# Patient Record
Sex: Male | Born: 1935 | Race: White | Hispanic: No | Marital: Married | Smoking: Former smoker
Health system: Southern US, Community
[De-identification: ages and names within clinical notes are randomized; demographics above are authoritative.]

## PROBLEM LIST (undated history)

## (undated) DIAGNOSIS — J449 Chronic obstructive pulmonary disease, unspecified: Secondary | ICD-10-CM

## (undated) DIAGNOSIS — I872 Venous insufficiency (chronic) (peripheral): Secondary | ICD-10-CM

## (undated) DIAGNOSIS — I5189 Other ill-defined heart diseases: Secondary | ICD-10-CM

## (undated) DIAGNOSIS — M1990 Unspecified osteoarthritis, unspecified site: Secondary | ICD-10-CM

## (undated) DIAGNOSIS — K635 Polyp of colon: Secondary | ICD-10-CM

## (undated) DIAGNOSIS — R7611 Nonspecific reaction to tuberculin skin test without active tuberculosis: Secondary | ICD-10-CM

## (undated) DIAGNOSIS — K922 Gastrointestinal hemorrhage, unspecified: Secondary | ICD-10-CM

## (undated) DIAGNOSIS — J209 Acute bronchitis, unspecified: Secondary | ICD-10-CM

## (undated) DIAGNOSIS — G4733 Obstructive sleep apnea (adult) (pediatric): Secondary | ICD-10-CM

## (undated) DIAGNOSIS — I4892 Unspecified atrial flutter: Secondary | ICD-10-CM

## (undated) DIAGNOSIS — K219 Gastro-esophageal reflux disease without esophagitis: Secondary | ICD-10-CM

## (undated) DIAGNOSIS — K2950 Unspecified chronic gastritis without bleeding: Secondary | ICD-10-CM

## (undated) DIAGNOSIS — D649 Anemia, unspecified: Secondary | ICD-10-CM

## (undated) DIAGNOSIS — K5730 Diverticulosis of large intestine without perforation or abscess without bleeding: Secondary | ICD-10-CM

## (undated) DIAGNOSIS — I10 Essential (primary) hypertension: Secondary | ICD-10-CM

## (undated) DIAGNOSIS — D126 Benign neoplasm of colon, unspecified: Secondary | ICD-10-CM

## (undated) DIAGNOSIS — R0602 Shortness of breath: Secondary | ICD-10-CM

## (undated) DIAGNOSIS — I4891 Unspecified atrial fibrillation: Secondary | ICD-10-CM

## (undated) DIAGNOSIS — E039 Hypothyroidism, unspecified: Secondary | ICD-10-CM

## (undated) DIAGNOSIS — I351 Nonrheumatic aortic (valve) insufficiency: Secondary | ICD-10-CM

## (undated) HISTORY — DX: Acute bronchitis, unspecified: J20.9

## (undated) HISTORY — PX: TOTAL KNEE ARTHROPLASTY: SHX125

## (undated) HISTORY — PX: NASAL SINUS SURGERY: SHX719

## (undated) HISTORY — DX: Unspecified chronic gastritis without bleeding: K29.50

## (undated) HISTORY — DX: Anemia, unspecified: D64.9

## (undated) HISTORY — DX: Nonrheumatic aortic (valve) insufficiency: I35.1

## (undated) HISTORY — DX: Unspecified osteoarthritis, unspecified site: M19.90

## (undated) HISTORY — DX: Obstructive sleep apnea (adult) (pediatric): G47.33

## (undated) HISTORY — DX: Benign neoplasm of colon, unspecified: D12.6

## (undated) HISTORY — DX: Polyp of colon: K63.5

## (undated) HISTORY — PX: COLONOSCOPY: SHX174

## (undated) HISTORY — DX: Nonspecific reaction to tuberculin skin test without active tuberculosis: R76.11

## (undated) HISTORY — DX: Gastrointestinal hemorrhage, unspecified: K92.2

## (undated) HISTORY — DX: Gastro-esophageal reflux disease without esophagitis: K21.9

## (undated) HISTORY — DX: Essential (primary) hypertension: I10

## (undated) HISTORY — DX: Venous insufficiency (chronic) (peripheral): I87.2

## (undated) HISTORY — DX: Hypothyroidism, unspecified: E03.9

## (undated) HISTORY — DX: Unspecified atrial fibrillation: I48.91

## (undated) HISTORY — PX: HERNIA REPAIR: SHX51

## (undated) HISTORY — DX: Other ill-defined heart diseases: I51.89

## (undated) HISTORY — DX: Diverticulosis of large intestine without perforation or abscess without bleeding: K57.30

## (undated) HISTORY — PX: SHOULDER ARTHROSCOPY: SHX128

## (undated) HISTORY — DX: Unspecified atrial flutter: I48.92

## (undated) HISTORY — PX: TONSILLECTOMY: SHX5217

## (undated) HISTORY — PX: POLYPECTOMY: SHX149

## (undated) HISTORY — PX: TOE SURGERY: SHX1073

---

## 1997-08-25 ENCOUNTER — Ambulatory Visit (HOSPITAL_COMMUNITY): Admission: RE | Admit: 1997-08-25 | Discharge: 1997-08-25 | Payer: Self-pay | Admitting: Internal Medicine

## 1998-09-18 ENCOUNTER — Encounter: Payer: Self-pay | Admitting: Orthopedic Surgery

## 1998-09-19 ENCOUNTER — Encounter: Payer: Self-pay | Admitting: Orthopedic Surgery

## 1998-09-20 ENCOUNTER — Encounter: Payer: Self-pay | Admitting: Orthopedic Surgery

## 1998-09-20 ENCOUNTER — Inpatient Hospital Stay (HOSPITAL_COMMUNITY): Admission: RE | Admit: 1998-09-20 | Discharge: 1998-09-25 | Payer: Self-pay | Admitting: Orthopedic Surgery

## 1998-10-29 ENCOUNTER — Encounter: Admission: RE | Admit: 1998-10-29 | Discharge: 1998-12-17 | Payer: Self-pay | Admitting: Orthopedic Surgery

## 1999-06-24 HISTORY — PX: JOINT REPLACEMENT: SHX530

## 1999-08-09 ENCOUNTER — Encounter: Payer: Self-pay | Admitting: Orthopedic Surgery

## 1999-08-09 ENCOUNTER — Inpatient Hospital Stay (HOSPITAL_COMMUNITY): Admission: RE | Admit: 1999-08-09 | Discharge: 1999-08-14 | Payer: Self-pay | Admitting: Orthopedic Surgery

## 1999-09-04 ENCOUNTER — Encounter: Admission: RE | Admit: 1999-09-04 | Discharge: 1999-10-24 | Payer: Self-pay | Admitting: Orthopedic Surgery

## 2000-01-02 ENCOUNTER — Ambulatory Visit (HOSPITAL_BASED_OUTPATIENT_CLINIC_OR_DEPARTMENT_OTHER): Admission: RE | Admit: 2000-01-02 | Discharge: 2000-01-02 | Payer: Self-pay | Admitting: Orthopedic Surgery

## 2000-01-23 ENCOUNTER — Encounter: Payer: Self-pay | Admitting: Gastroenterology

## 2000-01-24 ENCOUNTER — Encounter (INDEPENDENT_AMBULATORY_CARE_PROVIDER_SITE_OTHER): Payer: Self-pay | Admitting: Specialist

## 2000-01-24 ENCOUNTER — Other Ambulatory Visit: Admission: RE | Admit: 2000-01-24 | Discharge: 2000-01-24 | Payer: Self-pay | Admitting: Gastroenterology

## 2000-11-20 ENCOUNTER — Encounter: Payer: Self-pay | Admitting: Internal Medicine

## 2000-11-20 ENCOUNTER — Ambulatory Visit (HOSPITAL_COMMUNITY): Admission: RE | Admit: 2000-11-20 | Discharge: 2000-11-20 | Payer: Self-pay | Admitting: Internal Medicine

## 2001-06-23 DIAGNOSIS — K635 Polyp of colon: Secondary | ICD-10-CM

## 2001-06-23 HISTORY — DX: Polyp of colon: K63.5

## 2001-12-31 ENCOUNTER — Encounter: Payer: Self-pay | Admitting: Urology

## 2002-01-07 ENCOUNTER — Inpatient Hospital Stay (HOSPITAL_COMMUNITY): Admission: RE | Admit: 2002-01-07 | Discharge: 2002-01-09 | Payer: Self-pay | Admitting: Urology

## 2002-01-07 ENCOUNTER — Encounter (INDEPENDENT_AMBULATORY_CARE_PROVIDER_SITE_OTHER): Payer: Self-pay | Admitting: Specialist

## 2002-02-16 ENCOUNTER — Encounter: Payer: Self-pay | Admitting: Gastroenterology

## 2002-04-05 ENCOUNTER — Encounter: Payer: Self-pay | Admitting: Gastroenterology

## 2002-04-05 ENCOUNTER — Ambulatory Visit (HOSPITAL_COMMUNITY): Admission: RE | Admit: 2002-04-05 | Discharge: 2002-04-05 | Payer: Self-pay | Admitting: Gastroenterology

## 2002-12-09 ENCOUNTER — Encounter: Payer: Self-pay | Admitting: Internal Medicine

## 2002-12-09 ENCOUNTER — Ambulatory Visit (HOSPITAL_COMMUNITY): Admission: RE | Admit: 2002-12-09 | Discharge: 2002-12-09 | Payer: Self-pay | Admitting: Internal Medicine

## 2003-01-10 ENCOUNTER — Encounter (INDEPENDENT_AMBULATORY_CARE_PROVIDER_SITE_OTHER): Payer: Self-pay | Admitting: Specialist

## 2003-01-10 ENCOUNTER — Ambulatory Visit (HOSPITAL_COMMUNITY): Admission: RE | Admit: 2003-01-10 | Discharge: 2003-01-10 | Payer: Self-pay | Admitting: Surgery

## 2003-07-03 ENCOUNTER — Encounter: Admission: RE | Admit: 2003-07-03 | Discharge: 2003-07-03 | Payer: Self-pay | Admitting: Neurology

## 2003-08-04 ENCOUNTER — Ambulatory Visit (HOSPITAL_COMMUNITY): Admission: RE | Admit: 2003-08-04 | Discharge: 2003-08-04 | Payer: Self-pay | Admitting: Neurology

## 2003-09-11 ENCOUNTER — Ambulatory Visit (HOSPITAL_COMMUNITY): Admission: RE | Admit: 2003-09-11 | Discharge: 2003-09-11 | Payer: Self-pay | Admitting: Internal Medicine

## 2003-09-25 ENCOUNTER — Ambulatory Visit (HOSPITAL_COMMUNITY): Admission: RE | Admit: 2003-09-25 | Discharge: 2003-09-25 | Payer: Self-pay | Admitting: Internal Medicine

## 2004-01-11 ENCOUNTER — Ambulatory Visit (HOSPITAL_COMMUNITY): Admission: RE | Admit: 2004-01-11 | Discharge: 2004-01-11 | Payer: Self-pay | Admitting: Internal Medicine

## 2004-01-17 ENCOUNTER — Inpatient Hospital Stay (HOSPITAL_BASED_OUTPATIENT_CLINIC_OR_DEPARTMENT_OTHER): Admission: RE | Admit: 2004-01-17 | Discharge: 2004-01-17 | Payer: Self-pay | Admitting: Cardiology

## 2004-01-17 HISTORY — PX: CARDIAC CATHETERIZATION: SHX172

## 2004-12-11 ENCOUNTER — Ambulatory Visit (HOSPITAL_COMMUNITY): Admission: RE | Admit: 2004-12-11 | Discharge: 2004-12-11 | Payer: Self-pay | Admitting: Internal Medicine

## 2005-01-21 ENCOUNTER — Ambulatory Visit: Payer: Self-pay | Admitting: Gastroenterology

## 2005-02-25 ENCOUNTER — Ambulatory Visit: Payer: Self-pay | Admitting: Gastroenterology

## 2005-03-04 ENCOUNTER — Ambulatory Visit: Payer: Self-pay | Admitting: Gastroenterology

## 2005-03-04 ENCOUNTER — Encounter (INDEPENDENT_AMBULATORY_CARE_PROVIDER_SITE_OTHER): Payer: Self-pay | Admitting: Specialist

## 2005-03-19 ENCOUNTER — Ambulatory Visit: Payer: Self-pay | Admitting: Gastroenterology

## 2006-07-20 ENCOUNTER — Ambulatory Visit (HOSPITAL_COMMUNITY): Admission: RE | Admit: 2006-07-20 | Discharge: 2006-07-20 | Payer: Self-pay | Admitting: Internal Medicine

## 2006-09-28 ENCOUNTER — Ambulatory Visit (HOSPITAL_COMMUNITY): Admission: RE | Admit: 2006-09-28 | Discharge: 2006-09-28 | Payer: Self-pay | Admitting: Internal Medicine

## 2006-10-12 ENCOUNTER — Ambulatory Visit (HOSPITAL_COMMUNITY): Admission: RE | Admit: 2006-10-12 | Discharge: 2006-10-12 | Payer: Self-pay | Admitting: Internal Medicine

## 2006-11-09 ENCOUNTER — Encounter: Payer: Self-pay | Admitting: Emergency Medicine

## 2008-08-08 ENCOUNTER — Encounter: Admission: RE | Admit: 2008-08-08 | Discharge: 2008-08-08 | Payer: Self-pay | Admitting: Otolaryngology

## 2008-09-01 ENCOUNTER — Ambulatory Visit (HOSPITAL_COMMUNITY): Admission: RE | Admit: 2008-09-01 | Discharge: 2008-09-01 | Payer: Self-pay | Admitting: Otolaryngology

## 2008-09-01 ENCOUNTER — Encounter: Admission: RE | Admit: 2008-09-01 | Discharge: 2008-09-01 | Payer: Self-pay | Admitting: Otolaryngology

## 2008-09-08 DIAGNOSIS — K219 Gastro-esophageal reflux disease without esophagitis: Secondary | ICD-10-CM

## 2008-09-08 DIAGNOSIS — N138 Other obstructive and reflux uropathy: Secondary | ICD-10-CM | POA: Insufficient documentation

## 2008-09-08 DIAGNOSIS — I10 Essential (primary) hypertension: Secondary | ICD-10-CM

## 2008-09-08 DIAGNOSIS — N401 Enlarged prostate with lower urinary tract symptoms: Secondary | ICD-10-CM

## 2008-09-11 ENCOUNTER — Encounter: Payer: Self-pay | Admitting: Internal Medicine

## 2008-09-11 ENCOUNTER — Ambulatory Visit: Payer: Self-pay

## 2008-09-11 ENCOUNTER — Ambulatory Visit: Payer: Self-pay | Admitting: Internal Medicine

## 2008-09-11 DIAGNOSIS — I4891 Unspecified atrial fibrillation: Secondary | ICD-10-CM

## 2008-10-17 ENCOUNTER — Encounter (INDEPENDENT_AMBULATORY_CARE_PROVIDER_SITE_OTHER): Payer: Self-pay | Admitting: Otolaryngology

## 2008-10-17 ENCOUNTER — Ambulatory Visit (HOSPITAL_BASED_OUTPATIENT_CLINIC_OR_DEPARTMENT_OTHER): Admission: RE | Admit: 2008-10-17 | Discharge: 2008-10-18 | Payer: Self-pay | Admitting: Otolaryngology

## 2008-11-10 ENCOUNTER — Ambulatory Visit: Payer: Self-pay | Admitting: Internal Medicine

## 2009-01-12 ENCOUNTER — Ambulatory Visit (HOSPITAL_COMMUNITY): Admission: RE | Admit: 2009-01-12 | Discharge: 2009-01-12 | Payer: Self-pay | Admitting: Internal Medicine

## 2009-02-19 ENCOUNTER — Inpatient Hospital Stay (HOSPITAL_COMMUNITY): Admission: EM | Admit: 2009-02-19 | Discharge: 2009-02-21 | Payer: Self-pay | Admitting: Emergency Medicine

## 2009-02-19 ENCOUNTER — Ambulatory Visit: Payer: Self-pay | Admitting: Internal Medicine

## 2009-02-21 ENCOUNTER — Encounter: Payer: Self-pay | Admitting: Gastroenterology

## 2009-02-23 ENCOUNTER — Encounter: Payer: Self-pay | Admitting: Gastroenterology

## 2009-03-14 ENCOUNTER — Encounter (INDEPENDENT_AMBULATORY_CARE_PROVIDER_SITE_OTHER): Payer: Self-pay | Admitting: *Deleted

## 2009-03-15 ENCOUNTER — Ambulatory Visit: Payer: Self-pay | Admitting: Internal Medicine

## 2009-04-19 ENCOUNTER — Ambulatory Visit: Payer: Self-pay | Admitting: Internal Medicine

## 2009-07-17 ENCOUNTER — Telehealth: Payer: Self-pay | Admitting: Internal Medicine

## 2009-07-25 ENCOUNTER — Inpatient Hospital Stay (HOSPITAL_COMMUNITY): Admission: EM | Admit: 2009-07-25 | Discharge: 2009-07-27 | Payer: Self-pay | Admitting: Emergency Medicine

## 2009-07-25 ENCOUNTER — Ambulatory Visit: Payer: Self-pay | Admitting: Internal Medicine

## 2009-08-09 ENCOUNTER — Encounter: Payer: Self-pay | Admitting: Internal Medicine

## 2009-09-10 ENCOUNTER — Ambulatory Visit: Payer: Self-pay | Admitting: Gastroenterology

## 2009-09-10 DIAGNOSIS — Z860100 Personal history of colon polyps, unspecified: Secondary | ICD-10-CM | POA: Insufficient documentation

## 2009-09-10 DIAGNOSIS — K5731 Diverticulosis of large intestine without perforation or abscess with bleeding: Secondary | ICD-10-CM

## 2009-10-04 ENCOUNTER — Ambulatory Visit: Payer: Self-pay | Admitting: Emergency Medicine

## 2009-10-04 ENCOUNTER — Ambulatory Visit: Payer: Self-pay | Admitting: Internal Medicine

## 2009-10-04 DIAGNOSIS — I5032 Chronic diastolic (congestive) heart failure: Secondary | ICD-10-CM

## 2009-10-04 DIAGNOSIS — G4733 Obstructive sleep apnea (adult) (pediatric): Secondary | ICD-10-CM

## 2009-10-04 DIAGNOSIS — J449 Chronic obstructive pulmonary disease, unspecified: Secondary | ICD-10-CM

## 2009-10-08 ENCOUNTER — Encounter: Payer: Self-pay | Admitting: Emergency Medicine

## 2009-10-23 ENCOUNTER — Ambulatory Visit: Payer: Self-pay | Admitting: Cardiology

## 2009-10-23 ENCOUNTER — Encounter: Payer: Self-pay | Admitting: Internal Medicine

## 2009-10-23 ENCOUNTER — Ambulatory Visit: Payer: Self-pay

## 2009-10-23 ENCOUNTER — Ambulatory Visit (HOSPITAL_COMMUNITY): Admission: RE | Admit: 2009-10-23 | Discharge: 2009-10-23 | Payer: Self-pay | Admitting: Internal Medicine

## 2009-11-01 ENCOUNTER — Ambulatory Visit: Payer: Self-pay | Admitting: Emergency Medicine

## 2009-11-01 DIAGNOSIS — J449 Chronic obstructive pulmonary disease, unspecified: Secondary | ICD-10-CM

## 2009-11-12 ENCOUNTER — Telehealth: Payer: Self-pay | Admitting: Emergency Medicine

## 2009-11-29 ENCOUNTER — Ambulatory Visit: Payer: Self-pay | Admitting: Internal Medicine

## 2009-12-11 ENCOUNTER — Encounter: Payer: Self-pay | Admitting: Emergency Medicine

## 2009-12-14 ENCOUNTER — Ambulatory Visit: Payer: Self-pay | Admitting: Emergency Medicine

## 2009-12-17 ENCOUNTER — Encounter: Payer: Self-pay | Admitting: Emergency Medicine

## 2009-12-31 ENCOUNTER — Telehealth (INDEPENDENT_AMBULATORY_CARE_PROVIDER_SITE_OTHER): Payer: Self-pay | Admitting: *Deleted

## 2010-05-28 ENCOUNTER — Ambulatory Visit: Payer: Self-pay | Admitting: Emergency Medicine

## 2010-07-01 ENCOUNTER — Encounter: Payer: Self-pay | Admitting: Internal Medicine

## 2010-07-01 ENCOUNTER — Ambulatory Visit
Admission: RE | Admit: 2010-07-01 | Discharge: 2010-07-01 | Payer: Self-pay | Source: Home / Self Care | Attending: Internal Medicine | Admitting: Internal Medicine

## 2010-07-01 ENCOUNTER — Other Ambulatory Visit: Payer: Self-pay | Admitting: Internal Medicine

## 2010-07-01 LAB — CBC WITH DIFFERENTIAL/PLATELET
Basophils Absolute: 0.1 10*3/uL (ref 0.0–0.1)
Basophils Relative: 0.8 % (ref 0.0–3.0)
Eosinophils Absolute: 0.4 10*3/uL (ref 0.0–0.7)
Eosinophils Relative: 5.3 % — ABNORMAL HIGH (ref 0.0–5.0)
HCT: 42.6 % (ref 39.0–52.0)
Hemoglobin: 14.5 g/dL (ref 13.0–17.0)
Lymphocytes Relative: 19.8 % (ref 12.0–46.0)
Lymphs Abs: 1.5 10*3/uL (ref 0.7–4.0)
MCHC: 34 g/dL (ref 30.0–36.0)
MCV: 92 fl (ref 78.0–100.0)
Monocytes Absolute: 0.8 10*3/uL (ref 0.1–1.0)
Monocytes Relative: 10.1 % (ref 3.0–12.0)
Neutro Abs: 5 10*3/uL (ref 1.4–7.7)
Neutrophils Relative %: 64 % (ref 43.0–77.0)
Platelets: 211 10*3/uL (ref 150.0–400.0)
RBC: 4.63 Mil/uL (ref 4.22–5.81)
RDW: 12.9 % (ref 11.5–14.6)
WBC: 7.7 10*3/uL (ref 4.5–10.5)

## 2010-07-01 LAB — BASIC METABOLIC PANEL
BUN: 19 mg/dL (ref 6–23)
CO2: 28 mEq/L (ref 19–32)
Calcium: 9.3 mg/dL (ref 8.4–10.5)
Chloride: 101 mEq/L (ref 96–112)
Creatinine, Ser: 0.7 mg/dL (ref 0.4–1.5)
GFR: 110.55 mL/min (ref >60.00)
Glucose, Bld: 81 mg/dL (ref 70–99)
Potassium: 4.2 mEq/L (ref 3.5–5.1)
Sodium: 140 mEq/L (ref 135–145)

## 2010-07-13 ENCOUNTER — Encounter: Payer: Self-pay | Admitting: Internal Medicine

## 2010-07-13 ENCOUNTER — Encounter: Payer: Self-pay | Admitting: Neurology

## 2010-07-14 ENCOUNTER — Encounter: Payer: Self-pay | Admitting: Internal Medicine

## 2010-07-21 LAB — CONVERTED CEMR LAB
CO2: 29 meq/L (ref 19–32)
Glucose, Bld: 85 mg/dL (ref 70–99)
Potassium: 4.2 meq/L (ref 3.5–5.1)
Sodium: 138 meq/L (ref 135–145)

## 2010-07-23 NOTE — Miscellaneous (Signed)
Summary: Orders Update  Clinical Lists Changes  Medications: Changed medication from FLUTICASONE PROPIONATE 50 MCG/ACT SUSP (FLUTICASONE PROPIONATE) UAD to FLUTICASONE PROPIONATE 50 MCG/ACT SUSP (FLUTICASONE PROPIONATE) 2 sprays each nostril two times a day - Signed Added new medication of SYMBICORT 160-4.5 MCG/ACT AERO (BUDESONIDE-FORMOTEROL FUMARATE) Rx of FLUTICASONE PROPIONATE 50 MCG/ACT SUSP (FLUTICASONE PROPIONATE) 2 sprays each nostril two times a day;  #3 x 3;  Signed;  Entered by: Michel Bickers CMA;  Authorized by: Leslye Peer MD;  Method used: Printed then faxed to [facility] [place], , , [place]  , Ph: [phone], Fax: [phone] Rx of SYMBICORT 160-4.5 MCG/ACT AERO (BUDESONIDE-FORMOTEROL FUMARATE) 2 puffs two times a day;  #1 x 4;  Signed;  Entered by: Michel Bickers CMA;  Authorized by: Leslye Peer MD;  Method used: Printed then faxed to [facility] [place], , , [place]  , Ph: [phone], Fax: [phone]    Prescriptions: SYMBICORT 160-4.5 MCG/ACT AERO (BUDESONIDE-FORMOTEROL FUMARATE) 2 puffs two times a day  #1 x 4   Entered by:   Michel Bickers CMA   Authorized by:   Leslye Peer MD   Signed by:   Michel Bickers CMA on 12/17/2009   Method used:   Printed then faxed to ...       [facility] [place] (mail-order)             , [place]         Ph: [phone]       Fax: [phone]   RxID:   [id number] FLUTICASONE PROPIONATE 50 MCG/ACT SUSP (FLUTICASONE PROPIONATE) 2 sprays each nostril two times a day  #3 x 3   Entered by:   Michel Bickers CMA   Authorized by:   Leslye Peer MD   Signed by:   Michel Bickers CMA on 12/17/2009   Method used:   Printed then faxed to ...       [facility] [place] (mail-order)             , [place]         Ph: [phone]       Fax: [phone]   RxID:   [id number]

## 2010-07-23 NOTE — Procedures (Signed)
Summary: EGD   EGD  Procedure date:  02/16/2002  Findings:      Findings: Gastritis  Findings: Ulcer  [facility]   Patient Name: Richard Rosales, Richard Rosales. MRN:  Procedure Procedures: Panendoscopy (EGD) CPT: 43235.    with biopsy(s)/brushing(s). CPT: D1846139.  Personnel: Endoscopist: [name]. [name], MD.  Indications  Evaluation of: Anemia,  with low ferritin.  History  Pre-Exam Physical: Performed Feb 16, 2002  Entire physical exam was normal.  Exam Exam Info: Maximum depth of insertion Duodenum, intended Duodenum. Vocal cords visualized. Gastric retroflexion performed. ASA Classification: II. Tolerance: excellent.  Sedation Meds: Residual sedation present from prior procedure today. Robinul 0.2 given IV. Fentanyl given IV. Versed given IV. Cetacaine Spray 1 sprays given aerosolized.  Monitoring: BP and pulse monitoring done. Oximetry used. Supplemental O2 given at 2 Liters.  Findings ULCER: in Body Not bleeding, clear ulcer base. Biopsy/Ulcer taken. ICD9: Ulcer, Gastric, Acute without Hemorrhage: 531.30.  - MUCOSAL ABNORMALITY: Cardia to Antrum. Erythematous mucosa. Edema present. Biopsy/Mucosal Abn taken. ICD9: Gastritis, Acute: 535.00.  ULCER: in Antrum Not bleeding, clear ulcer base. Biopsy/Ulcer taken. ICD9: Ulcer, Gastric, Acute without Hemorrhage: 531.30. Comment: Very superficial ulcers each measuring 10mm with flat surrounding mucosa.   Assessment Abnormal examination, see findings above.  Diagnoses: 531.30: Ulcer, Gastric, Acute without Hemorrhage.  535.00: Gastritis, Acute.   Events  Unplanned Intervention: No unplanned interventions were required.  Unplanned Events: There were no complications. Plans Medication(s): Await pathology. Continue current medications.  Scheduling: Home stool hemocults, around Mar 16, 2002.  Office Visit, to [employer]. [name], MD, around Mar 16, 2002.    This report was created from the original  endoscopy report, which was reviewed and signed by the above listed endoscopist.

## 2010-07-23 NOTE — Assessment & Plan Note (Signed)
Summary: COPD, OSA   Visit Type:  Follow-up Copy to:  Dr. [name] Primary Provider/Referring Provider:  Marlowe Shores, MD  CC:  6 mo COPD / OSA follow-up...patient says his breathing is doing well and he is doing well on cpap.  History of Present Illness: 77 yo man, former smoker, also with hx of HTN, A Fib, chronic LE edema, GERD, diverticulosis, remote positive PPD. OSA on CPAP. Recent GIB in 2/10, Hgb subsequently stable.  Referred for new exertional SOB. Has been most noticable over the last 3 weeks. Was seen by Dr Oneta Rack, started on Advair about 3 weeks ago due to wheezing. He isn't sure that it has helped him in any way. Has had more clear PND, cough, throat irritation. Cough productive of clear phlegm. No hemoptysis. No CP. Wt stable over the last yr. Has not been using NSW's. Tried claritin before without results.   ROV 11/01/09 -- ROV for exertional dyspnea. auto-set CPAP shows he needs 12cmH2O. He is interested in changing his DME provider to [facility]. TTE with preserved LVEF, PASP 34, not able to comment on diastolic fxn. PFT today show. Tells me that his am cough, throat irritation is about the same as last time - started nasal steroid, NSWs.   ROV 12/14/09 -- returns for f/u obstructive lung dz, OSA, allergies. Feels better on the Symbicort started last time. Has been using fluticasone once daily and NSWs once daily with good results. Got a new CPAP mask and it is working very well. No new problems.   ROV 05/28/10 -- COPD, OSA, allergies. Here for f/u. Still with some cough and sneezing, espec around dog hair. Using fluticasone 1 spray each side once daily. On Symbicort for about 7 months, beginning to notice some jitters. He has had this sensation before with prednisone. He definitely notices that breathing is better on the meds. He has good CPAP compliance. Has am cough, difficulty clearing mucous in the am.  Current Medications (verified): 1)  Diltiazem Hcl Er Beads  240 Mg Xr24h-Cap (Diltiazem Hcl Er Beads) .... Two Times A Day 2)  Diovan 320 Mg Tabs (Valsartan) .... Take 1 Tablet By Mouth Once Daily 3)  Furosemide 40 Mg Tabs (Furosemide) .... Take One Tablet By Mouth Daily As Needed 4)  Levoxyl 100 Mcg Tabs (Levothyroxine Sodium) .Marland Kitchen.. 1 By Mouth Once Daily 5)  Minoxidil 10 Mg Tabs (Minoxidil) .... Take 1/2 Tablet Daily 6)  Omeprazole 20 Mg Tbec (Omeprazole) .Marland Kitchen.. 1 By Mouth Once Daily 7)  Provigil 200 Mg Tabs (Modafinil) .... 1/2 Take As Needed 8)  Vitamin B Complex-C   Caps (B Complex-C) .Marland Kitchen.. 1 By Mouth Once Daily 9)  Vitamin D 2000 Unit Tabs (Cholecalciferol) .Marland Kitchen.. 1 By Mouth Daily 10)  Fish Oil 1000 Mg Caps (Omega-3 Fatty Acids) .Marland Kitchen.. 1 By Mouth Two Times A Day 11)  Glucosamine Sulfate 1000 Mg Caps (Glucosamine Sulfate) .... 2 Capsules Daily 12)  Aspir-Low 81 Mg Tbec (Aspirin) .... Take 1 Tablet Three Times Weekly 13)  Guaifenesin 400 Mg Tabs (Guaifenesin) .Marland Kitchen.. 1 By Mouth Two Times A Day 14)  Docusate Sodium 100 Mg Tabs (Docusate Sodium) .... Once Daily 15)  Metamucil Smooth Texture 28 % Pack (Psyllium) .... Twice Daily 16)  Fluticasone Propionate 50 Mcg/act Susp (Fluticasone Propionate) .... 2 Sprays Each Nostril Once Daily 17)  Symbicort 160-4.5 Mcg/act Aero (Budesonide-Formoterol Fumarate) .... 2 Puffs Two Times A Day  Allergies (verified): 1)  ! Prednisone 2)  ! * Advair  Vital Signs:  Patient profile:   77 year old male Height:      70 inches (177.80 cm) Weight:      286.38 pounds (130.17 kg) BMI:     41.24 O2 Sat:      93 % on Room air Temp:     98.4 degrees F (36.89 degrees C) oral Pulse rate:   90 / minute BP sitting:   138 / 78  (right arm) Cuff size:   large  Vitals Entered By: Michel Bickers CMA (May 28, 2010 2:49 PM)  O2 Sat at Rest %:  93 O2 Flow:  Room air CC: 6 mo COPD / OSA follow-up...patient says his breathing is doing well and he is doing well on cpap Comments Medications reviewed with patient Michel Bickers [facility]   May 28, 2010 3:01 PM   Physical Exam  General:  normal appearance, healthy appearing, and obese.   Head:  normocephalic and atraumatic Eyes:  conjunctiva and sclera clear Nose:  no deformity, discharge, inflammation, or lesions Mouth:  no deformity or lesions Neck:  no masses, thyromegaly, or abnormal cervical nodes Lungs:  clear during normal breath, B wheeze on forced exp Heart:  regular, distant, no M Abdomen:  obese, NT, protuberant Msk:  no deformity or scoliosis noted with normal posture Extremities:  2+ pitting edema. Compression hose in place.  Neurologic:  awake, alert, walks with a cane. non-focal Skin:  intact without lesions or rashes Psych:  alert and cooperative; normal mood and affect; normal attention span and concentration   Impression & Recommendations:  Problem # 1:  COPD (ICD-496) Restart nasal saline washes once daily  Decrease Symbicort to 1 puff two times a day  Continue your fluticasone 1 spreay each side once daily  Call our office if your breathing worsens on the decreased Symbicort Follow up with Dr Delton Coombes in 1 year  Problem # 2:  SLEEP APNEA, OBSTRUCTIVE (ICD-327.23)  CPAP  Orders: Est. Patient Level IV (04540)  Problem # 3:  ALLERGIC RHINITIS (ICD-477.9)  Restart nasal saline washes once daily  Continue your fluticasone 1 spreay each side once daily   His updated medication list for this problem includes:    Fluticasone Propionate 50 Mcg/act Susp (Fluticasone propionate) .Marland Kitchen... 2 sprays each nostril once daily  Orders: Est. Patient Level IV (98119)  Medications Added to Medication List This Visit: 1)  Vitamin D 2000 Unit Tabs (Cholecalciferol) .Marland Kitchen.. 1 by mouth daily 2)  Fish Oil 1000 Mg Caps (Omega-3 fatty acids) .Marland Kitchen.. 1 by mouth two times a day 3)  Docusate Sodium 100 Mg Tabs (Docusate sodium) .... Once daily 4)  Fluticasone Propionate 50 Mcg/act Susp (Fluticasone propionate) .... 2 sprays each nostril once daily  Patient  Instructions: 1)  Restart nasal saline washes once daily  2)  Decrease Symbicort to 1 puff two times a day  3)  Continue your fluticasone 1 spreay each side once daily  4)  Continue your CPAP 5)  Call our office if your breathing worsens on the decreased Symbicort 6)  Follow up with Dr Delton Coombes in 1 year   Immunization History:  Influenza Immunization History:    Influenza:  historical (03/23/2010)  Pneumovax Immunization History:    Pneumovax:  historical (05/28/2010)

## 2010-07-23 NOTE — Letter (Signed)
Summary: [facility]  [facility]   Imported By: Lester Dailey 12/18/2009 10:40:59  _____________________________________________________________________  External Attachment:    Type:   Image     Comment:   External Document

## 2010-07-23 NOTE — Miscellaneous (Signed)
Summary: Orders Update pft charges  Clinical Lists Changes  Orders: Added new Service order of Carbon Monoxide diffusing w/capacity (94720) - Signed Added new Service order of Lung Volumes (94240) - Signed Added new Service order of Spirometry (Pre & Post) (94060) - Signed 

## 2010-07-23 NOTE — Progress Notes (Signed)
Summary: rx  Phone Note Call from Patient Call back at Home Phone [phone]   Caller: Patient Call For: [place] Reason for Call: Talk to Nurse Summary of Call: Symbicort - need to be written for a 90 day supply.  #6 w/ 3 refills.  Can you please call in to pharmacy?  Please let pt know when this is done. Does RB want pt to stay on this long? [facility] Initial call taken by: Eugene Gavia,  December 31, 2009 2:52 PM  Follow-up for Phone Call        Rx for symbicort was sent to [facility] (the one that was sent to [facility] outpt pharm was canceled).  Spoke with pt and made aware of this.  He wanted to make sure that RB wanted him to stay on this med and I advised, that yes, per last d/c instructions, he needs to stay on med.  Pt verbalized understanding. Follow-up by: Vernie Murders,  December 31, 2009 3:47 PM    Prescriptions: SYMBICORT 160-4.5 MCG/ACT AERO (BUDESONIDE-FORMOTEROL FUMARATE) 2 puffs two times a day  #3 x 3   Entered by:   Vernie Murders   Authorized by:   Leslye Peer MD   Signed by:   Vernie Murders on 12/31/2009   Method used:   Electronically to        [facility]* (retail)             ,          Ph: [phone]       Fax: [phone]   RxID:   [id number] SYMBICORT 160-4.5 MCG/ACT AERO (BUDESONIDE-FORMOTEROL FUMARATE) 2 puffs two times a day  #3 x 3   Entered by:   Vernie Murders   Authorized by:   Leslye Peer MD   Signed by:   Vernie Murders on 12/31/2009   Method used:   Electronically to        [facility]* (retail)       [address]       [address] Shipping/mailing       [place], [place]  [address]       Ph: [phone]       Fax: [phone]   RxID:   [id number]

## 2010-07-23 NOTE — Assessment & Plan Note (Signed)
Summary: PER CHECK OUT/SF   Visit Type:  Follow-up Referring Provider:  Dr. [name] Primary Provider:  Marlowe Shores, MD   History of Present Illness: The patient presents today for routine electrophysiology followup. His SOB has improved with bronchodilators.  He has stable BLE edema. The patient denies symptoms of palpitations, chest pain, dizziness, presyncope, syncope, or neurologic sequela. The patient is tolerating medications without difficulties and is otherwise without complaint today.   Current Medications (verified): 1)  Diltiazem Hcl Er Beads 240 Mg Xr24h-Cap (Diltiazem Hcl Er Beads) .... Two Times A Day 2)  Diovan 320 Mg Tabs (Valsartan) .... Take 1 Tablet By Mouth Once Daily 3)  Furosemide 40 Mg Tabs (Furosemide) .... Take One Tablet By Mouth Daily As Needed 4)  Levoxyl 100 Mcg Tabs (Levothyroxine Sodium) .Marland Kitchen.. 1 By Mouth Once Daily 5)  Minoxidil 10 Mg Tabs (Minoxidil) .... Take 1/2 Tablet Daily 6)  Omeprazole 20 Mg Tbec (Omeprazole) .Marland Kitchen.. 1 By Mouth Once Daily 7)  Provigil 200 Mg Tabs (Modafinil) .... 1/2 Take As Needed 8)  Vitamin B Complex-C   Caps (B Complex-C) .Marland Kitchen.. 1 By Mouth Once Daily 9)  Vitamin D3 .Marland Kitchen.. 1 By Mouth Two Times A Day 10)  Fish Oil   Oil (Fish Oil) .... Bid 11)  Glucosamine Sulfate 1000 Mg Caps (Glucosamine Sulfate) .... 2 Capsules Daily 12)  Aspir-Low 81 Mg Tbec (Aspirin) .... Take 1 Tablet Three Times Weekly 13)  Guaifenesin 200 Mg Tabs (Guaifenesin) .... Two Times A Day 14)  Ducosate Sod.1000 Md .... Once Daily 15)  Slow Fe 160 (50 Fe) Mg Cr-Tabs (Ferrous Sulfate Dried) .Marland Kitchen.. 1 By Mouth Two Times A Day 16)  Metamucil Smooth Texture 28 % Pack (Psyllium) .... Twice Daily 17)  Fluticasone Propionate 50 Mcg/act Susp (Fluticasone Propionate) .... Uad 18)  Symbicort 160-4.5 Mcg/act Aero (Budesonide-Formoterol Fumarate) .... 2 Puffs Two Times A Day  Allergies (verified): 1)  ! Prednisone 2)  ! * Advair  Past History:  Past Medical  History: Reviewed history from 10/04/2009 and no changes required. 1. hypertension with diastolic dysfxn 2. Persistent atrial fibrillation and typical atrial flutter 3. gastroesophageal reflux disease 4. heart catheterization January 17, 2004 revealed a preserved ejection fraction with no coronary artery disease 5. allergic rhinitis 6. hypothyroidism 7. degenerative joint disease 8. diverticulosis with a GI bleed 20 years ago and 2010 (not on ASA or coumadin at the time) 9. obstructive sleep apnea, compliant with CPAP 10. hyperplastic polyp 2003 11. mild chronic gastritis  2003 12. remote positive PPD  Past Surgical History: Reviewed history from 10/04/2009 and no changes required. Tonsillectomy Bilateral herniorrhaphy Total knee replacement on each side Sinus surgeries Hernia Surgery  Social History: Reviewed history from 10/04/2009 and no changes required. The patient lives in [place]. 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 He quit smoking in 1980. He drinks one glass of wine per day. He denies drug use.  Vital Signs:  Patient profile:   77 year old male Height:      70 inches Weight:      275 pounds BMI:     39.60 O2 Sat:      94 % Pulse rate:   72 / minute BP sitting:   110 / 72  (left arm)  Vitals Entered By: Laurance Flatten CMA (November 29, 2009 12:08 PM)  Physical Exam  General:  obese, NAD Head:  normocephalic and atraumatic Eyes:  PERRLA/EOM  intact; conjunctiva and lids normal. Mouth:  Teeth, gums and palate normal. Oral mucosa normal. Neck:  Neck supple, no JVD. No masses, thyromegaly or abnormal cervical nodes. Lungs:  Clear bilaterally to auscultation and percussion. Heart:  iRRR, no m/r/g Abdomen:  Bowel sounds positive; abdomen soft and non-tender without masses, organomegaly, or hernias noted. No hepatosplenomegaly. Msk:  walks with a cane Pulses:  pulses normal in all 4  extremities Extremities:  1+ BLE edema Neurologic:  Alert and oriented x 3.   Impression & Recommendations:  Problem # 1:  ATRIAL FIBRILLATION (ICD-427.31) The patient has both afib and atrial flutter.  He declines coumadin or pradaxa due to concerns for GI bleeding.  He understands that he has an elevated risk for stroke. He is well rate controlled no changes today  Problem # 2:  ACUTE DIASTOLIC HEART FAILURE (ICD-428.31) salt restriction will order support stockings  Problem # 3:  HYPERTENSION, UNSPECIFIED (ICD-401.9) stable no changes  Patient Instructions: 1)  Your physician wants you to follow-up in:  6 months with Dr Johney Frame. You will receive a reminder letter in the mail two months in advance. If you don't receive a letter, please call our office to schedule the follow-up appointment. 2)  Your physician recommends that you continue on your current medications as directed. Please refer to the Current Medication list given to you today.

## 2010-07-23 NOTE — Assessment & Plan Note (Signed)
Summary: 6 month rov/sl   Referring Provider:  Autumn Patty, MD Primary Provider:  Marlowe Shores, MD  CC:  6 month rov.  Pt has had SOB recently.  However he is seeing a pulmonologist today.  Marland Kitchen  History of Present Illness: The patient presents today for routine electrophysiology followup. He reports progressive SOB over the past few weeks.  He has stable BLE edema.  He has not noticed progressive orthopnea or PND.  He has a cough p/o clear sputum x 2 weeks, worse in the am.  The patient denies symptoms of palpitations, chest pain, dizziness, presyncope, syncope, or neurologic sequela. The patient is tolerating medications without difficulties and is otherwise without complaint today.    Current Medications (verified): 1)  Diltiazem Hcl Er Beads 240 Mg Xr24h-Cap (Diltiazem Hcl Er Beads) .... Two Times A Day 2)  Diovan 320 Mg Tabs (Valsartan) .... Take 1 Tablet By Mouth Once Daily 3)  Furosemide 40 Mg Tabs (Furosemide) .... Take One Tablet By Mouth Daily As Needed 4)  Levoxyl 100 Mcg Tabs (Levothyroxine Sodium) .Marland Kitchen.. 1 By Mouth Once Daily 5)  Minoxidil 10 Mg Tabs (Minoxidil) .... Take 1/2 Tablet Daily 6)  Omeprazole 20 Mg Tbec (Omeprazole) .Marland Kitchen.. 1 By Mouth Once Daily 7)  Provigil 200 Mg Tabs (Modafinil) .... 1/2 Take As Needed 8)  Vitamin B Complex-C   Caps (B Complex-C) .Marland Kitchen.. 1 By Mouth Once Daily 9)  Vitamin D3 .Marland Kitchen.. 1 By Mouth Two Times A Day 10)  Fish Oil   Oil (Fish Oil) .... Bid 11)  Glucosamine Sulfate 1000 Mg Caps (Glucosamine Sulfate) .... 2 Capsules Daily 12)  Aspir-Low 81 Mg Tbec (Aspirin) .... Take 1 Tablet Three Times Weekly 13)  Guaifenesin 200 Mg Tabs (Guaifenesin) .... Two Times A Day 14)  Ducosate Sod.1000 Md .... Once Daily  Allergies (verified): 1)  ! Prednisone 2)  ! * Advair  Past History:  Past Medical History: 1. hypertension 2. Persistent atrial fibrillation and typical atrial flutter 3. gastroesophageal reflux disease 4. heart catheterization January 17, 2004  revealed a preserved ejection fraction with no coronary artery disease 5. allergic rhinitis 6. hypothyroidism 7. degenerative joint disease 8. diverticulosis with a GI bleed 20 years ago and 2010 (not on ASA or coumadin at the time) 9. obstructive sleep apnea, compliant with CPAP hyperplastic polyp 2003 mild chronic gastritis  2003 diastolic dysfunction  Past Surgical History: Reviewed history from 09/10/2009 and no changes required. Tonsillectomy Bilateral herniorrhaphy Total knee replacement on each side Sinus surgery Hernia Surgery  Social History: Reviewed history from 09/11/2008 and no changes required. The patient lives in [place]. 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 he quit smoking in 1980. He drinks one glass of wine per day. He denies drug use.  Review of Systems       All systems are reviewed and negative except as listed in the HPI.   Vital Signs:  Patient profile:   77 year old male Height:      71 inches Weight:      274 pounds Pulse rate:   60 / minute Pulse rhythm:   regular BP sitting:   136 / 72  (left arm) Cuff size:   large  Vitals Entered By: Judithe Modest CMA (October 04, 2009 9:45 AM)  Physical Exam  General:  morbidly obese, in no acute distress. walks with a cane Head:  normocephalic and atraumatic Eyes:  PERRLA/EOM intact; conjunctiva  and lids normal. Mouth:  Teeth, gums and palate normal. Oral mucosa normal. Neck:  Neck supple, JVP 9cm No masses, thyromegaly or abnormal cervical nodes. Lungs:  coarse BS at the base, no significant rales Heart:  irregularly irregular rhythm, no murmurs rubs or gallops Abdomen:  Bowel sounds positive; abdomen soft and non-tender without masses, organomegaly, or hernias noted. No hepatosplenomegaly. Msk:  Back normal, normal gait. Muscle strength and tone normal. Pulses:  pulses normal in all 4 extremities Extremities:  No clubbing or  cyanosis. chronic venous insufficiency with 1+ lower extremity edema bilaterally Neurologic:  Alert and oriented x 3. Skin:  Intact without lesions or rashes. Psych:  Normal affect.   EKG  Procedure date:  10/04/2009  Findings:      aV rate 60,LAD  Impression & Recommendations:  Problem # 1:  ATRIAL FIBRILLATION (ICD-427.31) The patient has a h/o afib and atrial flutter.  Unfortunately, he has had difficulty with GI bleeding, limiting out ability to anticoagulate.  He presentsly will only take ASA 81mg  three times per week due to concern for bleeding.  Due to our inability to anticoagulate, he is not a candidate for rhythm control at this time. He is presently well rate controlled.  Problem # 2:  ACUTE DIASTOLIC HEART FAILURE (ICD-428.31) The patient has mild volume overload on exam, though his weight is not significantly increased.  I supsect that his SOB is primarily related to bronchitis though CHF may also be playing a role.  He plans to see Dr Delton Coombes later today and may require bronchodilators. I will increase lasix to 40mg  two times a day x 3 days, then return to 40mg  daily/ as needed.  Salt restriction is advised We will repeat an echo (last echo 1 year ago).  We will also check BMET and BNP today.  I have reviewed his prior echo today.  Problem # 3:  HYPERTENSION, UNSPECIFIED (ICD-401.9) stable no changes  His updated medication list for this problem includes:    Diltiazem Hcl Er Beads 240 Mg Xr24h-cap (Diltiazem hcl er beads) .Marland Kitchen..Marland Kitchen Two times a day    Diovan 320 Mg Tabs (Valsartan) .Marland Kitchen... Take 1 tablet by mouth once daily    Furosemide 40 Mg Tabs (Furosemide) .Marland Kitchen... Take one tablet by mouth daily as needed    Minoxidil 10 Mg Tabs (Minoxidil) .Marland Kitchen... Take 1/2 tablet daily    Aspir-low 81 Mg Tbec (Aspirin) .Marland Kitchen... Take 1 tablet three times weekly  Orders: Echocardiogram (Echo) TLB-BMP (Basic Metabolic Panel-BMET) (80048-METABOL) TLB-BNP (B-Natriuretic Peptide)  (83880-BNPR)  Other Orders: EKG w/ Interpretation (93000)  Patient Instructions: 1)  Your physician recommends that you schedule a follow-up appointment in: 6 weeks with Dr Johney Frame 2)  Your physician recommends that you return for lab work today 3)  Your physician has recommended you make the following change in your medication: increase Furosemide to twice daily for three days then go back to daily 4)  Watch Salt intake  5)  Your physician has requested that you have an echocardiogram.  Echocardiography is a painless test that uses sound waves to create images of your heart. It provides your doctor with information about the size and shape of your heart and how well your heart's chambers and valves are working.  This procedure takes approximately one hour. There are no restrictions for this procedure.

## 2010-07-23 NOTE — Progress Notes (Signed)
Summary: refill - diltiazem  Phone Note Refill Request Message from:  Patient on July 17, 2009 9:35 AM  Refills Requested: Medication #1:  DILTIAZEM HCL ER BEADS 240 MG XR24H-CAP two times a day   Supply Requested: 3 months   Notes: 180 tabs [facility] [phone]   Method Requested: Fax to Mail Away Pharmacy Initial call taken by: Migdalia Dk,  July 17, 2009 9:35 AM    Prescriptions: DILTIAZEM HCL ER BEADS 240 MG XR24H-CAP (DILTIAZEM HCL ER BEADS) two times a day  #180 x 3   Entered by:   Hardin Negus, RMA   Authorized by:   Hillis Range, MD   Signed by:   Hardin Negus, RMA on 07/17/2009   Method used:   Electronically to        [facility]* (mail-order)             ,          Ph: [phone]       Fax: [phone]   RxID:   [id number]

## 2010-07-23 NOTE — Assessment & Plan Note (Signed)
Summary: COPD, OSA, allergies   Visit Type:  Follow-up Copy to:  Dr. [name] Primary Provider/Referring Provider:  Marlowe Shores, MD  CC:  Dyspnea.  PFT's today.  SOB with exertion/walking.  Patient says his new CPAP mask leaks.Marland Kitchen  History of Present Illness: 77 yo man, former smoker, also with hx of HTN, A Fib, chronic LE edema, GERD, diverticulosis, remote positive PPD. OSA on CPAP. Recent GIB in 2/10, Hgb subsequently stable.  Referred for new exertional SOB. Has been most noticable over the last 3 weeks. Was seen by Dr Oneta Rack, started on Advair about 3 weeks ago due to wheezing. He isn't sure that it has helped him in any way. Has had more clear PND, cough, throat irritation. Cough productive of clear phlegm. No hemoptysis. No CP. Wt stable over the last yr. Has not been using NSW's. Tried claritin before without results.   ROV 11/01/09 -- ROV for exertional dyspnea. auto-set CPAP shows he needs 12cmH2O. He is interested in changing his DME provider to [facility]. TTE with preserved LVEF, PASP 34, not able to comment on diastolic fxn. PFT today show. Tells me that his am cough, throat irritation is about the same as last time - started nasal steroid, NSWs.   Current Medications (verified): 1)  Diltiazem Hcl Er Beads 240 Mg Xr24h-Cap (Diltiazem Hcl Er Beads) .... Two Times A Day 2)  Diovan 320 Mg Tabs (Valsartan) .... Take 1 Tablet By Mouth Once Daily 3)  Furosemide 40 Mg Tabs (Furosemide) .... Take One Tablet By Mouth Daily As Needed 4)  Levoxyl 100 Mcg Tabs (Levothyroxine Sodium) .Marland Kitchen.. 1 By Mouth Once Daily 5)  Minoxidil 10 Mg Tabs (Minoxidil) .... Take 1/2 Tablet Daily 6)  Omeprazole 20 Mg Tbec (Omeprazole) .Marland Kitchen.. 1 By Mouth Once Daily 7)  Provigil 200 Mg Tabs (Modafinil) .... 1/2 Take As Needed 8)  Vitamin B Complex-C   Caps (B Complex-C) .Marland Kitchen.. 1 By Mouth Once Daily 9)  Vitamin D3 .Marland Kitchen.. 1 By Mouth Two Times A Day 10)  Fish Oil   Oil (Fish Oil) .... Bid 11)  Glucosamine  Sulfate 1000 Mg Caps (Glucosamine Sulfate) .... 2 Capsules Daily 12)  Aspir-Low 81 Mg Tbec (Aspirin) .... Take 1 Tablet Three Times Weekly 13)  Guaifenesin 200 Mg Tabs (Guaifenesin) .... Two Times A Day 14)  Ducosate Sod.1000 Md .... Once Daily 15)  Slow Fe 160 (50 Fe) Mg Cr-Tabs (Ferrous Sulfate Dried) .Marland Kitchen.. 1 By Mouth Two Times A Day 16)  Metamucil Smooth Texture 28 % Pack (Psyllium) .... Twice Daily 17)  Nasacort Aq 55 Mcg/act Aers (Triamcinolone Acetonide(Nasal)) .... 2 Sprays Each Nostril Once Daily  Allergies (verified): 1)  ! Prednisone 2)  ! * Advair  Vital Signs:  Patient profile:   77 year old male Height:      70 inches (177.80 cm) Weight:      279 pounds (126.82 kg) BMI:     40.18 O2 Sat:      93 % on Room air Temp:     98.3 degrees F (36.83 degrees C) oral Pulse rate:   94 / minute BP sitting:   132 / 82  (right arm) Cuff size:   large  Vitals Entered By: Michel Bickers CMA (Nov 01, 2009 12:01 PM)  O2 Sat at Rest %:  93 O2 Flow:  Room air CC: Dyspnea.  PFT's today.  SOB with exertion/walking.  Patient says his new CPAP mask leaks.   Physical Exam  General:  normal appearance,  healthy appearing, and obese.   Head:  normocephalic and atraumatic Eyes:  conjunctiva and sclera clear Nose:  no deformity, discharge, inflammation, or lesions Mouth:  no deformity or lesions Neck:  no masses, thyromegaly, or abnormal cervical nodes Lungs:  focal low-pitched exp wheeze on the L, the R is clear Heart:  regular, distant, no M Abdomen:  obese, NT, protuberant Msk:  no deformity or scoliosis noted with normal posture Extremities:  2+ pitting edema. Compression hose in place.  Neurologic:  awake, alert, walks with a cane. non-focal Skin:  intact without lesions or rashes Psych:  alert and cooperative; normal mood and affect; normal attention span and concentration   Impression & Recommendations:  Problem # 1:  COPD (ICD-496) PFTs consistent with COPD + BD response. Advair  gave hime UA irritation. Discussed trial of Symbicort vs Spiriva. - trial Symbicort - rov 6 weeks to see if he benefits  Problem # 2:  ALLERGIC RHINITIS (ICD-477.9) - restart nasal steroid, change to fluticasone  Problem # 3:  SLEEP APNEA, OBSTRUCTIVE (ICD-327.23) - [facility] DME company [facility] at his request - order CPAP 12cmH2O  Medications Added to Medication List This Visit: 1)  Fluticasone Propionate 50 Mcg/act Susp (Fluticasone propionate) .... 2 sprays each side two times a day 2)  Symbicort 160-4.5 Mcg/act Aero (Budesonide-formoterol fumarate) .... 2 puffs two times a day  Other Orders: Est. Patient Level IV (51884) DME Referral (DME)  Patient Instructions: 1)  We will try Symbicort 160/4.47mcg, 2 puffs two times a day  2)  Continue your nasal saline washes 3)  Use fluticasone 2 sprays each nostril two times a day during allergy season.  4)  We will change your DME company to [facility]. Your CPAP will be set at 12 cmH2O 5)  Follow up with Dr Delton Coombes in 6 weeks.  Prescriptions: SYMBICORT 160-4.5 MCG/ACT AERO (BUDESONIDE-FORMOTEROL FUMARATE) 2 puffs two times a day  #1 x 4   Entered and Authorized by:   Leslye Peer MD   Signed by:   Leslye Peer MD on 11/01/2009   Method used:   Electronically to        [facility]* (retail)       [address]       [place], [place]  [id number]       Ph: [phone]       Fax: [phone]   RxID:   [id number] FLUTICASONE PROPIONATE 50 MCG/ACT SUSP (FLUTICASONE PROPIONATE) 2 sprays each side two times a day  #1 x 5   Entered and Authorized by:   Leslye Peer MD   Signed by:   Leslye Peer MD on 11/01/2009   Method used:   Electronically to        [facility]* (retail)       [address]       [place], [place]  [id number]       Ph: [phone]       Fax: [phone]   RxID:   [id number]

## 2010-07-23 NOTE — Assessment & Plan Note (Signed)
Summary: hosp f.u ...em   History of Present Illness Visit Type: Initial Visit Primary GI MD: Melvia Heaps MD [facility] Primary Provider: Marlowe Shores, MD Chief Complaint: Rectal bleed History of Present Illness:   [name] has returned following a recent hospitalization for acute diverticular bleed.  He had a diverticular bleed in September, 2010 and early [place]mb[place], 2011.  Prior to that he bled about 12 years ago.  Since discharge about a month ago he has had no further bleeding.  His main complaint is constipation.  He had been taking Metamucil but was placed on a low fiber diet at discharge.  On daily MiraLax he has had at least 2 episodes of bowel incontinence.   GI Review of Systems    Reports acid reflux.      Denies abdominal pain, belching, bloating, chest pain, dysphagia with liquids, dysphagia with solids, heartburn, loss of appetite, nausea, vomiting, vomiting blood, weight loss, and  weight gain.      Reports change in bowel habits, constipation, diverticulosis, and  rectal bleeding.     Denies anal fissure, black tarry stools, diarrhea, fecal incontinence, heme positive stool, hemorrhoids, irritable bowel syndrome, jaundice, light color stool, liver problems, and  rectal pain.    Current Medications (verified): 1)  Diltiazem Hcl Er Beads 240 Mg Xr24h-Cap (Diltiazem Hcl Er Beads) .... Two Times A Day 2)  Diovan 320 Mg Tabs (Valsartan) .... Take 1 Tablet By Mouth Once Daily 3)  Furosemide 40 Mg Tabs (Furosemide) .... Take One Tablet By Mouth Daily As Needed 4)  Levoxyl 100 Mcg Tabs (Levothyroxine Sodium) .Marland Kitchen.. 1 By Mouth Once Daily 5)  Minoxidil 10 Mg Tabs (Minoxidil) .... Take 1/2 Tablet Daily 6)  Omeprazole 20 Mg Tbec (Omeprazole) .Marland Kitchen.. 1 By Mouth Once Daily 7)  Provigil 200 Mg Tabs (Modafinil) .... 1/2 Take As Needed 8)  Vitamin B Complex-C   Caps (B Complex-C) .Marland Kitchen.. 1 By Mouth Once Daily 9)  Vitamin D3 .Marland Kitchen.. 1 By Mouth Two Times A Day 10)  Fish Oil   Oil (Fish Oil) ....  Bid 11)  Glucosamine Sulfate   Powd (Glucosamine Sulfate) .... One Tablet Per Day. 12)  Aspir-Low 81 Mg Tbec (Aspirin) .... Take 1 Tablet Three Times Weekly 13)  Guaifenesin 200 Mg Tabs (Guaifenesin) .... Two Times A Day 14)  Ducosate Sod.1000 Md .... Once Daily  Allergies (verified): No Known Drug Allergies  Past History:  Past Medical History: Reviewed history from 09/06/2009 and no changes required. 1. hypertension 2. Persistent atrial fibrillation and typical atrial flutter 3. gastroesophageal reflux disease 4. heart catheterization January 17, 2004 revealed a preserved ejection fraction with no coronary artery disease 5. allergic rhinitis 6. hypothyroidism 7. degenerative joint disease 8. diverticulosis with a GI bleed 20 years ago and 2010 (not on ASA or coumadin at the time) 9. obstructive sleep apnea, compliant with CPAP hyperplastic polyp 2003 mild chronic gastritis  2003  Past Surgical History: Tonsillectomy Bilateral herniorrhaphy Total knee replacement on each side Sinus surgery Hernia Surgery  Family History: Reviewed history from 09/08/2008 and no changes required. Positive for hypertension and diabetes  Social History: Reviewed history from 09/11/2008 and no changes required. The patient lives in [place]. 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 he quit smoking in 1980. He drinks one glass of wine per day. He denies drug use.  Review of Systems       The patient complains of allergy/sinus, arthritis/joint  pain, back pain, heart murmur, and heart rhythm changes.  The patient denies anemia, anxiety-new, blood in urine, breast changes/lumps, change in vision, confusion, cough, coughing up blood, depression-new, fainting, fatigue, fever, headaches-new, hearing problems, itching, menstrual pain, muscle pains/cramps, night sweats, nosebleeds, pregnancy symptoms, shortness of breath, skin rash,  sleeping problems, sore throat, swelling of feet/legs, swollen lymph glands, thirst - excessive , urination - excessive , urination changes/pain, urine leakage, vision changes, and voice change.    Vital Signs:  Patient profile:   77 year old male Height:      71 inches Weight:      275.13 pounds BMI:     38.51 Pulse rate:   64 / minute Pulse rhythm:   irregular BP sitting:   120 / 68  (left arm) Cuff size:   large  Vitals Entered By: June McMurray CMA Duncan Dull) (September 10, 2009 11:40 AM)   Impression & Recommendations:  Problem # 1:  DIVERTICULOSIS OF COLON WITH HEMORRHAGE (ICD-562.12) The patient has had 3 diverticular bleeds, 2 in the last 6 months.  At last colonoscopy in September, 2010 he had diffuse diverticulosis from the sigmoid colon to the descending colon.  Adenomatous colon polyps were removed as well.  Mentation is #1 expected management for recurrent bleeding.  If this continues to recur we would have to consider a subtotal colectomy  Problem # 2:  PERSONAL HISTORY OF COLONIC POLYPS (ICD-V12.72) Plan followup colonoscopy in 2015  Problem # 3:  CONSTIPATION (ICD-564.00) Plan to discontinue MiraLax and to restart Metamucil daily.  Patient Instructions: 1)  The medication list was reviewed and reconciled.  All changed / newly prescribed medications were explained.  A complete medication list was provided to the patient / caregiver. 2)  cc [name], [name]

## 2010-07-23 NOTE — Progress Notes (Signed)
Summary: nose sprays  Phone Note Call from Patient Call back at Home Phone [phone]   Caller: Patient Call For: Juliahna Wiswell Reason for Call: Refill Medication, Talk to Nurse Summary of Call: pt wants to know if he is suppose to take one over the other of Nasacort and Fluticasone.  If he is only suppose to use one, which one?  and he also needs a refill.  Both are still on his med list as active meds.  Also wants to know how long he is suppose to take these? [facility] pharmacy  Initial call taken by: Eugene Gavia,  Nov 12, 2009 3:15 PM  Follow-up for Phone Call        per last ov note RB wanted to change to fluticasone spray and pt is to use during allergy season. RX was sent to [facility] on 11/01/09, verified with pharmacy and they have received the rx. pt aware.Carron Curie CMA  Nov 12, 2009 3:26 PM

## 2010-07-23 NOTE — Procedures (Signed)
Summary: Colonoscopy/[facility]  [facility]/ [facility]   Imported By: Sherian Rein 09/11/2009 07:10:33  _____________________________________________________________________  External Attachment:    Type:   Image     Comment:   External Document

## 2010-07-23 NOTE — Assessment & Plan Note (Signed)
Summary: COPD, OSA, allergies   Visit Type:  Follow-up Copy to:  Dr. [name] Primary Provider/Referring Provider:  Marlowe Shores, MD  CC:  COPD. OSA. The patient says his breathing has improved on Symbicort. New CPAP mask works well.Marland Kitchen  History of Present Illness: 77 yo man, former smoker, also with hx of HTN, A Fib, chronic LE edema, GERD, diverticulosis, remote positive PPD. OSA on CPAP. Recent GIB in 2/10, Hgb subsequently stable.  Referred for new exertional SOB. Has been most noticable over the last 3 weeks. Was seen by Dr Oneta Rack, started on Advair about 3 weeks ago due to wheezing. He isn't sure that it has helped him in any way. Has had more clear PND, cough, throat irritation. Cough productive of clear phlegm. No hemoptysis. No CP. Wt stable over the last yr. Has not been using NSW's. Tried claritin before without results.   ROV 11/01/09 -- ROV for exertional dyspnea. auto-set CPAP shows he needs 12cmH2O. He is interested in changing his DME provider to [facility]. TTE with preserved LVEF, PASP 34, not able to comment on diastolic fxn. PFT today show. Tells me that his am cough, throat irritation is about the same as last time - started nasal steroid, NSWs.   ROV 12/14/09 -- returns for f/u obstructive lung dz, OSA, allergies. Feels better on the Symbicort started last time. Has been using fluticasone once daily and NSWs once daily with good results. Got a new CPAP mask and it is working very well. No new problems.   Preventive Screening-Counseling & Management  Alcohol-Tobacco     Smoking Status: quit     Smoke Cessation Stage: quit     Tobacco Counseling: to remain off tobacco products  Current Medications (verified): 1)  Diltiazem Hcl Er Beads 240 Mg Xr24h-Cap (Diltiazem Hcl Er Beads) .... Two Times A Day 2)  Diovan 320 Mg Tabs (Valsartan) .... Take 1 Tablet By Mouth Once Daily 3)  Furosemide 40 Mg Tabs (Furosemide) .... Take One Tablet By Mouth Daily As Needed 4)   Levoxyl 100 Mcg Tabs (Levothyroxine Sodium) .Marland Kitchen.. 1 By Mouth Once Daily 5)  Minoxidil 10 Mg Tabs (Minoxidil) .... Take 1/2 Tablet Daily 6)  Omeprazole 20 Mg Tbec (Omeprazole) .Marland Kitchen.. 1 By Mouth Once Daily 7)  Provigil 200 Mg Tabs (Modafinil) .... 1/2 Take As Needed 8)  Vitamin B Complex-C   Caps (B Complex-C) .Marland Kitchen.. 1 By Mouth Once Daily 9)  Vitamin D3 .Marland Kitchen.. 1 By Mouth Two Times A Day 10)  Fish Oil   Oil (Fish Oil) .... Bid 11)  Glucosamine Sulfate 1000 Mg Caps (Glucosamine Sulfate) .... 2 Capsules Daily 12)  Aspir-Low 81 Mg Tbec (Aspirin) .... Take 1 Tablet Three Times Weekly 13)  Guaifenesin 400 Mg Tabs (Guaifenesin) .Marland Kitchen.. 1 By Mouth Two Times A Day 14)  Ducosate Sod.1000 Md .... Once Daily 15)  Slow Fe 160 (50 Fe) Mg Cr-Tabs (Ferrous Sulfate Dried) .Marland Kitchen.. 1 By Mouth Two Times A Day 16)  Metamucil Smooth Texture 28 % Pack (Psyllium) .... Twice Daily 17)  Fluticasone Propionate 50 Mcg/act Susp (Fluticasone Propionate) .... Uad 18)  Symbicort 160-4.5 Mcg/act Aero (Budesonide-Formoterol Fumarate) .... 2 Puffs Two Times A Day  Allergies (verified): 1)  ! Prednisone 2)  ! * Advair  Vital Signs:  Patient profile:   77 year old male Height:      70 inches (177.80 cm) Weight:      284 pounds (129.09 kg) BMI:     40.90 O2 Sat:  95 % on Room air Temp:     98.4 degrees F (36.89 degrees C) oral Pulse rate:   62 / minute BP sitting:   124 / 78  (left arm) Cuff size:   large  Vitals Entered By: Michel Bickers CMA (December 14, 2009 1:36 PM)  O2 Sat at Rest %:  95 O2 Flow:  Room air CC: COPD. OSA. The patient says his breathing has improved on Symbicort. New CPAP mask works well. Comments Medications reviewed. Daytime phone verified. Michel Bickers CMA  December 14, 2009 1:37 PM   Physical Exam  General:  normal appearance, healthy appearing, and obese.   Head:  normocephalic and atraumatic Eyes:  conjunctiva and sclera clear Nose:  no deformity, discharge, inflammation, or lesions Mouth:  no  deformity or lesions Neck:  no masses, thyromegaly, or abnormal cervical nodes Lungs:  focal low-pitched exp wheeze on the L, the R is clear Heart:  regular, distant, no M Abdomen:  obese, NT, protuberant Msk:  no deformity or scoliosis noted with normal posture Extremities:  2+ pitting edema. Compression hose in place.  Neurologic:  awake, alert, walks with a cane. non-focal Skin:  intact without lesions or rashes Psych:  alert and cooperative; normal mood and affect; normal attention span and concentration   Impression & Recommendations:  Problem # 1:  COPD (ICD-496) Better on the symbicort 160, will send script to for mail order  Problem # 2:  ALLERGIC RHINITIS (ICD-477.9)  NSW + fluticasone His updated medication list for this problem includes:    Fluticasone Propionate 50 Mcg/act Susp (Fluticasone propionate) ..... Uad  Orders: Est. Patient Level IV (16109)  Problem # 3:  SLEEP APNEA, OBSTRUCTIVE (ICD-327.23)  CPAP 12 cmH2O  Orders: Est. Patient Level IV (60454)  Medications Added to Medication List This Visit: 1)  Guaifenesin 400 Mg Tabs (Guaifenesin) .Marland Kitchen.. 1 by mouth two times a day  Patient Instructions: 1)  Please continue your Symbicort two times a day 2)  Continue your nasal saline washes once daily and fluticasone once daily  3)  Continue your CPAP at bedtime  4)  Follow up with Dr Delton Coombes in 6 months or as needed

## 2010-07-23 NOTE — Assessment & Plan Note (Signed)
Summary: dyspnea, OSA, allergies   Visit Type:  Initial Consult Copy to:  Dr. [name] Primary Provider/Referring Provider:  Marlowe Shores, MD  CC:  Pulmonary consult. The patient c/o increased sob with exertion and cough  with mostly clear mucus.  .  History of Present Illness: Richard Rosales, former smoker, also with hx of HTN, A Fib, chronic LE edema, GERD, diverticulosis, remote positive PPD. OSA on CPAP. Recent GIB in 2/10, Hgb subsequently stable.  Referred for new exertional SOB. Has been most noticable over the last 3 weeks. Was seen by Dr Oneta Rack, started on Advair about 3 weeks ago due to wheezing. He isn't sure that it has helped him in any way. Has had more clear PND, cough, throat irritation. Cough productive of clear phlegm. No hemoptysis. No CP. Wt stable over the last yr. Has not been using NSW's. Tried claritin before without results.   Current Medications (verified): 1)  Diltiazem Hcl Er Beads 240 Mg Xr24h-Cap (Diltiazem Hcl Er Beads) .... Two Times A Day 2)  Diovan 320 Mg Tabs (Valsartan) .... Take 1 Tablet By Mouth Once Daily 3)  Furosemide 40 Mg Tabs (Furosemide) .... Take One Tablet By Mouth Daily As Needed 4)  Levoxyl 100 Mcg Tabs (Levothyroxine Sodium) .Marland Kitchen.. 1 By Mouth Once Daily 5)  Minoxidil 10 Mg Tabs (Minoxidil) .... Take 1/2 Tablet Daily 6)  Omeprazole 20 Mg Tbec (Omeprazole) .Marland Kitchen.. 1 By Mouth Once Daily 7)  Provigil 200 Mg Tabs (Modafinil) .... 1/2 Take As Needed 8)  Vitamin B Complex-C   Caps (B Complex-C) .Marland Kitchen.. 1 By Mouth Once Daily 9)  Vitamin D3 .Marland Kitchen.. 1 By Mouth Two Times A Day 10)  Fish Oil   Oil (Fish Oil) .... Bid 11)  Glucosamine Sulfate 1000 Mg Caps (Glucosamine Sulfate) .... 2 Capsules Daily 12)  Aspir-Low 81 Mg Tbec (Aspirin) .... Take 1 Tablet Three Times Weekly 13)  Guaifenesin 200 Mg Tabs (Guaifenesin) .... Two Times A Day 14)  Ducosate Sod.1000 Md .... Once Daily 15)  Slow Fe 160 (50 Fe) Mg Cr-Tabs (Ferrous Sulfate Dried) .Marland Kitchen.. 1 By Mouth Two Times  A Day 16)  Metamucil Smooth Texture 28 % Pack (Psyllium) .... Twice Daily  Allergies (verified): 1)  ! Prednisone 2)  ! * Advair  Past History:  Social History: Last updated: 10/04/2009 The patient lives in [place]. 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 He quit smoking in 1980. He drinks one glass of wine per day. He denies drug use.  Past Medical History: 1. hypertension with diastolic dysfxn 2. Persistent atrial fibrillation and typical atrial flutter 3. gastroesophageal reflux disease 4. heart catheterization January 17, 2004 revealed a preserved ejection fraction with no coronary artery disease 5. allergic rhinitis 6. hypothyroidism 7. degenerative joint disease 8. diverticulosis with a GI bleed 20 years ago and 2010 (not on ASA or coumadin at the time) 9. obstructive sleep apnea, compliant with CPAP 10. hyperplastic polyp 2003 11. mild chronic gastritis  2003 12. remote positive PPD  Past Surgical History: Tonsillectomy Bilateral herniorrhaphy Total knee replacement on each side Sinus surgeries Hernia Surgery  Family History: Positive for hypertension and diabetes Family History Lung Cancer---brother Family History Diabetes---father  Social History: The patient lives in [place]. 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 He quit smoking in 1980. He drinks one glass of wine per day. He denies  drug use.  Review of Systems       The patient complains of shortness of breath with activity, productive cough, nasal congestion/difficulty breathing through nose, hand/feet swelling, and joint stiffness or pain.  The patient denies shortness of breath at rest, non-productive cough, coughing up blood, chest pain, irregular heartbeats, acid heartburn, indigestion, loss of appetite, weight change, abdominal pain, difficulty swallowing, sore  throat, tooth/dental problems, headaches, sneezing, itching, ear ache, anxiety, depression, rash, change in color of mucus, and fever.    Vital Signs:  Patient profile:   Richard year old male Height:      70 inches (177.80 cm) Weight:      279 pounds (126.82 kg) BMI:     40.18 O2 Sat:      93 % on Room air Temp:     97.9 degrees F (36.61 degrees C) oral Pulse rate:   93 / minute BP sitting:   118 / 74  (left arm) Cuff size:   large  Vitals Entered By: Michel Bickers CMA (October 04, 2009 2:03 PM)  O2 Sat at Rest %:  93 O2 Flow:  Room air  Serial Vital Signs/Assessments:  Comments: 3:10 PM Ambulatory Pulse Oximetry  Resting; HR__93___    02 Sat_94____  Lap1 (185 feet)   HR__92___   02 Sat__93___ Lap2 (185 feet)   HR__91___   02 Sat__92___    Lap3 (185 feet)   HR__94___   02 Sat__93___  __x_Test Completed without Difficulty ___Test Stopped due to:  By: Michel Bickers CMA    Physical Exam  General:  normal appearance, healthy appearing, and obese.   Head:  normocephalic and atraumatic Eyes:  conjunctiva and sclera clear Nose:  no deformity, discharge, inflammation, or lesions Mouth:  no deformity or lesions Neck:  no masses, thyromegaly, or abnormal cervical nodes Lungs:  focal low-pitched exp wheeze on the L, the R is clear Heart:  regular, distant, no M Abdomen:  obese, NT, protuberant Msk:  no deformity or scoliosis noted with normal posture Extremities:  2+ pitting edema. Compression hose in place.  Neurologic:  awake, alert, walks with a cane. non-focal Skin:  intact without lesions or rashes Psych:  alert and cooperative; normal mood and affect; normal attention span and concentration   Impression & Recommendations:  Problem # 1:  DYSPNEA (ICD-786.05) With hx tobacco, OSA (? adequately treated). Symptoms may correspond in time with worsening of his allergies. Empiric Advair hasn't been helpful. he has a focal low-pitched wheze on the L, ? clear to me the cause but  this may be related to his symptoms. He does have a remote hx of positive PPD - walking oximetry today - CXR today - full PFTs - hold Advair  Problem # 2:  ALLERGIC RHINITIS (ICD-477.9) It is possible that worsening of his allergies is also related to his dyspnea. Will follow both for improvement on regimen below/.  - restart NSWs - trial of nasacort once daily   Problem # 3:  SLEEP APNEA, OBSTRUCTIVE (ICD-327.23) - autotitration CPAP x 2 weeks, adjust final pressure based on the results  Problem # 4:  ATRIAL FIBRILLATION (ICD-427.31)  His updated medication list for this problem includes:    Aspir-low 81 Mg Tbec (Aspirin) .Marland Kitchen... Take 1 tablet three times weekly  Problem # 5:  ACUTE DIASTOLIC HEART FAILURE (ICD-428.31)  His updated medication list for this problem includes:    Diovan 320 Mg Tabs (Valsartan) .Marland Kitchen... Take 1 tablet by mouth once daily  Furosemide 40 Mg Tabs (Furosemide) .Marland Kitchen... Take one tablet by mouth daily as needed    Aspir-low 81 Mg Tbec (Aspirin) .Marland Kitchen... Take 1 tablet three times weekly  Problem # 6:  HYPERTENSION, UNSPECIFIED (ICD-401.9)  His updated medication list for this problem includes:    Diltiazem Hcl Er Beads 240 Mg Xr24h-cap (Diltiazem hcl er beads) .Marland Kitchen..Marland Kitchen Two times a day    Diovan 320 Mg Tabs (Valsartan) .Marland Kitchen... Take 1 tablet by mouth once daily    Furosemide 40 Mg Tabs (Furosemide) .Marland Kitchen... Take one tablet by mouth daily as needed    Minoxidil 10 Mg Tabs (Minoxidil) .Marland Kitchen... Take 1/2 tablet daily  Problem # 7:  GERD (ICD-530.81)  His updated medication list for this problem includes:    Omeprazole 20 Mg Tbec (Omeprazole) .Marland Kitchen... 1 by mouth once daily  Medications Added to Medication List This Visit: 1)  Slow Fe 160 (50 Fe) Mg Cr-tabs (Ferrous sulfate dried) .Marland Kitchen.. 1 by mouth two times a day 2)  Metamucil Smooth Texture 28 % Pack (Psyllium) .... Twice daily 3)  Nasacort Aq 55 Mcg/act Aers (Triamcinolone acetonide(nasal)) .... 2 sprays each nostril once  daily  Other Orders: Consultation Level IV (99244) T-2 View CXR (71020TC) DME Referral (DME)  Patient Instructions: 1)  Walking oximetry today showed that your oxygen level does not drop when you exert yourself.  2)  We will perform full PFT's on th eday of your follow up visit 3)  Start using nasal saline washes once daily  4)  Start nasacort 2 sprays each nostril once daily  5)  CXR today.  6)  We will perform a CPAP titration for 2 weeks through your CPAP company.  7)  Follow up with Dr Delton Coombes in 1 month or as needed.  Prescriptions: NASACORT AQ 55 MCG/ACT AERS (TRIAMCINOLONE ACETONIDE(NASAL)) 2 sprays each nostril once daily  #1 x 5   Entered and Authorized by:   Leslye Peer MD   Signed by:   Leslye Peer MD on 10/04/2009   Method used:   Electronically to        [facility] (retail)       [address]       [place], [place]  [id number]       Ph: [phone]       Fax: [phone]   RxID:   [id number]    Immunization History:  Influenza Immunization History:    Influenza:  historical (03/23/2009)  Pneumovax Immunization History:    Pneumovax:  historical (05/23/1994)    Pneumovax:  historical (11/22/2001)

## 2010-07-23 NOTE — Procedures (Signed)
Summary: colonoscopy   Colonoscopy  Procedure date:  03/04/2005  Findings:      Results: Polyp.  Results: Hemorrhoids.     Results: Diverticulosis.       Location:  [facility].    Comments:      Repeat colonoscopy in 5 years.  Patient Name: Richard Rosales, Richard Rosales. MRN:  Procedure Procedures: Colonoscopy CPT: [phone].    with Hot Biopsy(s)CPT: [id number].  Personnel: Endoscopist: [name]. [name], MD.  Patient Consent: Procedure, Alternatives, Risks and Benefits discussed, consent obtained, from patient.  Indications  Evaluation of: Positive fecal occult blood test  History  Current Medications: Patient is not currently taking Coumadin.  Pre-Exam Physical: Performed Mar 04, 2005. Cardio-pulmonary exam, HEENT exam , Abdominal exam, Mental status exam WNL.  Exam Exam: Extent of exam reached: Cecum, extent intended: Cecum.  The cecum was identified by IC valve. Colon retroflexion performed. ASA Classification: II. Tolerance: good.  Monitoring: Pulse and BP monitoring, Oximetry used. Supplemental O2 given. at 2 Liters.  Colon Prep Used Miralax for colon prep. Prep results: fair, adequate exam.  Sedation Meds: Patient assessed and found to be appropriate for moderate (conscious) sedation. Sedation was managed by the Endoscopist. Fentanyl 75 mcg. given IV. Versed 10 mg. given IV.  Findings POLYP: Ascending Colon, Maximum size: 3 mm. Procedure:  hot biopsy, ICD9: Colon Polyps: 211.3.  NORMAL EXAM: Cecum.  - DIVERTICULOSIS: Ascending Colon to Sigmoid Colon. ICD9: Diverticulosis: 562.10. Comments: Diffuse diverticulosis.  HEMORRHOIDS: Internal. ICD9: Hemorrhoids, Internal: 455.0.   Assessment Abnormal examination, see findings above.  Diagnoses: 211.3: Colon Polyps.  562.10: Diverticulosis.  455.0: Hemorrhoids, Internal.   Events  Unplanned Interventions: No intervention was required.  Unplanned Events: There were no complications. Plans  Post Exam  Instructions: Post sedation instructions given.  Patient Education: Patient given standard instructions for: Polyps. Diverticulosis. Hemorrhoids.  Disposition: After procedure patient sent to recovery. After recovery patient sent home.  Scheduling/Referral: Home stool hemocults, around Mar 11, 2005.  Colonoscopy, to [name]. [name], MD, If adenoma, around Mar 04, 2010.    This report was created from the original endoscopy report, which was reviewed and signed by the above listed endoscopist.

## 2010-07-25 NOTE — Assessment & Plan Note (Signed)
Summary: per check out/sf   Visit Type:  Follow-up Referring Provider:  Dr. [name] Primary Provider:  Marlowe Shores, MD   History of Present Illness: The patient presents today for routine electrophysiology followup. His SOB has improved with bronchodilators.  He has stable BLE edema. The patient denies symptoms of palpitations, chest pain, dizziness, presyncope, syncope, or neurologic sequela. The patient is tolerating medications without difficulties and is otherwise without complaint today.   Current Medications (verified): 1)  Diltiazem Hcl Er Beads 240 Mg Xr24h-Cap (Diltiazem Hcl Er Beads) .... Two Times A Day 2)  Diovan 320 Mg Tabs (Valsartan) .... Take 1 Tablet By Mouth Once Daily 3)  Furosemide 40 Mg Tabs (Furosemide) .... Take One Tablet By Mouth Daily As Needed 4)  Levoxyl 100 Mcg Tabs (Levothyroxine Sodium) .Marland Kitchen.. 1 By Mouth Once Daily 5)  Minoxidil 10 Mg Tabs (Minoxidil) .... Take 1/2 Tablet Daily 6)  Omeprazole 20 Mg Tbec (Omeprazole) .Marland Kitchen.. 1 By Mouth Once Daily 7)  Provigil 200 Mg Tabs (Modafinil) .... 1/2 Take As Needed 8)  Vitamin B Complex-C   Caps (B Complex-C) .Marland Kitchen.. 1 By Mouth Once Daily 9)  Vitamin D 2000 Unit Tabs (Cholecalciferol) .Marland Kitchen.. 1 By Mouth Daily 10)  Fish Oil 1000 Mg Caps (Omega-3 Fatty Acids) .Marland Kitchen.. 1 By Mouth Two Times A Day 11)  Glucosamine Sulfate 1000 Mg Caps (Glucosamine Sulfate) .... 2 Capsules Daily 12)  Aspir-Low 81 Mg Tbec (Aspirin) .... Take 1 Tablet Three Times Weekly 13)  Guaifenesin 400 Mg Tabs (Guaifenesin) .Marland Kitchen.. 1 By Mouth Two Times A Day 14)  Docusate Sodium 100 Mg Tabs (Docusate Sodium) .... Once Daily 15)  Metamucil Smooth Texture 28 % Pack (Psyllium) .... Twice Daily 16)  Fluticasone Propionate 50 Mcg/act Susp (Fluticasone Propionate) .... 2 Sprays Each Nostril Once Daily 17)  Symbicort 160-4.5 Mcg/act Aero (Budesonide-Formoterol Fumarate) .... 2 Puffs Two Times A Day  Allergies: 1)  ! Prednisone 2)  ! * Advair  Past History:  Past  Medical History: Reviewed history from 10/04/2009 and no changes required. 1. hypertension with diastolic dysfxn 2. Persistent atrial fibrillation and typical atrial flutter 3. gastroesophageal reflux disease 4. heart catheterization January 17, 2004 revealed a preserved ejection fraction with no coronary artery disease 5. allergic rhinitis 6. hypothyroidism 7. degenerative joint disease 8. diverticulosis with a GI bleed 20 years ago and 2010 (not on ASA or coumadin at the time) 9. obstructive sleep apnea, compliant with CPAP 10. hyperplastic polyp 2003 11. mild chronic gastritis  2003 12. remote positive PPD  Past Surgical History: Reviewed history from 10/04/2009 and no changes required. Tonsillectomy Bilateral herniorrhaphy Total knee replacement on each side Sinus surgeries Hernia Surgery  Social History: Reviewed history from 10/04/2009 and no changes required. The patient lives in [place]. 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 He quit smoking in 1980. He drinks one glass of wine per day. He denies drug use.  Review of Systems       All systems are reviewed and negative except as listed in the HPI.   Vital Signs:  Patient profile:   77 year old male Height:      70 inches Weight:      284 pounds BMI:     40.90 Pulse rate:   82 / minute BP sitting:   140 / 80  (left arm)  Vitals Entered By: Laurance Flatten CMA (July 01, 2010 10:01 AM)  Physical Exam  General:  normal appearance, healthy appearing, and obese.   Head:  normocephalic and atraumatic Eyes:  conjunctiva and sclera clear Mouth:  no deformity or lesions Neck:  no masses, thyromegaly, or abnormal cervical nodes Lungs:  clear during normal breath, B wheeze on forced exp Heart:  regular, distant, no M Abdomen:  obese, NT, protuberant Msk:  no deformity or scoliosis noted with normal posture Extremities:  2+ pitting edema.     Neurologic:  awake, alert, walks with a cane. non-focal   EKG  Procedure date:  07/01/2010  Findings:      afib, V rate 80 bpm, IVCD  Impression & Recommendations:  Problem # 1:  ATRIAL FIBRILLATION (ICD-427.31) stable without symptoms, rate controlled he is not a candidate for coumadin due to GI bleeding he is reluctant to even take ASA  Problem # 2:  HYPERTENSION, UNSPECIFIED (ICD-401.9) weight loss and salt restriction advised check BMET today  Problem # 3:  ACUTE DIASTOLIC HEART FAILURE (ICD-428.31) stable check bmet salt restrict  Problem # 4:  DIVERTICULOSIS OF COLON WITH HEMORRHAGE (ICD-562.12) cbc today  Other Orders: TLB-BMP (Basic Metabolic Panel-BMET) (80048-METABOL) TLB-CBC Platelet - w/Differential (85025-CBCD)  Patient Instructions: 1)  Your physician wants you to follow-up in: 6 months with Dr Johney Frame  Bonita Quin will receive a reminder letter in the mail two months in advance. If you don't receive a letter, please call our office to schedule the follow-up appointment.

## 2010-09-11 LAB — CBC
HCT: 30.8 % — ABNORMAL LOW (ref 39.0–52.0)
HCT: 31.6 % — ABNORMAL LOW (ref 39.0–52.0)
HCT: 32.3 % — ABNORMAL LOW (ref 39.0–52.0)
HCT: 32.3 % — ABNORMAL LOW (ref 39.0–52.0)
HCT: 34.8 % — ABNORMAL LOW (ref 39.0–52.0)
HCT: 35.1 % — ABNORMAL LOW (ref 39.0–52.0)
Hemoglobin: 10.3 g/dL — ABNORMAL LOW (ref 13.0–17.0)
Hemoglobin: 10.3 g/dL — ABNORMAL LOW (ref 13.0–17.0)
Hemoglobin: 10.6 g/dL — ABNORMAL LOW (ref 13.0–17.0)
Hemoglobin: 10.8 g/dL — ABNORMAL LOW (ref 13.0–17.0)
Hemoglobin: 11.6 g/dL — ABNORMAL LOW (ref 13.0–17.0)
Hemoglobin: 11.8 g/dL — ABNORMAL LOW (ref 13.0–17.0)
MCHC: 33 g/dL (ref 30.0–36.0)
MCHC: 33.5 g/dL (ref 30.0–36.0)
MCHC: 33.6 g/dL (ref 30.0–36.0)
MCV: 85.7 fL (ref 78.0–100.0)
MCV: 86.4 fL (ref 78.0–100.0)
MCV: 86.5 fL (ref 78.0–100.0)
Platelets: 189 10*3/uL (ref 150–400)
Platelets: 194 10*3/uL (ref 150–400)
Platelets: 198 10*3/uL (ref 150–400)
RBC: 3.53 MIL/uL — ABNORMAL LOW (ref 4.22–5.81)
RBC: 3.57 MIL/uL — ABNORMAL LOW (ref 4.22–5.81)
RBC: 3.76 MIL/uL — ABNORMAL LOW (ref 4.22–5.81)
RBC: 4.02 MIL/uL — ABNORMAL LOW (ref 4.22–5.81)
RDW: 13.4 % (ref 11.5–15.5)
RDW: 14.2 % (ref 11.5–15.5)
RDW: 14.4 % (ref 11.5–15.5)
RDW: 14.4 % (ref 11.5–15.5)
RDW: 14.6 % (ref 11.5–15.5)
RDW: 14.9 % (ref 11.5–15.5)
WBC: 6.5 10*3/uL (ref 4.0–10.5)
WBC: 6.8 10*3/uL (ref 4.0–10.5)
WBC: 7.8 10*3/uL (ref 4.0–10.5)
WBC: 8.4 10*3/uL (ref 4.0–10.5)

## 2010-09-11 LAB — COMPREHENSIVE METABOLIC PANEL
ALT: 14 U/L (ref 0–53)
BUN: 15 mg/dL (ref 6–23)
CO2: 27 mEq/L (ref 19–32)
Calcium: 8.3 mg/dL — ABNORMAL LOW (ref 8.4–10.5)
GFR calc non Af Amer: >60 mL/min (ref >60)
Glucose, Bld: 93 mg/dL (ref 70–99)
Sodium: 138 mEq/L (ref 135–145)
Total Protein: 5.7 g/dL — ABNORMAL LOW (ref 6.0–8.3)

## 2010-09-11 LAB — BASIC METABOLIC PANEL
BUN: 6 mg/dL (ref 6–23)
CO2: 26 mEq/L (ref 19–32)
Calcium: 8.4 mg/dL (ref 8.4–10.5)
GFR calc non Af Amer: >60 mL/min (ref >60)
Glucose, Bld: 89 mg/dL (ref 70–99)
Glucose, Bld: 94 mg/dL (ref 70–99)
Potassium: 4 mEq/L (ref 3.5–5.1)
Sodium: 134 mEq/L — ABNORMAL LOW (ref 135–145)

## 2010-09-11 LAB — PROTIME-INR: INR: 1.2 (ref 0.00–1.49)

## 2010-09-11 LAB — APTT: aPTT: 28 seconds (ref 24–37)

## 2010-09-27 LAB — CBC
HCT: 37.2 % — ABNORMAL LOW (ref 39.0–52.0)
Hemoglobin: 12.6 g/dL — ABNORMAL LOW (ref 13.0–17.0)
MCHC: 33.8 g/dL (ref 30.0–36.0)
MCV: 90.5 fL (ref 78.0–100.0)
RBC: 4.11 MIL/uL — ABNORMAL LOW (ref 4.22–5.81)

## 2010-09-28 LAB — BASIC METABOLIC PANEL
CO2: 26 mEq/L (ref 19–32)
Calcium: 8.9 mg/dL (ref 8.4–10.5)
Glucose, Bld: 109 mg/dL — ABNORMAL HIGH (ref 70–99)
Sodium: 138 mEq/L (ref 135–145)

## 2010-09-28 LAB — CBC
HCT: 36.2 % — ABNORMAL LOW (ref 39.0–52.0)
Hemoglobin: 12.3 g/dL — ABNORMAL LOW (ref 13.0–17.0)
Hemoglobin: 13.2 g/dL (ref 13.0–17.0)
MCHC: 33.6 g/dL (ref 30.0–36.0)
RBC: 3.99 MIL/uL — ABNORMAL LOW (ref 4.22–5.81)
RDW: 13 % (ref 11.5–15.5)
WBC: 8 10*3/uL (ref 4.0–10.5)

## 2010-09-28 LAB — DIFFERENTIAL
Basophils Absolute: 0 10*3/uL (ref 0.0–0.1)
Basophils Relative: 1 % (ref 0–1)
Eosinophils Relative: 5 % (ref 0–5)
Monocytes Absolute: 0.8 10*3/uL (ref 0.1–1.0)
Neutro Abs: 5.4 10*3/uL (ref 1.7–7.7)

## 2010-09-28 LAB — PREPARE RBC (CROSSMATCH)

## 2010-09-28 LAB — COMPREHENSIVE METABOLIC PANEL
ALT: 19 U/L (ref 0–53)
Alkaline Phosphatase: 57 U/L (ref 39–117)
BUN: 11 mg/dL (ref 6–23)
Chloride: 104 mEq/L (ref 96–112)
Glucose, Bld: 90 mg/dL (ref 70–99)
Potassium: 3.7 mEq/L (ref 3.5–5.1)
Total Bilirubin: 0.5 mg/dL (ref 0.3–1.2)

## 2010-09-28 LAB — TSH: TSH: 2.564 u[IU]/mL (ref 0.350–4.500)

## 2010-09-28 LAB — TYPE AND SCREEN

## 2010-09-28 LAB — APTT: aPTT: 25 seconds (ref 24–37)

## 2010-10-02 LAB — BASIC METABOLIC PANEL
BUN: 16 mg/dL (ref 6–23)
CO2: 28 mEq/L (ref 19–32)
Calcium: 8.9 mg/dL (ref 8.4–10.5)
Creatinine, Ser: 0.81 mg/dL (ref 0.4–1.5)
GFR calc Af Amer: >60 mL/min (ref >60)

## 2010-10-02 LAB — POCT HEMOGLOBIN-HEMACUE: Hemoglobin: 14.7 g/dL (ref 13.0–17.0)

## 2010-10-03 ENCOUNTER — Inpatient Hospital Stay (HOSPITAL_COMMUNITY)
Admission: EM | Admit: 2010-10-03 | Discharge: 2010-10-05 | DRG: 378 | Disposition: A | Discharge status: Home / Self Care | Payer: Medicare Other | Attending: Internal Medicine | Admitting: Internal Medicine

## 2010-10-03 ENCOUNTER — Telehealth: Payer: Self-pay | Admitting: Internal Medicine

## 2010-10-03 DIAGNOSIS — J45909 Unspecified asthma, uncomplicated: Secondary | ICD-10-CM | POA: Diagnosis present

## 2010-10-03 DIAGNOSIS — I4891 Unspecified atrial fibrillation: Secondary | ICD-10-CM | POA: Diagnosis present

## 2010-10-03 DIAGNOSIS — E039 Hypothyroidism, unspecified: Secondary | ICD-10-CM | POA: Diagnosis present

## 2010-10-03 DIAGNOSIS — I10 Essential (primary) hypertension: Secondary | ICD-10-CM | POA: Diagnosis present

## 2010-10-03 DIAGNOSIS — K5731 Diverticulosis of large intestine without perforation or abscess with bleeding: Principal | ICD-10-CM | POA: Diagnosis present

## 2010-10-03 DIAGNOSIS — D62 Acute posthemorrhagic anemia: Secondary | ICD-10-CM | POA: Diagnosis present

## 2010-10-03 DIAGNOSIS — G4733 Obstructive sleep apnea (adult) (pediatric): Secondary | ICD-10-CM | POA: Diagnosis present

## 2010-10-03 LAB — DIFFERENTIAL
Basophils Absolute: 0.1 K/uL (ref 0.0–0.1)
Basophils Relative: 1 % (ref 0–1)
Eosinophils Absolute: 0.6 K/uL (ref 0.0–0.7)
Eosinophils Relative: 6 % — ABNORMAL HIGH (ref 0–5)
Lymphocytes Relative: 24 % (ref 12–46)
Lymphs Abs: 2.5 10*3/uL (ref 0.7–4.0)
Monocytes Absolute: 1.1 K/uL — ABNORMAL HIGH (ref 0.1–1.0)
Monocytes Relative: 10 % (ref 3–12)
Neutro Abs: 6.2 K/uL (ref 1.7–7.7)
Neutrophils Relative %: 59 % (ref 43–77)

## 2010-10-03 LAB — URINALYSIS, ROUTINE W REFLEX MICROSCOPIC
Bilirubin Urine: NEGATIVE
Glucose, UA: NEGATIVE mg/dL
Hgb urine dipstick: NEGATIVE
Ketones, ur: NEGATIVE mg/dL
Nitrite: NEGATIVE
Protein, ur: NEGATIVE mg/dL
Specific Gravity, Urine: 1.017 (ref 1.005–1.030)
Urobilinogen, UA: 0.2 mg/dL (ref 0.0–1.0)
pH: 5.5 (ref 5.0–8.0)

## 2010-10-03 LAB — CBC
HCT: 38.6 % — ABNORMAL LOW (ref 39.0–52.0)
Hemoglobin: 12.9 g/dL — ABNORMAL LOW (ref 13.0–17.0)
MCH: 30.2 pg (ref 26.0–34.0)
MCHC: 33.4 g/dL (ref 30.0–36.0)
MCV: 90.4 fL (ref 78.0–100.0)
Platelets: 209 K/uL (ref 150–400)
RBC: 4.27 MIL/uL (ref 4.22–5.81)
RDW: 13 % (ref 11.5–15.5)
WBC: 10.4 10*3/uL (ref 4.0–10.5)

## 2010-10-03 LAB — COMPREHENSIVE METABOLIC PANEL WITH GFR
ALT: 19 U/L (ref 0–53)
Alkaline Phosphatase: 72 U/L (ref 39–117)
Chloride: 103 meq/L (ref 96–112)
Glucose, Bld: 96 mg/dL (ref 70–99)
Potassium: 4.3 meq/L (ref 3.5–5.1)
Sodium: 138 meq/L (ref 135–145)
Total Bilirubin: 0.4 mg/dL (ref 0.3–1.2)
Total Protein: 6.2 g/dL (ref 6.0–8.3)

## 2010-10-03 LAB — APTT: aPTT: 27 seconds (ref 24–37)

## 2010-10-03 LAB — COMPREHENSIVE METABOLIC PANEL
AST: 21 U/L (ref 0–37)
Albumin: 3.5 g/dL (ref 3.5–5.2)
BUN: 18 mg/dL (ref 6–23)
CO2: 27 mEq/L (ref 19–32)
Calcium: 8.5 mg/dL (ref 8.4–10.5)
Creatinine, Ser: 0.88 mg/dL (ref 0.4–1.5)
GFR calc Af Amer: >60 mL/min (ref >60)
GFR calc non Af Amer: >60 mL/min (ref >60)

## 2010-10-03 LAB — SAMPLE TO BLOOD BANK

## 2010-10-03 LAB — BASIC METABOLIC PANEL
BUN: 21 mg/dL (ref 6–23)
CO2: 26 mEq/L (ref 19–32)
Calcium: 8.8 mg/dL (ref 8.4–10.5)
Creatinine, Ser: 0.69 mg/dL (ref 0.4–1.5)
GFR calc Af Amer: >60 mL/min (ref >60)

## 2010-10-03 LAB — PROTIME-INR
INR: 1.11 (ref 0.00–1.49)
Prothrombin Time: 14.5 seconds (ref 11.6–15.2)

## 2010-10-03 LAB — OCCULT BLOOD, POC DEVICE: Fecal Occult Bld: POSITIVE

## 2010-10-03 NOTE — Telephone Encounter (Signed)
Old record reviewed. Hx recurrent diverticular bleeding. Last colon 02-2009. Dr. Oneta Rack PCP. Last Admit by hospitalists.Told to go to hospital. Wife will take him to [facility]. Anticipate admission by hospitalists. GI available prn.

## 2010-10-04 DIAGNOSIS — K5730 Diverticulosis of large intestine without perforation or abscess without bleeding: Secondary | ICD-10-CM

## 2010-10-04 DIAGNOSIS — K922 Gastrointestinal hemorrhage, unspecified: Secondary | ICD-10-CM

## 2010-10-04 DIAGNOSIS — D62 Acute posthemorrhagic anemia: Secondary | ICD-10-CM

## 2010-10-04 LAB — CBC
HCT: 32.1 % — ABNORMAL LOW (ref 39.0–52.0)
Hemoglobin: 10.8 g/dL — ABNORMAL LOW (ref 13.0–17.0)
Hemoglobin: 9.8 g/dL — ABNORMAL LOW (ref 13.0–17.0)
MCHC: 33.4 g/dL (ref 30.0–36.0)
MCV: 90.4 fL (ref 78.0–100.0)
MCV: 90.4 fL (ref 78.0–100.0)
Platelets: 187 10*3/uL (ref 150–400)
RBC: 3.55 MIL/uL — ABNORMAL LOW (ref 4.22–5.81)
RBC: 3.35 MIL/uL — ABNORMAL LOW (ref 4.22–5.81)
RDW: 13.1 % (ref 11.5–15.5)
WBC: 8.9 10*3/uL (ref 4.0–10.5)
WBC: 8.3 10*3/uL (ref 4.0–10.5)
WBC: 9.8 10*3/uL (ref 4.0–10.5)

## 2010-10-04 LAB — CARDIAC PANEL(CRET KIN+CKTOT+MB+TROPI)
CK, MB: 2.2 ng/mL (ref 0.3–4.0)
Relative Index: 1.9 (ref 0.0–2.5)
Relative Index: INVALID (ref 0.0–2.5)
Relative Index: INVALID (ref 0.0–2.5)
Total CK: 114 U/L (ref 7–232)
Total CK: 85 U/L (ref 7–232)
Troponin I: 0.03 ng/mL (ref 0.00–0.06)
Troponin I: 0.03 ng/mL (ref 0.00–0.06)

## 2010-10-04 LAB — COMPREHENSIVE METABOLIC PANEL
ALT: 14 U/L (ref 0–53)
AST: 24 U/L (ref 0–37)
Albumin: 3.1 g/dL — ABNORMAL LOW (ref 3.5–5.2)
Alkaline Phosphatase: 47 U/L (ref 39–117)
Calcium: 7.9 mg/dL — ABNORMAL LOW (ref 8.4–10.5)
GFR calc Af Amer: >60 mL/min (ref >60)
Potassium: 4.3 mEq/L (ref 3.5–5.1)
Sodium: 139 mEq/L (ref 135–145)
Total Protein: 5 g/dL — ABNORMAL LOW (ref 6.0–8.3)

## 2010-10-04 LAB — DIFFERENTIAL
Eosinophils Relative: 5 % (ref 0–5)
Lymphocytes Relative: 24 % (ref 12–46)
Lymphs Abs: 2.2 10*3/uL (ref 0.7–4.0)
Neutro Abs: 5.2 10*3/uL (ref 1.7–7.7)

## 2010-10-04 NOTE — H&P (Signed)
NAME:  Richard Rosales, Richard Rosales NO.:  [id number]  MEDICAL RECORD NO.:  [id number]           PATIENT TYPE:  E  LOCATION:  WLED                         FACILITY:  [facility]  PHYSICIAN:  Talmage Nap, MD  DATE OF BIRTH:  1930/12/15  DATE OF ADMISSION:  10/03/2010 DATE OF DISCHARGE:                             HISTORY & PHYSICAL   PRIMARY CARE PHYSICIAN:  [name], [name]  PRIMARY GASTROENTEROLOGY:  [name]. [name], MD, [facility]  History obtainable from the patient and the patient's spouse.  CHIEF COMPLAINT:  Painless rectal bleed, restarted in the evening of October 03, 2010.  HISTORY:  The patient is a 77 year old Caucasian male with history of atrial fibrillation, not anticoagulated; hypertension; and chronic edema of the lower extremities, questionable lymphedema; presenting to the emergency room with sudden onset of painless rectal bleed, which started at home.  The patient claimed that he had about 2 to 3 episodes of the painless rectal bleed at home.  He denied any history of abdominal pain. He denied any history of tenesmus.  He denied any chest pain or shortness of breath.  He also denied any systemic symptoms.  No fever, no chills, no rigor.  He subsequently presented to the emergency room. In the ED, the patient had 5 more episodes of painless rectal bleed and this time around the patient claimed he was lightheaded.  After evaluating the patient, he was advised to be admitted for further evaluation.  PAST MEDICAL HISTORY: 1. Positive for atrial fibrillation. 2. GERD. 3. Hypertension. 4. Hypothyroidism. 5. Chronic edema of lower extremities, questionable lymphedema.  PAST SURGICAL HISTORY: 1. Bilateral knee replacement. 2. Cardiac catheterization with normal coronaries. 3. Colonoscopy status post polypectomy about a year ago.  PREADMISSION MEDICATIONS:  Preadmission meds without dosages include: 1. Cardizem. 2. Diovan. 3. Fluticasone furoate. 4.  Furosemide. 5. Amoxil. 6. Minoxidil. 7. Omeprazole. 8. Provigil. 9. Symbicort.  ALLERGIES:  No known allergies.  SOCIAL HISTORY:  Negative for tobacco use.  Occasionally takes alcohol. The patient is retired.  FAMILY HISTORY:  Negative for any cardiac disease or GI malignancy.  REVIEW OF SYSTEMS:  Initially the patient complained about lightheadedness, but this has resolved following IV fluid.  Denies any chest pain or shortness of breath.  No nausea or vomiting.  No fever, no chills, no rigor, no cough, no abdominal discomfort.  Has had 5 episodes of hematochezia in the emergency room.  Has swelling of the lower extremities.  No intolerance to heat or cold.  No neuropsychiatric disorder.  PHYSICAL EXAMINATION:  GENERAL:  On examination, elderly man, not in any obvious respiratory distress with suboptimal hydration. VITAL SIGNS:  Blood pressure 145/83, pulse is 95, respiratory rate 20, temperature is 98.2. HEENT:  Mild pallor, but pupils are reactive to light and extraocular muscles are intact. NECK:  No jugular venous distention.  No carotid bruit.  No lymphadenopathy. CHEST:  Clear to auscultation. HEART:  Heart sounds are S1 and S2. ABDOMEN:  Soft, nontender.  Liver, spleen, and kidney not palpable. Bowel sounds are positive. EXTREMITIES:  Show pedal edema. NEUROLOGIC EXAM:  Nonfocal. MUSCULOSKELETAL SYSTEM:  Showed arthritic changes  in the knees and in the feet. NEUROPSYCHIATRIC:  Evaluation is unremarkable.  LABORATORY DATA:  Initial hematological indices showed WBC of 10.4, hemoglobin of 12.9, hematocrit of 39.6, MCV of 90.4 with a platelet count of 209, normal differential.  Urinalysis unremarkable. Coagulation profile showed PT 14.5, INR 1.11, and APTT of 27.  Chemistry shows sodium of 138, potassium of 4.3, chloride of 102 with a bicarb of 27, glucose is 96, BUN is 18, creatinine 0.88.  LFTs normal.  IMPRESSION: 1. Painless lower gastrointestinal  bleed,?Diverticular bleed. 2. Dehydration. 3. Atrial fibrillation. 4. Hypertension. 5. Gastroesophageal reflux disease. 6. Hypothyroidism. 7. Chronic edema of lower extremities, questionable lymphedema.  PLAN:  Admit the patient to general medical floor.  The patient will be slowly hydrated with half normal saline IV to go at rate of 60 cc an hour.  He will be on Protonix 40 mg IV q.12, diltiazem 240 mg p.o. daily, Diovan 80 mg p.o. daily.  Same dose of synthroid will be restarted as soon as medication reconciliation is done by the pharmacy tech. He will also be on SCD boots for DVT prophylaxis.  Further labs to be ordered on this patient will include cardiac enzymes q.6 x3, H and H q.6 hourly.  CBC, CMP and magnesium will be repeated in a.m.  GI physician, Dr. [name], will be consulted in a.m. for further evaluation of this patient.  The patient will be followed and evaluated on day-to-day basis.     Talmage Nap, MD     CN/MEDQ  D:  10/04/2010  T:  10/04/2010  Job:  [id number]  Electronically Signed by Talmage Nap  on 10/04/2010 04:20:14 AM

## 2010-10-05 LAB — BASIC METABOLIC PANEL
CO2: 26 mEq/L (ref 19–32)
Calcium: 8.5 mg/dL (ref 8.4–10.5)
Chloride: 106 mEq/L (ref 96–112)
Glucose, Bld: 92 mg/dL (ref 70–99)
Potassium: 4.2 mEq/L (ref 3.5–5.1)
Sodium: 138 mEq/L (ref 135–145)

## 2010-10-05 LAB — CBC
HCT: 30.1 % — ABNORMAL LOW (ref 39.0–52.0)
HCT: 29.8 % — ABNORMAL LOW (ref 39.0–52.0)
Hemoglobin: 10.1 g/dL — ABNORMAL LOW (ref 13.0–17.0)
Hemoglobin: 10.2 g/dL — ABNORMAL LOW (ref 13.0–17.0)
MCH: 31 pg (ref 26.0–34.0)
MCHC: 33.6 g/dL (ref 30.0–36.0)
MCHC: 34.2 g/dL (ref 30.0–36.0)
MCV: 90.6 fL (ref 78.0–100.0)

## 2010-10-07 NOTE — Discharge Summary (Signed)
NAME:  Richard Rosales, Richard Rosales NO.:  [id number]  MEDICAL RECORD NO.:  [id number]           PATIENT TYPE:  I  LOCATION:  1537                         FACILITY:  [facility]  PHYSICIAN:  [name], [name]   DATE OF BIRTH:  1931-03-28  DATE OF ADMISSION:  10/03/2010 DATE OF DISCHARGE:                              DISCHARGE SUMMARY   PRIMARY CARE PHYSICIAN:  Richard Rosales, [name]  PRIMARY GASTROENTEROLOGIST:  [name]. Richard Dice, MD, [facility]  DISCHARGE DIAGNOSES: 1. Recurrent diverticular bleed. 2. Acute blood loss anemia secondary to recurrent diverticular bleed. 3. Chronic atrial fibrillation. 4. Gastroesophageal reflux disease. 5. Hypertension. 6. Hypothyroidism. 7. Obstructive sleep apnea. 8. Morbid obesity. 9. Reactive airway disease.  DISCHARGE MEDICATIONS: 1. Symbicort 160/4.5 two puffs b.i.d. 2. Metamucil 1 tablespoon b.i.d. 3. Vitamin D3 1000 units p.o. b.i.d. 4. Omeprazole 20 mg p.o. daily. 5. Multivitamin 1 tablet p.o. daily 6. Levoxyl 100 mcg p.o. daily. 7. Lasix 40 mg p.o. b.i.d. p.r.n. lower extremity swelling. 8. Guaifenesin 400 mg p.o. b.i.d. 9. Glucosamine 2000 mg p.o. daily. 10.Fish oil when 1000 mg p.o. b.i.d. 11.Docusate 100 mg p.o. b.i.d. 12.Fluticasone nasal spray 1 spray intranasally at bedtime. 13.Minoxidil 5 mg p.o. daily. 14.Diovan 320 mg p.o. q.h.s. 15.Diltiazem CD 240 mg p.o. b.i.d.  Note, the patient was also previously taking aspirin and has been instructed to hold this for 2 weeks.  CONSULTATIONS:  Dr. Claudette Rosales of gastroenterology.  BRIEF ADMISSION HPI:  The patient is a 77 year old male with a past medical history of recurrent diverticular bleeding who presented to the hospital with painless rectal bleeding with bright red blood per rectum and some associated presyncopal type symptoms.  Upon initial evaluation in the emergency department, the patient was noted to have mild anemia and Hemoccult positive stool and subsequently  was referred to the hospitalist service for further inpatient evaluation and treatment.  For the full details, please see the dictated report done by Dr. Talmage Nap.  PROCEDURES AND DIAGNOSTIC STUDIES:  Chest x-ray October 05, 2010, showed increased linear opacity at the right lung base representing subsegmental atelectasis.  Mild cardiomegaly.  Chronic bronchitic changes.  DISCHARGE LABORATORY VALUES:  Sodium was 138, potassium 4.2, chloride 106, bicarb 26, BUN 11, creatinine 0.72, glucose 92.  White blood cell count was 8.4, hemoglobin 10.1, hematocrit 30.1, platelets 172.  HOSPITAL COURSE BY PROBLEM: 1. Recurrent diverticular bleeding/acute blood loss anemia:  The     patient was admitted and serial hemoglobin/hematocrit levels were     checked.  He was given IV fluids and monitored closely.  He had an     approximate 4 g drop in his hemoglobin over the course of his     hospital stay and at this time, the patient's hemoglobin and     hematocrit checks have been stable for the past 24 hours.  He has     not had any further complaints of rectal bleeding.  Because of his     known history of recurrent problems with this, GI consultation was     requested and kindly provided by Dr. Claudette Rosales who felt that he  could safely follow up with Dr. Arlyce Rosales as an outpatient.     Recommendations were that the patient avoid aspirin and     nonsteroidal anti-inflammatory medicines for at least 2 weeks which     he has been appraised of.  At this point, we will recheck his     hemoglobin and hematocrit prior to discharge and if it remains     stable, he will be discharged to home. 2. History of atrial fibrillation:  The patient is not a Coumadin     candidate given his recurrent GI bleeds.  He is currently rate     controlled and has been advised to hold his aspirin for 2 weeks. 3. Gastroesophageal reflux disease:  The patient will continue on     proton pump inhibitor therapy. 4.  Hypertension:  The patient has good blood pressure control on the     regimen as outlined above which we will continue. 5. Hypothyroidism:  The patient has been continued on his usual dose     of Synthroid. 6. Obstructive sleep apnea:  The patient brought in his own CPAP     machine which she used at bedtime. 7. Reactive airway disease:  The patient denies that he has any known     history of frank asthma or COPD.  Nevertheless, he is on Symbicort     which was continued during his hospital stay.  DISPOSITION:  The patient is medically stable and will be discharged home later today after his final hemoglobin and hematocrit check is stable.  CONDITION ON DISCHARGE:  Improved.  Time spent coordinating care for discharge and discharge instructions including face-to-face time equals approximately 35 minutes.     [name], [name]     CR/MEDQ  D:  10/05/2010  T:  10/05/2010  Job:  [id number]  cc:   Richard Rosales, [name] Fax: [phone]  [name]. Richard Dice, MD,FACG [address] [place] [address]  Electronically Signed by [name] [name] on 10/07/2010 07:04:25 AM

## 2010-10-25 ENCOUNTER — Ambulatory Visit (INDEPENDENT_AMBULATORY_CARE_PROVIDER_SITE_OTHER): Payer: Medicare Other | Admitting: Adult Health

## 2010-10-25 ENCOUNTER — Encounter: Payer: Self-pay | Admitting: Adult Health

## 2010-10-25 ENCOUNTER — Telehealth: Payer: Self-pay | Admitting: Gastroenterology

## 2010-10-25 VITALS — BP 116/84 | HR 93 | Temp 97.3°F | Ht 70.0 in | Wt 283.8 lb

## 2010-10-25 DIAGNOSIS — J449 Chronic obstructive pulmonary disease, unspecified: Secondary | ICD-10-CM

## 2010-10-25 DIAGNOSIS — K922 Gastrointestinal hemorrhage, unspecified: Secondary | ICD-10-CM

## 2010-10-25 MED ORDER — AMOXICILLIN-POT CLAVULANATE 875-125 MG PO TABS: 1.0000 | ORAL_TABLET | Freq: Two times a day (BID) | ORAL | Status: AC

## 2010-10-25 MED ORDER — LEVALBUTEROL HCL 0.63 MG/3ML IN NEBU
0.6300 mg | INHALATION_SOLUTION | Freq: Once | RESPIRATORY_TRACT | Status: AC
  Administered 2010-10-25: 0.63 mg via RESPIRATORY_TRACT

## 2010-10-25 NOTE — Progress Notes (Signed)
Subjective:    Patient ID: Richard Rosales, male    DOB: 1930/10/11, 77 y.o.   MRN: [id number]  HPI 77 yo man, former smoker, also with hx of HTN, A Fib, chronic LE edema, GERD, diverticulosis, remote positive PPD. OSA on CPAP. Recent GIB in 2/10, Hgb subsequently stable.  Referred for new exertional SOB. Has been most noticable over the last 3 weeks. Was seen by Dr Oneta Rack, started on Advair about 3 weeks ago due to wheezing. He isn't sure that it has helped him in any way. Has had more clear PND, cough, throat irritation. Cough productive of clear phlegm. No hemoptysis. No CP. Wt stable over the last yr. Has not been using NSW's. Tried claritin before without results.   ROV 11/01/09 -- ROV for exertional dyspnea. auto-set CPAP shows he needs 12cmH2O. He is interested in changing his DME provider to [facility]. TTE with preserved LVEF, PASP 34, not able to comment on diastolic fxn. PFT today show. Tells me that his am cough, throat irritation is about the same as last time - started nasal steroid, NSWs.   ROV 12/14/09 -- returns for f/u obstructive lung dz, OSA, allergies. Feels better on the Symbicort started last time. Has been using fluticasone once daily and NSWs once daily with good results. Got a new CPAP mask and it is working very well. No new problems.   ROV 05/28/10 -- COPD, OSA, allergies. Here for f/u. Still with some cough and sneezing, espec around dog hair. Using fluticasone 1 spray each side once daily. On Symbicort for about 7 months, beginning to notice some jitters. He has had this sensation before with prednisone. He definitely notices that breathing is better on the meds. He has good CPAP compliance. Has am cough, difficulty clearing mucous in the am.  10/25/10 Acute OV Pt presents for work in visit. Complains of DOE, wheezing, cough occasionally producing clear mucus x2weeks .  Cough is getting worse esp. At night. Mucus has some yellow mixed with clear. OTC not helping.  Is  taking his symbicort regularly. No fever or hemoptysis . NO increased leg swellling or chest pain. No recent  abx or travel.   Recently discharged from hospital for GIB (10/05/10). Was found to have Acute blood loss anemia secondary to recurrent diverticular bleed.  Has not followed up for his GI appointment.   Review of Systems Constitutional:   No  weight loss, night sweats,  Fevers, chills, + fatigue, or  lassitude.  HEENT:   No headaches,  Difficulty swallowing,  Tooth/dental problems, or  Sore throat,                No sneezing, itching, ear ache, nasal congestion, post nasal drip,   CV:  No chest pain,  Orthopnea, PND, swelling in lower extremities, anasarca, dizziness, palpitations, syncope.   GI  No heartburn, indigestion, abdominal pain, nausea, vomiting, diarrhea, change in bowel habits, loss of appetite, bloody stools.   Resp:   No coughing up of blood.     No wheezing.  No chest wall deformity  Skin: no rash or lesions.  GU: no dysuria, change in color of urine, no urgency or frequency.  No flank pain, no hematuria   MS:  No joint pain or swelling.  No decreased range of motion.  No back pain.  Psych:  No change in mood or affect. No depression or anxiety.  No memory loss.         Objective:   Physical  Exam GEN: A/Ox3; pleasant , NAD, obese   HEENT:  [place]/AT,  EACs-clear, TMs-wnl, NOSE-clear, THROAT-clear, no lesions, no postnasal drip or exudate noted.   NECK:  Supple w/ fair ROM; no JVD; normal carotid impulses w/o bruits; no thyromegaly or nodules palpated; no lymphadenopathy.  RESP  Coarse BS w/ w/o, wheezes/ rales/ or rhonchi.no accessory muscle use, no dullness to percussion  CARD:  RRR, no m/r/g  , no peripheral edema, pulses intact, no cyanosis or clubbing.  GI:   Soft & nt; nml bowel sounds; no organomegaly or masses detected.  Musco: Warm bil, no deformities or joint swelling noted.   Neuro: alert, no focal deficits noted.    Skin: Warm, no lesions  or rashes         Assessment & Plan:

## 2010-10-25 NOTE — Patient Instructions (Addendum)
Augmentin 875mg  Twice daily  For 7 days w/ food Eat yogurt Twice daily while on antibiotics.  Mucinex DM Twice daily  As needed  Cough/congestion  Fluids and rest  follow up Dr Delton Coombes in 4 weeks.  follow up GI Dr. [name] for post hospital .

## 2010-10-25 NOTE — Telephone Encounter (Signed)
Pt scheduled to see Dr. [name] for f/u hospital visit per pulmonary. Pt scheduled for 11/12/10@11 :15am. Rhonda to notify pt of appt date and time.

## 2010-10-25 NOTE — Assessment & Plan Note (Signed)
Refer to gi for post hospital follow up

## 2010-10-25 NOTE — Telephone Encounter (Signed)
LMTCB

## 2010-10-29 ENCOUNTER — Encounter: Payer: Self-pay | Admitting: Adult Health

## 2010-10-29 NOTE — Assessment & Plan Note (Signed)
Exacerbation with bronchitis Plan Augmentin 875mg  Twice daily  For 7 days w/ food Eat yogurt Twice daily while on antibiotics.  Mucinex DM Twice daily  As needed  Cough/congestion  Fluids and rest  follow up Dr Delton Coombes in 4 weeks.

## 2010-11-05 NOTE — H&P (Signed)
NAME:  Richard Rosales, Richard Rosales NO.:  [id number]   MEDICAL RECORD NO.:  [id number]          PATIENT TYPE:  EMS   LOCATION:  ED                           FACILITY:  [facility]   PHYSICIAN:  Peggye Pitt, [name] DATE OF BIRTH:  05/16/1931   DATE OF ADMISSION:  02/19/2009  DATE OF DISCHARGE:                              HISTORY & PHYSICAL   PRIMARY CARE PHYSICIAN:  [name], MD   GASTROENTEROLOGIST:  [name]. [name], MD   CHIEF COMPLAINT:  Melena.   HISTORY OF PRESENT ILLNESS:  Richard Rosales is a very pleasant 77 year old  obese Caucasian gentleman who has a history of a GI bleed 12 years ago  secondary to a diverticular bleed.  He also has a history of atrial  fibrillation not on aspirin or Coumadin because of his GI bleed as well  as hypertension, GERD and BPH.  He went to see his primary care  physician, Richard Rosales, today secondary to 4 episodes of melena starting  last night at around 8:00 p.m.  Initially, his stool was formed with  streaks of blood, and it has progressed to the point where in the ED, it  was witnessed that he had a bowel movement that was pure black tarry  blood clots.  He denies any lightheadedness, dizziness, chest pain,  shortness of breath or any other symptoms.  For this reason, we are  called to the emergency room to admit him for further evaluation and  management.   ALLERGIES:  He has no known drug allergies.   PAST MEDICAL HISTORY:  1. Significant for paroxysmal atrial fibrillation not maintained on      anticoagulation.  2. Hypertension.  3. GERD.  4. BPH.   HOME MEDICATIONS:  Include Synthroid 100 mcg daily, Diovan, minoxidil,  diltiazem, omeprazole, Lasix, all of these at unknown doses.  I have  asked the patient's wife to bring his medication list when she returns  this evening.   SOCIAL HISTORY:  Richard Rosales is married, has two children.  Denies any  alcohol, tobacco or illicit drug use.   FAMILY HISTORY:   Noncontributory.   REVIEW OF SYSTEMS:  Negative except as already mentioned in HPI.   PHYSICAL EXAMINATION:  VITAL SIGNS:  Upon admission, blood pressure  155/98, heart rate 100, respirations 20, O2 saturations 95% on room air  with a temperature of 98.2.  GENERAL:  He is alert, awake, oriented x3, does not appear to be in any  distress.  HEENT:  Normocephalic, atraumatic.  His pupils are equally reactive to  light and accommodation with intact extraocular movements.  NECK:  Supple.  No JVD, no lymphadenopathy, no bruits, no goiter.  HEART:  Regular rate and rhythm with no murmurs, rubs or gallops.  LUNGS:  Clear to auscultation bilaterally.  ABDOMEN:  Soft, nontender, nondistended with positive bowel sounds.  Obese.  EXTREMITIES:  He has about 2+ to 3+ pitting edema bilaterally up to his  knees.  NEUROLOGIC EXAM:  Grossly intact and nonfocal.   LABORATORIES UPON ADMISSION:  Sodium 138, potassium 4.2, chloride 105,  bicarb 26, BUN 17,  creatinine 0.79 with a glucose of 109.  WBCs 8.6,  hemoglobin 13.2, platelet count of 196, an INR of 1.1, and a PTT of 25.   ASSESSMENT AND PLAN:  1. Gastrointestinal bleed.  Given melena, this is likely an upper      source.  He does have a history of a diverticular bleed.  At this      point, we will keep him n.p.o.  We will place 2 large IVs.  Start      him on normal saline replacement.  Will keep n.p.o. in case of      possible  esophagogastroduodenoscopy or colonoscopy.  We will start      twice daily proton pump inhibitor.  I have already contacted Dr.      Lina Sar with [facility] who will see the patient      later today.  2. For his hypertension, we will proceed with holding all of his      antihypertensive medications at this point.  3. For his hypothyroidism, we will check a TSH, and we will place him      on half of his normal Synthroid dose via IV route.  4. For prophylaxis while in the hospital, he will be on Protonix  and      on sequential compression devices for deep venous thrombosis      prophylaxis given his acute gastrointestinal bleed.      Peggye Pitt, [name]  Electronically Signed     EH/MEDQ  D:  02/19/2009  T:  02/19/2009  Job:  [id number]   cc:   [name], [name]  Fax: [phone]   [name]. [name], MD,FACG  [address]  [place]  [place] [address]

## 2010-11-05 NOTE — Op Note (Signed)
NAME:  LADALE, SHERBURN               ACCOUNT NO.:  [id number]   MEDICAL RECORD NO.:  [id number]          PATIENT TYPE:  AMB   LOCATION:  DSC                          FACILITY:  [facility]   PHYSICIAN:  Christopher E. Ezzard Standing, M.D.DATE OF BIRTH:  October 04, 1930   DATE OF PROCEDURE:  10/17/2008  DATE OF DISCHARGE:                               OPERATIVE REPORT   PREOPERATIVE DIAGNOSIS:  Sinonasal polyps with nasal obstruction, the  right side worse than left side.   POSTOPERATIVE DIAGNOSIS:  Sinonasal polyps with nasal obstruction, the  right side worse than left side.   OPERATION PERFORMED:  1. Functional endoscopic sinus surgery with right ethmoidectomy with      removal of sinonasal polyps.  2. Right maxillary ostia enlargement with removal of polyps,  3. Left ethmoidectomy with removal of polyps.  4. Simple bilateral inferior turbinate reductions.   SURGEON:  Kristine Garbe. Ezzard Standing, MD.   ANESTHESIA:  General endotracheal.   COMPLICATIONS:  None.   BRIEF CLINICAL NOTE:  Richard Rosales is a 77 year old gentleman who has  history of obstructive sleep apnea and uses nasal CPAP.  He has had  increasing nasal obstruction.  He has had previous sinus surgery with  removal of polyps approximately 12-13 years ago.  He had recurrence of  his polyps, the right side worse than left side with obstruction of the  posterior nasal cavity on the right side.  He is taken to operating room  at this time for repeat endoscopic surgery with removal of sinonasal  polyps.   DESCRIPTION OF PROCEDURE:  After adequate endotracheal anesthesia, the  patient received 8 mg of Decadron and a gram of Ancef IV preoperatively.  Nose was prepped with cotton pledget soaked in Afrin and middle meatus  area was injected with Xylocaine with epinephrine for hemostasis.  First, the right side was approached.  There was a few small adhesions  between the middle turbinate in the lateral nasal wall, which were  released.  There  was few polyps around the maxillary ostia, anteriorly  superiorly and the anterior superior ethmoid area, which were removed  with a microdebrider and there were larger polyps in the posterior nasal  cavity around the sphenoid sinus opening that protruded within the  ethmoid area lateral to the middle turbinate as well as other polyps  extending medial to the middle turbinate posteriorly.  These polyps were  fairly large and extended down to the nasopharynx.  Ample of these  polyps were grasped with a grasper, removed and sent to pathology.  Then  using a microdebrider, remaining of polypoid tissue was removed back  toward the sphenoid ostia, which was patent.  This completed the surgery  on the right side.  Cotton pledgets soaked in Afrin were placed for  hemostasis.  Next, the left side was approached.  On the left side, the  middle turbinate was mostly missing from previous surgery.  There was a  large polyp within the ethmoid area that was removed with a  microdebrider and a few small polyps in the ethmoid area, which were  debrided with a microdebrider.  The maxillary ostia was widely patent  and no really polypoid disease around this area.  The posterior nasal  cavity on the left side was actually fairly patent.  This completed the  left ethmoidectomy and removal of polyps.  At completion of the case,  submucosal cauterization of the inferior turbinates were performed  bilaterally and the remaining turbinate tissue was outfractured.  After  obtaining adequate hemostasis on the right side, the right middle meatus  as well as left middle meatus was filled with hyaluronic acid gel  packing.  This completed procedure.  No other pack was placed in the  nose.  The patient was awoke from anesthesia and transferred to the  recovery room, postop doing well.   DISPOSITION:  Because of Willi's history of obstructive sleep apnea, he  will be observed this afternoon in recovery care center and  possibly  admitted overnight for observation depending on how he is doing this  evening on postop check around 5:00.  We will plan on discharging him  home on Keflex 500 mg b.i.d. for 1 week, Tylenol and Vicodin p.r.n.  pain, and follow up in my office in 1 week for recheck and cleaning the  nose.           ______________________________  Kristine Garbe. Ezzard Standing, [name]     CEN/MEDQ  D:  10/17/2008  T:  10/17/2008  Job:  [id number]

## 2010-11-06 ENCOUNTER — Ambulatory Visit: Payer: Medicare Other | Admitting: Gastroenterology

## 2010-11-08 NOTE — Op Note (Signed)
   NAME:  Richard Rosales, Richard Rosales                         ACCOUNT NO.:  [id number]   MEDICAL RECORD NO.:  [id number]                   PATIENT TYPE:  OIB   LOCATION:  2874                                 FACILITY:  [facility]   PHYSICIAN:  Thornton Park. Daphine Deutscher, [name]             DATE OF BIRTH:  [birth year]   DATE OF PROCEDURE:  01/10/2003  DATE OF DISCHARGE:                                 OPERATIVE REPORT   PREOPERATIVE DIAGNOSIS:  Right breast mass.   POSTOPERATIVE DIAGNOSIS:  Right breast mass.   OPERATION PERFORMED:  Right breast biopsy.   SURGEON:  Thornton Park. Daphine Deutscher, [name]   ANESTHESIA:  Local MAC.   DESCRIPTION OF PROCEDURE:  Mr. Delaughter and I identified the area in his right  breast and I marked it preoperatively.  He went back to room 10 and was  given sedation.  The breast was prepped with Betadine and draped sterilely.  I made a linear incision transversely over the mass and actually went beyond  the mass on either side and then created flaps superiorly and inferiorly  with the Bovie and then went deep down to the chest wall and took out about  a 2.5 cm mass of fat.  It popped out like an encapsulated lipoma.  I did not  cut down into the mass itself, and sent it off for permanent sections.  I  then palpated carefully the entire chest wall and the fat and breast from  within and did not feel any other masses.  Hemostasis was achieved with a  Bovie.  It was then closed with 4-0 Vicryl subcutaneously and with benzoin  and Steri-Strips.  The patient seemed to tolerate the procedure well and was  taken to the recovery room in satisfactory condition.                                                Thornton Park Daphine Deutscher, [name]    MBM/MEDQ  D:  01/10/2003  T:  01/10/2003  Job:  [id number]   cc:   [name], [name]  [address], [address]  [place], [place] [address]  Fax: [phone]

## 2010-11-08 NOTE — H&P (Signed)
[facility]  Patient:    Richard Rosales, Richard Rosales Visit Number: [id number] MRN: [id number]          Service Type: SUR Location: 3W 0365 01 Attending Physician:  Londell Moh Dictated by:   Jamison Neighbor, [name] Admit Date:  01/07/2002 Discharge Date: 01/09/2002   CC:         Marinus Maw, [name]   History and Physical  ADMISSION DIAGNOSIS:  Benign prostatic hypertrophy with bladder outlet obstruction.  SECONDARY DIAGNOSIS:  Hypertension.  HISTORY OF PRESENT ILLNESS:  This is a 77 year old male who presented to the office for evaluation of bladder outlet symptoms.  He had an elevated symptom score of 28 and was found to have a post residual of 250 cc.  The patient had only a 2+ prostate that was benign in its feel.  The patient was felt to most likely have median lobe obstruction.  After consideration of all the various options, he agreed to undergo a TURP and is being admitted following that procedure.  PAST MEDICAL HISTORY:  Hypertension.  MEDICATIONS: 1. Diovan 325 mg daily. 2. Cardura 8 mg daily. 3. Ditropan 5 mg b.i.d. to try to relieve bladder outlet symptoms. 4. Lanoxin 10 mg q.h.s. 5. Labetalol 100 mg 1-1/2 tablets b.i.d. 6. Prilosec 20 mg daily. 7. Allegra 1 daily. 8. Multivitamins.  PAST SURGICAL HISTORY: 1. Tonsillectomy. 2. Bilateral herniorrhaphy. 3. Total knee replacement on each side. 4. Sinus surgery.  SOCIAL HISTORY:  The patient has not smoked since 1980.  Drinks very modest amounts of alcohol.  FAMILY HISTORY:  Positive for hypertension and diabetes.  REVIEW OF SYSTEMS:  Pertinent for frequency, slow stream, urgency, coughing, swelling, and intentional weight loss on a self-imposed diet.  PHYSICAL EXAMINATION:  GENERAL:  Well-developed, well-nourished male.  VITAL SIGNS:  Blood pressure still remains elevated at 150/98, pulse 60, respirations 16.  HEENT:  Normocephalic, atraumatic.  Cranial nerves II-XII  appear grossly intact.  NECK:  Supple.  With no adenopathy or thyromegaly.  LUNGS:  Clear.  HEART:  Regular rate and rhythm.  No murmurs, thrills, gallops, rubs, or heaves.  ABDOMEN:  Soft and nontender.  With no palpable mass.  No rebound or guarding.  GENITOURINARY:  Testicles are normal in size, shape, and consistency.  With no hydrocele, spermatocele, varicocele, hernia, or adenopathy.  The prostate is 2+ in size, smooth, ______.  EXTREMITIES:  No cyanosis, clubbing, or edema.  IMPRESSION:  Benign prostatic hypertrophy with bladder outlet obstruction.  PLAN:  Admit following TURP. Dictated by:   Jamison Neighbor, [name] Attending Physician:  Londell Moh DD:  01/07/02 TD:  01/11/02 Job: [id number]

## 2010-11-08 NOTE — Op Note (Signed)
[place]. [facility]  Patient:    Richard Rosales, Richard Rosales                      MRN: [id number] Proc. Date: 08/09/99 Adm. Date:  [id number] Attending:  Colbert Ewing                           Operative Report  PREOPERATIVE DIAGNOSIS:  End-stage degenerative arthritis, left knee.  POSTOPERATIVE DIAGNOSIS:  End-stage degenerative arthritis, left knee, with lateral patellar tracking and tethering.  PROCEDURE:  Left total knee replacement with [employer] prosthesis, Press-Fit #9  cruciate-retaining femoral component.  Cemented #11 tibial component, with 10.0 mm polyethylene insert.  Cemented recessed non-metal back 28.0 mm patella component. A lateral retinacular release.  SURGEON:  Loreta Ave, [name]  ASSISTANT:  Arlys John D. Petrarca, P.A.-C.  ANESTHESIA:  General.  ESTIMATED BLOOD LOSS:  Minimal.  TOURNIQUET TIME:  One hour.  SPECIMENS:  Bone and soft tissue.  CULTURES:  None.  COMPLICATIONS:  None.  DRESSING:  Soft compressive.  DRAINS:  Hemovac x 2.  DESCRIPTION OF PROCEDURE:  The patient was brought to the operating room and after adequate anesthesia had been obtained, the left knee was examined.  Very mild flexion contracture.  Alignment in valgus with more erosive changes laterally.  Lateral tracking and tethering of the femoropatellar joint.  Further flexion a little bit better than 100 degrees.  The ligaments were grossly stable.  The tourniquet was applied and he was prepped and draped in the usual sterile fashion. Exsanguinated with elevation of the Esmarch.  The tourniquet was inflated to 350 mmHg.  A straight incision above the patella down to the tibial tubercle.  A medial parapatellar arthrotomy.  Hemostasis obtained with the electrocautery.  The knee was exposed.  Grade 4 changes throughout, most marked laterally.  Spurs, synovitis, remnants of menisci, an anterior cruciate ligament all removed.  The distal femur was  exposed.  The intramedullaris guide placed.  Distal cut, removing 10.0 mm set at 5 degrees of valgus.  Sized for a #9 component.  Jigs put in place. Definitive cuts made.  The trial was put in place and found to fit well.  The trial removed. Appropriate retractors for the tibia placed.  The tibial spines were removed with the saw.  Intermedullary guide placed after being sized for a #11 component. The proximal cut, removing 4.0 mm off the deficient-most portion of the tibia. Once complete, all trials were put back in place with a #11 component on the tibia, #9 on the femur, and a 10.0 mm insert.  With this I had full motion, excellent stability.  The tibia large for placement of the component there.  Hand reaming of the tibia for the cruciate fit of the component.  All trials were removed.  The  patella was sized, reamed, and drilled for a 28.0 mm patella, with the spurs around the margin removed.  The patellofemoral tracking assessed with trials, and there was marked lateral tracking and tethering, necessitating a lateral release performed from inside out with electrocautery.  All trials were removed. Copious irrigation with the pulse irrigating device.  Cement prepared and placed on the  tibial and patellar components.  Tibial component was hammered into place. Excess cement was removed.  The polyethylene attached.  The femoral component was placed. The posterior cruciate ligament and popliteal tendon were retained and protected throughout.  The knee was reduced.  The patella component was put in place and excessive cement removed.  Once all cement had hardened, the knee was once again examined.  There was 5 degrees of valgus alignment and good stability, excellent motion without component lift-off.  Good patellofemoral tracking after lateral release.  The wound was irrigated.  The Hemovacs were placed and brought out through separate stab wounds.  The arthrotomy was closed  with a #1 Vicryl.  The  skin and subcutaneous tissues were closed with Vicryl and staples.  The margins of the wound were injected with Marcaine, as was the knee.  The Hemovac was clamped. A sterile compressive dressing was applied.  The tourniquet was deflated and removed.  The knee immobilizer applied.  Anesthesia was then to proceed with the placement of an epidural catheter for postoperative analgesia.  Once complete, he anesthesia was reversed and the patient was brought to the recovery room.  He tolerated the surgery well with no complications. DD:  08/09/99 TD:  08/10/99 Job: [id number]

## 2010-11-08 NOTE — Discharge Summary (Signed)
   NAME:  Richard Rosales, Richard Rosales                         ACCOUNT NO.:  [id number]   MEDICAL RECORD NO.:  [id number]                   PATIENT TYPE:  INP   LOCATION:  0365                                 FACILITY:  [facility]   PHYSICIAN:  Jamison Neighbor, [name]               DATE OF BIRTH:  1930/12/14   DATE OF ADMISSION:  01/07/2002  DATE OF DISCHARGE:  01/09/2002                                 DISCHARGE SUMMARY   ADMISSION DIAGNOSES:  1. Benign prostatic hypertrophy with bladder outlet obstruction.  2. Hypertension.  3. Esophageal reflux.   DISCHARGE DIAGNOSES:  1. Benign prostatic hypertrophy with bladder outlet obstruction.  2. Hypertension.  3. Esophageal reflux.   HISTORY OF PRESENT ILLNESS:  A 77 year old male, has evidence of bladder  outlet obstruction with postvoid residual of 250 cc.  The patient did not  have any improvement on alpha blockade, and for that reason we are admitting  him for TURP.   MEDICATIONS:  The patient's medications at the time of admission were  Diovan, Cardura, Ditropan, Lanoxin, labetalol, Prilosec, Allegra, and  multivitamins.   His past medical history and past surgical history as well as initial  history and physical are well-defined on the initial dictated history and  physical.   HOSPITAL COURSE:  The patient was taken to the operating room, where he  underwent a successful TURP.  His postoperative course was unremarkable.  The patient was continued on continuous bladder irrigation for the first 24  hours.  By the second postoperative day he was ready for discharge.  He  voided without difficulty.  The patient had no difficulty emptying his  bladder, and his urine was clear.  He was sent home with antibiotic therapy  as well as pain medication.  He will return to the office for follow-up in  two weeks' time.                                                Jamison Neighbor, [name]    RJE/MEDQ  D:  01/21/2002  T:  01/27/2002  Job:  [id number]   cc:    Marinus Maw, [name]

## 2010-11-08 NOTE — Discharge Summary (Signed)
[place]. [facility]  Patient:    Richard Rosales, Richard Rosales                      MRN: [id number] Adm. Date:  [id number] Disc. Date: [id number] Attending:  Colbert Ewing Dictator:   Oris Drone. Petrarca, P.A.-C.                           Discharge Summary  ADMISSION DIAGNOSIS:  Advanced degenerative joint disease of the left knee.  DISCHARGE DIAGNOSES: 1. Advanced degenerative joint disease of the left knee. 2. History of hypertension. 3. Exogenous obesity. 4. Diaphragmatic hernia.  PROCEDURES:  Left total knee replacement.  HISTORY OF PRESENT ILLNESS:  [age]-year-old white male, status post right total knee replacement.  He has had progressive worsening of pain in the left knee with radiographic end-stage DJD.  He has failed with conservative treatment at this time.  Now indicated for left total knee replacement.  HOSPITAL COURSE:  [age]-year-old male admitted August 09, 1999, after appropriate laboratory studies were obtained, as well as 1 g of Ancef IV on call to the operating room.  He was taken to the operating room where he underwent a left total knee replacement.  He was continued postoperatively on Ancef prophylaxis.  Heparin and Coumadin prophylaxis for antithrombotic protection were ordered. PT, OT, rehabilitation, and social service consults were ordered.  Total knee protocol.  He was allowed to weightbear as tolerated on the left with crutches or walker.  Placed with an epidural for postoperative pain management.  A Foley was placed intraoperatively.  He was also supplemented with 1-2 mg of IV morphine q.1h. for severe pain with his epidural.  He then had the epidural increased but, unfortunately, became incoherent, and his epidural was discontinued.  On August 11, 1999, the epidural was removed.  IV was changed to a Hep-Lock.  He did have  some elevation in temperature, requiring the use of Tylenol.  He was allowed  to  shower on August 13, 1999, and he was discharged on August 14, 1999, to return back to the office in follow-up.  LABORATORY DATA:  Radiographic studies of the left knee of August 09, 1999, revealed left knee prosthesis in appropriate position.  No evidence of fracture or dislocation.  Chest x-ray of ______ , shows the subtle density along the peripheral aspect of the left mid lung may simply represent confluence of shadows; however, would recommend comparison with prior exam.  Tortuous aorta is noted.  Hemoglobin of August 07, 1999, was 15.0, hematocrit 42.2%, white count 8300, platelets 226,000.  August 14, 1999, shows a hemoglobin 10.8, hematocrit 32.0%, white count 8700, platelets 241,000.  Chemistries:  Preoperative sodium 137, potassium 4.1, chloride 102, CO2 29, glucose 88, BUN 12, creatinine 0.7, calcium 8.3, total protein 6.9, albumin 4.1, AST was 28, ALT 22, ALP 73, total bilirubin 0.7.  Discharge sodium 137, potassium 3.7, chloride 100, CO2 30, glucose 99, BUN 6, creatinine 0.5, calcium 8.5.  Urinalysis was benign for a voided urine.  Blood ype was O positive, antibody screen negative.  DISCHARGE MEDICATIONS: 1. Given a prescription for Coumadin 6 mg 1 daily as directed by [facility].  2. Percocet 1-2 q.4h. p.r.n. pain.  DISCHARGE INSTRUCTIONS:  Weightbear as tolerated on the left.  Change dressing daily, keeping dressing clean and dry.  He may shower.  FOLLOW-UP:  He is to call the office for an appointment in 10 days.  CONDITION ON DISCHARGE:  Improved. DD:  09/05/99 TD:  09/05/99 Job: [id number]

## 2010-11-08 NOTE — Op Note (Signed)
[place]. [facility]  Patient:    Richard Rosales, Richard Rosales                       MRN: [id number] Proc. Date: 01/02/00 Attending:  Nicki Reaper, [name] CC:         Nicki Reaper, [name] x 2                           Operative Report  PREOPERATIVE DIAGNOSIS:  Fracture of proximal phalanx, left middle finger.  POSTOPERATIVE DIAGNOSIS:  Fracture of proximal phalanx, left middle finger.  OPERATION:  Reduction and percutaneous pinning, proximal phalanx fracture, left little finger.  SURGEON:  Nicki Reaper, [name]  ASSISTANT:  R.N.  ANESTHESIA:  Forearm based IV regional.  ANESTHESIOLOGIST:  Cliffton Asters. Ivin Booty, [name]  INDICATIONS:  The patient is a 77 year old male who caught his right little finger on a bannister, suffering a displaced fracture of his left little finger proximal phalanx.  DESCRIPTION OF PROCEDURE:  The patient was brought to the operating room where a forearm based IV regional anesthetic was carried out without difficulty.  He was prepped and draped using Betadine scrubbing solution with the left arm free.  The fracture was manipulated and reduced.  This was examined under image intensification and found to lie in good position.  With full flexion, there was no angulation of rotation.  A 3-5 K-wire was then passed through the ulnar aspect of the proximal fracture fragment to the ulnar side of the metacarpal head.  This was driven distally into the distal portion, stabilizing it.  A second 3-5 K-wire was then passed from the radial fracture fragment into the distal fragment.  This was done to the radial side of the metacarpal head.  X-rays taken in the AP and lateral direction revealed good reduction and adequate placement of both pins.  A sterile compressive dressing and splint was applied.  The patient tolerated the procedure well and was taken to the recovery room for observation in satisfactory condition.  He is discharged home to return to the [facility] in one week on Vicodin and Keflex. DD:  01/02/00 TD:  01/02/00 Job: [id number]

## 2010-11-08 NOTE — Procedures (Signed)
[place]. [facility]  Patient:    RONNY, KORFF                      MRN: [id number] Proc. Date: 08/09/99 Adm. Date:  [id number] Attending:  Colbert Ewing CC:         Loreta Ave, [name]                           Procedure Report  EPIDURAL CATHETER PLACEMENT FOR POSTOPERATIVE PAIN RELIEF  INDICATIONS: We were consulted by Dr. Mckinley Jewel to place an epidural catheter for Mr. Ebbie Ridge.  Dr. Bedelia Person discussed the procedure with the patient preoperatively and he agreed to catheter placement.  DESCRIPTION OF PROCEDURE: At the end of the procedure, prior to emerging from anesthesia I placed the epidural catheter.  The patient was turned into the right lateral decubitus position.  His back was prepped with Betadine.  A #17 Tuohy needle was introduced into the epidural space at the L2-L3 interspace without problem using loss of resistance technique in the midline  The catheter was inserted 5 cm into the epidural space and the needle removed.  The catheter was  firmly fixed to the patients back.  After negative aspiration 10 cc of 1/2% Xylocaine was injected up through catheter.  The patient was then extubated and  taken to the recovery room in stable condition.  A Fentanyl/Marcaine infusion will be begun in the PACU and he will be followed on the floor by the anesthesiology  service. DD:  08/09/99 TD:  08/10/99 Job: [id number]

## 2010-11-08 NOTE — Op Note (Signed)
[facility]  Patient:    Richard Rosales, Richard Rosales Visit Number: [id number] MRN: [id number]          Service Type: SUR Location: 3W 0365 01 Attending Physician:  Londell Moh Dictated by:   Jamison Neighbor, [name] Proc. Date: 01/07/02 Admit Date:  01/07/2002 Discharge Date: 01/09/2002   CC:         Marinus Maw, [name]   Operative Report  PREOPERATIVE DIAGNOSIS:  Benign prostatic hypertrophy with bladder outlet obstruction.  POSTOPERATIVE DIAGNOSIS:  Benign prostatic hypertrophy with bladder outlet obstruction.  PROCEDURE:  Transurethral resection of prostate.  ANESTHESIA:  Spinal.  COMPLICATIONS:  None.  DRAINS:  A 24 French three-way Foley catheter.  BRIEF HISTORY:  This 77 year old male presented to the office with marked bladder outlet symptoms. He was found to have a postvoid residual of 250 cc. He had an elevated ______ score of 28. The patient has used alpha blockade with a dose as high as 8 mg of Cardura. He has also had Ditropan to try and control his bladder outlet symptoms. Despite this, the patient has not had any improvement and has requested alternative options. He was not felt to be a candidate for 5 alpha reductase inhibitor therapy because his prostate was not particularly enlarged and he did not wish to consider local therapy such as TUNA or Targis. It was felt that the most likely etiology of the patients obstruction would be median lobe obstruction which is best treated with TURP. The patient understands the risks and benefits of the procedure and gave full and informed consent. Specifically, he knows about the risk for hematuria, urgency and/or stress incontinence and understands that it may be some time until his symptoms have completely resolved and gave full and informed consent.  DESCRIPTION OF PROCEDURE:  After successful induction of spinal anesthesia, the patient was placed in the dorsal lithotomy position and  prepped with Betadine, draped in the usual sterile fashion. The urethra was calibrated to 30 [place] with [employer] sounds. The Olympus continuous flow resectoscope sheath was then inserted using a Timberlake obturator. The Stern-McCarthy resectoscope was inserted with a 12 degree lens in place. The bladder was carefully inspected. It was free of any tumor or stones. Both ureteral orifices were normal in configuration and location. They were somewhat close to the intended line of resection because there was a median lobe that was flopping into the bladder. The patient also had some lateral lobe hypertrophy and there was definitely trabeculation in the bladder. The median lobe was carefully resected beginning at the bladder neck and extending all the way back to the verumontanum. The right lateral lobe was dissected from the 11 oclock position down to the floor of the prostate. The left lateral lobe was resected in identical fashion. Hemostasis was obtained. Additional tissue was resected from the apex until that had been nicely cleaned. The resection was now taken beyond the verumontanum in order to avoid injury to the sphincter. Rectal examination showed that most if not all the tissue had been resected. Visual inspection showed there was no residual obstruction. All chips were irrigated from the bladder, the bladder neck was not undermined. the ureteral orifices were intact. The trigone had not been injured and adequate hemostasis was obtained. The resectoscope was removed. A 24 French Foley catheter was inserted using a stylet guide and placed to straight drainage. This irrigated freely. Continuous bladder irrigation was initiated with normal saline. The patient tolerated the procedure well and was  taken to the recovery room in good condition. Dictated by:   Jamison Neighbor, [name] Attending Physician:  Londell Moh DD:  01/07/02 TD:  01/12/02 Job: [id number]

## 2010-11-08 NOTE — Cardiovascular Report (Signed)
NAME:  Richard Rosales, LOUVIER NO.:  [id number]   MEDICAL RECORD NO.:  [id number]                   PATIENT TYPE:  OIB   LOCATION:  6501                                 FACILITY:  [facility]   PHYSICIAN:  Jonelle Sidle, [name] [facility]        DATE OF BIRTH:  [birth year]   DATE OF PROCEDURE:  01/17/2004  DATE OF DISCHARGE:                              CARDIAC CATHETERIZATION   CARDIOLOGIST:  Dr. Sherryl Manges.   PRIMARY CARE PHYSICIAN:  Dr. [name].   INDICATIONS:  [name] is a 77 year old male with a history of  hypertension who had an abnormal Cardiolite, suggesting potential scar  indicative of anterolateral infarction with residual ischemia.  There have  also been discrepant results in quantifying left ventricular function.  The  patient has undergone a recent evaluation for sinus node dysfunction and  fatigue and this study is requested to clearly outline the coronary anatomy  and assess for any potential revascularization strategies beyond risk factor  modification.   PROCEDURES PERFORMED:  1. Left heart catheterization.  2. Selective coronary angiography.  3. Left ventriculography.   ACCESS AND EQUIPMENT:  The area about the right femoral artery was  anesthetized with 1% lidocaine and a 5-French sheath was placed in the right  femoral artery via the modified Seldinger technique.  Standard pre-formed 5-  [place] JL4, JL5, JL6, multipurpose and AL2 catheters were used for selective  angiography of the left main and left coronary system with varying success.  A standard 6-French JR4 catheter was used for selective coronary angiography  of the right coronary and an angled pigtail catheter was used for left heart  catheterization and left ventriculography.  All exchanges were made over  wire and the patient tolerated the procedure well without immediate  complications.   HEMODYNAMICS:  __________  ventricle:  201/15 mmHg.  __________  ventricle:   199/93 mmHg.   ANGIOGRAPHIC FINDINGS:  1. The left main coronary artery shows no significant flow limiting coronary     atherosclerosis.  2. The left anterior descending was somewhat difficult to visualize,     although adequately seen with a combination of nonselective Cosprin     injections and ultimately, use of a JL5 catheter.  There is a large     trifurcating, proximal diagonal branch noted with no major distal     branches.  No significant flow limiting coronary atherosclerosis is noted     within this system.  3. The circumflex coronary artery is a large vessel with three obtuse     marginal branches.  There is some minor plaquing in the small third     obtuse marginal branch, but no other major obstructive flow limiting     stenoses.  This vessel is dominant.  4. The right coronary artery is small and non-dominant, without any major     flow limiting coronary atherosclerosis.   Left ventriculography was performed in the RAO projection,  revealing an  ejection fraction of 55 to 60% with no obvious wall motion abnormalities.  No significant mitral regurgitation is noted.   DIAGNOSES:  1. No major obstructive coronary atherosclerosis noted in the major     epicardial vessels, as described.  2. Left ventricular ejection fraction in the range of 55 to 60% without     mitral regurgitation.  3. Left ventricular end-diastolic pressure of 15 mmHg post angiography.   RECOMMENDATIONS AT THIS POINT:  Wound continue a strategy of risk factor  modification.  I suspect that the Cardiolite was false-positive or  indicative of artifact.                                               Jonelle Sidle, [name] [facility]    SGM/MEDQ  D:  01/17/2004  T:  01/17/2004  Job:  [id number]

## 2010-11-12 ENCOUNTER — Ambulatory Visit (INDEPENDENT_AMBULATORY_CARE_PROVIDER_SITE_OTHER): Payer: Medicare Other | Admitting: Gastroenterology

## 2010-11-12 ENCOUNTER — Encounter: Payer: Self-pay | Admitting: Gastroenterology

## 2010-11-12 ENCOUNTER — Other Ambulatory Visit (INDEPENDENT_AMBULATORY_CARE_PROVIDER_SITE_OTHER): Payer: Medicare Other

## 2010-11-12 VITALS — BP 132/82 | HR 88 | Ht 70.0 in | Wt 283.0 lb

## 2010-11-12 DIAGNOSIS — K922 Gastrointestinal hemorrhage, unspecified: Secondary | ICD-10-CM

## 2010-11-12 DIAGNOSIS — K5731 Diverticulosis of large intestine without perforation or abscess with bleeding: Secondary | ICD-10-CM

## 2010-11-12 LAB — CBC WITH DIFFERENTIAL/PLATELET
Basophils Absolute: 0.1 10*3/uL (ref 0.0–0.1)
Eosinophils Relative: 1.1 % (ref 0.0–5.0)
HCT: 30.6 % — ABNORMAL LOW (ref 39.0–52.0)
Hemoglobin: 9.9 g/dL — ABNORMAL LOW (ref 13.0–17.0)
Lymphocytes Relative: 13.3 % (ref 12.0–46.0)
Lymphs Abs: 1.5 10*3/uL (ref 0.7–4.0)
Monocytes Relative: 8.2 % (ref 3.0–12.0)
Platelets: 307 10*3/uL (ref 150.0–400.0)
RDW: 20.1 % — ABNORMAL HIGH (ref 11.5–14.6)
WBC: 11.6 10*3/uL — ABNORMAL HIGH (ref 4.5–10.5)

## 2010-11-12 NOTE — Assessment & Plan Note (Addendum)
He has recurrent diverticula bleeding, specifically 3 in the last year and a half and  one13 years ago. I again discussed  the concern for recurrent diverticula bleeding and the possible need for a subtotal colectomy. He is agreed to consider surgery if he has yet another bleed.

## 2010-11-12 NOTE — Patient Instructions (Signed)
You will go to the basement for labs today  

## 2010-11-12 NOTE — Progress Notes (Signed)
History of Present Illness:  Richard Rosales has returned for GI followup. In April, 2012 he was admitted with a recurrent diverticular bleed. This was his third bleed in the last year and a half and fourth bleed altogether. He has diverticulosis extending from the sigmoid colon to the ascending colon.  Since discharge he has had no further bleeding.    Review of Systems: Pertinent positive and negative review of systems were noted in the above HPI section. All other review of systems were otherwise negative.    Current Medications, Allergies, Past Medical History, Past Surgical History, Family History and Social History were reviewed in [facility] electronic medical record  Vital signs were reviewed in today's medical record. Physical Exam: General: Well developed , well nourished, no acute distress

## 2010-11-26 ENCOUNTER — Other Ambulatory Visit: Payer: Self-pay | Admitting: Internal Medicine

## 2010-11-26 ENCOUNTER — Encounter: Payer: Self-pay | Admitting: Emergency Medicine

## 2010-11-26 ENCOUNTER — Ambulatory Visit (INDEPENDENT_AMBULATORY_CARE_PROVIDER_SITE_OTHER): Payer: Medicare Other | Admitting: Emergency Medicine

## 2010-11-26 ENCOUNTER — Encounter (HOSPITAL_BASED_OUTPATIENT_CLINIC_OR_DEPARTMENT_OTHER): Payer: Medicare Other | Admitting: Internal Medicine

## 2010-11-26 DIAGNOSIS — D539 Nutritional anemia, unspecified: Secondary | ICD-10-CM

## 2010-11-26 DIAGNOSIS — G4733 Obstructive sleep apnea (adult) (pediatric): Secondary | ICD-10-CM

## 2010-11-26 DIAGNOSIS — J309 Allergic rhinitis, unspecified: Secondary | ICD-10-CM

## 2010-11-26 DIAGNOSIS — J449 Chronic obstructive pulmonary disease, unspecified: Secondary | ICD-10-CM

## 2010-11-26 DIAGNOSIS — D649 Anemia, unspecified: Secondary | ICD-10-CM

## 2010-11-26 DIAGNOSIS — J4489 Other specified chronic obstructive pulmonary disease: Secondary | ICD-10-CM

## 2010-11-26 LAB — CBC & DIFF AND RETIC
Basophils Absolute: 0.1 10*3/uL (ref 0.0–0.1)
Eosinophils Absolute: 0.3 10*3/uL (ref 0.0–0.5)
HCT: 34.5 % — ABNORMAL LOW (ref 38.4–49.9)
HGB: 10.8 g/dL — ABNORMAL LOW (ref 13.0–17.1)
Immature Retic Fract: 6.2 % (ref 0.00–13.40)
LYMPH%: 18.3 % (ref 14.0–49.0)
MONO#: 0.7 10*3/uL (ref 0.1–0.9)
NEUT%: 65.6 % (ref 39.0–75.0)
Platelets: 241 10*3/uL (ref 140–400)
Retic Ct Abs: 45.73 10*3/uL (ref 24.10–77.50)
WBC: 6.5 10*3/uL (ref 4.0–10.3)
lymph#: 1.2 10*3/uL (ref 0.9–3.3)

## 2010-11-26 NOTE — Assessment & Plan Note (Signed)
CPAP.  

## 2010-11-26 NOTE — Progress Notes (Signed)
Subjective:    Patient ID: Richard Rosales, male    DOB: 1931/06/01, 77 y.o.   MRN: [id number]  HPI 77 yo man, former smoker, also with hx of HTN, A Fib, chronic LE edema, GERD, diverticulosis, remote positive PPD. OSA on CPAP. Recent GIB in 2/10, Hgb subsequently stable.  Referred for new exertional SOB. Has been most noticable over the last 3 weeks. Was seen by Dr Oneta Rack, started on Advair about 3 weeks ago due to wheezing. He isn't sure that it has helped him in any way. Has had more clear PND, cough, throat irritation. Cough productive of clear phlegm. No hemoptysis. No CP. Wt stable over the last yr. Has not been using NSW's. Tried claritin before without results.   ROV 11/01/09 -- ROV for exertional dyspnea. auto-set CPAP shows he needs 12cmH2O. He is interested in changing his DME provider to [facility]. TTE with preserved LVEF, PASP 34, not able to comment on diastolic fxn. PFT today show. Tells me that his am cough, throat irritation is about the same as last time - started nasal steroid, NSWs.   ROV 12/14/09 -- returns for f/u obstructive lung dz, OSA, allergies. Feels better on the Symbicort started last time. Has been using fluticasone once daily and NSWs once daily with good results. Got a new CPAP mask and it is working very well. No new problems.   ROV 05/28/10 -- COPD, OSA, allergies. Here for f/u. Still with some cough and sneezing, espec around dog hair. Using fluticasone 1 spray each side once daily. On Symbicort for about 7 months, beginning to notice some jitters. He has had this sensation before with prednisone. He definitely notices that breathing is better on the meds. He has good CPAP compliance. Has am cough, difficulty clearing mucous in the am.  10/25/10 Acute OV Pt presents for work in visit. Complains of DOE, wheezing, cough occasionally producing clear mucus x2weeks .  Cough is getting worse esp. At night. Mucus has some yellow mixed with clear. OTC not helping.  Is  taking his symbicort regularly. No fever or hemoptysis . NO increased leg swellling or chest pain. No recent  abx or travel.   ROV 11/26/10 -- hx COPD and OSA. Treated for acute bronchitis last time. Treated by TP and then Dr Oneta Rack with dexamethasone, abx. Still having exertional SOB, last Hgb was 8.0 (from 10.0). Taking Symbicort bid and allergy regimen. He uses CPAP reliably.    Review of Systems Constitutional:   No  weight loss, night sweats,  Fevers, chills, + fatigue, or  lassitude.  HEENT:   No headaches,  Difficulty swallowing,  Tooth/dental problems, or  Sore throat,                No sneezing, itching, ear ache, nasal congestion, post nasal drip,   CV:  No chest pain,  Orthopnea, PND, swelling in lower extremities, anasarca, dizziness, palpitations, syncope.   GI  No heartburn, indigestion, abdominal pain, nausea, vomiting, diarrhea, change in bowel habits, loss of appetite, bloody stools.   Resp:   No coughing up of blood.     No wheezing.  No chest wall deformity  Skin: no rash or lesions.  GU: no dysuria, change in color of urine, no urgency or frequency.  No flank pain, no hematuria   MS:  No joint pain or swelling.  No decreased range of motion.  No back pain.  Psych:  No change in mood or affect. No depression or anxiety.  No memory loss.         Objective:   Physical Exam GEN: A/Ox3; pleasant , NAD, obese   HEENT:  [place]/AT,  EACs-clear, TMs-wnl, NOSE-clear, THROAT-clear, no lesions, no postnasal drip or exudate noted.   NECK:  Supple w/ fair ROM; no JVD; normal carotid impulses w/o bruits; no thyromegaly or nodules palpated; no lymphadenopathy.  RESP  Coarse BS w/ w/o, wheezes/ rales/ or rhonchi.no accessory muscle use, no dullness to percussion  CARD:  RRR, no m/r/g  , no peripheral edema, pulses intact, no cyanosis or clubbing.  GI:   Soft & nt; nml bowel sounds; no organomegaly or masses detected.  Musco: Warm bil, no deformities or joint swelling noted.    Neuro: alert, no focal deficits noted.    Skin: Warm, no lesions or rashes         Assessment & Plan:  COPD Try adding loratadine 10mg  daily during the allergy season Continue your fluticasone nasal spray twice a day Continue your Symbicort twice a day Continue your CPAP every night Follow up with Dr Delton Coombes in 6 months or sooner if needed.  ALLERGIC RHINITIS Try adding loratadine 10mg  daily during the allergy season Continue your fluticasone nasal spray twice a day  SLEEP APNEA, OBSTRUCTIVE CPAP

## 2010-11-26 NOTE — Patient Instructions (Signed)
Try adding loratadine 10mg  daily during the allergy season Continue your fluticasone nasal spray twice a day Continue your Symbicort twice a day Continue your CPAP every night Follow up with Dr Delton Coombes in 6 months or sooner if needed.

## 2010-11-26 NOTE — Assessment & Plan Note (Signed)
Try adding loratadine 10mg  daily during the allergy season Continue your fluticasone nasal spray twice a day

## 2010-11-26 NOTE — Assessment & Plan Note (Signed)
Try adding loratadine 10mg daily during the allergy season Continue your fluticasone nasal spray twice a day Continue your Symbicort twice a day Continue your CPAP every night Follow up with Dr Elis Sauber in 6 months or sooner if needed.  

## 2010-11-27 ENCOUNTER — Encounter: Payer: Medicare Other | Admitting: Internal Medicine

## 2010-11-28 LAB — COMPREHENSIVE METABOLIC PANEL
ALT: 18 U/L (ref 0–53)
AST: 26 U/L (ref 0–37)
Albumin: 4.3 g/dL (ref 3.5–5.2)
Alkaline Phosphatase: 66 U/L (ref 39–117)
Glucose, Bld: 103 mg/dL — ABNORMAL HIGH (ref 70–99)
Potassium: 4.1 mEq/L (ref 3.5–5.3)
Sodium: 134 mEq/L — ABNORMAL LOW (ref 135–145)
Total Bilirubin: 0.5 mg/dL (ref 0.3–1.2)
Total Protein: 6.4 g/dL (ref 6.0–8.3)

## 2010-11-28 LAB — IRON AND TIBC
%SAT: 7 % — ABNORMAL LOW (ref 20–55)
Iron: 27 ug/dL — ABNORMAL LOW (ref 42–165)

## 2010-11-28 LAB — FERRITIN: Ferritin: 18 ng/mL — ABNORMAL LOW (ref 22–322)

## 2010-11-28 LAB — PROTEIN ELECTROPHORESIS, SERUM
Gamma Globulin: 11.6 % (ref 11.1–18.8)
Total Protein, Serum Electrophoresis: 6.4 g/dL (ref 6.0–8.3)

## 2010-11-28 LAB — FOLATE: Folate: >20 ng/mL

## 2010-11-28 LAB — VITAMIN B12: Vitamin B-12: 338 pg/mL (ref 211–911)

## 2010-11-28 LAB — ERYTHROPOIETIN: Erythropoietin: 42.2 m[IU]/mL — ABNORMAL HIGH (ref 2.6–34.0)

## 2010-12-26 ENCOUNTER — Ambulatory Visit: Payer: Medicare Other | Admitting: Internal Medicine

## 2011-01-15 ENCOUNTER — Ambulatory Visit (INDEPENDENT_AMBULATORY_CARE_PROVIDER_SITE_OTHER): Payer: Medicare Other | Admitting: Internal Medicine

## 2011-01-15 ENCOUNTER — Ambulatory Visit: Payer: Medicare Other | Admitting: Internal Medicine

## 2011-01-15 ENCOUNTER — Encounter: Payer: Self-pay | Admitting: Internal Medicine

## 2011-01-15 DIAGNOSIS — K922 Gastrointestinal hemorrhage, unspecified: Secondary | ICD-10-CM

## 2011-01-15 DIAGNOSIS — I5033 Acute on chronic diastolic (congestive) heart failure: Secondary | ICD-10-CM

## 2011-01-15 DIAGNOSIS — I10 Essential (primary) hypertension: Secondary | ICD-10-CM

## 2011-01-15 DIAGNOSIS — I4891 Unspecified atrial fibrillation: Secondary | ICD-10-CM

## 2011-01-15 DIAGNOSIS — IMO0002 Reserved for concepts with insufficient information to code with codable children: Secondary | ICD-10-CM

## 2011-01-15 LAB — CBC WITH DIFFERENTIAL/PLATELET
Basophils Absolute: 0.1 10*3/uL (ref 0.0–0.1)
Eosinophils Relative: 3.8 % (ref 0.0–5.0)
HCT: 37.5 % — ABNORMAL LOW (ref 39.0–52.0)
Hemoglobin: 12.2 g/dL — ABNORMAL LOW (ref 13.0–17.0)
Lymphocytes Relative: 22.2 % (ref 12.0–46.0)
Lymphs Abs: 2 10*3/uL (ref 0.7–4.0)
Monocytes Relative: 9.5 % (ref 3.0–12.0)
Neutro Abs: 5.7 10*3/uL (ref 1.4–7.7)
Platelets: 300 10*3/uL (ref 150.0–400.0)
RDW: 18.7 % — ABNORMAL HIGH (ref 11.5–14.6)
WBC: 9 10*3/uL (ref 4.5–10.5)

## 2011-01-15 LAB — BASIC METABOLIC PANEL
Calcium: 8.6 mg/dL (ref 8.4–10.5)
GFR: 97.92 mL/min (ref >60.00)
Potassium: 3.6 mEq/L (ref 3.5–5.1)
Sodium: 139 mEq/L (ref 135–145)

## 2011-01-15 NOTE — Assessment & Plan Note (Signed)
Stop minoxidil today Follow BP Sodium restriction bmet today

## 2011-01-15 NOTE — Assessment & Plan Note (Signed)
Stable Check CBC today

## 2011-01-15 NOTE — Progress Notes (Signed)
The patient presents today for routine electrophysiology followup.  Since last being seen in our clinic, the patient reports doing reasonably well.  He was hospitalized in April with recurrent lower GI bleeding.  Today, he denies symptoms of palpitations, chest pain, shortness of breath, orthopnea, PND,  dizziness, presyncope, syncope, or neurologic sequela.  He has stable edema.  The patient feels that he is tolerating medications without difficulties and is otherwise without complaint today.   Past Medical History  Diagnosis Date  . HTN (hypertension)   . Diastolic dysfunction   . Atrial fibrillation   . GERD (gastroesophageal reflux disease)   . Allergic rhinitis   . Hypothyroidism   . DJD (degenerative joint disease)   . Diverticulosis of colon     recurrent GI bleeding  . OSA (obstructive sleep apnea)     compliants w/ CPAP  . Hyperplastic colon polyp 2003  . Chronic gastritis   . Positive PPD     remote  . Atrial flutter   . Acute bronchitis   . Anemia     iron deficient   Past Surgical History  Procedure Date  . Cardiac catheterization 7.27.05    revealed a preserved EF with no CAD  . Tonsillectomy   . Hernia repair     bilateral  . Total knee arthroplasty     bilateral  . Nasal sinus surgery   . Hernia repair     Current Outpatient Prescriptions  Medication Sig Dispense Refill  . b complex vitamins tablet Take 1 tablet by mouth every morning.        . Cholecalciferol (VITAMIN D) 2000 UNITS CAPS Take 1 capsule by mouth daily.        Marland Kitchen DILT-XR 240 MG 24 hr capsule Take 240 mg by mouth Twice daily.      Marland Kitchen DIOVAN 320 MG tablet Take 320 mg by mouth At bedtime.      . docusate sodium (COLACE) 100 MG capsule Take 100 mg by mouth 2 (two) times daily.        . fish oil-omega-3 fatty acids 1000 MG capsule Take 1 g by mouth 2 (two) times daily.        . fluticasone (FLONASE) 50 MCG/ACT nasal spray 2 sprays Twice daily.      . furosemide (LASIX) 40 MG tablet Take 40 mg by  mouth daily as needed.        . Glucosamine HCl 1000 MG TABS Take 1 tablet by mouth daily.        Marland Kitchen guaifenesin (HUMIBID E) 400 MG TABS Take 400 mg by mouth 2 (two) times daily.        Marland Kitchen LEVOXYL 100 MCG tablet Take 100 mcg by mouth Every morning.      . minoxidil (LONITEN) 10 MG tablet 1/2 tab by mouth at bedtime      . omeprazole (PRILOSEC) 20 MG capsule Take 20 mg by mouth Twice daily before meals.      Marland Kitchen PROVIGIL 200 MG tablet 1/2 tab by mouth on Wed and Sun when pt drives      . Psyllium (METAMUCIL) 28.3 % POWD 1 scoop twice daily       . SYMBICORT 160-4.5 MCG/ACT inhaler Take 2 puffs by mouth Twice daily.        Allergies  Allergen Reactions  . Advair Hfa     Unable to remember per pt  . Prednisone     Shakes / tremors    History  Social History  . Marital Status: Married    Spouse Name: N/A    Number of Children: 4  . Years of Education: N/A   Occupational History  . part-time as a Education officer, environmental at [employer]    Social History Main Topics  . Smoking status: Former Smoker -- 1.0 packs/day for 20 years    Types: Cigarettes    Quit date: 08/01/1978  . Smokeless tobacco: Never Used  . Alcohol Use: Yes     a glass of wine a day  . Drug Use: No  . Sexually Active: Not on file   Other Topics Concern  . 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    Family History  Problem Relation Age of Onset  . Hypertension Mother   . Diabetes Father   . Lung cancer Brother     chemical    ROS-  All systems are reviewed and are negative except as outlined in the HPI above    Physical Exam: Filed Vitals:   01/15/11 1340  BP: 124/74  Pulse: 91  Resp: 14  Height: 5\' 10"  (1.778 m)  Weight: 285 lb (129.275 kg)    GEN- The patient is overweight appearing, alert and oriented x 3 today.   Head- normocephalic, atraumatic Eyes-  Sclera  clear, conjunctiva pink Ears- hearing intact Oropharynx- clear Neck- supple, no JVP Lymph- no cervical lymphadenopathy Lungs- Clear to ausculation bilaterally, normal work of breathing Heart- irregular rate and rhythm, no murmurs, rubs or gallops, PMI not laterally displaced GI- soft, NT, ND, + BS Extremities- no clubbing, cyanosis, 2+ edema MS- no significant deformity or atrophy Skin- no rash or lesion Psych- euthymic mood, full affect Neuro- strength and sensation are intact  ekg today reveals atrial flutter, V rate 92, IVCD, anterior and inferior infarction pattern  Assessment and Plan:

## 2011-01-15 NOTE — Assessment & Plan Note (Signed)
Worsened by venous insufficiency, minoxidil and cardizem  2 gram sodium diet Support stockings He will follow BP off of minoxidil

## 2011-01-15 NOTE — Patient Instructions (Signed)
Your physician wants you to follow-up in: 6 months with Dr Jacquiline Doe will receive a reminder letter in the mail two months in advance. If you don't receive a letter, please call our office to schedule the follow-up appointment.  Your physician recommends that you return for lab work today BMP/CBC  Watch Salt in your diet ---2gram Sodium diet and start wearing compression stockings

## 2011-01-15 NOTE — Assessment & Plan Note (Signed)
He has permanent atrial fibrillation and atrial flutter Due to recurrent GI bleeding, he is not a coumadin candidate He is reasonably rate controlled.

## 2011-02-13 ENCOUNTER — Other Ambulatory Visit: Payer: Self-pay | Admitting: Emergency Medicine

## 2011-03-18 ENCOUNTER — Telehealth: Payer: Self-pay | Admitting: Emergency Medicine

## 2011-03-18 MED ORDER — BUDESONIDE-FORMOTEROL FUMARATE 160-4.5 MCG/ACT IN AERO: 2.0000 | INHALATION_SPRAY | Freq: Two times a day (BID) | RESPIRATORY_TRACT | Status: DC

## 2011-03-18 NOTE — Telephone Encounter (Signed)
Spoke with pt. He states needs new rx for symbicort sent to [facility]- last one was sent # 1 inhaler with 3 refills. I apologized for this inconvenience and offered to give him some samples. He states this is not needed and was okay with just resending rx.

## 2011-03-30 ENCOUNTER — Other Ambulatory Visit: Payer: Self-pay | Admitting: Internal Medicine

## 2011-04-22 ENCOUNTER — Other Ambulatory Visit: Payer: Self-pay | Admitting: Orthopedic Surgery

## 2011-04-22 DIAGNOSIS — M25511 Pain in right shoulder: Secondary | ICD-10-CM

## 2011-04-29 ENCOUNTER — Ambulatory Visit
Admission: RE | Admit: 2011-04-29 | Discharge: 2011-04-29 | Disposition: A | Discharge status: Home / Self Care | Payer: Medicare Other | Source: Ambulatory Visit | Attending: Orthopedic Surgery | Admitting: Orthopedic Surgery

## 2011-04-29 DIAGNOSIS — M25511 Pain in right shoulder: Secondary | ICD-10-CM

## 2011-05-02 ENCOUNTER — Telehealth: Payer: Self-pay | Admitting: Emergency Medicine

## 2011-05-02 NOTE — Telephone Encounter (Signed)
I spoke with pt and he stated he needed surgical clearance ASAP for tear in R rotator cuff. He states Dr. Orie Fisherman will not schedule it until he gets pulmonary clearance. Since RB is booked up, pt is coming in to see RA 11/13 at 2:45.

## 2011-05-06 ENCOUNTER — Encounter: Payer: Self-pay | Admitting: Pulmonary Disease

## 2011-05-06 ENCOUNTER — Ambulatory Visit (INDEPENDENT_AMBULATORY_CARE_PROVIDER_SITE_OTHER)
Admission: RE | Admit: 2011-05-06 | Discharge: 2011-05-06 | Disposition: A | Discharge status: Home / Self Care | Payer: Medicare Other | Source: Ambulatory Visit | Attending: Pulmonary Disease | Admitting: Pulmonary Disease

## 2011-05-06 ENCOUNTER — Ambulatory Visit (INDEPENDENT_AMBULATORY_CARE_PROVIDER_SITE_OTHER): Payer: Medicare Other | Admitting: Pulmonary Disease

## 2011-05-06 VITALS — BP 138/78 | HR 86 | Temp 98.2°F | Ht 70.0 in | Wt 295.4 lb

## 2011-05-06 DIAGNOSIS — G4733 Obstructive sleep apnea (adult) (pediatric): Secondary | ICD-10-CM

## 2011-05-06 DIAGNOSIS — J449 Chronic obstructive pulmonary disease, unspecified: Secondary | ICD-10-CM

## 2011-05-06 NOTE — Assessment & Plan Note (Signed)
Would resume CPAP 12 cm in the immediate post operative recovery & use it during hospital stay Would anticipate difficult airway .

## 2011-05-06 NOTE — Assessment & Plan Note (Signed)
PFTs 5/11  Showed moderate airway obstruction with FEv1 55% & good BD respnse - improved to 67% Stay on symbicort including day of surgery - can convert to albuterol nebs q 6h prn post op & resume symbicort on d#2 Given recent use of steroids, would give stress dose hydrocort 100 mg iV immediately prior to surgery - history confirms that this is not an allergy.

## 2011-05-06 NOTE — Patient Instructions (Addendum)
Chest xray today You are cleared for surgery from a lung standpoint You have COPD - lung function is at 67% Take symbicort daily including on morning of surgery Steroid shot on day of surgery. Let your anesthesiologist know about obstructive sleep apnea - your CPAP is set at 12 cm

## 2011-05-06 NOTE — Progress Notes (Signed)
Subjective:    Patient ID: Richard Rosales, male    DOB: 02-13-1931, 77 y.o.   MRN: [id number]  HPI PCP- [facility]  77 yo man, former smoker(quit'80)  For FU of COPD &  OSA on CPAP 12 cm. He has  hx of HTN, A Fib, chronic LE edema, GERD, diverticulosis, remote positive PPD.   TTE with preserved LVEF, PASP 34, not able to comment on diastolic fxn. PFTs 5/11  Showed moderate airway obstruction with FEv1 55% & good BD respnse - improved to 67%   05/06/2011 Needs clearance for rotator cuff surgery - Dr Eulah Pont He can walk 100 yards - limited by osteoarthritis in feet. Also has balance issues . Occasional morning wheeze. Rides exercise bike x daily at own pace. Last bronchitis was in 6/12 , required steroids- dexa, steroids make him shake  Not a true allergy (cannot use prednisone)   Past Medical History  Diagnosis Date  . HTN (hypertension)   . Diastolic dysfunction   . Atrial fibrillation   . GERD (gastroesophageal reflux disease)   . Allergic rhinitis   . Hypothyroidism   . DJD (degenerative joint disease)   . Diverticulosis of colon     recurrent GI bleeding  . OSA (obstructive sleep apnea)     compliants w/ CPAP  . Hyperplastic colon polyp 2003  . Chronic gastritis   . Positive PPD     remote  . Atrial flutter   . Acute bronchitis   . Anemia     iron deficient   Past Surgical History  Procedure Date  . Cardiac catheterization 7.27.05    revealed a preserved EF with no CAD  . Tonsillectomy   . Hernia repair     bilateral  . Total knee arthroplasty     bilateral  . Nasal sinus surgery   . Hernia repair     Review of Systems Patient denies significant dyspnea,cough, hemoptysis,  chest pain, palpitations, pedal edema, orthopnea, paroxysmal nocturnal dyspnea, lightheadedness, nausea, vomiting, abdominal or  leg pains      Objective:   Physical Exam Gen. Pleasant, well-nourished, in no distress, normal affect ENT - no lesions, no post nasal  drip, class 3 airway Neck: No JVD, no thyromegaly, no carotid bruits Lungs: no use of accessory muscles, no dullness to percussion, clear without rales or rhonchi  Cardiovascular: Rhythm regular, heart sounds  normal, no murmurs or gallops, no peripheral edema Abdomen: soft and non-tender, no hepatosplenomegaly, BS normal. Musculoskeletal: No deformities, no cyanosis or clubbing Neuro:  alert, non focal  CBC    Component Value Date/Time   WBC 9.0 01/15/2011 1450   RBC 4.62 01/15/2011 1450   HGB 12.2* 01/15/2011 1450   HGB 10.8* 11/26/2010 0942   HCT 37.5* 01/15/2011 1450   HCT 34.5* 11/26/2010 0942   PLT 300.0 01/15/2011 1450   PLT 241 11/26/2010 0942   MCV 81.1 01/15/2011 1450   MCV 83.7 11/26/2010 0942   MCH 31.0 10/05/2010 1440   MCHC 32.5 01/15/2011 1450   RDW 18.7* 01/15/2011 1450   LYMPHSABS 2.0 01/15/2011 1450   MONOABS 0.9 01/15/2011 1450   EOSABS 0.3 01/15/2011 1450   EOSABS 0.3 11/26/2010 0942   BASOSABS 0.1 01/15/2011 1450   BASOSABS 0.1 11/26/2010 0942    BMET    Component Value Date/Time   NA 139 01/15/2011 1450   K 3.6 01/15/2011 1450   CL 102 01/15/2011 1450   CO2 31 01/15/2011 1450   GLUCOSE 96  01/15/2011 1450   BUN 17 01/15/2011 1450   CREATININE 0.8 01/15/2011 1450   CALCIUM 8.6 01/15/2011 1450   GFRNONAA >60 10/05/2010 0630   GFRAA  Value: >60        The eGFR has been calculated using the MDRD equation. This calculation has not been validated in all clinical situations. eGFR's persistently <60 mL/min signify possible Chronic Kidney Disease. 10/05/2010 0630          Assessment & Plan:

## 2011-05-08 ENCOUNTER — Ambulatory Visit (INDEPENDENT_AMBULATORY_CARE_PROVIDER_SITE_OTHER): Payer: Medicare Other | Admitting: Physician Assistant

## 2011-05-08 ENCOUNTER — Encounter: Payer: Self-pay | Admitting: Physician Assistant

## 2011-05-08 VITALS — BP 122/72 | HR 85 | Resp 18 | Ht 69.0 in | Wt 274.8 lb

## 2011-05-08 DIAGNOSIS — I509 Heart failure, unspecified: Secondary | ICD-10-CM

## 2011-05-08 DIAGNOSIS — I10 Essential (primary) hypertension: Secondary | ICD-10-CM

## 2011-05-08 DIAGNOSIS — Z01810 Encounter for preprocedural cardiovascular examination: Secondary | ICD-10-CM

## 2011-05-08 DIAGNOSIS — I5032 Chronic diastolic (congestive) heart failure: Secondary | ICD-10-CM

## 2011-05-08 DIAGNOSIS — M75101 Unspecified rotator cuff tear or rupture of right shoulder, not specified as traumatic: Secondary | ICD-10-CM

## 2011-05-08 DIAGNOSIS — I4891 Unspecified atrial fibrillation: Secondary | ICD-10-CM

## 2011-05-08 DIAGNOSIS — J4489 Other specified chronic obstructive pulmonary disease: Secondary | ICD-10-CM

## 2011-05-08 DIAGNOSIS — J449 Chronic obstructive pulmonary disease, unspecified: Secondary | ICD-10-CM

## 2011-05-08 NOTE — Progress Notes (Signed)
History of Present Illness: PCP:  Dr. Oneta Rack Primary Cardiologist:  Dr. Hillis Range   Richard Rosales is a 77 y.o. male who presents for surgical clearance.  He has a h/o HTN, diastolic CHF, AFib, Aflutter, GERD, hypothyroidism, and recurrent diverticular bleeding.  He is not on coumadin.  LHC 7/05: no CAD, EF 55-60%.  Echo 5/11: EF 55%, Ao sclerosis without AS, Trivial MR, PASP 34, mild LAE, mod RAE.  He needs right rotator cuff surgery with Dr. Mckinley Jewel.  Date is TBD.  He denies chest pain or dyspnea.  He cannot walk or go up hills due to severe DJD in his feet.  He does however ride a stationary bike 6 days a week for 30 mins (5.5 miles) without chest pain or dyspnea.  He can vacuum some and denies chest pain or dyspnea with this.  He denies orthopnea, PND, syncope, near syncope or palpitations.  He has chronic edema that is somewhat better.  Weight is down 9 lbs since his last visit.    Past Medical History  Diagnosis Date  . HTN (hypertension)   . Diastolic dysfunction   . Atrial fibrillation   . GERD (gastroesophageal reflux disease)   . Allergic rhinitis   . Hypothyroidism   . DJD (degenerative joint disease)   . Diverticulosis of colon     recurrent GI bleeding  . OSA (obstructive sleep apnea)     compliants w/ CPAP  . Hyperplastic colon polyp 2003  . Chronic gastritis   . Positive PPD     remote  . Atrial flutter   . Acute bronchitis   . Anemia     iron deficient    Current Outpatient Prescriptions  Medication Sig Dispense Refill  . aspirin 81 MG tablet Take 81 mg by mouth daily. Take on mon,wed & Friday.       Marland Kitchen b complex vitamins tablet Take 1 tablet by mouth every morning.        . budesonide-formoterol (SYMBICORT) 160-4.5 MCG/ACT inhaler Inhale 2 puffs into the lungs 2 (two) times daily.  3 Inhaler  3  . Cholecalciferol (VITAMIN D) 2000 UNITS CAPS Take 1 capsule by mouth daily.        Marland Kitchen DILT-XR 240 MG 24 hr capsule TAKE 1 CAPSULE TWICE A DAY  180 capsule  1  .  DIOVAN 320 MG tablet Take 320 mg by mouth At bedtime.      . docusate sodium (COLACE) 100 MG capsule Take 100 mg by mouth 2 (two) times daily.        . fish oil-omega-3 fatty acids 1000 MG capsule Take 1 g by mouth 2 (two) times daily.        . fluticasone (FLONASE) 50 MCG/ACT nasal spray 2 sprays Twice daily.      . furosemide (LASIX) 40 MG tablet Take 40 mg by mouth daily as needed.        . Glucosamine HCl 1000 MG TABS Take 1 tablet by mouth daily.        Marland Kitchen guaifenesin (HUMIBID E) 400 MG TABS Take 400 mg by mouth 2 (two) times daily.        . Iron 66 MG TABS Take 1 tablet by mouth as directed. Take on Mon, Wed, & Friday.       Marland Kitchen LEVOXYL 100 MCG tablet Take 100 mcg by mouth Every morning.      . minoxidil (LONITEN) 10 MG tablet 1/2 tab by mouth at bedtime      .  omeprazole (PRILOSEC) 20 MG capsule Take 20 mg by mouth Twice daily before meals.      . Psyllium (METAMUCIL) 28.3 % POWD 1 scoop twice daily       . PROVIGIL 200 MG tablet 1/2 tab by mouth on Wed and Sun when pt drives        Allergies: Allergies  Allergen Reactions  . Advair Hfa     Unable to remember per pt  . Prednisone     Shakes / tremors    History  Substance Use Topics  . Smoking status: Former Smoker -- 1.0 packs/day for 20 years    Types: Cigarettes    Quit date: 08/01/1978  . Smokeless tobacco: Never Used  . Alcohol Use: Yes     a glass of wine a day     ROS:  Please see the history of present illness.   All other systems reviewed and negative.   Vital Signs: BP 122/72  Pulse 85  Resp 18  Ht 5\' 9"  (1.753 m)  Wt 274 lb 12.8 oz (124.648 kg)  BMI 40.58 kg/m2  PHYSICAL EXAM: Well nourished, well developed, in no acute distress HEENT: normal Neck: no JVD at 90 degrees Cardiac:  normal S1, S2; RRR; no murmur Lungs:  Decreased breath sounds bilaterally, no wheezing, rhonchi or rales Abd: soft, non-tender  Ext: 1+ bilateral edema Skin: warm and dry Neuro:  CNs 2-12 intact, no focal abnormalities  noted Psych: normal affect  EKG:   NSR, HR 89, LAD, PRWP, NSSTTW changes, no change from prior tracing  ASSESSMENT AND PLAN:

## 2011-05-08 NOTE — Assessment & Plan Note (Signed)
In NSR.  Not on coumadin due to LGI bleeding in the past.

## 2011-05-08 NOTE — Assessment & Plan Note (Signed)
The patient does not have any unstable cardiac conditions.  He had a normal heart cath in 2005 at the age of 39.  He can achieve 4 METs with his bike riding without anginal symptoms.  His volume status is stable and he remains in NSR.  According to [facility] and AHA guidelines, the patient requires no further cardiac workup prior to their noncardiac surgery.  The patient should be at acceptable cardiovascular risk.  Our service is available as necessary in the perioperative period.

## 2011-05-08 NOTE — Assessment & Plan Note (Signed)
?   If he should see Dr. Vassie Loll before having surgery to assess pulmonary status.  I will leave this up to his surgeon and anesthesiologist.

## 2011-05-08 NOTE — Assessment & Plan Note (Signed)
Volume stable.  He will need close attention to volume status in peri-op period.

## 2011-05-08 NOTE — Patient Instructions (Signed)
Your physician recommends that you schedule a follow-up appointment in: AS SCHEDULED  NO CHANGES TODAY

## 2011-05-08 NOTE — Assessment & Plan Note (Signed)
Controlled.  Continue current therapy.  

## 2011-05-12 ENCOUNTER — Other Ambulatory Visit: Payer: Self-pay | Admitting: Emergency Medicine

## 2011-05-12 NOTE — Progress Notes (Signed)
Quick Note:  I informed pt of RA's findings and recommendations. Pt verbalized understanding  ______ 

## 2011-06-03 ENCOUNTER — Encounter (HOSPITAL_BASED_OUTPATIENT_CLINIC_OR_DEPARTMENT_OTHER): Payer: Self-pay | Admitting: *Deleted

## 2011-06-03 NOTE — Progress Notes (Signed)
To bring all meds,overnight bag,cpap, Pt has medical,cardiac, pulmonary clearances for surg Cxr, ekg done 11/12

## 2011-06-04 NOTE — Progress Notes (Signed)
Nursing Note:   Chart reviewed by DR. Frederick while doing pre op planning.  Ok to proceed with surgery as planned.

## 2011-06-05 ENCOUNTER — Encounter (HOSPITAL_BASED_OUTPATIENT_CLINIC_OR_DEPARTMENT_OTHER): Payer: Self-pay | Admitting: Anesthesiology

## 2011-06-05 ENCOUNTER — Ambulatory Visit (HOSPITAL_BASED_OUTPATIENT_CLINIC_OR_DEPARTMENT_OTHER): Payer: Medicare Other | Admitting: Anesthesiology

## 2011-06-05 ENCOUNTER — Ambulatory Visit (HOSPITAL_BASED_OUTPATIENT_CLINIC_OR_DEPARTMENT_OTHER)
Admission: RE | Admit: 2011-06-05 | Discharge: 2011-06-06 | Disposition: A | Discharge status: Home / Self Care | Payer: Medicare Other | Source: Ambulatory Visit | Attending: Orthopedic Surgery | Admitting: Orthopedic Surgery

## 2011-06-05 ENCOUNTER — Encounter (HOSPITAL_BASED_OUTPATIENT_CLINIC_OR_DEPARTMENT_OTHER): Payer: Self-pay | Admitting: *Deleted

## 2011-06-05 ENCOUNTER — Encounter (HOSPITAL_BASED_OUTPATIENT_CLINIC_OR_DEPARTMENT_OTHER)
Admission: RE | Disposition: A | Discharge status: Home / Self Care | Payer: Self-pay | Source: Ambulatory Visit | Attending: Orthopedic Surgery

## 2011-06-05 DIAGNOSIS — Z4789 Encounter for other orthopedic aftercare: Secondary | ICD-10-CM

## 2011-06-05 DIAGNOSIS — G4730 Sleep apnea, unspecified: Secondary | ICD-10-CM | POA: Insufficient documentation

## 2011-06-05 DIAGNOSIS — I10 Essential (primary) hypertension: Secondary | ICD-10-CM | POA: Insufficient documentation

## 2011-06-05 DIAGNOSIS — Z87891 Personal history of nicotine dependence: Secondary | ICD-10-CM | POA: Insufficient documentation

## 2011-06-05 DIAGNOSIS — J4489 Other specified chronic obstructive pulmonary disease: Secondary | ICD-10-CM | POA: Insufficient documentation

## 2011-06-05 DIAGNOSIS — J449 Chronic obstructive pulmonary disease, unspecified: Secondary | ICD-10-CM | POA: Insufficient documentation

## 2011-06-05 DIAGNOSIS — M66329 Spontaneous rupture of flexor tendons, unspecified upper arm: Secondary | ICD-10-CM | POA: Insufficient documentation

## 2011-06-05 DIAGNOSIS — M75120 Complete rotator cuff tear or rupture of unspecified shoulder, not specified as traumatic: Secondary | ICD-10-CM | POA: Insufficient documentation

## 2011-06-05 DIAGNOSIS — E039 Hypothyroidism, unspecified: Secondary | ICD-10-CM | POA: Insufficient documentation

## 2011-06-05 DIAGNOSIS — M25819 Other specified joint disorders, unspecified shoulder: Secondary | ICD-10-CM | POA: Insufficient documentation

## 2011-06-05 DIAGNOSIS — K219 Gastro-esophageal reflux disease without esophagitis: Secondary | ICD-10-CM | POA: Insufficient documentation

## 2011-06-05 DIAGNOSIS — I4891 Unspecified atrial fibrillation: Secondary | ICD-10-CM | POA: Insufficient documentation

## 2011-06-05 DIAGNOSIS — M19019 Primary osteoarthritis, unspecified shoulder: Secondary | ICD-10-CM | POA: Insufficient documentation

## 2011-06-05 HISTORY — DX: Shortness of breath: R06.02

## 2011-06-05 HISTORY — DX: Chronic obstructive pulmonary disease, unspecified: J44.9

## 2011-06-05 LAB — POCT I-STAT, CHEM 8
BUN: 13 mg/dL (ref 6–23)
Calcium, Ion: 1.1 mmol/L — ABNORMAL LOW (ref 1.12–1.32)
Chloride: 102 mEq/L (ref 96–112)
HCT: 44 % (ref 39.0–52.0)
Sodium: 139 mEq/L (ref 135–145)

## 2011-06-05 SURGERY — SHOULDER ARTHROSCOPY WITH ROTATOR CUFF REPAIR AND SUBACROMIAL DECOMPRESSION
Anesthesia: General | Site: Shoulder | Laterality: Right | Wound class: Clean

## 2011-06-05 MED ORDER — KCL IN DEXTROSE-NACL 20-5-0.45 MEQ/L-%-% IV SOLN
INTRAVENOUS | Status: DC
  Administered 2011-06-05: 16:00:00 via INTRAVENOUS

## 2011-06-05 MED ORDER — CHLORHEXIDINE GLUCONATE 4 % EX LIQD: 60.0000 mL | Freq: Once | CUTANEOUS | Status: DC

## 2011-06-05 MED ORDER — PHENOL 1.4 % MT LIQD: 1.0000 | OROMUCOSAL | Status: DC | PRN

## 2011-06-05 MED ORDER — CEFAZOLIN SODIUM-DEXTROSE 2-3 GM-% IV SOLR
2.0000 g | INTRAVENOUS | Status: AC
  Administered 2011-06-05: 2 g via INTRAVENOUS

## 2011-06-05 MED ORDER — ONDANSETRON HCL 4 MG/2ML IJ SOLN: 4.0000 mg | Freq: Four times a day (QID) | INTRAMUSCULAR | Status: DC | PRN

## 2011-06-05 MED ORDER — POTASSIUM CHLORIDE IN NACL 20-0.45 MEQ/L-% IV SOLN: INTRAVENOUS | Status: DC

## 2011-06-05 MED ORDER — SUCCINYLCHOLINE CHLORIDE 20 MG/ML IJ SOLN
INTRAMUSCULAR | Status: DC | PRN
  Administered 2011-06-05: 100 mg via INTRAVENOUS

## 2011-06-05 MED ORDER — LACTATED RINGERS IV SOLN
INTRAVENOUS | Status: DC
  Administered 2011-06-05: 11:00:00 via INTRAVENOUS

## 2011-06-05 MED ORDER — CEFAZOLIN SODIUM 1-5 GM-% IV SOLN
1.0000 g | Freq: Two times a day (BID) | INTRAVENOUS | Status: DC
  Administered 2011-06-05: 1 g via INTRAVENOUS

## 2011-06-05 MED ORDER — MENTHOL 3 MG MT LOZG: 1.0000 | LOZENGE | OROMUCOSAL | Status: DC | PRN

## 2011-06-05 MED ORDER — HYDROCODONE-ACETAMINOPHEN 7.5-325 MG PO TABS
1.0000 | ORAL_TABLET | ORAL | Status: DC | PRN
  Administered 2011-06-06: 2 via ORAL

## 2011-06-05 MED ORDER — MIDAZOLAM HCL 2 MG/2ML IJ SOLN
1.0000 mg | INTRAMUSCULAR | Status: DC | PRN
  Administered 2011-06-05 (×2): 1 mg via INTRAVENOUS

## 2011-06-05 MED ORDER — ACETAMINOPHEN 650 MG RE SUPP: 650.0000 mg | Freq: Four times a day (QID) | RECTAL | Status: DC | PRN

## 2011-06-05 MED ORDER — BUPIVACAINE-EPINEPHRINE PF 0.5-1:200000 % IJ SOLN
INTRAMUSCULAR | Status: DC | PRN
  Administered 2011-06-05: 30 mL

## 2011-06-05 MED ORDER — ONDANSETRON HCL 4 MG/2ML IJ SOLN
INTRAMUSCULAR | Status: DC | PRN
  Administered 2011-06-05: 4 mg via INTRAVENOUS

## 2011-06-05 MED ORDER — SODIUM CHLORIDE 0.9 % IR SOLN
Status: DC | PRN
  Administered 2011-06-05: 3000 mL

## 2011-06-05 MED ORDER — PROPOFOL 10 MG/ML IV EMUL
INTRAVENOUS | Status: DC | PRN
  Administered 2011-06-05: 150 mg via INTRAVENOUS
  Administered 2011-06-05: 50 mg via INTRAVENOUS

## 2011-06-05 MED ORDER — ONDANSETRON HCL 4 MG PO TABS: 4.0000 mg | ORAL_TABLET | Freq: Four times a day (QID) | ORAL | Status: DC | PRN

## 2011-06-05 MED ORDER — HYDROMORPHONE HCL PF 1 MG/ML IJ SOLN
0.2500 mg | INTRAMUSCULAR | Status: DC | PRN
Start: 2011-06-05 — End: 2011-06-06

## 2011-06-05 MED ORDER — FENTANYL CITRATE 0.05 MG/ML IJ SOLN
50.0000 ug | INTRAMUSCULAR | Status: DC | PRN
  Administered 2011-06-05 (×2): 50 ug via INTRAVENOUS

## 2011-06-05 MED ORDER — ACETAMINOPHEN 325 MG PO TABS: 650.0000 mg | ORAL_TABLET | Freq: Four times a day (QID) | ORAL | Status: DC | PRN

## 2011-06-05 MED ORDER — PROMETHAZINE HCL 25 MG/ML IJ SOLN: 6.2500 mg | INTRAMUSCULAR | Status: DC | PRN

## 2011-06-05 MED ORDER — CEFAZOLIN SODIUM 1-5 GM-% IV SOLN: 1.0000 g | INTRAVENOUS | Status: DC

## 2011-06-05 SURGICAL SUPPLY — 73 items
APL SKNCLS STERI-STRIP NONHPOA (GAUZE/BANDAGES/DRESSINGS)
BENZOIN TINCTURE PRP APPL 2/3 (GAUZE/BANDAGES/DRESSINGS) IMPLANT
BLADE CUTTER GATOR 3.5 (BLADE) ×2 IMPLANT
BLADE CUTTER MENIS 5.5 (BLADE) IMPLANT
BLADE GREAT WHITE 4.2 (BLADE) ×2 IMPLANT
BLADE SURG 15 STRL LF DISP TIS (BLADE) IMPLANT
BLADE SURG 15 STRL SS (BLADE) ×2
BUR OVAL 6.0 (BURR) ×2 IMPLANT
CANISTER OMNI JUG 16 LITER (MISCELLANEOUS) ×2 IMPLANT
CANISTER SUCTION 2500CC (MISCELLANEOUS) IMPLANT
CANNULA TWIST IN 8.25X7CM (CANNULA) IMPLANT
CLOTH BEACON ORANGE TIMEOUT ST (SAFETY) ×2 IMPLANT
DECANTER SPIKE VIAL GLASS SM (MISCELLANEOUS) IMPLANT
DRAPE OEC MINIVIEW 54X84 (DRAPES) IMPLANT
DRAPE STERI 35X30 U-POUCH (DRAPES) ×2 IMPLANT
DRAPE U-SHAPE 47X51 STRL (DRAPES) ×2 IMPLANT
DRAPE U-SHAPE 76X120 STRL (DRAPES) ×4 IMPLANT
DRSG PAD ABDOMINAL 8X10 ST (GAUZE/BANDAGES/DRESSINGS) ×2 IMPLANT
DURAPREP 26ML APPLICATOR (WOUND CARE) ×2 IMPLANT
ELECT MENISCUS 165MM 90D (ELECTRODE) ×2 IMPLANT
ELECT NDL TIP 2.8 STRL (NEEDLE) IMPLANT
ELECT NEEDLE TIP 2.8 STRL (NEEDLE) IMPLANT
ELECT REM PT RETURN 9FT ADLT (ELECTROSURGICAL) ×2
ELECTRODE REM PT RTRN 9FT ADLT (ELECTROSURGICAL) ×1 IMPLANT
GAUZE XEROFORM 1X8 LF (GAUZE/BANDAGES/DRESSINGS) ×2 IMPLANT
GLOVE BIO SURGEON STRL SZ 6.5 (GLOVE) ×1 IMPLANT
GLOVE BIOGEL PI IND STRL 7.0 (GLOVE) IMPLANT
GLOVE BIOGEL PI IND STRL 8 (GLOVE) ×1 IMPLANT
GLOVE BIOGEL PI INDICATOR 7.0 (GLOVE) ×1
GLOVE BIOGEL PI INDICATOR 8 (GLOVE) ×1
GLOVE ORTHO TXT STRL SZ7.5 (GLOVE) ×4 IMPLANT
GOWN BRE IMP PREV XXLGXLNG (GOWN DISPOSABLE) ×2 IMPLANT
GOWN PREVENTION PLUS XLARGE (GOWN DISPOSABLE) ×2 IMPLANT
NDL SCORPION MULTI FIRE (NEEDLE) IMPLANT
NDL SUT 6 .5 CRC .975X.05 MAYO (NEEDLE) IMPLANT
NEEDLE MAYO TAPER (NEEDLE)
NEEDLE SCORPION MULTI FIRE (NEEDLE) IMPLANT
NS IRRIG 1000ML POUR BTL (IV SOLUTION) IMPLANT
PACK ARTHROSCOPY DSU (CUSTOM PROCEDURE TRAY) ×2 IMPLANT
PACK BASIN DAY SURGERY FS (CUSTOM PROCEDURE TRAY) ×2 IMPLANT
PASSER SUT SWANSON 36MM LOOP (INSTRUMENTS) IMPLANT
PENCIL BUTTON HOLSTER BLD 10FT (ELECTRODE) ×2 IMPLANT
SET ARTHROSCOPY TUBING (MISCELLANEOUS) ×2
SET ARTHROSCOPY TUBING LN (MISCELLANEOUS) ×1 IMPLANT
SLEEVE SCD COMPRESS KNEE MED (MISCELLANEOUS) ×1 IMPLANT
SLING ARM FOAM STRAP LRG (SOFTGOODS) IMPLANT
SLING ARM FOAM STRAP MED (SOFTGOODS) IMPLANT
SLING ARM FOAM STRAP XLG (SOFTGOODS) IMPLANT
SLING ARM IMMOBILIZER LRG (SOFTGOODS) IMPLANT
SLING ARM IMMOBILIZER MED (SOFTGOODS) IMPLANT
SPONGE GAUZE 4X4 12PLY (GAUZE/BANDAGES/DRESSINGS) ×3 IMPLANT
SPONGE LAP 4X18 X RAY DECT (DISPOSABLE) ×1 IMPLANT
STRIP CLOSURE SKIN 1/2X4 (GAUZE/BANDAGES/DRESSINGS) IMPLANT
SUCTION FRAZIER TIP 10 FR DISP (SUCTIONS) IMPLANT
SUT ETHIBOND 2 OS 4 DA (SUTURE) IMPLANT
SUT ETHILON 2 0 FS 18 (SUTURE) IMPLANT
SUT ETHILON 3 0 PS 1 (SUTURE) ×1 IMPLANT
SUT FIBERWIRE #2 38 T-5 BLUE (SUTURE)
SUT RETRIEVER MED (INSTRUMENTS) IMPLANT
SUT STEEL 4 (SUTURE) IMPLANT
SUT STEEL 5 (SUTURE) IMPLANT
SUT TIGER TAPE 7 IN WHITE (SUTURE) IMPLANT
SUT VIC AB 0 CT1 27 (SUTURE)
SUT VIC AB 0 CT1 27XBRD ANBCTR (SUTURE) IMPLANT
SUT VIC AB 2-0 SH 27 (SUTURE)
SUT VIC AB 2-0 SH 27XBRD (SUTURE) IMPLANT
SUT VIC AB 3-0 FS2 27 (SUTURE) IMPLANT
SUTURE FIBERWR #2 38 T-5 BLUE (SUTURE) IMPLANT
TAPE CLOTH SURG 6X10 WHT LF (GAUZE/BANDAGES/DRESSINGS) ×1 IMPLANT
TAPE FIBER 2MM 7IN #2 BLUE (SUTURE) IMPLANT
TOWEL OR 17X24 6PK STRL BLUE (TOWEL DISPOSABLE) ×2 IMPLANT
WATER STERILE IRR 1000ML POUR (IV SOLUTION) ×2 IMPLANT
YANKAUER SUCT BULB TIP NO VENT (SUCTIONS) IMPLANT

## 2011-06-05 NOTE — Anesthesia Preprocedure Evaluation (Signed)
Anesthesia Evaluation  Patient identified by MRN, date of birth, ID band Patient awake    Reviewed: Allergy & Precautions, H&P , NPO status , Patient's Chart, lab work & pertinent test results  Airway Mallampati: II TM Distance: >3 FB Neck ROM: Limited    Dental No notable dental hx. (+) Teeth Intact and Dental Advisory Given   Pulmonary sleep apnea and Continuous Positive Airway Pressure Ventilation , COPD COPD inhaler, former smoker (quit 1980's) clear to auscultation  Pulmonary exam normal       Cardiovascular hypertension, Pt. on medications + dysrhythmias (s/p ablation) Atrial Fibrillation Irregular Normal Cath 2005- preserved LVF, no coronary disease   Neuro/Psych Negative Neurological ROS     GI/Hepatic Neg liver ROS, GERD-  Medicated and Controlled,  Endo/Other  Hypothyroidism (on Synthroid) Morbid obesity  Renal/GU negative Renal ROS     Musculoskeletal   Abdominal (+) obese,   Peds  Hematology   Anesthesia Other Findings   Reproductive/Obstetrics                           Anesthesia Physical Anesthesia Plan  ASA: III  Anesthesia Plan: General   Post-op Pain Management:    Induction: Intravenous  Airway Management Planned: Oral ETT  Additional Equipment:   Intra-op Plan:   Post-operative Plan: Extubation in OR  Informed Consent: I have reviewed the patients History and Physical, chart, labs and discussed the procedure including the risks, benefits and alternatives for the proposed anesthesia with the patient or authorized representative who has indicated his/her understanding and acceptance.   Dental advisory given  Plan Discussed with: CRNA and Surgeon  Anesthesia Plan Comments: (Plan routine monitors, GETA with interscalene block for postop analgesia.  )        Anesthesia Quick Evaluation

## 2011-06-05 NOTE — Anesthesia Procedure Notes (Addendum)
Anesthesia Regional Block:  Interscalene brachial plexus block  Pre-Anesthetic Checklist: ,, timeout performed, Correct Patient, Correct Site, Correct Laterality, Correct Procedure, Correct Position, site marked, Risks and benefits discussed,  Surgical consent,  Pre-op evaluation,  At surgeon's request and post-op pain management  Laterality: Right  Prep: chloraprep       Needles:  Injection technique: Single-shot  Needle Type: Stimulator Needle - 40      Needle Gauge: 22 and 22 G    Additional Needles:  Procedures: nerve stimulator Interscalene brachial plexus block  Nerve Stimulator or Paresthesia:  Response: forearm twitch, 0.45 mA, 1 ms,   Additional Responses:   Narrative:  Start time: 06/05/2011 11:33 AM End time: 06/05/2011 11:39 AM Injection made incrementally with aspirations every 5 mL.  Performed by: Personally  Anesthesiologist: Sandford Craze, MD  Additional Notes: Pt identified in Holding room.  Monitors applied. Working IV access confirmed. Sterile prep.  #22ga PNS to forearm twitch at 0.67mA threshold.  30cc 0.5% Bupivacaine with 1:200k epi injected incrementally after negative test dose.  Patient asymptomatic, VSS, no heme aspirated, tolerated well.   Sandford Craze, MD  Interscalene brachial plexus block Procedure Name: Intubation Performed by: Sharyne Richters Pre-anesthesia Checklist: Patient identified, Timeout performed, Emergency Drugs available, Suction available and Patient being monitored Patient Re-evaluated:Patient Re-evaluated prior to inductionOxygen Delivery Method: Circle System Utilized Preoxygenation: Pre-oxygenation with 100% oxygen Intubation Type: IV induction Ventilation: Mask ventilation without difficulty Grade View: Grade I Tube type: Oral Tube size: 8.0 mm Number of attempts: 1 Placement Confirmation: ETT inserted through vocal cords under direct vision,  breath sounds checked- equal and bilateral and positive ETCO2 Secured at: 22  cm Tube secured with: Tape Dental Injury: Teeth and Oropharynx as per pre-operative assessment        Narrative:

## 2011-06-05 NOTE — Transfer of Care (Signed)
Immediate Anesthesia Transfer of Care Note  Patient: Richard Rosales  Procedure(s) Performed:  SHOULDER ARTHROSCOPY WITH ROTATOR CUFF REPAIR AND SUBACROMIAL DECOMPRESSION - arthroscopy right shoulder, decompression subacromial partial acromioplasty with coracoacromial release, distal claviculectomy rotator cuff repair  Patient Location: PACU  Anesthesia Type: General  Level of Consciousness: awake  Airway & Oxygen Therapy: Patient Spontanous Breathing and Patient connected to face mask oxygen  Post-op Assessment: Report given to PACU RN and Post -op Vital signs reviewed and stable  Post vital signs: Reviewed and stable  Complications: No apparent anesthesia complications

## 2011-06-05 NOTE — Interval H&P Note (Signed)
History and Physical Interval Note:  06/05/2011 7:02 AM  Richard Rosales  has presented today for surgery, with the diagnosis of right shoulder impingement, degenerative arthritis, a/c joint, complete rotator cuff rupture  The various methods of treatment have been discussed with the patient and family. After consideration of risks, benefits and other options for treatment, the patient has consented to  Procedure(s): SHOULDER ARTHROSCOPY WITH ROTATOR CUFF REPAIR AND SUBACROMIAL DECOMPRESSION as a surgical intervention .  The patients' history has been reviewed, patient examined, no change in status, stable for surgery.  I have reviewed the patients' chart and labs.  Questions were answered to the patient's satisfaction.     [name] F

## 2011-06-05 NOTE — Anesthesia Postprocedure Evaluation (Signed)
  Anesthesia Post-op Note  Patient: XIOMAR CROMPTON  Procedure(s) Performed:  SHOULDER ARTHROSCOPY WITH ROTATOR CUFF REPAIR AND SUBACROMIAL DECOMPRESSION - arthroscopy right shoulder, decompression subacromial partial acromioplasty with coracoacromial release, distal claviculectomy rotator cuff repair  Patient Location: PACU  Anesthesia Type: GA combined with regional for post-op pain  Level of Consciousness: awake, alert  and oriented  Airway and Oxygen Therapy: Patient Spontanous Breathing and Patient connected to nasal cannula oxygen  Post-op Pain: none  Post-op Assessment: Post-op Vital signs reviewed, Patient's Cardiovascular Status Stable, Respiratory Function Stable, Patent Airway, No signs of Nausea or vomiting and Pain level controlled  Post-op Vital Signs: Reviewed and stable  Complications: No apparent anesthesia complications

## 2011-06-05 NOTE — H&P (Signed)
[facility] [address]   [address] [place], [place] [address] [phone] A Division of [facility]  Loreta Ave, [name]     Robert A. Thurston Hole, [name]     Lunette Stands, [name] Eulas Post, [name]    Buford Dresser, [name] Estell Harpin, [name] Ralene Cork, D.O.          Genene Churn. Barry Dienes, PA-C            Kirstin A. Shepperson, PA-C Janace Litten, OPA-C   RE: [place], [place]                                [phone]      DOB: 25-Apr-1931 PROGRESS NOTE: 04-22-11 77 eight year-old white male who comes into the office today with complaints of right shoulder pain.  States that pain started about three weeks ago.  No problems before onset.  Pain aggravated with overhead activity and reaching behind his back.  Feels like discomfort is progressively worsening with decreased active range of motion.  No cervical spine or radicular component.  He has not had any surgical procedures on his shoulder.  No specific injury that he can recall, but states that around the time of onset he was reaching for something in his car and the shoulder began to bother him sometime after that.    EXAMINATION: Very pleasant, elderly white male, alert and oriented x 3 and in no acute distress.  Cervical spine unremarkable.  Right shoulder active flexion/abduction to about 80 degrees.  Passively I can get him to 160-170 degrees, but with marked discomfort.  Positive impingement test.  Decreased internal rotation behind his back.  Pain and weakness with supraspinatus resistance.  Neurovascularly intact.  Skin warm and dry.  No increase in respiratory effort.   X-RAYS: Right shoulder, AP, outlet and axillary views, show moderate to marked AC degenerative changes.  Type II acromion.  No acute changes.  He has some degenerative changes around the greater tuberosity.    IMPRESSION: Acute progressively worsening right shoulder pain.  Must rule out rotator  cuff tear.    PLAN:  With his worsening symptoms we will schedule an MRI scan to rule out rotator cuff tear.  He will follow up in the office after completion to discuss results.  We did offer conservative management with subacromial Depo-Medrol/Marcaine injection, but he states that about three months ago he was given bilateral deltoid Cortisone injections for bronchitis and he had some sort of systemic reaction, which makes him a little hesitant about having injection today.  We have performed bilateral knee Cortisone injections in the past without any issues.    Loreta Ave, [name]   Electronically verified by Loreta Ave, [name] DFM(JMO):jjh D 04-23-11 T 04-23-11 [facility] [address]   [address] [place], [place] [address] [phone] A Division of [facility]  Loreta Ave, [name]     Robert A. Thurston Hole, [name]     Lunette Stands, [name] Eulas Post, [name]    Buford Dresser, [name] Estell Harpin, [name] Ralene Cork, D.O.          Genene Churn. Barry Dienes, PA-C            Kirstin A. Shepperson, PA-C [place], OPA-C   RE: [place]et[place], [place]   [phone]      DOB: 1931/01/08 PROGRESS  NOTE: 05-02-11 77 year old white male returns to review MRI performed 04/29/11. This showed large full thickness supraspinatus tear 3.5 cm. retracted longhead biceps tendon. Generated and torn glenoid labrum. Advanced AC joint arthritis. Shoulder pain is unchanged. He has pain and weakness going overhead. He has multiple medical issues and has primary care physician cardiologist and pulmonologist.  DISPOSITION: He's advised with symptoms and MRI findings only viable option would be right shoulder arthroscopy debridement subacromial decompression distal clavicle excision and possible rotator cuff repair. With the retraction he has this may be irreparable. He'll need pre-op medical cardiac and pulmonology clearance. All questions  answered.  Loreta Ave, [name]  Electronically verified by Loreta Ave, [name] DFM(JMO):kh cc:  [name], MD fax [phone] cc:  Hillis Range, MD fax [phone] cc:  Levy Pupa, MD fax [phone] D 05-05-11 T 05-05-11

## 2011-06-05 NOTE — Brief Op Note (Signed)
06/05/2011  1:03 PM  PATIENT:  Richard Rosales  77 y.o. male  PRE-OPERATIVE DIAGNOSIS:  right shoulder impingement, degenerative arthritis acrmio-clavicualr joint, complete rotator cuff rupture  POST-OPERATIVE DIAGNOSIS:  right shoulder impingement, degenerative arthritis acrmio-clavicualr joint, complete rotator cuff rupture  PROCEDURE:  Procedure(s): Right SHOULDER ARTHROSCOPY WITH debridement irreparable  ROTATOR CUFF tear  AND SUBACROMIAL DECOMPRESSION, dce  SURGEON:  Surgeon(s): Loreta Ave, MD  PHYSICIAN ASSISTANT: Zonia Kief M   ANESTHESIA:   general   SPECIMEN:  No Specimen  DISPOSITION OF SPECIMEN:  N/A  COUNTS:  YES   PATIENT DISPOSITION:  PACU - hemodynamically stable.

## 2011-06-05 NOTE — Progress Notes (Signed)
Assisted Dr. Jackson with right, interscalene  block. Side rails up, monitors on throughout procedure. See vital signs in flow sheet. Tolerated Procedure well. 

## 2011-06-06 NOTE — Op Note (Signed)
NAME:  Richard Rosales, COY NO.:  MEDICAL RECORD NO.:  [id number]  LOCATION:                                 FACILITY:  PHYSICIAN:  Loreta Ave, [name]      DATE OF BIRTH:  DATE OF PROCEDURE: DATE OF DISCHARGE:                              OPERATIVE REPORT   PREOPERATIVE DIAGNOSES:  Right shoulder rotator cuff tear.  Biceps tendon tear.  Impingement.  Degenerative joint disease of acromioclavicular joint.  POSTOPERATIVE DIAGNOSES:  Right shoulder rotator cuff tear.  Biceps tendon tear.  Impingement.  Degenerative joint disease of acromioclavicular joint with markedly retracted complete tear, supraspinatus top half of infraspinatus and subscap tendons.  Completely irreparable.  Complete rupture long head biceps tendon, retracted on the shoulder.  PROCEDURE:  Right shoulder exam under anesthesia, arthroscopy. Debridement of rotator cuff, labrum, and biceps tendon stump. Acromioplasty, partially CA ligament.  Excision of distal clavicle.  SURGEON:  Loreta Ave, MD  ASSISTANT:  Genene Churn. Barry Dienes, [place], present throughout the entire case, necessary for timely completion of procedure.  ANESTHESIA:  General.  BLOOD LOSS:  Minimal.  SPECIMENS:  None.  CULTURES:  None.  COMPLICATIONS:  None.  DRESSING:  Soft compressive with sling.  PROCEDURE:  The patient was brought to the operating room and placed on the operating table in supine position.  After adequate anesthesia had been obtained, arm was examined.  Full motion, stable shoulder.  Placed in a beach-chair position on the shoulder positioner and prepped and draped in usual sterile fashion.  Three portals; anterior, posterior, lateral.  Arthroscope introduced, shoulder distended and inspected. Complete rupture entire supraspinatus top half of the infraspinatus and subscap.  Retracted back to the level of glenoid.  What was left was markedly degenerative, not able to be mobilized, and not able to  be repaired at all.  I could not advance the front or back up over the top at all.  Some complex tearing labrum debrided.  Most of the glenohumeral joint had grade 2, not extensive changes.  After thoroughly debriding rotator cuff, biceps, and labrum, subacromial space was visualized.  All of this in space given the size of the tear.  Acromioplasty from type 3 to a type 1 acromion releasing enough CA ligament to facilitate this, but keeping some intact to prevent anterior escape.  Grade 4 changes with spurring AC joint.  Periarticular spurs and lateral centimeter of clavicle resected.  Adequacy of decompression confirmed viewing from all portals.  Cuff was thoroughly assessed and again repaired, not an option at all.  Instruments were removed.  Portals of shoulder bursa injected with Marcaine.  Portals closed with 4-0 nylon.  Sterile compressive dressing applied.  Sling applied.  Anesthesia reversed.  Brought to the recovery room.  Tolerated the surgery well.  No complications.     Loreta Ave, [name]     DFM/MEDQ  D:  06/05/2011  T:  06/05/2011  Job:  [id number]

## 2011-06-13 ENCOUNTER — Emergency Department (INDEPENDENT_AMBULATORY_CARE_PROVIDER_SITE_OTHER)
Admission: EM | Admit: 2011-06-13 | Discharge: 2011-06-13 | Disposition: A | Discharge status: Home / Self Care | Payer: Medicare Other | Source: Home / Self Care | Attending: Family Medicine | Admitting: Family Medicine

## 2011-06-13 ENCOUNTER — Other Ambulatory Visit: Payer: Self-pay

## 2011-06-13 ENCOUNTER — Encounter (HOSPITAL_COMMUNITY): Payer: Self-pay | Admitting: *Deleted

## 2011-06-13 DIAGNOSIS — J069 Acute upper respiratory infection, unspecified: Secondary | ICD-10-CM

## 2011-06-13 NOTE — ED Provider Notes (Signed)
History     CSN: [id number]  Arrival date & time 06/13/11  1220   First MD Initiated Contact with Patient 06/13/11 1235      Chief Complaint  Patient presents with  . Abdominal Pain    (Consider location/radiation/quality/duration/timing/severity/associated sxs/prior treatment) HPI Comments: The patient reports he was at his orthopedists office today for a follow up visit and when asked if he had had any discomfort told his orho that he had had some soreness in his sides and groin that resolved. With his cardiac and pulmonary history he was sent here for eval . The patient reports he feels great. No shortness of breath, no chest discomfort other that form recent right shoulder surgery.  The history is provided by the patient.    Past Medical History  Diagnosis Date  . HTN (hypertension)   . Diastolic dysfunction   . Atrial fibrillation   . GERD (gastroesophageal reflux disease)   . Allergic rhinitis   . Hypothyroidism   . DJD (degenerative joint disease)   . Diverticulosis of colon     recurrent GI bleeding  . OSA (obstructive sleep apnea)     compliants w/ CPAP  . Hyperplastic colon polyp 2003  . Chronic gastritis   . Positive PPD     remote  . Atrial flutter   . Acute bronchitis   . Anemia     iron deficient  . Shortness of breath   . COPD (chronic obstructive pulmonary disease)     Past Surgical History  Procedure Date  . Cardiac catheterization 7.27.05    revealed a preserved EF with no CAD  . Tonsillectomy   . Total knee arthroplasty     bilateral  . Nasal sinus surgery   . Colonoscopy   . Joint replacement 2001    lt total knee-rt one done 00  . Hernia repair     bilateral  . Hernia repair   . Transurethral resection of prostate     Family History  Problem Relation Age of Onset  . Hypertension Mother   . Diabetes Father   . Lung cancer Brother     chemical    History  Substance Use Topics  . Smoking status: Former Smoker -- 1.0 packs/day  for 20 years    Types: Cigarettes    Quit date: 08/01/1978  . Smokeless tobacco: Never Used  . Alcohol Use: Yes     a glass of wine a day      Review of Systems  Constitutional: Negative.   HENT: Positive for rhinorrhea.   Eyes: Negative.   Respiratory: Negative.   Cardiovascular: Negative.   Gastrointestinal: Negative.   Genitourinary: Negative.     Allergies  Advair hfa and Prednisone  Home Medications   Current Outpatient Rx  Name Route Sig Dispense Refill  . ASPIRIN 81 MG PO TABS Oral Take 81 mg by mouth daily. Take on mon,wed & Friday.     . B COMPLEX PO TABS Oral Take 1 tablet by mouth every morning.      . BUDESONIDE-FORMOTEROL FUMARATE 160-4.5 MCG/ACT IN AERO Inhalation Inhale 2 puffs into the lungs 2 (two) times daily. 3 Inhaler 3  . VITAMIN D 2000 UNITS PO CAPS Oral Take 1 capsule by mouth daily.      Marland Kitchen DILT-XR 240 MG PO CP24  TAKE 1 CAPSULE TWICE A DAY 180 capsule 1  . DIOVAN 320 MG PO TABS Oral Take 320 mg by mouth At bedtime.    Marland Kitchen  DOCUSATE SODIUM 100 MG PO CAPS Oral Take 100 mg by mouth 2 (two) times daily.      . OMEGA-3 FATTY ACIDS 1000 MG PO CAPS Oral Take 1 g by mouth 2 (two) times daily.      Marland Kitchen FLUTICASONE PROPIONATE 50 MCG/ACT NA SUSP  USE 2 SPRAYS IN EACH NOSTRIL TWICE A DAY 3 g 0  . FUROSEMIDE 40 MG PO TABS Oral Take 40 mg by mouth daily as needed.      Marland Kitchen GLUCOSAMINE HCL 1000 MG PO TABS Oral Take 1 tablet by mouth daily.      . GUAIFENESIN 400 MG PO TABS Oral Take 400 mg by mouth 2 (two) times daily.      . IRON 66 MG PO TABS Oral Take 1 tablet by mouth as directed. Take on Mon, Wed, & Friday.     Marland Kitchen LEVOXYL 100 MCG PO TABS Oral Take 100 mcg by mouth Every morning.    Marland Kitchen MINOXIDIL 10 MG PO TABS  1/2 tab by mouth at bedtime    . OMEPRAZOLE 20 MG PO CPDR Oral Take 20 mg by mouth Twice daily before meals.    Marland Kitchen PROVIGIL 200 MG PO TABS  1/2 tab by mouth on Wed and Sun when pt drives    . PSYLLIUM 28.3 % PO POWD  1 scoop twice daily       BP 147/90  Pulse  87  Temp(Src) 99 F (37.2 C) (Oral)  Resp 20  SpO2 97%  Physical Exam  Constitutional: He appears well-developed and well-nourished.  HENT:  Head: Normocephalic and atraumatic.  Neck: Normal range of motion. Neck supple.  Cardiovascular: Normal rate and regular rhythm.   Pulmonary/Chest: Effort normal and breath sounds normal.  Abdominal:       Obese . Non tender.   Musculoskeletal: He exhibits edema.       2 plus in lower extrem    ED Course  Procedures (including critical care time)  Labs Reviewed - No data to display No results found.   1. URI (upper respiratory infection)       MDM          Randa Spike, MD 06/13/11 1304

## 2011-06-13 NOTE — ED Notes (Signed)
Pt  Has    Numerous  Medical  Problems    He  Had  r  Rotator  Cuff  Surgery  8  Days  Ago    He  Was  In orthopedists  Office  Today  And  Told  Him  He  Had  Pain  Both  Sides    And    abd  Area  -  He  denys  Any  Chest pain   The  Dr  Wanted  Him to  Have  An  ekg  So  Now  He  Is  Here      He  Is  Awake  Alert  orinted  Old  Bruising  To  r  Shoulder     Area       Edema of  extrimitys    Skin is  Warm  dry

## 2011-06-25 DIAGNOSIS — M19019 Primary osteoarthritis, unspecified shoulder: Secondary | ICD-10-CM | POA: Diagnosis not present

## 2011-06-25 DIAGNOSIS — M75120 Complete rotator cuff tear or rupture of unspecified shoulder, not specified as traumatic: Secondary | ICD-10-CM | POA: Diagnosis not present

## 2011-06-25 DIAGNOSIS — M719 Bursopathy, unspecified: Secondary | ICD-10-CM | POA: Diagnosis not present

## 2011-06-27 DIAGNOSIS — M719 Bursopathy, unspecified: Secondary | ICD-10-CM | POA: Diagnosis not present

## 2011-06-27 DIAGNOSIS — M75120 Complete rotator cuff tear or rupture of unspecified shoulder, not specified as traumatic: Secondary | ICD-10-CM | POA: Diagnosis not present

## 2011-06-27 DIAGNOSIS — M19019 Primary osteoarthritis, unspecified shoulder: Secondary | ICD-10-CM | POA: Diagnosis not present

## 2011-06-30 DIAGNOSIS — M19019 Primary osteoarthritis, unspecified shoulder: Secondary | ICD-10-CM | POA: Diagnosis not present

## 2011-06-30 DIAGNOSIS — M67919 Unspecified disorder of synovium and tendon, unspecified shoulder: Secondary | ICD-10-CM | POA: Diagnosis not present

## 2011-06-30 DIAGNOSIS — M75120 Complete rotator cuff tear or rupture of unspecified shoulder, not specified as traumatic: Secondary | ICD-10-CM | POA: Diagnosis not present

## 2011-06-30 DIAGNOSIS — M719 Bursopathy, unspecified: Secondary | ICD-10-CM | POA: Diagnosis not present

## 2011-07-02 DIAGNOSIS — M67919 Unspecified disorder of synovium and tendon, unspecified shoulder: Secondary | ICD-10-CM | POA: Diagnosis not present

## 2011-07-02 DIAGNOSIS — M19019 Primary osteoarthritis, unspecified shoulder: Secondary | ICD-10-CM | POA: Diagnosis not present

## 2011-07-02 DIAGNOSIS — M75120 Complete rotator cuff tear or rupture of unspecified shoulder, not specified as traumatic: Secondary | ICD-10-CM | POA: Diagnosis not present

## 2011-07-14 ENCOUNTER — Telehealth: Payer: Self-pay | Admitting: Emergency Medicine

## 2011-07-14 DIAGNOSIS — M75120 Complete rotator cuff tear or rupture of unspecified shoulder, not specified as traumatic: Secondary | ICD-10-CM | POA: Diagnosis not present

## 2011-07-14 DIAGNOSIS — M719 Bursopathy, unspecified: Secondary | ICD-10-CM | POA: Diagnosis not present

## 2011-07-14 DIAGNOSIS — M19019 Primary osteoarthritis, unspecified shoulder: Secondary | ICD-10-CM | POA: Diagnosis not present

## 2011-07-14 NOTE — Telephone Encounter (Signed)
Spoke with pt. He states has been having increased SOB the past couple wks. Has been getting out of breath walking to his mailbox and has to stop and rest. He states that this is new for him, and feels that his breathing is progressively getting worse.  Ov with TP tomorrow at 3:30 pm and advised to seek emergency care sooner if needed.

## 2011-07-15 ENCOUNTER — Encounter: Payer: Self-pay | Admitting: Adult Health

## 2011-07-15 ENCOUNTER — Other Ambulatory Visit (INDEPENDENT_AMBULATORY_CARE_PROVIDER_SITE_OTHER): Payer: Medicare Other

## 2011-07-15 ENCOUNTER — Ambulatory Visit (INDEPENDENT_AMBULATORY_CARE_PROVIDER_SITE_OTHER): Payer: Medicare Other | Admitting: Adult Health

## 2011-07-15 VITALS — BP 112/72 | HR 53 | Temp 97.0°F | Ht 69.0 in | Wt 291.2 lb

## 2011-07-15 DIAGNOSIS — R0602 Shortness of breath: Secondary | ICD-10-CM

## 2011-07-15 DIAGNOSIS — I5032 Chronic diastolic (congestive) heart failure: Secondary | ICD-10-CM | POA: Diagnosis not present

## 2011-07-15 DIAGNOSIS — I509 Heart failure, unspecified: Secondary | ICD-10-CM | POA: Diagnosis not present

## 2011-07-15 NOTE — Progress Notes (Signed)
Subjective:    Patient ID: Richard Rosales, male    DOB: 06-Aug-1930, 78 y.o.   MRN: [id number]  HPI  PCP- [facility] - [facility]  78 yo man, former smoker(quit'80)  For FU of COPD &  OSA on CPAP 12 cm. He has  hx of HTN, A Fib, chronic LE edema, GERD, diverticulosis, remote positive PPD.   TTE with preserved LVEF, PASP 34, not able to comment on diastolic fxn. PFTs 5/11  Showed moderate airway obstruction with FEv1 55% & good BD respnse - improved to 67%  05/06/11  Needs clearance for rotator cuff surgery - Dr Eulah Pont He can walk 100 yards - limited by osteoarthritis in feet. Also has balance issues . Occasional morning wheeze. Rides exercise bike x daily at own pace. Last bronchitis was in 6/12 , required steroids- dexa, steroids make him shake  Not a true allergy (cannot use prednisone)  >>cleared for surgery    07/15/2011 Acute OV  Complains of increased SOB,   fatigue/lethargy x1.5weeks . Some dry cough. No discolored mucus no fever. Increased leg swelling esp in evenings. Does not take lasix on consistent days.  No orthopnea. No wheezing. No chest pain. Weight trending up. Wears CPAP each night.  Previous echo showed preserved LV fxn, mod RA dilatation. Unable to comment on diastolic dysfxn.   Maintains on Symbiocrt  Twice daily   Underwent rotator cuff surgery 05/2011- recovering well with PT weekly.   Past Medical History  Diagnosis Date  . HTN (hypertension)   . Diastolic dysfunction   . Atrial fibrillation   . GERD (gastroesophageal reflux disease)   . Allergic rhinitis   . Hypothyroidism   . DJD (degenerative joint disease)   . Diverticulosis of colon     recurrent GI bleeding  . OSA (obstructive sleep apnea)     compliants w/ CPAP  . Hyperplastic colon polyp 2003  . Chronic gastritis   . Positive PPD     remote  . Atrial flutter   . Acute bronchitis   . Anemia     iron deficient  . Shortness of breath   . COPD (chronic obstructive pulmonary disease)      Past Surgical History  Procedure Date  . Cardiac catheterization 7.27.05    revealed a preserved EF with no CAD  . Tonsillectomy   . Total knee arthroplasty     bilateral  . Nasal sinus surgery   . Colonoscopy   . Joint replacement 2001    lt total knee-rt one done 00  . Hernia repair     bilateral  . Hernia repair   . Transurethral resection of prostate     Review of Systems  Constitutional:   No  weight loss, night sweats,  Fevers, chills,  +fatigue, or  lassitude.  HEENT:   No headaches,  Difficulty swallowing,  Tooth/dental problems, or  Sore throat,                No sneezing, itching, ear ache, nasal congestion, post nasal drip,   CV:  No chest pain,  Orthopnea, PND  anasarca, dizziness, palpitations, syncope.   GI  No heartburn, indigestion, abdominal pain, nausea, vomiting, diarrhea, change in bowel habits, loss of appetite, bloody stools.   Resp:    No excess mucus, no productive cough,  No non-productive cough,  No coughing up of blood.  No change in color of mucus.  No wheezing.  No chest wall deformity  Skin: no rash or  lesions.  GU: no dysuria, change in color of urine, no urgency or frequency.  No flank pain, no hematuria   MS:  No joint pain or swelling.  No decreased range of motion.  No back pain.  Psych:  No change in mood or affect. No depression or anxiety.  No memory loss.          Objective:   Physical Exam  Gen. Pleasant, morbidly obese in no distress, normal affect ENT - no lesions, no post nasal drip, class 3 airway Neck: No JVD, no thyromegaly, no carotid bruits Lungs: no use of accessory muscles, no dullness to percussion, clear without rales or rhonchi  Cardiovascular: Rhythm regular, heart sounds  normal, no murmurs or gallops, 2+  peripheral edema Abdomen: soft and non-tender, no hepatosplenomegaly, BS normal. Musculoskeletal: No deformities, no cyanosis or clubbing Neuro:  alert, non focal  CBC    Component Value Date/Time    WBC 9.0 01/15/2011 1450   WBC 6.5 11/26/2010 0942   RBC 4.62 01/15/2011 1450   RBC 4.12* 11/26/2010 0942   HGB 15.0 06/05/2011 1059   HGB 10.8* 11/26/2010 0942   HCT 44.0 06/05/2011 1059   HCT 34.5* 11/26/2010 0942   PLT 300.0 01/15/2011 1450   PLT 241 11/26/2010 0942   MCV 81.1 01/15/2011 1450   MCV 83.7 11/26/2010 0942   MCH 26.2* 11/26/2010 0942   MCH 31.0 10/05/2010 1440   MCHC 32.5 01/15/2011 1450   MCHC 31.3* 11/26/2010 0942   RDW 18.7* 01/15/2011 1450   RDW 19.8* 11/26/2010 0942   LYMPHSABS 2.0 01/15/2011 1450   LYMPHSABS 1.2 11/26/2010 0942   MONOABS 0.9 01/15/2011 1450   MONOABS 0.7 11/26/2010 0942   EOSABS 0.3 01/15/2011 1450   EOSABS 0.3 11/26/2010 0942   BASOSABS 0.1 01/15/2011 1450   BASOSABS 0.1 11/26/2010 0942    BMET    Component Value Date/Time   NA 139 06/05/2011 1059   K 3.8 06/05/2011 1059   CL 102 06/05/2011 1059   CO2 31 01/15/2011 1450   GLUCOSE 102* 06/05/2011 1059   BUN 13 06/05/2011 1059   CREATININE 0.80 06/05/2011 1059   CALCIUM 8.6 01/15/2011 1450   GFRNONAA >60 10/05/2010 0630   GFRAA  Value: >60        The eGFR has been calculated using the MDRD equation. This calculation has not been validated in all clinical situations. eGFR's persistently <60 mL/min signify possible Chronic Kidney Disease. 10/05/2010 0630          Assessment & Plan:

## 2011-07-15 NOTE — Assessment & Plan Note (Addendum)
Suspect decompensated diastolic overload /CHF  Will increase diuresis briefly.  Labs pending.   Plan;  Take Lasix 40mg  daily  2 tabs daily for 3 days then back to once daily (dont miss any doses)  Low salt diet  I will call with labs.  Please contact office for sooner follow up if symptoms do not improve or worsen or seek emergency care   follow up DR. Byrum in 3 weeks as planned and As needed   Low salt diet  Keep legs elevated.

## 2011-07-15 NOTE — Patient Instructions (Addendum)
Take Lasix 40mg  daily  2 tabs daily for 3 days then back to once daily (dont miss any doses)  Low salt diet  I will call with labs.  Please contact office for sooner follow up if symptoms do not improve or worsen or seek emergency care   follow up DR. Byrum in 3 weeks as planned and As needed   Low salt diet  Keep legs elevated.

## 2011-07-16 LAB — BASIC METABOLIC PANEL
Chloride: 102 mEq/L (ref 96–112)
Potassium: 4.4 mEq/L (ref 3.5–5.1)

## 2011-07-17 LAB — BRAIN NATRIURETIC PEPTIDE: Pro B Natriuretic peptide (BNP): 418 pg/mL — ABNORMAL HIGH (ref 0.0–100.0)

## 2011-07-22 DIAGNOSIS — M19019 Primary osteoarthritis, unspecified shoulder: Secondary | ICD-10-CM | POA: Diagnosis not present

## 2011-07-22 DIAGNOSIS — Z4789 Encounter for other orthopedic aftercare: Secondary | ICD-10-CM | POA: Diagnosis not present

## 2011-07-25 DIAGNOSIS — M719 Bursopathy, unspecified: Secondary | ICD-10-CM | POA: Diagnosis not present

## 2011-07-25 DIAGNOSIS — M67919 Unspecified disorder of synovium and tendon, unspecified shoulder: Secondary | ICD-10-CM | POA: Diagnosis not present

## 2011-07-25 DIAGNOSIS — M75120 Complete rotator cuff tear or rupture of unspecified shoulder, not specified as traumatic: Secondary | ICD-10-CM | POA: Diagnosis not present

## 2011-07-25 DIAGNOSIS — M19019 Primary osteoarthritis, unspecified shoulder: Secondary | ICD-10-CM | POA: Diagnosis not present

## 2011-07-28 ENCOUNTER — Ambulatory Visit (INDEPENDENT_AMBULATORY_CARE_PROVIDER_SITE_OTHER): Payer: Medicare Other | Admitting: Internal Medicine

## 2011-07-28 ENCOUNTER — Encounter: Payer: Self-pay | Admitting: Internal Medicine

## 2011-07-28 DIAGNOSIS — I5032 Chronic diastolic (congestive) heart failure: Secondary | ICD-10-CM | POA: Diagnosis not present

## 2011-07-28 DIAGNOSIS — I509 Heart failure, unspecified: Secondary | ICD-10-CM

## 2011-07-28 DIAGNOSIS — I4892 Unspecified atrial flutter: Secondary | ICD-10-CM

## 2011-07-28 NOTE — Progress Notes (Signed)
The patient presents today for routine cardiology followup.  Since last being seen in our clinic, the patient reports doing reasonably well.  He recently presented to the pulmonary clinic with significant volume overload.  His lasix was increased to 40mg  daily and he diuresed 12 lbs back to near his baseline weight.  He feels that his SOB has significantly improved with this. Today, he denies symptoms of palpitations, chest pain, shortness of breath (above baseline),  dizziness, presyncope, syncope, or neurologic sequela.  He has stable edema.  The patient feels that he is tolerating medications without difficulties and is otherwise without complaint today.   Past Medical History  Diagnosis Date  . HTN (hypertension)   . Diastolic dysfunction   . Atrial fibrillation   . GERD (gastroesophageal reflux disease)   . Allergic rhinitis   . Hypothyroidism   . DJD (degenerative joint disease)   . Diverticulosis of colon     recurrent GI bleeding  . OSA (obstructive sleep apnea)     compliants w/ CPAP  . Hyperplastic colon polyp 2003  . Chronic gastritis   . Positive PPD     remote  . Atrial flutter   . Acute bronchitis   . Anemia     iron deficient  . Shortness of breath   . COPD (chronic obstructive pulmonary disease)    Past Surgical History  Procedure Date  . Cardiac catheterization 7.27.05    revealed a preserved EF with no CAD  . Tonsillectomy   . Total knee arthroplasty     bilateral  . Nasal sinus surgery   . Colonoscopy   . Joint replacement 2001    lt total knee-rt one done 00  . Hernia repair     bilateral  . Hernia repair   . Transurethral resection of prostate     Current Outpatient Prescriptions  Medication Sig Dispense Refill  . aspirin 81 MG tablet Take by mouth daily.       Marland Kitchen b complex vitamins tablet Take 1 tablet by mouth every morning.        . budesonide-formoterol (SYMBICORT) 160-4.5 MCG/ACT inhaler Inhale 2 puffs into the lungs 2 (two) times daily.  3  Inhaler  3  . Cholecalciferol (VITAMIN D) 2000 UNITS CAPS Take 1 capsule by mouth daily.        Marland Kitchen DILT-XR 240 MG 24 hr capsule TAKE 1 CAPSULE TWICE A DAY  180 capsule  1  . DIOVAN 320 MG tablet Take 320 mg by mouth At bedtime.      . docusate sodium (COLACE) 100 MG capsule Take 100 mg by mouth 2 (two) times daily.        . fish oil-omega-3 fatty acids 1000 MG capsule Take 1 g by mouth 2 (two) times daily.        . fluticasone (FLONASE) 50 MCG/ACT nasal spray USE 2 SPRAYS IN EACH NOSTRIL TWICE A DAY  3 g  0  . furosemide (LASIX) 40 MG tablet Take by mouth daily.       . Glucosamine HCl 1000 MG TABS Take 1 tablet by mouth daily.        Marland Kitchen guaifenesin (HUMIBID E) 400 MG TABS Take 400 mg by mouth 2 (two) times daily.        . Iron 66 MG TABS Take 1 tablet by mouth daily.       Marland Kitchen LEVOXYL 100 MCG tablet Take 100 mcg by mouth Every morning.      . minoxidil (  LONITEN) 10 MG tablet 1/2 tab by mouth at bedtime      . omeprazole (PRILOSEC) 20 MG capsule Take 20 mg by mouth Twice daily before meals.      Marland Kitchen PROVIGIL 200 MG tablet 1/2 tab by mouth on Wed and Sun when pt drives      . Psyllium (METAMUCIL) 28.3 % POWD 1 scoop twice daily         Allergies  Allergen Reactions  . Advair Hfa     Unable to remember per pt  . Prednisone     Shakes / tremors    History   Social History  . Marital Status: Married    Spouse Name: N/A    Number of Children: 4  . Years of Education: N/A   Occupational History  . part-time as a Education officer, environmental at [employer]    Social History Main Topics  . Smoking status: Former Smoker -- 1.0 packs/day for 20 years    Types: Cigarettes    Quit date: 08/01/1978  . Smokeless tobacco: Never Used  . Alcohol Use: Yes     a glass of wine a day  . Drug Use: No  . Sexually Active: Not on file   Other Topics Concern  . 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    Family History  Problem Relation Age of Onset  . Hypertension Mother   . Diabetes Father   . Lung cancer Brother     chemical     Physical Exam: Filed Vitals:   07/28/11 1233  BP: 133/80  Pulse: 88  Resp: 20  Height: 5\' 9"  (1.753 m)  Weight: 289 lb (131.09 kg)    GEN- The patient is overweight appearing, alert and oriented x 3 today.   Head- normocephalic, atraumatic Eyes-  Sclera clear, conjunctiva pink Ears- hearing intact Oropharynx- clear Neck- supple, JVP 9cm Lymph- no cervical lymphadenopathy Lungs- Clear to ausculation bilaterally, normal work of breathing, few scattered wheezes Heart- irregular rate and rhythm, no murmurs, rubs or gallops, PMI not laterally displaced GI- soft, NT, ND, + BS Extremities- no clubbing, cyanosis, 1+ edema MS- walks slowly with a cane Skin- no rash or lesion Psych- euthymic mood, full affect Neuro- strength and sensation are intact  Assessment and Plan:

## 2011-07-28 NOTE — Patient Instructions (Signed)
Your physician has recommended you make the following change in your medication:  1) Increase lasix (furosemide) 40 mg to one tablet twice daily on Tuesday & Wednesday this week, then resume your normal dosing.  Your physician wants you to follow-up in: 6 months with Dr. Johney Frame. You will receive a reminder letter in the mail two months in advance. If you don't receive a letter, please call our office to schedule the follow-up appointment.

## 2011-07-28 NOTE — Assessment & Plan Note (Signed)
He has persistent atrial fibrillation and atrial flutter Due to recurrent GI bleeding, he is not a coumadin candidate He is reasonably rate controlled.

## 2011-07-28 NOTE — Assessment & Plan Note (Signed)
Recently with volume overload, but now improved Still slightly above dry weight.  I have increased lasix to 40mg  BID x 2 days, then resume 40mg  daily Daily weights and salt restriction are advised

## 2011-07-29 DIAGNOSIS — M19019 Primary osteoarthritis, unspecified shoulder: Secondary | ICD-10-CM | POA: Diagnosis not present

## 2011-07-29 DIAGNOSIS — M75120 Complete rotator cuff tear or rupture of unspecified shoulder, not specified as traumatic: Secondary | ICD-10-CM | POA: Diagnosis not present

## 2011-07-29 DIAGNOSIS — M67919 Unspecified disorder of synovium and tendon, unspecified shoulder: Secondary | ICD-10-CM | POA: Diagnosis not present

## 2011-08-01 DIAGNOSIS — M719 Bursopathy, unspecified: Secondary | ICD-10-CM | POA: Diagnosis not present

## 2011-08-01 DIAGNOSIS — M19019 Primary osteoarthritis, unspecified shoulder: Secondary | ICD-10-CM | POA: Diagnosis not present

## 2011-08-01 DIAGNOSIS — M75120 Complete rotator cuff tear or rupture of unspecified shoulder, not specified as traumatic: Secondary | ICD-10-CM | POA: Diagnosis not present

## 2011-08-04 ENCOUNTER — Ambulatory Visit (INDEPENDENT_AMBULATORY_CARE_PROVIDER_SITE_OTHER): Payer: Medicare Other | Admitting: Emergency Medicine

## 2011-08-04 ENCOUNTER — Encounter: Payer: Self-pay | Admitting: Emergency Medicine

## 2011-08-04 DIAGNOSIS — I5032 Chronic diastolic (congestive) heart failure: Secondary | ICD-10-CM

## 2011-08-04 DIAGNOSIS — J449 Chronic obstructive pulmonary disease, unspecified: Secondary | ICD-10-CM | POA: Diagnosis not present

## 2011-08-04 DIAGNOSIS — I509 Heart failure, unspecified: Secondary | ICD-10-CM

## 2011-08-04 MED ORDER — PREDNISONE 10 MG PO TABS: ORAL_TABLET | ORAL | Status: DC

## 2011-08-04 NOTE — Progress Notes (Signed)
  Subjective:    Patient ID: Richard Rosales, male    DOB: [birth year], 78 y.o.   MRN: [id number]  HPI PCP- [facility]  78 yo man, former smoker(quit'80)  For FU of COPD &  OSA on CPAP 12 cm. He has  hx of HTN, A Fib, chronic LE edema, GERD, diverticulosis, remote positive PPD.   TTE with preserved LVEF, PASP 34, not able to comment on diastolic fxn. PFTs 5/11  Showed moderate airway obstruction with FEv1 55% & good BD respnse - improved to 67%  05/06/11  Needs clearance for rotator cuff surgery - Dr Eulah Pont He can walk 100 yards - limited by osteoarthritis in feet. Also has balance issues . Occasional morning wheeze. Rides exercise bike x daily at own pace. Last bronchitis was in 6/12 , required steroids- dexa, steroids make him shake  Not a true allergy (cannot use prednisone)  >>cleared for surgery   07/15/2011 Acute OV  Complains of increased SOB,   fatigue/lethargy x1.5weeks . Some dry cough. No discolored mucus no fever. Increased leg swelling esp in evenings. Does not take lasix on consistent days.  No orthopnea. No wheezing. No chest pain. Weight trending up. Wears CPAP each night.  Previous echo showed preserved LV fxn, mod RA dilatation. Unable to comment on diastolic dysfxn.   Maintains on Symbiocrt  Twice daily   Underwent rotator cuff surgery 05/2011- recovering well with PT weekly.   ROV 08/04/11 -- Hx OSA, COPD on Symbicort. Also A flutter, chronic diastolic CHF.  Saw Dr Johney Frame 2/4 and had good rate control. Was experiecing some "jitteriness" yesterday, no syncope or pre-syncope. He has been taking Symbicort long term, hard to blame it on that. The jitters may have related to exertion. He has about 3-4 days of sore throat and clear mucous.    Objective:   Physical Exam  Gen. Pleasant, morbidly obese in no distress, normal affect ENT - no lesions, no post nasal drip, class 3 airway Neck: No JVD, no thyromegaly, throat wheeze on expiration Lungs: no use of  accessory muscles, B expiratory wheezes Cardiovascular: Rhythm regular, heart sounds  normal, no murmurs or gallops, 1-2+  peripheral edema Musculoskeletal: No deformities, no cyanosis or clubbing Neuro:  alert, non focal      Assessment & Plan:  Chronic diastolic heart failure Followed by Dr Johney Frame, has needed increased lasix a couple times in last 2 months, still with LE edema. Wonder if he is going to have to have his regimen adjusted by Dr A. Iwill refer him for this if we aren't improving after addressing his pulm issues  COPD Wheeze today in setting probable URI.  - short taper prednisone - continue chronic inhaled regimen - rov 1 month

## 2011-08-04 NOTE — Assessment & Plan Note (Signed)
Followed by Dr Johney Frame, has needed increased lasix a couple times in last 2 months, still with LE edema. Wonder if he is going to have to have his regimen adjusted by Dr A. Iwill refer him for this if we aren't improving after addressing his pulm issues

## 2011-08-04 NOTE — Assessment & Plan Note (Signed)
Wheeze today in setting probable URI.  - short taper prednisone - continue chronic inhaled regimen - rov 1 month

## 2011-08-04 NOTE — Patient Instructions (Addendum)
We will prescribe a short course of prednisone.  Walking oximetry today Continue your inhaled medications.  Follow with Dr Delton Coombes in 1 month

## 2011-08-08 DIAGNOSIS — M67919 Unspecified disorder of synovium and tendon, unspecified shoulder: Secondary | ICD-10-CM | POA: Diagnosis not present

## 2011-08-08 DIAGNOSIS — M19019 Primary osteoarthritis, unspecified shoulder: Secondary | ICD-10-CM | POA: Diagnosis not present

## 2011-08-08 DIAGNOSIS — M75120 Complete rotator cuff tear or rupture of unspecified shoulder, not specified as traumatic: Secondary | ICD-10-CM | POA: Diagnosis not present

## 2011-08-08 DIAGNOSIS — M719 Bursopathy, unspecified: Secondary | ICD-10-CM | POA: Diagnosis not present

## 2011-08-12 DIAGNOSIS — M75120 Complete rotator cuff tear or rupture of unspecified shoulder, not specified as traumatic: Secondary | ICD-10-CM | POA: Diagnosis not present

## 2011-08-12 DIAGNOSIS — M19019 Primary osteoarthritis, unspecified shoulder: Secondary | ICD-10-CM | POA: Diagnosis not present

## 2011-08-12 DIAGNOSIS — M719 Bursopathy, unspecified: Secondary | ICD-10-CM | POA: Diagnosis not present

## 2011-08-12 DIAGNOSIS — M67919 Unspecified disorder of synovium and tendon, unspecified shoulder: Secondary | ICD-10-CM | POA: Diagnosis not present

## 2011-08-19 DIAGNOSIS — M67919 Unspecified disorder of synovium and tendon, unspecified shoulder: Secondary | ICD-10-CM | POA: Diagnosis not present

## 2011-08-19 DIAGNOSIS — M19019 Primary osteoarthritis, unspecified shoulder: Secondary | ICD-10-CM | POA: Diagnosis not present

## 2011-08-19 DIAGNOSIS — M75120 Complete rotator cuff tear or rupture of unspecified shoulder, not specified as traumatic: Secondary | ICD-10-CM | POA: Diagnosis not present

## 2011-08-19 DIAGNOSIS — M719 Bursopathy, unspecified: Secondary | ICD-10-CM | POA: Diagnosis not present

## 2011-08-25 DIAGNOSIS — M719 Bursopathy, unspecified: Secondary | ICD-10-CM | POA: Diagnosis not present

## 2011-08-25 DIAGNOSIS — M67919 Unspecified disorder of synovium and tendon, unspecified shoulder: Secondary | ICD-10-CM | POA: Diagnosis not present

## 2011-08-25 DIAGNOSIS — M19019 Primary osteoarthritis, unspecified shoulder: Secondary | ICD-10-CM | POA: Diagnosis not present

## 2011-08-25 DIAGNOSIS — M75120 Complete rotator cuff tear or rupture of unspecified shoulder, not specified as traumatic: Secondary | ICD-10-CM | POA: Diagnosis not present

## 2011-09-01 ENCOUNTER — Ambulatory Visit (INDEPENDENT_AMBULATORY_CARE_PROVIDER_SITE_OTHER): Payer: Medicare Other | Admitting: Emergency Medicine

## 2011-09-01 ENCOUNTER — Encounter: Payer: Self-pay | Admitting: Emergency Medicine

## 2011-09-01 DIAGNOSIS — J449 Chronic obstructive pulmonary disease, unspecified: Secondary | ICD-10-CM | POA: Diagnosis not present

## 2011-09-01 DIAGNOSIS — M719 Bursopathy, unspecified: Secondary | ICD-10-CM | POA: Diagnosis not present

## 2011-09-01 DIAGNOSIS — G4733 Obstructive sleep apnea (adult) (pediatric): Secondary | ICD-10-CM

## 2011-09-01 DIAGNOSIS — M19019 Primary osteoarthritis, unspecified shoulder: Secondary | ICD-10-CM | POA: Diagnosis not present

## 2011-09-01 DIAGNOSIS — M75120 Complete rotator cuff tear or rupture of unspecified shoulder, not specified as traumatic: Secondary | ICD-10-CM | POA: Diagnosis not present

## 2011-09-01 MED ORDER — DOXYCYCLINE HYCLATE 50 MG PO CAPS: 100.0000 mg | ORAL_CAPSULE | Freq: Two times a day (BID) | ORAL | Status: AC

## 2011-09-01 MED ORDER — PREDNISONE 10 MG PO TABS: ORAL_TABLET | ORAL | Status: DC

## 2011-09-01 NOTE — Assessment & Plan Note (Signed)
He was better, now with another aparent AE from ? URI. Will treat him acutely again, but if these sx persist then we will need to investigate further< ? Whether this is a progressive process - pred taper and doxy - rov 2 -3 weeks with me or TP

## 2011-09-01 NOTE — Assessment & Plan Note (Signed)
Continue CPAP.  

## 2011-09-01 NOTE — Progress Notes (Signed)
  Subjective:    Patient ID: Richard Rosales, male    DOB: 1930/11/13, 78 y.o.   MRN: [id number]  HPI PCP- [facility]  78 yo man, former smoker(quit'80)  For FU of COPD &  OSA on CPAP 12 cm. He has  hx of HTN, A Fib, chronic LE edema, GERD, diverticulosis, remote positive PPD.   TTE with preserved LVEF, PASP 34, not able to comment on diastolic fxn. PFTs 5/11  Showed moderate airway obstruction with FEv1 55% & good BD respnse - improved to 67%  05/06/11  Needs clearance for rotator cuff surgery - Dr Eulah Pont He can walk 100 yards - limited by osteoarthritis in feet. Also has balance issues . Occasional morning wheeze. Rides exercise bike x daily at own pace. Last bronchitis was in 6/12 , required steroids- dexa, steroids make him shake  Not a true allergy (cannot use prednisone)  >>cleared for surgery   07/15/2011 Acute OV  Complains of increased SOB,   fatigue/lethargy x1.5weeks . Some dry cough. No discolored mucus no fever. Increased leg swelling esp in evenings. Does not take lasix on consistent days.  No orthopnea. No wheezing. No chest pain. Weight trending up. Wears CPAP each night.  Previous echo showed preserved LV fxn, mod RA dilatation. Unable to comment on diastolic dysfxn.   Maintains on Symbiocrt  Twice daily   Underwent rotator cuff surgery 05/2011- recovering well with PT weekly.   ROV 08/04/11 -- Hx OSA, COPD on Symbicort. Also A flutter, chronic diastolic CHF.  Saw Dr Johney Frame 2/4 and had good rate control. Was experiecing some "jitteriness" yesterday, no syncope or pre-syncope. He has been taking Symbicort long term, hard to blame it on that. The jitters may have related to exertion. He has about 3-4 days of sore throat and clear mucous.   ROV 09/01/11 -- Hx OSA, COPD on Symbicort. Also A flutter, chronic diastolic CHF. Last time treated for possible AE with pred taper. He had a bit of a break from sx but then developed URI sx again. The prednisone helped  although it made him jittery. He is a bit more SOB over the last several weeks. Hasn't needed SABA. Wears CPAP. Taking prilosec bid.    Objective:   Physical Exam Filed Vitals:   09/01/11 1500  BP: 120/56  Pulse: 58  Temp: 98.2 F (36.8 C)   Gen. Pleasant, morbidly obese in no distress, normal affect ENT - no lesions, no post nasal drip, class 3 airway Neck: No JVD, no thyromegaly, throat wheeze on expiration Lungs: no use of accessory muscles, B expiratory wheezes Cardiovascular: Rhythm regular, heart sounds  normal, no murmurs or gallops, 1-2+  peripheral edema Musculoskeletal: No deformities, no cyanosis or clubbing Neuro:  alert, non focal      Assessment & Plan:  SLEEP APNEA, OBSTRUCTIVE Continue CPAP  COPD He was better, now with another aparent AE from ? URI. Will treat him acutely again, but if these sx persist then we will need to investigate further< ? Whether this is a progressive process - pred taper and doxy - rov 2 -3 weeks with me or TP

## 2011-09-01 NOTE — Patient Instructions (Signed)
Please continue your inhaled medications.  Take prednisone and doxycycline as directed.  Follow with Dr Delton Coombes or Tammy Parrett in 2 -3 weeks to insure that you have improved.

## 2011-09-02 DIAGNOSIS — I10 Essential (primary) hypertension: Secondary | ICD-10-CM | POA: Diagnosis not present

## 2011-09-02 DIAGNOSIS — D649 Anemia, unspecified: Secondary | ICD-10-CM | POA: Diagnosis not present

## 2011-09-02 DIAGNOSIS — E559 Vitamin D deficiency, unspecified: Secondary | ICD-10-CM | POA: Diagnosis not present

## 2011-09-02 DIAGNOSIS — J209 Acute bronchitis, unspecified: Secondary | ICD-10-CM | POA: Diagnosis not present

## 2011-09-02 DIAGNOSIS — R7309 Other abnormal glucose: Secondary | ICD-10-CM | POA: Diagnosis not present

## 2011-09-02 DIAGNOSIS — Z79899 Other long term (current) drug therapy: Secondary | ICD-10-CM | POA: Diagnosis not present

## 2011-09-02 DIAGNOSIS — E669 Obesity, unspecified: Secondary | ICD-10-CM | POA: Diagnosis not present

## 2011-09-02 DIAGNOSIS — E782 Mixed hyperlipidemia: Secondary | ICD-10-CM | POA: Diagnosis not present

## 2011-09-02 DIAGNOSIS — J0410 Acute tracheitis without obstruction: Secondary | ICD-10-CM | POA: Diagnosis not present

## 2011-09-02 DIAGNOSIS — J45909 Unspecified asthma, uncomplicated: Secondary | ICD-10-CM | POA: Diagnosis not present

## 2011-09-08 DIAGNOSIS — M75120 Complete rotator cuff tear or rupture of unspecified shoulder, not specified as traumatic: Secondary | ICD-10-CM | POA: Diagnosis not present

## 2011-09-08 DIAGNOSIS — M19019 Primary osteoarthritis, unspecified shoulder: Secondary | ICD-10-CM | POA: Diagnosis not present

## 2011-09-08 DIAGNOSIS — M719 Bursopathy, unspecified: Secondary | ICD-10-CM | POA: Diagnosis not present

## 2011-09-15 ENCOUNTER — Encounter: Payer: Self-pay | Admitting: Adult Health

## 2011-09-15 ENCOUNTER — Ambulatory Visit (INDEPENDENT_AMBULATORY_CARE_PROVIDER_SITE_OTHER): Payer: Medicare Other | Admitting: Adult Health

## 2011-09-15 VITALS — BP 114/66 | HR 51 | Temp 97.4°F | Ht 69.0 in | Wt 284.4 lb

## 2011-09-15 DIAGNOSIS — J449 Chronic obstructive pulmonary disease, unspecified: Secondary | ICD-10-CM | POA: Diagnosis not present

## 2011-09-15 MED ORDER — TIOTROPIUM BROMIDE MONOHYDRATE 18 MCG IN CAPS: 18.0000 ug | ORAL_CAPSULE | Freq: Every day | RESPIRATORY_TRACT | Status: DC

## 2011-09-15 NOTE — Assessment & Plan Note (Signed)
Recent flare -now resolved . Will add Spiriva to his maintence regimen in hopes to help decrease exacerbation and improved act tolerance.

## 2011-09-15 NOTE — Progress Notes (Signed)
Subjective:    Patient ID: Richard Rosales, male    DOB: 08/09/1930, 78 y.o.   MRN: [id number]  HPI PCP- [facility] - Allred  78 yo man, former smoker(quit'80)  For FU of COPD &  OSA on CPAP 12 cm. He has  hx of HTN, A Fib, chronic LE edema, GERD, diverticulosis, remote positive PPD.   TTE with preserved LVEF, PASP 34, not able to comment on diastolic fxn. PFTs 5/11  Showed moderate airway obstruction with FEv1 55% & good BD respnse - improved to 67%  05/06/11  Needs clearance for rotator cuff surgery - Dr Eulah Pont He can walk 100 yards - limited by osteoarthritis in feet. Also has balance issues . Occasional morning wheeze. Rides exercise bike x daily at own pace. Last bronchitis was in 6/12 , required steroids- dexa, steroids make him shake  Not a true allergy (cannot use prednisone)  >>cleared for surgery   07/15/2011 Acute OV  Complains of increased SOB,   fatigue/lethargy x1.5weeks . Some dry cough. No discolored mucus no fever. Increased leg swelling esp in evenings. Does not take lasix on consistent days.  No orthopnea. No wheezing. No chest pain. Weight trending up. Wears CPAP each night.  Previous echo showed preserved LV fxn, mod RA dilatation. Unable to comment on diastolic dysfxn.   Maintains on Symbiocrt  Twice daily   Underwent rotator cuff surgery 05/2011- recovering well with PT weekly.   ROV 08/04/11 -- Hx OSA, COPD on Symbicort. Also A flutter, chronic diastolic CHF.  Saw Dr Johney Frame 2/4 and had good rate control. Was experiecing some "jitteriness" yesterday, no syncope or pre-syncope. He has been taking Symbicort long term, hard to blame it on that. The jitters may have related to exertion. He has about 3-4 days of sore throat and clear mucous.   ROV 09/01/11 -- Hx OSA, COPD on Symbicort. Also A flutter, chronic diastolic CHF. Last time treated for possible AE with pred taper. He had a bit of a break from sx but then developed URI sx again. The prednisone helped  although it made him jittery. He is a bit more SOB over the last several weeks. Hasn't needed SABA. Wears CPAP. Taking prilosec bid.   09/15/2011 Follow up  Patient returns for a two-week followup visit. Last visit had a COPD exacerbation treated with antibiotics and steroids. Patient has had 2 COPD flares over last couple months. He feels that he is back to his baseline. He denies any chest pain, orthopnea, PND, increased leg swelling, hemoptysis, or fever. Currently taking Symbicort 2 puffs twice daily.   ROS:  Constitutional:   No  weight loss, night sweats,  Fevers, chills, + fatigue, or  lassitude.  HEENT:   No headaches,  Difficulty swallowing,  Tooth/dental problems, or  Sore throat,                No sneezing, itching, ear ache, nasal congestion, post nasal drip,   CV:  No chest pain,  Orthopnea, PND, swelling in lower extremities, anasarca, dizziness, palpitations, syncope.   GI  No heartburn, indigestion, abdominal pain, nausea, vomiting, diarrhea, change in bowel habits, loss of appetite, bloody stools.   Resp:  No coughing up of blood.     No chest wall deformity  Skin: no rash or lesions.  GU: no dysuria, change in color of urine, no urgency or frequency.  No flank pain, no hematuria   MS:  No joint pain or swelling.  No decreased range  of motion.  No back pain.  Psych:  No change in mood or affect. No depression or anxiety.  No memory loss.       Exam:  GEN: A/Ox3; pleasant , NAD,  obese   HEENT:  [place]/AT,  EACs-clear, TMs-wnl, NOSE-clear, THROAT-clear, no lesions, no postnasal drip or exudate noted.   NECK:  Supple w/ fair ROM; no JVD; normal carotid impulses w/o bruits; no thyromegaly or nodules palpated; no lymphadenopathy.  RESP  Coarse BS  w/o, wheezes/ rales/ or rhonchi.no accessory muscle use, no dullness to percussion  CARD:  RRR, no m/r/g  , no peripheral edema, pulses intact, no cyanosis or clubbing.  GI:   Soft & nt; nml bowel sounds; no organomegaly  or masses detected.  Musco: Warm bil, no deformities or joint swelling noted.   Neuro: alert, no focal deficits noted.    Skin: Warm, no lesions or rashes     Assessment & Plan:

## 2011-09-15 NOTE — Patient Instructions (Signed)
Begin Spiriva 1 puff daily  Continue on Symbicort 2 puffs Twice daily   follow up Dr. Delton Coombes  In 2 months and As needed

## 2011-09-26 ENCOUNTER — Telehealth: Payer: Self-pay | Admitting: Emergency Medicine

## 2011-09-26 MED ORDER — TIOTROPIUM BROMIDE MONOHYDRATE 18 MCG IN CAPS: 18.0000 ug | ORAL_CAPSULE | Freq: Every day | RESPIRATORY_TRACT | Status: DC

## 2011-09-26 NOTE — Telephone Encounter (Signed)
I spoke with pt and he stated he needed rx [facility]. I advised i will send rx and nothing further was needed

## 2011-10-14 DIAGNOSIS — M1710 Unilateral primary osteoarthritis, unspecified knee: Secondary | ICD-10-CM | POA: Diagnosis not present

## 2011-11-10 ENCOUNTER — Other Ambulatory Visit: Payer: Self-pay | Admitting: Internal Medicine

## 2011-11-10 MED ORDER — DILTIAZEM HCL ER 240 MG PO CP24: 240.0000 mg | ORAL_CAPSULE | Freq: Two times a day (BID) | ORAL | Status: DC

## 2011-11-13 ENCOUNTER — Encounter: Payer: Self-pay | Admitting: Emergency Medicine

## 2011-11-13 ENCOUNTER — Ambulatory Visit (INDEPENDENT_AMBULATORY_CARE_PROVIDER_SITE_OTHER): Payer: Medicare Other | Admitting: Emergency Medicine

## 2011-11-13 VITALS — BP 128/70 | HR 84 | Temp 97.8°F | Ht 70.0 in | Wt 295.6 lb

## 2011-11-13 DIAGNOSIS — J309 Allergic rhinitis, unspecified: Secondary | ICD-10-CM

## 2011-11-13 DIAGNOSIS — J449 Chronic obstructive pulmonary disease, unspecified: Secondary | ICD-10-CM

## 2011-11-13 NOTE — Patient Instructions (Signed)
Please continue both Spiriva and Symbicort as you are taking them Continue your loratadine, fluticasone nasal spray Start doing nasal saline washes every day again Try using decongestants that contain either chlorpheniramine or brompheniramine.  Follow with Dr Delton Coombes in 3 months

## 2011-11-13 NOTE — Progress Notes (Signed)
Subjective:    Patient ID: Richard Rosales, male    DOB: 03-28-1931, 78 y.o.   MRN: [id number]  HPI PCP- [facility] - Allred  78 yo man, former smoker(quit'80)  For FU of COPD &  OSA on CPAP 12 cm. He has  hx of HTN, A Fib, chronic LE edema, GERD, diverticulosis, remote positive PPD.   TTE with preserved LVEF, PASP 34, not able to comment on diastolic fxn. PFTs 5/11  Showed moderate airway obstruction with FEv1 55% & good BD respnse - improved to 67%  05/06/11  Needs clearance for rotator cuff surgery - Dr Eulah Pont He can walk 100 yards - limited by osteoarthritis in feet. Also has balance issues . Occasional morning wheeze. Rides exercise bike x daily at own pace. Last bronchitis was in 6/12 , required steroids- dexa, steroids make him shake  Not a true allergy (cannot use prednisone)  >>cleared for surgery   07/15/2011 Acute OV  Complains of increased SOB,   fatigue/lethargy x1.5weeks . Some dry cough. No discolored mucus no fever. Increased leg swelling esp in evenings. Does not take lasix on consistent days.  No orthopnea. No wheezing. No chest pain. Weight trending up. Wears CPAP each night.  Previous echo showed preserved LV fxn, mod RA dilatation. Unable to comment on diastolic dysfxn.   Maintains on Symbiocrt  Twice daily   Underwent rotator cuff surgery 05/2011- recovering well with PT weekly.   ROV 08/04/11 -- Hx OSA, COPD on Symbicort. Also A flutter, chronic diastolic CHF.  Saw Dr Johney Frame 2/4 and had good rate control. Was experiecing some "jitteriness" yesterday, no syncope or pre-syncope. He has been taking Symbicort long term, hard to blame it on that. The jitters may have related to exertion. He has about 3-4 days of sore throat and clear mucous.   ROV 09/01/11 -- Hx OSA, COPD on Symbicort. Also A flutter, chronic diastolic CHF. Last time treated for possible AE with pred taper. He had a bit of a break from sx but then developed URI sx again. The prednisone helped  although it made him jittery. He is a bit more SOB over the last several weeks. Hasn't needed SABA. Wears CPAP. Taking prilosec bid.   09/15/2011 Follow up  Patient returns for a two-week followup visit. Last visit had a COPD exacerbation treated with antibiotics and steroids. Patient has had 2 COPD flares over last couple months. He feels that he is back to his baseline. He denies any chest pain, orthopnea, PND, increased leg swelling, hemoptysis, or fever. Currently taking Symbicort 2 puffs twice daily.  ROV 11/13/11 -- Hx OSA, COPD on Symbicort. Also A flutter, chronic diastolic CHF. He saw TP in March and Spiriva was added to Symbicort, he believes it has helped. He has had coughing for the last 3 weeks, one day had nausea. He has had a lot of mucous and drainage. Still using fluticasone nasal spray, loratadine. Not using NSW right now.   ROS:  Constitutional:   No  weight loss, night sweats,  Fevers, chills, + fatigue, or  lassitude.  HEENT:   No headaches,  Difficulty swallowing,  Tooth/dental problems, or  Sore throat,                No sneezing, itching, ear ache, nasal congestion, post nasal drip,   CV:  No chest pain,  Orthopnea, PND, swelling in lower extremities, anasarca, dizziness, palpitations, syncope.   GI  No heartburn, indigestion, abdominal pain, nausea, vomiting, diarrhea,  change in bowel habits, loss of appetite, bloody stools.   Resp:  No coughing up of blood.     No chest wall deformity  Skin: no rash or lesions.  GU: no dysuria, change in color of urine, no urgency or frequency.  No flank pain, no hematuria   MS:  No joint pain or swelling.  No decreased range of motion.  No back pain.  Psych:  No change in mood or affect. No depression or anxiety.  No memory loss.    Filed Vitals:   11/13/11 1409  BP: 128/70  Pulse: 84  Temp: 97.8 F (36.6 C)   Exam:  GEN: A/Ox3; pleasant , NAD,  obese   HEENT:  [place]/AT,  EACs-clear, TMs-wnl, NOSE-clear, THROAT-clear, no  lesions, no postnasal drip or exudate noted.   NECK:  Supple w/ fair ROM; no JVD; normal carotid impulses w/o bruits; no thyromegaly or nodules palpated; no lymphadenopathy.  RESP  Coarse BS  w/o, wheezes/ rales/ or rhonchi.no accessory muscle use, no dullness to percussion  CARD:  RRR, no m/r/g  , no peripheral edema, pulses intact, no cyanosis or clubbing.  GI:   Soft & nt; nml bowel sounds; no organomegaly or masses detected.  Musco: Warm bil, no deformities or joint swelling noted.   Neuro: alert, no focal deficits noted.    Skin: Warm, no lesions or rashes     Assessment & Plan:   COPD - spiriva + symbicort  ALLERGIC RHINITIS Loratadine + fluticasone Add nsw and chlorpheniramine or brompheniramine

## 2011-11-13 NOTE — Assessment & Plan Note (Signed)
Loratadine + fluticasone Add nsw and chlorpheniramine or brompheniramine

## 2011-11-13 NOTE — Assessment & Plan Note (Signed)
-   spiriva + symbicort 

## 2011-11-18 DIAGNOSIS — M79609 Pain in unspecified limb: Secondary | ICD-10-CM | POA: Diagnosis not present

## 2011-11-18 DIAGNOSIS — B351 Tinea unguium: Secondary | ICD-10-CM | POA: Diagnosis not present

## 2011-12-04 ENCOUNTER — Encounter: Payer: Self-pay | Admitting: Internal Medicine

## 2011-12-04 DIAGNOSIS — E559 Vitamin D deficiency, unspecified: Secondary | ICD-10-CM | POA: Diagnosis not present

## 2011-12-04 DIAGNOSIS — E782 Mixed hyperlipidemia: Secondary | ICD-10-CM | POA: Diagnosis not present

## 2011-12-04 DIAGNOSIS — I10 Essential (primary) hypertension: Secondary | ICD-10-CM | POA: Diagnosis not present

## 2011-12-04 DIAGNOSIS — R7309 Other abnormal glucose: Secondary | ICD-10-CM | POA: Diagnosis not present

## 2011-12-04 DIAGNOSIS — Z79899 Other long term (current) drug therapy: Secondary | ICD-10-CM | POA: Diagnosis not present

## 2011-12-09 ENCOUNTER — Telehealth: Payer: Self-pay | Admitting: Internal Medicine

## 2011-12-09 NOTE — Telephone Encounter (Signed)
Forward to Dr. Hillis Range for review 12-09-11 ym

## 2012-02-12 ENCOUNTER — Ambulatory Visit (INDEPENDENT_AMBULATORY_CARE_PROVIDER_SITE_OTHER): Payer: Medicare Other | Admitting: Internal Medicine

## 2012-02-12 ENCOUNTER — Encounter: Payer: Self-pay | Admitting: Internal Medicine

## 2012-02-12 VITALS — BP 140/62 | HR 82 | Resp 18 | Ht 70.0 in | Wt 286.1 lb

## 2012-02-12 DIAGNOSIS — I5032 Chronic diastolic (congestive) heart failure: Secondary | ICD-10-CM | POA: Diagnosis not present

## 2012-02-12 DIAGNOSIS — I4891 Unspecified atrial fibrillation: Secondary | ICD-10-CM

## 2012-02-12 DIAGNOSIS — I4892 Unspecified atrial flutter: Secondary | ICD-10-CM | POA: Diagnosis not present

## 2012-02-12 NOTE — Assessment & Plan Note (Signed)
Stable weight His edema is worsened by venous insufficiency I have instructed him to wear support hose  2 gram sodium restriction We will obtain an echo upon return to see Norma Fredrickson in 6 months

## 2012-02-12 NOTE — Patient Instructions (Addendum)
Your physician wants you to follow-up in: 6 months in the device clinic and 12 months with Dr Allred You will receive a reminder letter in the mail two months in advance. If you don't receive a letter, please call our office to schedule the follow-up appointment.  

## 2012-02-12 NOTE — Assessment & Plan Note (Signed)
Stable He continues to decline anticoagulation

## 2012-02-12 NOTE — Progress Notes (Signed)
PCP: Nadean Corwin, MD  The patient presents today for routine cardiology followup.  Since last being seen in our clinic, the patient reports doing reasonably well. His SOB is improved.  His primary concern is with chronic leg swelling and feet pain.   Today, he denies symptoms of palpitations, chest pain, shortness of breath (above baseline),  dizziness, presyncope, syncope, or neurologic sequela.  He has stable edema.  The patient feels that he is tolerating medications without difficulties and is otherwise without complaint today.   Past Medical History  Diagnosis Date  . HTN (hypertension)   . Diastolic dysfunction   . Atrial fibrillation   . GERD (gastroesophageal reflux disease)   . Allergic rhinitis   . Hypothyroidism   . DJD (degenerative joint disease)   . Diverticulosis of colon     recurrent GI bleeding  . OSA (obstructive sleep apnea)     compliants w/ CPAP  . Hyperplastic colon polyp 2003  . Chronic gastritis   . Positive PPD     remote  . Atrial flutter   . Acute bronchitis   . Anemia     iron deficient  . Shortness of breath   . COPD (chronic obstructive pulmonary disease)   . Venous insufficiency    Past Surgical History  Procedure Date  . Cardiac catheterization 7.27.05    revealed a preserved EF with no CAD  . Tonsillectomy   . Total knee arthroplasty     bilateral  . Nasal sinus surgery   . Colonoscopy   . Joint replacement 2001    lt total knee-rt one done 00  . Hernia repair     bilateral  . Hernia repair   . Transurethral resection of prostate     Current Outpatient Prescriptions  Medication Sig Dispense Refill  . acetaminophen-codeine (TYLENOL #3) 300-30 MG per tablet Take 1 tablet by mouth every 4 (four) hours as needed.      Marland Kitchen aspirin 81 MG tablet Take by mouth daily.       Marland Kitchen b complex vitamins tablet Take 2 tablets by mouth every morning.       . budesonide-formoterol (SYMBICORT) 160-4.5 MCG/ACT inhaler Inhale 2 puffs into the lungs  2 (two) times daily.  3 Inhaler  3  . Cholecalciferol (VITAMIN D) 2000 UNITS CAPS Take 2 capsules by mouth daily.       Marland Kitchen diltiazem (DILT-XR) 240 MG 24 hr capsule Take 1 capsule (240 mg total) by mouth 2 (two) times daily.  180 capsule  2  . DIOVAN 320 MG tablet Take 320 mg by mouth At bedtime.      . docusate sodium (COLACE) 100 MG capsule Take 100 mg by mouth 2 (two) times daily.        . fish oil-omega-3 fatty acids 1000 MG capsule Take 1 g by mouth 2 (two) times daily.        . furosemide (LASIX) 40 MG tablet Take 40 mg by mouth daily.       . Glucosamine HCl 1000 MG TABS Take 1 tablet by mouth daily.        Marland Kitchen guaifenesin (HUMIBID E) 400 MG TABS Take 400 mg by mouth 2 (two) times daily.        Marland Kitchen LEVOXYL 100 MCG tablet Take 100 mcg by mouth Every morning.      . minoxidil (LONITEN) 10 MG tablet Take 2.5 mg by mouth daily. 1/2 tab by mouth at bedtime      .  omeprazole (PRILOSEC) 20 MG capsule Take 20 mg by mouth Twice daily before meals.      . Psyllium (METAMUCIL) 28.3 % POWD 1 scoop twice daily       . tiotropium (SPIRIVA HANDIHALER) 18 MCG inhalation capsule Place 1 capsule (18 mcg total) into inhaler and inhale daily.  90 capsule  1  . fluticasone (FLONASE) 50 MCG/ACT nasal spray USE 2 SPRAYS IN EACH NOSTRIL TWICE A DAY  3 g  0  . PROVIGIL 200 MG tablet Take 100 mg by mouth daily as needed. 1/2 tab by mouth on Wed and Sun when pt drives        Allergies  Allergen Reactions  . Fluticasone-Salmeterol     Unable to remember per pt  . Prednisone     Shakes / tremors    History   Social History  . Marital Status: Married    Spouse Name: N/A    Number of Children: 4  . Years of Education: N/A   Occupational History  . part-time as a Education officer, environmental at [employer]    Social History Main Topics  . Smoking status: Former Smoker -- 1.0 packs/day for 20 years    Types: Cigarettes    Quit date: 08/01/1978  . Smokeless tobacco: Never Used  . Alcohol Use: Yes     a glass of wine  a day  . Drug Use: No  . Sexually Active: Not on file   Other Topics Concern  . 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    Family History  Problem Relation Age of Onset  . Hypertension Mother   . Diabetes Father   . Lung cancer Brother     chemical     Physical Exam: Filed Vitals:   02/12/12 1222  BP: 140/62  Pulse: 82  Resp: 18  Height: 5\' 10"  (1.778 m)  Weight: 286 lb 1.9 oz (129.783 kg)  SpO2: 95%    GEN- The patient is overweight appearing, alert and oriented x 3 today.   Head- normocephalic, atraumatic Eyes-  Sclera clear, conjunctiva pink Ears- hearing intact Oropharynx- clear Neck- supple, JVP 9cm Lymph- no cervical lymphadenopathy Lungs- Clear to ausculation bilaterally, normal work of breathing, few scattered wheezes Heart- irregular rate and rhythm, no murmurs, rubs or gallops, PMI not laterally displaced GI- soft, NT, ND, + BS Extremities- no clubbing, cyanosis, 1+ edema MS- walks slowly with a cane Skin- no rash or lesion Psych- euthymic mood, full affect Neuro- strength and sensation are intact  ekg today reveals atrial flutter, IVCD, LAD  Assessment and Plan:

## 2012-02-16 ENCOUNTER — Encounter: Payer: Self-pay | Admitting: Emergency Medicine

## 2012-02-16 ENCOUNTER — Ambulatory Visit (INDEPENDENT_AMBULATORY_CARE_PROVIDER_SITE_OTHER): Payer: Medicare Other | Admitting: Emergency Medicine

## 2012-02-16 VITALS — BP 152/72 | HR 86 | Temp 98.2°F | Ht 67.0 in | Wt 289.0 lb

## 2012-02-16 DIAGNOSIS — G4733 Obstructive sleep apnea (adult) (pediatric): Secondary | ICD-10-CM

## 2012-02-16 DIAGNOSIS — J309 Allergic rhinitis, unspecified: Secondary | ICD-10-CM

## 2012-02-16 DIAGNOSIS — J449 Chronic obstructive pulmonary disease, unspecified: Secondary | ICD-10-CM

## 2012-02-16 DIAGNOSIS — J4489 Other specified chronic obstructive pulmonary disease: Secondary | ICD-10-CM

## 2012-02-16 NOTE — Patient Instructions (Addendum)
Continue your Spiriva and Symbicort as you are taking them  Continue your CPAP every night Call our office if your breathing worsens in any way We will refer you to Dr Barnetta Chapel with [facility] Follow with Dr Delton Coombes in 1 month

## 2012-02-16 NOTE — Progress Notes (Signed)
Subjective:    Patient ID: Richard Rosales, male    DOB: 07-12-1930, 78 y.o.   MRN: [id number]  HPI PCP- [facility] - Allred  78 yo man, former smoker(quit'80)  For FU of COPD &  OSA on CPAP 12 cm. He has  hx of HTN, A Fib, chronic LE edema, GERD, diverticulosis, remote positive PPD.   TTE with preserved LVEF, PASP 34, not able to comment on diastolic fxn. PFTs 5/11  Showed moderate airway obstruction with FEv1 55% & good BD respnse - improved to 67%  05/06/11  Needs clearance for rotator cuff surgery - Dr Eulah Pont He can walk 100 yards - limited by osteoarthritis in feet. Also has balance issues . Occasional morning wheeze. Rides exercise bike x daily at own pace. Last bronchitis was in 6/12 , required steroids- dexa, steroids make him shake  Not a true allergy (cannot use prednisone)  >>cleared for surgery   07/15/2011 Acute OV  Complains of increased SOB,   fatigue/lethargy x1.5weeks . Some dry cough. No discolored mucus no fever. Increased leg swelling esp in evenings. Does not take lasix on consistent days.  No orthopnea. No wheezing. No chest pain. Weight trending up. Wears CPAP each night.  Previous echo showed preserved LV fxn, mod RA dilatation. Unable to comment on diastolic dysfxn.   Maintains on Symbiocrt  Twice daily   Underwent rotator cuff surgery 05/2011- recovering well with PT weekly.   ROV 08/04/11 -- Hx OSA, COPD on Symbicort. Also A flutter, chronic diastolic CHF.  Saw Dr Johney Frame 2/4 and had good rate control. Was experiecing some "jitteriness" yesterday, no syncope or pre-syncope. He has been taking Symbicort long term, hard to blame it on that. The jitters may have related to exertion. He has about 3-4 days of sore throat and clear mucous.   ROV 09/01/11 -- Hx OSA, COPD on Symbicort. Also A flutter, chronic diastolic CHF. Last time treated for possible AE with pred taper. He had a bit of a break from sx but then developed URI sx again. The prednisone helped  although it made him jittery. He is a bit more SOB over the last several weeks. Hasn't needed SABA. Wears CPAP. Taking prilosec bid.   09/15/2011 Follow up  Patient returns for a two-week followup visit. Last visit had a COPD exacerbation treated with antibiotics and steroids. Patient has had 2 COPD flares over last couple months. He feels that he is back to his baseline. He denies any chest pain, orthopnea, PND, increased leg swelling, hemoptysis, or fever. Currently taking Symbicort 2 puffs twice daily.  ROV 11/13/11 -- Hx OSA, COPD on Symbicort. Also A flutter, chronic diastolic CHF. He saw TP in March and Spiriva was added to Symbicort, he believes it has helped. He has had coughing for the last 3 weeks, one day had nausea. He has had a lot of mucous and drainage. Still using fluticasone nasal spray, loratadine. Not using [place] right now.   ROV 02/16/12 -- Hx OSA, COPD on Symbicort. Also A flutter, chronic diastolic CHF. He is off the loratadine and fluticasone, he is using chlorpheniramine q6h. He continues to have paroxysms. He has been on allergy shots in the past, not recently. He is interested in going to Dr Barnetta Chapel. He tells me that breathing is overall the same. He is still limited in his exertion. Still on Symbicort and spiriva, no pred or abx since last time.  Reliable with CPAP.   Filed Vitals:   02/16/12 1346  BP: 152/72  Pulse: 86  Temp: 98.2 F (36.8 C)   Exam:  GEN: A/Ox3; pleasant , NAD,  obese   HEENT:  [place]/AT,  EACs-clear, TMs-wnl, NOSE-clear, THROAT-clear, no lesions, no postnasal drip or exudate noted.   NECK:  Supple w/ fair ROM; no JVD; normal carotid impulses w/o bruits; no thyromegaly or nodules palpated; no lymphadenopathy.  RESP  Coarse BS with B exp wheezes, louder than prior exams.   CARD:  RRR, no m/r/g  , no peripheral edema, pulses intact, no cyanosis or clubbing.  GI:   Soft & nt; nml bowel sounds; no organomegaly or masses detected.  Musco: Warm bil, no  deformities or joint swelling noted.   Neuro: alert, no focal deficits noted.    Skin: Warm, no lesions or rashes     Assessment & Plan:   ALLERGIC RHINITIS Currently off loratadine and nasal steroid Wants to go see Dr Barnetta Chapel  COPD More wheeze today than his baseline, although he feels clinically stable.  - defer steroids for now, but low threshold to start if he changes.  - f/u 1 month to assess for interval improvement. If the same then will give pred taper.    SLEEP APNEA, OBSTRUCTIVE Continue same CPAP

## 2012-02-16 NOTE — Assessment & Plan Note (Signed)
Currently off loratadine and nasal steroid Wants to go see Dr Barnetta Chapel

## 2012-02-16 NOTE — Assessment & Plan Note (Signed)
More wheeze today than his baseline, although he feels clinically stable.  - defer steroids for now, but low threshold to start if he changes.  - f/u 1 month to assess for interval improvement. If the same then will give pred taper.

## 2012-02-16 NOTE — Assessment & Plan Note (Signed)
Continue same CPAP  

## 2012-02-19 DIAGNOSIS — J449 Chronic obstructive pulmonary disease, unspecified: Secondary | ICD-10-CM | POA: Diagnosis not present

## 2012-02-19 DIAGNOSIS — J3089 Other allergic rhinitis: Secondary | ICD-10-CM | POA: Diagnosis not present

## 2012-02-20 DIAGNOSIS — M79609 Pain in unspecified limb: Secondary | ICD-10-CM | POA: Diagnosis not present

## 2012-02-20 DIAGNOSIS — B351 Tinea unguium: Secondary | ICD-10-CM | POA: Diagnosis not present

## 2012-03-04 ENCOUNTER — Encounter: Payer: Self-pay | Admitting: Internal Medicine

## 2012-03-04 DIAGNOSIS — E559 Vitamin D deficiency, unspecified: Secondary | ICD-10-CM | POA: Diagnosis not present

## 2012-03-04 DIAGNOSIS — I10 Essential (primary) hypertension: Secondary | ICD-10-CM | POA: Diagnosis not present

## 2012-03-04 DIAGNOSIS — R0602 Shortness of breath: Secondary | ICD-10-CM | POA: Diagnosis not present

## 2012-03-04 DIAGNOSIS — R7309 Other abnormal glucose: Secondary | ICD-10-CM | POA: Diagnosis not present

## 2012-03-04 DIAGNOSIS — E782 Mixed hyperlipidemia: Secondary | ICD-10-CM | POA: Diagnosis not present

## 2012-03-04 DIAGNOSIS — M129 Arthropathy, unspecified: Secondary | ICD-10-CM | POA: Diagnosis not present

## 2012-03-22 ENCOUNTER — Ambulatory Visit (INDEPENDENT_AMBULATORY_CARE_PROVIDER_SITE_OTHER): Payer: Medicare Other | Admitting: Emergency Medicine

## 2012-03-22 ENCOUNTER — Encounter: Payer: Self-pay | Admitting: Emergency Medicine

## 2012-03-22 VITALS — BP 140/80 | HR 56 | Ht 69.0 in | Wt 288.2 lb

## 2012-03-22 DIAGNOSIS — J449 Chronic obstructive pulmonary disease, unspecified: Secondary | ICD-10-CM | POA: Diagnosis not present

## 2012-03-22 NOTE — Progress Notes (Signed)
Subjective:    Patient ID: Richard Rosales, male    DOB: 04-29-1931, 78 y.o.   MRN: [id number]  HPI PCP- [facility]  78 yo man, former smoker(quit'80)  For FU of COPD &  OSA on CPAP 12 cm. He has  hx of HTN, A Fib, chronic LE edema, GERD, diverticulosis, remote positive PPD.   TTE with preserved LVEF, PASP 34, not able to comment on diastolic fxn. PFTs 5/11  Showed moderate airway obstruction with FEv1 55% & good BD respnse - improved to 67%  05/06/11  Needs clearance for rotator cuff surgery - Dr Eulah Pont He can walk 100 yards - limited by osteoarthritis in feet. Also has balance issues . Occasional morning wheeze. Rides exercise bike x daily at own pace. Last bronchitis was in 6/12 , required steroids- dexa, steroids make him shake  Not a true allergy (cannot use prednisone)  >>cleared for surgery   07/15/2011 Acute OV  Complains of increased SOB,   fatigue/lethargy x1.5weeks . Some dry cough. No discolored mucus no fever. Increased leg swelling esp in evenings. Does not take lasix on consistent days.  No orthopnea. No wheezing. No chest pain. Weight trending up. Wears CPAP each night.  Previous echo showed preserved LV fxn, mod RA dilatation. Unable to comment on diastolic dysfxn.   Maintains on Symbiocrt  Twice daily   Underwent rotator cuff surgery 05/2011- recovering well with PT weekly.   ROV 08/04/11 -- Hx OSA, COPD on Symbicort. Also A flutter, chronic diastolic CHF.  Saw Dr Johney Frame 2/4 and had good rate control. Was experiecing some "jitteriness" yesterday, no syncope or pre-syncope. He has been taking Symbicort long term, hard to blame it on that. The jitters may have related to exertion. He has about 3-4 days of sore throat and clear mucous.   ROV 09/01/11 -- Hx OSA, COPD on Symbicort. Also A flutter, chronic diastolic CHF. Last time treated for possible AE with pred taper. He had a bit of a break from sx but then developed URI sx again. The prednisone helped  although it made him jittery. He is a bit more SOB over the last several weeks. Hasn't needed SABA. Wears CPAP. Taking prilosec bid.   09/15/2011 Follow up  Patient returns for a two-week followup visit. Last visit had a COPD exacerbation treated with antibiotics and steroids. Patient has had 2 COPD flares over last couple months. He feels that he is back to his baseline. He denies any chest pain, orthopnea, PND, increased leg swelling, hemoptysis, or fever. Currently taking Symbicort 2 puffs twice daily.  ROV 11/13/11 -- Hx OSA, COPD on Symbicort. Also A flutter, chronic diastolic CHF. He saw TP in March and Spiriva was added to Symbicort, he believes it has helped. He has had coughing for the last 3 weeks, one day had nausea. He has had a lot of mucous and drainage. Still using fluticasone nasal spray, loratadine. Not using NSW right now.   ROV 02/16/12 -- Hx OSA, COPD on Symbicort. Also A flutter, chronic diastolic CHF.  He is off the loratadine and fluticasone, he is using chlorpheniramine q6h. He continues to have paroxysms. He has been on allergy shots in the past, not recently. He is interested in going to Dr Barnetta Chapel. He tells me that breathing is overall the same. He is still limited in his exertion. Still on Symbicort and spiriva, no pred or abx since last time.  Reliable with CPAP.   ROV 03/22/12 -- Hx OSA,  COPD on Symbicort. Also A flutter, chronic diastolic CHF.  He is having more mucous and cough. Reassuring eval by Dr Barnetta Chapel, better drainage with astelin/fluticasone. He is wondering if Symbicort is bothering his cough. On Spiriva.   Filed Vitals:   03/22/12 1047  BP: 140/80  Pulse: 56   Exam:  GEN: A/Ox3; pleasant , NAD,  obese   HEENT:  [place]/AT,  EACs-clear, TMs-wnl, NOSE-clear, THROAT-clear, no lesions, no postnasal drip or exudate noted.   NECK:  Supple w/ fair ROM; no JVD; normal carotid impulses w/o bruits; no thyromegaly or nodules palpated; no lymphadenopathy.  RESP  Coarse BS  without wheeze (improved)  CARD:  RRR, no m/r/g  , no peripheral edema, pulses intact, no cyanosis or clubbing.  GI:   Soft & nt; nml bowel sounds; no organomegaly or masses detected.  Musco: Warm bil, no deformities or joint swelling noted.   Neuro: alert, no focal deficits noted.    Skin: Warm, no lesions or rashes     Assessment & Plan:   COPD Wheezing is better today.  - temporarily stop symbicort to see if cough and throat better.  - continue spiriva - rov 4 months.

## 2012-03-22 NOTE — Patient Instructions (Addendum)
Try stopping Symbicort for 10 days to see if your cough and drainage improve Continue your Spiriva Continue your allergy medications as directed by Dr Barnetta Chapel Follow with Dr Delton Coombes in 4 months or sooner if you have any problems.

## 2012-03-22 NOTE — Assessment & Plan Note (Signed)
Wheezing is better today.  - temporarily stop symbicort to see if cough and throat better.  - continue spiriva - rov 4 months.

## 2012-03-27 ENCOUNTER — Other Ambulatory Visit: Payer: Self-pay | Admitting: Emergency Medicine

## 2012-04-05 DIAGNOSIS — Z23 Encounter for immunization: Secondary | ICD-10-CM | POA: Diagnosis not present

## 2012-04-05 DIAGNOSIS — I4891 Unspecified atrial fibrillation: Secondary | ICD-10-CM | POA: Diagnosis not present

## 2012-04-05 DIAGNOSIS — R7309 Other abnormal glucose: Secondary | ICD-10-CM | POA: Diagnosis not present

## 2012-04-05 DIAGNOSIS — Z1212 Encounter for screening for malignant neoplasm of rectum: Secondary | ICD-10-CM | POA: Diagnosis not present

## 2012-04-05 DIAGNOSIS — E782 Mixed hyperlipidemia: Secondary | ICD-10-CM | POA: Diagnosis not present

## 2012-04-05 DIAGNOSIS — I10 Essential (primary) hypertension: Secondary | ICD-10-CM | POA: Diagnosis not present

## 2012-04-05 DIAGNOSIS — E559 Vitamin D deficiency, unspecified: Secondary | ICD-10-CM | POA: Diagnosis not present

## 2012-06-03 DIAGNOSIS — D649 Anemia, unspecified: Secondary | ICD-10-CM | POA: Diagnosis not present

## 2012-06-03 DIAGNOSIS — E559 Vitamin D deficiency, unspecified: Secondary | ICD-10-CM | POA: Diagnosis not present

## 2012-06-03 DIAGNOSIS — R7309 Other abnormal glucose: Secondary | ICD-10-CM | POA: Diagnosis not present

## 2012-06-03 DIAGNOSIS — E782 Mixed hyperlipidemia: Secondary | ICD-10-CM | POA: Diagnosis not present

## 2012-06-03 DIAGNOSIS — J209 Acute bronchitis, unspecified: Secondary | ICD-10-CM | POA: Diagnosis not present

## 2012-06-03 DIAGNOSIS — E669 Obesity, unspecified: Secondary | ICD-10-CM | POA: Diagnosis not present

## 2012-06-03 DIAGNOSIS — Z79899 Other long term (current) drug therapy: Secondary | ICD-10-CM | POA: Diagnosis not present

## 2012-06-03 DIAGNOSIS — J0410 Acute tracheitis without obstruction: Secondary | ICD-10-CM | POA: Diagnosis not present

## 2012-06-03 DIAGNOSIS — I10 Essential (primary) hypertension: Secondary | ICD-10-CM | POA: Diagnosis not present

## 2012-06-03 DIAGNOSIS — Z125 Encounter for screening for malignant neoplasm of prostate: Secondary | ICD-10-CM | POA: Diagnosis not present

## 2012-06-03 DIAGNOSIS — Z1212 Encounter for screening for malignant neoplasm of rectum: Secondary | ICD-10-CM | POA: Diagnosis not present

## 2012-06-03 DIAGNOSIS — J45909 Unspecified asthma, uncomplicated: Secondary | ICD-10-CM | POA: Diagnosis not present

## 2012-06-03 DIAGNOSIS — I4891 Unspecified atrial fibrillation: Secondary | ICD-10-CM | POA: Diagnosis not present

## 2012-06-03 DIAGNOSIS — Z23 Encounter for immunization: Secondary | ICD-10-CM | POA: Diagnosis not present

## 2012-06-17 ENCOUNTER — Other Ambulatory Visit: Payer: Self-pay | Admitting: Emergency Medicine

## 2012-06-28 ENCOUNTER — Telehealth: Payer: Self-pay | Admitting: Emergency Medicine

## 2012-06-28 MED ORDER — BUDESONIDE-FORMOTEROL FUMARATE 160-4.5 MCG/ACT IN AERO: 2.0000 | INHALATION_SPRAY | Freq: Two times a day (BID) | RESPIRATORY_TRACT | Status: DC

## 2012-06-28 NOTE — Telephone Encounter (Signed)
Pt is aware that an additional 60 day supply has been sent in for him.

## 2012-07-06 DIAGNOSIS — B351 Tinea unguium: Secondary | ICD-10-CM | POA: Diagnosis not present

## 2012-07-06 DIAGNOSIS — M79609 Pain in unspecified limb: Secondary | ICD-10-CM | POA: Diagnosis not present

## 2012-07-16 ENCOUNTER — Telehealth: Payer: Self-pay | Admitting: Emergency Medicine

## 2012-07-16 NOTE — Telephone Encounter (Signed)
I spoke with pt and he is requesting sample upfront for pick up. I advised pt will leave sample upfront. Nothing further was needed

## 2012-08-04 ENCOUNTER — Ambulatory Visit: Payer: Medicare Other | Admitting: Emergency Medicine

## 2012-08-09 DIAGNOSIS — J3089 Other allergic rhinitis: Secondary | ICD-10-CM | POA: Diagnosis not present

## 2012-08-09 DIAGNOSIS — J449 Chronic obstructive pulmonary disease, unspecified: Secondary | ICD-10-CM | POA: Diagnosis not present

## 2012-08-10 ENCOUNTER — Other Ambulatory Visit: Payer: Self-pay | Admitting: Emergency Medicine

## 2012-08-10 MED ORDER — DILTIAZEM HCL ER 240 MG PO CP24: 240.0000 mg | ORAL_CAPSULE | Freq: Two times a day (BID) | ORAL | Status: DC

## 2012-08-18 ENCOUNTER — Encounter: Payer: Self-pay | Admitting: Nurse Practitioner

## 2012-08-18 ENCOUNTER — Ambulatory Visit (INDEPENDENT_AMBULATORY_CARE_PROVIDER_SITE_OTHER): Payer: Medicare Other | Admitting: Nurse Practitioner

## 2012-08-18 VITALS — BP 135/72 | HR 55 | Ht 71.0 in | Wt 282.0 lb

## 2012-08-18 DIAGNOSIS — I4892 Unspecified atrial flutter: Secondary | ICD-10-CM

## 2012-08-18 NOTE — Progress Notes (Signed)
Richard Rosales Date of Birth: [birth year] Medical Record #[id number]  History of Present Illness: Richard Rosales is seen back today for a 6 month check. He is seen for Dr. Johney Frame. He is 81. He has chronic atrial flutter. Has declined anticoagulation. Other issues include HTN, diastolic dysfunction, GERD, hypothyroidism, DJD, OSA, iron deficiency anemia, COPD and venous insufficiency. He has chronic swelling of his legs. Last echo was in 2011. He was last seen here back in August. Repeat echo suggested for this visit.   He comes in today. He is here alone. Using a cane. Seems frail. Says he is doing ok. Tries to remain active. Still doing some ministry work. Still able to ride his bicycle a couple of times a week. Very limited by his arthritis. Has had past history of GI bleed. Does not want to be on anticoagulation. No palpitations. No chest pain. Breathing is at his baseline. Tends to wheeze. Seems to be tolerating his medicines. Swelling has improved some. Wearing diabetic socks now. Tries to take his Lasix as prescribed but admits it is hard when he is trying to remain social and has outside obligations.   Current Outpatient Prescriptions on File Prior to Visit  Medication Sig Dispense Refill  . acetaminophen-codeine (TYLENOL #3) 300-30 MG per tablet Take 1 tablet by mouth every 4 (four) hours as needed.      Marland Kitchen aspirin 81 MG tablet Take by mouth daily.       Marland Kitchen b complex vitamins tablet Take 1 tablet by mouth every morning.       . budesonide-formoterol (SYMBICORT) 160-4.5 MCG/ACT inhaler Inhale 2 puffs into the lungs 2 (two) times daily.  2 Inhaler  1  . Chlorpheniramine Maleate (CHLOR-TRIMETON PO) Take 1 tablet by mouth 2 (two) times daily.      . Cholecalciferol (VITAMIN D) 2000 UNITS CAPS Take 2 capsules by mouth daily.       Marland Kitchen diltiazem (DILT-XR) 240 MG 24 hr capsule Take 1 capsule (240 mg total) by mouth 2 (two) times daily.  180 capsule  2  . DIOVAN 320 MG tablet Take 320 mg by mouth At bedtime.       . docusate sodium (COLACE) 100 MG capsule Take 100 mg by mouth 2 (two) times daily.        . fish oil-omega-3 fatty acids 1000 MG capsule Take 1 g by mouth 2 (two) times daily.        . furosemide (LASIX) 40 MG tablet Take 40 mg by mouth daily.       . Glucosamine HCl 1000 MG TABS Take 1 tablet by mouth daily.        Marland Kitchen guaifenesin (HUMIBID E) 400 MG TABS Take 400 mg by mouth 2 (two) times daily.        Marland Kitchen LEVOXYL 100 MCG tablet Take 100 mcg by mouth Every morning.      . minoxidil (LONITEN) 10 MG tablet Take 2.5 mg by mouth daily. 1/2 tab by mouth at bedtime      . omeprazole (PRILOSEC) 20 MG capsule Take 20 mg by mouth Twice daily before meals.      Marland Kitchen PROVIGIL 200 MG tablet Take 100 mg by mouth daily as needed. 1/2 tab by mouth on Wed and Sun when pt drives      . Psyllium (METAMUCIL) 28.3 % POWD 1 scoop twice daily       . SPIRIVA HANDIHALER 18 MCG inhalation capsule INHALE THE CONTENTS OF 1 CAPSULE DAILY AS  DIRECTED  90 capsule  1   No current facility-administered medications on file prior to visit.    Allergies  Allergen Reactions  . Fluticasone-Salmeterol     Unable to remember per pt  . Prednisone     Shakes / tremors    Past Medical History  Diagnosis Date  . HTN (hypertension)   . Diastolic dysfunction   . Atrial fibrillation   . GERD (gastroesophageal reflux disease)   . Allergic rhinitis   . Hypothyroidism   . DJD (degenerative joint disease)   . Diverticulosis of colon     recurrent GI bleeding  . OSA (obstructive sleep apnea)     compliants w/ CPAP  . Hyperplastic colon polyp 2003  . Chronic gastritis   . Positive PPD     remote  . Atrial flutter   . Acute bronchitis   . Anemia     iron deficient  . Shortness of breath   . COPD (chronic obstructive pulmonary disease)   . Venous insufficiency     Past Surgical History  Procedure Laterality Date  . Cardiac catheterization  7.27.05    revealed a preserved EF with no CAD  . Tonsillectomy    . Total  knee arthroplasty      bilateral  . Nasal sinus surgery    . Colonoscopy    . Joint replacement  2001    lt total knee-rt one done 00  . Hernia repair      bilateral  . Hernia repair    . Transurethral resection of prostate      History  Smoking status  . Former Smoker -- 1.00 packs/day for 20 years  . Types: Cigarettes  . Quit date: 08/01/1978  Smokeless tobacco  . Never Used    History  Alcohol Use  . Yes    Comment: a glass of wine a day    Family History  Problem Relation Age of Onset  . Hypertension Mother   . Diabetes Father   . Lung cancer Brother     chemical    Review of Systems: The review of systems is per the HPI.  All other systems were reviewed and are negative.  Physical Exam: BP 135/72  Pulse 55  Ht 5\' 11"  (1.803 m)  Wt 282 lb (127.914 kg)  BMI 39.35 kg/m2 His weight is down 4 pounds since last visit.  Patient is an elderly male who is very pleasant and in no acute distress. Skin is warm and dry. Color is normal.  HEENT is unremarkable. Normocephalic/atraumatic. PERRL. Sclera are nonicteric. Neck is supple. No masses. No JVD. Lungs are clear. Cardiac exam shows a regular rate and rhythm. Abdomen is soft. Extremities are without edema. Gait and ROM are intact. No gross neurologic deficits noted.  LABORATORY DATA: EKG today shows atrial fib today with a rate of 57.     Chemistry      Component Value Date/Time   NA 137 07/15/2011 1634   K 4.4 07/15/2011 1634   CL 102 07/15/2011 1634   CO2 29 07/15/2011 1634   BUN 18 07/15/2011 1634   CREATININE 0.9 07/15/2011 1634      Component Value Date/Time   CALCIUM 8.9 07/15/2011 1634   ALKPHOS 66 11/26/2010 0942   AST 26 11/26/2010 0942   ALT 18 11/26/2010 0942   BILITOT 0.5 11/26/2010 0942     Lab Results  Component Value Date   WBC 9.0 01/15/2011   HGB 15.0 06/05/2011  HCT 44.0 06/05/2011   MCV 81.1 01/15/2011   PLT 300.0 01/15/2011   No results found for this basename: CHOL,  HDL,  LDLCALC,  LDLDIRECT,   TRIG,  CHOLHDL    Assessment / Plan: 1. Atrial fib/flutter - chronic - has declined anticoagulation - risk of stroke is increased due to increased CHADs. He has had past GI bleeding. He remains on aspirin therapy. Rate is a little slower today but he is totally asymptomatic. I have left him on his current regimen.   2. COPD - followed by pulmonary.   3. Diastolic heart failure - needs echo updated per Dr. Jenel Lucks last recommendation.   Patient is agreeable to this plan and will call if any problems develop in the interim.

## 2012-08-18 NOTE — Addendum Note (Signed)
Addended by: Demetrios Loll on: 08/18/2012 03:29 PM   Modules accepted: Orders

## 2012-08-18 NOTE — Patient Instructions (Addendum)
Stay on your current medicines  We need to update your ultrasound of your heart  See Dr. Johney Frame in August  Stay as active as possible.   Call the [facility] office at [phone] if you have any questions, problems or concerns.

## 2012-08-18 NOTE — Addendum Note (Signed)
**Note De-Identified Richard Rosales Obfuscation** Addended by: Demetrios Loll on: 08/18/2012 03:24 PM   Modules accepted: Orders

## 2012-08-23 ENCOUNTER — Telehealth: Payer: Self-pay

## 2012-08-23 NOTE — Telephone Encounter (Signed)
[facility] pt request for ditiazem 240 mg (ER) 1 tab twice a day # 180 with 3 refills DAJ

## 2012-08-25 ENCOUNTER — Ambulatory Visit: Payer: Medicare Other | Admitting: Emergency Medicine

## 2012-08-26 ENCOUNTER — Ambulatory Visit (INDEPENDENT_AMBULATORY_CARE_PROVIDER_SITE_OTHER): Payer: Medicare Other | Admitting: Emergency Medicine

## 2012-08-26 ENCOUNTER — Encounter: Payer: Self-pay | Admitting: Emergency Medicine

## 2012-08-26 VITALS — BP 140/84 | HR 82 | Temp 97.2°F | Ht 68.0 in | Wt 284.0 lb

## 2012-08-26 DIAGNOSIS — G4733 Obstructive sleep apnea (adult) (pediatric): Secondary | ICD-10-CM | POA: Diagnosis not present

## 2012-08-26 DIAGNOSIS — J449 Chronic obstructive pulmonary disease, unspecified: Secondary | ICD-10-CM

## 2012-08-26 DIAGNOSIS — J309 Allergic rhinitis, unspecified: Secondary | ICD-10-CM

## 2012-08-26 NOTE — Progress Notes (Signed)
Subjective:  Patient ID: Richard Rosales, male    DOB: [birth year], 80 y.o.   MRN: [id number] HPI PCP- [facility]  79 yo man, former smoker(quit'80)  For FU of COPD &  OSA on CPAP 12 cm. He has  hx of HTN, A Fib, chronic diastolic dysfxn and LE edema, GERD, diverticulosis, remote positive PPD.   TTE with preserved LVEF, PASP 34, not able to comment on diastolic fxn. PFTs 5/11  Showed moderate airway obstruction with FEv1 55% & good BD respnse - improved to 67%  09/15/2011 Follow up  Patient returns for a two-week followup visit. Last visit had a COPD exacerbation treated with antibiotics and steroids. Patient has had 2 COPD flares over last couple months. He feels that he is back to his baseline. He denies any chest pain, orthopnea, PND, increased leg swelling, hemoptysis, or fever. Currently taking Symbicort 2 puffs twice daily.  ROV 11/13/11 -- Hx OSA, COPD on Symbicort. Also A flutter, chronic diastolic CHF. He saw TP in March and Spiriva was added to Symbicort, he believes it has helped. He has had coughing for the last 3 weeks, one day had nausea. He has had a lot of mucous and drainage. Still using fluticasone nasal spray, loratadine. Not using NSW right now.   ROV 02/16/12 -- Hx OSA, COPD on Symbicort. Also A flutter, chronic diastolic CHF.  He is off the loratadine and fluticasone, he is using chlorpheniramine q6h. He continues to have paroxysms. He has been on allergy shots in the past, not recently. He is interested in going to Dr Barnetta Chapel. He tells me that breathing is overall the same. He is still limited in his exertion. Still on Symbicort and spiriva, no pred or abx since last time.  Reliable with CPAP.   ROV 03/22/12 -- Hx OSA, COPD on Symbicort and Spiriva. Also A flutter, chronic diastolic CHF.  He is having more mucous and cough. Reassuring eval by Dr Barnetta Chapel, better drainage with astelin/fluticasone. He is wondering if Symbicort is bothering his cough.   ROV 08/26/12 -- Hx  OSA, COPD on Symbicort and Spiriva. Also A flutter, chronic diastolic CHF. Chronic rhinitis, has been seen by Dr Barnetta Chapel. Having cough, nasal congestion and drainage. He is doing NSW bid, no longer on nasal steroid or astelin. He uses chlortrimeton, not currently on loratadine or any anti-histamine. Functional capacity limited by his arthritis. Never uses SABA.    Filed Vitals:   08/26/12 1604  BP: 140/84  Pulse: 82  Temp: 97.2 F (36.2 C)   Exam:  GEN: A/Ox3; pleasant , NAD,  obese   HEENT:  /AT,  EACs-clear, TMs-wnl, NOSE-clear, THROAT-clear, no lesions, no postnasal drip or exudate noted.   NECK:  Supple w/ fair ROM; no JVD; normal carotid impulses w/o bruits; no thyromegaly or nodules palpated; no lymphadenopathy.  RESP  Coarse BS with exp wheezing, much of which is UA noise and mucous.   CARD:  RRR, no m/r/g  , no peripheral edema, pulses intact, no cyanosis or clubbing.  Musco: Warm bil, no deformities or joint swelling noted.   Neuro: alert, no focal deficits noted.    Skin: Warm, no lesions or rashes     Assessment & Plan:   SLEEP APNEA, OBSTRUCTIVE Continue CPAP as ordered  COPD - Spiriva + Symbicort, SABA prn   ALLERGIC RHINITIS Will continue the NSW's, consider adding back astelin or atrovent nasal spray in the future. He will call me if he wants to do this.

## 2012-08-26 NOTE — Patient Instructions (Addendum)
Please continue your Spiriva and Symbicort Use you CPAP every night Continue your nasal washes. If you would like to add back your nasal sprays at any point in the future please call our office and we will order Follow with Dr Delton Coombes in 6 months or sooner if you have any problems

## 2012-08-26 NOTE — Assessment & Plan Note (Signed)
-   Spiriva + Symbicort, SABA prn

## 2012-08-26 NOTE — Assessment & Plan Note (Signed)
Continue CPAP as ordered 

## 2012-08-26 NOTE — Assessment & Plan Note (Signed)
Will continue the NSW's, consider adding back astelin or atrovent nasal spray in the future. He will call me if he wants to do this.

## 2012-09-01 ENCOUNTER — Ambulatory Visit (HOSPITAL_COMMUNITY): Payer: Medicare Other | Attending: Nurse Practitioner | Admitting: Radiology

## 2012-09-01 DIAGNOSIS — R0609 Other forms of dyspnea: Secondary | ICD-10-CM | POA: Insufficient documentation

## 2012-09-01 DIAGNOSIS — R0989 Other specified symptoms and signs involving the circulatory and respiratory systems: Secondary | ICD-10-CM | POA: Diagnosis not present

## 2012-09-01 DIAGNOSIS — I4892 Unspecified atrial flutter: Secondary | ICD-10-CM | POA: Diagnosis not present

## 2012-09-01 NOTE — Progress Notes (Signed)
Echocardiogram performed.  

## 2012-09-03 DIAGNOSIS — E782 Mixed hyperlipidemia: Secondary | ICD-10-CM | POA: Diagnosis not present

## 2012-09-03 DIAGNOSIS — E559 Vitamin D deficiency, unspecified: Secondary | ICD-10-CM | POA: Diagnosis not present

## 2012-09-03 DIAGNOSIS — Z79899 Other long term (current) drug therapy: Secondary | ICD-10-CM | POA: Diagnosis not present

## 2012-09-03 DIAGNOSIS — I10 Essential (primary) hypertension: Secondary | ICD-10-CM | POA: Diagnosis not present

## 2012-09-03 DIAGNOSIS — E119 Type 2 diabetes mellitus without complications: Secondary | ICD-10-CM | POA: Diagnosis not present

## 2012-09-27 ENCOUNTER — Telehealth: Payer: Self-pay | Admitting: Emergency Medicine

## 2012-09-27 MED ORDER — TIOTROPIUM BROMIDE MONOHYDRATE 18 MCG IN CAPS: 18.0000 ug | ORAL_CAPSULE | Freq: Every day | RESPIRATORY_TRACT | Status: DC

## 2012-09-27 NOTE — Telephone Encounter (Signed)
Spoke with patient, patient requesting a 90 day supply Spiriva sent to [facility]. Rx sent and nothing further needed at this time.

## 2012-10-22 DIAGNOSIS — B351 Tinea unguium: Secondary | ICD-10-CM | POA: Diagnosis not present

## 2012-10-22 DIAGNOSIS — M79609 Pain in unspecified limb: Secondary | ICD-10-CM | POA: Diagnosis not present

## 2012-11-30 ENCOUNTER — Telehealth: Payer: Self-pay | Admitting: Emergency Medicine

## 2012-11-30 MED ORDER — TIOTROPIUM BROMIDE MONOHYDRATE 18 MCG IN CAPS: 18.0000 ug | ORAL_CAPSULE | Freq: Every day | RESPIRATORY_TRACT | Status: DC

## 2012-11-30 MED ORDER — BUDESONIDE-FORMOTEROL FUMARATE 160-4.5 MCG/ACT IN AERO: 2.0000 | INHALATION_SPRAY | Freq: Two times a day (BID) | RESPIRATORY_TRACT | Status: DC

## 2012-11-30 NOTE — Telephone Encounter (Signed)
Rx's have been sent in. Tried calling pt. The line rang then I got a busy signal. Will try again later.

## 2012-12-01 NOTE — Telephone Encounter (Signed)
Called, spoke with pt's wife (dpr on file).  Informed spiriva and symbicort rxs sent to [facility] yesterday.  She will inform pt, voiced no further questions or concerns at this time, and will call back if anything further is needed.    ** Pt has an OV with RB on tomorrow.  Wife aware of this appt.

## 2012-12-02 ENCOUNTER — Encounter: Payer: Self-pay | Admitting: Emergency Medicine

## 2012-12-02 ENCOUNTER — Ambulatory Visit (INDEPENDENT_AMBULATORY_CARE_PROVIDER_SITE_OTHER): Payer: Medicare Other | Admitting: Emergency Medicine

## 2012-12-02 VITALS — BP 142/80 | HR 80 | Temp 98.2°F | Ht 68.5 in | Wt 280.4 lb

## 2012-12-02 DIAGNOSIS — J309 Allergic rhinitis, unspecified: Secondary | ICD-10-CM

## 2012-12-02 DIAGNOSIS — J449 Chronic obstructive pulmonary disease, unspecified: Secondary | ICD-10-CM | POA: Diagnosis not present

## 2012-12-02 DIAGNOSIS — G4733 Obstructive sleep apnea (adult) (pediatric): Secondary | ICD-10-CM | POA: Diagnosis not present

## 2012-12-02 MED ORDER — PREDNISONE 10 MG PO TABS: ORAL_TABLET | ORAL | Status: DC

## 2012-12-02 NOTE — Assessment & Plan Note (Signed)
-   continue NSW, fluticasone, allegra - add back chlortrimeton prn

## 2012-12-02 NOTE — Patient Instructions (Addendum)
Please continue your Spiriva and Symbicort  Take prednisone as directed to completion Continue your nasal washes, fluticasone and allegra You can restart chlortrimeton as needed to se if it helps with your congestion Continue your CPAP every night  Follow with Dr Delton Coombes in 4 months or sooner if you have any problems.

## 2012-12-02 NOTE — Assessment & Plan Note (Signed)
Wheezing today, sounds like a mild AE likely due to allergies - pred taper - continue BD's - rov 4

## 2012-12-02 NOTE — Assessment & Plan Note (Signed)
Compliant w CPAP

## 2012-12-02 NOTE — Progress Notes (Signed)
Subjective:  Patient ID: Richard Rosales, male    DOB: [birth year], 79 y.o.   MRN: [id number] HPI PCP- [facility]  79 yo man, former smoker(quit'80)  For FU of COPD &  OSA on CPAP 12 cm. He has  hx of HTN, A Fib, chronic diastolic dysfxn and LE edema, GERD, diverticulosis, remote positive PPD.   TTE with preserved LVEF, PASP 34, not able to comment on diastolic fxn. PFTs 5/11  Showed moderate airway obstruction with FEv1 55% & good BD respnse - improved to 67%  09/15/2011 Follow up  Patient returns for a two-week followup visit. Last visit had a COPD exacerbation treated with antibiotics and steroids. Patient has had 2 COPD flares over last couple months. He feels that he is back to his baseline. He denies any chest pain, orthopnea, PND, increased leg swelling, hemoptysis, or fever. Currently taking Symbicort 2 puffs twice daily.  ROV 11/13/11 -- Hx OSA, COPD on Symbicort. Also A flutter, chronic diastolic CHF. He saw TP in March and Spiriva was added to Symbicort, he believes it has helped. He has had coughing for the last 3 weeks, one day had nausea. He has had a lot of mucous and drainage. Still using fluticasone nasal spray, loratadine. Not using NSW right now.   ROV 02/16/12 -- Hx OSA, COPD on Symbicort. Also A flutter, chronic diastolic CHF.  He is off the loratadine and fluticasone, he is using chlorpheniramine q6h. He continues to have paroxysms. He has been on allergy shots in the past, not recently. He is interested in going to Dr Barnetta Chapel. He tells me that breathing is overall the same. He is still limited in his exertion. Still on Symbicort and spiriva, no pred or abx since last time.  Reliable with CPAP.   ROV 03/22/12 -- Hx OSA, COPD on Symbicort and Spiriva. Also A flutter, chronic diastolic CHF.  He is having more mucous and cough. Reassuring eval by Dr Barnetta Chapel, better drainage with astelin/fluticasone. He is wondering if Symbicort is bothering his cough.   ROV 08/26/12 -- Hx  OSA, COPD on Symbicort and Spiriva. Also A flutter, chronic diastolic CHF. Chronic rhinitis, has been seen by Dr Barnetta Chapel. Having cough, nasal congestion and drainage. He is doing NSW bid, no longer on nasal steroid or astelin. He uses chlortrimeton, not currently on loratadine or any anti-histamine. Functional capacity limited by his arthritis. Never uses SABA.   ROV 12/02/12 -- OSA, COPD on Symbicort and Spiriva. Also A flutter, chronic diastolic CHF. Chronic rhinitis, doing NSW's. He restarted fluticasone and allegra. Not currently doing chlortrimeton. No AE's since last time. He has a SABA, has never needed to use. Good compliance w CPAP. No fevers.    Filed Vitals:   12/02/12 1611  BP: 142/80  Pulse: 80  Temp: 98.2 F (36.8 C)   Exam:  GEN: A/Ox3; pleasant , NAD,  obese   HEENT:  [place]/AT,  EACs-clear, TMs-wnl, NOSE-clear, THROAT-clear, no lesions, no postnasal drip or exudate noted.   NECK:  Supple w/ fair ROM; no JVD; normal carotid impulses w/o bruits; no thyromegaly or nodules palpated; no lymphadenopathy.  RESP  Some R > L exp wheezing, not so much UA noise this visit  CARD:  RRR, no m/r/g  , no peripheral edema, pulses intact, no cyanosis or clubbing.  Musco: Warm bil, no deformities or joint swelling noted.   Neuro: alert, no focal deficits noted.    Skin: Warm, no lesions or rashes  Assessment & Plan:   SLEEP APNEA, OBSTRUCTIVE Compliant w CPAP  COPD Wheezing today, sounds like a mild AE likely due to allergies - pred taper - continue BD's - rov 4  ALLERGIC RHINITIS - continue NSW, fluticasone, allegra - add back chlortrimeton prn

## 2012-12-10 DIAGNOSIS — Z79899 Other long term (current) drug therapy: Secondary | ICD-10-CM | POA: Diagnosis not present

## 2012-12-10 DIAGNOSIS — R7309 Other abnormal glucose: Secondary | ICD-10-CM | POA: Diagnosis not present

## 2012-12-10 DIAGNOSIS — E782 Mixed hyperlipidemia: Secondary | ICD-10-CM | POA: Diagnosis not present

## 2012-12-10 DIAGNOSIS — I10 Essential (primary) hypertension: Secondary | ICD-10-CM | POA: Diagnosis not present

## 2012-12-10 DIAGNOSIS — E559 Vitamin D deficiency, unspecified: Secondary | ICD-10-CM | POA: Diagnosis not present

## 2013-01-11 DIAGNOSIS — M79609 Pain in unspecified limb: Secondary | ICD-10-CM | POA: Diagnosis not present

## 2013-01-11 DIAGNOSIS — B351 Tinea unguium: Secondary | ICD-10-CM | POA: Diagnosis not present

## 2013-02-07 ENCOUNTER — Ambulatory Visit (INDEPENDENT_AMBULATORY_CARE_PROVIDER_SITE_OTHER): Payer: Medicare Other | Admitting: Adult Health

## 2013-02-07 ENCOUNTER — Ambulatory Visit (INDEPENDENT_AMBULATORY_CARE_PROVIDER_SITE_OTHER)
Admission: RE | Admit: 2013-02-07 | Discharge: 2013-02-07 | Disposition: A | Discharge status: Home / Self Care | Payer: Medicare Other | Source: Ambulatory Visit | Attending: Adult Health | Admitting: Adult Health

## 2013-02-07 ENCOUNTER — Encounter: Payer: Self-pay | Admitting: Adult Health

## 2013-02-07 VITALS — BP 114/70 | HR 83 | Temp 97.6°F | Ht 65.0 in | Wt 281.8 lb

## 2013-02-07 DIAGNOSIS — R05 Cough: Secondary | ICD-10-CM | POA: Diagnosis not present

## 2013-02-07 DIAGNOSIS — J449 Chronic obstructive pulmonary disease, unspecified: Secondary | ICD-10-CM

## 2013-02-07 DIAGNOSIS — R062 Wheezing: Secondary | ICD-10-CM | POA: Diagnosis not present

## 2013-02-07 DIAGNOSIS — J4489 Other specified chronic obstructive pulmonary disease: Secondary | ICD-10-CM

## 2013-02-07 MED ORDER — AZITHROMYCIN 250 MG PO TABS: ORAL_TABLET | ORAL | Status: AC

## 2013-02-07 MED ORDER — METHYLPREDNISOLONE 4 MG PO KIT: PACK | ORAL | Status: DC

## 2013-02-07 MED ORDER — LEVALBUTEROL HCL 0.63 MG/3ML IN NEBU
0.6300 mg | INHALATION_SOLUTION | Freq: Once | RESPIRATORY_TRACT | Status: AC
  Administered 2013-02-07: 0.63 mg via RESPIRATORY_TRACT

## 2013-02-07 NOTE — Assessment & Plan Note (Addendum)
Exacerbation  xopenex neb x 1 in office   Plan  Zpack take as directed  Mucinex DM Twice daily  As needed  Cough/congestion  Medrol dose pack  Chest xray today Follow up Dr. Delton Coombes  2 months and As needed   Please contact office for sooner follow up if symptoms do not improve or worsen or seek emergency care

## 2013-02-07 NOTE — Addendum Note (Signed)
Addended by: Boone Master E on: 02/07/2013 03:01 PM   Modules accepted: Orders

## 2013-02-07 NOTE — Progress Notes (Signed)
Subjective:  Patient ID: Richard Rosales, male    DOB: Nov 14, 1930, 79 y.o.   MRN: [id number] HPI PCP- [facility]  79 yo man, former smoker(quit'80)  For FU of COPD &  OSA on CPAP 12 cm. He has  hx of HTN, A Fib, chronic diastolic dysfxn and LE edema, GERD, diverticulosis, remote positive PPD.   TTE with preserved LVEF, PASP 34, not able to comment on diastolic fxn. PFTs 5/11  Showed moderate airway obstruction with FEv1 55% & good BD respnse - improved to 67%  09/15/2011 Follow up  Patient returns for a two-week followup visit. Last visit had a COPD exacerbation treated with antibiotics and steroids. Patient has had 2 COPD flares over last couple months. He feels that he is back to his baseline. He denies any chest pain, orthopnea, PND, increased leg swelling, hemoptysis, or fever. Currently taking Symbicort 2 puffs twice daily.  ROV 11/13/11 -- Hx OSA, COPD on Symbicort. Also A flutter, chronic diastolic CHF. He saw TP in March and Spiriva was added to Symbicort, he believes it has helped. He has had coughing for the last 3 weeks, one day had nausea. He has had a lot of mucous and drainage. Still using fluticasone nasal spray, loratadine. Not using NSW right now.   ROV 02/16/12 -- Hx OSA, COPD on Symbicort. Also A flutter, chronic diastolic CHF.  He is off the loratadine and fluticasone, he is using chlorpheniramine q6h. He continues to have paroxysms. He has been on allergy shots in the past, not recently. He is interested in going to Dr Barnetta Chapel. He tells me that breathing is overall the same. He is still limited in his exertion. Still on Symbicort and spiriva, no pred or abx since last time.  Reliable with CPAP.   ROV 03/22/12 -- Hx OSA, COPD on Symbicort and Spiriva. Also A flutter, chronic diastolic CHF.  He is having more mucous and cough. Reassuring eval by Dr Barnetta Chapel, better drainage with astelin/fluticasone. He is wondering if Symbicort is bothering his cough.   ROV 08/26/12 -- Hx  OSA, COPD on Symbicort and Spiriva. Also A flutter, chronic diastolic CHF. Chronic rhinitis, has been seen by Dr Barnetta Chapel. Having cough, nasal congestion and drainage. He is doing NSW bid, no longer on nasal steroid or astelin. He uses chlortrimeton, not currently on loratadine or any anti-histamine. Functional capacity limited by his arthritis. Never uses SABA.   ROV 12/02/12 -- OSA, COPD on Symbicort and Spiriva. Also A flutter, chronic diastolic CHF. Chronic rhinitis, doing NSW's. He restarted fluticasone and allegra. Not currently doing chlortrimeton. No AE's since last time. He has a SABA, has never needed to use. Good compliance w CPAP. No fevers.    02/07/2013 Acute OV  Complains of increased SOB, prod cough with clear mucus, some wheezing, throat congestion, head congestion w/ PND  - worse x3 weeks.  denies f/c/s, increased edema. No otc used.  No fever, orthopnea, chest pain, n/v.  Wears CPAP each night.     ROS:  Constitutional:   No  weight loss, night sweats,  Fevers, chills, fatigue, or  lassitude.  HEENT:   No headaches,  Difficulty swallowing,  Tooth/dental problems, or  Sore throat,                No sneezing, itching, ear ache, +nasal congestion, post nasal drip,   CV:  No chest pain,  Orthopnea, PND, + swelling in lower extremities,  Neg anasarca, dizziness, palpitations, syncope.   GI  No heartburn, indigestion, abdominal pain, nausea, vomiting, diarrhea, change in bowel habits, loss of appetite, bloody stools.   Resp:  .  No chest wall deformity  Skin: no rash or lesions.  GU: no dysuria, change in color of urine, no urgency or frequency.  No flank pain, no hematuria   MS:     No back pain.  Psych:  No change in mood or affect. No depression or anxiety.  No memory loss.       Filed Vitals:   02/07/13 1421  BP: 114/70  Pulse: 83  Temp: 97.6 F (36.4 C)   Exam:  GEN: A/Ox3; pleasant , NAD,  obese   HEENT:  [place]/AT,  EACs-clear, TMs-wnl, NOSE-clear  drainage , THROAT-clear, no lesions, no postnasal drip or exudate noted.   NECK:  Supple w/ fair ROM; no JVD; normal carotid impulses w/o bruits; no thyromegaly or nodules palpated; no lymphadenopathy.  RESP Few exp wheezes   CARD:  RRR, no m/r/g  , 1+ peripheral edema, pulses intact, no cyanosis or clubbing.  Musco: Warm bil, no deformities or joint swelling noted.   Neuro: alert, no focal deficits noted.    Skin: Warm, no lesions or rashes     Assessment & Plan:   No problem-specific assessment & plan notes found for this encounter.

## 2013-02-07 NOTE — Patient Instructions (Addendum)
Zpack take as directed  Mucinex DM Twice daily  As needed  Cough/congestion  Medrol dose pack  Chest xray today Follow up Dr. Delton Coombes  2 months and As needed   Please contact office for sooner follow up if symptoms do not improve or worsen or seek emergency care

## 2013-02-07 NOTE — Addendum Note (Signed)
Addended by: Boone Master E on: 02/07/2013 03:06 PM   Modules accepted: Orders

## 2013-02-22 ENCOUNTER — Other Ambulatory Visit: Payer: Self-pay | Admitting: *Deleted

## 2013-02-23 ENCOUNTER — Encounter: Payer: Self-pay | Admitting: Internal Medicine

## 2013-02-23 ENCOUNTER — Ambulatory Visit (INDEPENDENT_AMBULATORY_CARE_PROVIDER_SITE_OTHER): Payer: Medicare Other | Admitting: Internal Medicine

## 2013-02-23 VITALS — BP 128/69 | HR 60 | Ht 69.5 in | Wt 182.4 lb

## 2013-02-23 DIAGNOSIS — I4892 Unspecified atrial flutter: Secondary | ICD-10-CM | POA: Diagnosis not present

## 2013-02-23 DIAGNOSIS — I10 Essential (primary) hypertension: Secondary | ICD-10-CM | POA: Diagnosis not present

## 2013-02-23 DIAGNOSIS — I4891 Unspecified atrial fibrillation: Secondary | ICD-10-CM

## 2013-02-23 DIAGNOSIS — R0602 Shortness of breath: Secondary | ICD-10-CM

## 2013-02-23 DIAGNOSIS — I5032 Chronic diastolic (congestive) heart failure: Secondary | ICD-10-CM | POA: Diagnosis not present

## 2013-02-23 LAB — BASIC METABOLIC PANEL
Chloride: 101 mEq/L (ref 96–112)
Potassium: 3.6 mEq/L (ref 3.5–5.1)

## 2013-02-23 MED ORDER — MINOXIDIL 2.5 MG PO TABS: ORAL_TABLET | ORAL | Status: DC

## 2013-02-23 NOTE — Patient Instructions (Signed)
Your physician wants you to follow-up in: 6 months with Sunday Spillers and 12 months Dr Johney Frame Bonita Quin will receive a reminder letter in the mail two months in advance. If you don't receive a letter, please call our office to schedule the follow-up appointment.  Your physician recommends that you return for lab work today: [facility]  Your physician has recommended you make the following change in your medication:  1) Decrease Minoxidil to 2.5mg  daily 2) Increase Losartan ---call me with dose you are taking [phone] Tresa Endo)

## 2013-02-23 NOTE — Progress Notes (Signed)
PCP: Nadean Corwin, MD  The patient presents today for routine cardiology followup.  Since last being seen in our clinic, the patient reports doing reasonably well. His SOB is stable.  His primary concern is with feet pain.   Today, he denies symptoms of palpitations, chest pain,  dizziness, presyncope, syncope, or neurologic sequela.  He has stable edema with support hose.  He reports drowsiness at times with his medicine.  The patient feels that he is tolerating medications without difficulties and is otherwise without complaint today.    Past Medical History  Diagnosis Date  . HTN (hypertension)   . Diastolic dysfunction   . Atrial fibrillation   . GERD (gastroesophageal reflux disease)   . Allergic rhinitis   . Hypothyroidism   . DJD (degenerative joint disease)   . Diverticulosis of colon     recurrent GI bleeding  . OSA (obstructive sleep apnea)     compliants w/ CPAP  . Hyperplastic colon polyp 2003  . Chronic gastritis   . Positive PPD     remote  . Atrial flutter   . Acute bronchitis   . Anemia     iron deficient  . Shortness of breath   . COPD (chronic obstructive pulmonary disease)   . Venous insufficiency   . Moderate aortic insufficiency    Past Surgical History  Procedure Laterality Date  . Cardiac catheterization  7.27.05    revealed a preserved EF with no CAD  . Tonsillectomy    . Total knee arthroplasty      bilateral  . Nasal sinus surgery    . Colonoscopy    . Joint replacement  2001    lt total knee-rt one done 00  . Hernia repair      bilateral  . Hernia repair    . Transurethral resection of prostate      Current Outpatient Prescriptions  Medication Sig Dispense Refill  . acetaminophen-codeine (TYLENOL #3) 300-30 MG per tablet Take 1 tablet by mouth every 4 (four) hours as needed.      Marland Kitchen aspirin 81 MG tablet Take 81 mg by mouth. 3 days per week      . azelastine (ASTELIN) 137 MCG/SPRAY nasal spray Place 1 spray into the nose at bedtime.  Use in each nostril as directed      . b complex vitamins tablet Take 1 tablet by mouth every morning.       . budesonide-formoterol (SYMBICORT) 160-4.5 MCG/ACT inhaler Inhale 2 puffs into the lungs 2 (two) times daily.  2 Inhaler  1  . Cholecalciferol (VITAMIN D) 2000 UNITS CAPS Take 2 capsules by mouth daily.       Marland Kitchen DILTIAZEM HCL ER PO Take by mouth. 240mg  tab take 1 tab twice a day      . docusate sodium (COLACE) 100 MG capsule Take 100 mg by mouth 2 (two) times daily.        . fexofenadine (ALLEGRA) 180 MG tablet Take 180 mg by mouth daily.      . fish oil-omega-3 fatty acids 1000 MG capsule Take 1 g by mouth 2 (two) times daily.        . fluticasone (FLONASE) 50 MCG/ACT nasal spray USE 2 SPRAYS IN EACH NOSTRIL TWICE A DAY  3 g  0  . fluticasone (FLONASE) 50 MCG/ACT nasal spray Place 2 sprays into the nose daily.      . furosemide (LASIX) 40 MG tablet Take 40 mg by mouth daily.       Marland Kitchen  Glucosamine HCl 1000 MG TABS Take 1 tablet by mouth daily.        Marland Kitchen guaifenesin (HUMIBID E) 400 MG TABS Take 400 mg by mouth 2 (two) times daily.        Marland Kitchen LEVOXYL 100 MCG tablet Take 100 mcg by mouth Every morning.      Marland Kitchen LOSARTAN POTASSIUM PO Take 1 tablet by mouth daily.      . minoxidil (LONITEN) 10 MG tablet Take 5 mg by mouth daily. 1/2 tab by mouth at bedtime      . omeprazole (PRILOSEC) 20 MG capsule Take 20 mg by mouth Twice daily before meals.      Marland Kitchen PROVIGIL 200 MG tablet Take 100 mg by mouth daily as needed. 1/2 tab by mouth on Wed and Sun when pt drives      . Psyllium (METAMUCIL) 28.3 % POWD 1 scoop twice daily       . tiotropium (SPIRIVA HANDIHALER) 18 MCG inhalation capsule Place 1 capsule (18 mcg total) into inhaler and inhale daily.  90 capsule  1   No current facility-administered medications for this visit.    Allergies  Allergen Reactions  . Fluticasone-Salmeterol     Unable to remember per pt  . Prednisone     Shakes / tremors    History   Social History  . Marital Status:  Married    Spouse Name: N/A    Number of Children: 4  . Years of Education: N/A   Occupational History  . part-time as a Education officer, environmental at [employer]    Social History Main Topics  . Smoking status: Former Smoker -- 1.00 packs/day for 20 years    Types: Cigarettes    Quit date: 08/01/1978  . Smokeless tobacco: Never Used  . Alcohol Use: Yes     Comment: a glass of wine a day  . Drug Use: No  . Sexual Activity: Not on file   Other Topics Concern  . 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    Family History  Problem Relation Age of Onset  . Hypertension Mother   . Diabetes Father   . Lung cancer Brother     chemical     Physical Exam: Filed Vitals:   02/23/13 1431  BP: 128/69  Pulse: 60  Height: 5' 9.5" (1.765 m)  Weight: 182 lb 6.4 oz (82.736 kg)    GEN- The patient is overweight appearing, alert and oriented x 3 today.   Head- normocephalic, atraumatic Eyes-  Sclera clear, conjunctiva pink Ears- hearing intact Oropharynx- clear Neck- supple, JVP 9cm Lymph- no cervical lymphadenopathy Lungs- coarse breath sounds with a prolonged expiratory phase Heart- irregular rate and rhythm, no murmurs, rubs or gallops, PMI not laterally displaced GI- soft, NT, ND, + BS Extremities- no clubbing, cyanosis, 1+ edema, support hose are in place MS- walks slowly with a cane Skin- no rash or lesion Psych- euthymic mood, full affect Neuro- strength and sensation are intact  ekg today reveals afib , V rate 63 bpm, IVCD Echo 3/14 reviewed with the  Patient today  Assessment and Plan:  1. SOB- respiratory in etiology,  Wheezing quite a bit today He will followup with Dr Delton Coombes  2. Hypertension Reports drowsiness, likely due to minoxidil Will decrease minoxidil to 2.5mg  daily.  Hopefully we can eventually wean this  to off Check bmet  today Increase losartan if able (he will call us with his home dosing)  3. Permanent afib Declines anticoagulation again today (states "I dont even want to talk about that"  4. Moderate AI Repeat echo upon return in 6 months   Follow-up with Lawson Fiscal in 6 months I will see in a year

## 2013-02-24 ENCOUNTER — Telehealth: Payer: Self-pay | Admitting: *Deleted

## 2013-02-24 DIAGNOSIS — I10 Essential (primary) hypertension: Secondary | ICD-10-CM

## 2013-02-24 MED ORDER — SPIRONOLACTONE 25 MG PO TABS: 25.0000 mg | ORAL_TABLET | Freq: Every day | ORAL | Status: DC

## 2013-02-24 NOTE — Telephone Encounter (Signed)
called patient with lab results and new rx sent in for Spironolactone 25mg  daily

## 2013-02-25 ENCOUNTER — Ambulatory Visit (INDEPENDENT_AMBULATORY_CARE_PROVIDER_SITE_OTHER): Payer: Medicare Other | Admitting: Pulmonary Disease

## 2013-02-25 ENCOUNTER — Encounter: Payer: Self-pay | Admitting: Pulmonary Disease

## 2013-02-25 VITALS — BP 122/74 | HR 57 | Temp 98.7°F | Ht 69.0 in | Wt 281.4 lb

## 2013-02-25 DIAGNOSIS — J441 Chronic obstructive pulmonary disease with (acute) exacerbation: Secondary | ICD-10-CM

## 2013-02-25 MED ORDER — METHYLPREDNISOLONE 16 MG PO TABS: ORAL_TABLET | ORAL | Status: DC

## 2013-02-25 NOTE — Progress Notes (Signed)
  Subjective:    Patient ID: Richard Rosales, male    DOB: [birth year], 79 y.o.   MRN: [id number]  HPI The patient comes in today for an acute sick visit.  He has been COPD, was seen by our nurse practitioner a few weeks ago with what sounds like asthmatic bronchitis.  He was treated with a Z-Pak, as well as a Medrol taper.  He did improve, but one week after finishing his steroid taper he began to have increasing "congestion", as well as cough and increased shortness of breath.  His mucus is clear, and he thinks a lot of it may be coming from postnasal drip.  He does have a lot of upper airway noise.   Review of Systems  Constitutional: Negative for fever and unexpected weight change.  HENT: Negative for ear pain, nosebleeds, congestion, sore throat, rhinorrhea, sneezing, trouble swallowing, dental problem, postnasal drip and sinus pressure.   Eyes: Negative for redness and itching.  Respiratory: Positive for cough, chest tightness and shortness of breath. Negative for wheezing.   Cardiovascular: Negative for palpitations and leg swelling.  Gastrointestinal: Negative for nausea and vomiting.  Genitourinary: Negative for dysuria.  Musculoskeletal: Negative for joint swelling.  Skin: Negative for rash.  Neurological: Negative for headaches.  Hematological: Does not bruise/bleed easily.  Psychiatric/Behavioral: Negative for dysphoric mood. The patient is not nervous/anxious.        Objective:   Physical Exam Obese male in no acute distress Nose without purulence or discharge noted Neck without lymphadenopathy or thyromegaly Chest with mildly decreased breath sounds, primarily in the bases, no true wheezing, prominent upper airway pseudo-wheezing Cardiac exam regular rate and rhythm Lower extremities with 3+ edema, no cyanosis Alert and oriented, moves all 4 extremities.       Assessment & Plan:

## 2013-02-25 NOTE — Assessment & Plan Note (Signed)
From the patient's history, I suspect he has residual airway inflammation after his last flareup a few weeks ago.  I do not think he has a pulmonary infection at this time.  We'll treat with another course of Medrol to see if this gets him back to baseline.  He is also having postnasal drip, and I suspect this is responsible for a lot of his "congestion".  I have asked him to change his Allegra to chlorpheniramine to see if that helps

## 2013-02-25 NOTE — Patient Instructions (Addendum)
Will treat with another medrol taper over 8 days. Try chlorpheniramine 4mg  2 at bedtime and one at lunch for a week or so to see if helps postnasal drip (stop allergra) Use nasal saline rinses. Keep followup with Dr. Delton Coombes, but call if you are not improving.

## 2013-03-10 ENCOUNTER — Telehealth: Payer: Self-pay | Admitting: Emergency Medicine

## 2013-03-10 DIAGNOSIS — J449 Chronic obstructive pulmonary disease, unspecified: Secondary | ICD-10-CM | POA: Diagnosis not present

## 2013-03-10 DIAGNOSIS — I10 Essential (primary) hypertension: Secondary | ICD-10-CM | POA: Diagnosis not present

## 2013-03-10 MED ORDER — BUDESONIDE-FORMOTEROL FUMARATE 160-4.5 MCG/ACT IN AERO: 2.0000 | INHALATION_SPRAY | Freq: Two times a day (BID) | RESPIRATORY_TRACT | Status: DC

## 2013-03-10 NOTE — Telephone Encounter (Signed)
Last ov 9.5.14 with KC as acute visit  Pt returned call.  Spoke with patient who verified that he is needing a refill on his symbicort to [facility].  Rx sent.  Nothing further needed at this time; will sign off.

## 2013-03-10 NOTE — Telephone Encounter (Signed)
ATC patient no answer Phone rang several times with option to LM  [facility]

## 2013-03-28 DIAGNOSIS — J3089 Other allergic rhinitis: Secondary | ICD-10-CM | POA: Diagnosis not present

## 2013-03-28 DIAGNOSIS — J449 Chronic obstructive pulmonary disease, unspecified: Secondary | ICD-10-CM | POA: Diagnosis not present

## 2013-03-30 ENCOUNTER — Encounter (INDEPENDENT_AMBULATORY_CARE_PROVIDER_SITE_OTHER): Payer: Self-pay

## 2013-03-30 ENCOUNTER — Ambulatory Visit (INDEPENDENT_AMBULATORY_CARE_PROVIDER_SITE_OTHER): Payer: Medicare Other | Admitting: Emergency Medicine

## 2013-03-30 ENCOUNTER — Encounter: Payer: Self-pay | Admitting: Emergency Medicine

## 2013-03-30 VITALS — BP 120/72 | HR 63 | Ht 69.0 in | Wt 271.0 lb

## 2013-03-30 DIAGNOSIS — G4733 Obstructive sleep apnea (adult) (pediatric): Secondary | ICD-10-CM

## 2013-03-30 DIAGNOSIS — J449 Chronic obstructive pulmonary disease, unspecified: Secondary | ICD-10-CM | POA: Diagnosis not present

## 2013-03-30 NOTE — Progress Notes (Signed)
Subjective:  Patient ID: Richard Rosales, male    DOB: 03-29-1931, 79 y.o.   MRN: [id number] HPI PCP- [facility]  79 yo man, former smoker(quit'80)  For FU of COPD &  OSA on CPAP 12 cm. He has  hx of HTN, A Fib, chronic diastolic dysfxn and LE edema, GERD, diverticulosis, remote positive PPD.   TTE with preserved LVEF, PASP 34, not able to comment on diastolic fxn. PFTs 5/11  Showed moderate airway obstruction with FEv1 55% & good BD respnse - improved to 67%  09/15/2011 Follow up  Patient returns for a two-week followup visit. Last visit had a COPD exacerbation treated with antibiotics and steroids. Patient has had 2 COPD flares over last couple months. He feels that he is back to his baseline. He denies any chest pain, orthopnea, PND, increased leg swelling, hemoptysis, or fever. Currently taking Symbicort 2 puffs twice daily.  ROV 11/13/11 -- Hx OSA, COPD on Symbicort. Also A flutter, chronic diastolic CHF. He saw TP in March and Spiriva was added to Symbicort, he believes it has helped. He has had coughing for the last 3 weeks, one day had nausea. He has had a lot of mucous and drainage. Still using fluticasone nasal spray, loratadine. Not using NSW right now.   ROV 02/16/12 -- Hx OSA, COPD on Symbicort. Also A flutter, chronic diastolic CHF.  He is off the loratadine and fluticasone, he is using chlorpheniramine q6h. He continues to have paroxysms. He has been on allergy shots in the past, not recently. He is interested in going to Dr Barnetta Chapel. He tells me that breathing is overall the same. He is still limited in his exertion. Still on Symbicort and spiriva, no pred or abx since last time.  Reliable with CPAP.   ROV 03/22/12 -- Hx OSA, COPD on Symbicort and Spiriva. Also A flutter, chronic diastolic CHF.  He is having more mucous and cough. Reassuring eval by Dr Barnetta Chapel, better drainage with astelin/fluticasone. He is wondering if Symbicort is bothering his cough.   ROV 08/26/12 -- Hx  OSA, COPD on Symbicort and Spiriva. Also A flutter, chronic diastolic CHF. Chronic rhinitis, has been seen by Dr Barnetta Chapel. Having cough, nasal congestion and drainage. He is doing NSW bid, no longer on nasal steroid or astelin. He uses chlortrimeton, not currently on loratadine or any anti-histamine. Functional capacity limited by his arthritis. Never uses SABA.   ROV 12/02/12 -- OSA, COPD on Symbicort and Spiriva. Also A flutter, chronic diastolic CHF. Chronic rhinitis, doing NSW's. He restarted fluticasone and allegra. Not currently doing chlortrimeton. No AE's since last time. He has a SABA, has never needed to use. Good compliance w CPAP. No fevers.   Acute OV  Complains of increased SOB, prod cough with clear mucus, some wheezing, throat congestion, head congestion w/ PND  - worse x3 weeks.  denies f/c/s, increased edema. No otc used.  No fever, orthopnea, chest pain, n/v.  Wears CPAP each night.   ROV 03/30/13 -- OSA, COPD on Symbicort and Spiriva. Also A flutter, chronic diastolic CHF. He saw Dr Shelle Iron 02/25/13 for persistent sx after a COPD flare.  It is very difficult for him to take corticosteroids, gets side effects of tremor, aches, mood swings, etc. He changed allegra to chlorpheniramine, seems to be working better. He is sleeping better, has some wheeze that seems to be UA. His GERD is treated.     Filed Vitals:   03/30/13 1507  BP: 120/72  Pulse:  63  Height: 5\' 9"  (1.753 m)  Weight: 271 lb (122.925 kg)  SpO2: 95%    Exam:  GEN: A/Ox3; pleasant , NAD,  obese   HEENT:  [place]/AT,  EACs-clear, TMs-wnl, NOSE-clear drainage , THROAT-clear, no lesions, no postnasal drip or exudate noted.   NECK:  Supple w/ fair ROM; no JVD; normal carotid impulses w/o bruits; no thyromegaly or nodules palpated; no lymphadenopathy.  RESP Few exp wheezes   CARD:  RRR, no m/r/g  , 1+ peripheral edema, pulses intact, no cyanosis or clubbing.  Musco: Warm bil, no deformities or joint swelling noted.    Neuro: alert, no focal deficits noted.    Skin: Warm, no lesions or rashes     Assessment & Plan:   COPD Please continue your current medications including chlorpheniramine, mucinex, nasal saline, astelin nasal spray, spiriva, symbicort.  Follow with Dr Delton Coombes in 3 months or sooner if you have any problems.  SLEEP APNEA, OBSTRUCTIVE Continue CPAP

## 2013-03-30 NOTE — Patient Instructions (Signed)
Please continue your current medications including chlorpheniramine, mucinex, nasal saline, astelin nasal spray, spiriva, symbicort.  Follow with Dr Delton Coombes in 3 months or sooner if you have any problems.

## 2013-03-30 NOTE — Assessment & Plan Note (Signed)
Continue CPAP.  

## 2013-03-30 NOTE — Assessment & Plan Note (Signed)
Please continue your current medications including chlorpheniramine, mucinex, nasal saline, astelin nasal spray, spiriva, symbicort.  Follow with Dr Alvena Kiernan in 3 months or sooner if you have any problems.  

## 2013-04-07 ENCOUNTER — Other Ambulatory Visit (INDEPENDENT_AMBULATORY_CARE_PROVIDER_SITE_OTHER): Payer: Medicare Other

## 2013-04-07 DIAGNOSIS — I10 Essential (primary) hypertension: Secondary | ICD-10-CM

## 2013-04-07 LAB — BASIC METABOLIC PANEL
BUN: 12 mg/dL (ref 6–23)
Creatinine, Ser: 1 mg/dL (ref 0.4–1.5)
GFR: 78.14 mL/min (ref >60.00)
Glucose, Bld: 100 mg/dL — ABNORMAL HIGH (ref 70–99)
Potassium: 5.1 mEq/L (ref 3.5–5.1)

## 2013-04-19 ENCOUNTER — Ambulatory Visit (INDEPENDENT_AMBULATORY_CARE_PROVIDER_SITE_OTHER): Payer: Medicare Other

## 2013-04-19 VITALS — BP 167/82 | HR 67 | Resp 20 | Ht 69.0 in | Wt 269.0 lb

## 2013-04-19 DIAGNOSIS — B351 Tinea unguium: Secondary | ICD-10-CM

## 2013-04-19 DIAGNOSIS — M79609 Pain in unspecified limb: Secondary | ICD-10-CM

## 2013-04-19 NOTE — Patient Instructions (Signed)

## 2013-04-19 NOTE — Progress Notes (Signed)
  Subjective:    Patient ID: Richard Rosales, male    DOB: 1930-09-29, 79 y.o.   MRN: [id number] "I'm going to get the nails clipped."  Nails thick brittle crumbly and discolored 2 through 5 bilateral. Both hallux nails previously avulsed also shows kyphosis of the proximal nail    HPI no changes medication her health  Review of Systems deferred at this     Objective:   Physical Exam Neurovascular status is intact with pedal pulses palpable DP +2/4 PT + over 4 bilateral. +2 edema bilateral no varicosities noted. Neurologically epicritic and proprioceptive sensations are intact and symmetric bilateral there is normal plantar response and DTRs. Orthopedic biomechanical exam reveals rectus foot type no osseous abnormalities mild flexible digital contractures noted. Dermatologically skin color pigment normal hair growth absent. Nails thick friable discolored 2 through 5 bilateral painful and tender both on palpation and attempted debridement and with ambulation with enclosed shoe.     Assessment & Plan:  Assessment this time is onychomycosis the nails with dystrophy pain tenderness discomfort 2 through 5 bilateral. Painful mycotic nails debrided x8 plan at this time continue followup in reappointed 3 months for continued palliative care. Maintain accommodative shoes as recommended. Also advised to maintain compression stockings as cortisone is a regular basis as instructed.  Alvan Dame DPM

## 2013-05-09 ENCOUNTER — Other Ambulatory Visit: Payer: Self-pay | Admitting: *Deleted

## 2013-05-09 MED ORDER — SPIRONOLACTONE 25 MG PO TABS: 25.0000 mg | ORAL_TABLET | Freq: Every day | ORAL | Status: DC

## 2013-05-25 ENCOUNTER — Encounter: Payer: Self-pay | Admitting: Pulmonary Disease

## 2013-05-25 ENCOUNTER — Ambulatory Visit (INDEPENDENT_AMBULATORY_CARE_PROVIDER_SITE_OTHER): Payer: Medicare Other | Admitting: Pulmonary Disease

## 2013-05-25 VITALS — BP 124/84 | HR 66 | Temp 98.1°F | Ht 68.0 in | Wt 265.0 lb

## 2013-05-25 DIAGNOSIS — J441 Chronic obstructive pulmonary disease with (acute) exacerbation: Secondary | ICD-10-CM | POA: Diagnosis not present

## 2013-05-25 MED ORDER — AZITHROMYCIN 250 MG PO TABS: ORAL_TABLET | ORAL | Status: DC

## 2013-05-25 NOTE — Patient Instructions (Signed)
Zithromax 250 mg pill >> 2 pills on day 1, then 1 pill daily for 4 days Call if not feeling better Follow up with Dr. Delton Coombes in 1 month

## 2013-05-25 NOTE — Progress Notes (Signed)
Chief Complaint  Patient presents with  . Acute Visit    Reports SOB and coughing x2 weeks. Cough is productive of clear mucus with very little yellow.    History of Present Illness: Richard Rosales is a 79 y.o. male with COPD.  He is followed by Dr. Delton Coombes.  He is here for an acute visit.  He has noticed more trouble with cough and chest congestion for the past several days.  He has been feeling more short of breath.  He is getting some wheezing also.  He has clear to yellow sputum.  He denies hemoptysis, or fever.  He has been feeling more tired.    He has chronic sinus congestion, and this is worse than usual.  He is using nasal irrigation and astelin daily.  He has been using his albuterol more over the past several days >> this helps some.  He is not able to tolerate systemic corticosteroids >> reports he feels very anxious regardless of dose.   Richard Rosales  has a past medical history of HTN (hypertension); Diastolic dysfunction; Atrial fibrillation; GERD (gastroesophageal reflux disease); Allergic rhinitis; Hypothyroidism; DJD (degenerative joint disease); Diverticulosis of colon; OSA (obstructive sleep apnea); Hyperplastic colon polyp (2003); Chronic gastritis; Positive PPD; Atrial flutter; Acute bronchitis; Anemia; Shortness of breath; COPD (chronic obstructive pulmonary disease); Venous insufficiency; and Moderate aortic insufficiency.  Richard Rosales  has past surgical history that includes Cardiac catheterization (7.27.05); Tonsillectomy; Total knee arthroplasty; Nasal sinus surgery; Colonoscopy; Joint replacement (2001); Hernia repair; Hernia repair; and Transurethral resection of prostate.  Prior to Admission medications   Medication Sig Start Date End Date Taking? Authorizing Provider  acetaminophen-codeine (TYLENOL #3) 300-30 MG per tablet Take 1 tablet by mouth every 4 (four) hours as needed.    Historical Provider, MD  aspirin 81 MG tablet Take 81 mg by mouth. 3 days  per week    Historical Provider, MD  azelastine (ASTELIN) 137 MCG/SPRAY nasal spray Place 1 spray into the nose at bedtime. Use in each nostril as directed    Historical Provider, MD  b complex vitamins tablet Take 1 tablet by mouth every morning.     Historical Provider, MD  budesonide-formoterol (SYMBICORT) 160-4.5 MCG/ACT inhaler Inhale 2 puffs into the lungs 2 (two) times daily. 03/10/13   Leslye Peer, MD  Cholecalciferol (VITAMIN D) 2000 UNITS CAPS Take 2 capsules by mouth daily.     Historical Provider, MD  DILTIAZEM HCL ER PO Take by mouth. 240mg  tab take 1 tab twice a day    Historical Provider, MD  docusate sodium (COLACE) 100 MG capsule Take 100 mg by mouth 2 (two) times daily.      Historical Provider, MD  fish oil-omega-3 fatty acids 1000 MG capsule Take 1 g by mouth 2 (two) times daily.      Historical Provider, MD  furosemide (LASIX) 40 MG tablet Take 40 mg by mouth daily.     Historical Provider, MD  Glucosamine HCl 1000 MG TABS Take 1 tablet by mouth daily.      Historical Provider, MD  guaifenesin (HUMIBID E) 400 MG TABS Take 400 mg by mouth 2 (two) times daily.      Historical Provider, MD  LEVOXYL 100 MCG tablet Take 100 mcg by mouth Every morning. 09/07/10   Historical Provider, MD  LOSARTAN POTASSIUM PO Take 1 tablet by mouth daily.    Historical Provider, MD  minoxidil (LONITEN) 2.5 MG tablet Take daily at bedtime 02/23/13  Hillis Range, MD  omeprazole (PRILOSEC) 20 MG capsule Take 20 mg by mouth Twice daily before meals. 07/27/10   Historical Provider, MD  PROVIGIL 200 MG tablet Take 100 mg by mouth daily as needed. 1/2 tab by mouth on Wed and Sun when pt drives 08/29/10   Historical Provider, MD  Psyllium (METAMUCIL) 28.3 % POWD 1 scoop twice daily     Historical Provider, MD  spironolactone (ALDACTONE) 25 MG tablet Take 1 tablet (25 mg total) by mouth daily. 05/09/13   Hillis Range, MD  tiotropium (SPIRIVA HANDIHALER) 18 MCG inhalation capsule Place 1 capsule (18 mcg total)  into inhaler and inhale daily. 11/30/12   Leslye Peer, MD    Allergies  Allergen Reactions  . Fluticasone-Salmeterol     Unable to remember per pt  . Prednisone     Shakes / tremors     Physical Exam:  General - No distress ENT - No sinus tenderness, no oral exudate, no LAN Cardiac - s1s2 regular, no murmur Chest - b/l rhonchi, scattered wheeze Back - No focal tenderness Abd - Soft, non-tender Ext - No edema Neuro - Normal strength Skin - No rashes Psych - normal mood, and behavior   Assessment/Plan:  Coralyn Helling, MD [facility] Pager:  [phone]

## 2013-05-25 NOTE — Assessment & Plan Note (Signed)
Will give him course of zithromax.  He would likely also benefit from course of prednisone, but he is reluctant to use this due to concern for side effects and intolerance.  He is to continue symbicort, spiriva, and prn albuterol.  Advised him to call if his symptoms do not improve.

## 2013-05-31 ENCOUNTER — Telehealth: Payer: Self-pay | Admitting: Emergency Medicine

## 2013-05-31 MED ORDER — HYDROCODONE-HOMATROPINE 5-1.5 MG/5ML PO SYRP: 5.0000 mL | ORAL_SOLUTION | Freq: Four times a day (QID) | ORAL | Status: DC | PRN

## 2013-05-31 NOTE — Telephone Encounter (Signed)
Please call hycodan with standard instructions. He should get an OV with me or TP if he continues to have problems this week.

## 2013-05-31 NOTE — Telephone Encounter (Signed)
I called and spoke with pt. He saw VS 05/25/13 for acute visit and was giving ZPAK. He c/o productive cough w/ yellow phlem. He finished ZPAK Sunday. He is requesting to have something called in for his cough. Please advise RB thanks  Allergies  Allergen Reactions  . Fluticasone-Salmeterol     Unable to remember per pt  . Prednisone     Shakes / tremors

## 2013-05-31 NOTE — Telephone Encounter (Signed)
i called and spoke with pt. Aware of recs. rx printed and pt will p/u tomorrow. Nothing further needed

## 2013-06-08 DIAGNOSIS — H2510 Age-related nuclear cataract, unspecified eye: Secondary | ICD-10-CM | POA: Diagnosis not present

## 2013-06-08 DIAGNOSIS — H43819 Vitreous degeneration, unspecified eye: Secondary | ICD-10-CM | POA: Diagnosis not present

## 2013-06-08 DIAGNOSIS — H04129 Dry eye syndrome of unspecified lacrimal gland: Secondary | ICD-10-CM | POA: Diagnosis not present

## 2013-06-08 DIAGNOSIS — H0230 Blepharochalasis unspecified eye, unspecified eyelid: Secondary | ICD-10-CM | POA: Diagnosis not present

## 2013-06-12 ENCOUNTER — Encounter: Payer: Self-pay | Admitting: Internal Medicine

## 2013-06-13 ENCOUNTER — Encounter: Payer: Self-pay | Admitting: Internal Medicine

## 2013-06-13 ENCOUNTER — Ambulatory Visit (INDEPENDENT_AMBULATORY_CARE_PROVIDER_SITE_OTHER): Payer: Medicare Other | Admitting: Internal Medicine

## 2013-06-13 VITALS — BP 120/66 | HR 72 | Temp 98.8°F | Resp 18 | Ht 68.5 in | Wt 267.0 lb

## 2013-06-13 DIAGNOSIS — Z1212 Encounter for screening for malignant neoplasm of rectum: Secondary | ICD-10-CM

## 2013-06-13 DIAGNOSIS — R7309 Other abnormal glucose: Secondary | ICD-10-CM | POA: Diagnosis not present

## 2013-06-13 DIAGNOSIS — Z79899 Other long term (current) drug therapy: Secondary | ICD-10-CM

## 2013-06-13 DIAGNOSIS — I10 Essential (primary) hypertension: Secondary | ICD-10-CM

## 2013-06-13 DIAGNOSIS — E782 Mixed hyperlipidemia: Secondary | ICD-10-CM | POA: Diagnosis not present

## 2013-06-13 DIAGNOSIS — E559 Vitamin D deficiency, unspecified: Secondary | ICD-10-CM | POA: Diagnosis not present

## 2013-06-13 DIAGNOSIS — Z125 Encounter for screening for malignant neoplasm of prostate: Secondary | ICD-10-CM

## 2013-06-13 DIAGNOSIS — Z0000 Encounter for general adult medical examination without abnormal findings: Secondary | ICD-10-CM

## 2013-06-13 LAB — CBC WITH DIFFERENTIAL/PLATELET
Basophils Relative: 1 % (ref 0–1)
Eosinophils Absolute: 0.6 10*3/uL (ref 0.0–0.7)
HCT: 44.6 % (ref 39.0–52.0)
Hemoglobin: 15 g/dL (ref 13.0–17.0)
Lymphocytes Relative: 20 % (ref 12–46)
Lymphs Abs: 1.6 10*3/uL (ref 0.7–4.0)
MCH: 31 pg (ref 26.0–34.0)
MCHC: 33.6 g/dL (ref 30.0–36.0)
MCV: 92.1 fL (ref 78.0–100.0)
Monocytes Absolute: 0.8 10*3/uL (ref 0.1–1.0)
Monocytes Relative: 10 % (ref 3–12)
Neutro Abs: 4.9 10*3/uL (ref 1.7–7.7)
Neutrophils Relative %: 62 % (ref 43–77)
RBC: 4.84 MIL/uL (ref 4.22–5.81)

## 2013-06-13 LAB — HEPATIC FUNCTION PANEL
ALT: 19 U/L (ref 0–53)
AST: 22 U/L (ref 0–37)
Alkaline Phosphatase: 68 U/L (ref 39–117)
Bilirubin, Direct: 0.2 mg/dL (ref 0.0–0.3)
Indirect Bilirubin: 0.5 mg/dL (ref 0.0–0.9)
Total Bilirubin: 0.7 mg/dL (ref 0.3–1.2)
Total Protein: 6.5 g/dL (ref 6.0–8.3)

## 2013-06-13 LAB — BASIC METABOLIC PANEL WITH GFR
BUN: 12 mg/dL (ref 6–23)
CO2: 29 mEq/L (ref 19–32)
Chloride: 97 mEq/L (ref 96–112)
Glucose, Bld: 83 mg/dL (ref 70–99)
Potassium: 4.4 mEq/L (ref 3.5–5.3)
Sodium: 138 mEq/L (ref 135–145)

## 2013-06-13 LAB — TSH: TSH: 2.218 u[IU]/mL (ref 0.350–4.500)

## 2013-06-13 LAB — HEMOGLOBIN A1C
Hgb A1c MFr Bld: 5.7 % — ABNORMAL HIGH (ref <5.7)
Mean Plasma Glucose: 117 mg/dL — ABNORMAL HIGH (ref <117)

## 2013-06-13 LAB — MAGNESIUM: Magnesium: 2 mg/dL (ref 1.5–2.5)

## 2013-06-13 LAB — LIPID PANEL
LDL Cholesterol: 92 mg/dL (ref 0–99)
Total CHOL/HDL Ratio: 3.5 Ratio
VLDL: 21 mg/dL (ref 0–40)

## 2013-06-13 NOTE — Patient Instructions (Addendum)
Continue diet & medications same as discussed.   Further disposition pending lab results.     Diverticulosis Diverticulosis is a common condition that develops when small pouches (diverticula) form in the wall of the colon. The risk of diverticulosis increases with age. It happens more often in people who eat a low-fiber diet. Most individuals with diverticulosis have no symptoms. Those individuals with symptoms usually experience abdominal pain, constipation, or loose stools (diarrhea). HOME CARE INSTRUCTIONS   Increase the amount of fiber in your diet as directed by your caregiver or dietician. This may reduce symptoms of diverticulosis.  Your caregiver may recommend taking a dietary fiber supplement.  Drink at least 6 to 8 glasses of water each day to prevent constipation.  Try not to strain when you have a bowel movement.  Your caregiver may recommend avoiding nuts and seeds to prevent complications, although this is still an uncertain benefit.  Only take over-the-counter or prescription medicines for pain, discomfort, or fever as directed by your caregiver. FOODS WITH HIGH FIBER CONTENT INCLUDE:  Fruits. Apple, peach, pear, tangerine, raisins, prunes.  Vegetables. Brussels sprouts, asparagus, broccoli, cabbage, carrot, cauliflower, romaine lettuce, spinach, summer squash, tomato, winter squash, zucchini.  Starchy Vegetables. Baked beans, kidney beans, lima beans, split peas, lentils, potatoes (with skin).  Grains. Whole wheat bread, brown rice, bran flake cereal, plain oatmeal, white rice, shredded wheat, bran muffins. SEEK IMMEDIATE MEDICAL CARE IF:   You develop increasing pain or severe bloating.  You have an oral temperature above 102 F (38.9 C), not controlled by medicine.  You develop vomiting or bowel movements that are bloody or black. Document Released: 03/06/2004 Document Revised: 09/01/2011 Document Reviewed: 11/07/2009 [facility] Patient Information 2014  [place], [place].  Hypothyroidism The thyroid is a large gland located in the lower front of your neck. The thyroid gland helps control metabolism. Metabolism is how your body handles food. It controls metabolism with the hormone thyroxine. When this gland is underactive (hypothyroid), it produces too little hormone.  CAUSES These include:   Absence or destruction of thyroid tissue.  Goiter due to iodine deficiency.  Goiter due to medications.  Congenital defects (since birth).  Problems with the pituitary. This causes a lack of TSH (thyroid stimulating hormone). This hormone tells the thyroid to turn out more hormone. SYMPTOMS  Lethargy (feeling as though you have no energy)  Cold intolerance  Weight gain (in spite of normal food intake)  Dry skin  Coarse hair  Menstrual irregularity (if severe, may lead to infertility)  Slowing of thought processes Cardiac problems are also caused by insufficient amounts of thyroid hormone. Hypothyroidism in the newborn is cretinism, and is an extreme form. It is important that this form be treated adequately and immediately or it will lead rapidly to retarded physical and mental development. DIAGNOSIS  To prove hypothyroidism, your caregiver may do blood tests and ultrasound tests. Sometimes the signs are hidden. It may be necessary for your caregiver to watch this illness with blood tests either before or after diagnosis and treatment. TREATMENT  Low levels of thyroid hormone are increased by using synthetic thyroid hormone. This is a safe, effective treatment. It usually takes about four weeks to gain the full effects of the medication. After you have the full effect of the medication, it will generally take another four weeks for problems to leave. Your caregiver may start you on low doses. If you have had heart problems the dose may be gradually increased. It is generally not an emergency  to get rapidly to normal. HOME CARE INSTRUCTIONS    Take your medications as your caregiver suggests. Let your caregiver know of any medications you are taking or start taking. Your caregiver will help you with dosage schedules.  As your condition improves, your dosage needs may increase. It will be necessary to have continuing blood tests as suggested by your caregiver.  Report all suspected medication side effects to your caregiver. SEEK MEDICAL CARE IF: Seek medical care if you develop:  Sweating.  Tremulousness (tremors).  Anxiety.  Rapid weight loss.  Heat intolerance.  Emotional swings.  Diarrhea.  Weakness. SEEK IMMEDIATE MEDICAL CARE IF:  You develop chest pain, an irregular heart beat (palpitations), or a rapid heart beat. MAKE SURE YOU:   Understand these instructions.  Will watch your condition.  Will get help right away if you are not doing well or get worse. Document Released: 06/09/2005 Document Revised: 09/01/2011 Document Reviewed: 01/28/2008 [facility] Patient Information 2014 [place], [place].   Chronic Obstructive Pulmonary Disease Chronic obstructive pulmonary disease (COPD) is a condition in which airflow from the lungs is restricted. The lungs can never return to normal, but there are measures you can take which will improve them and make you feel better. CAUSES   Smoking.  Exposure to secondhand smoke.  Breathing in irritants such as air pollution, dust, cigarette smoke, strong odors, aerosol sprays, or paint fumes.  History of lung infections. SYMPTOMS   Deep, persistent (chronic) cough with a large amount of thick mucus.  Wheezing.  Shortness of breath, especially with physical activity.  Feeling like you cannot get enough air.  Difficulty breathing.  Rapid breaths (tachypnea).  Gray or bluish discoloration (cyanosis) of the skin, especially in fingers, toes, or lips.  Fatigue.  Weight loss.  Swelling in legs, ankles, or feet.  Fast heartbeat (tachycardia).  Frequent lung  infections.   Chest tightness. DIAGNOSIS  Initial diagnosis may be based on your history, symptoms, and physical examination. Additional tests for COPD may include:  Chest X-ray.  Computed tomography (CT) scan.  Lung (pulmonary) function tests.  Blood tests. TREATMENT  Treatment focuses on making you comfortable (supportive care). Your caregiver may prescribe medicines (inhaled or pills) to help improve your breathing. Additional treatment options may include oxygen therapy and pulmonary rehabilitation. Treatment should also include reducing your exposure to known irritants and following a plan to stop smoking. HOME CARE INSTRUCTIONS   Take all medicines, including antibiotic medicines, as directed by your caregiver.  Use inhaled medicines as directed by your caregiver.  Avoid medicines or cough syrups that dry up your airway (antihistamines) and slow down the elimination of secretions. This decreases respiratory capacity and may lead to infections.  If you smoke, stop smoking.  Avoid exposure to smoke, chemicals, and fumes that aggravate your breathing.  Avoid contact with individuals that have a contagious illness.  Avoid extreme temperature and humidity changes.  Use humidifiers at home and at your bedside if they do not make breathing difficult.  Drink enough water and fluids to keep your urine clear or pale yellow. This loosens secretions.  Eat healthy foods. Eating smaller, more frequent meals and resting before meals may help you maintain your strength.  Ask your caregiver about the use of vitamins and mineral supplements.  Stay active. Exercise and physical activity will help maintain your ability to do things you want to do.  Balance activity with periods of rest.  Assume a position of comfort if you become short of breath.  Learn and use relaxation techniques.  Learn and use controlled breathing techniques as directed by your caregiver. Controlled breathing  techniques include:  Pursed lip breathing. This breathing technique starts with breathing in (inhaling) through your nose for 1 second. Next, purse your lips as if you were going to whistle. Then breathe out (exhale) through the pursed lips for 2 seconds.  Diaphragmatic breathing. Start by putting one hand on your abdomen just above your waist. Inhale slowly through your nose. The hand on your abdomen should move out. Then exhale slowly through pursed lips. You should be able to feel the hand on your abdomen moving in as you exhale.  Learn and use controlled coughing to clear mucus from your lungs. Controlled coughing is a series of short, progressive coughs. The steps of controlled coughing are: 1. Lean your head slightly forward. 2. Breathe in deeply using diaphragmatic breathing. 3. Try to hold your breath for 3 seconds. 4. Keep your mouth slightly open while coughing twice. 5. Spit any mucus out into a tissue. 6. Rest and repeat the steps once or twice as needed.  Receive all protective vaccines your caregiver suggests, especially pneumococcal and influenza vaccines.  Learn to manage stress.  Schedule and attend all follow-up appointments as directed by your caregiver. It is important to keep all your appointments.  Participate in pulmonary rehabilitation as directed by your caregiver.  Use home oxygen as suggested. SEEK MEDICAL CARE IF:   You are coughing up more mucus than usual.  There is a change in the color or thickness of the mucus.  Breathing is more labored than usual.  Your breathing is faster than usual.  Your skin color is more cyanotic than usual.  You are running out of the medicine you take for your breathing.  You are anxious, apprehensive, or restless.  You have a fever. SEEK IMMEDIATE MEDICAL CARE IF:   You have a rapid heart rate.  You have shortness of breath while you are resting.  You have shortness of breath that prevents you from being able to  talk.  You have shortness of breath that prevents you from performing your usual physical activities.  You have chest pain lasting longer than 5 minutes.  You have a seizure.  Your family or friends notice that you are agitated or confused. MAKE SURE YOU:   Understand these instructions.  Will watch your condition.  Will get help right away if you are not doing well or get worse. Document Released: 03/19/2005 Document Revised: 03/03/2012 Document Reviewed: 02/03/2013 [facility] Patient Information 2014 [place], [place].  Obesity Obesity is having too much body fat and a body mass index (BMI) of 30 or more. BMI is a number based on your height and weight. The number is an estimate of how much body fat you have. Obesity can happen if you eat more calories than you can burn by exercising or other activity. It can cause major health problems or emergencies.  HOME CARE  Exercise and be active as told by your doctor. Try:  Using stairs when you can.  Parking farther away from store doors.  Gardening, biking, or walking.  Eat healthy foods and drinks that are low in calories. Eat more fruits and vegetables.  Limit fast food, sweets, and snack foods that are made with ingredients that are not natural (processed food).  Eat smaller amounts of food.  Keep a journal and write down what you eat every day. Websites can help with this.  Avoid drinking  alcohol. Drink more water and drinks without calories.   Take vitamins and dietary pills (supplements) only as told by your doctor.  Try going to weight-loss support groups or classes to help lessen stress. Dieticians and counselors may also help. GET HELP RIGHT AWAY IF:  You have chest pain or tightness.  You have trouble breathing or feel short of breath.  You feel weak or have loss of feeling (numbness) in your legs.  You feel confused or have trouble talking.  You have sudden changes in your vision. MAKE SURE YOU:  Understand  these instructions.  Will watch your condition.  Will get help right away if you are not doing well or get worse. Document Released: 09/01/2011 Document Reviewed: 09/01/2011 [facility] Patient Information 2014 [place], [place].  Cholesterol Cholesterol is a white, waxy, fat-like protein needed by your body in small amounts. The liver makes all the cholesterol you need. It is carried from the liver by the blood through the blood vessels. Deposits (plaque) may build up on blood vessel walls. This makes the arteries narrower and stiffer. Plaque increases the risk for heart attack and stroke. You cannot feel your cholesterol level even if it is very high. The only way to know is by a blood test to check your lipid (fats) levels. Once you know your cholesterol levels, you should keep a record of the test results. Work with your caregiver to to keep your levels in the desired range. WHAT THE RESULTS MEAN:  Total cholesterol is a rough measure of all the cholesterol in your blood.  LDL is the so-called bad cholesterol. This is the type that deposits cholesterol in the walls of the arteries. You want this level to be low.  HDL is the good cholesterol because it cleans the arteries and carries the LDL away. You want this level to be high.  Triglycerides are fat that the body can either burn for energy or store. High levels are closely linked to heart disease. DESIRED LEVELS: 7. Total cholesterol below 200. 8. LDL below 100 for people at risk, below 70 for very high risk. 9. HDL above 50 is good, above 60 is best. 10. Triglycerides below 150. HOW TO LOWER YOUR CHOLESTEROL:  Diet.  Choose fish or white meat chicken and [place], roasted or baked. Limit fatty cuts of red meat, fried foods, and processed meats, such as sausage and lunch meat.  Eat lots of fresh fruits and vegetables. Choose whole grains, beans, pasta, potatoes and cereals.  Use only small amounts of olive, corn or canola oils. Avoid  butter, mayonnaise, shortening or palm kernel oils. Avoid foods with trans-fats.  Use skim/nonfat milk and low-fat/nonfat yogurt and cheeses. Avoid whole milk, cream, ice cream, egg yolks and cheeses. Healthy desserts include angel food cake, ginger snaps, animal crackers, hard candy, popsicles, and low-fat/nonfat frozen yogurt. Avoid pastries, cakes, pies and cookies.  Exercise.  A regular program helps decrease LDL and raises HDL.  Helps with weight control.  Do things that increase your activity level like gardening, walking, or taking the stairs.  Medication.  May be prescribed by your caregiver to help lowering cholesterol and the risk for heart disease.  You may need medicine even if your levels are normal if you have several risk factors. HOME CARE INSTRUCTIONS   Follow your diet and exercise programs as suggested by your caregiver.  Take medications as directed.  Have blood work done when your caregiver feels it is necessary. MAKE SURE YOU:   Understand  these instructions.  Will watch your condition.  Will get help right away if you are not doing well or get worse. Document Released: 03/04/2001 Document Revised: 09/01/2011 Document Reviewed: 08/25/2007 [facility] Patient Information 2014 [place], [place].  Diabetes and Exercise Exercising regularly is important. It is not just about losing weight. It has many health benefits, such as:  Improving your overall fitness, flexibility, and endurance.  Increasing your bone density.  Helping with weight control.  Decreasing your body fat.  Increasing your muscle strength.  Reducing stress and tension.  Improving your overall health. People with diabetes who exercise gain additional benefits because exercise:  Reduces appetite.  Improves the body's use of blood sugar (glucose).  Helps lower or control blood glucose.  Decreases blood pressure.  Helps control blood lipids (such as cholesterol and  triglycerides).  Improves the body's use of the hormone insulin by:  Increasing the body's insulin sensitivity.  Reducing the body's insulin needs.  Decreases the risk for heart disease because exercising:  Lowers cholesterol and triglycerides levels.  Increases the levels of good cholesterol (such as high-density lipoproteins [HDL]) in the body.  Lowers blood glucose levels. YOUR ACTIVITY PLAN  Choose an activity that you enjoy and set realistic goals. Your health care provider or diabetes educator can help you make an activity plan that works for you. You can break activities into 2 or 3 sessions throughout the day. Doing so is as good as one long session. Exercise ideas include:  Taking the dog for a walk.  Taking the stairs instead of the elevator.  Dancing to your favorite song.  Doing your favorite exercise with a friend. RECOMMENDATIONS FOR EXERCISING WITH TYPE 1 OR TYPE 2 DIABETES   Check your blood glucose before exercising. If blood glucose levels are greater than 240 mg/dL, check for urine ketones. Do not exercise if ketones are present.  Avoid injecting insulin into areas of the body that are going to be exercised. For example, avoid injecting insulin into:  The arms when playing tennis.  The legs when jogging.  Keep a record of:  Food intake before and after you exercise.  Expected peak times of insulin action.  Blood glucose levels before and after you exercise.  The type and amount of exercise you have done.  Review your records with your health care provider. Your health care provider will help you to develop guidelines for adjusting food intake and insulin amounts before and after exercising.  If you take insulin or oral hypoglycemic agents, watch for signs and symptoms of hypoglycemia. They include:  Dizziness.  Shaking.  Sweating.  Chills.  Confusion.  Drink plenty of water while you exercise to prevent dehydration or heat stroke. Body  water is lost during exercise and must be replaced.  Talk to your health care provider before starting an exercise program to make sure it is safe for you. Remember, almost any type of activity is better than none. Document Released: 08/30/2003 Document Revised: 02/09/2013 Document Reviewed: 11/16/2012 [facility] Patient Information 2014 [place], [place].  Vitamin D Deficiency Vitamin D is an important vitamin that your body needs. Having too little of it in your body is called a deficiency. A very bad deficiency can make your bones soft and can cause a condition called rickets.  Vitamin D is important to your body for different reasons, such as:   It helps your body absorb 2 minerals called calcium and phosphorus.  It helps make your bones healthy.  It may prevent some  diseases, such as diabetes and multiple sclerosis.  It helps your muscles and heart. You can get vitamin D in several ways. It is a natural part of some foods. The vitamin is also added to some dairy products and cereals. Some people take vitamin D supplements. Also, your body makes vitamin D when you are in the sun. It changes the sun's rays into a form of the vitamin that your body can use. CAUSES   Not eating enough foods that contain vitamin D.  Not getting enough sunlight.  Having certain digestive system diseases that make it hard to absorb vitamin D. These diseases include Crohn's disease, chronic pancreatitis, and cystic fibrosis.  Having a surgery in which part of the stomach or small intestine is removed.  Being obese. Fat cells pull vitamin D out of your blood. That means that obese people may not have enough vitamin D left in their blood and in other body tissues.  Having chronic kidney or liver disease. RISK FACTORS Risk factors are things that make you more likely to develop a vitamin D deficiency. They include:  Being older.  Not being able to get outside very much.  Living in a nursing  home.  Having had broken bones.  Having weak or thin bones (osteoporosis).  Having a disease or condition that changes how your body absorbs vitamin D.  Having dark skin.  Some medicines such as seizure medicines or steroids.  Being overweight or obese. SYMPTOMS Mild cases of vitamin D deficiency may not have any symptoms. If you have a very bad case, symptoms may include:  Bone pain.  Muscle pain.  Falling often.  Broken bones caused by a minor injury, due to osteoporosis. DIAGNOSIS A blood test is the best way to tell if you have a vitamin D deficiency. TREATMENT Vitamin D deficiency can be treated in different ways. Treatment for vitamin D deficiency depends on what is causing it. Options include:  Taking vitamin D supplements.  Taking a calcium supplement. Your caregiver will suggest what dose is best for you. HOME CARE INSTRUCTIONS  Take any supplements that your caregiver prescribes. Follow the directions carefully. Take only the suggested amount.  Have your blood tested 2 months after you start taking supplements.  Eat foods that contain vitamin D. Healthy choices include:  Fortified dairy products, cereals, or juices. Fortified means vitamin D has been added to the food. Check the label on the package to be sure.  Fatty fish like salmon or trout.  Eggs.  Oysters.  Do not use a tanning bed.  Keep your weight at a healthy level. Lose weight if you need to.  Keep all follow-up appointments. Your caregiver will need to perform blood tests to make sure your vitamin D deficiency is going away. SEEK MEDICAL CARE IF:  You have any questions about your treatment.  You continue to have symptoms of vitamin D deficiency.  You have nausea or vomiting.  You are constipated.  You feel confused.  You have severe abdominal or back pain. MAKE SURE YOU:  Understand these instructions.  Will watch your condition.  Will get help right away if you are not  doing well or get worse. Document Released: 09/01/2011 Document Revised: 10/04/2012 Document Reviewed: 09/01/2011 [facility] Patient Information 2014 [place], [place].

## 2013-06-13 NOTE — Progress Notes (Signed)
Patient ID: Richard Rosales, male   DOB: 31-Dec-1930, 79 y.o.   MRN: [id number]  Annual Screening Comprehensive Examination  This very nice 79 y.o.  MWmale presents for complete physical.  Patient has been followed for HTN, Prediabetes, COPD, Obesity, Recurrent Diverticular Bleeding, Hypothyroisism, Hyperlipidemia, and Vitamin D Deficiency.   HTN predates since 1998. Patient's BP has been controlled at home. Today's BP is blood pressure 120/66. Patient has hx/o chronic AFib since 2008 and has refused anticoagulant therapy with warfarin due to recurrent diverticular bleeding while on therapy. He did have cardiac cath in 2008 negative for significant CAD.   Patient denies any cardiac symptoms as chest pain, palpitations, shortness of breath, dizziness , but does have chronic ankle swelling.   Patient's hyperlipidemia is controlled with diet and medications. Patient denies myalgias or other medication SE's. Last cholesterol last visit was 153, triglycerides 121, HDL57 and LDL 72 Iin June.     Patient has prediabetes ( since Nov 2011 with A1c 6.1%) with last A1c 5.6% in June. Patient has lost 19# in the last year and 27# in the last 2 years. Patient denies reactive hypoglycemic symptoms, visual blurring, diabetic polys, or paresthesias.    Patient has COPD and is followed by Dr Solon Augusta at [facility]. He does report DOE related to age, weight and deconditioning.    Finally, patient has history of Vitamin D Deficiency with last vitamin D 49 in June (was 18 in 2008)     Current Outpatient Prescriptions on File Prior to Visit  Medication Sig Dispense Refill  . acetaminophen-codeine (TYLENOL #3) 300-30 MG per tablet Take 1 tablet by mouth every 4 (four) hours as needed.      Marland Kitchen aspirin 81 MG tablet Take 81 mg by mouth. 3 days per week      . azelastine (ASTELIN) 137 MCG/SPRAY nasal spray Place 1 spray into the nose at bedtime. Use in each nostril as directed      . azithromycin (ZITHROMAX) 250 MG  tablet 2 pills on day 1, then 1 pill daily for 4 days  6 tablet  0  . b complex vitamins tablet Take 1 tablet by mouth every morning.       . budesonide-formoterol (SYMBICORT) 160-4.5 MCG/ACT inhaler Inhale 2 puffs into the lungs 2 (two) times daily.  3 Inhaler  2  . Cholecalciferol (VITAMIN D) 2000 UNITS CAPS Take 2 capsules by mouth daily.       Marland Kitchen DILTIAZEM HCL ER PO Take by mouth. 240mg  tab take 1 tab twice a day      . docusate sodium (COLACE) 100 MG capsule Take 100 mg by mouth 2 (two) times daily.        . fish oil-omega-3 fatty acids 1000 MG capsule Take 1 g by mouth 2 (two) times daily.        . furosemide (LASIX) 40 MG tablet Take 40 mg by mouth daily.       . Glucosamine HCl 1000 MG TABS Take 1 tablet by mouth daily.        Marland Kitchen guaifenesin (HUMIBID E) 400 MG TABS Take 400 mg by mouth 2 (two) times daily.        Marland Kitchen HYDROcodone-homatropine (HYCODAN) 5-1.5 MG/5ML syrup Take 5 mLs by mouth every 6 (six) hours as needed for cough.  120 mL  0  . LEVOXYL 100 MCG tablet Take 100 mcg by mouth Every morning.      Marland Kitchen LOSARTAN POTASSIUM PO Take 1 tablet by mouth  daily.      . minoxidil (LONITEN) 2.5 MG tablet Take daily at bedtime  90 tablet  3  . omeprazole (PRILOSEC) 20 MG capsule Take 20 mg by mouth Twice daily before meals.      Marland Kitchen PROVIGIL 200 MG tablet Take 100 mg by mouth daily as needed. 1/2 tab by mouth on Wed and Sun when pt drives      . Psyllium (METAMUCIL) 28.3 % POWD 1 scoop twice daily       . spironolactone (ALDACTONE) 25 MG tablet Take 1 tablet (25 mg total) by mouth daily.  90 tablet  1  . tiotropium (SPIRIVA HANDIHALER) 18 MCG inhalation capsule Place 1 capsule (18 mcg total) into inhaler and inhale daily.  90 capsule  1   No current facility-administered medications on file prior to visit.    Allergies  Allergen Reactions  . Ace Inhibitors Cough  . Beta Adrenergic Blockers Other (See Comments)    bradycardia  . Fluticasone-Salmeterol     Unable to remember per pt  . Hytrin  [Terazosin]     Nasal congestion   . Prednisone     Shakes / tremors  . Vioxx [Rofecoxib] Other (See Comments)    dyspepsia    Past Medical History  Diagnosis Date  . HTN (hypertension)   . Diastolic dysfunction   . Atrial fibrillation   . GERD (gastroesophageal reflux disease)   . Allergic rhinitis   . Hypothyroidism   . DJD (degenerative joint disease)   . Diverticulosis of colon     recurrent GI bleeding  . OSA (obstructive sleep apnea)     compliants w/ CPAP  . Hyperplastic colon polyp 2003  . Chronic gastritis   . Positive PPD     remote  . Atrial flutter   . Acute bronchitis   . Anemia     iron deficient  . Shortness of breath   . COPD (chronic obstructive pulmonary disease)   . Venous insufficiency   . Moderate aortic insufficiency     Past Surgical History  Procedure Laterality Date  . Cardiac catheterization  7.27.05    revealed a preserved EF with no CAD  . Tonsillectomy    . Total knee arthroplasty      bilateral  . Nasal sinus surgery    . Colonoscopy    . Joint replacement  2001    lt total knee-rt one done 00  . Hernia repair      bilateral  . Hernia repair    . Transurethral resection of prostate      Family History  Problem Relation Age of Onset  . Hypertension Mother   . Diabetes Father   . Lung cancer Brother     chemical    History   Social History  . Marital Status: Married    Spouse Name: N/A    Number of Children: 4  . Years of Education: N/A   Occupational History  . part-time as a Education officer, environmental at [employer]    Social History Main Topics  . Smoking status: Former Smoker -- 1.00 packs/day for 20 years    Types: Cigarettes    Quit date: 08/01/1978  . Smokeless tobacco: Never Used  . Alcohol Use: Yes     Comment: about 3 to 4 times a week  . Drug Use: No  . Sexual Activity: Not on file   Other Topics Concern  . Not on file   Social History Narrative  Patient lives in 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    ROS Constitutional: Denies fever, chills, weight loss/gain, headaches, insomnia, fatigue, night sweats, and change in appetite. Eyes: Denies redness, blurred vision, diplopia, discharge, itchy, watery eyes.  ENT: Denies discharge, congestion, post nasal drip, epistaxis, sore throat, earache, hearing loss, dental pain, Tinnitus, Vertigo, Sinus pain, snoring.  Cardio: Denies chest pain, palpitations, irregular heartbeat, syncope, dyspnea, diaphoresis, orthopnea, PND, claudication, edema Respiratory: denies cough, dyspnea, DOE, pleurisy, hoarseness, laryngitis, wheezing.  Gastrointestinal: Denies dysphagia, heartburn, reflux, water brash, pain, cramps, nausea, vomiting, bloating, diarrhea, constipation, hematemesis, melena, hematochezia, jaundice, hemorrhoids Genitourinary: Denies dysuria, frequency, urgency, nocturia, hesitancy, discharge, hematuria, flank pain Musculoskeletal: Denies arthralgia, myalgia, stiffness, Jt. Swelling, pain, limp, and strain/sprain. Skin: Denies puritis, rash, hives, warts, acne, eczema, changing in skin lesion Neuro: No weakness, tremor, incoordination, spasms, paresthesia, pain Psychiatric: Denies confusion, memory loss, sensory loss Endocrine: Denies change in weight, skin, hair change, nocturia, and paresthesia, diabetic polys, visual blurring, hyper / hypo glycemic episodes.  Heme/Lymph: No excessive bleeding, bruising, or elarged lymph nodes.  There were no vitals filed for this visit.  Estimated body mass index is 40.30 kg/(m^2) as calculated from the following:   Height as of 05/25/13: 5\' 8"  (1.727 m).   Weight as of 05/25/13: 265 lb (120.203 kg).  Physical Exam General Appearance: Well nourished, in no apparent distress. Eyes: PERRLA, EOMs, conjunctiva no swelling or erythema, normal fundi and vessels. Sinuses: No frontal/maxillary tenderness ENT/Mouth: EACs patent / TMs   nl. Nares clear without erythema, swelling, mucoid exudates. Oral hygiene is good. No erythema, swelling, or exudate. Tongue normal, non-obstructing. Tonsils not swollen or erythematous. Hearing normal.  Neck: Supple, thyroid normal. No bruits, nodes or JVD. Respiratory: Respiratory effort normal.  BS equal and clear bilateral without rales, rhonci, wheezing or stridor. Cardio: Heart sounds are normal with regular rate and rhythm and no murmurs, rubs or gallops. Peripheral pulses are normal and equal bilaterally without edema. No aortic or femoral bruits. Chest: symmetric with normal excursions and percussion.  Abdomen: Flat, soft, with bowl sounds. Nontender, no guarding, rebound, hernias, masses, or organomegaly.  Lymphatics: Non tender without lymphadenopathy.  Genitourinary: No hernias.Testes nl. DRE - prostate nl for age - smooth & firm w/o nodules. Musculoskeletal: Full ROM all peripheral extremities, joint stability, 5/5 strength, and normal gait. Skin: Warm and dry without rashes, lesions, cyanosis, clubbing or  ecchymosis.  Neuro: Cranial nerves intact, reflexes equal bilaterally. Normal muscle tone, no cerebellar symptoms. Sensation intact.  Pysch: Awake and oriented X 3, normal affect, insight and judgment appropriate.   Assessment and Plan  1. Annual Screening Examination 2. Hypertension  3. Hyperlipidemia 4. COPD 5. Hypothyroidism 6. Morbid Obesity 7. Pre Diabetes 8. Vitamin D Deficiency  Continue prudent diet as discussed, weight control, BP monitoring, regular exercise, and medications as discussed.  Discussed med effects and SE's. Routine screening labs and tests as requested with regular follow-up as recommended.

## 2013-06-14 LAB — MICROALBUMIN / CREATININE URINE RATIO
Creatinine, Urine: 159.1 mg/dL
Microalb Creat Ratio: 7.7 mg/g (ref 0.0–30.0)
Microalb, Ur: 1.22 mg/dL (ref 0.00–1.89)

## 2013-06-14 LAB — INSULIN, FASTING: Insulin fasting, serum: 19 u[IU]/mL (ref 3–28)

## 2013-06-14 LAB — URINALYSIS, MICROSCOPIC ONLY: Squamous Epithelial / LPF: NONE SEEN

## 2013-06-28 ENCOUNTER — Other Ambulatory Visit (INDEPENDENT_AMBULATORY_CARE_PROVIDER_SITE_OTHER): Payer: Medicare Other | Admitting: Internal Medicine

## 2013-06-28 DIAGNOSIS — Z1212 Encounter for screening for malignant neoplasm of rectum: Secondary | ICD-10-CM

## 2013-06-28 LAB — POC HEMOCCULT BLD/STL (HOME/3-CARD/SCREEN)
Card #2 Fecal Occult Blod, POC: NEGATIVE
FECAL OCCULT BLD: NEGATIVE
Fecal Occult Blood, POC: NEGATIVE

## 2013-06-29 ENCOUNTER — Ambulatory Visit: Payer: Medicare Other | Admitting: Emergency Medicine

## 2013-07-03 ENCOUNTER — Other Ambulatory Visit: Payer: Self-pay | Admitting: Emergency Medicine

## 2013-07-08 ENCOUNTER — Ambulatory Visit (INDEPENDENT_AMBULATORY_CARE_PROVIDER_SITE_OTHER): Payer: Medicare Other | Admitting: Emergency Medicine

## 2013-07-08 ENCOUNTER — Encounter: Payer: Self-pay | Admitting: Emergency Medicine

## 2013-07-08 VITALS — BP 118/62 | HR 54 | Ht 68.5 in | Wt 270.0 lb

## 2013-07-08 DIAGNOSIS — J449 Chronic obstructive pulmonary disease, unspecified: Secondary | ICD-10-CM | POA: Diagnosis not present

## 2013-07-08 DIAGNOSIS — K219 Gastro-esophageal reflux disease without esophagitis: Secondary | ICD-10-CM | POA: Diagnosis not present

## 2013-07-08 DIAGNOSIS — G4733 Obstructive sleep apnea (adult) (pediatric): Secondary | ICD-10-CM

## 2013-07-08 DIAGNOSIS — J4489 Other specified chronic obstructive pulmonary disease: Secondary | ICD-10-CM

## 2013-07-08 NOTE — Assessment & Plan Note (Signed)
-   continue current BD regimen

## 2013-07-08 NOTE — Assessment & Plan Note (Signed)
Tolerates CPAP - consider a repeat titration study if fatigue continues

## 2013-07-08 NOTE — Patient Instructions (Signed)
Please increase your omeprazole back to twice a day Continue your other medications as you have been taking them  Follow with Dr Lamonte Sakai in 2 months or sooner if you have any problems.

## 2013-07-08 NOTE — Assessment & Plan Note (Signed)
-   agree with increasing PPI back to BID. He seems to be coughing and wheezing more since he went to qd

## 2013-07-08 NOTE — Progress Notes (Signed)
Subjective:  Patient ID: Richard Rosales, male    DOB: [birth year], 80 y.o.   MRN: [id number] HPI PCP- [facility]  80 yo man, former smoker(quit'80)  For FU of COPD &  OSA on CPAP 12 cm. He has  hx of HTN, A Fib, chronic diastolic dysfxn and LE edema, GERD, diverticulosis, remote positive PPD.   TTE with preserved LVEF, PASP 34, not able to comment on diastolic fxn. PFTs 5/11  Showed moderate airway obstruction with FEv1 55% & good BD respnse - improved to 67%  09/15/2011 Follow up  Patient returns for a two-week followup visit. Last visit had a COPD exacerbation treated with antibiotics and steroids. Patient has had 2 COPD flares over last couple months. He feels that he is back to his baseline. He denies any chest pain, orthopnea, PND, increased leg swelling, hemoptysis, or fever. Currently taking Symbicort 2 puffs twice daily.  ROV 11/13/11 -- Hx OSA, COPD on Symbicort. Also A flutter, chronic diastolic CHF. He saw TP in March and Spiriva was added to Symbicort, he believes it has helped. He has had coughing for the last 3 weeks, one day had nausea. He has had a lot of mucous and drainage. Still using fluticasone nasal spray, loratadine. Not using [place] right now.   ROV 02/16/12 -- Hx OSA, COPD on Symbicort. Also A flutter, chronic diastolic CHF.  He is off the loratadine and fluticasone, he is using chlorpheniramine q6h. He continues to have paroxysms. He has been on allergy shots in the past, not recently. He is interested in going to Dr Orvil Feil. He tells me that breathing is overall the same. He is still limited in his exertion. Still on Symbicort and spiriva, no pred or abx since last time.  Reliable with CPAP.   ROV 03/22/12 -- Hx OSA, COPD on Symbicort and Spiriva. Also A flutter, chronic diastolic CHF.  He is having more mucous and cough. Reassuring eval by Dr Orvil Feil, better drainage with astelin/fluticasone. He is wondering if Symbicort is bothering his cough.   ROV 08/26/12 -- Hx  OSA, COPD on Symbicort and Spiriva. Also A flutter, chronic diastolic CHF. Chronic rhinitis, has been seen by Dr Orvil Feil. Having cough, nasal congestion and drainage. He is doing [place] bid, no longer on nasal steroid or astelin. He uses chlortrimeton, not currently on loratadine or any anti-histamine. Functional capacity limited by his arthritis. Never uses SABA.   ROV 12/02/12 -- OSA, COPD on Symbicort and Spiriva. Also A flutter, chronic diastolic CHF. Chronic rhinitis, doing NSW's. He restarted fluticasone and allegra. Not currently doing chlortrimeton. No AE's since last time. He has a SABA, has never needed to use. Good compliance w CPAP. No fevers.   Acute OV  Complains of increased SOB, prod cough with clear mucus, some wheezing, throat congestion, head congestion w/ PND  - worse x3 weeks.  denies f/c/s, increased edema. No otc used.  No fever, orthopnea, chest pain, n/v.  Wears CPAP each night.   ROV 03/30/13 -- OSA, COPD on Symbicort and Spiriva. Also A flutter, chronic diastolic CHF. He saw Dr Gwenette Greet 02/25/13 for persistent sx after a COPD flare.  It is very difficult for him to take corticosteroids, gets side effects of tremor, aches, mood swings, etc. He changed allegra to chlorpheniramine, seems to be working better. He is sleeping better, has some wheeze that seems to be UA. His GERD is treated.   ROV 07/08/13 -- OSA, COPD on Symbicort and Spiriva. Also A flutter, chronic  diastolic CHF.  Was seen by VS in 12/14 for an AE-COPD. He is having fatigue, tiredness with heavier exertion. He is still having some cough, nasal drainage. He cut his omeprazole to once a day about 1.5 months ago. Just increased back to bid today.     Filed Vitals:   07/08/13 1156  BP: 118/62  Pulse: 54  Height: 5' 8.5" (1.74 m)  Weight: 270 lb (122.471 kg)  SpO2: 96%    Exam:  GEN: A/Ox3; pleasant , NAD,  obese   HEENT:  [place]/AT,  EACs-clear, TMs-wnl, NOSE-clear drainage , THROAT-clear, no lesions, no postnasal  drip or exudate noted.   NECK:  Supple w/ fair ROM; no JVD; normal carotid impulses w/o bruits; no thyromegaly or nodules palpated; no lymphadenopathy.  RESP Few exp wheezes   CARD:  RRR, no m/r/g  , 1+ peripheral edema, pulses intact, no cyanosis or clubbing.  Musco: Warm bil, no deformities or joint swelling noted.   Neuro: alert, no focal deficits noted.    Skin: Warm, no lesions or rashes     Assessment & Plan:   SLEEP APNEA, OBSTRUCTIVE Tolerates CPAP - consider a repeat titration study if fatigue continues  COPD - continue current BD regimen  GERD - agree with increasing PPI back to BID. He seems to be coughing and wheezing more since he went to qd

## 2013-07-19 ENCOUNTER — Ambulatory Visit (INDEPENDENT_AMBULATORY_CARE_PROVIDER_SITE_OTHER): Payer: Medicare Other

## 2013-07-19 VITALS — BP 143/85 | HR 66 | Resp 16

## 2013-07-19 DIAGNOSIS — M79609 Pain in unspecified limb: Secondary | ICD-10-CM | POA: Diagnosis not present

## 2013-07-19 DIAGNOSIS — B351 Tinea unguium: Secondary | ICD-10-CM | POA: Diagnosis not present

## 2013-07-19 NOTE — Progress Notes (Signed)
   Subjective:    Patient ID: Richard Rosales, male    DOB: 12-07-1930, 80 y.o.   MRN: [id number]  HPI patient presents for foot and nail care    Review of Systems no new changes or findings     Objective:   Physical Exam Masker status is intact with pedal pulses palpable DP postal for PT plus one over 4 bilateral there is +1 edema noted mild varicosities noted neurologically epicritic and proprioceptive sensations diminished on Semmes Weinstein testing normal plantar response DTRs not listed neurologically skin color pigment normal hair growth absent nails thick friable discolored tender for tender and ingrowing second through fifth bilateral both on palpation and debridement and with enclosed shoe wear. Orthopedic biomechanical exam rectus foot type mild flexible contractures noted no new changes no open wounds or ulcerations noted       Assessment & Plan:  Assessment this time is onychomycosis painful mycotic nails dystrophic friable brittle debrided 1 through 5 bilateral she 2 through 5 bilateral painful mycotic nails debrided return in 3 months for continued palliative nail care and as-needed basis in the future.  Harriet Masson DPM

## 2013-07-19 NOTE — Patient Instructions (Signed)

## 2013-08-27 ENCOUNTER — Other Ambulatory Visit: Payer: Self-pay | Admitting: Internal Medicine

## 2013-09-06 ENCOUNTER — Ambulatory Visit (INDEPENDENT_AMBULATORY_CARE_PROVIDER_SITE_OTHER): Payer: Medicare Other | Admitting: Emergency Medicine

## 2013-09-06 ENCOUNTER — Encounter: Payer: Self-pay | Admitting: Emergency Medicine

## 2013-09-06 VITALS — BP 130/84 | HR 96 | Ht 68.5 in | Wt 272.0 lb

## 2013-09-06 DIAGNOSIS — G4733 Obstructive sleep apnea (adult) (pediatric): Secondary | ICD-10-CM | POA: Diagnosis not present

## 2013-09-06 DIAGNOSIS — J449 Chronic obstructive pulmonary disease, unspecified: Secondary | ICD-10-CM | POA: Diagnosis not present

## 2013-09-06 MED ORDER — ALBUTEROL SULFATE HFA 108 (90 BASE) MCG/ACT IN AERS: 1.0000 | INHALATION_SPRAY | Freq: Four times a day (QID) | RESPIRATORY_TRACT | Status: DC | PRN

## 2013-09-06 NOTE — Assessment & Plan Note (Signed)
Good compliance w CPAP 

## 2013-09-06 NOTE — Assessment & Plan Note (Signed)
-   continue same BD's - encouraged him to try SABA prn

## 2013-09-06 NOTE — Patient Instructions (Signed)
Please continue your inhaled medications as you have been taking them Continue your your CPAP every night Follow with Dr Lamonte Sakai in 4 months or sooner if you have any problems.

## 2013-09-06 NOTE — Progress Notes (Signed)
Subjective:  Patient ID: Richard Rosales, male    DOB: 06-19-1931, 80 y.o.   MRN: [id number]  81 yo man, former smoker(quit'80)  For FU of COPD &  OSA on CPAP 12 cm. He has  hx of HTN, A Fib, chronic diastolic dysfxn and LE edema, GERD, diverticulosis, remote positive PPD.   TTE with preserved LVEF, PASP 34, not able to comment on diastolic fxn. PFTs 5/11  Showed moderate airway obstruction with FEv1 55% & good BD respnse - improved to 67%  09/15/2011 Follow up  Patient returns for a two-week followup visit. Last visit had a COPD exacerbation treated with antibiotics and steroids. Patient has had 2 COPD flares over last couple months. He feels that he is back to his baseline. He denies any chest pain, orthopnea, PND, increased leg swelling, hemoptysis, or fever. Currently taking Symbicort 2 puffs twice daily.  ROV 11/13/11 -- Hx OSA, COPD on Symbicort. Also A flutter, chronic diastolic CHF. He saw TP in March and Spiriva was added to Symbicort, he believes it has helped. He has had coughing for the last 3 weeks, one day had nausea. He has had a lot of mucous and drainage. Still using fluticasone nasal spray, loratadine. Not using [place] right now.   ROV 02/16/12 -- Hx OSA, COPD on Symbicort. Also A flutter, chronic diastolic CHF.  He is off the loratadine and fluticasone, he is using chlorpheniramine q6h. He continues to have paroxysms. He has been on allergy shots in the past, not recently. He is interested in going to Dr Orvil Feil. He tells me that breathing is overall the same. He is still limited in his exertion. Still on Symbicort and spiriva, no pred or abx since last time.  Reliable with CPAP.   ROV 03/22/12 -- Hx OSA, COPD on Symbicort and Spiriva. Also A flutter, chronic diastolic CHF.  He is having more mucous and cough. Reassuring eval by Dr Orvil Feil, better drainage with astelin/fluticasone. He is wondering if Symbicort is bothering his cough.   ROV 08/26/12 -- Hx OSA, COPD on Symbicort and Spiriva.  Also A flutter, chronic diastolic CHF. Chronic rhinitis, has been seen by Dr Orvil Feil. Having cough, nasal congestion and drainage. He is doing [place] bid, no longer on nasal steroid or astelin. He uses chlortrimeton, not currently on loratadine or any anti-histamine. Functional capacity limited by his arthritis. Never uses SABA.   ROV 12/02/12 -- OSA, COPD on Symbicort and Spiriva. Also A flutter, chronic diastolic CHF. Chronic rhinitis, doing [place]'s. He restarted fluticasone and allegra. Not currently doing chlortrimeton. No AE's since last time. He has a SABA, has never needed to use. Good compliance w CPAP. No fevers.   Acute OV  Complains of increased SOB, prod cough with clear mucus, some wheezing, throat congestion, head congestion w/ PND  - worse x3 weeks.  denies f/c/s, increased edema. No otc used.  No fever, orthopnea, chest pain, n/v.  Wears CPAP each night.   ROV 03/30/13 -- OSA, COPD on Symbicort and Spiriva. Also A flutter, chronic diastolic CHF. He saw Dr Gwenette Greet 02/25/13 for persistent sx after a COPD flare.  It is very difficult for him to take corticosteroids, gets side effects of tremor, aches, mood swings, etc. He changed allegra to chlorpheniramine, seems to be working better. He is sleeping better, has some wheeze that seems to be UA. His GERD is treated.   ROV 07/08/13 -- OSA, COPD on Symbicort and Spiriva. Also A flutter, chronic diastolic CHF.  Was seen by  VS in 12/14 for an AE-COPD. He is having fatigue, tiredness with heavier exertion. He is still having some cough, nasal drainage. He cut his omeprazole to once a day about 1.5 months ago. Just increased back to bid today.   ROV 09/06/13 -- Hx of OSA, COPD, A flutter, chronic diastolic CHF. He is on CPAP, uses Spiriva and Symbicort. He is SOB with exertion. He has not needed his SABA.  No flares. Able to wear CPAP every night. Has an am cough, usually clear.     Filed Vitals:   09/06/13 1435  BP: 130/84  Pulse: 96  Height: 5'  8.5" (1.74 m)  Weight: 272 lb (123.378 kg)  SpO2: 93%    Exam:  GEN: A/Ox3; pleasant , NAD,  obese kyphotic.   HEENT:  [place]/AT,  EACs-clear, TMs-wnl, NOSE-clear drainage , THROAT-clear, no lesions, no postnasal drip or exudate noted.   NECK:  Supple w/ fair ROM; no JVD; normal carotid impulses w/o bruits; no thyromegaly or nodules palpated; no lymphadenopathy.  RESP Few exp wheezes B  CARD:  RRR, no m/r/g  , 1+ peripheral edema, pulses intact, no cyanosis or clubbing.  Musco: Warm bil, no deformities or joint swelling noted.   Neuro: alert, no focal deficits noted.    Skin: Warm, no lesions or rashes     Assessment & Plan:   SLEEP APNEA, OBSTRUCTIVE Good compliance w CPAP.   COPD - continue same BD's - encouraged him to try SABA prn

## 2013-09-06 NOTE — Addendum Note (Signed)
Addended by: Carlos American A on: 09/06/2013 03:13 PM   Modules accepted: Orders

## 2013-09-10 DIAGNOSIS — M1990 Unspecified osteoarthritis, unspecified site: Secondary | ICD-10-CM | POA: Insufficient documentation

## 2013-09-10 DIAGNOSIS — E039 Hypothyroidism, unspecified: Secondary | ICD-10-CM | POA: Insufficient documentation

## 2013-09-10 DIAGNOSIS — J309 Allergic rhinitis, unspecified: Secondary | ICD-10-CM | POA: Insufficient documentation

## 2013-09-14 ENCOUNTER — Ambulatory Visit (INDEPENDENT_AMBULATORY_CARE_PROVIDER_SITE_OTHER): Payer: Medicare Other | Admitting: Physician Assistant

## 2013-09-14 ENCOUNTER — Encounter: Payer: Self-pay | Admitting: Physician Assistant

## 2013-09-14 VITALS — BP 138/68 | HR 72 | Temp 98.1°F | Resp 16 | Wt 271.0 lb

## 2013-09-14 DIAGNOSIS — E039 Hypothyroidism, unspecified: Secondary | ICD-10-CM

## 2013-09-14 DIAGNOSIS — R7303 Prediabetes: Secondary | ICD-10-CM

## 2013-09-14 DIAGNOSIS — Z1331 Encounter for screening for depression: Secondary | ICD-10-CM

## 2013-09-14 DIAGNOSIS — F3289 Other specified depressive episodes: Secondary | ICD-10-CM | POA: Diagnosis not present

## 2013-09-14 DIAGNOSIS — I4891 Unspecified atrial fibrillation: Secondary | ICD-10-CM

## 2013-09-14 DIAGNOSIS — Z0000 Encounter for general adult medical examination without abnormal findings: Secondary | ICD-10-CM | POA: Diagnosis not present

## 2013-09-14 DIAGNOSIS — R7309 Other abnormal glucose: Secondary | ICD-10-CM | POA: Diagnosis not present

## 2013-09-14 DIAGNOSIS — Z23 Encounter for immunization: Secondary | ICD-10-CM

## 2013-09-14 DIAGNOSIS — J449 Chronic obstructive pulmonary disease, unspecified: Secondary | ICD-10-CM

## 2013-09-14 DIAGNOSIS — I10 Essential (primary) hypertension: Secondary | ICD-10-CM | POA: Diagnosis not present

## 2013-09-14 DIAGNOSIS — F329 Major depressive disorder, single episode, unspecified: Secondary | ICD-10-CM | POA: Diagnosis not present

## 2013-09-14 DIAGNOSIS — Z79899 Other long term (current) drug therapy: Secondary | ICD-10-CM | POA: Diagnosis not present

## 2013-09-14 LAB — CBC WITH DIFFERENTIAL/PLATELET
Basophils Absolute: 0.1 10*3/uL (ref 0.0–0.1)
Basophils Relative: 1 % (ref 0–1)
Eosinophils Absolute: 0.5 10*3/uL (ref 0.0–0.7)
Eosinophils Relative: 7 % — ABNORMAL HIGH (ref 0–5)
HEMATOCRIT: 41.6 % (ref 39.0–52.0)
HEMOGLOBIN: 13.9 g/dL (ref 13.0–17.0)
Lymphocytes Relative: 20 % (ref 12–46)
Lymphs Abs: 1.6 10*3/uL (ref 0.7–4.0)
MCH: 30.3 pg (ref 26.0–34.0)
MCHC: 33.4 g/dL (ref 30.0–36.0)
MCV: 90.6 fL (ref 78.0–100.0)
MONO ABS: 0.8 10*3/uL (ref 0.1–1.0)
MONOS PCT: 10 % (ref 3–12)
NEUTROS ABS: 4.8 10*3/uL (ref 1.7–7.7)
Neutrophils Relative %: 62 % (ref 43–77)
Platelets: 191 10*3/uL (ref 150–400)
RBC: 4.59 MIL/uL (ref 4.22–5.81)
RDW: 13.2 % (ref 11.5–15.5)
WBC: 7.8 10*3/uL (ref 4.0–10.5)

## 2013-09-14 LAB — HEMOGLOBIN A1C
HEMOGLOBIN A1C: 5.9 % — AB (ref <5.7)
MEAN PLASMA GLUCOSE: 123 mg/dL — AB (ref <117)

## 2013-09-14 NOTE — Patient Instructions (Signed)

## 2013-09-14 NOTE — Progress Notes (Signed)
Subjective:  Richard Rosales is a 80 y.o. male who presents for Medicare Annual Wellness Visit and 3 month follow up for HTN, hyperlipidemia, prediabetes, and vitamin D Def.  Date of last medicare wellness visit was is unknown.  His blood pressure has been controlled at home, today their BP is BP: 138/68 mmHg He does workout, he states with walking 10 yards he has SOB but he can ride his stationary bike 20 mins daily. He denies chest pain, shortness of breath, dizziness.  He is not on cholesterol medication and denies myalgias. His cholesterol is at goal. The cholesterol last visit was:   Lab Results  Component Value Date   CHOL 158 06/13/2013   HDL 45 06/13/2013   LDLCALC 92 06/13/2013   TRIG 105 06/13/2013   CHOLHDL 3.5 06/13/2013   He has been working on diet and exercise for prediabetes, and denies hypoglycemia , paresthesia of the feet, polydipsia, polyuria and visual disturbances. Last A1C in the office was:  Lab Results  Component Value Date   HGBA1C 5.7* 06/13/2013   Patient is on Vitamin D supplement.    Names of Other Physician/Practitioners you currently use: 1. [facility] here for primary care 2. Dr. Katy Fitch, eye doctor, last visit yearly, uncertain of time 3. Dr. Felipa Eth, dentist, last visit yearly Patient Care Team: Unk Pinto, MD as PCP - General (Internal Medicine) Collene Gobble, MD as Consulting Physician (Pulmonary Disease) Inda Castle, MD as Consulting Physician (Gastroenterology) Thompson Grayer, MD as Consulting Physician (Cardiology)  Medication Review: Current Outpatient Prescriptions on File Prior to Visit  Medication Sig Dispense Refill  . acetaminophen-codeine (TYLENOL #3) 300-30 MG per tablet Take 1 tablet by mouth every 4 (four) hours as needed.      Marland Kitchen albuterol (PROAIR HFA) 108 (90 BASE) MCG/ACT inhaler Inhale 1 puff into the lungs every 6 (six) hours as needed for wheezing or shortness of breath.  1 Inhaler   3  . aspirin 81 MG tablet Take 81 mg by mouth. 3 days per week      . azelastine (ASTELIN) 137 MCG/SPRAY nasal spray Place 1 spray into the nose at bedtime. Use in each nostril as directed      . b complex vitamins tablet Take 1 tablet by mouth every morning.       . budesonide-formoterol (SYMBICORT) 160-4.5 MCG/ACT inhaler Inhale 2 puffs into the lungs 2 (two) times daily.  3 Inhaler  2  . Cholecalciferol (VITAMIN D) 2000 UNITS CAPS Take 2 capsules by mouth daily.       Marland Kitchen diltiazem (DILACOR XR) 240 MG 24 hr capsule TAKE 1 CAPSULE TWO TIMES A DAY  180 capsule  0  . docusate sodium (COLACE) 100 MG capsule Take 100 mg by mouth 2 (two) times daily.        . fish oil-omega-3 fatty acids 1000 MG capsule Take 1 g by mouth 2 (two) times daily.        . furosemide (LASIX) 40 MG tablet Take 40 mg by mouth daily.       . Glucosamine HCl 1000 MG TABS Take 1 tablet by mouth daily.        Marland Kitchen guaifenesin (HUMIBID E) 400 MG TABS Take 400 mg by mouth 2 (two) times daily.        Marland Kitchen LEVOXYL 100 MCG tablet Take 100 mcg by mouth Every morning.      Marland Kitchen LOSARTAN POTASSIUM PO Take 1 tablet by mouth daily.      Marland Kitchen  minoxidil (LONITEN) 2.5 MG tablet Take daily at bedtime  90 tablet  3  . omeprazole (PRILOSEC) 20 MG capsule Take 40 mg by mouth Twice daily before meals.       Marland Kitchen PROVIGIL 200 MG tablet Take 100 mg by mouth daily as needed. 1/2 tab by mouth on Wed and Sun when pt drives      . Psyllium (METAMUCIL) 28.3 % POWD 1 scoop twice daily       . SPIRIVA HANDIHALER 18 MCG inhalation capsule INHALE THE CONTENTS OF 1 CAPSULE ONCE DAILY AS  DIRECTED IN THE PACKAGE  1 capsule  0   No current facility-administered medications on file prior to visit.    Current Problems (verified) Patient Active Problem List   Diagnosis Date Noted  . DJD (degenerative joint disease)   . Allergic rhinitis   . Hypothyroidism   . Obesity, morbid 06/13/2013  . Rotator cuff tear, right 05/08/2011  . GI bleed 10/25/2010  . COPD 11/01/2009   . SLEEP APNEA, OBSTRUCTIVE 10/04/2009  . Chronic diastolic heart failure 0000000  . ALLERGIC RHINITIS 10/04/2009  . DIVERTICULOSIS OF COLON WITH HEMORRHAGE 09/10/2009  . PERSONAL HISTORY OF COLONIC POLYPS 09/10/2009  . Atrial flutter 04/19/2009  . Atrial fibrillation 09/11/2008  . HYPERTENSION, UNSPECIFIED 09/08/2008  . GERD 09/08/2008  . BENIGN PROSTATIC HYPERTROPHY, HX OF 09/08/2008    Screening Tests Health Maintenance  Topic Date Due  . Colonoscopy  11/19/1982  . Zostavax  11/18/1992  . Tetanus/tdap  06/23/2013  . Influenza Vaccine  01/21/2014  . Pneumococcal Polysaccharide Vaccine Age 52 And Over  Completed    Immunization History  Administered Date(s) Administered  . DTaP 06/24/2003  . Influenza Split 04/24/2011, 03/23/2012, 03/10/2013  . Influenza Whole 03/23/2009, 03/23/2010  . Pneumococcal Polysaccharide-23 05/23/1994, 11/22/2001, 05/28/2010  . Tdap 06/24/2003    Preventative care: Last colonoscopy: 2010 Dr. Deatra Ina Prior vaccinations: TD or Tdap: 2005  Influenza: 02/2013 Pneumococcal: 2011 Shingles/Zostavax: suggest  History reviewed: allergies, current medications, past family history, past medical history, past social history, past surgical history and problem list   Risk Factors: Tobacco History  Substance Use Topics  . Smoking status: Former Smoker -- 1.00 packs/day for 20 years    Types: Cigarettes    Quit date: 08/01/1978  . Smokeless tobacco: Never Used  . Alcohol Use: Yes     Comment: about 3 to 4 times a week   He does not smoke.  Patient is a former smoker. Are there smokers in your home (other than you)?  No  Alcohol Current alcohol use: social drinker  Caffeine Current caffeine use: coffee 1 /day  Exercise Current exercise habits: Exercise is limited by respiratory condition(s): COPD.  Current exercise: bicycling  Nutrition/Diet Current diet: in general, a "healthy" diet    Cardiac risk factors: advanced age (older than 77  for men, 64 for women), dyslipidemia, family history of premature cardiovascular disease, hypertension, male gender, obesity (BMI >= 30 kg/m2) and sedentary lifestyle.  Depression Screen Nurse depression screen reviewed.  (Note: if answer to either of the following is "Yes", a more complete depression screening is indicated)   Q1: Over the past two weeks, have you felt down, depressed or hopeless? No  Q2: Over the past two weeks, have you felt little interest or pleasure in doing things? No  Have you lost interest or pleasure in daily life? No  Do you often feel hopeless? No  Do you cry easily over simple problems? No  Activities of Daily  Living Nurse ADLs screen reviewed.  In your present state of health, do you have any difficulty performing the following activities?:  Driving? No Managing money?  No Feeding yourself? No Getting from bed to chair? No Climbing a flight of stairs? Yes Preparing food and eating?: No Bathing or showering? No Getting dressed: No Getting to the toilet? No Using the toilet:No Moving around from place to place: Yes, moving in and out of the car and walks slowly In the past year have you fallen or had a near fall?:No   Are you sexually active?  No  Do you have more than one partner?  No  Vision Difficulties: No  Hearing Difficulties: Yes Do you often ask people to speak up or repeat themselves? Yes Do you experience ringing or noises in your ears? No Do you have difficulty understanding soft or whispered voices? Yes  Cognition  Do you feel that you have a problem with memory?No  Do you often misplace items? No  Do you feel safe at home?  Yes  Advanced directives Does patient have a [place]? Yes Does patient have a Living Will? Yes   Objective:     Vision and hearing screens reviewed.   Blood pressure 138/68, pulse 72, temperature 98.1 F (36.7 C), resp. rate 16, weight 271 lb (122.925 kg). Body mass index is 40.6  kg/(m^2).  General appearance: alert, no distress, WD/WN, male Cognitive Testing  Alert? Yes  Normal Appearance?Yes  Oriented to person? Yes  Place? Yes   Time? Yes  Recall of three objects?  Yes  Can perform simple calculations? Yes  Displays appropriate judgment?Yes  Can read the correct time from a watch face?Yes  HEENT: normocephalic, sclerae anicteric, TMs pearly, nares patent, no discharge or erythema, pharynx normal Oral cavity: MMM, no lesions Neck: supple, no lymphadenopathy, no thyromegaly, no masses Heart: RRR, normal S1, S2, no murmurs Lungs: CTA bilaterally, no wheezes, rhonchi, or rales Abdomen: +bs, soft, obese non tender, non distended, no masses, no hepatomegaly, no splenomegaly Musculoskeletal: nontender, no swelling, no obvious deformity Extremities: 2-3 + edema with compression stockings,, no cyanosis, no clubbing Pulses: 2+ symmetric, upper and lower extremities, normal cap refill Neurological: alert, oriented x 3, CN2-12 intact, strength normal upper extremities and lower extremities, sensation normal throughout, DTRs 2+ throughout, no cerebellar signs, gait with cane.  Psychiatric: normal affect, behavior normal, pleasant   Assessment:   1. HYPERTENSION, UNSPECIFIED - CBC with Differential - BASIC METABOLIC PANEL WITH GFR - Hepatic function panel  2. Atrial fibrillation Continue to follow cardio  3. COPD Continue to follow respiratory Continue CPAP and can use SABA more, compliant with therapy  4. Hypothyroidism Continue medication - TSH  5. Obesity, morbid - long discussion about weight loss, diet, and exercise - Lipid panel  6. Encounter for long-term (current) use of other medications - Magnesium  7. Prediabetes - Hemoglobin A1c   Plan:   During the course of the visit the patient was educated and counseled about appropriate screening and preventive services including:    Influenza vaccine  Screening electrocardiogram  Diabetes  screening  Glaucoma screening  Nutrition counseling   Screening recommendations, referrals: Vaccinations: Tdap vaccine no  Influenza vaccine yes Pneumococcal vaccine no Shingles vaccine yes Hep B vaccine no  Nutrition assessed and recommended  Colonoscopy no Recommended yearly ophthalmology/optometry visit for glaucoma screening and checkup Recommended yearly dental visit for hygiene and checkup Advanced directives - yes  Conditions/risks identified: BMI: Discussed weight loss,  diet, and increase physical activity.  Increase physical activity: AHA recommends 150 minutes of physical activity a week.  Medications reviewed Diabetes is at goal, ACE/ARB therapy: Yes. Urinary Incontinence is an issue: discussed non pharmacology and pharmacology options.  Fall risk: high- discussed PT, home fall assessment, medications.  COPD- follows closely with Dr.Byrum, PFTs with him  Medicare Attestation I have personally reviewed: The patient's medical and social history Their use of alcohol, tobacco or illicit drugs Their current medications and supplements The patient's functional ability including ADLs,fall risks, home safety risks, cognitive, and hearing and visual impairment Diet and physical activities Evidence for depression or mood disorders  The patient's weight, height, BMI, and visual acuity have been recorded in the chart.  I have made referrals, counseling, and provided education to the patient based on review of the above and I have provided the patient with a written personalized care plan for preventive services.     Vicie Mutters, PA-C   09/14/2013

## 2013-09-15 LAB — HEPATIC FUNCTION PANEL
ALK PHOS: 81 U/L (ref 39–117)
ALT: 20 U/L (ref 0–53)
AST: 25 U/L (ref 0–37)
Albumin: 4.1 g/dL (ref 3.5–5.2)
BILIRUBIN DIRECT: 0.1 mg/dL (ref 0.0–0.3)
BILIRUBIN INDIRECT: 0.4 mg/dL (ref 0.2–1.2)
TOTAL PROTEIN: 6.3 g/dL (ref 6.0–8.3)
Total Bilirubin: 0.5 mg/dL (ref 0.2–1.2)

## 2013-09-15 LAB — BASIC METABOLIC PANEL WITH GFR
BUN: 13 mg/dL (ref 6–23)
CHLORIDE: 101 meq/L (ref 96–112)
CO2: 30 mEq/L (ref 19–32)
Calcium: 9.2 mg/dL (ref 8.4–10.5)
Creat: 0.75 mg/dL (ref 0.50–1.35)
GFR, EST NON AFRICAN AMERICAN: 87 mL/min
GFR, Est African American: >89 mL/min
Glucose, Bld: 103 mg/dL — ABNORMAL HIGH (ref 70–99)
POTASSIUM: 4.4 meq/L (ref 3.5–5.3)
Sodium: 141 mEq/L (ref 135–145)

## 2013-09-15 LAB — LIPID PANEL
CHOL/HDL RATIO: 2.7 ratio
Cholesterol: 125 mg/dL (ref 0–200)
HDL: 46 mg/dL (ref >39)
LDL CALC: 64 mg/dL (ref 0–99)
Triglycerides: 76 mg/dL (ref <150)
VLDL: 15 mg/dL (ref 0–40)

## 2013-09-15 LAB — MAGNESIUM: Magnesium: 1.9 mg/dL (ref 1.5–2.5)

## 2013-09-15 LAB — TSH: TSH: 3.445 u[IU]/mL (ref 0.350–4.500)

## 2013-10-06 ENCOUNTER — Other Ambulatory Visit: Payer: Self-pay | Admitting: Emergency Medicine

## 2013-10-14 ENCOUNTER — Telehealth: Payer: Self-pay | Admitting: Emergency Medicine

## 2013-10-14 MED ORDER — TIOTROPIUM BROMIDE MONOHYDRATE 18 MCG IN CAPS: ORAL_CAPSULE | RESPIRATORY_TRACT | Status: DC

## 2013-10-14 NOTE — Telephone Encounter (Signed)
Called spoke w/ pt. He needed a [facility]. I have done so. Nothing further needed

## 2013-10-18 ENCOUNTER — Ambulatory Visit: Payer: Medicare Other

## 2013-10-18 ENCOUNTER — Telehealth: Payer: Self-pay | Admitting: Emergency Medicine

## 2013-10-18 DIAGNOSIS — G4733 Obstructive sleep apnea (adult) (pediatric): Secondary | ICD-10-CM

## 2013-10-18 NOTE — Telephone Encounter (Signed)
Lmtcb.

## 2013-10-20 ENCOUNTER — Telehealth: Payer: Self-pay | Admitting: Emergency Medicine

## 2013-10-20 NOTE — Telephone Encounter (Signed)
Spoke with the pt  He is requesting to switch from [facility] to [facility] for CPAP supplies  He already has a CPAP machine  I have sent order to [facility] for this   Pt states that his spiriva was mailed to him from [facility] for only 30 caps I show in the computer that we sent 90 caps  I advised the pt will call [facility] to see what happened Called and spoke with the pharmacist at [facility]  Was advised that they only send out 90 days supply b/c they are already pre-packaged  Pt will need to call if there is an issue per pharmacist  Pt aware of this  I offered to give him samples to help and he states that this is not needed

## 2013-10-20 NOTE — Telephone Encounter (Signed)
Spoke with the pt regarding his Spiriva Rx- For further details please see PN dated 10/18/13  Pt states that he called [facility] and was advised that we sent 90 day supply spiriva on 10/14/13, but we also sent 30 days supply on 10/06/13  After reviewing refill encounter from 10/06/13 I do see that we sent only # 30  I apologized for this and advised that there is not much we can do in this situation since [facility] will not give him him money back  He is upset b/c he is retired and on a fixed income I offered samples so that when he runs out he will not have to order again, but he does not want this  I will talk with TD about this  Per TD offer samples is the only thing that we can offer  I spoke with the pt and notified him of this and he verbalized understanding He agreed to take some samples and so I left 8 boxes up front for pick up

## 2013-10-25 ENCOUNTER — Ambulatory Visit (INDEPENDENT_AMBULATORY_CARE_PROVIDER_SITE_OTHER): Payer: Medicare Other

## 2013-10-25 VITALS — BP 140/90 | HR 78 | Resp 16 | Ht 68.0 in | Wt 255.0 lb

## 2013-10-25 DIAGNOSIS — B351 Tinea unguium: Secondary | ICD-10-CM | POA: Diagnosis not present

## 2013-10-25 DIAGNOSIS — M79609 Pain in unspecified limb: Secondary | ICD-10-CM

## 2013-10-25 NOTE — Progress Notes (Signed)
   Subjective:    Patient ID: Richard Rosales, male    DOB: 11/29/1930, 80 y.o.   MRN: [id number]  HPI Comments: Pt presents for debridement of 1 - 10 toenails.     Review of Systems No new systemic changes or findings are noted    Objective:   Physical Exam Neurovascular status appears to be intact pedal pulses palpable DP +2/4 PT one over 4 bilateral capillary refill time 3 seconds all digits epicritic and proprioceptive sensations intact and symmetric and normal plantar response and DTRs noted. Nails thick brittle crumbly friable discolored tender on palpation with enclosed shoe wear. Orthopedic biomechanical exam rectus foot type mild flexible digital contractures noted no open wounds no secondary infections      Assessment & Plan:  Assessment this time is onychomycosis painful mycotic nails debrided 1 through 5 bilateral return for future palliative nail care is needed both hallux nails have some partial nail excision in the past have some arthrosis dystrophy discoloration nails and nail spicule growth which is debrided at this time. Recheck in 3 months for palliative care  Harriet Masson DPM

## 2013-10-25 NOTE — Patient Instructions (Signed)

## 2013-11-08 ENCOUNTER — Other Ambulatory Visit: Payer: Self-pay | Admitting: *Deleted

## 2013-11-08 MED ORDER — LOSARTAN POTASSIUM 100 MG PO TABS: 100.0000 mg | ORAL_TABLET | Freq: Every day | ORAL | Status: DC

## 2013-11-08 MED ORDER — LEVOTHYROXINE SODIUM 100 MCG PO TABS: 100.0000 ug | ORAL_TABLET | Freq: Every day | ORAL | Status: DC

## 2013-11-10 ENCOUNTER — Observation Stay (HOSPITAL_COMMUNITY)
Admission: EM | Admit: 2013-11-10 | Discharge: 2013-11-11 | Disposition: A | Discharge status: Home Health | Payer: Medicare Other | Attending: Internal Medicine | Admitting: Internal Medicine

## 2013-11-10 DIAGNOSIS — J309 Allergic rhinitis, unspecified: Secondary | ICD-10-CM

## 2013-11-10 DIAGNOSIS — K219 Gastro-esophageal reflux disease without esophagitis: Secondary | ICD-10-CM

## 2013-11-10 DIAGNOSIS — K922 Gastrointestinal hemorrhage, unspecified: Secondary | ICD-10-CM

## 2013-11-10 DIAGNOSIS — I10 Essential (primary) hypertension: Secondary | ICD-10-CM

## 2013-11-10 DIAGNOSIS — E039 Hypothyroidism, unspecified: Secondary | ICD-10-CM | POA: Diagnosis not present

## 2013-11-10 DIAGNOSIS — R5383 Other fatigue: Principal | ICD-10-CM

## 2013-11-10 DIAGNOSIS — G4733 Obstructive sleep apnea (adult) (pediatric): Secondary | ICD-10-CM | POA: Diagnosis not present

## 2013-11-10 DIAGNOSIS — R5381 Other malaise: Principal | ICD-10-CM | POA: Insufficient documentation

## 2013-11-10 DIAGNOSIS — Z96659 Presence of unspecified artificial knee joint: Secondary | ICD-10-CM | POA: Insufficient documentation

## 2013-11-10 DIAGNOSIS — Z86010 Personal history of colonic polyps: Secondary | ICD-10-CM

## 2013-11-10 DIAGNOSIS — Z87898 Personal history of other specified conditions: Secondary | ICD-10-CM

## 2013-11-10 DIAGNOSIS — E872 Acidosis, unspecified: Secondary | ICD-10-CM | POA: Diagnosis not present

## 2013-11-10 DIAGNOSIS — I5032 Chronic diastolic (congestive) heart failure: Secondary | ICD-10-CM | POA: Insufficient documentation

## 2013-11-10 DIAGNOSIS — M1990 Unspecified osteoarthritis, unspecified site: Secondary | ICD-10-CM | POA: Insufficient documentation

## 2013-11-10 DIAGNOSIS — J449 Chronic obstructive pulmonary disease, unspecified: Secondary | ICD-10-CM | POA: Diagnosis not present

## 2013-11-10 DIAGNOSIS — S0990XA Unspecified injury of head, initial encounter: Secondary | ICD-10-CM | POA: Diagnosis not present

## 2013-11-10 DIAGNOSIS — M75101 Unspecified rotator cuff tear or rupture of right shoulder, not specified as traumatic: Secondary | ICD-10-CM

## 2013-11-10 DIAGNOSIS — I509 Heart failure, unspecified: Secondary | ICD-10-CM | POA: Diagnosis not present

## 2013-11-10 DIAGNOSIS — K5730 Diverticulosis of large intestine without perforation or abscess without bleeding: Secondary | ICD-10-CM | POA: Insufficient documentation

## 2013-11-10 DIAGNOSIS — Z7982 Long term (current) use of aspirin: Secondary | ICD-10-CM | POA: Diagnosis not present

## 2013-11-10 DIAGNOSIS — W19XXXA Unspecified fall, initial encounter: Secondary | ICD-10-CM

## 2013-11-10 DIAGNOSIS — Z9181 History of falling: Secondary | ICD-10-CM | POA: Diagnosis not present

## 2013-11-10 DIAGNOSIS — S298XXA Other specified injuries of thorax, initial encounter: Secondary | ICD-10-CM | POA: Diagnosis not present

## 2013-11-10 DIAGNOSIS — I4891 Unspecified atrial fibrillation: Secondary | ICD-10-CM | POA: Insufficient documentation

## 2013-11-10 DIAGNOSIS — K5731 Diverticulosis of large intestine without perforation or abscess with bleeding: Secondary | ICD-10-CM

## 2013-11-10 DIAGNOSIS — R262 Difficulty in walking, not elsewhere classified: Secondary | ICD-10-CM | POA: Insufficient documentation

## 2013-11-10 DIAGNOSIS — R0602 Shortness of breath: Secondary | ICD-10-CM | POA: Diagnosis not present

## 2013-11-10 DIAGNOSIS — R531 Weakness: Secondary | ICD-10-CM

## 2013-11-10 DIAGNOSIS — I4892 Unspecified atrial flutter: Secondary | ICD-10-CM

## 2013-11-10 DIAGNOSIS — Z87891 Personal history of nicotine dependence: Secondary | ICD-10-CM | POA: Diagnosis not present

## 2013-11-10 DIAGNOSIS — J4489 Other specified chronic obstructive pulmonary disease: Secondary | ICD-10-CM | POA: Insufficient documentation

## 2013-11-10 NOTE — ED Notes (Signed)
Per EMS, the patient had fallen out of chair and was unable to get up.  He was on the floor for approximately 2 or 3 hours until wife returned to home.  On EMS arrival, the patient was diaphoretic, face and fingers were cyanotic, O2 sats were 92% after 1 min of non-rebreather.  He reported when he fell, he couldn't get himself up.  Hx of a-fib, alzheizmers, takes cardizem and lasix.  Rales in lungs initially, but once repositioned, lung sounds cleared.  No meds en route, 20g in L forearm on arrival.  VS: 160/97, P100-140's in a -fib.

## 2013-11-10 NOTE — ED Notes (Signed)
Josh, PA-C, at the bedside to see the patient.

## 2013-11-10 NOTE — ED Notes (Signed)
Patient has generalized swelling noted in face and bilateral lower extremities.

## 2013-11-11 ENCOUNTER — Ambulatory Visit: Payer: Medicare Other | Admitting: Emergency Medicine

## 2013-11-11 ENCOUNTER — Emergency Department (HOSPITAL_COMMUNITY): Payer: Medicare Other

## 2013-11-11 ENCOUNTER — Encounter (HOSPITAL_COMMUNITY): Payer: Self-pay | Admitting: Emergency Medicine

## 2013-11-11 DIAGNOSIS — S0990XA Unspecified injury of head, initial encounter: Secondary | ICD-10-CM | POA: Diagnosis not present

## 2013-11-11 DIAGNOSIS — I10 Essential (primary) hypertension: Secondary | ICD-10-CM | POA: Diagnosis not present

## 2013-11-11 DIAGNOSIS — J449 Chronic obstructive pulmonary disease, unspecified: Secondary | ICD-10-CM | POA: Diagnosis not present

## 2013-11-11 DIAGNOSIS — R5381 Other malaise: Secondary | ICD-10-CM | POA: Diagnosis not present

## 2013-11-11 DIAGNOSIS — R5383 Other fatigue: Principal | ICD-10-CM

## 2013-11-11 DIAGNOSIS — S298XXA Other specified injuries of thorax, initial encounter: Secondary | ICD-10-CM | POA: Diagnosis not present

## 2013-11-11 DIAGNOSIS — E039 Hypothyroidism, unspecified: Secondary | ICD-10-CM

## 2013-11-11 DIAGNOSIS — I5032 Chronic diastolic (congestive) heart failure: Secondary | ICD-10-CM

## 2013-11-11 DIAGNOSIS — I4891 Unspecified atrial fibrillation: Secondary | ICD-10-CM

## 2013-11-11 LAB — BASIC METABOLIC PANEL
BUN: 13 mg/dL (ref 6–23)
BUN: 14 mg/dL (ref 6–23)
CALCIUM: 9.2 mg/dL (ref 8.4–10.5)
CHLORIDE: 97 meq/L (ref 96–112)
CO2: 24 meq/L (ref 19–32)
CO2: 28 meq/L (ref 19–32)
Calcium: 8.9 mg/dL (ref 8.4–10.5)
Chloride: 95 mEq/L — ABNORMAL LOW (ref 96–112)
Creatinine, Ser: 0.63 mg/dL (ref 0.50–1.35)
Creatinine, Ser: 0.7 mg/dL (ref 0.50–1.35)
GFR calc Af Amer: >90 mL/min (ref >90)
GFR calc non Af Amer: 87 mL/min — ABNORMAL LOW (ref >90)
GFR calc non Af Amer: >90 mL/min (ref >90)
GLUCOSE: 109 mg/dL — AB (ref 70–99)
Glucose, Bld: 107 mg/dL — ABNORMAL HIGH (ref 70–99)
POTASSIUM: 3.9 meq/L (ref 3.7–5.3)
Potassium: 4 mEq/L (ref 3.7–5.3)
SODIUM: 139 meq/L (ref 137–147)
Sodium: 136 mEq/L — ABNORMAL LOW (ref 137–147)

## 2013-11-11 LAB — URINALYSIS, ROUTINE W REFLEX MICROSCOPIC
BILIRUBIN URINE: NEGATIVE
Glucose, UA: NEGATIVE mg/dL
KETONES UR: 15 mg/dL — AB
Leukocytes, UA: NEGATIVE
Nitrite: NEGATIVE
PROTEIN: 100 mg/dL — AB
Specific Gravity, Urine: 1.021 (ref 1.005–1.030)
Urobilinogen, UA: 1 mg/dL (ref 0.0–1.0)
pH: 6.5 (ref 5.0–8.0)

## 2013-11-11 LAB — I-STAT TROPONIN, ED: TROPONIN I, POC: 0.05 ng/mL (ref 0.00–0.08)

## 2013-11-11 LAB — URINE MICROSCOPIC-ADD ON

## 2013-11-11 LAB — I-STAT CG4 LACTIC ACID, ED
Lactic Acid, Venous: 1.41 mmol/L (ref 0.5–2.2)
Lactic Acid, Venous: 4.69 mmol/L — ABNORMAL HIGH (ref 0.5–2.2)

## 2013-11-11 LAB — CBC
HCT: 44.1 % (ref 39.0–52.0)
HEMOGLOBIN: 14.5 g/dL (ref 13.0–17.0)
MCH: 30.5 pg (ref 26.0–34.0)
MCHC: 32.9 g/dL (ref 30.0–36.0)
MCV: 92.8 fL (ref 78.0–100.0)
Platelets: 175 10*3/uL (ref 150–400)
RBC: 4.75 MIL/uL (ref 4.22–5.81)
RDW: 13.3 % (ref 11.5–15.5)
WBC: 10 10*3/uL (ref 4.0–10.5)

## 2013-11-11 LAB — CBC WITH DIFFERENTIAL/PLATELET
BASOS ABS: 0 10*3/uL (ref 0.0–0.1)
Basophils Relative: 0 % (ref 0–1)
EOS PCT: 0 % (ref 0–5)
Eosinophils Absolute: 0 10*3/uL (ref 0.0–0.7)
HCT: 46.2 % (ref 39.0–52.0)
Hemoglobin: 15.6 g/dL (ref 13.0–17.0)
LYMPHS PCT: 6 % — AB (ref 12–46)
Lymphs Abs: 0.8 10*3/uL (ref 0.7–4.0)
MCH: 30.8 pg (ref 26.0–34.0)
MCHC: 33.8 g/dL (ref 30.0–36.0)
MCV: 91.3 fL (ref 78.0–100.0)
Monocytes Absolute: 1.1 10*3/uL — ABNORMAL HIGH (ref 0.1–1.0)
Monocytes Relative: 8 % (ref 3–12)
Neutro Abs: 11.7 10*3/uL — ABNORMAL HIGH (ref 1.7–7.7)
Neutrophils Relative %: 86 % — ABNORMAL HIGH (ref 43–77)
PLATELETS: 185 10*3/uL (ref 150–400)
RBC: 5.06 MIL/uL (ref 4.22–5.81)
RDW: 13.4 % (ref 11.5–15.5)
WBC: 13.6 10*3/uL — AB (ref 4.0–10.5)

## 2013-11-11 LAB — CK
CK TOTAL: 3234 U/L — AB (ref 7–232)
Total CK: 494 U/L — ABNORMAL HIGH (ref 7–232)

## 2013-11-11 LAB — LACTIC ACID, PLASMA: Lactic Acid, Venous: 1.3 mmol/L (ref 0.5–2.2)

## 2013-11-11 MED ORDER — ASPIRIN EC 81 MG PO TBEC
81.0000 mg | DELAYED_RELEASE_TABLET | Freq: Every day | ORAL | Status: DC
  Administered 2013-11-11: 81 mg via ORAL
  Filled 2013-11-11: qty 1

## 2013-11-11 MED ORDER — DOCUSATE SODIUM 100 MG PO CAPS
100.0000 mg | ORAL_CAPSULE | Freq: Two times a day (BID) | ORAL | Status: DC
  Administered 2013-11-11: 100 mg via ORAL
  Filled 2013-11-11 (×2): qty 1

## 2013-11-11 MED ORDER — ENOXAPARIN SODIUM 40 MG/0.4ML ~~LOC~~ SOLN
40.0000 mg | SUBCUTANEOUS | Status: DC
  Filled 2013-11-11 (×2): qty 0.4

## 2013-11-11 MED ORDER — GUAIFENESIN 200 MG PO TABS
400.0000 mg | ORAL_TABLET | Freq: Two times a day (BID) | ORAL | Status: DC
  Administered 2013-11-11: 400 mg via ORAL
  Filled 2013-11-11 (×2): qty 2

## 2013-11-11 MED ORDER — ONDANSETRON HCL 4 MG/2ML IJ SOLN: 4.0000 mg | Freq: Four times a day (QID) | INTRAMUSCULAR | Status: DC | PRN

## 2013-11-11 MED ORDER — ACETAMINOPHEN-CODEINE #3 300-30 MG PO TABS: 1.0000 | ORAL_TABLET | ORAL | Status: DC | PRN

## 2013-11-11 MED ORDER — DILTIAZEM HCL ER 60 MG PO CP12
240.0000 mg | ORAL_CAPSULE | Freq: Two times a day (BID) | ORAL | Status: DC
  Administered 2013-11-11: 240 mg via ORAL
  Filled 2013-11-11 (×2): qty 4

## 2013-11-11 MED ORDER — SODIUM CHLORIDE 0.9 % IV SOLN
INTRAVENOUS | Status: DC
  Administered 2013-11-11: 01:00:00 via INTRAVENOUS

## 2013-11-11 MED ORDER — AZELASTINE HCL 0.1 % NA SOLN
1.0000 | Freq: Every day | NASAL | Status: DC
  Filled 2013-11-11: qty 30

## 2013-11-11 MED ORDER — PSYLLIUM 95 % PO PACK
1.0000 | PACK | Freq: Two times a day (BID) | ORAL | Status: DC
  Administered 2013-11-11: 1 via ORAL
  Filled 2013-11-11 (×2): qty 1

## 2013-11-11 MED ORDER — ACETAMINOPHEN 650 MG RE SUPP: 650.0000 mg | Freq: Four times a day (QID) | RECTAL | Status: DC | PRN

## 2013-11-11 MED ORDER — SODIUM CHLORIDE 0.9 % IJ SOLN
3.0000 mL | Freq: Two times a day (BID) | INTRAMUSCULAR | Status: DC
  Administered 2013-11-11: 3 mL via INTRAVENOUS

## 2013-11-11 MED ORDER — PANTOPRAZOLE SODIUM 40 MG PO TBEC
40.0000 mg | DELAYED_RELEASE_TABLET | Freq: Every day | ORAL | Status: DC
  Administered 2013-11-11: 40 mg via ORAL
  Filled 2013-11-11: qty 1

## 2013-11-11 MED ORDER — DOXYCYCLINE HYCLATE 100 MG PO TABS
100.0000 mg | ORAL_TABLET | Freq: Two times a day (BID) | ORAL | Status: DC
  Administered 2013-11-11: 100 mg via ORAL
  Filled 2013-11-11 (×3): qty 1

## 2013-11-11 MED ORDER — VITAMIN D 1000 UNITS PO TABS
2000.0000 [IU] | ORAL_TABLET | Freq: Every day | ORAL | Status: DC
  Administered 2013-11-11: 2000 [IU] via ORAL
  Filled 2013-11-11: qty 2

## 2013-11-11 MED ORDER — LEVOTHYROXINE SODIUM 100 MCG PO TABS
100.0000 ug | ORAL_TABLET | Freq: Every day | ORAL | Status: DC
  Administered 2013-11-11: 100 ug via ORAL
  Filled 2013-11-11 (×2): qty 1

## 2013-11-11 MED ORDER — MODAFINIL 200 MG PO TABS
100.0000 mg | ORAL_TABLET | Freq: Every day | ORAL | Status: DC
  Administered 2013-11-11: 100 mg via ORAL

## 2013-11-11 MED ORDER — LOSARTAN POTASSIUM 50 MG PO TABS
100.0000 mg | ORAL_TABLET | Freq: Every day | ORAL | Status: DC
  Administered 2013-11-11: 100 mg via ORAL
  Filled 2013-11-11: qty 2

## 2013-11-11 MED ORDER — HYDROCODONE-ACETAMINOPHEN 5-325 MG PO TABS
1.0000 | ORAL_TABLET | Freq: Once | ORAL | Status: AC
  Administered 2013-11-11: 1 via ORAL
  Filled 2013-11-11: qty 1

## 2013-11-11 MED ORDER — MINOXIDIL 2.5 MG PO TABS
5.0000 mg | ORAL_TABLET | Freq: Every evening | ORAL | Status: DC
  Filled 2013-11-11: qty 2

## 2013-11-11 MED ORDER — TIOTROPIUM BROMIDE MONOHYDRATE 18 MCG IN CAPS
18.0000 ug | ORAL_CAPSULE | Freq: Every day | RESPIRATORY_TRACT | Status: DC
  Administered 2013-11-11: 18 ug via RESPIRATORY_TRACT
  Filled 2013-11-11: qty 5

## 2013-11-11 MED ORDER — ONDANSETRON HCL 4 MG PO TABS: 4.0000 mg | ORAL_TABLET | Freq: Four times a day (QID) | ORAL | Status: DC | PRN

## 2013-11-11 MED ORDER — FLUORESCEIN SODIUM 1 MG OP STRP
1.0000 | ORAL_STRIP | Freq: Once | OPHTHALMIC | Status: DC
  Filled 2013-11-11: qty 1

## 2013-11-11 MED ORDER — BUDESONIDE-FORMOTEROL FUMARATE 160-4.5 MCG/ACT IN AERO
2.0000 | INHALATION_SPRAY | Freq: Two times a day (BID) | RESPIRATORY_TRACT | Status: DC
  Filled 2013-11-11 (×2): qty 6

## 2013-11-11 MED ORDER — OMEGA-3 FATTY ACIDS 1000 MG PO CAPS
1.0000 g | ORAL_CAPSULE | Freq: Two times a day (BID) | ORAL | Status: DC
  Administered 2013-11-11: 1 g via ORAL
  Filled 2013-11-11 (×2): qty 1

## 2013-11-11 MED ORDER — TETRACAINE HCL 0.5 % OP SOLN
1.0000 [drp] | Freq: Once | OPHTHALMIC | Status: AC
  Administered 2013-11-11: 1 [drp] via OPHTHALMIC
  Filled 2013-11-11: qty 2

## 2013-11-11 MED ORDER — ACETAMINOPHEN 325 MG PO TABS: 650.0000 mg | ORAL_TABLET | Freq: Four times a day (QID) | ORAL | Status: DC | PRN

## 2013-11-11 MED ORDER — ALBUTEROL SULFATE (2.5 MG/3ML) 0.083% IN NEBU: 3.0000 mL | INHALATION_SOLUTION | Freq: Four times a day (QID) | RESPIRATORY_TRACT | Status: DC | PRN

## 2013-11-11 NOTE — Progress Notes (Signed)
  Pt admitted to the unit. Pt is stable, alert and oriented per baseline. Oriented to room, staff, and call bell. Educated to call for any assistance. Bed in lowest position, call bell within reach- will continue to monitor. 

## 2013-11-11 NOTE — ED Notes (Signed)
Dr. Opitz at the bedside.  

## 2013-11-11 NOTE — H&P (Signed)
[facility] History and Physical  Richard Rosales VWU:[id number] DOB: February 25, 1931 DOA: 11/10/2013  Referring physician: ER physician. PCP: Alesia Richards, MD   Chief Complaint: Weakness and fall.  HPI: Richard Rosales is a 80 y.o. male with history of chronic diastolic heart failure, OSA, atrial fibrillation patient does not want to be on Coumadin, COPD was brought to the ER after patient had a fall and was unable to get up. Patient around 7 PM last evening while trying to walk when his leg gave away. He fell but did not hit his head or lose consciousness and patient was unable to get up by himself. His wife was out and when she returned back found him on the floor and called EMS. Patient was initially anxious. Once patient reached ER his labs revealed elevated lactic acid levels and CT head was done which was negative. On exam patient was nonfocal. Patient was given fluids and lactic acid repeat was normal. Patient denies any chest pain or shortness of breath but the last few days has been having productive cough. In the ER patient was made to ambulate and patient was very unsteady and patient found it difficult to ambulate. Patient has been admitted for observation and get physical therapy consult.   Review of Systems: As presented in the history of presenting illness, rest negative.  Past Medical History  Diagnosis Date  . HTN (hypertension)   . Diastolic dysfunction   . Atrial fibrillation   . GERD (gastroesophageal reflux disease)   . Allergic rhinitis   . Diverticulosis of colon     recurrent GI bleeding  . OSA (obstructive sleep apnea)     compliants w/ CPAP  . Hyperplastic colon polyp 2003  . Chronic gastritis   . Positive PPD     remote  . Atrial flutter   . Acute bronchitis   . Anemia     iron deficient  . Shortness of breath   . Venous insufficiency   . Moderate aortic insufficiency   . DJD (degenerative joint disease)   . COPD (chronic obstructive  pulmonary disease)   . Hypothyroidism    Past Surgical History  Procedure Laterality Date  . Cardiac catheterization  7.27.05    revealed a preserved EF with no CAD  . Tonsillectomy    . Total knee arthroplasty      bilateral  . Nasal sinus surgery    . Colonoscopy    . Joint replacement  2001    lt total knee-rt one done 00  . Hernia repair      bilateral  . Hernia repair    . Transurethral resection of prostate     Social History:  reports that he quit smoking about 35 years ago. His smoking use included Cigarettes. He has a 20 pack-year smoking history. He has never used smokeless tobacco. He reports that he drinks alcohol. He reports that he does not use illicit drugs. Where does patient live home. Can patient participate in ADLs? Yes.  Allergies  Allergen Reactions  . Ace Inhibitors Cough  . Beta Adrenergic Blockers Other (See Comments)    bradycardia  . Fluticasone-Salmeterol     Unable to remember per pt  . Hytrin [Terazosin]     Nasal congestion   . Prednisone     Shakes / tremors  . Vioxx [Rofecoxib] Other (See Comments)    dyspepsia    Family History:  Family History  Problem Relation Age of Onset  . Hypertension Mother   .  Diabetes Father   . Lung cancer Brother     chemical      Prior to Admission medications   Medication Sig Start Date End Date Taking? Authorizing Provider  acetaminophen-codeine (TYLENOL #3) 300-30 MG per tablet Take 1 tablet by mouth every 4 (four) hours as needed for moderate pain.    Yes Historical Provider, MD  albuterol (PROAIR HFA) 108 (90 BASE) MCG/ACT inhaler Inhale 1 puff into the lungs every 6 (six) hours as needed for wheezing or shortness of breath. 09/06/13  Yes Collene Gobble, MD  aspirin 81 MG tablet Take 81 mg by mouth. 3 days per week   Yes Historical Provider, MD  azelastine (ASTELIN) 137 MCG/SPRAY nasal spray Place 1 spray into the nose at bedtime. Use in each nostril as directed   Yes Historical Provider, MD  b  complex vitamins tablet Take 1 tablet by mouth every morning.    Yes Historical Provider, MD  budesonide-formoterol (SYMBICORT) 160-4.5 MCG/ACT inhaler Inhale 2 puffs into the lungs 2 (two) times daily. 03/10/13  Yes Collene Gobble, MD  chlorpheniramine (CHLOR-TRIMETON) 4 MG tablet Take 4 mg by mouth every evening.   Yes Historical Provider, MD  Cholecalciferol (VITAMIN D) 2000 UNITS CAPS Take 1 capsule by mouth daily.    Yes Historical Provider, MD  diltiazem (DILACOR XR) 240 MG 24 hr capsule Take 240 mg by mouth 2 (two) times daily.   Yes Historical Provider, MD  docusate sodium (COLACE) 100 MG capsule Take 100 mg by mouth 2 (two) times daily.     Yes Historical Provider, MD  fish oil-omega-3 fatty acids 1000 MG capsule Take 1 g by mouth 2 (two) times daily.     Yes Historical Provider, MD  furosemide (LASIX) 40 MG tablet Take 40 mg by mouth 2 (two) times daily.    Yes Historical Provider, MD  Glucosamine HCl 1000 MG TABS Take 1 tablet by mouth daily.     Yes Historical Provider, MD  guaifenesin (HUMIBID E) 400 MG TABS Take 400 mg by mouth 2 (two) times daily.     Yes Historical Provider, MD  levothyroxine (SYNTHROID, LEVOTHROID) 100 MCG tablet Take 100 mcg by mouth daily before breakfast.   Yes Historical Provider, MD  losartan (COZAAR) 100 MG tablet Take 1 tablet (100 mg total) by mouth daily. 11/08/13  Yes Unk Pinto, MD  minoxidil (LONITEN) 2.5 MG tablet Take 5 mg by mouth every evening.   Yes Historical Provider, MD  omeprazole (PRILOSEC) 20 MG capsule Take 40 mg by mouth Twice daily before meals.  07/27/10  Yes Historical Provider, MD  PROVIGIL 200 MG tablet Take 100 mg by mouth daily as needed. 1/2 tab by mouth on Wed and Sun when pt drives 01/25/45  Yes Historical Provider, MD  Psyllium (METAMUCIL) 28.3 % POWD Take 1 scoop by mouth 2 (two) times daily. 1 scoop twice daily   Yes Historical Provider, MD  tiotropium (SPIRIVA) 18 MCG inhalation capsule Place 18 mcg into inhaler and inhale daily.    Yes Historical Provider, MD    Physical Exam: Filed Vitals:   11/11/13 0300 11/11/13 0315 11/11/13 0330 11/11/13 0345  BP: 163/82 145/96 161/82 163/89  Pulse: 98 89 61 97  Temp:      TempSrc:      Resp: 15 19 20 21   Height:      Weight:      SpO2: 97% 97% 96% 97%     General:  Well-developed and nourished.  Eyes:  Anicteric no pallor.  ENT: No discharge from the ears eyes nose mouth.  Neck: No mass felt.  Cardiovascular: S1-S2 heard.  Respiratory: No rhonchi or crepitations.  Abdomen: Soft nontender bowel sounds present.  Skin: No rash.  Musculoskeletal: No edema.  Psychiatric: Appears normal.  Neurologic: Alert oriented to time place and person. Moves all extremities.  Labs on Admission:  Basic Metabolic Panel:  Recent Labs Lab 11/10/13 2355  NA 139  K 4.0  CL 95*  CO2 24  GLUCOSE 107*  BUN 14  CREATININE 0.70  CALCIUM 9.2   Liver Function Tests: No results found for this basename: AST, ALT, ALKPHOS, BILITOT, PROT, ALBUMIN,  in the last 168 hours No results found for this basename: LIPASE, AMYLASE,  in the last 168 hours No results found for this basename: AMMONIA,  in the last 168 hours CBC:  Recent Labs Lab 11/10/13 2355  WBC 13.6*  NEUTROABS 11.7*  HGB 15.6  HCT 46.2  MCV 91.3  PLT 185   Cardiac Enzymes:  Recent Labs Lab 11/10/13 2355  CKTOTAL 494*    BNP (last 3 results) No results found for this basename: PROBNP,  in the last 8760 hours CBG: No results found for this basename: GLUCAP,  in the last 168 hours  Radiological Exams on Admission: Dg Chest 2 View  11/11/2013   CLINICAL DATA:  Fall, shortness of breath  EXAM: CHEST  2 VIEW  COMPARISON:  Prior radiograph from 02/07/2013  FINDINGS: Examination is somewhat limited due to lordotic technique. Cardiomegaly is stable as compared to prior study. Tortuosity of the intrathoracic aorta with atherosclerotic calcifications within the aortic arch again noted, unchanged. Prominent  right paratracheal density is unchanged dating back to multiple prior studies from 2011, likely representing ectatic arch vessel anatomy.  Lungs are mildly hypoinflated. No focal airspace consolidation identified. No pulmonary edema or pleural effusion. No pneumothorax.  No acute osseous abnormality. Multilevel degenerative changes within the visualized spine are not significantly changed.  IMPRESSION: 1. Stable appearance of the chest with no acute cardiopulmonary abnormality identified. 2. Stable cardiomegaly.   Electronically Signed   By: Jeannine Boga [name]   On: 11/11/2013 01:13   Ct Head Wo Contrast  11/11/2013   CLINICAL DATA:  History of trauma from a fall.  EXAM: CT HEAD WITHOUT CONTRAST  TECHNIQUE: Contiguous axial images were obtained from the base of the skull through the vertex without intravenous contrast.  COMPARISON:  No priors.  FINDINGS: Well-defined focus of low attenuation in the anterior limb of the left internal capsule, compatible with an old lacunar infarct. Patchy and confluent areas of decreased attenuation are noted throughout the deep and periventricular white matter of the cerebral hemispheres bilaterally, compatible with chronic microvascular ischemic disease. No acute displaced skull fractures are identified. No acute intracranial abnormality. Specifically, no evidence of acute post-traumatic intracranial hemorrhage, no definite regions of acute/subacute cerebral ischemia, no focal mass, mass effect, hydrocephalus or abnormal intra or extra-axial fluid collections. The visualized paranasal sinuses and mastoids are well pneumatized, with exception of some multifocal mucosal thickening in the maxillary, sphenoid and ethmoid sinuses bilaterally. Status post bilateral maxillary antrectomy.  IMPRESSION: 1. No evidence of significant acute traumatic injury to the skull or the brain. 2. Chronic microvascular ischemic changes and old left lacunar infarct, as above. 3. Status post  bilateral maxillary antrectomy with mild multifocal mucosal thickening throughout the paranasal sinuses, as above.   Electronically Signed   By: Vinnie Langton [name]   On: 11/11/2013  01:17    EKG: Independently reviewed - atrial fibrillation rate around 110 beats per minute with RBBB.  Assessment/Plan Principal Problem:   Weakness Active Problems:   SLEEP APNEA, OBSTRUCTIVE   HYPERTENSION, UNSPECIFIED   Atrial fibrillation   Chronic diastolic heart failure   COPD   1. Weakness - probably from deconditioning. At this time patient is receiving IV fluids and we will hold fluids after receiving 1 L. Hold Lasix for now. Check orthostatics in a.m. Get physical therapy consult. 2. Atrial fibrillation patient does not want to be on Coumadin - continue rate limiting medications. 3. COPD - not wheezing but has productive cough and has leukocytosis and patient has been placed on doxycycline. 4. Chronic diastolic heart failure - see #1. 5. OSA - on CPAP. 6. Hypothyroidism - continue Synthroid.  Patient's CK level was mildly elevated. Recheck in a.m.    Code Status: Full code.  Family Communication: Patient's wife.  Disposition Plan: Admit for observation.    [facility] Pager [phone].  If 7PM-7AM, please contact night-coverage [email or web address] Password [facility] 11/11/2013, 4:33 AM

## 2013-11-11 NOTE — ED Notes (Signed)
Patient reports he cannot tolerate CT testing without pain medication.  Reported to [place], PA-C.  Also, reported that family says the patient does not have history of alzheimers.

## 2013-11-11 NOTE — Progress Notes (Signed)
RN states there are 3 traumas- will call back for report.

## 2013-11-11 NOTE — ED Notes (Signed)
Will tell CT to delay for 15 min.

## 2013-11-11 NOTE — ED Notes (Signed)
Dr. Jabier Mutton (hospitalist) is at the bedside.

## 2013-11-11 NOTE — Discharge Summary (Signed)
Physician Discharge Summary  Richard Rosales EVO:[id number] DOB: 11/14/1930 DOA: 11/10/2013  PCP: Alesia Richards, MD  Admit date: 11/10/2013 Discharge date: 11/11/2013  Time spent: 45 minutes  Recommendations for Outpatient Follow-up:  -Will be discharged home today. -[place] services will be arranged prior to DC. -Advised to follow up with his PCP in 2 weeks.   Discharge Diagnoses:  Principal Problem:   Weakness Active Problems:   SLEEP APNEA, OBSTRUCTIVE   HYPERTENSION, UNSPECIFIED   Atrial fibrillation   Chronic diastolic heart failure   COPD   Discharge Condition: Stable and improved  Filed Weights   11/10/13 2332 11/11/13 0540  Weight: 115.667 kg (255 lb) 115.6 kg (254 lb 13.6 oz)    History of present illness:  Richard Rosales is a 80 y.o. male with history of chronic diastolic heart failure, OSA, atrial fibrillation patient does not want to be on Coumadin, COPD was brought to the ER after patient had a fall and was unable to get up. Patient around 7 PM last evening while trying to walk when his leg gave away. He fell but did not hit his head or lose consciousness and patient was unable to get up by himself. His wife was out and when she returned back found him on the floor and called EMS. Patient was initially anxious. Once patient reached ER his labs revealed elevated lactic acid levels and CT head was done which was negative. On exam patient was nonfocal. Patient was given fluids and lactic acid repeat was normal. Patient denies any chest pain or shortness of breath but the last few days has been having productive cough. In the ER patient was made to ambulate and patient was very unsteady and patient found it difficult to ambulate. Patient has been admitted for observation and to get a physical therapy consult.     Hospital Course:   Generalized Weakness/Falls -Likely has adult FTT. -No concrete lab/xray abnormalities. -Has been evaluated by PT and they are  recommending [facility] services which will be arranged prior to DC.  A Fib -Rate controlled. -Not on anticoagulation.  Chronic Diastolic CHF -Well compensated.  OSA -qHs CPAP.   Procedures:  None   Consultations:  None  Discharge Instructions  Discharge Instructions   Diet - low sodium heart healthy    Complete by:  As directed      Discontinue IV    Complete by:  As directed      Increase activity slowly    Complete by:  As directed             Medication List         acetaminophen-codeine 300-30 MG per tablet  Commonly known as:  TYLENOL #3  Take 1 tablet by mouth every 4 (four) hours as needed for moderate pain.     albuterol 108 (90 BASE) MCG/ACT inhaler  Commonly known as:  PROAIR HFA  Inhale 1 puff into the lungs every 6 (six) hours as needed for wheezing or shortness of breath.     aspirin 81 MG tablet  Take 81 mg by mouth. 3 days per week     azelastine 0.1 % nasal spray  Commonly known as:  ASTELIN  Place 1 spray into the nose at bedtime. Use in each nostril as directed     b complex vitamins tablet  Take 1 tablet by mouth every morning.     budesonide-formoterol 160-4.5 MCG/ACT inhaler  Commonly known as:  SYMBICORT  Inhale 2 puffs  into the lungs 2 (two) times daily.     chlorpheniramine 4 MG tablet  Commonly known as:  CHLOR-TRIMETON  Take 4 mg by mouth every evening.     diltiazem 240 MG 24 hr capsule  Commonly known as:  DILACOR XR  Take 240 mg by mouth 2 (two) times daily.     docusate sodium 100 MG capsule  Commonly known as:  COLACE  Take 100 mg by mouth 2 (two) times daily.     fish oil-omega-3 fatty acids 1000 MG capsule  Take 1 g by mouth 2 (two) times daily.     furosemide 40 MG tablet  Commonly known as:  LASIX  Take 40 mg by mouth 2 (two) times daily.     Glucosamine HCl 1000 MG Tabs  Take 1 tablet by mouth daily.     guaifenesin 400 MG Tabs tablet  Commonly known as:  HUMIBID E  Take 400 mg by mouth 2 (two) times  daily.     levothyroxine 100 MCG tablet  Commonly known as:  SYNTHROID, LEVOTHROID  Take 100 mcg by mouth daily before breakfast.     losartan 100 MG tablet  Commonly known as:  COZAAR  Take 1 tablet (100 mg total) by mouth daily.     METAMUCIL 28.3 % Powd  Generic drug:  Psyllium  Take 1 scoop by mouth 2 (two) times daily. 1 scoop twice daily     minoxidil 2.5 MG tablet  Commonly known as:  LONITEN  Take 5 mg by mouth every evening.     omeprazole 20 MG capsule  Commonly known as:  PRILOSEC  Take 40 mg by mouth Twice daily before meals.     PROVIGIL 200 MG tablet  Generic drug:  modafinil  Take 100 mg by mouth daily as needed. 1/2 tab by mouth on Wed and Sun when pt drives     tiotropium 18 MCG inhalation capsule  Commonly known as:  SPIRIVA  Place 18 mcg into inhaler and inhale daily.     Vitamin D 2000 UNITS Caps  Take 1 capsule by mouth daily.       Allergies  Allergen Reactions  . Ace Inhibitors Cough  . Beta Adrenergic Blockers Other (See Comments)    bradycardia  . Fluticasone-Salmeterol     Unable to remember per pt  . Hytrin [Terazosin]     Nasal congestion   . Prednisone     Shakes / tremors  . Vioxx [Rofecoxib] Other (See Comments)    dyspepsia       Follow-up Information   Follow up with MCKEOWN,WILLIAM DAVID, MD. Schedule an appointment as soon as possible for a visit in 2 weeks.   Specialty:  Internal Medicine   Contact information:   [address] [place] [address] [phone]       Follow up with [facility])    Contact information:   [address] [place]  [address] [phone]        The results of significant diagnostics from this hospitalization (including imaging, microbiology, ancillary and laboratory) are listed below for reference.    Significant Diagnostic Studies: Dg Chest 2 View  11/11/2013   CLINICAL DATA:   Fall, shortness of breath  EXAM: CHEST  2 VIEW  COMPARISON:  Prior radiograph from 02/07/2013  FINDINGS: Examination is somewhat limited due to lordotic technique. Cardiomegaly is stable as compared to prior study. Tortuosity of  the intrathoracic aorta with atherosclerotic calcifications within the aortic arch again noted, unchanged. Prominent right paratracheal density is unchanged dating back to multiple prior studies from 2011, likely representing ectatic arch vessel anatomy.  Lungs are mildly hypoinflated. No focal airspace consolidation identified. No pulmonary edema or pleural effusion. No pneumothorax.  No acute osseous abnormality. Multilevel degenerative changes within the visualized spine are not significantly changed.  IMPRESSION: 1. Stable appearance of the chest with no acute cardiopulmonary abnormality identified. 2. Stable cardiomegaly.   Electronically Signed   By: Jeannine Boga [name]   On: 11/11/2013 01:13   Ct Head Wo Contrast  11/11/2013   CLINICAL DATA:  History of trauma from a fall.  EXAM: CT HEAD WITHOUT CONTRAST  TECHNIQUE: Contiguous axial images were obtained from the base of the skull through the vertex without intravenous contrast.  COMPARISON:  No priors.  FINDINGS: Well-defined focus of low attenuation in the anterior limb of the left internal capsule, compatible with an old lacunar infarct. Patchy and confluent areas of decreased attenuation are noted throughout the deep and periventricular white matter of the cerebral hemispheres bilaterally, compatible with chronic microvascular ischemic disease. No acute displaced skull fractures are identified. No acute intracranial abnormality. Specifically, no evidence of acute post-traumatic intracranial hemorrhage, no definite regions of acute/subacute cerebral ischemia, no focal mass, mass effect, hydrocephalus or abnormal intra or extra-axial fluid collections. The visualized paranasal sinuses and mastoids are well pneumatized, with  exception of some multifocal mucosal thickening in the maxillary, sphenoid and ethmoid sinuses bilaterally. Status post bilateral maxillary antrectomy.  IMPRESSION: 1. No evidence of significant acute traumatic injury to the skull or the brain. 2. Chronic microvascular ischemic changes and old left lacunar infarct, as above. 3. Status post bilateral maxillary antrectomy with mild multifocal mucosal thickening throughout the paranasal sinuses, as above.   Electronically Signed   By: Vinnie Langton [name]   On: 11/11/2013 01:17    Microbiology: No results found for this or any previous visit (from the past 240 hour(s)).   Labs: Basic Metabolic Panel:  Recent Labs Lab 11/10/13 2355 11/11/13 0957  NA 139 136*  K 4.0 3.9  CL 95* 97  CO2 24 28  GLUCOSE 107* 109*  BUN 14 13  CREATININE 0.70 0.63  CALCIUM 9.2 8.9   Liver Function Tests: No results found for this basename: AST, ALT, ALKPHOS, BILITOT, PROT, ALBUMIN,  in the last 168 hours No results found for this basename: LIPASE, AMYLASE,  in the last 168 hours No results found for this basename: AMMONIA,  in the last 168 hours CBC:  Recent Labs Lab 11/10/13 2355 11/11/13 0957  WBC 13.6* 10.0  NEUTROABS 11.7*  --   HGB 15.6 14.5  HCT 46.2 44.1  MCV 91.3 92.8  PLT 185 175   Cardiac Enzymes:  Recent Labs Lab 11/10/13 2355 11/11/13 0957  CKTOTAL 494* 3234*   BNP: BNP (last 3 results) No results found for this basename: PROBNP,  in the last 8760 hours CBG: No results found for this basename: GLUCAP,  in the last 168 hours     Signed:  Erline Hau  [facility] Pager: [phone] 11/11/2013, 12:10 PM

## 2013-11-11 NOTE — ED Notes (Signed)
Josh, PA-C, at the bedside.  Discussed Lactic acid of 4.69.

## 2013-11-11 NOTE — ED Provider Notes (Signed)
CSN: [id number]     Arrival date & time 11/10/13  2326 History   First MD Initiated Contact with Patient 11/10/13 2331     Chief Complaint  Patient presents with  . Fall     (Consider location/radiation/quality/duration/timing/severity/associated sxs/prior Treatment) HPI Comments: Patient with history of HTN, A Fib, chronic diastolic HF on lasix and LE edema -- presents after a fall. Patient was at home alone. Patient states that he went to get up from a chair and his legs were weak and gave out. He was able to slide down to the floor using a chair. Patient states that he attempted to crawl but could not. He laid there for several hours on his stomach. Wife returned and called EMS. Upon EMS arrival patient was diaphoretic and had cyanosis. Oxygen was applied and patient improved. Patient was tachycardic from 100-140 with irregular rhythm. The patient also has right eye pain. She is unclear regarding history but thinks he may have poked his eye when he fell or was trying to crawl. Denies CP, SOB, palpitations. No HA, neck pain. He has chronic lower back pain that is at baseline. The onset of this condition was acute. The course is constant. Aggravating factors: none. Alleviating factors: none.    Patient is a 80 y.o. male presenting with fall. The history is provided by the patient and medical records.  Fall Associated symptoms include weakness. Pertinent negatives include no chest pain, fatigue, headaches, nausea, neck pain, numbness or vomiting.    Past Medical History  Diagnosis Date  . HTN (hypertension)   . Diastolic dysfunction   . Atrial fibrillation   . GERD (gastroesophageal reflux disease)   . Allergic rhinitis   . Diverticulosis of colon     recurrent GI bleeding  . OSA (obstructive sleep apnea)     compliants w/ CPAP  . Hyperplastic colon polyp 2003  . Chronic gastritis   . Positive PPD     remote  . Atrial flutter   . Acute bronchitis   . Anemia     iron deficient  .  Shortness of breath   . Venous insufficiency   . Moderate aortic insufficiency   . DJD (degenerative joint disease)   . COPD (chronic obstructive pulmonary disease)   . Hypothyroidism    Past Surgical History  Procedure Laterality Date  . Cardiac catheterization  7.27.05    revealed a preserved EF with no CAD  . Tonsillectomy    . Total knee arthroplasty      bilateral  . Nasal sinus surgery    . Colonoscopy    . Joint replacement  2001    lt total knee-rt one done 00  . Hernia repair      bilateral  . Hernia repair    . Transurethral resection of prostate     Family History  Problem Relation Age of Onset  . Hypertension Mother   . Diabetes Father   . Lung cancer Brother     chemical   History  Substance Use Topics  . Smoking status: Former Smoker -- 1.00 packs/day for 20 years    Types: Cigarettes    Quit date: 08/01/1978  . Smokeless tobacco: Never Used  . Alcohol Use: Yes     Comment: about 3 to 4 times a week    Review of Systems  Constitutional: Negative for fatigue.  HENT: Negative for tinnitus.   Eyes: Positive for pain and redness. Negative for photophobia and visual disturbance.  Respiratory: Negative  for shortness of breath.   Cardiovascular: Negative for chest pain.  Gastrointestinal: Negative for nausea and vomiting.  Musculoskeletal: Positive for gait problem. Negative for back pain and neck pain.  Skin: Negative for wound.  Neurological: Positive for weakness. Negative for dizziness, light-headedness, numbness and headaches.  Psychiatric/Behavioral: Negative for confusion and decreased concentration.    Allergies  Ace inhibitors; Beta adrenergic blockers; Fluticasone-salmeterol; Hytrin; Prednisone; and Vioxx  Home Medications   Prior to Admission medications   Medication Sig Start Date End Date Taking? Authorizing Provider  acetaminophen-codeine (TYLENOL #3) 300-30 MG per tablet Take 1 tablet by mouth every 4 (four) hours as needed.     Historical Provider, MD  albuterol (PROAIR HFA) 108 (90 BASE) MCG/ACT inhaler Inhale 1 puff into the lungs every 6 (six) hours as needed for wheezing or shortness of breath. 09/06/13   Collene Gobble, MD  aspirin 81 MG tablet Take 81 mg by mouth. 3 days per week    Historical Provider, MD  azelastine (ASTELIN) 137 MCG/SPRAY nasal spray Place 1 spray into the nose at bedtime. Use in each nostril as directed    Historical Provider, MD  b complex vitamins tablet Take 1 tablet by mouth every morning.     Historical Provider, MD  budesonide-formoterol (SYMBICORT) 160-4.5 MCG/ACT inhaler Inhale 2 puffs into the lungs 2 (two) times daily. 03/10/13   Collene Gobble, MD  Cholecalciferol (VITAMIN D) 2000 UNITS CAPS Take 2 capsules by mouth daily.     Historical Provider, MD  diltiazem (DILACOR XR) 240 MG 24 hr capsule TAKE 1 CAPSULE TWO TIMES A DAY    Thompson Grayer, MD  docusate sodium (COLACE) 100 MG capsule Take 100 mg by mouth 2 (two) times daily.      Historical Provider, MD  fish oil-omega-3 fatty acids 1000 MG capsule Take 1 g by mouth 2 (two) times daily.      Historical Provider, MD  furosemide (LASIX) 40 MG tablet Take 40 mg by mouth daily.     Historical Provider, MD  Glucosamine HCl 1000 MG TABS Take 1 tablet by mouth daily.      Historical Provider, MD  guaifenesin (HUMIBID E) 400 MG TABS Take 400 mg by mouth 2 (two) times daily.      Historical Provider, MD  losartan (COZAAR) 100 MG tablet Take 1 tablet (100 mg total) by mouth daily. 11/08/13   Unk Pinto, MD  minoxidil (LONITEN) 2.5 MG tablet Take daily at bedtime 02/23/13   Thompson Grayer, MD  omeprazole (PRILOSEC) 20 MG capsule Take 40 mg by mouth Twice daily before meals.  07/27/10   Historical Provider, MD  PROVIGIL 200 MG tablet Take 100 mg by mouth daily as needed. 1/2 tab by mouth on Wed and Sun when pt drives 01/22/69   Historical Provider, MD  Psyllium (METAMUCIL) 28.3 % POWD 1 scoop twice daily     Historical Provider, MD  tiotropium  (SPIRIVA HANDIHALER) 18 MCG inhalation capsule INHALE THE CONTENTS OF 1 CAPSULE DAILY AS DIRECTED IN THE PACKAGE 10/14/13   Collene Gobble, MD   BP 146/91  Pulse 92  Temp(Src) 98.3 F (36.8 C) (Oral)  Resp 27  Ht 5\' 9"  (1.753 m)  Wt 255 lb (115.667 kg)  BMI 37.64 kg/m2  SpO2 95% Physical Exam  Nursing note and vitals reviewed. Constitutional: He is oriented to person, place, and time. He appears well-developed and well-nourished.  HENT:  Head: Normocephalic and atraumatic. Head is without raccoon's eyes and without  Battle's sign.  Right Ear: Tympanic membrane, external ear and ear canal normal. No hemotympanum.  Left Ear: Tympanic membrane, external ear and ear canal normal. No hemotympanum.  Nose: Nose normal. No nasal septal hematoma.  Mouth/Throat: Oropharynx is clear and moist.  Eyes: EOM and lids are normal. Pupils are equal, round, and reactive to light. Right eye exhibits chemosis. Right eye exhibits no discharge. No foreign body present in the right eye. Left eye exhibits no chemosis and no discharge. No foreign body present in the left eye. Right eye exhibits normal extraocular motion and no nystagmus. Left eye exhibits normal extraocular motion and no nystagmus.  No visible hyphema  Neck: Normal range of motion. Neck supple.  Cardiovascular: An irregularly irregular rhythm present. Tachycardia present.   No murmur heard. Pulmonary/Chest: Effort normal and breath sounds normal. No respiratory distress. He has no wheezes. He has no rales.  Abdominal: Soft. There is no tenderness. There is no rebound and no guarding.  Musculoskeletal: He exhibits edema.       Right shoulder: Normal.       Left shoulder: Normal.       Right elbow: Normal.      Left elbow: Normal.       Right wrist: Normal.       Left wrist: Normal.       Right hip: Normal.       Left hip: Normal.       Right knee: Normal.       Left knee: Normal.       Right ankle: Normal.       Left ankle: Normal.        Cervical back: He exhibits normal range of motion, no tenderness and no bony tenderness.       Thoracic back: He exhibits no tenderness and no bony tenderness.       Lumbar back: He exhibits tenderness. He exhibits no bony tenderness.  Full ROM all joints. No skin signs of trauma. Bilateral symmetric LE edema at baseline per patient.   Neurological: He is alert and oriented to person, place, and time. He has normal strength and normal reflexes. No cranial nerve deficit or sensory deficit. Coordination normal. GCS eye subscore is 4. GCS verbal subscore is 5. GCS motor subscore is 6.  Skin: Skin is warm and dry.  Psychiatric: He has a normal mood and affect.    ED Course  Procedures (including critical care time) Labs Review Labs Reviewed  CBC WITH DIFFERENTIAL - Abnormal; Notable for the following:    WBC 13.6 (*)    Neutrophils Relative % 86 (*)    Neutro Abs 11.7 (*)    Lymphocytes Relative 6 (*)    Monocytes Absolute 1.1 (*)    All other components within normal limits  BASIC METABOLIC PANEL - Abnormal; Notable for the following:    Chloride 95 (*)    Glucose, Bld 107 (*)    GFR calc non Af Amer 87 (*)    All other components within normal limits  CK - Abnormal; Notable for the following:    Total CK 494 (*)    All other components within normal limits  I-STAT CG4 LACTIC ACID, ED - Abnormal; Notable for the following:    Lactic Acid, Venous 4.69 (*)    All other components within normal limits  URINALYSIS, ROUTINE W REFLEX MICROSCOPIC  I-STAT TROPOININ, ED    Imaging Review Dg Chest 2 View  11/11/2013   CLINICAL DATA:  Fall, shortness of breath  EXAM: CHEST  2 VIEW  COMPARISON:  Prior radiograph from 02/07/2013  FINDINGS: Examination is somewhat limited due to lordotic technique. Cardiomegaly is stable as compared to prior study. Tortuosity of the intrathoracic aorta with atherosclerotic calcifications within the aortic arch again noted, unchanged. Prominent right paratracheal  density is unchanged dating back to multiple prior studies from 2011, likely representing ectatic arch vessel anatomy.  Lungs are mildly hypoinflated. No focal airspace consolidation identified. No pulmonary edema or pleural effusion. No pneumothorax.  No acute osseous abnormality. Multilevel degenerative changes within the visualized spine are not significantly changed.  IMPRESSION: 1. Stable appearance of the chest with no acute cardiopulmonary abnormality identified. 2. Stable cardiomegaly.   Electronically Signed   By: Jeannine Boga [name]   On: 11/11/2013 01:13   Ct Head Wo Contrast  11/11/2013   CLINICAL DATA:  History of trauma from a fall.  EXAM: CT HEAD WITHOUT CONTRAST  TECHNIQUE: Contiguous axial images were obtained from the base of the skull through the vertex without intravenous contrast.  COMPARISON:  No priors.  FINDINGS: Well-defined focus of low attenuation in the anterior limb of the left internal capsule, compatible with an old lacunar infarct. Patchy and confluent areas of decreased attenuation are noted throughout the deep and periventricular white matter of the cerebral hemispheres bilaterally, compatible with chronic microvascular ischemic disease. No acute displaced skull fractures are identified. No acute intracranial abnormality. Specifically, no evidence of acute post-traumatic intracranial hemorrhage, no definite regions of acute/subacute cerebral ischemia, no focal mass, mass effect, hydrocephalus or abnormal intra or extra-axial fluid collections. The visualized paranasal sinuses and mastoids are well pneumatized, with exception of some multifocal mucosal thickening in the maxillary, sphenoid and ethmoid sinuses bilaterally. Status post bilateral maxillary antrectomy.  IMPRESSION: 1. No evidence of significant acute traumatic injury to the skull or the brain. 2. Chronic microvascular ischemic changes and old left lacunar infarct, as above. 3. Status post bilateral maxillary  antrectomy with mild multifocal mucosal thickening throughout the paranasal sinuses, as above.   Electronically Signed   By: Vinnie Langton [name]   On: 11/11/2013 01:17     EKG Interpretation   Date/Time:  Thursday Nov 10 2013 23:36:49 EDT Ventricular Rate:  110 PR Interval:  172 QRS Duration: 188 QT Interval:  431 QTC Calculation: 583 R Axis:   -96 Text Interpretation:  Atrial fibrillation Right bundle branch block  Nonspecific ST and T wave abnormality Abnormal ECG Confirmed by OPITZ  MD,  BRIAN ([id number]) on 11/10/2013 11:42:05 PM      Patient seen and examined. Work-up initiated. Medications ordered. D/w Dr. Marnette Burgess. EKG reviewed.   Vital signs reviewed and are as follows: Filed Vitals:   11/10/13 2345  BP: 146/91  Pulse: 92  Temp:   Resp: 27   1:23 AM Dr. Marnette Burgess has seen patient. Patient and family updated, they agree with admit in light of elevated lactate. Fluids going at 17mL/hr since arrival  CT and CXR reviewed.   Spoke with Dr. Hal Hope who requests recheck lactate. Ordered.   D/w Dr. Marnette Burgess who will follow-up on lab and dispo as appropriate.   MDM   Final diagnoses:  Fall  Lactic acidosis   Pending recheck on lactic acid.     Carlisle Cater, PA-C 11/11/13 0130

## 2013-11-11 NOTE — Progress Notes (Addendum)
CARE MANAGEMENT NOTE 11/11/2013  Patient:  Richard Rosales, Richard Rosales   Account Number:  [id number]  Date Initiated:  11/11/2013  Documentation initiated by:  [facility]  Subjective/Objective Assessment:   fall     Action/Plan:   [facility]   Anticipated DC Date:  11/11/2013   Anticipated DC Plan:  [place]  CM consult      [facility] Choice  HOME HEALTH   Choice offered to / List presented to:  C-1 Patient        [place] arranged  [place].   Status of service:  Completed, signed off Medicare Important Message given?  NA - LOS <3 / Initial given by admissions (If response is "NO", the following Medicare IM given date fields will be blank) Date Medicare IM given:   Date Additional Medicare IM given:    Discharge Disposition:  [place]  Per UR Regulation:    If discussed at Long Length of Stay Meetings, dates discussed:    Comments:  11/11/2013 1130 NCM spoke to pt and offered choice for [facility]. Pt states he has DME with [facility]. He has CPAP, RW and cane at home. Pt requested [facility], per rep no availability for [facility] at this time. Pt agreeable to [facility] for HH.  Notified [facility] of new referral.  Jonnie Finner RN CCM Case Mgmt phone [phone]

## 2013-11-11 NOTE — Progress Notes (Signed)
Patient was discharged home with home health services by MD order; discharged instructions  review and give to patient with care notes ; IV DIC; skin intact; patient will be escorted to the car by nurse tech via wheelchair.

## 2013-11-11 NOTE — Evaluation (Addendum)
Physical Therapy Evaluation Patient Details Name: Richard Rosales MRN: [id number] DOB: [birth year] Today's Date: 11/11/2013   History of Present Illness  Pt is an 80 year old male admitted 5/21 for weakness after sustaining fall at home with PMHx of HTN, afib, OSA, COPD, DJD, anemia.   Clinical Impression  Pt currently with functional limitations due to the deficits listed below (see PT Problem List).  Pt will benefit from skilled PT to increase their independence and safety with mobility to allow discharge to the venue listed below.  Pt able to tolerate short distance ambulation with RW for steadying.  Pt reports distance limited due to bil ankle/feet arthritis pain.  Pt very agreeable to HHPT as he wishes to learn how to safely get up from floor in case of fall, also recommended for home safety evaluation.  No unsteadiness observed today and pt reports he will use RW upon return home.     Follow Up Recommendations Home health PT;Supervision for mobility/OOB    Equipment Recommendations  None recommended by PT    Recommendations for Other Services       Precautions / Restrictions Precautions Precautions: Fall Restrictions Weight Bearing Restrictions: No      Mobility  Bed Mobility Overal bed mobility: Needs Assistance Bed Mobility: Supine to Sit     Supine to sit: Supervision;HOB elevated     General bed mobility comments: increased time, used rail  Transfers Overall transfer level: Needs assistance Equipment used: Rolling walker (2 wheeled) Transfers: Sit to/from Stand Sit to Stand: Min guard         General transfer comment: verbal cues for safe technique  Ambulation/Gait Ambulation/Gait assistance: Min guard Ambulation Distance (Feet): 80 Feet Assistive device: Rolling walker (2 wheeled) Gait Pattern/deviations: Step-through pattern;Trunk flexed;Decreased stride length     General Gait Details: steady with RW, no buckling of LEs observed (pt reports this was  cause of his fall prior to admission), pt agreeable to use RW upon d/c for safety  Stairs            Wheelchair Mobility    Modified Rankin (Stroke Patients Only)       Balance                                             Pertinent Vitals/Pain bil feet/ankle pain from arthritis, activity to tolerance    Home Living Family/patient expects to be discharged to:: Private residence Living Arrangements: Spouse/significant other   Type of Home: House       Home Layout: Two level Home Equipment: Environmental consultant - 2 wheels;Cane - single point      Prior Function Level of Independence: Independent with assistive device(s)         Comments: typically uses SPC, reports short distance ambulation due to bil ankle/feet arthritis pain     Hand Dominance        Extremity/Trunk Assessment               Lower Extremity Assessment: Generalized weakness      Cervical / Trunk Assessment: Kyphotic  Communication   Communication: No difficulties  Cognition Arousal/Alertness: Awake/alert Behavior During Therapy: WFL for tasks assessed/performed Overall Cognitive Status: Within Functional Limits for tasks assessed                      General Comments  Exercises        Assessment/Plan    PT Assessment Patient needs continued PT services  PT Diagnosis Generalized weakness   PT Problem List Decreased strength;Decreased mobility;Decreased knowledge of use of DME;Decreased safety awareness  PT Treatment Interventions DME instruction;Gait training;Therapeutic activities;Functional mobility training;Therapeutic exercise;Patient/family education   PT Goals (Current goals can be found in the Care Plan section) Acute Rehab PT Goals Patient Stated Goal: wants to learn how to get up from floor at home in case of fall PT Goal Formulation: With patient Time For Goal Achievement: 11/18/13 Potential to Achieve Goals: Good    Frequency Min  3X/week   Barriers to discharge        Co-evaluation               End of Session Equipment Utilized During Treatment: Gait belt;Oxygen Activity Tolerance: Patient tolerated treatment well Patient left: in chair;with call bell/phone within reach Nurse Communication:  (RN medicated pt while PT in room)    Functional Assessment Tool Used: clinical judgement Functional Limitation: Mobility: Walking and moving around Mobility: Walking and Moving Around Current Status (B0175): At least 1 percent but less than 20 percent impaired, limited or restricted Mobility: Walking and Moving Around Goal Status [phone]): 0 percent impaired, limited or restricted    Time: 0919-0940 PT Time Calculation (min): 21 min   Charges:   PT Evaluation $Initial PT Evaluation Tier I: 1 Procedure PT Treatments $Gait Training: 8-22 mins   PT G Codes:   Functional Assessment Tool Used: clinical judgement Functional Limitation: Mobility: Walking and moving around    [employer] 11/11/2013, 10:41 AM Carmelia Bake, PT, DPT 11/11/2013 Pager: [phone]

## 2013-11-11 NOTE — ED Provider Notes (Signed)
Medical screening examination/treatment/procedure(s) were conducted as a shared visit with non-physician practitioner(s) and myself.  I personally evaluated the patient during the encounter.   EKG Interpretation   Date/Time:  Thursday Nov 10 2013 23:36:49 EDT Ventricular Rate:  110 PR Interval:  172 QRS Duration: 188 QT Interval:  431 QTC Calculation: 583 R Axis:   -96 Text Interpretation:  Atrial fibrillation Right bundle branch block  Nonspecific ST and T wave abnormality Abnormal ECG Confirmed by Navraj Dreibelbis  MD,  Antanette Richwine ([id number]) on 11/10/2013 11:42:05 PM     Fall unable to get up, down for about 3 hours, reported AMS per EMS. Per PT - is feeling better but has generalized weakness, esp in his legs. No CP, SOB, ABD pain, HA or unilateral weakness/ numbness. Labs, ECG, imaging reviewed. MED admit  Teressa Lower, MD 11/11/13 [phone]

## 2013-11-11 NOTE — ED Notes (Signed)
Discussed plan of care with Dr. Jabier Mutton. Patient only received low dose saline at 129mL/hr since admission.  Hospitalist allows patient to take home medications, and patient is to finish this liter of normal saline at 130mL/hr, then discontinue fluids once done. Will inform receiving nurse.

## 2013-11-13 DIAGNOSIS — I5032 Chronic diastolic (congestive) heart failure: Secondary | ICD-10-CM | POA: Diagnosis not present

## 2013-11-13 DIAGNOSIS — G4730 Sleep apnea, unspecified: Secondary | ICD-10-CM | POA: Diagnosis not present

## 2013-11-13 DIAGNOSIS — J449 Chronic obstructive pulmonary disease, unspecified: Secondary | ICD-10-CM | POA: Diagnosis not present

## 2013-11-13 DIAGNOSIS — W19XXXA Unspecified fall, initial encounter: Secondary | ICD-10-CM | POA: Diagnosis not present

## 2013-11-13 DIAGNOSIS — IMO0001 Reserved for inherently not codable concepts without codable children: Secondary | ICD-10-CM | POA: Diagnosis not present

## 2013-11-13 DIAGNOSIS — I4891 Unspecified atrial fibrillation: Secondary | ICD-10-CM | POA: Diagnosis not present

## 2013-11-13 DIAGNOSIS — I10 Essential (primary) hypertension: Secondary | ICD-10-CM | POA: Diagnosis not present

## 2013-11-13 DIAGNOSIS — R5381 Other malaise: Secondary | ICD-10-CM | POA: Diagnosis not present

## 2013-11-13 DIAGNOSIS — R5383 Other fatigue: Secondary | ICD-10-CM

## 2013-11-13 DIAGNOSIS — R627 Adult failure to thrive: Secondary | ICD-10-CM | POA: Diagnosis not present

## 2013-11-13 DIAGNOSIS — I509 Heart failure, unspecified: Secondary | ICD-10-CM | POA: Diagnosis not present

## 2013-11-16 DIAGNOSIS — I5032 Chronic diastolic (congestive) heart failure: Secondary | ICD-10-CM | POA: Diagnosis not present

## 2013-11-16 DIAGNOSIS — I4891 Unspecified atrial fibrillation: Secondary | ICD-10-CM | POA: Diagnosis not present

## 2013-11-16 DIAGNOSIS — R627 Adult failure to thrive: Secondary | ICD-10-CM | POA: Diagnosis not present

## 2013-11-16 DIAGNOSIS — R5383 Other fatigue: Secondary | ICD-10-CM | POA: Diagnosis not present

## 2013-11-16 DIAGNOSIS — G4730 Sleep apnea, unspecified: Secondary | ICD-10-CM | POA: Diagnosis not present

## 2013-11-16 DIAGNOSIS — IMO0001 Reserved for inherently not codable concepts without codable children: Secondary | ICD-10-CM | POA: Diagnosis not present

## 2013-11-16 DIAGNOSIS — R5381 Other malaise: Secondary | ICD-10-CM | POA: Diagnosis not present

## 2013-11-17 ENCOUNTER — Telehealth: Payer: Self-pay

## 2013-11-17 ENCOUNTER — Ambulatory Visit (HOSPITAL_COMMUNITY)
Admission: RE | Admit: 2013-11-17 | Discharge: 2013-11-17 | Disposition: A | Discharge status: Home / Self Care | Payer: Medicare Other | Source: Ambulatory Visit | Attending: Internal Medicine | Admitting: Internal Medicine

## 2013-11-17 ENCOUNTER — Telehealth: Payer: Self-pay | Admitting: *Deleted

## 2013-11-17 ENCOUNTER — Other Ambulatory Visit: Payer: Self-pay

## 2013-11-17 ENCOUNTER — Ambulatory Visit (INDEPENDENT_AMBULATORY_CARE_PROVIDER_SITE_OTHER): Payer: Medicare Other | Admitting: Emergency Medicine

## 2013-11-17 ENCOUNTER — Encounter: Payer: Self-pay | Admitting: Emergency Medicine

## 2013-11-17 VITALS — BP 108/62 | HR 66 | Temp 97.8°F | Resp 18 | Ht 69.0 in | Wt 258.0 lb

## 2013-11-17 DIAGNOSIS — I4891 Unspecified atrial fibrillation: Secondary | ICD-10-CM | POA: Diagnosis not present

## 2013-11-17 DIAGNOSIS — Y92009 Unspecified place in unspecified non-institutional (private) residence as the place of occurrence of the external cause: Secondary | ICD-10-CM

## 2013-11-17 DIAGNOSIS — W19XXXA Unspecified fall, initial encounter: Secondary | ICD-10-CM

## 2013-11-17 DIAGNOSIS — G4730 Sleep apnea, unspecified: Secondary | ICD-10-CM | POA: Diagnosis not present

## 2013-11-17 DIAGNOSIS — R0781 Pleurodynia: Secondary | ICD-10-CM

## 2013-11-17 DIAGNOSIS — I517 Cardiomegaly: Secondary | ICD-10-CM | POA: Diagnosis not present

## 2013-11-17 DIAGNOSIS — R5383 Other fatigue: Secondary | ICD-10-CM | POA: Diagnosis not present

## 2013-11-17 DIAGNOSIS — S298XXA Other specified injuries of thorax, initial encounter: Secondary | ICD-10-CM | POA: Diagnosis not present

## 2013-11-17 DIAGNOSIS — R0602 Shortness of breath: Secondary | ICD-10-CM | POA: Diagnosis not present

## 2013-11-17 DIAGNOSIS — R079 Chest pain, unspecified: Secondary | ICD-10-CM | POA: Diagnosis not present

## 2013-11-17 DIAGNOSIS — R6889 Other general symptoms and signs: Secondary | ICD-10-CM

## 2013-11-17 DIAGNOSIS — I5032 Chronic diastolic (congestive) heart failure: Secondary | ICD-10-CM | POA: Diagnosis not present

## 2013-11-17 DIAGNOSIS — IMO0001 Reserved for inherently not codable concepts without codable children: Secondary | ICD-10-CM | POA: Diagnosis not present

## 2013-11-17 DIAGNOSIS — R5381 Other malaise: Secondary | ICD-10-CM | POA: Diagnosis not present

## 2013-11-17 DIAGNOSIS — R627 Adult failure to thrive: Secondary | ICD-10-CM | POA: Diagnosis not present

## 2013-11-17 LAB — CBC WITH DIFFERENTIAL/PLATELET
BASOS ABS: 0.1 10*3/uL (ref 0.0–0.1)
BASOS PCT: 1 % (ref 0–1)
EOS PCT: 5 % (ref 0–5)
Eosinophils Absolute: 0.4 10*3/uL (ref 0.0–0.7)
HCT: 42.2 % (ref 39.0–52.0)
Hemoglobin: 14.3 g/dL (ref 13.0–17.0)
Lymphocytes Relative: 18 % (ref 12–46)
Lymphs Abs: 1.5 10*3/uL (ref 0.7–4.0)
MCH: 29.9 pg (ref 26.0–34.0)
MCHC: 33.9 g/dL (ref 30.0–36.0)
MCV: 88.3 fL (ref 78.0–100.0)
MONO ABS: 0.9 10*3/uL (ref 0.1–1.0)
Monocytes Relative: 11 % (ref 3–12)
Neutro Abs: 5.5 10*3/uL (ref 1.7–7.7)
Neutrophils Relative %: 65 % (ref 43–77)
PLATELETS: 236 10*3/uL (ref 150–400)
RBC: 4.78 MIL/uL (ref 4.22–5.81)
RDW: 13.5 % (ref 11.5–15.5)
WBC: 8.4 10*3/uL (ref 4.0–10.5)

## 2013-11-17 LAB — BASIC METABOLIC PANEL WITH GFR
BUN: 11 mg/dL (ref 6–23)
CHLORIDE: 100 meq/L (ref 96–112)
CO2: 27 meq/L (ref 19–32)
CREATININE: 0.75 mg/dL (ref 0.50–1.35)
Calcium: 9.1 mg/dL (ref 8.4–10.5)
GFR, EST NON AFRICAN AMERICAN: 87 mL/min
GFR, Est African American: >89 mL/min
Glucose, Bld: 92 mg/dL (ref 70–99)
Potassium: 4.1 mEq/L (ref 3.5–5.3)
Sodium: 138 mEq/L (ref 135–145)

## 2013-11-17 LAB — CK: Total CK: 229 U/L (ref 7–232)

## 2013-11-17 NOTE — Progress Notes (Signed)
Subjective:    Patient ID: Richard Rosales, male    DOB: [birth year], 80 y.o.   MRN: [id number]  HPI Comments: 80 yo male fell and was lying on stomach x 3 hours and then was put in hospital x 1 day. He had elevated CPK and was released. He notes initially pain was 9/10 but now 2/10. He notes he is still fatigued but overall feeling better. He was advised to stop furosemide at hospital. He notes SOB is only with exertion and pain. He notes increased edema in LE x 2 days and took one lasix this a.m. He denies CP.  Fall     Medication List       This list is accurate as of: 11/17/13 12:04 PM.  Always use your most recent med list.               acetaminophen-codeine 300-30 MG per tablet  Commonly known as:  TYLENOL #3  Take 1 tablet by mouth every 4 (four) hours as needed for moderate pain.     albuterol 108 (90 BASE) MCG/ACT inhaler  Commonly known as:  PROAIR HFA  Inhale 1 puff into the lungs every 6 (six) hours as needed for wheezing or shortness of breath.     aspirin 81 MG tablet  Take 81 mg by mouth. 3 days per week     azelastine 0.1 % nasal spray  Commonly known as:  ASTELIN  Place 1 spray into the nose at bedtime. Use in each nostril as directed     b complex vitamins tablet  Take 1 tablet by mouth every morning.     budesonide-formoterol 160-4.5 MCG/ACT inhaler  Commonly known as:  SYMBICORT  Inhale 2 puffs into the lungs 2 (two) times daily.     chlorpheniramine 4 MG tablet  Commonly known as:  CHLOR-TRIMETON  Take 4 mg by mouth every evening.     diltiazem 240 MG 24 hr capsule  Commonly known as:  DILACOR XR  Take 240 mg by mouth 2 (two) times daily.     docusate sodium 100 MG capsule  Commonly known as:  COLACE  Take 100 mg by mouth 2 (two) times daily.     fish oil-omega-3 fatty acids 1000 MG capsule  Take 1 g by mouth 2 (two) times daily.     furosemide 40 MG tablet  Commonly known as:  LASIX  Take 40 mg by mouth 2 (two) times daily as needed.     Glucosamine HCl 1000 MG Tabs  Take 1 tablet by mouth daily.     guaifenesin 400 MG Tabs tablet  Commonly known as:  HUMIBID E  Take 400 mg by mouth 2 (two) times daily.     levothyroxine 100 MCG tablet  Commonly known as:  SYNTHROID, LEVOTHROID  Take 100 mcg by mouth daily before breakfast.     losartan 100 MG tablet  Commonly known as:  COZAAR  Take 1 tablet (100 mg total) by mouth daily.     METAMUCIL 28.3 % Powd  Generic drug:  Psyllium  Take 1 scoop by mouth 2 (two) times daily. 1 scoop twice daily     minoxidil 2.5 MG tablet  Commonly known as:  LONITEN  Take 5 mg by mouth every evening.     omeprazole 40 MG capsule  Commonly known as:  PRILOSEC  Take 40 mg by mouth 2 (two) times daily.     PROVIGIL 200 MG tablet  Generic drug:  modafinil  Take 100 mg by mouth daily as needed. 1/2 tab by mouth on Wed and Sun when pt drives     tiotropium 18 MCG inhalation capsule  Commonly known as:  SPIRIVA  Place 18 mcg into inhaler and inhale daily.     Vitamin D 2000 UNITS Caps  Take 1 capsule by mouth daily.       Allergies  Allergen Reactions  . Ace Inhibitors Cough  . Beta Adrenergic Blockers Other (See Comments)    bradycardia  . Fluticasone-Salmeterol     Unable to remember per pt  . Hytrin [Terazosin]     Nasal congestion   . Prednisone     Shakes / tremors  . Vioxx [Rofecoxib] Other (See Comments)    dyspepsia   Past Medical History  Diagnosis Date  . HTN (hypertension)   . Diastolic dysfunction   . Atrial fibrillation   . GERD (gastroesophageal reflux disease)   . Allergic rhinitis   . Diverticulosis of colon     recurrent GI bleeding  . OSA (obstructive sleep apnea)     compliants w/ CPAP  . Hyperplastic colon polyp 2003  . Chronic gastritis   . Positive PPD     remote  . Atrial flutter   . Acute bronchitis   . Anemia     iron deficient  . Shortness of breath   . Venous insufficiency   . Moderate aortic insufficiency   . DJD  (degenerative joint disease)   . COPD (chronic obstructive pulmonary disease)   . Hypothyroidism       Review of Systems  Constitutional: Positive for fatigue.  Respiratory: Positive for shortness of breath.   Cardiovascular: Positive for leg swelling.  All other systems reviewed and are negative.  BP 108/62  Pulse 66  Temp(Src) 97.8 F (36.6 C) (Temporal)  Resp 18  Ht 5\' 9"  (1.753 m)  Wt 258 lb (117.028 kg)  BMI 38.08 kg/m2  SpO2 95%     Objective:   Physical Exam  Nursing note and vitals reviewed. Constitutional: He is oriented to person, place, and time. He appears well-developed and well-nourished.  HENT:  Head: Normocephalic and atraumatic.  Right Ear: External ear normal.  Left Ear: External ear normal.  Nose: Nose normal.  Mouth/Throat: Oropharynx is clear and moist. No oropharyngeal exudate.  Eyes: Conjunctivae are normal.  Neck: Normal range of motion.  Cardiovascular: Normal rate, regular rhythm, normal heart sounds and intact distal pulses.   2 + Bil LE edema  Pulmonary/Chest: Effort normal and breath sounds normal.  Abdominal: Soft.  Musculoskeletal: Normal range of motion.  Lymphadenopathy:    He has no cervical adenopathy.  Neurological: He is alert and oriented to person, place, and time.  Skin: Skin is warm and dry.  Psychiatric: He has a normal mood and affect. Judgment normal.          Assessment & Plan:  1. Abnormal CPK labs s/p fall- recheck  2. ? Fluid build up with d/c lasix per hospital- check labs, restart lasix BID prn and compression stockings with leg elevation. w/c if SX increase or ER.   3. Chronic SOB with COPD/ Exertion-check labs, f/u pulmonologist, w/c if SX increase or ER.

## 2013-11-17 NOTE — Patient Instructions (Signed)
Heart Failure Restart Furosemide and compression socks Heart failure means your heart has trouble pumping blood. This makes it hard for your body to work well. Heart failure is usually a long-term (chronic) condition. You must take good care of yourself and follow your doctor's treatment plan. HOME CARE  Take your heart medicine as told by your doctor.  Do not stop taking medicine unless your doctor tells you to.  Do not skip any dose of medicine.  Refill your medicines before they run out.  Take other medicines only as told by your doctor or pharmacist.  Stay active if told by your doctor. The elderly and people with severe heart failure should talk with a doctor about physical activity.  Eat heart healthy foods. Choose foods that are without trans fat and are low in saturated fat, cholesterol, and salt (sodium). This includes fresh or frozen fruits and vegetables, fish, lean meats, fat-free or low-fat dairy foods, whole grains, and high-fiber foods. Lentils and dried peas and beans (legumes) are also good choices.  Limit salt if told by your doctor.  Cook in a healthy way. Roast, grill, broil, bake, poach, steam, or stir-fry foods.  Limit fluids as told by your doctor.  Weigh yourself every morning. Do this after you pee (urinate) and before you eat breakfast. Write down your weight to give to your doctor.  Take your blood pressure and write it down if your doctor tell you to.  Ask your doctor how to check your pulse. Check your pulse as told.  Lose weight if told by your doctor.  Stop smoking or chewing tobacco. Do not use gum or patches that help you quit without your doctor's approval.  Schedule and go to doctor visits as told.  Nonpregnant women should have no more than 1 drink a day. Men should have no more than 2 drinks a day. Talk to your doctor about drinking alcohol.  Stop illegal drug use.  Stay current with shots (immunizations).  Manage your health conditions  as told by your doctor.  Learn to manage your stress.  Rest when you are tired.  If it is really hot outside:  Avoid intense activities.  Use air conditioning or fans, or get in a cooler place.  Avoid caffeine and alcohol.  Wear loose-fitting, lightweight, and light-colored clothing.  If it is really cold outside:  Avoid intense activities.  Layer your clothing.  Wear mittens or gloves, a hat, and a scarf when going outside.  Avoid alcohol.  Learn about heart failure and get support as needed.  Get help to maintain or improve your quality of life and your ability to care for yourself as needed. GET HELP IF:   You gain 03 lb/1.4 kg or more in 1 day or 05 lb/2.3 kg in a week.  You are more short of breath than usual.  You cannot do your normal activities.  You tire easily.  You cough more than normal, especially with activity.  You have any or more puffiness (swelling) in areas such as your hands, feet, ankles, or belly (abdomen).  You cannot sleep because it is hard to breathe.  You feel like your heart is beating fast (palpitations).  You get dizzy or lightheaded when you stand up. GET HELP RIGHT AWAY IF:   You have trouble breathing.  There is a change in mental status, such as becoming less alert or not being able to focus.  You have chest pain or discomfort.  You faint. MAKE SURE  YOU:   Understand these instructions.  Will watch your condition.  Will get help right away if you are not doing well or get worse. Document Released: 03/18/2008 Document Revised: 10/04/2012 Document Reviewed: 01/08/2012 [facility] Patient Information 2014 [place], [place].

## 2013-11-17 NOTE — Telephone Encounter (Signed)
[place] called. States patient fell and having physical therapy at home.  Patient states he is having right side pain when he takes a deep breath.  Order for chest x-ray with right rib detail sent to [facility] with patient to have office visit here afterward.

## 2013-11-17 NOTE — Telephone Encounter (Signed)
Pt was just in for OV and is scheduled for f/u OV 11-21-13. Does pt need to keep this appt or can he cancel? Please advise.

## 2013-11-18 ENCOUNTER — Other Ambulatory Visit: Payer: Self-pay | Admitting: Emergency Medicine

## 2013-11-18 ENCOUNTER — Other Ambulatory Visit: Payer: Self-pay | Admitting: *Deleted

## 2013-11-18 ENCOUNTER — Ambulatory Visit
Admission: RE | Admit: 2013-11-18 | Discharge: 2013-11-18 | Disposition: A | Discharge status: Home / Self Care | Payer: Medicare Other | Source: Ambulatory Visit | Attending: Emergency Medicine | Admitting: Emergency Medicine

## 2013-11-18 DIAGNOSIS — R0602 Shortness of breath: Secondary | ICD-10-CM

## 2013-11-18 DIAGNOSIS — R7989 Other specified abnormal findings of blood chemistry: Secondary | ICD-10-CM

## 2013-11-18 DIAGNOSIS — R609 Edema, unspecified: Secondary | ICD-10-CM

## 2013-11-18 DIAGNOSIS — E042 Nontoxic multinodular goiter: Secondary | ICD-10-CM

## 2013-11-18 DIAGNOSIS — J9819 Other pulmonary collapse: Secondary | ICD-10-CM | POA: Diagnosis not present

## 2013-11-18 LAB — D-DIMER, QUANTITATIVE: D-Dimer, Quant: 1.23 ug/mL-FEU — ABNORMAL HIGH (ref 0.00–0.48)

## 2013-11-18 LAB — BRAIN NATRIURETIC PEPTIDE: Brain Natriuretic Peptide: 106.8 pg/mL — ABNORMAL HIGH (ref 0.0–100.0)

## 2013-11-18 MED ORDER — HYDROCODONE-ACETAMINOPHEN 5-325 MG PO TABS: 1.0000 | ORAL_TABLET | Freq: Four times a day (QID) | ORAL | Status: DC | PRN

## 2013-11-18 MED ORDER — BENZONATATE 100 MG PO CAPS: 100.0000 mg | ORAL_CAPSULE | Freq: Three times a day (TID) | ORAL | Status: DC | PRN

## 2013-11-18 MED ORDER — IOHEXOL 350 MG/ML SOLN
100.0000 mL | Freq: Once | INTRAVENOUS | Status: AC | PRN
  Administered 2013-11-18: 100 mL via INTRAVENOUS

## 2013-11-21 ENCOUNTER — Ambulatory Visit: Payer: Self-pay | Admitting: Internal Medicine

## 2013-11-21 ENCOUNTER — Other Ambulatory Visit: Payer: Self-pay | Admitting: Emergency Medicine

## 2013-11-22 DIAGNOSIS — I5032 Chronic diastolic (congestive) heart failure: Secondary | ICD-10-CM | POA: Diagnosis not present

## 2013-11-22 DIAGNOSIS — R5383 Other fatigue: Secondary | ICD-10-CM | POA: Diagnosis not present

## 2013-11-22 DIAGNOSIS — G4730 Sleep apnea, unspecified: Secondary | ICD-10-CM | POA: Diagnosis not present

## 2013-11-22 DIAGNOSIS — I4891 Unspecified atrial fibrillation: Secondary | ICD-10-CM | POA: Diagnosis not present

## 2013-11-22 DIAGNOSIS — R627 Adult failure to thrive: Secondary | ICD-10-CM | POA: Diagnosis not present

## 2013-11-22 DIAGNOSIS — R5381 Other malaise: Secondary | ICD-10-CM | POA: Diagnosis not present

## 2013-11-22 DIAGNOSIS — IMO0001 Reserved for inherently not codable concepts without codable children: Secondary | ICD-10-CM | POA: Diagnosis not present

## 2013-11-24 ENCOUNTER — Encounter: Payer: Self-pay | Admitting: Emergency Medicine

## 2013-11-24 ENCOUNTER — Ambulatory Visit (INDEPENDENT_AMBULATORY_CARE_PROVIDER_SITE_OTHER): Payer: Medicare Other | Admitting: Emergency Medicine

## 2013-11-24 VITALS — BP 140/80 | HR 100 | Ht 68.0 in | Wt 258.0 lb

## 2013-11-24 DIAGNOSIS — R5383 Other fatigue: Secondary | ICD-10-CM | POA: Diagnosis not present

## 2013-11-24 DIAGNOSIS — G4733 Obstructive sleep apnea (adult) (pediatric): Secondary | ICD-10-CM

## 2013-11-24 DIAGNOSIS — G4730 Sleep apnea, unspecified: Secondary | ICD-10-CM | POA: Diagnosis not present

## 2013-11-24 DIAGNOSIS — IMO0001 Reserved for inherently not codable concepts without codable children: Secondary | ICD-10-CM | POA: Diagnosis not present

## 2013-11-24 DIAGNOSIS — I4891 Unspecified atrial fibrillation: Secondary | ICD-10-CM | POA: Diagnosis not present

## 2013-11-24 DIAGNOSIS — J309 Allergic rhinitis, unspecified: Secondary | ICD-10-CM | POA: Diagnosis not present

## 2013-11-24 DIAGNOSIS — I5032 Chronic diastolic (congestive) heart failure: Secondary | ICD-10-CM | POA: Diagnosis not present

## 2013-11-24 DIAGNOSIS — J449 Chronic obstructive pulmonary disease, unspecified: Secondary | ICD-10-CM

## 2013-11-24 DIAGNOSIS — R627 Adult failure to thrive: Secondary | ICD-10-CM | POA: Diagnosis not present

## 2013-11-24 DIAGNOSIS — R5381 Other malaise: Secondary | ICD-10-CM | POA: Diagnosis not present

## 2013-11-24 NOTE — Assessment & Plan Note (Signed)
-   continue same inhaled mediations.  - he is wheezing, but has difficulty tolerating pred. He would like to defer pred for now. Will call me if he has trouble - rov 2

## 2013-11-24 NOTE — Assessment & Plan Note (Signed)
Continue same regimen 

## 2013-11-24 NOTE — Patient Instructions (Signed)
Please continue your inhaled medications as you are taking them Wear your CPAP every night Follow with Dr Lamonte Sakai in 2 months or sooner if you have any problems.

## 2013-11-24 NOTE — Progress Notes (Signed)
Subjective:  Patient ID: Richard Rosales, male    DOB: [birth year], 80 y.o.   MRN: [id number]  80 yo man, former smoker(quit'80)  For FU of COPD &  OSA on CPAP 12 cm. He has  hx of HTN, A Fib, chronic diastolic dysfxn and LE edema, GERD, diverticulosis, remote positive PPD.   TTE with preserved LVEF, PASP 34, not able to comment on diastolic fxn. PFTs 5/11  Showed moderate airway obstruction with FEv1 55% & good BD respnse - improved to 67%  09/15/2011 Follow up  Patient returns for a two-week followup visit. Last visit had a COPD exacerbation treated with antibiotics and steroids. Patient has had 2 COPD flares over last couple months. He feels that he is back to his baseline. He denies any chest pain, orthopnea, PND, increased leg swelling, hemoptysis, or fever. Currently taking Symbicort 2 puffs twice daily.  ROV 11/13/11 -- Hx OSA, COPD on Symbicort. Also A flutter, chronic diastolic CHF. He saw TP in March and Spiriva was added to Symbicort, he believes it has helped. He has had coughing for the last 3 weeks, one day had nausea. He has had a lot of mucous and drainage. Still using fluticasone nasal spray, loratadine. Not using [place] right now.   ROV 02/16/12 -- Hx OSA, COPD on Symbicort. Also A flutter, chronic diastolic CHF.  He is off the loratadine and fluticasone, he is using chlorpheniramine q6h. He continues to have paroxysms. He has been on allergy shots in the past, not recently. He is interested in going to Dr Orvil Feil. He tells me that breathing is overall the same. He is still limited in his exertion. Still on Symbicort and spiriva, no pred or abx since last time.  Reliable with CPAP.   ROV 03/22/12 -- Hx OSA, COPD on Symbicort and Spiriva. Also A flutter, chronic diastolic CHF.  He is having more mucous and cough. Reassuring eval by Dr Orvil Feil, better drainage with astelin/fluticasone. He is wondering if Symbicort is bothering his cough.   ROV 08/26/12 -- Hx OSA, COPD on Symbicort and Spiriva.  Also A flutter, chronic diastolic CHF. Chronic rhinitis, has been seen by Dr Orvil Feil. Having cough, nasal congestion and drainage. He is doing [place] bid, no longer on nasal steroid or astelin. He uses chlortrimeton, not currently on loratadine or any anti-histamine. Functional capacity limited by his arthritis. Never uses SABA.   ROV 12/02/12 -- OSA, COPD on Symbicort and Spiriva. Also A flutter, chronic diastolic CHF. Chronic rhinitis, doing NSW's. He restarted fluticasone and allegra. Not currently doing chlortrimeton. No AE's since last time. He has a SABA, has never needed to use. Good compliance w CPAP. No fevers.   Acute OV  Complains of increased SOB, prod cough with clear mucus, some wheezing, throat congestion, head congestion w/ PND  - worse x3 weeks.  denies f/c/s, increased edema. No otc used.  No fever, orthopnea, chest pain, n/v.  Wears CPAP each night.   ROV 03/30/13 -- OSA, COPD on Symbicort and Spiriva. Also A flutter, chronic diastolic CHF. He saw Dr Gwenette Greet 02/25/13 for persistent sx after a COPD flare.  It is very difficult for him to take corticosteroids, gets side effects of tremor, aches, mood swings, etc. He changed allegra to chlorpheniramine, seems to be working better. He is sleeping better, has some wheeze that seems to be UA. His GERD is treated.   ROV 07/08/13 -- OSA, COPD on Symbicort and Spiriva. Also A flutter, chronic diastolic CHF.  Was seen by  [facility] in 12/14 for an AE-COPD. He is having fatigue, tiredness with heavier exertion. He is still having some cough, nasal drainage. He cut his omeprazole to once a day about 1.5 months ago. Just increased back to bid today.   ROV 09/06/13 -- Hx of OSA, COPD, A flutter, chronic diastolic CHF. He is on CPAP, uses Spiriva and Symbicort. He is SOB with exertion. He has not needed his SABA.  No flares. Able to wear CPAP every night. Has an am cough, usually clear.   ROV 11/24/13 -- Hx of OSA, COPD, A flutter, chronic diastolic CHF. He  unfortunately had a fall recently, had to be admitted. A Ct chest was done and found several other findings that need to be evaluated - cardiomegaly, ? Hepatic enlargement / cirrhosis, thyroid nodules. He is having a hacking cough, wakes him up at night, can also happen during the day. He is doing well with his CPAP. He is on spiriva and symbicort. Takes omeprazole bid.    Filed Vitals:   11/24/13 1601  BP: 140/80  Pulse: 100  Height: 5\' 8"  (1.727 m)  Weight: 258 lb (117.028 kg)  SpO2: 94%    Exam:  GEN: A/Ox3; pleasant , NAD,  obese kyphotic.   HEENT:  [place]/AT,  EACs-clear, TMs-wnl, NOSE-clear drainage , THROAT-clear, no lesions, no postnasal drip or exudate noted.   NECK:  Supple w/ fair ROM; no JVD; normal carotid impulses w/o bruits; no thyromegaly or nodules palpated; no lymphadenopathy.  RESP B expiratory wheezes.   CARD:  RRR, no m/r/g  , 1+ peripheral edema, pulses intact, no cyanosis or clubbing.  Musco: Warm bil, no deformities or joint swelling noted.   Neuro: alert, no focal deficits noted.    Skin: Warm, no lesions or rashes    11/18/13 --  COMPARISON: No prior CT chest. Multiple prior chest x-rays, most  recently 11/17/2013.  FINDINGS:  Contrast opacification of the pulmonary arteries is excellent. No  filling defects within either main pulmonary artery or their  branches in either lung to suggest pulmonary embolism. Heart  markedly enlarged with left ventricular predominance and evidence of  left ventricular hypertrophy. Small pericardial effusion. Moderate 3  vessel coronary atherosclerosis. Severe atherosclerosis involving  the thoracic and upper abdominal aorta and their visualized branches  without evidence of aneurysm. Tortuous thoracic and upper abdominal  aorta.  Emphysematous changes throughout both lungs. Subsegmental airspace  consolidation with air bronchograms in the deep posterior right  lower lobe. Lungs otherwise clear. No pulmonary parenchymal  nodules  or masses. No pleural effusions.  Thyroid gland enlargement with multiple nodules, the largest nodules  adjacent to one another in the right lobe measuring approximately  2.5 x 2.2 x 3.1 cm in the mid pole and 3.0 x 3.0 x 2.1 cm in the  lower pole. Scattered normal size lymph nodes in the mediastinum,  hila, and axilla without significant lymphadenopathy. Mild bilateral  gynecomastia.  Irregularity involving the contour of the visualized liver with  relative enlargement of the left lobe. Cysts involving the mid  portions of the visualized kidneys. Moderate pancreatic atrophy.  Hiatal hernia containing predominately intra-abdominal fat.  Diverticulosis involving the visualized distal transverse and  proximal descending colon without evidence of acute diverticulitis.  Bone window images demonstrated diffuse thoracic spondylosis and  exaggeration of the usual thoracic kyphosis.  Review of the MIP images confirms the above findings.   IMPRESSION:  1. No evidence of pulmonary embolism.  2. COPD/emphysema. Atelectasis involving the right  lower lobe. No  acute cardiopulmonary disease otherwise.  3. Multinodular goiter, with adjacent 3 cm nodules in the right  lobe.  4. Hepatic cirrhosis.  5. Moderate pancreatic atrophy.  6. Diverticulosis involving the visualized distal transverse and  proximal descending colon.  7. Cardiomegaly with 3 vessel coronary artery disease. Left  ventricular hypertrophy. Small pericardial effusion.  8. Mild bilateral gynecomastia   Assessment & Plan:   ALLERGIC RHINITIS Continue same regimen  COPD - continue same inhaled mediations.  - he is wheezing, but has difficulty tolerating pred. He would like to defer pred for now. Will call me if he has trouble - rov 2  SLEEP APNEA, OBSTRUCTIVE - continue CPAP

## 2013-11-24 NOTE — Assessment & Plan Note (Signed)
--  continue CPAP

## 2013-11-27 DIAGNOSIS — I5032 Chronic diastolic (congestive) heart failure: Secondary | ICD-10-CM | POA: Diagnosis not present

## 2013-11-27 DIAGNOSIS — R627 Adult failure to thrive: Secondary | ICD-10-CM | POA: Diagnosis not present

## 2013-11-27 DIAGNOSIS — R5383 Other fatigue: Secondary | ICD-10-CM | POA: Diagnosis not present

## 2013-11-27 DIAGNOSIS — G4730 Sleep apnea, unspecified: Secondary | ICD-10-CM | POA: Diagnosis not present

## 2013-11-27 DIAGNOSIS — R5381 Other malaise: Secondary | ICD-10-CM | POA: Diagnosis not present

## 2013-11-27 DIAGNOSIS — IMO0001 Reserved for inherently not codable concepts without codable children: Secondary | ICD-10-CM | POA: Diagnosis not present

## 2013-11-27 DIAGNOSIS — I4891 Unspecified atrial fibrillation: Secondary | ICD-10-CM | POA: Diagnosis not present

## 2013-11-28 ENCOUNTER — Encounter: Payer: Self-pay | Admitting: Nurse Practitioner

## 2013-11-28 ENCOUNTER — Ambulatory Visit (INDEPENDENT_AMBULATORY_CARE_PROVIDER_SITE_OTHER): Payer: Medicare Other | Admitting: Nurse Practitioner

## 2013-11-28 VITALS — BP 130/80 | HR 86 | Ht 68.5 in | Wt 253.8 lb

## 2013-11-28 DIAGNOSIS — I5032 Chronic diastolic (congestive) heart failure: Secondary | ICD-10-CM | POA: Diagnosis not present

## 2013-11-28 DIAGNOSIS — I4891 Unspecified atrial fibrillation: Secondary | ICD-10-CM

## 2013-11-28 DIAGNOSIS — W19XXXA Unspecified fall, initial encounter: Secondary | ICD-10-CM

## 2013-11-28 DIAGNOSIS — I10 Essential (primary) hypertension: Secondary | ICD-10-CM | POA: Diagnosis not present

## 2013-11-28 NOTE — Patient Instructions (Signed)
We need to get an echocardiogram in September  See Dr. Rayann Heman in September  Stay on your current medicines  Call the [place] office at [phone] if you have any questions, problems or concerns.

## 2013-11-28 NOTE — Progress Notes (Signed)
Richard Rosales Date of Birth: [birth year] Medical Record #[id number]  History of Present Illness: Richard Rosales is seen back today for a follow up/post hospital visit. Seen for Dr. Rayann Heman.   He has multiple medical problems. These include HTN, chronic diastolic heart failure, OSA, atrial fibrillation, COPD, and moderate aortic insufficiency. He was last seen in our office last September. He seemed to be stable from a cardiac standpoint at that time. He has declined anti-coagulation repeatedly.  Most recently he was admitted after a fall and was unable to get up. He apparently was trying to walk and his leg gave away. He did not have syncope. His wife found him on the floor and called 911. In the emergency room, he had a negative CT scan and a nonfocal physical exam. An elevated lactic acid level was treated with IV fluids. He was recommended to have home health services at discharge. He remained in atrial fibrillation.  He comes in today. He is here alone. Now getting PT which has helped him make progress. Still unsteady on his feet. Did not pass out. No chest pain. Breathing is stable for him - saw pulmonary recently. Only on aspirin 3 times a week.  Current Outpatient Prescriptions  Medication Sig Dispense Refill  . acetaminophen-codeine (TYLENOL #3) 300-30 MG per tablet Take 1 tablet by mouth every 4 (four) hours as needed for moderate pain.       Marland Kitchen albuterol (PROAIR HFA) 108 (90 BASE) MCG/ACT inhaler Inhale 1 puff into the lungs every 6 (six) hours as needed for wheezing or shortness of breath.  1 Inhaler  3  . aspirin 81 MG tablet Take 81 mg by mouth. 3 days per week      . azelastine (ASTELIN) 137 MCG/SPRAY nasal spray Place 1 spray into the nose at bedtime. Use in each nostril as directed      . b complex vitamins tablet Take 1 tablet by mouth every morning.       . budesonide-formoterol (SYMBICORT) 160-4.5 MCG/ACT inhaler Inhale 2 puffs into the lungs 2 (two) times daily.  3 Inhaler  2  .  chlorpheniramine (CHLOR-TRIMETON) 4 MG tablet Take 4 mg by mouth every evening.      . Cholecalciferol (VITAMIN D) 2000 UNITS CAPS Take 1 capsule by mouth daily.       Marland Kitchen diltiazem (DILACOR XR) 240 MG 24 hr capsule Take 240 mg by mouth 2 (two) times daily.      Marland Kitchen docusate sodium (COLACE) 100 MG capsule Take 100 mg by mouth 2 (two) times daily.        . fish oil-omega-3 fatty acids 1000 MG capsule Take 1 g by mouth 2 (two) times daily.        . furosemide (LASIX) 40 MG tablet Take 40 mg by mouth daily.       . Glucosamine HCl 1000 MG TABS Take 1 tablet by mouth daily.        Marland Kitchen guaifenesin (HUMIBID E) 400 MG TABS Take 400 mg by mouth 2 (two) times daily.        Marland Kitchen HYDROcodone-acetaminophen (NORCO/VICODIN) 5-325 MG per tablet Take 1 tablet by mouth every 6 (six) hours as needed for moderate pain (only takes for testing for relaxation).      Marland Kitchen levothyroxine (SYNTHROID, LEVOTHROID) 100 MCG tablet Take 100 mcg by mouth daily before breakfast.      . losartan (COZAAR) 100 MG tablet Take 1 tablet (100 mg total) by mouth daily.  [place]  tablet  4  . minoxidil (LONITEN) 2.5 MG tablet Take 5 mg by mouth every evening.      Marland Kitchen omeprazole (PRILOSEC) 40 MG capsule Take 40 mg by mouth 2 (two) times daily.      Marland Kitchen PROVIGIL 200 MG tablet Take 100 mg by mouth daily as needed. 1/2 tab by mouth on Wed and Sun when pt drives      . Psyllium (METAMUCIL) 28.3 % POWD Take 1 scoop by mouth 2 (two) times daily. 1 scoop twice daily      . tiotropium (SPIRIVA) 18 MCG inhalation capsule Place 18 mcg into inhaler and inhale daily.       No current facility-administered medications for this visit.    Allergies  Allergen Reactions  . Ace Inhibitors Cough  . Beta Adrenergic Blockers Other (See Comments)    bradycardia  . Fluticasone-Salmeterol     Unable to remember per pt  . Hytrin [Terazosin]     Nasal congestion   . Prednisone     Shakes / tremors  . Vioxx [Rofecoxib] Other (See Comments)    dyspepsia    Past Medical  History  Diagnosis Date  . HTN (hypertension)   . Diastolic dysfunction   . Atrial fibrillation   . GERD (gastroesophageal reflux disease)   . Allergic rhinitis   . Diverticulosis of colon     recurrent GI bleeding  . OSA (obstructive sleep apnea)     compliants w/ CPAP  . Hyperplastic colon polyp 2003  . Chronic gastritis   . Positive PPD     remote  . Atrial flutter   . Acute bronchitis   . Anemia     iron deficient  . Shortness of breath   . Venous insufficiency   . Moderate aortic insufficiency   . DJD (degenerative joint disease)   . COPD (chronic obstructive pulmonary disease)   . Hypothyroidism     Past Surgical History  Procedure Laterality Date  . Cardiac catheterization  7.27.05    revealed a preserved EF with no CAD  . Tonsillectomy    . Total knee arthroplasty      bilateral  . Nasal sinus surgery    . Colonoscopy    . Joint replacement  2001    lt total knee-rt one done 00  . Hernia repair      bilateral  . Hernia repair    . Transurethral resection of prostate      History  Smoking status  . Former Smoker -- 1.00 packs/day for 20 years  . Types: Cigarettes  . Quit date: 08/01/1978  Smokeless tobacco  . Never Used    History  Alcohol Use  . Yes    Comment: about 3 to 4 times a week    Family History  Problem Relation Age of Onset  . Hypertension Mother   . Diabetes Father   . Lung cancer Brother     chemical    Review of Systems: The review of systems is per the HPI.  All other systems were reviewed and are negative.  Physical Exam: BP 130/80  Pulse 86  Ht 5' 8.5" (1.74 m)  Wt 253 lb 12.8 oz (115.123 kg)  BMI 38.02 kg/m2  SpO2 95% Patient is very pleasant and in no acute distress. Skin is warm and dry. Color is normal.  HEENT is unremarkable. Normocephalic/atraumatic. PERRL. Sclera are nonicteric. Neck is supple. No masses. No JVD. Lungs are coarse with scattered wheezes. Cardiac exam shows  an irregular rhythm. Rate is ok. Soft  murmur.  Abdomen is soft. Extremities are without edema. Gait and ROM are intact but he is using a walker. No gross neurologic deficits noted.  LABORATORY DATA: Lab Results  Component Value Date   WBC 8.4 11/17/2013   HGB 14.3 11/17/2013   HCT 42.2 11/17/2013   PLT 236 11/17/2013   GLUCOSE 92 11/17/2013   CHOL 125 09/14/2013   TRIG 76 09/14/2013   HDL 46 09/14/2013   LDLCALC 64 09/14/2013   ALT 20 09/14/2013   AST 25 09/14/2013   NA 138 11/17/2013   K 4.1 11/17/2013   CL 100 11/17/2013   CREATININE 0.75 11/17/2013   BUN 11 11/17/2013   CO2 27 11/17/2013   TSH 3.445 09/14/2013   INR 1.11 10/03/2010   HGBA1C 5.9* 09/14/2013   MICROALBUR 1.22 06/13/2013     Assessment / Plan: #1. Fall - now getting [place] [place] PT - sees his PCP later this month  #2. Chronic atrial fibrillation - not a candidate for coumadin now with recent fall and unsteady gait - rate is ok - he is asymptomatic.  #3. Hypertension - Bp ok today  #4. Chronic diastolic HF  5. Aortic valve disease - needs his echo updated.  Patient is agreeable to this plan and will call if any problems develop in the interim.   Burtis Junes, RN, [facility] [address] [place], [place]  [phone]

## 2013-11-29 DIAGNOSIS — R5383 Other fatigue: Secondary | ICD-10-CM | POA: Diagnosis not present

## 2013-11-29 DIAGNOSIS — I5032 Chronic diastolic (congestive) heart failure: Secondary | ICD-10-CM | POA: Diagnosis not present

## 2013-11-29 DIAGNOSIS — I4891 Unspecified atrial fibrillation: Secondary | ICD-10-CM | POA: Diagnosis not present

## 2013-11-29 DIAGNOSIS — G4730 Sleep apnea, unspecified: Secondary | ICD-10-CM | POA: Diagnosis not present

## 2013-11-29 DIAGNOSIS — R627 Adult failure to thrive: Secondary | ICD-10-CM | POA: Diagnosis not present

## 2013-11-29 DIAGNOSIS — R5381 Other malaise: Secondary | ICD-10-CM | POA: Diagnosis not present

## 2013-11-29 DIAGNOSIS — IMO0001 Reserved for inherently not codable concepts without codable children: Secondary | ICD-10-CM | POA: Diagnosis not present

## 2013-12-07 DIAGNOSIS — I5032 Chronic diastolic (congestive) heart failure: Secondary | ICD-10-CM | POA: Diagnosis not present

## 2013-12-07 DIAGNOSIS — R627 Adult failure to thrive: Secondary | ICD-10-CM | POA: Diagnosis not present

## 2013-12-07 DIAGNOSIS — G4730 Sleep apnea, unspecified: Secondary | ICD-10-CM | POA: Diagnosis not present

## 2013-12-07 DIAGNOSIS — IMO0001 Reserved for inherently not codable concepts without codable children: Secondary | ICD-10-CM | POA: Diagnosis not present

## 2013-12-07 DIAGNOSIS — R5381 Other malaise: Secondary | ICD-10-CM | POA: Diagnosis not present

## 2013-12-07 DIAGNOSIS — I4891 Unspecified atrial fibrillation: Secondary | ICD-10-CM | POA: Diagnosis not present

## 2013-12-09 DIAGNOSIS — I4891 Unspecified atrial fibrillation: Secondary | ICD-10-CM | POA: Diagnosis not present

## 2013-12-09 DIAGNOSIS — IMO0001 Reserved for inherently not codable concepts without codable children: Secondary | ICD-10-CM | POA: Diagnosis not present

## 2013-12-09 DIAGNOSIS — I5032 Chronic diastolic (congestive) heart failure: Secondary | ICD-10-CM | POA: Diagnosis not present

## 2013-12-09 DIAGNOSIS — G4730 Sleep apnea, unspecified: Secondary | ICD-10-CM | POA: Diagnosis not present

## 2013-12-09 DIAGNOSIS — R5381 Other malaise: Secondary | ICD-10-CM | POA: Diagnosis not present

## 2013-12-09 DIAGNOSIS — R627 Adult failure to thrive: Secondary | ICD-10-CM | POA: Diagnosis not present

## 2013-12-13 DIAGNOSIS — G4730 Sleep apnea, unspecified: Secondary | ICD-10-CM | POA: Diagnosis not present

## 2013-12-13 DIAGNOSIS — I5032 Chronic diastolic (congestive) heart failure: Secondary | ICD-10-CM | POA: Diagnosis not present

## 2013-12-13 DIAGNOSIS — I4891 Unspecified atrial fibrillation: Secondary | ICD-10-CM | POA: Diagnosis not present

## 2013-12-13 DIAGNOSIS — IMO0001 Reserved for inherently not codable concepts without codable children: Secondary | ICD-10-CM | POA: Diagnosis not present

## 2013-12-13 DIAGNOSIS — R5381 Other malaise: Secondary | ICD-10-CM | POA: Diagnosis not present

## 2013-12-13 DIAGNOSIS — R627 Adult failure to thrive: Secondary | ICD-10-CM | POA: Diagnosis not present

## 2013-12-13 DIAGNOSIS — R5383 Other fatigue: Secondary | ICD-10-CM | POA: Diagnosis not present

## 2013-12-14 ENCOUNTER — Ambulatory Visit: Payer: Self-pay | Admitting: Internal Medicine

## 2013-12-15 ENCOUNTER — Ambulatory Visit (INDEPENDENT_AMBULATORY_CARE_PROVIDER_SITE_OTHER): Payer: Medicare Other | Admitting: Internal Medicine

## 2013-12-15 ENCOUNTER — Encounter: Payer: Self-pay | Admitting: Internal Medicine

## 2013-12-15 VITALS — BP 144/76 | HR 68 | Temp 99.3°F | Resp 18 | Ht 69.0 in | Wt 255.8 lb

## 2013-12-15 DIAGNOSIS — R5381 Other malaise: Secondary | ICD-10-CM | POA: Diagnosis not present

## 2013-12-15 DIAGNOSIS — E559 Vitamin D deficiency, unspecified: Secondary | ICD-10-CM | POA: Insufficient documentation

## 2013-12-15 DIAGNOSIS — Z79899 Other long term (current) drug therapy: Secondary | ICD-10-CM | POA: Diagnosis not present

## 2013-12-15 DIAGNOSIS — I4891 Unspecified atrial fibrillation: Secondary | ICD-10-CM | POA: Diagnosis not present

## 2013-12-15 DIAGNOSIS — I10 Essential (primary) hypertension: Secondary | ICD-10-CM

## 2013-12-15 DIAGNOSIS — I5032 Chronic diastolic (congestive) heart failure: Secondary | ICD-10-CM | POA: Diagnosis not present

## 2013-12-15 DIAGNOSIS — IMO0001 Reserved for inherently not codable concepts without codable children: Secondary | ICD-10-CM | POA: Diagnosis not present

## 2013-12-15 DIAGNOSIS — R627 Adult failure to thrive: Secondary | ICD-10-CM | POA: Diagnosis not present

## 2013-12-15 DIAGNOSIS — R7303 Prediabetes: Secondary | ICD-10-CM | POA: Insufficient documentation

## 2013-12-15 DIAGNOSIS — E782 Mixed hyperlipidemia: Secondary | ICD-10-CM | POA: Diagnosis not present

## 2013-12-15 DIAGNOSIS — R5383 Other fatigue: Secondary | ICD-10-CM | POA: Diagnosis not present

## 2013-12-15 DIAGNOSIS — R7309 Other abnormal glucose: Secondary | ICD-10-CM

## 2013-12-15 DIAGNOSIS — G4730 Sleep apnea, unspecified: Secondary | ICD-10-CM | POA: Diagnosis not present

## 2013-12-15 NOTE — Patient Instructions (Signed)

## 2013-12-15 NOTE — Progress Notes (Signed)
Patient ID: Richard Rosales, male   DOB: [birth year], 80 y.o.   MRN: [id number]   This very nice 80 y.o.male presents for 3 month follow up with Hypertension, Hyperlipidemia, Pre-Diabetes and Vitamin D Deficiency.    HTN predates since 1998. BP has been controlled at home. Today's BP: 144/76 mmHg. Patient does have Hx/o a negative Heart Cath in 2008. He also has ch Afib and refuses anticoagulation with coumadin or NOAC's because of Hx/o severe diverticular bleeding on coumadin in the past.  Patient denies any cardiac type chest pain, palpitations, orthopnea/PND, dizziness, claudication, or dependent edema. He does have DOE due to his severe Obesity and deconditioning. Of worthy note he relate an approx 50# weight loss over the last 6-9 months.   In May he had a negative CXR and Head CTscan after a fall and has been receiving home PT due to the difficulty of travel outside the home because of balance issues and risk of falls. He doe s relate he feels that the PT has significantly helped improve his balance and gait. More recently he had a chest CT angiogram to r/o PE and it showed Hepatic cirrhosis, A pericardial effusion, and thyroid nodules. Cardiology consult was requested re: the Pericardial effusion and cardiomegaly and Gi Consult was requested re: his apparent cirrhosis. Also Thyroid scan is ordered for further Thyroid evaluation.   Hyperlipidemia is controlled with diet & meds. Patient denies myalgias or other med SE's. Last Lipids in Mar 2015 were at goal.  Lab Results  Component Value Date   CHOL 125 09/14/2013   HDL 46 09/14/2013   LDLCALC 64 09/14/2013   TRIG 76 09/14/2013   CHOLHDL 2.7 09/14/2013    Also, the patient has history of Morbid Obesity (BMI 37.8) an consequent T2_NIDDM  since Dec 2012 with A1c 6.4%    and last A1c was 5.9% in Mar 2015. Patient denies any symptoms of reactive hypoglycemia, diabetic polys, paresthesias or visual blurring.   Further, Patient has history of Vitamin D  Deficiency   and last vitamin D was   . Patient supplements vitamin D without any suspected side-effects.    Medication List   acetaminophen-codeine 300-30 MG per tablet  Commonly known as:  TYLENOL #3  Take 1 tablet by mouth every 4 (four) hours as needed for moderate pain.     albuterol 108 (90 BASE) MCG/ACT inhaler  Commonly known as:  PROAIR HFA  Inhale 1 puff into the lungs every 6 (six) hours as needed for wheezing or shortness of breath.     aspirin 81 MG tablet  Take 81 mg by mouth. 3 days per week     azelastine 0.1 % nasal spray  Commonly known as:  ASTELIN  Place 1 spray into the nose at bedtime. Use in each nostril as directed     b complex vitamins tablet  Take 1 tablet by mouth every morning.     budesonide-formoterol 160-4.5 MCG/ACT inhaler  Commonly known as:  SYMBICORT  Inhale 2 puffs into the lungs 2 (two) times daily.     chlorpheniramine 4 MG tablet  Commonly known as:  CHLOR-TRIMETON  Take 4 mg by mouth every evening.     diltiazem 240 MG 24 hr capsule  Commonly known as:  DILACOR XR  Take 240 mg by mouth 2 (two) times daily.     docusate sodium 100 MG capsule  Commonly known as:  COLACE  Take 100 mg by mouth 2 (two) times daily.  fish oil-omega-3 fatty acids 1000 MG capsule  Take 1 g by mouth 2 (two) times daily.     furosemide 40 MG tablet  Commonly known as:  LASIX  Take 40 mg by mouth daily.     Glucosamine HCl 1000 MG Tabs  Take 1 tablet by mouth daily.     guaifenesin 400 MG Tabs tablet  Commonly known as:  HUMIBID E  Take 400 mg by mouth 2 (two) times daily.     HYDROcodone-acetaminophen 5-325 MG per tablet  Commonly known as:  NORCO/VICODIN  Take 1 tablet by mouth every 6 (six) hours as needed for moderate pain (only takes for testing for relaxation).     levothyroxine 100 MCG tablet  Commonly known as:  SYNTHROID, LEVOTHROID  Take 100 mcg by mouth daily before breakfast.     losartan 100 MG tablet  Commonly known as:   COZAAR  Take 1 tablet (100 mg total) by mouth daily.     METAMUCIL 28.3 % Powd  Generic drug:  Psyllium  Take 1 scoop by mouth 2 (two) times daily. 1 scoop twice daily     minoxidil 2.5 MG tablet  Commonly known as:  LONITEN  Take 2.5 mg by mouth every evening.     omeprazole 40 MG capsule  Commonly known as:  PRILOSEC  Take 40 mg by mouth 2 (two) times daily.     PROVIGIL 200 MG tablet  Generic drug:  modafinil  Take 100 mg by mouth daily as needed. 1/2 tab by mouth on Wed and Sun when pt drives     tiotropium 18 MCG inhalation capsule  Commonly known as:  SPIRIVA  Place 18 mcg into inhaler and inhale daily.     Vitamin D 2000 UNITS Caps  Take 1 capsule by mouth daily.       Allergies  Allergen Reactions  . Ace Inhibitors Cough  . Beta Adrenergic Blockers Other (See Comments)    bradycardia  . Fluticasone-Salmeterol     Unable to remember per pt  . Hytrin [Terazosin]     Nasal congestion   . Prednisone     Shakes / tremors  . Vioxx [Rofecoxib] Other (See Comments)    dyspepsia   PMHx:   Past Medical History  Diagnosis Date  . HTN (hypertension)   . Diastolic dysfunction   . Atrial fibrillation   . GERD (gastroesophageal reflux disease)   . Allergic rhinitis   . Diverticulosis of colon     recurrent GI bleeding  . OSA (obstructive sleep apnea)     compliants w/ CPAP  . Hyperplastic colon polyp 2003  . Chronic gastritis   . Positive PPD     remote  . Atrial flutter   . Acute bronchitis   . Anemia     iron deficient  . Shortness of breath   . Venous insufficiency   . Moderate aortic insufficiency   . DJD (degenerative joint disease)   . COPD (chronic obstructive pulmonary disease)   . Hypothyroidism    FHx:    Reviewed / unchanged  SHx:    Reviewed / unchanged  Systems Review:  Constitutional: Denies fever, chills, wt changes, headaches, insomnia, fatigue, night sweats, change in appetite. Eyes: Denies redness, blurred vision, diplopia,  discharge, itchy, watery eyes.  ENT: Denies discharge, congestion, post nasal drip, epistaxis, sore throat, earache, hearing loss, dental pain, tinnitus, vertigo, sinus pain, snoring.  CV: Denies chest pain, palpitations, irregular heartbeat, syncope, dyspnea, diaphoresis, orthopnea, PND,  claudication or edema. Respiratory: denies cough, dyspnea, DOE, pleurisy, hoarseness, laryngitis, wheezing.  Gastrointestinal: Denies dysphagia, odynophagia, heartburn, reflux, water brash, abdominal pain or cramps, nausea, vomiting, bloating, diarrhea, constipation, hematemesis, melena, hematochezia  or hemorrhoids. Genitourinary: Denies dysuria, frequency, urgency, nocturia, hesitancy, discharge, hematuria or flank pain. Musculoskeletal: Denies arthralgias, myalgias, stiffness, jt. swelling, pain, limping or strain/sprain.  Skin: Denies pruritus, rash, hives, warts, acne, eczema or change in skin lesion(s). Neuro: No weakness, tremor, incoordination, spasms, paresthesia or pain. Psychiatric: Denies confusion, memory loss or sensory loss. Endo: Denies change in weight, skin or hair change.  Heme/Lymph: No excessive bleeding, bruising or enlarged lymph nodes.  Exam:  BP 144/76  Pulse 68  Temp 99.3 F   Resp 18  Ht 5\' 9"    Wt 255 lb 12.8 oz   BMI 37.76 kg/m2  Appears well nourished and in no distress. Eyes: PERRLA, EOMs, conjunctiva no swelling or erythema. Sinuses: No frontal/maxillary tenderness ENT/Mouth: EAC's clear, TM's nl w/o erythema, bulging. Nares clear w/o erythema, swelling, exudates. Oropharynx clear without erythema or exudates. Oral hygiene is good. Tongue normal, non obstructing. Hearing intact.  Neck: Supple. Thyroid nl. Car 2+/2+ without bruits, nodes or JVD. Chest: Respirations nl with BS clear & equal w/o rales, rhonchi, wheezing or stridor.  Cor: Heart sounds normal w/ regular rate and rhythm without sig. murmurs, gallops, clicks, or rubs. Peripheral pulses normal and equal  without  edema.  Abdomen: Soft & bowel sounds normal. Non-tender w/o guarding, rebound, hernias, masses, or organomegaly.  Lymphatics: Unremarkable.  Musculoskeletal: Full ROM all peripheral extremities, joint stability, 5/5 strength, and normal gait.  Skin: Warm, dry without exposed rashes, lesions or ecchymosis apparent.  Neuro: Cranial nerves intact, reflexes equal bilaterally. Sensory-motor testing grossly intact. Tendon reflexes grossly intact.  Pysch: Alert & oriented x 3. Insight and judgement nl & appropriate. No ideations.  Assessment and Plan:  1. Hypertension - Continue monitor blood pressure at home. Continue diet/meds same.  2. Hyperlipidemia - Continue diet/meds, exercise,& lifestyle modifications. Continue monitor periodic cholesterol/liver & renal functions   3. T2_NIDDM, Diet controlled  - continue recommend prudent low glycemic diet, weight loss, regular exercise, diabetic monitoring and periodic eye exams.  4. Vitamin D Deficiency - Continue supplementation.  5. Morbid Obesity  6. Cirrhosis due to  NASH Recommended regular exercise, BP monitoring, continued weight loss, and discussed med and SE's. Recommended labs to assess and monitor clinical status. Further disposition pending results of labs.

## 2013-12-16 LAB — HEPATIC FUNCTION PANEL
ALT: 20 U/L (ref 0–53)
AST: 24 U/L (ref 0–37)
Albumin: 4 g/dL (ref 3.5–5.2)
Alkaline Phosphatase: 79 U/L (ref 39–117)
BILIRUBIN INDIRECT: 0.3 mg/dL (ref 0.2–1.2)
Bilirubin, Direct: 0.1 mg/dL (ref 0.0–0.3)
TOTAL PROTEIN: 6.2 g/dL (ref 6.0–8.3)
Total Bilirubin: 0.4 mg/dL (ref 0.2–1.2)

## 2013-12-16 LAB — LIPID PANEL
CHOLESTEROL: 135 mg/dL (ref 0–200)
HDL: 41 mg/dL (ref >39)
LDL Cholesterol: 74 mg/dL (ref 0–99)
Total CHOL/HDL Ratio: 3.3 Ratio
Triglycerides: 100 mg/dL (ref <150)
VLDL: 20 mg/dL (ref 0–40)

## 2013-12-16 LAB — HEMOGLOBIN A1C
HEMOGLOBIN A1C: 5.7 % — AB (ref <5.7)
Mean Plasma Glucose: 117 mg/dL — ABNORMAL HIGH (ref <117)

## 2013-12-16 LAB — CBC WITH DIFFERENTIAL/PLATELET
BASOS PCT: 1 % (ref 0–1)
Basophils Absolute: 0.1 10*3/uL (ref 0.0–0.1)
EOS ABS: 0.5 10*3/uL (ref 0.0–0.7)
Eosinophils Relative: 7 % — ABNORMAL HIGH (ref 0–5)
HEMATOCRIT: 42.7 % (ref 39.0–52.0)
Hemoglobin: 14.2 g/dL (ref 13.0–17.0)
Lymphocytes Relative: 26 % (ref 12–46)
Lymphs Abs: 2 10*3/uL (ref 0.7–4.0)
MCH: 29.9 pg (ref 26.0–34.0)
MCHC: 33.3 g/dL (ref 30.0–36.0)
MCV: 89.9 fL (ref 78.0–100.0)
MONO ABS: 0.8 10*3/uL (ref 0.1–1.0)
Monocytes Relative: 11 % (ref 3–12)
Neutro Abs: 4.1 10*3/uL (ref 1.7–7.7)
Neutrophils Relative %: 55 % (ref 43–77)
Platelets: 197 10*3/uL (ref 150–400)
RBC: 4.75 MIL/uL (ref 4.22–5.81)
RDW: 12.9 % (ref 11.5–15.5)
WBC: 7.5 10*3/uL (ref 4.0–10.5)

## 2013-12-16 LAB — BASIC METABOLIC PANEL WITH GFR
BUN: 15 mg/dL (ref 6–23)
CALCIUM: 9 mg/dL (ref 8.4–10.5)
CO2: 28 mEq/L (ref 19–32)
Chloride: 101 mEq/L (ref 96–112)
Creat: 0.83 mg/dL (ref 0.50–1.35)
GFR, Est Non African American: 83 mL/min
GLUCOSE: 102 mg/dL — AB (ref 70–99)
POTASSIUM: 4.4 meq/L (ref 3.5–5.3)
Sodium: 137 mEq/L (ref 135–145)

## 2013-12-16 LAB — VITAMIN D 25 HYDROXY (VIT D DEFICIENCY, FRACTURES): Vit D, 25-Hydroxy: 57 ng/mL (ref 30–89)

## 2013-12-16 LAB — MAGNESIUM: Magnesium: 1.9 mg/dL (ref 1.5–2.5)

## 2013-12-16 LAB — TSH: TSH: 3.48 u[IU]/mL (ref 0.350–4.500)

## 2013-12-16 LAB — INSULIN, FASTING: INSULIN FASTING, SERUM: 21 u[IU]/mL (ref 3–28)

## 2013-12-19 DIAGNOSIS — G4730 Sleep apnea, unspecified: Secondary | ICD-10-CM | POA: Diagnosis not present

## 2013-12-19 DIAGNOSIS — R5383 Other fatigue: Secondary | ICD-10-CM | POA: Diagnosis not present

## 2013-12-19 DIAGNOSIS — I5032 Chronic diastolic (congestive) heart failure: Secondary | ICD-10-CM | POA: Diagnosis not present

## 2013-12-19 DIAGNOSIS — R627 Adult failure to thrive: Secondary | ICD-10-CM | POA: Diagnosis not present

## 2013-12-19 DIAGNOSIS — I4891 Unspecified atrial fibrillation: Secondary | ICD-10-CM | POA: Diagnosis not present

## 2013-12-19 DIAGNOSIS — IMO0001 Reserved for inherently not codable concepts without codable children: Secondary | ICD-10-CM | POA: Diagnosis not present

## 2013-12-19 DIAGNOSIS — R5381 Other malaise: Secondary | ICD-10-CM | POA: Diagnosis not present

## 2013-12-21 DIAGNOSIS — I5032 Chronic diastolic (congestive) heart failure: Secondary | ICD-10-CM | POA: Diagnosis not present

## 2013-12-21 DIAGNOSIS — R627 Adult failure to thrive: Secondary | ICD-10-CM | POA: Diagnosis not present

## 2013-12-21 DIAGNOSIS — G4730 Sleep apnea, unspecified: Secondary | ICD-10-CM | POA: Diagnosis not present

## 2013-12-21 DIAGNOSIS — R5381 Other malaise: Secondary | ICD-10-CM | POA: Diagnosis not present

## 2013-12-21 DIAGNOSIS — I4891 Unspecified atrial fibrillation: Secondary | ICD-10-CM | POA: Diagnosis not present

## 2013-12-21 DIAGNOSIS — IMO0001 Reserved for inherently not codable concepts without codable children: Secondary | ICD-10-CM | POA: Diagnosis not present

## 2013-12-23 ENCOUNTER — Other Ambulatory Visit: Payer: Self-pay | Admitting: Internal Medicine

## 2013-12-27 ENCOUNTER — Telehealth: Payer: Self-pay | Admitting: *Deleted

## 2013-12-27 ENCOUNTER — Telehealth: Payer: Self-pay | Admitting: Emergency Medicine

## 2013-12-27 MED ORDER — BUDESONIDE-FORMOTEROL FUMARATE 160-4.5 MCG/ACT IN AERO: 2.0000 | INHALATION_SPRAY | Freq: Two times a day (BID) | RESPIRATORY_TRACT | Status: DC

## 2013-12-27 NOTE — Telephone Encounter (Signed)
Called and spoke to pt. Pt stated he needed Symbicort 90 supply sent in to [facility]. Rx sent. Pt verbalized understanding and denied any further questions or concerns at this time.

## 2013-12-27 NOTE — Telephone Encounter (Signed)
Patient was told not to drive at  discharge from hospital on 11/10/2013.  Patient has not received any paperwork from the state of [place] regarding  not driving, so Dr Melford Aase advised OK for patient to resume driving.

## 2013-12-30 ENCOUNTER — Ambulatory Visit (INDEPENDENT_AMBULATORY_CARE_PROVIDER_SITE_OTHER): Payer: Medicare Other | Admitting: Gastroenterology

## 2013-12-30 ENCOUNTER — Ambulatory Visit: Payer: Medicare Other | Admitting: Gastroenterology

## 2013-12-30 ENCOUNTER — Encounter: Payer: Self-pay | Admitting: Gastroenterology

## 2013-12-30 VITALS — BP 120/78 | HR 88 | Ht 67.0 in | Wt 250.1 lb

## 2013-12-30 DIAGNOSIS — K5731 Diverticulosis of large intestine without perforation or abscess with bleeding: Secondary | ICD-10-CM | POA: Diagnosis not present

## 2013-12-30 DIAGNOSIS — Z86010 Personal history of colonic polyps: Secondary | ICD-10-CM | POA: Diagnosis not present

## 2013-12-30 NOTE — Progress Notes (Signed)
_                                                                                                                History of Present Illness: Mr. Boruff has returned for followup of colon polyps.  Several adenomatous polyps were removed in 2010.  He's had no GI complaints since his last visit in 2012.  Specifically, he's had no evidence for recurrent overt GI bleeding.  He's had multiple diverticular bleeds in the past.  He is without abdominal pain or change in bowel habits.    Past Medical History  Diagnosis Date  . HTN (hypertension)   . Diastolic dysfunction   . Atrial fibrillation   . GERD (gastroesophageal reflux disease)   . Allergic rhinitis   . Diverticulosis of colon     recurrent GI bleeding  . OSA (obstructive sleep apnea)     compliants w/ CPAP  . Hyperplastic colon polyp 2003  . Chronic gastritis   . Positive PPD     remote  . Atrial flutter   . Acute bronchitis   . Anemia     iron deficient  . Shortness of breath   . Venous insufficiency   . Moderate aortic insufficiency   . DJD (degenerative joint disease)   . COPD (chronic obstructive pulmonary disease)   . Hypothyroidism   . Adenomatous colon polyp    Past Surgical History  Procedure Laterality Date  . Cardiac catheterization  7.27.05    revealed a preserved EF with no CAD  . Tonsillectomy    . Total knee arthroplasty      bilateral  . Nasal sinus surgery    . Colonoscopy    . Joint replacement  2001    lt total knee-rt one done 00  . Hernia repair      bilateral  . Hernia repair    . Transurethral resection of prostate    . Polypectomy      adenomatous polyps 2010   family history includes Diabetes in his father; Hypertension in his mother; Lung cancer in his brother. Current Outpatient Prescriptions  Medication Sig Dispense Refill  . acetaminophen-codeine (TYLENOL #3) 300-30 MG per tablet Take 1 tablet by mouth every 4 (four) hours as needed for moderate pain.       Marland Kitchen  albuterol (PROAIR HFA) 108 (90 BASE) MCG/ACT inhaler Inhale 1 puff into the lungs every 6 (six) hours as needed for wheezing or shortness of breath.  1 Inhaler  3  . aspirin 81 MG tablet Take 81 mg by mouth. 3 days per week      . azelastine (ASTELIN) 137 MCG/SPRAY nasal spray Place 1 spray into the nose at bedtime. Use in each nostril as directed      . b complex vitamins tablet Take 1 tablet by mouth every morning.       . budesonide-formoterol (SYMBICORT) 160-4.5 MCG/ACT inhaler Inhale 2 puffs into the lungs 2 (two) times daily.  3 Inhaler  2  . chlorpheniramine (CHLOR-TRIMETON)  4 MG tablet Take 4 mg by mouth every evening.      . Cholecalciferol (VITAMIN D) 2000 UNITS CAPS Take 1 capsule by mouth daily.       Marland Kitchen diltiazem (DILACOR XR) 240 MG 24 hr capsule TAKE 1 CAPSULE TWICE A DAY (PLEASE CONTACT OFFICE TO SCHEDULE APPOINTMENT FOR FUTURE REFILLS PHONE NUMBER [phone])  180 capsule  0  . docusate sodium (COLACE) 100 MG capsule Take 100 mg by mouth 2 (two) times daily.        . fish oil-omega-3 fatty acids 1000 MG capsule Take 1 g by mouth 2 (two) times daily.        . furosemide (LASIX) 40 MG tablet Take 40 mg by mouth daily.       . Glucosamine HCl 1000 MG TABS Take 1 tablet by mouth daily.        Marland Kitchen guaifenesin (HUMIBID E) 400 MG TABS Take 400 mg by mouth 2 (two) times daily.        Marland Kitchen HYDROcodone-acetaminophen (NORCO/VICODIN) 5-325 MG per tablet Take 1 tablet by mouth every 6 (six) hours as needed for moderate pain (only takes for testing for relaxation).      Marland Kitchen levothyroxine (SYNTHROID, LEVOTHROID) 100 MCG tablet Take 100 mcg by mouth daily before breakfast.      . losartan (COZAAR) 100 MG tablet Take 1 tablet (100 mg total) by mouth daily.  90 tablet  4  . minoxidil (LONITEN) 2.5 MG tablet Take 2.5 mg by mouth every evening.       Marland Kitchen omeprazole (PRILOSEC) 40 MG capsule Take 40 mg by mouth 2 (two) times daily.      Marland Kitchen PROVIGIL 200 MG tablet Take 100 mg by mouth daily as needed. 1/2 tab by  mouth on Wed and Sun when pt drives      . Psyllium (METAMUCIL) 28.3 % POWD Take 1 scoop by mouth 2 (two) times daily. 1 scoop twice daily      . tiotropium (SPIRIVA) 18 MCG inhalation capsule Place 18 mcg into inhaler and inhale daily.       No current facility-administered medications for this visit.   Allergies as of 12/30/2013 - Review Complete 12/30/2013  Allergen Reaction Noted  . Ace inhibitors Cough 06/12/2013  . Beta adrenergic blockers Other (See Comments) 06/12/2013  . Fluticasone-salmeterol    . Hytrin [terazosin]  06/12/2013  . Prednisone    . Vioxx [rofecoxib] Other (See Comments) 06/12/2013    reports that he quit smoking about 35 years ago. His smoking use included Cigarettes. He has a 20 pack-year smoking history. He has never used smokeless tobacco. He reports that he drinks alcohol. He reports that he does not use illicit drugs.     Review of Systems: He has some unsteadiness of gait.  Pertinent positive and negative review of systems were noted in the above HPI section. All other review of systems were otherwise negative.  Vital signs were reviewed in today's medical record Physical Exam: General: Well developed , well nourished, no acute distress Skin: anicteric Head: Normocephalic and atraumatic Eyes:  sclerae anicteric, EOMI Ears: Normal auditory acuity Mouth: No deformity or lesions Neck: Supple, no masses or thyromegaly Lungs: Clear throughout to auscultation Heart: Regular rate and rhythm; no murmurs, rubs or bruits Abdomen: Soft, non tender and non distended. No masses, hepatosplenomegaly or hernias noted. Normal Bowel sounds Rectal:deferred Musculoskeletal: Symmetrical with no gross deformities  Skin: No lesions on visible extremities Pulses:  Normal pulses noted Extremities: No  clubbing, cyanosis, or deformities noted.  He has 4+ ankle edema Neurological: Alert oriented x 4, grossly nonfocal Cervical Nodes:  No significant cervical  adenopathy Inguinal Nodes: No significant inguinal adenopathy Psychological:  Alert and cooperative. Normal mood and affect  See Assessment and Plan under Problem List

## 2013-12-30 NOTE — Patient Instructions (Signed)
We will fax for your Cologuard test today

## 2013-12-30 NOTE — Assessment & Plan Note (Signed)
Adenomatous polyps in 2010.  Patient is asymptomatic.  Given his age and comorbidities I elected not to perform routine colorectal cancer screening by colonoscopy.  Plan to check the stool with Cologuard

## 2013-12-30 NOTE — Assessment & Plan Note (Signed)
History of recurrent diverticular bleeding but none in the last 3 years.  Plan to treat expectantly.

## 2014-01-24 ENCOUNTER — Ambulatory Visit (INDEPENDENT_AMBULATORY_CARE_PROVIDER_SITE_OTHER): Payer: Medicare Other

## 2014-01-24 DIAGNOSIS — M79609 Pain in unspecified limb: Secondary | ICD-10-CM | POA: Diagnosis not present

## 2014-01-24 DIAGNOSIS — B351 Tinea unguium: Secondary | ICD-10-CM | POA: Diagnosis not present

## 2014-01-24 DIAGNOSIS — M79606 Pain in leg, unspecified: Secondary | ICD-10-CM

## 2014-01-24 NOTE — Progress Notes (Signed)
   Subjective:    Patient ID: Richard Rosales, male    DOB: 02/06/1931, 80 y.o.   MRN: [id number]  HPI patient presents at this time for followup mycotic nail care debridement.   Review of Systems no new findings or systemic changes noted at this time continues to have venous insufficiency and peripheral edema wears compression stockings as instructed     Objective:   Physical Exam Lower extremity objective findings as follows pedal pulses are palpable DP +2/4 bilateral PT one over 4 bilateral capillary refill time 3 seconds all digits epicritic and proprioceptive sensations intact bilateral there is normal plantar response DTRs not elicited dermatologically skin color pigment normal hair growth absent nails thick brittle crumbly friable discolored with her toes incurvation particular hallux nails bilateral most severely deformed. Orthopedic biomechanical exam unremarkable mild digital contractures are noted orthopedic biomechanical exam otherwise unremarkable no open wounds ulcerations no secondary infection       Assessment & Plan:  Assessment this time is onychomycosis painful mycotic nails debrided 1 through 5 bilateral recommended topical Fungi-Nail to be applied to the affected nails as instructed for 12 months duration reappointed for palliative care in 3 months or as needed  Harriet Masson DPM

## 2014-01-24 NOTE — Patient Instructions (Signed)
Onychomycosis/Fungal Toenails  WHAT IS IT? An infection that lies within the keratin of your nail plate that is caused by a fungus.  WHY ME? Fungal infections affect all ages, sexes, races, and creeds.  There may be many factors that predispose you to a fungal infection such as age, coexisting medical conditions such as diabetes, or an autoimmune disease; stress, medications, fatigue, genetics, etc.  Bottom line: fungus thrives in a warm, moist environment and your shoes offer such a location.  IS IT CONTAGIOUS? Theoretically, yes.  You do not want to share shoes, nail clippers or files with someone who has fungal toenails.  Walking around barefoot in the same room or sleeping in the same bed is unlikely to transfer the organism.  It is important to realize, however, that fungus can spread easily from one nail to the next on the same foot.  HOW DO WE TREAT THIS?  There are several ways to treat this condition.  Treatment may depend on many factors such as age, medications, pregnancy, liver and kidney conditions, etc.  It is best to ask your doctor which options are available to you.  1. No treatment.   Unlike many other medical concerns, you can live with this condition.  However for many people this can be a painful condition and may lead to ingrown toenails or a bacterial infection.  It is recommended that you keep the nails cut short to help reduce the amount of fungal nail. 2. Topical treatment.  These range from herbal remedies to prescription strength nail lacquers.  About 40-50% effective, topicals require twice daily application for approximately 9 to 12 months or until an entirely new nail has grown out.  The most effective topicals are medical grade medications available through physicians offices. 3. Oral antifungal medications.  With an 80-90% cure rate, the most common oral medication requires 3 to 4 months of therapy and stays in your system for a year as the new nail grows out.  Oral  antifungal medications do require blood work to make sure it is a safe drug for you.  A liver function panel will be performed prior to starting the medication and after the first month of treatment.  It is important to have the blood work performed to avoid any harmful side effects.  In general, this medication safe but blood work is required. 4. Laser Therapy.  This treatment is performed by applying a specialized laser to the affected nail plate.  This therapy is noninvasive, fast, and non-painful.  It is not covered by insurance and is therefore, out of pocket.  The results have been very good with a 80-95% cure rate.  [facility] is the only practice in the area to offer this therapy. 5. Permanent Nail Avulsion.  Removing the entire nail so that a new nail will not grow back.   Obtain Fungi-Nail at any pharmacy without prescription. Apply the medication teach affected toenail once or twice daily as instructed for minimal 12 month

## 2014-01-26 ENCOUNTER — Ambulatory Visit (INDEPENDENT_AMBULATORY_CARE_PROVIDER_SITE_OTHER): Payer: Medicare Other | Admitting: Emergency Medicine

## 2014-01-26 ENCOUNTER — Encounter: Payer: Self-pay | Admitting: Emergency Medicine

## 2014-01-26 VITALS — BP 142/80 | HR 61 | Temp 97.4°F | Ht 69.0 in | Wt 259.4 lb

## 2014-01-26 DIAGNOSIS — J449 Chronic obstructive pulmonary disease, unspecified: Secondary | ICD-10-CM | POA: Diagnosis not present

## 2014-01-26 DIAGNOSIS — G4733 Obstructive sleep apnea (adult) (pediatric): Secondary | ICD-10-CM

## 2014-01-26 MED ORDER — DOXYCYCLINE HYCLATE 100 MG PO TABS: 100.0000 mg | ORAL_TABLET | Freq: Two times a day (BID) | ORAL | Status: DC

## 2014-01-26 MED ORDER — METHYLPREDNISOLONE 4 MG PO KIT: PACK | ORAL | Status: DC

## 2014-01-26 MED ORDER — METHYLPREDNISOLONE ACETATE 80 MG/ML IJ SUSP
80.0000 mg | Freq: Once | INTRAMUSCULAR | Status: AC
  Administered 2014-01-26: 80 mg via INTRAMUSCULAR

## 2014-01-26 MED ORDER — METHYLPREDNISOLONE ACETATE 80 MG/ML IJ SUSP: 120.0000 mg | Freq: Once | INTRAMUSCULAR | Status: DC

## 2014-01-26 NOTE — Assessment & Plan Note (Signed)
Continue CPAP.  

## 2014-01-26 NOTE — Assessment & Plan Note (Signed)
He has significant wheeze even at baseline, but I believe he is exacerbated. Will give medrol, doxy. Continue BD regimen

## 2014-01-26 NOTE — Patient Instructions (Signed)
Please take doxycycline for 10 days as directed Take Medrol pack to completion  Continue your inhaled medications as you have been taking them  Follow with Dr Lamonte Sakai in 6 weeks or sooner if you have any problems Continue your CPAP

## 2014-01-26 NOTE — Progress Notes (Signed)
Subjective:  Patient ID: Richard Rosales, male    DOB: [birth year], 80 y.o.   MRN: [id number]  80 yo man, former smoker(quit'80)  For FU of COPD &  OSA on CPAP 12 cm. He has  hx of HTN, A Fib, chronic diastolic dysfxn and LE edema, GERD, diverticulosis, remote positive PPD.   TTE with preserved LVEF, PASP 34, not able to comment on diastolic fxn. PFTs 5/11  Showed moderate airway obstruction with FEv1 55% & good BD respnse - improved to 67%  09/15/2011 Follow up  Patient returns for a two-week followup visit. Last visit had a COPD exacerbation treated with antibiotics and steroids. Patient has had 2 COPD flares over last couple months. He feels that he is back to his baseline. He denies any chest pain, orthopnea, PND, increased leg swelling, hemoptysis, or fever. Currently taking Symbicort 2 puffs twice daily.  ROV 11/13/11 -- Hx OSA, COPD on Symbicort. Also A flutter, chronic diastolic CHF. He saw TP in March and Spiriva was added to Symbicort, he believes it has helped. He has had coughing for the last 3 weeks, one day had nausea. He has had a lot of mucous and drainage. Still using fluticasone nasal spray, loratadine. Not using [place] right now.   ROV 02/16/12 -- Hx OSA, COPD on Symbicort. Also A flutter, chronic diastolic CHF.  He is off the loratadine and fluticasone, he is using chlorpheniramine q6h. He continues to have paroxysms. He has been on allergy shots in the past, not recently. He is interested in going to Dr Orvil Feil. He tells me that breathing is overall the same. He is still limited in his exertion. Still on Symbicort and spiriva, no pred or abx since last time.  Reliable with CPAP.   ROV 03/22/12 -- Hx OSA, COPD on Symbicort and Spiriva. Also A flutter, chronic diastolic CHF.  He is having more mucous and cough. Reassuring eval by Dr Orvil Feil, better drainage with astelin/fluticasone. He is wondering if Symbicort is bothering his cough.   ROV 08/26/12 -- Hx OSA, COPD on Symbicort and Spiriva.  Also A flutter, chronic diastolic CHF. Chronic rhinitis, has been seen by Dr Orvil Feil. Having cough, nasal congestion and drainage. He is doing [place] bid, no longer on nasal steroid or astelin. He uses chlortrimeton, not currently on loratadine or any anti-histamine. Functional capacity limited by his arthritis. Never uses SABA.   ROV 12/02/12 -- OSA, COPD on Symbicort and Spiriva. Also A flutter, chronic diastolic CHF. Chronic rhinitis, doing NSW's. He restarted fluticasone and allegra. Not currently doing chlortrimeton. No AE's since last time. He has a SABA, has never needed to use. Good compliance w CPAP. No fevers.   Acute OV  Complains of increased SOB, prod cough with clear mucus, some wheezing, throat congestion, head congestion w/ PND  - worse x3 weeks.  denies f/c/s, increased edema. No otc used.  No fever, orthopnea, chest pain, n/v.  Wears CPAP each night.   ROV 03/30/13 -- OSA, COPD on Symbicort and Spiriva. Also A flutter, chronic diastolic CHF. He saw Dr Gwenette Greet 02/25/13 for persistent sx after a COPD flare.  It is very difficult for him to take corticosteroids, gets side effects of tremor, aches, mood swings, etc. He changed allegra to chlorpheniramine, seems to be working better. He is sleeping better, has some wheeze that seems to be UA. His GERD is treated.   ROV 07/08/13 -- OSA, COPD on Symbicort and Spiriva. Also A flutter, chronic diastolic CHF.  Was seen by  [facility] in 12/14 for an AE-COPD. He is having fatigue, tiredness with heavier exertion. He is still having some cough, nasal drainage. He cut his omeprazole to once a day about 1.5 months ago. Just increased back to bid today.   ROV 09/06/13 -- Hx of OSA, COPD, A flutter, chronic diastolic CHF. He is on CPAP, uses Spiriva and Symbicort. He is SOB with exertion. He has not needed his SABA.  No flares. Able to wear CPAP every night. Has an am cough, usually clear.   ROV 11/24/13 -- Hx of OSA, COPD, A flutter, chronic diastolic CHF. He  unfortunately had a fall recently, had to be admitted. A Ct chest was done and found several other findings that need to be evaluated - cardiomegaly, ? Hepatic enlargement / cirrhosis, thyroid nodules. He is having a hacking cough, wakes him up at night, can also happen during the day. He is doing well with his CPAP. He is on spiriva and symbicort. Takes omeprazole bid.   ROV 01/26/14 -- OSA, COPD, A flutter, chronic diastolic CHF. Has a chronic cough and rhinitis.  He returns feeling badly, lots of rhinitis and congestion. He remains on Spiriva and Symbicort. He is coughing up yellow mucous, bad taste.    Filed Vitals:   01/26/14 1527  BP: 142/80  Pulse: 61  Temp: 97.4 F (36.3 C)  TempSrc: Oral  Height: 5\' 9"  (1.753 m)  Weight: 259 lb 6.4 oz (117.663 kg)  SpO2: 91%    Exam:  GEN: A/Ox3; pleasant , NAD,  obese kyphotic.   HEENT:  [place]/AT,  EACs-clear, TMs-wnl, NOSE-clear drainage , THROAT-clear, no lesions, no postnasal drip or exudate noted.   NECK:  Supple w/ fair ROM; no JVD; normal carotid impulses w/o bruits; no thyromegaly or nodules palpated; no lymphadenopathy.  RESP B loud expiratory wheezes. Coarse sounds with cough  CARD:  RRR, no m/r/g  , 1+ peripheral edema, pulses intact, no cyanosis or clubbing.  Musco: Warm bil, no deformities or joint swelling noted.   Neuro: alert, no focal deficits noted.    Skin: Warm, no lesions or rashes    11/18/13 --  COMPARISON: No prior CT chest. Multiple prior chest x-rays, most  recently 11/17/2013.  FINDINGS:  Contrast opacification of the pulmonary arteries is excellent. No  filling defects within either main pulmonary artery or their  branches in either lung to suggest pulmonary embolism. Heart  markedly enlarged with left ventricular predominance and evidence of  left ventricular hypertrophy. Small pericardial effusion. Moderate 3  vessel coronary atherosclerosis. Severe atherosclerosis involving  the thoracic and upper  abdominal aorta and their visualized branches  without evidence of aneurysm. Tortuous thoracic and upper abdominal  aorta.  Emphysematous changes throughout both lungs. Subsegmental airspace  consolidation with air bronchograms in the deep posterior right  lower lobe. Lungs otherwise clear. No pulmonary parenchymal nodules  or masses. No pleural effusions.  Thyroid gland enlargement with multiple nodules, the largest nodules  adjacent to one another in the right lobe measuring approximately  2.5 x 2.2 x 3.1 cm in the mid pole and 3.0 x 3.0 x 2.1 cm in the  lower pole. Scattered normal size lymph nodes in the mediastinum,  hila, and axilla without significant lymphadenopathy. Mild bilateral  gynecomastia.  Irregularity involving the contour of the visualized liver with  relative enlargement of the left lobe. Cysts involving the mid  portions of the visualized kidneys. Moderate pancreatic atrophy.  Hiatal hernia containing predominately intra-abdominal fat.  Diverticulosis involving  the visualized distal transverse and  proximal descending colon without evidence of acute diverticulitis.  Bone window images demonstrated diffuse thoracic spondylosis and  exaggeration of the usual thoracic kyphosis.  Review of the MIP images confirms the above findings.   IMPRESSION:  1. No evidence of pulmonary embolism.  2. COPD/emphysema. Atelectasis involving the right lower lobe. No  acute cardiopulmonary disease otherwise.  3. Multinodular goiter, with adjacent 3 cm nodules in the right  lobe.  4. Hepatic cirrhosis.  5. Moderate pancreatic atrophy.  6. Diverticulosis involving the visualized distal transverse and  proximal descending colon.  7. Cardiomegaly with 3 vessel coronary artery disease. Left  ventricular hypertrophy. Small pericardial effusion.  8. Mild bilateral gynecomastia   Assessment & Plan:   COPD He has significant wheeze even at baseline, but I believe he is exacerbated.  Will give medrol, doxy. Continue BD regimen  SLEEP APNEA, OBSTRUCTIVE Continue CPAP

## 2014-01-26 NOTE — Addendum Note (Signed)
Addended by: Elie Confer on: 01/26/2014 04:09 PM   Modules accepted: Orders

## 2014-02-07 ENCOUNTER — Telehealth: Payer: Self-pay | Admitting: Gastroenterology

## 2014-02-07 NOTE — Telephone Encounter (Signed)
Spoke with the patient. He did try to collect specimen for the test, but the container/set up was like "sitting on a stove pipe" and was to uncomfortable to relax. He tried "several different things" like fashioning a cushion, but was unable to make it any better for himself. He asks if there are any other options for him.

## 2014-02-07 NOTE — Telephone Encounter (Signed)
I would call the company (cologuard) and see if they have any suggestions

## 2014-02-08 ENCOUNTER — Telehealth: Payer: Self-pay | Admitting: Gastroenterology

## 2014-02-08 NOTE — Telephone Encounter (Signed)
Patient agrees to call the company. He will let us know what they say and if it works.

## 2014-02-08 NOTE — Telephone Encounter (Signed)
Patient says the company has been told by other patients that this is an issue. He was also told the Cologuard is being redesigned to remedy this and should be on the market after the first of 2016. He is satisfies with waiting if that is acceptable to you.

## 2014-02-10 NOTE — Telephone Encounter (Signed)
Ok

## 2014-03-06 ENCOUNTER — Other Ambulatory Visit (HOSPITAL_COMMUNITY): Payer: Self-pay | Admitting: Internal Medicine

## 2014-03-06 ENCOUNTER — Encounter: Payer: Self-pay | Admitting: Internal Medicine

## 2014-03-06 ENCOUNTER — Ambulatory Visit (HOSPITAL_COMMUNITY): Payer: Medicare Other | Attending: Internal Medicine | Admitting: Radiology

## 2014-03-06 ENCOUNTER — Ambulatory Visit (INDEPENDENT_AMBULATORY_CARE_PROVIDER_SITE_OTHER): Payer: Medicare Other | Admitting: Internal Medicine

## 2014-03-06 VITALS — BP 118/72 | HR 74 | Ht 70.0 in | Wt 251.0 lb

## 2014-03-06 DIAGNOSIS — E669 Obesity, unspecified: Secondary | ICD-10-CM | POA: Insufficient documentation

## 2014-03-06 DIAGNOSIS — G4733 Obstructive sleep apnea (adult) (pediatric): Secondary | ICD-10-CM

## 2014-03-06 DIAGNOSIS — I059 Rheumatic mitral valve disease, unspecified: Secondary | ICD-10-CM | POA: Diagnosis not present

## 2014-03-06 DIAGNOSIS — I4891 Unspecified atrial fibrillation: Secondary | ICD-10-CM | POA: Insufficient documentation

## 2014-03-06 DIAGNOSIS — I482 Chronic atrial fibrillation, unspecified: Secondary | ICD-10-CM

## 2014-03-06 DIAGNOSIS — J4489 Other specified chronic obstructive pulmonary disease: Secondary | ICD-10-CM | POA: Diagnosis not present

## 2014-03-06 DIAGNOSIS — I079 Rheumatic tricuspid valve disease, unspecified: Secondary | ICD-10-CM | POA: Insufficient documentation

## 2014-03-06 DIAGNOSIS — I10 Essential (primary) hypertension: Secondary | ICD-10-CM | POA: Insufficient documentation

## 2014-03-06 DIAGNOSIS — I119 Hypertensive heart disease without heart failure: Secondary | ICD-10-CM | POA: Insufficient documentation

## 2014-03-06 DIAGNOSIS — I517 Cardiomegaly: Secondary | ICD-10-CM | POA: Insufficient documentation

## 2014-03-06 DIAGNOSIS — Z87891 Personal history of nicotine dependence: Secondary | ICD-10-CM | POA: Diagnosis not present

## 2014-03-06 DIAGNOSIS — I359 Nonrheumatic aortic valve disorder, unspecified: Secondary | ICD-10-CM | POA: Insufficient documentation

## 2014-03-06 DIAGNOSIS — E785 Hyperlipidemia, unspecified: Secondary | ICD-10-CM | POA: Insufficient documentation

## 2014-03-06 DIAGNOSIS — J449 Chronic obstructive pulmonary disease, unspecified: Secondary | ICD-10-CM | POA: Diagnosis not present

## 2014-03-06 DIAGNOSIS — M1990 Unspecified osteoarthritis, unspecified site: Secondary | ICD-10-CM | POA: Diagnosis not present

## 2014-03-06 NOTE — Progress Notes (Signed)
PCP: Alesia Richards, MD  The patient presents today for routine cardiology followup.  Since last being seen in our clinic, the patient reports doing reasonably well. His SOB is stable.  He has lost a substantial amount of weight.  Today he says this is intentional.  Today, he denies symptoms of palpitations, chest pain,  dizziness, presyncope, syncope, or neurologic sequela.  He has stable edema with support hose.  The patient feels that he is tolerating medications without difficulties and is otherwise without complaint today.    Past Medical History  Diagnosis Date  . HTN (hypertension)   . Diastolic dysfunction   . Atrial fibrillation   . GERD (gastroesophageal reflux disease)   . Allergic rhinitis   . Diverticulosis of colon     recurrent GI bleeding  . OSA (obstructive sleep apnea)     compliants w/ CPAP  . Hyperplastic colon polyp 2003  . Chronic gastritis   . Positive PPD     remote  . Atrial flutter   . Acute bronchitis   . Anemia     iron deficient  . Shortness of breath   . Venous insufficiency   . Moderate aortic insufficiency   . DJD (degenerative joint disease)   . COPD (chronic obstructive pulmonary disease)   . Hypothyroidism   . Adenomatous colon polyp    Past Surgical History  Procedure Laterality Date  . Cardiac catheterization  7.27.05    revealed a preserved EF with no CAD  . Tonsillectomy    . Total knee arthroplasty      bilateral  . Nasal sinus surgery    . Colonoscopy    . Joint replacement  2001    lt total knee-rt one done 00  . Hernia repair      bilateral  . Hernia repair    . Transurethral resection of prostate    . Polypectomy      adenomatous polyps 2010    Current Outpatient Prescriptions  Medication Sig Dispense Refill  . acetaminophen-codeine (TYLENOL #3) 300-30 MG per tablet Take 1 tablet by mouth every 4 (four) hours as needed for moderate pain.       Marland Kitchen albuterol (PROAIR HFA) 108 (90 BASE) MCG/ACT inhaler Inhale 1 puff  into the lungs every 6 (six) hours as needed for wheezing or shortness of breath.  1 Inhaler  3  . aspirin 81 MG tablet Take 1 tablet 3 days per week      . azelastine (ASTELIN) 137 MCG/SPRAY nasal spray Place 1 spray into the nose at bedtime.       Marland Kitchen b complex vitamins tablet Take 1 tablet by mouth every morning.       . budesonide-formoterol (SYMBICORT) 160-4.5 MCG/ACT inhaler Inhale 2 puffs into the lungs 2 (two) times daily.  3 Inhaler  2  . chlorpheniramine (CHLOR-TRIMETON) 4 MG tablet Take 1 tablet in the morning and 2 tablets at bedtime      . Cholecalciferol (VITAMIN D) 2000 UNITS CAPS Take 1 capsule by mouth daily.       Marland Kitchen diltiazem (DILACOR XR) 240 MG 24 hr capsule TAKE 1 CAPSULE TWICE A DAY      . docusate sodium (COLACE) 100 MG capsule Take 100 mg by mouth 2 (two) times daily.        Marland Kitchen doxycycline (VIBRA-TABS) 100 MG tablet Take 1 tablet (100 mg total) by mouth 2 (two) times daily.  20 tablet  0  . fish oil-omega-3 fatty acids 1000 MG  capsule Take 1 g by mouth 2 (two) times daily.        . furosemide (LASIX) 40 MG tablet Take 40 mg by mouth daily.       . Glucosamine HCl 1000 MG TABS Take 1 tablet by mouth daily.        Marland Kitchen guaifenesin (HUMIBID E) 400 MG TABS Take 400 mg by mouth 2 (two) times daily.        Marland Kitchen HYDROcodone-acetaminophen (NORCO/VICODIN) 5-325 MG per tablet Take 1 tablet by mouth every 6 (six) hours as needed for moderate pain (only takes for testing for relaxation).      Marland Kitchen levothyroxine (SYNTHROID, LEVOTHROID) 100 MCG tablet Take 100 mcg by mouth daily before breakfast.      . losartan (COZAAR) 100 MG tablet Take 1 tablet (100 mg total) by mouth daily.  90 tablet  4  . Minoxidil (LONITEN PO) Take 2.5 mg by mouth at bedtime.      Marland Kitchen omeprazole (PRILOSEC) 40 MG capsule Take 40 mg by mouth 2 (two) times daily.      Marland Kitchen PROVIGIL 200 MG tablet Take 1/2 tablet by mouth on Wed and Sun when needed (when pt drives)      . tiotropium (SPIRIVA) 18 MCG inhalation capsule Place 18 mcg  into inhaler and inhale daily.      . methylPREDNISolone (MEDROL DOSEPAK) 4 MG tablet ONLY TAKES WHEN HE HAS A FLARE-UP. Please follow package directions       No current facility-administered medications for this visit.    Allergies  Allergen Reactions  . Ace Inhibitors Cough  . Beta Adrenergic Blockers Other (See Comments)    bradycardia  . Fluticasone-Salmeterol     Unable to remember per pt  . Hytrin [Terazosin]     Nasal congestion   . Prednisone     Shakes / tremors  . Vioxx [Rofecoxib] Other (See Comments)    dyspepsia    History   Social History  . Marital Status: Married    Spouse Name: N/A    Number of Children: 4  . Years of Education: N/A   Occupational History  . part-time as a Theme park manager at [employer]  . Smoking status: Former Smoker -- 1.00 packs/day for 20 years    Types: Cigarettes    Quit date: 08/01/1978  . Smokeless tobacco: Never Used  . Alcohol Use: Yes     Comment: about 3 to 4 times a week  . Drug Use: No  . Sexual Activity: Not on file   Other Topics Concern  . Not on file   Social History Narrative   Patient lives in [place]   Retired [employer] but works part-time as a Theme park manager at [employer] in [place].    Family History  Problem Relation Age of Onset  . Hypertension Mother   . Diabetes Father   . Lung cancer Brother     chemical     Physical Exam: Filed Vitals:   03/06/14 1038  BP: 118/72  Pulse: 74  Height: 5\' 10"  (1.778 m)  Weight: 251 lb (113.853 kg)    GEN- The patient is overweight appearing, alert and oriented x 3 today.   Head- normocephalic, atraumatic Eyes-  Sclera clear, conjunctiva pink Ears- hearing intact Oropharynx- clear Neck- supple, JVP 9cm Lymph- no cervical lymphadenopathy Lungs- coarse breath sounds with a prolonged expiratory phase Heart- irregular rate and rhythm, no murmurs, rubs or gallops, PMI  not laterally displaced GI-  soft, NT, ND, + BS Extremities- no clubbing, cyanosis, 1+ edema, support hose are in place MS- walks slowly with a cane Neuro- strength and sensation are intact  ekg today reveals afib , V rate 74 bpm, IVCD todays echo is pending  Assessment and Plan:  1. SOB- respiratory in etiology,  Wheezing stable today He will followup with Dr Lamonte Sakai  2. Hypertensive cardiovascular disease Improved No changes  3. Permanent afib Declines anticoagulation again today.  Given recent fall, may be a poor candidate for anticoagulation anyway  4. Moderate AI Repeat echo today is pending  5. OSA Reports compliance with CPAP   Follow-up with Cecille Rubin in 6 months I will see in a year

## 2014-03-06 NOTE — Progress Notes (Signed)
Echocardiogram performed.  

## 2014-03-06 NOTE — Patient Instructions (Signed)
Your physician wants you to follow-up in [place], [place]. You will receive a reminder letter in the mail two months in advance. If you don't receive a letter, please call our office to schedule the follow-up appointment.  Your physician wants you to follow-up in [place] DR. ALLRED. You will receive a reminder letter in the mail two months in advance. If you don't receive a letter, please call our office to schedule the follow-up appointment.

## 2014-03-14 ENCOUNTER — Encounter: Payer: Self-pay | Admitting: Emergency Medicine

## 2014-03-14 ENCOUNTER — Ambulatory Visit (INDEPENDENT_AMBULATORY_CARE_PROVIDER_SITE_OTHER): Payer: Medicare Other | Admitting: Emergency Medicine

## 2014-03-14 ENCOUNTER — Telehealth: Payer: Self-pay | Admitting: Emergency Medicine

## 2014-03-14 VITALS — BP 130/60 | HR 66 | Temp 97.1°F | Ht 69.0 in | Wt 253.5 lb

## 2014-03-14 DIAGNOSIS — J309 Allergic rhinitis, unspecified: Secondary | ICD-10-CM

## 2014-03-14 DIAGNOSIS — J449 Chronic obstructive pulmonary disease, unspecified: Secondary | ICD-10-CM | POA: Diagnosis not present

## 2014-03-14 NOTE — Assessment & Plan Note (Signed)
Please continue your Spiriva and Symbicort  Keep your albuterol available to use as needed Get your Flu Shot with Dr Melford Aase as planned.  Follow with Dr Lamonte Sakai in 3 months or sooner if you have any problems

## 2014-03-14 NOTE — Assessment & Plan Note (Signed)
Restart your chlorpheniramine, one pill daily and 2 pills at night.

## 2014-03-14 NOTE — Telephone Encounter (Signed)
Not needed

## 2014-03-14 NOTE — Patient Instructions (Signed)
Please continue your Spiriva and Symbicort  Keep your albuterol available to use as needed Get your Flu Shot with Dr Melford Aase as planned.  Restart your chlorpheniramine, one pill daily and 2 pills at night.  Follow with Dr Lamonte Sakai in 3 months or sooner if you have any problems.

## 2014-03-14 NOTE — Progress Notes (Signed)
Subjective:  Patient ID: Richard Rosales, male    DOB: 1930/09/12, 80 y.o.   MRN: [id number]  80 yo man, former smoker(quit'80)  For FU of COPD &  OSA on CPAP 12 cm. He has  hx of HTN, A Fib, chronic diastolic dysfxn and LE edema, GERD, diverticulosis, remote positive PPD.   TTE with preserved LVEF, PASP 34, not able to comment on diastolic fxn. PFTs 5/11  Showed moderate airway obstruction with FEv1 55% & good BD respnse - improved to 67%  09/15/2011 Follow up  Patient returns for a two-week followup visit. Last visit had a COPD exacerbation treated with antibiotics and steroids. Patient has had 2 COPD flares over last couple months. He feels that he is back to his baseline. He denies any chest pain, orthopnea, PND, increased leg swelling, hemoptysis, or fever. Currently taking Symbicort 2 puffs twice daily.  ROV 11/13/11 -- Hx OSA, COPD on Symbicort. Also A flutter, chronic diastolic CHF. He saw TP in March and Spiriva was added to Symbicort, he believes it has helped. He has had coughing for the last 3 weeks, one day had nausea. He has had a lot of mucous and drainage. Still using fluticasone nasal spray, loratadine. Not using [place] right now.   ROV 02/16/12 -- Hx OSA, COPD on Symbicort. Also A flutter, chronic diastolic CHF.  He is off the loratadine and fluticasone, he is using chlorpheniramine q6h. He continues to have paroxysms. He has been on allergy shots in the past, not recently. He is interested in going to Dr Orvil Feil. He tells me that breathing is overall the same. He is still limited in his exertion. Still on Symbicort and spiriva, no pred or abx since last time.  Reliable with CPAP.   ROV 03/22/12 -- Hx OSA, COPD on Symbicort and Spiriva. Also A flutter, chronic diastolic CHF.  He is having more mucous and cough. Reassuring eval by Dr Orvil Feil, better drainage with astelin/fluticasone. He is wondering if Symbicort is bothering his cough.   ROV 08/26/12 -- Hx OSA, COPD on Symbicort and Spiriva.  Also A flutter, chronic diastolic CHF. Chronic rhinitis, has been seen by Dr Orvil Feil. Having cough, nasal congestion and drainage. He is doing [place] bid, no longer on nasal steroid or astelin. He uses chlortrimeton, not currently on loratadine or any anti-histamine. Functional capacity limited by his arthritis. Never uses SABA.   ROV 12/02/12 -- OSA, COPD on Symbicort and Spiriva. Also A flutter, chronic diastolic CHF. Chronic rhinitis, doing [place]'s. He restarted fluticasone and allegra. Not currently doing chlortrimeton. No AE's since last time. He has a SABA, has never needed to use. Good compliance w CPAP. No fevers.   Acute OV  Complains of increased SOB, prod cough with clear mucus, some wheezing, throat congestion, head congestion w/ PND  - worse x3 weeks.  denies f/c/s, increased edema. No otc used.  No fever, orthopnea, chest pain, n/v.  Wears CPAP each night.   ROV 03/30/13 -- OSA, COPD on Symbicort and Spiriva. Also A flutter, chronic diastolic CHF. He saw Dr Gwenette Greet 02/25/13 for persistent sx after a COPD flare.  It is very difficult for him to take corticosteroids, gets side effects of tremor, aches, mood swings, etc. He changed allegra to chlorpheniramine, seems to be working better. He is sleeping better, has some wheeze that seems to be UA. His GERD is treated.   ROV 07/08/13 -- OSA, COPD on Symbicort and Spiriva. Also A flutter, chronic diastolic CHF.  Was seen by  [facility] in 12/14 for an AE-COPD. He is having fatigue, tiredness with heavier exertion. He is still having some cough, nasal drainage. He cut his omeprazole to once a day about 1.5 months ago. Just increased back to bid today.   ROV 09/06/13 -- Hx of OSA, COPD, A flutter, chronic diastolic CHF. He is on CPAP, uses Spiriva and Symbicort. He is SOB with exertion. He has not needed his SABA.  No flares. Able to wear CPAP every night. Has an am cough, usually clear.   ROV 11/24/13 -- Hx of OSA, COPD, A flutter, chronic diastolic CHF. He  unfortunately had a fall recently, had to be admitted. A Ct chest was done and found several other findings that need to be evaluated - cardiomegaly, ? Hepatic enlargement / cirrhosis, thyroid nodules. He is having a hacking cough, wakes him up at night, can also happen during the day. He is doing well with his CPAP. He is on spiriva and symbicort. Takes omeprazole bid.   ROV 01/26/14 -- OSA, COPD, A flutter, chronic diastolic CHF. Has a chronic cough and rhinitis.  He returns feeling badly, lots of rhinitis and congestion. He remains on Spiriva and Symbicort. He is coughing up yellow mucous, bad taste.   ROV 03/14/14 -- hx OSA compliant with CPAP, COPD, A flutter, chronic diastolic CHF. Has a chronic cough and rhinitis.  He was treated beginning of August for an AE. He has been having more trouble with congestion over the last 2 weeks since he ran out of chlortrimeton. He is planning to get more.    Filed Vitals:   03/14/14 1046  BP: 130/60  Pulse: 66  Temp: 97.1 F (36.2 C)  TempSrc: Oral  Height: 5\' 9"  (1.753 m)  Weight: 253 lb 8 oz (114.987 kg)  SpO2: 93%    Exam:  GEN: A/Ox3; pleasant , NAD,  obese kyphotic.   HEENT:  [place]/AT,  EACs-clear, TMs-wnl, NOSE-clear drainage , THROAT-clear, no lesions, no postnasal drip or exudate noted.   NECK:  Supple w/ fair ROM; no JVD; normal carotid impulses w/o bruits; no thyromegaly or nodules palpated; no lymphadenopathy.  RESP B loud expiratory wheezes. Coarse sounds with cough  CARD:  RRR, no m/r/g  , 1+ peripheral edema, pulses intact, no cyanosis or clubbing.  Musco: Warm bil, no deformities or joint swelling noted.   Neuro: alert, no focal deficits noted.    Skin: Warm, no lesions or rashes    11/18/13 --  COMPARISON: No prior CT chest. Multiple prior chest x-rays, most  recently 11/17/2013.  FINDINGS:  Contrast opacification of the pulmonary arteries is excellent. No  filling defects within either main pulmonary artery or their   branches in either lung to suggest pulmonary embolism. Heart  markedly enlarged with left ventricular predominance and evidence of  left ventricular hypertrophy. Small pericardial effusion. Moderate 3  vessel coronary atherosclerosis. Severe atherosclerosis involving  the thoracic and upper abdominal aorta and their visualized branches  without evidence of aneurysm. Tortuous thoracic and upper abdominal  aorta.  Emphysematous changes throughout both lungs. Subsegmental airspace  consolidation with air bronchograms in the deep posterior right  lower lobe. Lungs otherwise clear. No pulmonary parenchymal nodules  or masses. No pleural effusions.  Thyroid gland enlargement with multiple nodules, the largest nodules  adjacent to one another in the right lobe measuring approximately  2.5 x 2.2 x 3.1 cm in the mid pole and 3.0 x 3.0 x 2.1 cm in the  lower pole. Scattered normal  size lymph nodes in the mediastinum,  hila, and axilla without significant lymphadenopathy. Mild bilateral  gynecomastia.  Irregularity involving the contour of the visualized liver with  relative enlargement of the left lobe. Cysts involving the mid  portions of the visualized kidneys. Moderate pancreatic atrophy.  Hiatal hernia containing predominately intra-abdominal fat.  Diverticulosis involving the visualized distal transverse and  proximal descending colon without evidence of acute diverticulitis.  Bone window images demonstrated diffuse thoracic spondylosis and  exaggeration of the usual thoracic kyphosis.  Review of the MIP images confirms the above findings.   IMPRESSION:  1. No evidence of pulmonary embolism.  2. COPD/emphysema. Atelectasis involving the right lower lobe. No  acute cardiopulmonary disease otherwise.  3. Multinodular goiter, with adjacent 3 cm nodules in the right  lobe.  4. Hepatic cirrhosis.  5. Moderate pancreatic atrophy.  6. Diverticulosis involving the visualized distal transverse  and  proximal descending colon.  7. Cardiomegaly with 3 vessel coronary artery disease. Left  ventricular hypertrophy. Small pericardial effusion.  8. Mild bilateral gynecomastia   Assessment & Plan:   COPD Please continue your Spiriva and Symbicort  Keep your albuterol available to use as needed Get your Flu Shot with Dr Melford Aase as planned.  Follow with Dr Lamonte Sakai in 3 months or sooner if you have any problems  Allergic rhinitis Restart your chlorpheniramine, one pill daily and 2 pills at night.

## 2014-03-24 ENCOUNTER — Other Ambulatory Visit: Payer: Self-pay | Admitting: Internal Medicine

## 2014-03-28 ENCOUNTER — Ambulatory Visit: Payer: Self-pay | Admitting: Physician Assistant

## 2014-03-29 DIAGNOSIS — J449 Chronic obstructive pulmonary disease, unspecified: Secondary | ICD-10-CM | POA: Diagnosis not present

## 2014-03-29 DIAGNOSIS — J3089 Other allergic rhinitis: Secondary | ICD-10-CM | POA: Diagnosis not present

## 2014-04-13 ENCOUNTER — Ambulatory Visit (INDEPENDENT_AMBULATORY_CARE_PROVIDER_SITE_OTHER): Payer: Medicare Other | Admitting: Physician Assistant

## 2014-04-13 ENCOUNTER — Encounter: Payer: Self-pay | Admitting: Physician Assistant

## 2014-04-13 VITALS — BP 148/80 | HR 76 | Temp 98.0°F | Resp 16 | Ht 69.0 in | Wt 249.0 lb

## 2014-04-13 DIAGNOSIS — I10 Essential (primary) hypertension: Secondary | ICD-10-CM | POA: Diagnosis not present

## 2014-04-13 DIAGNOSIS — E782 Mixed hyperlipidemia: Secondary | ICD-10-CM | POA: Diagnosis not present

## 2014-04-13 DIAGNOSIS — Z23 Encounter for immunization: Secondary | ICD-10-CM

## 2014-04-13 DIAGNOSIS — R7303 Prediabetes: Secondary | ICD-10-CM

## 2014-04-13 DIAGNOSIS — Z79899 Other long term (current) drug therapy: Secondary | ICD-10-CM

## 2014-04-13 DIAGNOSIS — I119 Hypertensive heart disease without heart failure: Secondary | ICD-10-CM

## 2014-04-13 DIAGNOSIS — I5032 Chronic diastolic (congestive) heart failure: Secondary | ICD-10-CM | POA: Diagnosis not present

## 2014-04-13 DIAGNOSIS — I482 Chronic atrial fibrillation, unspecified: Secondary | ICD-10-CM

## 2014-04-13 DIAGNOSIS — E039 Hypothyroidism, unspecified: Secondary | ICD-10-CM

## 2014-04-13 DIAGNOSIS — G4733 Obstructive sleep apnea (adult) (pediatric): Secondary | ICD-10-CM

## 2014-04-13 DIAGNOSIS — R7309 Other abnormal glucose: Secondary | ICD-10-CM | POA: Diagnosis not present

## 2014-04-13 DIAGNOSIS — E559 Vitamin D deficiency, unspecified: Secondary | ICD-10-CM

## 2014-04-13 LAB — HEPATIC FUNCTION PANEL
ALT: 17 U/L (ref 0–53)
AST: 22 U/L (ref 0–37)
Albumin: 3.8 g/dL (ref 3.5–5.2)
Alkaline Phosphatase: 92 U/L (ref 39–117)
Bilirubin, Direct: 0.2 mg/dL (ref 0.0–0.3)
Indirect Bilirubin: 0.4 mg/dL (ref 0.2–1.2)
TOTAL PROTEIN: 6 g/dL (ref 6.0–8.3)
Total Bilirubin: 0.6 mg/dL (ref 0.2–1.2)

## 2014-04-13 LAB — LIPID PANEL
CHOLESTEROL: 143 mg/dL (ref 0–200)
HDL: 55 mg/dL (ref >39)
LDL Cholesterol: 72 mg/dL (ref 0–99)
TRIGLYCERIDES: 82 mg/dL (ref <150)
Total CHOL/HDL Ratio: 2.6 Ratio
VLDL: 16 mg/dL (ref 0–40)

## 2014-04-13 LAB — BASIC METABOLIC PANEL WITH GFR
BUN: 12 mg/dL (ref 6–23)
CO2: 28 mEq/L (ref 19–32)
Calcium: 9.1 mg/dL (ref 8.4–10.5)
Chloride: 102 mEq/L (ref 96–112)
Creat: 0.7 mg/dL (ref 0.50–1.35)
GFR, EST NON AFRICAN AMERICAN: 89 mL/min
GLUCOSE: 88 mg/dL (ref 70–99)
Potassium: 4.4 mEq/L (ref 3.5–5.3)
Sodium: 137 mEq/L (ref 135–145)

## 2014-04-13 LAB — CBC WITH DIFFERENTIAL/PLATELET
BASOS ABS: 0.1 10*3/uL (ref 0.0–0.1)
Basophils Relative: 1 % (ref 0–1)
EOS ABS: 0.4 10*3/uL (ref 0.0–0.7)
Eosinophils Relative: 5 % (ref 0–5)
HCT: 42.5 % (ref 39.0–52.0)
HEMOGLOBIN: 14.2 g/dL (ref 13.0–17.0)
Lymphocytes Relative: 19 % (ref 12–46)
Lymphs Abs: 1.5 10*3/uL (ref 0.7–4.0)
MCH: 30.7 pg (ref 26.0–34.0)
MCHC: 33.4 g/dL (ref 30.0–36.0)
MCV: 91.8 fL (ref 78.0–100.0)
MONO ABS: 0.6 10*3/uL (ref 0.1–1.0)
MONOS PCT: 8 % (ref 3–12)
Neutro Abs: 5.4 10*3/uL (ref 1.7–7.7)
Neutrophils Relative %: 67 % (ref 43–77)
Platelets: 226 10*3/uL (ref 150–400)
RBC: 4.63 MIL/uL (ref 4.22–5.81)
RDW: 13.5 % (ref 11.5–15.5)
WBC: 8.1 10*3/uL (ref 4.0–10.5)

## 2014-04-13 LAB — HEMOGLOBIN A1C
Hgb A1c MFr Bld: 5.9 % — ABNORMAL HIGH (ref <5.7)
MEAN PLASMA GLUCOSE: 123 mg/dL — AB (ref <117)

## 2014-04-13 LAB — MAGNESIUM: MAGNESIUM: 1.9 mg/dL (ref 1.5–2.5)

## 2014-04-13 LAB — TSH: TSH: 2.411 u[IU]/mL (ref 0.350–4.500)

## 2014-04-13 NOTE — Progress Notes (Signed)
Assessment and Plan:  Hypertension: Continue medication, monitor blood pressure at home. Continue DASH diet.  Reminder to go to the ER if any CP, SOB, nausea, dizziness, severe HA, changes vision/speech, left arm numbness and tingling, and jaw pain. Cholesterol: Continue diet and exercise. Check cholesterol.  Pre-diabetes-Continue diet and exercise. Check A1C Hypothyroidism-check TSH level, continue medications the same.  Vitamin D Def- check level and continue medications.  ? Co2 narcosis- get CPAP titrated, if CBC elevated get on ABX, follow up [place], check TSH, get back on fluid pills.   Continue diet and meds as discussed. Further disposition pending results of labs. OVER 40 minutes of exam, counseling, chart review, referral performed  HPI 80 y.o. male  presents for 3 month follow up with hypertension, hyperlipidemia, prediabetes and vitamin D.  His blood pressure has not been controlled at home, he has not been taking his fluid pill, today their BP is BP: 148/80 mmHg He does not workout. He denies chest pain, shortness of breath, dizziness.  He is on cholesterol medication and denies myalgias. His cholesterol is at goal. The cholesterol last visit was:   Lab Results  Component Value Date   CHOL 135 12/15/2013   HDL 41 12/15/2013   LDLCALC 74 12/15/2013   TRIG 100 12/15/2013   CHOLHDL 3.3 12/15/2013   He has been working on diet and exercise for prediabetes, and denies paresthesia of the feet, polydipsia and polyuria. Last A1C in the office was:  Lab Results  Component Value Date   HGBA1C 5.7* 12/15/2013   Patient is on Vitamin D supplement.   Lab Results  Component Value Date   VD25OH 18 12/15/2013     He is on thyroid medication. His medication was not changed last visit. Patient denies nervousness, palpitations and weight changes.  Lab Results  Component Value Date   TSH 3.480 12/15/2013  .  Patient has chronic Afib, controlled with diltiazem, not on anticoagulation due to  fall risk.  For the past month he has been having increasing fatigue. He states that if he is left alone he will fall asleep, if he is talking with someone or interactive he will stay awake. He is wearing his CPAP, sleep 6-8 hours a night, he states his last titration was 10 years ago. He is on tylenol #3, he does not take this often but he has been taking 3 a week due to pain in his left shoulder. He is also on chlorpheniramine, 1 in the AM and 2 at night. He gets winded with walking 50 feet but states this is from the pain in his feet, he can get on a bike and bike for 20-40mins. Weight is down, denies PND, orthopnea.   Current Medications:  Current Outpatient Prescriptions on File Prior to Visit  Medication Sig Dispense Refill  . acetaminophen-codeine (TYLENOL #3) 300-30 MG per tablet Take 1 tablet by mouth every 4 (four) hours as needed for moderate pain.       Marland Kitchen aspirin 81 MG tablet Take 1 tablet 3 days per week      . azelastine (ASTELIN) 137 MCG/SPRAY nasal spray Place 1 spray into the nose at bedtime.       Marland Kitchen b complex vitamins tablet Take 1 tablet by mouth every morning.       . budesonide-formoterol (SYMBICORT) 160-4.5 MCG/ACT inhaler Inhale 2 puffs into the lungs 2 (two) times daily.  3 Inhaler  2  . chlorpheniramine (CHLOR-TRIMETON) 4 MG tablet Take 1 tablet in the  morning and 2 tablets at bedtime      . Cholecalciferol (VITAMIN D) 2000 UNITS CAPS Take 1 capsule by mouth daily.       Marland Kitchen diltiazem (DILACOR XR) 240 MG 24 hr capsule Take 1 capsule (240 mg total) by mouth daily.  180 capsule  1  . docusate sodium (COLACE) 100 MG capsule Take 100 mg by mouth 2 (two) times daily.        . fish oil-omega-3 fatty acids 1000 MG capsule Take 1 g by mouth 2 (two) times daily.        . furosemide (LASIX) 40 MG tablet Take 40 mg by mouth daily.       . Glucosamine HCl 1000 MG TABS Take 1 tablet by mouth daily.        Marland Kitchen guaifenesin (HUMIBID E) 400 MG TABS Take 400 mg by mouth 2 (two) times daily.         Marland Kitchen HYDROcodone-acetaminophen (NORCO/VICODIN) 5-325 MG per tablet Take 1 tablet by mouth every 6 (six) hours as needed for moderate pain (Only takes during CT/MRI testing).       Marland Kitchen levothyroxine (SYNTHROID, LEVOTHROID) 100 MCG tablet Take 100 mcg by mouth daily before breakfast.      . losartan (COZAAR) 100 MG tablet Take 1 tablet (100 mg total) by mouth daily.  90 tablet  4  . Minoxidil (LONITEN PO) Take 2.5 mg by mouth at bedtime.      Marland Kitchen omeprazole (PRILOSEC) 40 MG capsule Take 40 mg by mouth 2 (two) times daily.      Marland Kitchen PROVIGIL 200 MG tablet Take 1/2 tablet by mouth on Wed and Sun when needed (when pt drives)      . tiotropium (SPIRIVA) 18 MCG inhalation capsule Place 18 mcg into inhaler and inhale daily.       No current facility-administered medications on file prior to visit.   Medical History:  Past Medical History  Diagnosis Date  . HTN (hypertension)   . Diastolic dysfunction   . Atrial fibrillation   . GERD (gastroesophageal reflux disease)   . Allergic rhinitis   . Diverticulosis of colon     recurrent GI bleeding  . OSA (obstructive sleep apnea)     compliants w/ CPAP  . Hyperplastic colon polyp 2003  . Chronic gastritis   . Positive PPD     remote  . Atrial flutter   . Acute bronchitis   . Anemia     iron deficient  . Shortness of breath   . Venous insufficiency   . Moderate aortic insufficiency   . DJD (degenerative joint disease)   . COPD (chronic obstructive pulmonary disease)   . Hypothyroidism   . Adenomatous colon polyp    Allergies:  Allergies  Allergen Reactions  . Ace Inhibitors Cough  . Beta Adrenergic Blockers Other (See Comments)    bradycardia  . Fluticasone-Salmeterol     Unable to remember per pt  . Hytrin [Terazosin]     Nasal congestion   . Prednisone     Shakes / tremors  . Vioxx [Rofecoxib] Other (See Comments)    dyspepsia     Review of Systems:see HPI  Family history- Review and unchanged Social history- Review and  unchanged Physical Exam: BP 148/80  Pulse 76  Temp(Src) 98 F (36.7 C)  Resp 16  Ht 5\' 9"  (1.753 m)  Wt 249 lb (112.946 kg)  BMI 36.75 kg/m2 Wt Readings from Last 3 Encounters:  04/13/14 249 lb (  112.946 kg)  03/14/14 253 lb 8 oz (114.987 kg)  03/06/14 251 lb (113.853 kg)   General Appearance: Well nourished, in no apparent distress. Eyes: PERRLA, EOMs, conjunctiva no swelling or erythema Sinuses: No Frontal/maxillary tenderness ENT/Mouth: Ext aud canals clear, TMs without erythema, bulging. No erythema, swelling, or exudate on post pharynx.  Tonsils not swollen or erythematous. Hearing decrease Neck: Supple, thyroid normal.  Respiratory: Respiratory effort normal, right rhochi, worse than left rhonci without rales,  wheezing or stridor.02 96% RA Cardio: Irreg Irreg with holosystolic murmur. Brisk peripheral pulses with 1-2+ edema.  Abdomen: Soft, + BS, obese Non tender, no guarding, rebound, hernias, masses. Lymphatics: Non tender without lymphadenopathy.  Musculoskeletal: Full ROM, 5/5 strength, normal unsteady/antalgic  Skin: Warm, dry without rashes, lesions, ecchymosis.  Neuro: Cranial nerves intact. Normal muscle tone, no cerebellar symptoms.  Psych: Awake and oriented X 3, normal affect, Insight and Judgment appropriate.    Vicie Mutters, PA-C 2:22 PM [facility]

## 2014-04-25 ENCOUNTER — Ambulatory Visit: Payer: Medicare Other

## 2014-04-25 ENCOUNTER — Ambulatory Visit (INDEPENDENT_AMBULATORY_CARE_PROVIDER_SITE_OTHER): Payer: Medicare Other

## 2014-04-25 DIAGNOSIS — B351 Tinea unguium: Secondary | ICD-10-CM

## 2014-04-25 DIAGNOSIS — M79673 Pain in unspecified foot: Secondary | ICD-10-CM

## 2014-04-25 NOTE — Progress Notes (Signed)
   Subjective:    Patient ID: Richard Rosales, male    DOB: [birth year], 80 y.o.   MRN: [id number]  HPI  Patient presents for nail debridement  Review of Systemsno new findings or systemic changes noted     Objective:   Physical Exam Neurovascular status unchanged pedal pulses DP +2 PT plus one over 4 Refill time 3 seconds there is decreased epicritic sensation and Semmes-Weinstein to the forefoot and digits. I will discolored brittle crumbly nails 1 through 5 bilateral. Biomechanical exam otherwise unremarkable no open wounds no ulcers no secondary infections       Assessment & Plan:  Assessment this time is onychomycosis painful mycotic nails thick brittle crumbly friable 1 through 5 bilateral debrided at this time recommended continue with a topical Fungi-Nail as instructed check again in 3 months for follow-up and continued palliative nail care in the future. Contact us any changes or exacerbations occur next  Harriet Masson DPM

## 2014-05-12 ENCOUNTER — Telehealth: Payer: Self-pay | Admitting: Emergency Medicine

## 2014-05-12 MED ORDER — BUDESONIDE-FORMOTEROL FUMARATE 160-4.5 MCG/ACT IN AERO: 2.0000 | INHALATION_SPRAY | Freq: Two times a day (BID) | RESPIRATORY_TRACT | Status: DC

## 2014-05-12 NOTE — Telephone Encounter (Signed)
Called pt. Line rang numerous times and NA, no VM [facility] RX sent in

## 2014-05-15 NOTE — Telephone Encounter (Signed)
Patient notified that Rx was sent in. Nothing further needed.

## 2014-05-15 NOTE — Telephone Encounter (Signed)
ATCx 2 - First attempt line was busy and second attempt no answer.  [facility]

## 2014-05-23 ENCOUNTER — Other Ambulatory Visit: Payer: Self-pay | Admitting: Internal Medicine

## 2014-05-23 MED ORDER — MINOXIDIL 2.5 MG PO TABS
2.5000 mg | ORAL_TABLET | Freq: Every day | ORAL | Status: DC
Start: 2014-05-23 — End: 2015-03-01

## 2014-05-25 ENCOUNTER — Ambulatory Visit (INDEPENDENT_AMBULATORY_CARE_PROVIDER_SITE_OTHER): Payer: Medicare Other | Admitting: Emergency Medicine

## 2014-05-25 ENCOUNTER — Encounter: Payer: Self-pay | Admitting: Emergency Medicine

## 2014-05-25 DIAGNOSIS — J449 Chronic obstructive pulmonary disease, unspecified: Secondary | ICD-10-CM

## 2014-05-25 DIAGNOSIS — R062 Wheezing: Secondary | ICD-10-CM

## 2014-05-25 MED ORDER — PREDNISONE 10 MG PO TABS: ORAL_TABLET | ORAL | Status: DC

## 2014-05-25 MED ORDER — CLARITHROMYCIN 500 MG PO TABS: 500.0000 mg | ORAL_TABLET | Freq: Two times a day (BID) | ORAL | Status: DC

## 2014-05-25 NOTE — Patient Instructions (Signed)
Please take prednisone and clarithromycin as directed Continue her Spiriva and Symbicort as you have been taking them Wearing your CPAP every night Follow with Dr Lamonte Sakai in 2 months or sooner if you have any problems.

## 2014-05-25 NOTE — Progress Notes (Signed)
Subjective:  Patient ID: Richard Rosales, male    DOB: [birth year], 80 y.o.   MRN: [id number]  80 yo man, former smoker(quit'80)  For FU of COPD &  OSA on CPAP 12 cm. He has  hx of HTN, A Fib, chronic diastolic dysfxn and LE edema, GERD, diverticulosis, remote positive PPD.   TTE with preserved LVEF, PASP 34, not able to comment on diastolic fxn. PFTs 5/11  Showed moderate airway obstruction with FEv1 55% & good BD respnse - improved to 67%  09/15/2011 Follow up  Patient returns for a two-week followup visit. Last visit had a COPD exacerbation treated with antibiotics and steroids. Patient has had 2 COPD flares over last couple months. He feels that he is back to his baseline. He denies any chest pain, orthopnea, PND, increased leg swelling, hemoptysis, or fever. Currently taking Symbicort 2 puffs twice daily.  ROV 11/13/11 -- Hx OSA, COPD on Symbicort. Also A flutter, chronic diastolic CHF. He saw TP in March and Spiriva was added to Symbicort, he believes it has helped. He has had coughing for the last 3 weeks, one day had nausea. He has had a lot of mucous and drainage. Still using fluticasone nasal spray, loratadine. Not using Otis right now.   ROV 02/16/12 -- Hx OSA, COPD on Symbicort. Also A flutter, chronic diastolic CHF.  He is off the loratadine and fluticasone, he is using chlorpheniramine q6h. He continues to have paroxysms. He has been on allergy shots in the past, not recently. He is interested in going to Dr Orvil Feil. He tells me that breathing is overall the same. He is still limited in his exertion. Still on Symbicort and spiriva, no pred or abx since last time.  Reliable with CPAP.   ROV 03/22/12 -- Hx OSA, COPD on Symbicort and Spiriva. Also A flutter, chronic diastolic CHF.  He is having more mucous and cough. Reassuring eval by Dr Orvil Feil, better drainage with astelin/fluticasone. He is wondering if Symbicort is bothering his cough.   ROV 08/26/12 -- Hx OSA, COPD on Symbicort and Spiriva.  Also A flutter, chronic diastolic CHF. Chronic rhinitis, has been seen by Dr Orvil Feil. Having cough, nasal congestion and drainage. He is doing NSW bid, no longer on nasal steroid or astelin. He uses chlortrimeton, not currently on loratadine or any anti-histamine. Functional capacity limited by his arthritis. Never uses SABA.   ROV 12/02/12 -- OSA, COPD on Symbicort and Spiriva. Also A flutter, chronic diastolic CHF. Chronic rhinitis, doing NSW's. He restarted fluticasone and allegra. Not currently doing chlortrimeton. No AE's since last time. He has a SABA, has never needed to use. Good compliance w CPAP. No fevers.   Acute OV  Complains of increased SOB, prod cough with clear mucus, some wheezing, throat congestion, head congestion w/ PND  - worse x3 weeks.  denies f/c/s, increased edema. No otc used.  No fever, orthopnea, chest pain, n/v.  Wears CPAP each night.   ROV 03/30/13 -- OSA, COPD on Symbicort and Spiriva. Also A flutter, chronic diastolic CHF. He saw Dr Gwenette Greet 02/25/13 for persistent sx after a COPD flare.  It is very difficult for him to take corticosteroids, gets side effects of tremor, aches, mood swings, etc. He changed allegra to chlorpheniramine, seems to be working better. He is sleeping better, has some wheeze that seems to be UA. His GERD is treated.   ROV 07/08/13 -- OSA, COPD on Symbicort and Spiriva. Also A flutter, chronic diastolic CHF.  Was seen by  VS in 12/14 for an AE-COPD. He is having fatigue, tiredness with heavier exertion. He is still having some cough, nasal drainage. He cut his omeprazole to once a day about 1.5 months ago. Just increased back to bid today.   ROV 09/06/13 -- Hx of OSA, COPD, A flutter, chronic diastolic CHF. He is on CPAP, uses Spiriva and Symbicort. He is SOB with exertion. He has not needed his SABA.  No flares. Able to wear CPAP every night. Has an am cough, usually clear.   ROV 11/24/13 -- Hx of OSA, COPD, A flutter, chronic diastolic CHF. He  unfortunately had a fall recently, had to be admitted. A Ct chest was done and found several other findings that need to be evaluated - cardiomegaly, ? Hepatic enlargement / cirrhosis, thyroid nodules. He is having a hacking cough, wakes him up at night, can also happen during the day. He is doing well with his CPAP. He is on spiriva and symbicort. Takes omeprazole bid.   ROV 01/26/14 -- OSA, COPD, A flutter, chronic diastolic CHF. Has a chronic cough and rhinitis.  He returns feeling badly, lots of rhinitis and congestion. He remains on Spiriva and Symbicort. He is coughing up yellow mucous, bad taste.   ROV 03/14/14 -- hx OSA compliant with CPAP, COPD, A flutter, chronic diastolic CHF. Has a chronic cough and rhinitis.  He was treated beginning of August for an AE. He has been having more trouble with congestion over the last 2 weeks since he ran out of chlortrimeton. He is planning to get more.   ROV 05/25/14 -- follow up visit for COPD with chronic bronchitis, obstructive sleep apnea, atrial flutter and diastolic CHF.  Over the last 2-3 weeks he has been very tired, sleeping in on occasion. Having exertional SOB. Having some congestion, a lot of cough and chest mucous. Wears his CPAP reliably.    Filed Vitals:   05/25/14 1646  BP: 140/88  Pulse: 83  Height: 5\' 9"  (1.753 m)  Weight: 259 lb (117.482 kg)  SpO2: 96%    Exam:  GEN: A/Ox3; pleasant , NAD,  obese kyphotic.   HEENT:  [place]/AT,  EACs-clear, TMs-wnl, NOSE-clear drainage , THROAT-clear, no lesions, no postnasal drip or exudate noted.   NECK:  Supple w/ fair ROM; no JVD; normal carotid impulses w/o bruits; no thyromegaly or nodules palpated; no lymphadenopathy.  RESP B loud expiratory wheezes. Coarse sounds with cough  CARD:  RRR, no m/r/g  , 1+ peripheral edema, pulses intact, no cyanosis or clubbing.  Musco: Warm bil, no deformities or joint swelling noted.   Neuro: alert, no focal deficits noted.    Skin: Warm, no lesions or  rashes    11/18/13 --  COMPARISON: No prior CT chest. Multiple prior chest x-rays, most  recently 11/17/2013.  FINDINGS:  Contrast opacification of the pulmonary arteries is excellent. No  filling defects within either main pulmonary artery or their  branches in either lung to suggest pulmonary embolism. Heart  markedly enlarged with left ventricular predominance and evidence of  left ventricular hypertrophy. Small pericardial effusion. Moderate 3  vessel coronary atherosclerosis. Severe atherosclerosis involving  the thoracic and upper abdominal aorta and their visualized branches  without evidence of aneurysm. Tortuous thoracic and upper abdominal  aorta.  Emphysematous changes throughout both lungs. Subsegmental airspace  consolidation with air bronchograms in the deep posterior right  lower lobe. Lungs otherwise clear. No pulmonary parenchymal nodules  or masses. No pleural effusions.  Thyroid gland enlargement  with multiple nodules, the largest nodules  adjacent to one another in the right lobe measuring approximately  2.5 x 2.2 x 3.1 cm in the mid pole and 3.0 x 3.0 x 2.1 cm in the  lower pole. Scattered normal size lymph nodes in the mediastinum,  hila, and axilla without significant lymphadenopathy. Mild bilateral  gynecomastia.  Irregularity involving the contour of the visualized liver with  relative enlargement of the left lobe. Cysts involving the mid  portions of the visualized kidneys. Moderate pancreatic atrophy.  Hiatal hernia containing predominately intra-abdominal fat.  Diverticulosis involving the visualized distal transverse and  proximal descending colon without evidence of acute diverticulitis.  Bone window images demonstrated diffuse thoracic spondylosis and  exaggeration of the usual thoracic kyphosis.  Review of the MIP images confirms the above findings.   IMPRESSION:  1. No evidence of pulmonary embolism.  2. COPD/emphysema. Atelectasis involving the  right lower lobe. No  acute cardiopulmonary disease otherwise.  3. Multinodular goiter, with adjacent 3 cm nodules in the right  lobe.  4. Hepatic cirrhosis.  5. Moderate pancreatic atrophy.  6. Diverticulosis involving the visualized distal transverse and  proximal descending colon.  7. Cardiomegaly with 3 vessel coronary artery disease. Left  ventricular hypertrophy. Small pericardial effusion.  8. Mild bilateral gynecomastia   Assessment & Plan:   COPD (chronic obstructive pulmonary disease) Acute on chronic bronchitis. He flares frequently, has purulent mucus, wheezing.  - Continue current BD - pred taper - start low since he has side effects  - clarithro x 7 days

## 2014-05-25 NOTE — Assessment & Plan Note (Signed)
Acute on chronic bronchitis. He flares frequently, has purulent mucus, wheezing.  - Continue current BD - pred taper - start low since he has side effects  - clarithro x 7 days

## 2014-05-26 MED ORDER — LEVALBUTEROL HCL 0.63 MG/3ML IN NEBU
0.6300 mg | INHALATION_SOLUTION | Freq: Once | RESPIRATORY_TRACT | Status: AC
  Administered 2014-05-25: 0.63 mg via RESPIRATORY_TRACT

## 2014-06-13 ENCOUNTER — Telehealth: Payer: Self-pay | Admitting: Internal Medicine

## 2014-06-13 NOTE — Telephone Encounter (Addendum)
Patient has been on twice daily for years  [facility] sent in #180 but on RX only said take daily  I have spoken with the patient and he says has been on 2 daily for years.  I have spent over an hour on the phone with [facility] to see if they can refund him the money for the extra pills he will need.  His co-pay for 90 or 180 is $12.00.  A supervisor sent in a request to have this charge refunded but we will not know the outcome for 30 days. Spoke with his granddaughter and let her know all the above.  I have asked that he call me back with any other questions

## 2014-06-13 NOTE — Telephone Encounter (Signed)
New Msg    Pt calling, states he normally takes medication diltiazem twice daily with a 90 day supply, but has new prescription that is 90 supply once daily.   Has his dosage changed? Please call

## 2014-06-14 ENCOUNTER — Encounter: Payer: Self-pay | Admitting: Internal Medicine

## 2014-06-14 MED ORDER — DILTIAZEM HCL ER 240 MG PO CP24: 240.0000 mg | ORAL_CAPSULE | Freq: Two times a day (BID) | ORAL | Status: DC

## 2014-06-19 ENCOUNTER — Encounter: Payer: Self-pay | Admitting: Internal Medicine

## 2014-06-20 ENCOUNTER — Encounter: Payer: Self-pay | Admitting: Internal Medicine

## 2014-06-22 ENCOUNTER — Telehealth: Payer: Self-pay

## 2014-06-22 NOTE — Telephone Encounter (Signed)
Spoke with this patient about a recent refill error on our part. We did not refill him for enough medication and [facility] was charging him additional fees to complete. I apologized and verified his address to send him a gift card for his trouble. He was appreciative of the call and our concern. GL 3 $5 gift cards sent to the patient via [employer].

## 2014-06-27 ENCOUNTER — Encounter: Payer: Self-pay | Admitting: Emergency Medicine

## 2014-06-27 ENCOUNTER — Ambulatory Visit (INDEPENDENT_AMBULATORY_CARE_PROVIDER_SITE_OTHER): Payer: Medicare Other | Admitting: Emergency Medicine

## 2014-06-27 VITALS — BP 158/90 | HR 69 | Temp 97.8°F | Ht 70.0 in | Wt 259.4 lb

## 2014-06-27 DIAGNOSIS — J449 Chronic obstructive pulmonary disease, unspecified: Secondary | ICD-10-CM

## 2014-06-27 MED ORDER — ALBUTEROL SULFATE (2.5 MG/3ML) 0.083% IN NEBU
2.5000 mg | INHALATION_SOLUTION | RESPIRATORY_TRACT | Status: DC | PRN
Start: 2014-06-27 — End: 2014-06-27

## 2014-06-27 MED ORDER — ALBUTEROL SULFATE (2.5 MG/3ML) 0.083% IN NEBU: 2.5000 mg | INHALATION_SOLUTION | RESPIRATORY_TRACT | Status: DC | PRN

## 2014-06-27 NOTE — Patient Instructions (Signed)
Please continue your Spiriva and her Symbicort as you have been taking them We will order albuterol that can be given through a nebulizer. You may use this up to every 4 hours if needed for shortness of breath or mucous clearance Your walking oximetry test today shows that you qualify for oxygen. We will start oxygen at 2L/min for you to use with exertion.  Follow with Dr Lamonte Sakai in 2 months or sooner if you have any problems.

## 2014-06-27 NOTE — Assessment & Plan Note (Signed)
He has improved since his acute exacerbation last time but he does have persistent loud wheezing and frequent symptoms consistent with chronic bronchitis. He is a patient that may benefit from chronic low-dose prednisone but he notes that he has terrible side effects from this including mood swings. At this time we will assess him for ambulatory O2, obtain a nebulizer for albuterol. Continue same long-term BD's  Please continue your Spiriva and her Symbicort as you have been taking them We will order albuterol that can be given through a nebulizer. You may use this up to every 4 hours if needed for shortness of breath or mucous clearance Your walking oximetry test today shows that you qualify for oxygen. We will start oxygen at 2L/min for you to use with exertion.  Follow with Dr Lamonte Sakai in 2 months or sooner if you have any problems.

## 2014-06-27 NOTE — Progress Notes (Signed)
Subjective:  Patient ID: Richard Rosales, male    DOB: 11-Mar-1931, 81 y.o.   MRN: [id number]  81 yo man, former smoker (quit'80)  For FU of COPD &  OSA on CPAP 12 cm. He has  hx of HTN, A Fib, chronic diastolic dysfxn and LE edema, GERD, diverticulosis, remote positive PPD.   ROV 07/08/13 -- OSA, COPD on Symbicort and Spiriva. Also A flutter, chronic diastolic CHF.  Was seen by VS in 12/14 for an AE-COPD. He is having fatigue, tiredness with heavier exertion. He is still having some cough, nasal drainage. He cut his omeprazole to once a day about 1.5 months ago. Just increased back to bid today.   ROV 09/06/13 -- Hx of OSA, COPD, A flutter, chronic diastolic CHF. He is on CPAP, uses Spiriva and Symbicort. He is SOB with exertion. He has not needed his SABA.  No flares. Able to wear CPAP every night. Has an am cough, usually clear.   ROV 11/24/13 -- Hx of OSA, COPD, A flutter, chronic diastolic CHF. He unfortunately had a fall recently, had to be admitted. A Ct chest was done and found several other findings that need to be evaluated - cardiomegaly, ? Hepatic enlargement / cirrhosis, thyroid nodules. He is having a hacking cough, wakes him up at night, can also happen during the day. He is doing well with his CPAP. He is on spiriva and symbicort. Takes omeprazole bid.   ROV 01/26/14 -- OSA, COPD, A flutter, chronic diastolic CHF. Has a chronic cough and rhinitis.  He returns feeling badly, lots of rhinitis and congestion. He remains on Spiriva and Symbicort. He is coughing up yellow mucous, bad taste.   ROV 03/14/14 -- hx OSA compliant with CPAP, COPD, A flutter, chronic diastolic CHF. Has a chronic cough and rhinitis.  He was treated beginning of August for an AE. He has been having more trouble with congestion over the last 2 weeks since he ran out of chlortrimeton. He is planning to get more.   ROV 05/25/14 -- follow up visit for COPD with chronic bronchitis, obstructive sleep apnea, atrial flutter and  diastolic CHF.  Over the last 2-3 weeks he has been very tired, sleeping in on occasion. Having exertional SOB. Having some congestion, a lot of cough and chest mucous. Wears his CPAP reliably.   ROV 06/27/14 -- hx of Severe COPD with associated chronic bronchitis that frequently exacerbates. He has OSA and is compliant with CPAP. Last time he had sx consistent with acute bronchitis, was treated with pred + clarithro. He has dyspnea with activity, chores that requires him to rest. He is still coughing, gets a hoarse voice, has to clear secretions every am. He had severe mood swings with prednisone.    Filed Vitals:   06/27/14 1500  BP: 158/90  Pulse: 69  Temp: 97.8 F (36.6 C)  TempSrc: Oral  Height: 5\' 10"  (1.778 m)  Weight: 259 lb 6.4 oz (117.663 kg)  SpO2: 90%   Exam:  GEN: A/Ox3; pleasant , NAD,  obese kyphotic.   HEENT:  [place]/AT,  EACs-clear, TMs-wnl, NOSE-clear drainage , THROAT-clear, no lesions, no postnasal drip or exudate noted.   NECK:  Supple w/ fair ROM; no JVD; normal carotid impulses w/o bruits; no thyromegaly or nodules palpated; no lymphadenopathy.  RESP B loud expiratory wheezes. Coarse sounds with cough  CARD:  RRR, no m/r/g  , 1+ peripheral edema, pulses intact, no cyanosis or clubbing.  Musco: Warm bil, no deformities or  joint swelling noted.   Neuro: alert, no focal deficits noted.    Skin: Warm, no lesions or rashes    11/18/13 --  COMPARISON: No prior CT chest. Multiple prior chest x-rays, most  recently 11/17/2013.  FINDINGS:  Contrast opacification of the pulmonary arteries is excellent. No  filling defects within either main pulmonary artery or their  branches in either lung to suggest pulmonary embolism. Heart  markedly enlarged with left ventricular predominance and evidence of  left ventricular hypertrophy. Small pericardial effusion. Moderate 3  vessel coronary atherosclerosis. Severe atherosclerosis involving  the thoracic and upper abdominal  aorta and their visualized branches  without evidence of aneurysm. Tortuous thoracic and upper abdominal  aorta.  Emphysematous changes throughout both lungs. Subsegmental airspace  consolidation with air bronchograms in the deep posterior right  lower lobe. Lungs otherwise clear. No pulmonary parenchymal nodules  or masses. No pleural effusions.  Thyroid gland enlargement with multiple nodules, the largest nodules  adjacent to one another in the right lobe measuring approximately  2.5 x 2.2 x 3.1 cm in the mid pole and 3.0 x 3.0 x 2.1 cm in the  lower pole. Scattered normal size lymph nodes in the mediastinum,  hila, and axilla without significant lymphadenopathy. Mild bilateral  gynecomastia.  Irregularity involving the contour of the visualized liver with  relative enlargement of the left lobe. Cysts involving the mid  portions of the visualized kidneys. Moderate pancreatic atrophy.  Hiatal hernia containing predominately intra-abdominal fat.  Diverticulosis involving the visualized distal transverse and  proximal descending colon without evidence of acute diverticulitis.  Bone window images demonstrated diffuse thoracic spondylosis and  exaggeration of the usual thoracic kyphosis.  Review of the MIP images confirms the above findings.   IMPRESSION:  1. No evidence of pulmonary embolism.  2. COPD/emphysema. Atelectasis involving the right lower lobe. No  acute cardiopulmonary disease otherwise.  3. Multinodular goiter, with adjacent 3 cm nodules in the right  lobe.  4. Hepatic cirrhosis.  5. Moderate pancreatic atrophy.  6. Diverticulosis involving the visualized distal transverse and  proximal descending colon.  7. Cardiomegaly with 3 vessel coronary artery disease. Left  ventricular hypertrophy. Small pericardial effusion.  8. Mild bilateral gynecomastia   Assessment & Plan:   COPD (chronic obstructive pulmonary disease) He has improved since his acute exacerbation last  time but he does have persistent loud wheezing and frequent symptoms consistent with chronic bronchitis. He is a patient that may benefit from chronic low-dose prednisone but he notes that he has terrible side effects from this including mood swings. At this time we will assess him for ambulatory O2, obtain a nebulizer for albuterol. Continue same long-term BD's  Please continue your Spiriva and her Symbicort as you have been taking them We will order albuterol that can be given through a nebulizer. You may use this up to every 4 hours if needed for shortness of breath or mucous clearance Your walking oximetry test today shows that you qualify for oxygen. We will start oxygen at 2L/min for you to use with exertion.  Follow with Dr Lamonte Sakai in 2 months or sooner if you have any problems.

## 2014-06-28 ENCOUNTER — Telehealth: Payer: Self-pay | Admitting: Emergency Medicine

## 2014-06-28 MED ORDER — ALBUTEROL SULFATE (2.5 MG/3ML) 0.083% IN NEBU: 2.5000 mg | INHALATION_SOLUTION | RESPIRATORY_TRACT | Status: DC

## 2014-06-28 NOTE — Telephone Encounter (Signed)
Spoke with pt - verified neb machine was received yesterday.  Explained below to pt.  Pt states he doesn't really want to have to go to [facility] to pick up medication.  This is very inconvenient for him.  He would like to get medication and this taken care of ASAP.  States he has an albuterol hfa to use if needed but would really feel more comfortable to have the neb and would like this ASAP/today if possible.  He is aware we will let him know of RB's response and would handle this in a timely manner.  RB, pls advise on below.  Thank you.

## 2014-06-28 NOTE — Telephone Encounter (Signed)
Fixed rx has been faxed to [place]. Pt is aware of this.

## 2014-06-28 NOTE — Telephone Encounter (Signed)
Pt returning call, states wife took call  and he is unsure what this call is about please call patient [phone] to inform.

## 2014-06-28 NOTE — Telephone Encounter (Signed)
Spoke with Leafy Ro with [facility].  Albuterol neb rx was printed yesterday for q4h prn for wheezing or SOB.  Per Leafy Ro, Medicare will not cover unless rx reads Q4H AND PRN.  Advised I could not make this change without RB's approval.  Mandy aware.  Per Leafy Ro, pt already has neb machine.    Per Leafy Ro, if albuterol med is ordered through them, pt will not pay anything out of pocket.  Medication cannot be sent to pt without this approval.  lmtcb with pt's wife to verify pt has a neb machine.  Dr. Lamonte Sakai, pls advise if you are ok with changing directions to q4h AND prn per Medicare Guidelines.  If directions are unable to be changed, pt can CASH PAY for this at [facility] for $4.  Please advise.  Thank you.  RB paged given pt does not currently have medication.

## 2014-06-28 NOTE — Telephone Encounter (Signed)
Yes it is ok to change the way the prescription reads.

## 2014-07-11 ENCOUNTER — Telehealth: Payer: Self-pay | Admitting: Emergency Medicine

## 2014-07-11 ENCOUNTER — Encounter: Payer: Self-pay | Admitting: Internal Medicine

## 2014-07-11 DIAGNOSIS — J449 Chronic obstructive pulmonary disease, unspecified: Secondary | ICD-10-CM

## 2014-07-11 NOTE — Telephone Encounter (Signed)
Called and spoke to [place], [place]. Leafy Ro is requesting POC and oxygen conserving device titration on behalf of the pt.   RB please advise if ok to place order.

## 2014-07-12 NOTE — Telephone Encounter (Signed)
Yes OK

## 2014-07-12 NOTE — Telephone Encounter (Signed)
Called and spoke to [place]. Informed Mandy of the rx. Order placed.    lmtcb for pt.

## 2014-07-13 NOTE — Telephone Encounter (Signed)
LMTC x `1 for pt 

## 2014-07-17 NOTE — Telephone Encounter (Signed)
lmtcb x2 

## 2014-07-19 NOTE — Telephone Encounter (Signed)
Attempted to call pt. No answer, no option to leave a message. Will try back later.

## 2014-07-19 NOTE — Telephone Encounter (Signed)
LMTCB x 3 on [facility] Doctor cell#  Called pt on home #, spoke with pt and made aware that orders have been placed and [facility] is aware. Pt is requesting st Dr Agustina Caroli nurse call him back, states that he has a question for his nurse, would not discuss with me. Will send to [place] to call pt back .

## 2014-07-20 NOTE — Telephone Encounter (Signed)
Pt is aware that order has been sent in. Nothing further was needed.

## 2014-07-26 ENCOUNTER — Encounter: Payer: Self-pay | Admitting: Internal Medicine

## 2014-07-26 ENCOUNTER — Ambulatory Visit (INDEPENDENT_AMBULATORY_CARE_PROVIDER_SITE_OTHER): Payer: Medicare Other | Admitting: Internal Medicine

## 2014-07-26 VITALS — BP 144/58 | HR 64 | Temp 99.1°F | Resp 20 | Ht 68.25 in | Wt 261.2 lb

## 2014-07-26 DIAGNOSIS — Z23 Encounter for immunization: Secondary | ICD-10-CM | POA: Diagnosis not present

## 2014-07-26 DIAGNOSIS — R7303 Prediabetes: Secondary | ICD-10-CM

## 2014-07-26 DIAGNOSIS — E782 Mixed hyperlipidemia: Secondary | ICD-10-CM

## 2014-07-26 DIAGNOSIS — E559 Vitamin D deficiency, unspecified: Secondary | ICD-10-CM | POA: Diagnosis not present

## 2014-07-26 DIAGNOSIS — I10 Essential (primary) hypertension: Secondary | ICD-10-CM | POA: Diagnosis not present

## 2014-07-26 DIAGNOSIS — I482 Chronic atrial fibrillation, unspecified: Secondary | ICD-10-CM

## 2014-07-26 DIAGNOSIS — G4733 Obstructive sleep apnea (adult) (pediatric): Secondary | ICD-10-CM

## 2014-07-26 DIAGNOSIS — Z125 Encounter for screening for malignant neoplasm of prostate: Secondary | ICD-10-CM | POA: Diagnosis not present

## 2014-07-26 DIAGNOSIS — Z79899 Other long term (current) drug therapy: Secondary | ICD-10-CM

## 2014-07-26 DIAGNOSIS — Z1212 Encounter for screening for malignant neoplasm of rectum: Secondary | ICD-10-CM

## 2014-07-26 DIAGNOSIS — Z1331 Encounter for screening for depression: Secondary | ICD-10-CM

## 2014-07-26 DIAGNOSIS — Z9181 History of falling: Secondary | ICD-10-CM

## 2014-07-26 DIAGNOSIS — R7309 Other abnormal glucose: Secondary | ICD-10-CM | POA: Diagnosis not present

## 2014-07-26 DIAGNOSIS — E039 Hypothyroidism, unspecified: Secondary | ICD-10-CM

## 2014-07-26 NOTE — Progress Notes (Signed)
Patient ID: Richard Rosales, male   DOB: Nov 17, 1930, 81 y.o.   MRN: [id number]  MEDICARE ANNUAL WELLNESS VISIT AND CPE  Assessment:   1. Essential hypertension  - Microalbumin / creatinine urine ratio - EKG 12-Lead - [place], RETROPERITNL ABD,  LTD - TSH  2. Chronic atrial fibrillation   3. Obstructive sleep apnea   4. Mixed hyperlipidemia  - Lipid panel  5. Obesity, morbid   6. Prediabetes  - Hemoglobin A1c - Insulin, fasting  7. Hypothyroidism, unspecified hypothyroidism type   8. Vitamin D deficiency  - Vit D  25 hydroxy (rtn osteoporosis monitoring)  9. Screening for rectal cancer  - POC Hemoccult Bld/Stl (3-Cd Home Screen); Future  10. Prostate cancer screening  - PSA  11. Depression screen   12. At low risk for fall   13. Encounter for long-term (current) use of medications  - CBC with Differential/Platelet - BASIC METABOLIC PANEL WITH GFR - Hepatic function panel - Magnesium - Urine Microscopic; Future  14. Need for prophylactic vaccination with tetanus-diphtheria (TD)  - DT Vaccine greater than 7yo IM  Plan:   During the course of the visit the patient was educated and counseled about appropriate screening and preventive services including:    Pneumococcal vaccine   Influenza vaccine  Td vaccine  Screening electrocardiogram  Bone densitometry screening  Colorectal cancer screening  Diabetes screening  Glaucoma screening  Nutrition counseling   Advanced directives: requested  Screening recommendations, referrals: Vaccinations: DT vaccine 06/2003 -- 07/26/2014 Influenza vaccine HD 04/13/2014 Pneumococcal vaccine 05/28/2010 Prevnar vaccine declined Shingles vaccine 09/14/2013 Hep B vaccine not indicated  Nutrition assessed and recommended  Colonoscopy 02/21/2009 Recommended yearly ophthalmology/optometry visit for glaucoma screening and checkup Recommended yearly dental visit for hygiene and checkup Advanced directives -  yes  Conditions/risks identified: BMI: Discussed weight loss, diet, and increase physical activity.  Increase physical activity: AHA recommends 150 minutes of physical activity a week.  Medications reviewed Patient has Morbid Obesity (BMI 29.4) and consequent PreDiabetes is at goal, ACE/ARB therapy: Yes. Urinary Incontinence is not an issue: discussed non pharmacology and pharmacology options.  Fall risk: low- discussed PT, home fall assessment, medications.    Subjective:  Richard Rosales is a 81 y.o. MWM who presents for Medicare Annual Wellness Visit and complete physical.  Date of last medicare wellness visit is unknown.  He has had elevated blood pressure since 1998. His blood pressure has been controlled at home, today their BP is BP: (!) 144/58 mmHg He does not workout. He denies chest pain, shortness of breath, dizziness.  He is not on cholesterol medication and denies myalgias. His cholesterol is at goal. The cholesterol last visit was:  Lab Results  Component Value Date   CHOL 143 04/13/2014   HDL 55 04/13/2014   LDLCALC 72 04/13/2014   TRIG 82 04/13/2014   CHOLHDL 2.6 04/13/2014   He has had prediabetes for 3 years since Dec 2012 - A1c 6.4%. He has not been working on diet and exercise for prediabetes, and denies foot ulcerations, hyperglycemia, nausea, paresthesia of the feet, polydipsia, polyuria and visual disturbances. Last A1C in the office was:  Lab Results  Component Value Date   HGBA1C 5.9* 04/13/2014   Patient is on Vitamin D supplement.   Lab Results  Component Value Date   VD25OH 57 12/15/2013      Names of Other Physician/Practitioners you currently use: 1. [facility] here for primary care 2. Dr Katy Fitch, eye doctor,  last visit 2015 3. Dr Elwanda [place], dentist, last visit 6 months ago  Patient Care Team: Unk Pinto, MD as PCP - General (Internal Medicine) Collene Gobble, MD as Consulting Physician (Pulmonary  Disease) Inda Castle, MD as Consulting Physician (Gastroenterology) Thompson Grayer, MD as Consulting Physician (Cardiology)  Medication Review: Medication Sig  . acetaminophen-codeine (TYLENOL #3) 300-30 MG per tablet Take 1 tablet by mouth every 4 (four) hours as needed for moderate pain.   Marland Kitchen albuterol (PROVENTIL) (2.5 MG/3ML) 0.083% nebulizer solution Take 3 mLs (2.5 mg total) by nebulization every 4 (four) hours.  Marland Kitchen aspirin 81 MG tablet Take 1 tablet 3 days per week  . azelastine (ASTELIN) 137 MCG/SPRAY nasal spray Place 1 spray into the nose at bedtime.   Marland Kitchen b complex vitamins tablet Take 1 tablet by mouth every morning.   . budesonide-formoterol (SYMBICORT) 160-4.5 MCG/ACT inhaler Inhale 2 puffs into the lungs 2 (two) times daily.  . chlorpheniramine (CHLOR-TRIMETON) 4 MG tablet Take 1 tablet in the morning and 2 tablets at bedtime  . Cholecalciferol (VITAMIN D) 2000 UNITS CAPS Take 1 capsule by mouth daily.   . clarithromycin (BIAXIN) 500 MG tablet Take 1 tablet (500 mg total) by mouth 2 (two) times daily.  Marland Kitchen diltiazem (DILACOR XR) 240 MG 24 hr capsule Take 1 capsule (240 mg total) by mouth 2 (two) times daily.  Marland Kitchen docusate sodium (COLACE) 100 MG capsule Take 100 mg by mouth 2 (two) times daily.    . fish oil-omega-3 fatty acids 1000 MG capsule Take 1 g by mouth 2 (two) times daily.    . furosemide (LASIX) 40 MG tablet Take 40 mg by mouth daily.   . Glucosamine HCl 1000 MG TABS Take 1 tablet by mouth daily.    Marland Kitchen guaifenesin (HUMIBID E) 400 MG TABS Take 400 mg by mouth 2 (two) times daily.    Marland Kitchen HYDROcodone-acetaminophen (NORCO/VICODIN) 5-325 MG per tablet Take 1 tablet by mouth every 6 (six) hours as needed for moderate pain (Only takes during CT/MRI testing).   Marland Kitchen levothyroxine (SYNTHROID, LEVOTHROID) 100 MCG tablet Take 100 mcg by mouth daily before breakfast.  . losartan (COZAAR) 100 MG tablet Take 1 tablet (100 mg total) by mouth daily.  . minoxidil (LONITEN) 2.5 MG tablet Take 1  tablet (2.5 mg total) by mouth daily. For BP  . omeprazole (PRILOSEC) 40 MG capsule Take 40 mg by mouth 2 (two) times daily.  Marland Kitchen PROVIGIL 200 MG tablet Take 1/2 tablet by mouth on Wed and Sun when needed (when pt drives)  . tiotropium (SPIRIVA) 18 MCG inhalation capsule Place 18 mcg into inhaler and inhale daily.   Current Problems (verified) Patient Active Problem List   Diagnosis Date Noted  . Hypertensive cardiovascular disease 03/06/2014  . Mixed hyperlipidemia 12/15/2013  . Prediabetes 12/15/2013  . Vitamin D deficiency 12/15/2013  . Encounter for long-term (current) use of medications 12/15/2013  . DJD (degenerative joint disease)   . Allergic rhinitis   . Hypothyroidism   . Obesity, morbid 06/13/2013  . Rotator cuff tear, right 05/08/2011  . COPD (chronic obstructive pulmonary disease) 11/01/2009  . Obstructive sleep apnea 10/04/2009  . Chronic diastolic heart failure 40/98/1191  . Diverticulosis of colon with hemorrhage 09/10/2009  . Personal history of colonic polyps 09/10/2009  . Atrial fibrillation 09/11/2008  . Essential hypertension 09/08/2008  . GERD 09/08/2008  . BENIGN PROSTATIC HYPERTROPHY, HX OF 09/08/2008   Screening Tests Health Maintenance  Topic Date Due  . COLONOSCOPY  11/19/1982  . TETANUS/TDAP  06/23/2013  . INFLUENZA VACCINE  01/22/2015  . PNEUMOCOCCAL POLYSACCHARIDE VACCINE AGE 81 AND OVER  Completed  . ZOSTAVAX  Completed   Immunization History  Administered Date(s) Administered  . DT 07/26/2014  . DTaP 06/24/2003  . Influenza Split 04/24/2011, 03/23/2012, 03/10/2013  . Influenza Whole 03/23/2009, 03/23/2010  . Influenza, High Dose Seasonal PF 04/13/2014  . Pneumococcal Polysaccharide-23 05/23/1994, 11/22/2001, 05/28/2010  . Tdap 06/24/2003  . Zoster 09/14/2013   Preventative care: Last colonoscopy: 9//06/2008 Prior vaccinations: TD or: 07/26/2014  Influenza: HD 04/13/2014  Pneumococcal: 05/28/2010 Shingles/Zostavax: 09/14/2013  History  reviewed: allergies, current medications, past family history, past medical history, past social history, past surgical history and problem list  Risk Factors: Tobacco History  Substance Use Topics  . Smoking status: Former Smoker -- 1.00 packs/day for 20 years    Types: Cigarettes    Quit date: 08/01/1978  . Smokeless tobacco: Never Used  . Alcohol Use: Yes     Comment: about 3 to 4 times a week   He does not smoke.  Patient is a former smoker. Are there smokers in your home (other than you)?  No  Alcohol Current alcohol use: social drinker  Caffeine Current caffeine use: tea 0-1 glass /day  Exercise Current exercise: none  Nutrition/Diet Current diet: in general, an "unhealthy" diet  Cardiac risk factors: advanced age (older than 35 for men, 59 for women), dyslipidemia, hypertension, male gender, obesity (BMI >= 30 kg/m2) and sedentary lifestyle.  Depression Screen (Note: if answer to either of the following is "Yes", a more complete depression screening is indicated)   Q1: Over the past two weeks, have you felt down, depressed or hopeless? No  Q2: Over the past two weeks, have you felt little interest or pleasure in doing things? No  Have you lost interest or pleasure in daily life? No  Do you often feel hopeless? No  Do you cry easily over simple problems? No  Activities of Daily Living In your present state of health, do you have any difficulty performing the following activities?:  Driving? No Managing money?  No Feeding yourself? No Getting from bed to chair? No Climbing a flight of stairs? No Preparing food and eating?: No Bathing or showering? No Getting dressed: No Getting to the toilet? No Using the toilet:No Moving around from place to place: No In the past year have you fallen or had a near fall?:No   Are you sexually active?  No  Do you have more than one partner?  No  Vision Difficulties: No  Hearing Difficulties: No Do you often ask people  to speak up or repeat themselves? No Do you experience ringing or noises in your ears? No Do you have difficulty understanding soft or whispered voices? No  Cognition  Do you feel that you have a problem with memory?No  Do you often misplace items? No  Do you feel safe at home?  Yes  Advanced directives Does patient have a [place]? Yes Does patient have a Living Will? Yes  ROS: In addition to the HPI above,  No Fever-chills,  No Headache, No changes with Vision or hearing,  No problems swallowing food or Liquids,  No Chest pain or productive Cough or Shortness of Breath,  No Abdominal pain, No Nausea or Vomitting, Bowel movements are regular,  No Blood in stool or Urine,  No dysuria,  No new skin rashes or bruises,  No new joints pains-aches,  No new weakness, tingling, numbness in any extremity,  No recent weight loss,  No polyuria, polydypsia or polyphagia,  No significant Mental Stressors.  A full 10 point Review of Systems was done, except as stated above, all other Review of Systems were negative  Objective:     Blood pressure 144/58, pulse 64, temperature 99.1 F (37.3 C), resp. rate 20, height 5' 8.25" (1.734 m), weight 261 lb 3.2 oz (118.48 kg). Body mass index is 39.4 kg/(m^2).  General appearance: alert, no distress, WD/WN, male Cognitive Testing  Alert? Yes  Normal Appearance? Yes  Oriented to person? Yes  Place? Yes   Time? Yes  Recall of three objects?  Yes  Can perform simple calculations? Yes  Displays appropriate judgment? Yes  Can read the correct time from a watch/clock? Yes  HEENT: normocephalic, sclerae anicteric, TMs pearly, nares patent, no discharge or erythema, pharynx normal Oral cavity: MMM, no lesions Neck: supple, no lymphadenopathy, no thyromegaly, no masses Heart: RRR, normal S1, S2, no murmurs Lungs: gibbous type kyphosis , no wheezes, rhonchi, or rales Abdomen: +bs, soft, non tender, non distended, no masses, no  hepatomegaly, no splenomegaly Musculoskeletal: nontender, no swelling, no obvious deformity Extremities: no edema, no cyanosis, no clubbing Pulses: 2+ symmetric, upper and lower extremities, normal cap refill Neurological: alert, oriented x 3, CN2-12 intact, strength normal upper extremities and lower extremities, sensation normal throughout, DTRs 2+ throughout, no cerebellar signs, gait normal Psychiatric: normal affect, behavior normal, pleasant   Medicare Attestation I have personally reviewed: The patient's medical and social history Their use of alcohol, tobacco or illicit drugs Their current medications and supplements The patient's functional ability including ADLs,fall risks, home safety risks, cognitive, and hearing and visual impairment Diet and physical activities Evidence for depression or mood disorders  The patient's weight, height, BMI, and visual acuity have been recorded in the chart.  I have made referrals, counseling, and provided education to the patient based on review of the above and I have provided the patient with a written personalized care plan for preventive services.    Baneza Bartoszek DAVID, MD   07/27/2014

## 2014-07-26 NOTE — Patient Instructions (Signed)

## 2014-07-27 ENCOUNTER — Other Ambulatory Visit: Payer: Medicare Other

## 2014-07-27 DIAGNOSIS — I10 Essential (primary) hypertension: Secondary | ICD-10-CM | POA: Diagnosis not present

## 2014-07-27 DIAGNOSIS — Z79899 Other long term (current) drug therapy: Secondary | ICD-10-CM

## 2014-07-27 LAB — LIPID PANEL
CHOL/HDL RATIO: 2.5 ratio
Cholesterol: 135 mg/dL (ref 0–200)
HDL: 55 mg/dL (ref >39)
LDL Cholesterol: 65 mg/dL (ref 0–99)
Triglycerides: 76 mg/dL (ref <150)
VLDL: 15 mg/dL (ref 0–40)

## 2014-07-27 LAB — BASIC METABOLIC PANEL WITH GFR
BUN: 17 mg/dL (ref 6–23)
CALCIUM: 9.4 mg/dL (ref 8.4–10.5)
CO2: 29 mEq/L (ref 19–32)
CREATININE: 0.71 mg/dL (ref 0.50–1.35)
Chloride: 98 mEq/L (ref 96–112)
GFR, Est Non African American: 88 mL/min
Glucose, Bld: 122 mg/dL — ABNORMAL HIGH (ref 70–99)
POTASSIUM: 4.1 meq/L (ref 3.5–5.3)
SODIUM: 136 meq/L (ref 135–145)

## 2014-07-27 LAB — CBC WITH DIFFERENTIAL/PLATELET
BASOS ABS: 0.1 10*3/uL (ref 0.0–0.1)
BASOS PCT: 1 % (ref 0–1)
EOS ABS: 0.4 10*3/uL (ref 0.0–0.7)
EOS PCT: 5 % (ref 0–5)
HEMATOCRIT: 39.7 % (ref 39.0–52.0)
HEMOGLOBIN: 13.3 g/dL (ref 13.0–17.0)
LYMPHS PCT: 16 % (ref 12–46)
Lymphs Abs: 1.3 10*3/uL (ref 0.7–4.0)
MCH: 30.2 pg (ref 26.0–34.0)
MCHC: 33.5 g/dL (ref 30.0–36.0)
MCV: 90 fL (ref 78.0–100.0)
MPV: 10.6 fL (ref 8.6–12.4)
Monocytes Absolute: 0.8 10*3/uL (ref 0.1–1.0)
Monocytes Relative: 10 % (ref 3–12)
NEUTROS ABS: 5.4 10*3/uL (ref 1.7–7.7)
Neutrophils Relative %: 68 % (ref 43–77)
PLATELETS: 200 10*3/uL (ref 150–400)
RBC: 4.41 MIL/uL (ref 4.22–5.81)
RDW: 13 % (ref 11.5–15.5)
WBC: 7.9 10*3/uL (ref 4.0–10.5)

## 2014-07-27 LAB — HEPATIC FUNCTION PANEL
ALT: 15 U/L (ref 0–53)
AST: 22 U/L (ref 0–37)
Albumin: 4.1 g/dL (ref 3.5–5.2)
Alkaline Phosphatase: 88 U/L (ref 39–117)
BILIRUBIN DIRECT: 0.1 mg/dL (ref 0.0–0.3)
Indirect Bilirubin: 0.4 mg/dL (ref 0.2–1.2)
Total Bilirubin: 0.5 mg/dL (ref 0.2–1.2)
Total Protein: 6.4 g/dL (ref 6.0–8.3)

## 2014-07-27 LAB — PSA: PSA: 2.41 ng/mL (ref <=4.00)

## 2014-07-27 LAB — INSULIN, FASTING: INSULIN FASTING, SERUM: 34.5 u[IU]/mL — AB (ref 2.0–19.6)

## 2014-07-27 LAB — VITAMIN D 25 HYDROXY (VIT D DEFICIENCY, FRACTURES): Vit D, 25-Hydroxy: 48 ng/mL (ref 30–100)

## 2014-07-27 LAB — MAGNESIUM: Magnesium: 2 mg/dL (ref 1.5–2.5)

## 2014-07-27 LAB — HEMOGLOBIN A1C
Hgb A1c MFr Bld: 5.6 % (ref <5.7)
MEAN PLASMA GLUCOSE: 114 mg/dL (ref <117)

## 2014-07-27 LAB — TSH: TSH: 2.414 u[IU]/mL (ref 0.350–4.500)

## 2014-07-28 LAB — MICROALBUMIN / CREATININE URINE RATIO
Creatinine, Urine: 115.8 mg/dL
MICROALB/CREAT RATIO: 17.3 mg/g (ref 0.0–30.0)
Microalb, Ur: 2 mg/dL (ref <2.0)

## 2014-07-28 LAB — URINALYSIS, MICROSCOPIC ONLY
BACTERIA UA: NONE SEEN
CASTS: NONE SEEN
Squamous Epithelial / LPF: NONE SEEN

## 2014-08-01 ENCOUNTER — Ambulatory Visit: Payer: Medicare Other

## 2014-08-06 ENCOUNTER — Other Ambulatory Visit: Payer: Self-pay | Admitting: Internal Medicine

## 2014-08-06 DIAGNOSIS — K219 Gastro-esophageal reflux disease without esophagitis: Secondary | ICD-10-CM

## 2014-08-06 MED ORDER — OMEPRAZOLE 40 MG PO CPDR: 40.0000 mg | DELAYED_RELEASE_CAPSULE | Freq: Two times a day (BID) | ORAL | Status: DC

## 2014-08-08 ENCOUNTER — Other Ambulatory Visit: Payer: Self-pay | Admitting: *Deleted

## 2014-08-08 DIAGNOSIS — K219 Gastro-esophageal reflux disease without esophagitis: Secondary | ICD-10-CM

## 2014-08-08 MED ORDER — OMEPRAZOLE 40 MG PO CPDR: 40.0000 mg | DELAYED_RELEASE_CAPSULE | Freq: Two times a day (BID) | ORAL | Status: DC

## 2014-08-22 ENCOUNTER — Ambulatory Visit (INDEPENDENT_AMBULATORY_CARE_PROVIDER_SITE_OTHER): Payer: Medicare Other | Admitting: Podiatry

## 2014-08-22 DIAGNOSIS — M79673 Pain in unspecified foot: Secondary | ICD-10-CM

## 2014-08-22 DIAGNOSIS — B351 Tinea unguium: Secondary | ICD-10-CM

## 2014-08-22 NOTE — Progress Notes (Signed)
   Subjective:    Patient ID: Richard Rosales, male    DOB: 1930/10/01, 81 y.o.   MRN: [id number]  HPI  Pt presents for nail debridement  Review of Systems     Objective:   Physical Exam        Assessment & Plan:

## 2014-08-22 NOTE — Progress Notes (Signed)
Subjective:     Patient ID: Richard Rosales, male   DOB: 06-09-1931, 81 y.o.   MRN: [id number]  HPI patient presents for nail disease 1-5 both feet with thick yellow brittle nails that are painful when pressed   Review of Systems     Objective:   Physical Exam Neurovascular status intact with thick yellow brittle nailbeds 1-5 both feet that are painful    Assessment:     Mycotic nail infection with pain 1-5 both feet    Plan:     Debris painful nailbeds 1-5 both feet with no iatrogenic bleeding noted

## 2014-08-24 ENCOUNTER — Telehealth: Payer: Self-pay | Admitting: Emergency Medicine

## 2014-08-24 MED ORDER — BUDESONIDE-FORMOTEROL FUMARATE 160-4.5 MCG/ACT IN AERO: 2.0000 | INHALATION_SPRAY | Freq: Two times a day (BID) | RESPIRATORY_TRACT | Status: DC

## 2014-08-24 NOTE — Telephone Encounter (Signed)
Spoke with the pt  He is requesting a refill on his symbicort inhaler  I have sent this to [facility]  Nothing further needed per pt

## 2014-09-04 ENCOUNTER — Telehealth: Payer: Self-pay | Admitting: Gastroenterology

## 2014-09-05 ENCOUNTER — Telehealth: Payer: Self-pay | Admitting: Emergency Medicine

## 2014-09-05 MED ORDER — LEVOFLOXACIN 750 MG PO TABS: 750.0000 mg | ORAL_TABLET | Freq: Every day | ORAL | Status: DC

## 2014-09-05 MED ORDER — METHYLPREDNISOLONE 4 MG PO KIT: PACK | ORAL | Status: DC

## 2014-09-05 MED ORDER — PREDNISONE (PAK) 5 MG PO TABS: 5.0000 mg | ORAL_TABLET | ORAL | Status: DC

## 2014-09-05 NOTE — Telephone Encounter (Signed)
Called and spoke to pt. Informed pt of the recs per SN. Rx sent to preferred pharmacy. Appt made with RB on 09/08/2014. Pt verbalized understanding and denied any further questions or concerns at this time.

## 2014-09-05 NOTE — Telephone Encounter (Signed)
Called and spoke to pt. Pt c/op chest congestion, prod cough with yellow mucus and difficulty bring it up, hoarseness x 1 week. Pt denies change in SOB from baseline, CP/tightness or f/c/s. RB is unavailable today, will send to doc of day.   SN please advise.   Allergies  Allergen Reactions  . Ace Inhibitors Cough  . Beta Adrenergic Blockers Other (See Comments)    bradycardia  . Fluticasone-Salmeterol     Unable to remember per pt  . Hytrin [Terazosin]     Nasal congestion   . Prednisone     Shakes / tremors  . Vioxx [Rofecoxib] Other (See Comments)    dyspepsia    Current Outpatient Prescriptions on File Prior to Visit  Medication Sig Dispense Refill  . acetaminophen-codeine (TYLENOL #3) 300-30 MG per tablet Take 1 tablet by mouth every 4 (four) hours as needed for moderate pain.     Marland Kitchen albuterol (PROVENTIL) (2.5 MG/3ML) 0.083% nebulizer solution Take 3 mLs (2.5 mg total) by nebulization every 4 (four) hours. 300 mL 11  . aspirin 81 MG tablet Take 1 tablet 3 days per week    . azelastine (ASTELIN) 137 MCG/SPRAY nasal spray Place 1 spray into the nose at bedtime.     Marland Kitchen b complex vitamins tablet Take 1 tablet by mouth every morning.     . budesonide-formoterol (SYMBICORT) 160-4.5 MCG/ACT inhaler Inhale 2 puffs into the lungs 2 (two) times daily. 3 Inhaler 1  . chlorpheniramine (CHLOR-TRIMETON) 4 MG tablet Take 1 tablet in the morning and 2 tablets at bedtime    . Cholecalciferol (VITAMIN D) 2000 UNITS CAPS Take 1 capsule by mouth daily.     Marland Kitchen diltiazem (DILACOR XR) 240 MG 24 hr capsule Take 1 capsule (240 mg total) by mouth 2 (two) times daily. 180 capsule 1  . docusate sodium (COLACE) 100 MG capsule Take 100 mg by mouth 2 (two) times daily.      . fish oil-omega-3 fatty acids 1000 MG capsule Take 1 g by mouth 2 (two) times daily.      . furosemide (LASIX) 40 MG tablet Take 40 mg by mouth daily.     . Glucosamine HCl 1000 MG TABS Take 1 tablet by mouth daily.      Marland Kitchen guaifenesin  (HUMIBID E) 400 MG TABS Take 400 mg by mouth 2 (two) times daily.      Marland Kitchen HYDROcodone-acetaminophen (NORCO/VICODIN) 5-325 MG per tablet Take 1 tablet by mouth every 6 (six) hours as needed for moderate pain (Only takes during CT/MRI testing).     Marland Kitchen levothyroxine (SYNTHROID, LEVOTHROID) 100 MCG tablet Take 100 mcg by mouth daily before breakfast.    . losartan (COZAAR) 100 MG tablet Take 1 tablet (100 mg total) by mouth daily. 90 tablet 4  . minoxidil (LONITEN) 2.5 MG tablet Take 1 tablet (2.5 mg total) by mouth daily. For BP 90 tablet 99  . omeprazole (PRILOSEC) 40 MG capsule Take 1 capsule (40 mg total) by mouth 2 (two) times daily. 180 capsule 2  . PROVIGIL 200 MG tablet Take 1/2 tablet by mouth on Wed and Sun when needed (when pt drives)    . tiotropium (SPIRIVA) 18 MCG inhalation capsule Place 18 mcg into inhaler and inhale daily.     No current facility-administered medications on file prior to visit.

## 2014-09-05 NOTE — Telephone Encounter (Signed)
No answer and no answering machine

## 2014-09-05 NOTE — Telephone Encounter (Signed)
Per SN---  levaquin 750 mg  #7  1 daily Align once daily with the abx Medrol dosepak  To take as directed Increase the mucinex 400 mg to 2 po BID  Increase fluids po F/U with RB next week.  thanks

## 2014-09-06 ENCOUNTER — Other Ambulatory Visit: Payer: Self-pay | Admitting: *Deleted

## 2014-09-06 DIAGNOSIS — Z1212 Encounter for screening for malignant neoplasm of rectum: Secondary | ICD-10-CM

## 2014-09-06 LAB — POC HEMOCCULT BLD/STL (HOME/3-CARD/SCREEN)
Card #2 Fecal Occult Blod, POC: NEGATIVE
Card #3 Fecal Occult Blood, POC: NEGATIVE
FECAL OCCULT BLD: NEGATIVE

## 2014-09-07 ENCOUNTER — Ambulatory Visit: Payer: Medicare Other | Admitting: Emergency Medicine

## 2014-09-07 NOTE — Telephone Encounter (Signed)
Patient had attempted to be tested through [facility] last year. Called back to his home number. No answer. No voicemail.

## 2014-09-08 ENCOUNTER — Ambulatory Visit (INDEPENDENT_AMBULATORY_CARE_PROVIDER_SITE_OTHER): Payer: Medicare Other | Admitting: Emergency Medicine

## 2014-09-08 ENCOUNTER — Encounter: Payer: Self-pay | Admitting: Emergency Medicine

## 2014-09-08 VITALS — BP 132/70 | HR 73 | Ht 70.0 in | Wt 260.0 lb

## 2014-09-08 DIAGNOSIS — G4733 Obstructive sleep apnea (adult) (pediatric): Secondary | ICD-10-CM

## 2014-09-08 DIAGNOSIS — J449 Chronic obstructive pulmonary disease, unspecified: Secondary | ICD-10-CM

## 2014-09-08 DIAGNOSIS — J309 Allergic rhinitis, unspecified: Secondary | ICD-10-CM | POA: Diagnosis not present

## 2014-09-08 NOTE — Progress Notes (Signed)
Subjective:  Patient ID: Richard Rosales, male    DOB: 11/09/1930, 81 y.o.   MRN: [id number]  81 yo man, former smoker (quit'80)  For FU of COPD &  OSA on CPAP 12 cm. He has  hx of HTN, A Fib, chronic diastolic dysfxn and LE edema, GERD, diverticulosis, remote positive PPD.   ROV 07/08/13 -- OSA, COPD on Symbicort and Spiriva. Also A flutter, chronic diastolic CHF.  Was seen by [facility] in 12/14 for an AE-COPD. He is having fatigue, tiredness with heavier exertion. He is still having some cough, nasal drainage. He cut his omeprazole to once a day about 1.5 months ago. Just increased back to bid today.   ROV 09/06/13 -- Hx of OSA, COPD, A flutter, chronic diastolic CHF. He is on CPAP, uses Spiriva and Symbicort. He is SOB with exertion. He has not needed his SABA.  No flares. Able to wear CPAP every night. Has an am cough, usually clear.   ROV 11/24/13 -- Hx of OSA, COPD, A flutter, chronic diastolic CHF. He unfortunately had a fall recently, had to be admitted. A Ct chest was done and found several other findings that need to be evaluated - cardiomegaly, ? Hepatic enlargement / cirrhosis, thyroid nodules. He is having a hacking cough, wakes him up at night, can also happen during the day. He is doing well with his CPAP. He is on spiriva and symbicort. Takes omeprazole bid.   ROV 01/26/14 -- OSA, COPD, A flutter, chronic diastolic CHF. Has a chronic cough and rhinitis.  He returns feeling badly, lots of rhinitis and congestion. He remains on Spiriva and Symbicort. He is coughing up yellow mucous, bad taste.   ROV 03/14/14 -- hx OSA compliant with CPAP, COPD, A flutter, chronic diastolic CHF. Has a chronic cough and rhinitis.  He was treated beginning of August for an AE. He has been having more trouble with congestion over the last 2 weeks since he ran out of chlortrimeton. He is planning to get more.   ROV 05/25/14 -- follow up visit for COPD with chronic bronchitis, obstructive sleep apnea, atrial flutter and  diastolic CHF.  Over the last 2-3 weeks he has been very tired, sleeping in on occasion. Having exertional SOB. Having some congestion, a lot of cough and chest mucous. Wears his CPAP reliably.   ROV 06/27/14 -- hx of Severe COPD with associated chronic bronchitis that frequently exacerbates. He has OSA and is compliant with CPAP. Last time he had sx consistent with acute bronchitis, was treated with pred + clarithro. He has dyspnea with activity, chores that requires him to rest. He is still coughing, gets a hoarse voice, has to clear secretions every am. He had severe mood swings with prednisone.   ROV 09/07/14 -- follow-up visit for Severe COPD with associated chronic bronchitis that frequently exacerbates. He has OSA and is compliant with CPAP. He was treated with levaquin and medrol beginning 09/05/14 - he has had more cough and sputum for the last 10-14 days. Since starting the levaquin and medrol he is doing a bit better, still with DOE and wheeze   Filed Vitals:   09/08/14 1112  BP: 132/70  Pulse: 73  Height: 5\' 10"  (1.778 m)  Weight: 260 lb (117.935 kg)  SpO2: 95%   Exam:  GEN: A/Ox3; pleasant , NAD,  obese kyphotic.   HEENT:  [place]/AT,  NOSE-clear drainage , THROAT-clear, no lesions, no postnasal drip or exudate noted.   RESP B loud expiratory  wheezes. Coarse sounds with cough  CARD:  RRR, no m/r/g  , 1+ peripheral edema, pulses intact, no cyanosis or clubbing.  Musco: Warm bil, no deformities or joint swelling noted.   Neuro: alert, no focal deficits noted.    Skin: Warm, no lesions or rashes    11/18/13 --  COMPARISON: No prior CT chest. Multiple prior chest x-rays, most  recently 11/17/2013.  FINDINGS:  Contrast opacification of the pulmonary arteries is excellent. No  filling defects within either main pulmonary artery or their  branches in either lung to suggest pulmonary embolism. Heart  markedly enlarged with left ventricular predominance and evidence of  left  ventricular hypertrophy. Small pericardial effusion. Moderate 3  vessel coronary atherosclerosis. Severe atherosclerosis involving  the thoracic and upper abdominal aorta and their visualized branches  without evidence of aneurysm. Tortuous thoracic and upper abdominal  aorta.  Emphysematous changes throughout both lungs. Subsegmental airspace  consolidation with air bronchograms in the deep posterior right  lower lobe. Lungs otherwise clear. No pulmonary parenchymal nodules  or masses. No pleural effusions.  Thyroid gland enlargement with multiple nodules, the largest nodules  adjacent to one another in the right lobe measuring approximately  2.5 x 2.2 x 3.1 cm in the mid pole and 3.0 x 3.0 x 2.1 cm in the  lower pole. Scattered normal size lymph nodes in the mediastinum,  hila, and axilla without significant lymphadenopathy. Mild bilateral  gynecomastia.  Irregularity involving the contour of the visualized liver with  relative enlargement of the left lobe. Cysts involving the mid  portions of the visualized kidneys. Moderate pancreatic atrophy.  Hiatal hernia containing predominately intra-abdominal fat.  Diverticulosis involving the visualized distal transverse and  proximal descending colon without evidence of acute diverticulitis.  Bone window images demonstrated diffuse thoracic spondylosis and  exaggeration of the usual thoracic kyphosis.  Review of the MIP images confirms the above findings.   IMPRESSION:  1. No evidence of pulmonary embolism.  2. COPD/emphysema. Atelectasis involving the right lower lobe. No  acute cardiopulmonary disease otherwise.  3. Multinodular goiter, with adjacent 3 cm nodules in the right  lobe.  4. Hepatic cirrhosis.  5. Moderate pancreatic atrophy.  6. Diverticulosis involving the visualized distal transverse and  proximal descending colon.  7. Cardiomegaly with 3 vessel coronary artery disease. Left  ventricular hypertrophy. Small pericardial  effusion.  8. Mild bilateral gynecomastia   Assessment & Plan:   COPD (chronic obstructive pulmonary disease) He has an acute exacerbation and has been treated with Levaquin and Medrol. He is currently 3 days into the regimen. He is improving some but continues to be significant bronchospastic. I am strongly considering giving him an extra course of prednisone but he has such difficult side effects from this medication we have together decided to defer today. I've asked him to complete his current Levaquin and Medrol, start his nasal saline washes given the beginning of allergy season, and to follow with [place] in 2-3 weeks so that we can ensure his recovery   Obstructive sleep apnea Continue CPAP every night with oxygen bled in   Allergic rhinitis Continue current regimen but restart nasal saline washes every day

## 2014-09-08 NOTE — Patient Instructions (Signed)
Restart your nasal sinus flushes every day during the Spring months Please complete your Levaquin and Medrol as you have been taking it (take all to completion)  Continue your inhaled medications as you have been taking them Follow with Tammy Parrett or Dr Lamonte Sakai in 2 -3 weeks.

## 2014-09-08 NOTE — Assessment & Plan Note (Signed)
Continue CPAP every night with oxygen bled in 

## 2014-09-08 NOTE — Assessment & Plan Note (Signed)
He has an acute exacerbation and has been treated with Levaquin and Medrol. He is currently 3 days into the regimen. He is improving some but continues to be significant bronchospastic. I am strongly considering giving him an extra course of prednisone but he has such difficult side effects from this medication we have together decided to defer today. I've asked him to complete his current Levaquin and Medrol, start his nasal saline washes given the beginning of allergy season, and to follow with [place] in 2-3 weeks so that we can ensure his recovery

## 2014-09-08 NOTE — Assessment & Plan Note (Signed)
Continue current regimen but restart nasal saline washes every day

## 2014-09-28 ENCOUNTER — Encounter: Payer: Self-pay | Admitting: Adult Health

## 2014-09-28 ENCOUNTER — Ambulatory Visit (INDEPENDENT_AMBULATORY_CARE_PROVIDER_SITE_OTHER): Payer: Medicare Other | Admitting: Adult Health

## 2014-09-28 VITALS — BP 138/70 | HR 75 | Temp 97.7°F | Ht 68.0 in | Wt 259.0 lb

## 2014-09-28 DIAGNOSIS — J449 Chronic obstructive pulmonary disease, unspecified: Secondary | ICD-10-CM

## 2014-09-28 NOTE — Assessment & Plan Note (Signed)
Recent excerbation , now resolved   Plan  Continue on current regimen .  Follow up Dr. Lamonte Sakai  In 3 months and As needed

## 2014-09-28 NOTE — Patient Instructions (Addendum)
Continue on current regimen .  Follow up Dr. Lamonte Sakai  In 3 months and As needed

## 2014-09-28 NOTE — Progress Notes (Signed)
Subjective:  Patient ID: Richard Rosales, male    DOB: 1931/04/06, 81 y.o.   MRN: [id number]  81 yo man, former smoker (quit'80)  For FU of COPD &  OSA on CPAP 12 cm. He has  hx of HTN, A Fib, chronic diastolic dysfxn and LE edema, GERD, diverticulosis, remote positive PPD.   ROV 07/08/13 -- OSA, COPD on Symbicort and Spiriva. Also A flutter, chronic diastolic CHF.  Was seen by [facility] in 12/14 for an AE-COPD. He is having fatigue, tiredness with heavier exertion. He is still having some cough, nasal drainage. He cut his omeprazole to once a day about 1.5 months ago. Just increased back to bid today.   ROV 09/06/13 -- Hx of OSA, COPD, A flutter, chronic diastolic CHF. He is on CPAP, uses Spiriva and Symbicort. He is SOB with exertion. He has not needed his SABA.  No flares. Able to wear CPAP every night. Has an am cough, usually clear.   ROV 11/24/13 -- Hx of OSA, COPD, A flutter, chronic diastolic CHF. He unfortunately had a fall recently, had to be admitted. A Ct chest was done and found several other findings that need to be evaluated - cardiomegaly, ? Hepatic enlargement / cirrhosis, thyroid nodules. He is having a hacking cough, wakes him up at night, can also happen during the day. He is doing well with his CPAP. He is on spiriva and symbicort. Takes omeprazole bid.   ROV 01/26/14 -- OSA, COPD, A flutter, chronic diastolic CHF. Has a chronic cough and rhinitis.  He returns feeling badly, lots of rhinitis and congestion. He remains on Spiriva and Symbicort. He is coughing up yellow mucous, bad taste.   ROV 03/14/14 -- hx OSA compliant with CPAP, COPD, A flutter, chronic diastolic CHF. Has a chronic cough and rhinitis.  He was treated beginning of August for an AE. He has been having more trouble with congestion over the last 2 weeks since he ran out of chlortrimeton. He is planning to get more.   ROV 05/25/14 -- follow up visit for COPD with chronic bronchitis, obstructive sleep apnea, atrial flutter and  diastolic CHF.  Over the last 2-3 weeks he has been very tired, sleeping in on occasion. Having exertional SOB. Having some congestion, a lot of cough and chest mucous. Wears his CPAP reliably.   ROV 06/27/14 -- hx of Severe COPD with associated chronic bronchitis that frequently exacerbates. He has OSA and is compliant with CPAP. Last time he had sx consistent with acute bronchitis, was treated with pred + clarithro. He has dyspnea with activity, chores that requires him to rest. He is still coughing, gets a hoarse voice, has to clear secretions every am. He had severe mood swings with prednisone.   ROV 09/07/14 -- follow-up visit for Severe COPD with associated chronic bronchitis that frequently exacerbates. He has OSA and is compliant with CPAP. He was treated with levaquin and medrol beginning 09/05/14 - he has had more cough and sputum for the last 10-14 days. Since starting the levaquin and medrol he is doing a bit better, still with DOE and wheeze  09/28/2014 Follow up: Severe COPD /OSA /Chronic RF on O2  Patient returns for a one-month follow-up for COPD. He was seen last visit with a COPD exacerbation. He was treated with antibiotics and Medrol. Patient is feeling better , has some lingering congestion . No fever or discolored mucus .  He denies any hemoptysis, orthopnea, PND, leg swelling, or fever.. Using mucinex  As needed  .  Remains on Spiriva and Symbicort .     ROS;  Constitutional:   No  weight loss, night sweats,  Fevers, chills,  +fatigue, or  lassitude.  HEENT:   No headaches,  Difficulty swallowing,  Tooth/dental problems, or  Sore throat,                No sneezing, itching, ear ache, + nasal congestion, post nasal drip,   CV:  No chest pain,  Orthopnea, PND, swelling in lower extremities, anasarca, dizziness, palpitations, syncope.   GI  No heartburn, indigestion, abdominal pain, nausea, vomiting, diarrhea, change in bowel habits, loss of appetite, bloody stools.   Resp:    No chest wall deformity  Skin: no rash or lesions.  GU: no dysuria, change in color of urine, no urgency or frequency.  No flank pain, no hematuria   MS:  No joint pain or swelling.  No decreased range of motion.  No back pain.  Psych:  No change in mood or affect. No depression or anxiety.  No memory loss.        Filed Vitals:   09/28/14 1058  BP: 138/70  Pulse: 75  Temp: 97.7 F (36.5 C)  TempSrc: Oral  Height: 5\' 8"  (1.727 m)  Weight: 259 lb (117.482 kg)  SpO2: 98%   Exam:  GEN: A/Ox3; pleasant , NAD,  obese kyphotic.   HEENT:  [place]/AT,  NOSE-clear drainage , THROAT-clear, no lesions, no postnasal drip or exudate noted.   RESP : Few trace rhonchi , no wheezing   CARD:  RRR, no m/r/g  , 1+ peripheral edema/TEDS , pulses intact, no cyanosis or clubbing.  Musco: Warm bil, no deformities or joint swelling noted.   Neuro: alert, no focal deficits noted.    Skin: Warm, no lesions or rashes    11/18/13 --  COMPARISON: No prior CT chest. Multiple prior chest x-rays, most  recently 11/17/2013.  FINDINGS:  Contrast opacification of the pulmonary arteries is excellent. No  filling defects within either main pulmonary artery or their  branches in either lung to suggest pulmonary embolism. Heart  markedly enlarged with left ventricular predominance and evidence of  left ventricular hypertrophy. Small pericardial effusion. Moderate 3  vessel coronary atherosclerosis. Severe atherosclerosis involving  the thoracic and upper abdominal aorta and their visualized branches  without evidence of aneurysm. Tortuous thoracic and upper abdominal  aorta.  Emphysematous changes throughout both lungs. Subsegmental airspace  consolidation with air bronchograms in the deep posterior right  lower lobe. Lungs otherwise clear. No pulmonary parenchymal nodules  or masses. No pleural effusions.  Thyroid gland enlargement with multiple nodules, the largest nodules  adjacent to one another  in the right lobe measuring approximately  2.5 x 2.2 x 3.1 cm in the mid pole and 3.0 x 3.0 x 2.1 cm in the  lower pole. Scattered normal size lymph nodes in the mediastinum,  hila, and axilla without significant lymphadenopathy. Mild bilateral  gynecomastia.  Irregularity involving the contour of the visualized liver with  relative enlargement of the left lobe. Cysts involving the mid  portions of the visualized kidneys. Moderate pancreatic atrophy.  Hiatal hernia containing predominately intra-abdominal fat.  Diverticulosis involving the visualized distal transverse and  proximal descending colon without evidence of acute diverticulitis.  Bone window images demonstrated diffuse thoracic spondylosis and  exaggeration of the usual thoracic kyphosis.  Review of the MIP images confirms the above findings.   IMPRESSION:  1. No evidence  of pulmonary embolism.  2. COPD/emphysema. Atelectasis involving the right lower lobe. No  acute cardiopulmonary disease otherwise.  3. Multinodular goiter, with adjacent 3 cm nodules in the right  lobe.  4. Hepatic cirrhosis.  5. Moderate pancreatic atrophy.  6. Diverticulosis involving the visualized distal transverse and  proximal descending colon.  7. Cardiomegaly with 3 vessel coronary artery disease. Left  ventricular hypertrophy. Small pericardial effusion.  8. Mild bilateral gynecomastia   Assessment & Plan:   No problem-specific assessment & plan notes found for this encounter.

## 2014-10-19 ENCOUNTER — Encounter: Payer: Self-pay | Admitting: Internal Medicine

## 2014-10-19 ENCOUNTER — Ambulatory Visit (INDEPENDENT_AMBULATORY_CARE_PROVIDER_SITE_OTHER): Payer: Medicare Other | Admitting: Internal Medicine

## 2014-10-19 VITALS — BP 142/80 | HR 72 | Temp 97.7°F | Resp 16 | Ht 68.25 in | Wt 257.2 lb

## 2014-10-19 DIAGNOSIS — M542 Cervicalgia: Secondary | ICD-10-CM | POA: Diagnosis not present

## 2014-10-19 MED ORDER — DEXAMETHASONE 1 MG PO TABS: ORAL_TABLET | ORAL | Status: DC

## 2014-10-19 MED ORDER — TIZANIDINE HCL 4 MG PO TABS: ORAL_TABLET | ORAL | Status: DC

## 2014-10-19 NOTE — Progress Notes (Signed)
Subjective:    Patient ID: Richard Rosales, male    DOB: 1931-05-01, 81 y.o.   MRN: [id number]  HPI Patient presents with 3 week hx/o intermittent left neck pain radiating to the left shoulder and also relates long standing hx/o difficulty with his left shoulder with limited or decreased ROM and in ability to abduct the shoulder to the horizontal. Denies radicular sx's to the LUE.  Medication Sig  . acetaminophen-codeine (TYLENOL #3) 300-30 MG per tablet Take 1 tablet by mouth every 4 (four) hours as needed for moderate pain.   Marland Kitchen albuterol (PROVENTIL) (2.5 MG/3ML) 0.083% nebulizer solution Take 3 mLs (2.5 mg total) by nebulization every 4 (four) hours.  Marland Kitchen aspirin 81 MG tablet Take 1 tablet 3 days per week  . azelastine (ASTELIN) 137 MCG/SPRAY nasal spray Place 2 sprays into the nose at bedtime.   Marland Kitchen b complex vitamins tablet Take 1 tablet by mouth every morning.   . budesonide-formoterol (SYMBICORT) 160-4.5 MCG/ACT inhaler Inhale 2 puffs into the lungs 2 (two) times daily.  . chlorpheniramine (CHLOR-TRIMETON) 4 MG tablet Take 1 tablet in the morning and 2 tablets at bedtime  . Cholecalciferol (VITAMIN D) 2000 UNITS CAPS Take 1 capsule by mouth daily.   Marland Kitchen diltiazem (DILACOR XR) 240 MG 24 hr capsule Take 1 capsule (240 mg total) by mouth 2 (two) times daily.  Marland Kitchen docusate sodium (COLACE) 100 MG capsule Take 100 mg by mouth 2 (two) times daily.    . fish oil-omega-3 fatty acids 1000 MG capsule Take 1 g by mouth 2 (two) times daily.    . furosemide (LASIX) 40 MG tablet Take 40 mg by mouth daily.   . Glucosamine HCl 1000 MG TABS Take 1 tablet by mouth daily.    Marland Kitchen guaifenesin (HUMIBID E) 400 MG TABS Take 400 mg by mouth 2 (two) times daily.    Marland Kitchen HYDROcodone-acetaminophen (NORCO/VICODIN) 5-325 MG per tablet Take 1 tablet by mouth every 6 (six) hours as needed for moderate pain (Only takes during CT/MRI testing).   Marland Kitchen levothyroxine (SYNTHROID, LEVOTHROID) 100 MCG tablet Take 100 mcg by mouth daily before  breakfast.  . losartan (COZAAR) 100 MG tablet Take 1 tablet (100 mg total) by mouth daily.  . minoxidil (LONITEN) 2.5 MG tablet Take 1 tablet (2.5 mg total) by mouth daily. For BP  . omeprazole (PRILOSEC) 40 MG capsule Take 1 capsule (40 mg total) by mouth 2 (two) times daily.  Marland Kitchen PROVIGIL 200 MG tablet Take 1/2 tablet by mouth on Wed and Sun when needed (when pt drives)  . tiotropium (SPIRIVA) 18 MCG inhalation capsule Place 18 mcg into inhaler and inhale daily.   Allergies  Allergen Reactions  . Ace Inhibitors Cough  . Beta Adrenergic Blockers Other (See Comments)    bradycardia  . Fluticasone-Salmeterol     Unable to remember per pt  . Hytrin [Terazosin]     Nasal congestion   . Prednisone     Shakes / tremors  . Vioxx [Rofecoxib] Other (See Comments)    dyspepsia   Past Medical History  Diagnosis Date  . HTN (hypertension)   . Diastolic dysfunction   . Atrial fibrillation   . GERD (gastroesophageal reflux disease)   . Allergic rhinitis   . Diverticulosis of colon     recurrent GI bleeding  . OSA (obstructive sleep apnea)     compliants w/ CPAP  . Hyperplastic colon polyp 2003  . Chronic gastritis   . Positive PPD  remote  . Atrial flutter   . Acute bronchitis   . Anemia     iron deficient  . Shortness of breath   . Venous insufficiency   . Moderate aortic insufficiency   . DJD (degenerative joint disease)   . COPD (chronic obstructive pulmonary disease)   . Hypothyroidism   . Adenomatous colon polyp    Review of Systems 10 point systems review negative except as above.    Objective:   Physical Exam   BP 142/80 mmHg  Pulse 72  Temp(Src) 97.7 F (36.5 C)  Resp 16  Ht 5' 8.25" (1.734 m)  Wt 257 lb 3.2 oz (116.665 kg)  BMI 38.80 kg/m2  HEENT - Eac's patent. TM's Nl. EOM's full. PERRLA. NasoOroPharynx clear. Neck - supple. (+) tender Left para cervical muscles. Nl Thyroid. Carotids 2+ & No bruits, nodes, JVD Chest - Clear equal BS w/o Rales, rhonchi,  wheezes. Cor - Nl HS. RRR w/o sig MGR. PP 1(+). No edema. Abd - No palpable organomegaly, masses or tenderness. BS nl. MS- Decreased ROM of LUE due to pain. Neuro - No obvious Cr N abnormalities.  Neuro - Sensory, motor and Cerebellar functions appear Nl w/o focal abnormalities. DTR's flat in both UE's.  Psyche - Mental status normal & appropriate.  No delusions, ideations or obvious mood abnormalities.     Assessment & Plan:   1. Neck pain on left side  - tiZANidine (ZANAFLEX) 4 MG tablet; Take 1/2 to 1 tablet 3 x day as needed for muscle spasm  Dispense: 90 tablet; Refill: 0 - dexamethasone (DECADRON) 1 MG tablet; Take 3 tablets x 5 days , then 2 tablets x 5 days , then 1 tablet x 5 days  Dispense: 30 tablet; Refill: 0  - advised is not significantly improve in 1 week , will consider Cx MRI.

## 2014-10-30 ENCOUNTER — Other Ambulatory Visit: Payer: Self-pay

## 2014-10-30 MED ORDER — FUROSEMIDE 40 MG PO TABS: 40.0000 mg | ORAL_TABLET | Freq: Every day | ORAL | Status: DC

## 2014-11-03 ENCOUNTER — Telehealth: Payer: Self-pay | Admitting: Emergency Medicine

## 2014-11-03 MED ORDER — BUDESONIDE-FORMOTEROL FUMARATE 160-4.5 MCG/ACT IN AERO: 2.0000 | INHALATION_SPRAY | Freq: Two times a day (BID) | RESPIRATORY_TRACT | Status: DC

## 2014-11-03 MED ORDER — TIOTROPIUM BROMIDE MONOHYDRATE 18 MCG IN CAPS: 18.0000 ug | ORAL_CAPSULE | Freq: Every day | RESPIRATORY_TRACT | Status: DC

## 2014-11-03 NOTE — Telephone Encounter (Signed)
Spoke with pt- requests that 90-day supply of Spiriva and Symbicort be sent to [facility].  This has been done, pt aware. Nothing further needed.

## 2014-11-03 NOTE — Telephone Encounter (Signed)
lmtcb x1 

## 2014-11-14 ENCOUNTER — Ambulatory Visit (INDEPENDENT_AMBULATORY_CARE_PROVIDER_SITE_OTHER): Payer: Medicare Other | Admitting: Physician Assistant

## 2014-11-14 ENCOUNTER — Encounter: Payer: Self-pay | Admitting: Physician Assistant

## 2014-11-14 VITALS — BP 140/72 | HR 68 | Temp 97.8°F | Resp 16 | Ht 68.25 in | Wt 257.0 lb

## 2014-11-14 DIAGNOSIS — R7303 Prediabetes: Secondary | ICD-10-CM

## 2014-11-14 DIAGNOSIS — G4733 Obstructive sleep apnea (adult) (pediatric): Secondary | ICD-10-CM

## 2014-11-14 DIAGNOSIS — I10 Essential (primary) hypertension: Secondary | ICD-10-CM | POA: Diagnosis not present

## 2014-11-14 DIAGNOSIS — E782 Mixed hyperlipidemia: Secondary | ICD-10-CM | POA: Diagnosis not present

## 2014-11-14 DIAGNOSIS — I482 Chronic atrial fibrillation, unspecified: Secondary | ICD-10-CM

## 2014-11-14 DIAGNOSIS — E559 Vitamin D deficiency, unspecified: Secondary | ICD-10-CM

## 2014-11-14 DIAGNOSIS — L97521 Non-pressure chronic ulcer of other part of left foot limited to breakdown of skin: Secondary | ICD-10-CM

## 2014-11-14 DIAGNOSIS — J449 Chronic obstructive pulmonary disease, unspecified: Secondary | ICD-10-CM

## 2014-11-14 DIAGNOSIS — R7309 Other abnormal glucose: Secondary | ICD-10-CM | POA: Diagnosis not present

## 2014-11-14 DIAGNOSIS — E039 Hypothyroidism, unspecified: Secondary | ICD-10-CM | POA: Diagnosis not present

## 2014-11-14 DIAGNOSIS — Z79899 Other long term (current) drug therapy: Secondary | ICD-10-CM

## 2014-11-14 LAB — CBC WITH DIFFERENTIAL/PLATELET
BASOS ABS: 0.1 10*3/uL (ref 0.0–0.1)
BASOS PCT: 1 % (ref 0–1)
EOS ABS: 0.4 10*3/uL (ref 0.0–0.7)
Eosinophils Relative: 6 % — ABNORMAL HIGH (ref 0–5)
HCT: 41.5 % (ref 39.0–52.0)
HEMOGLOBIN: 13.9 g/dL (ref 13.0–17.0)
LYMPHS PCT: 20 % (ref 12–46)
Lymphs Abs: 1.2 10*3/uL (ref 0.7–4.0)
MCH: 29.8 pg (ref 26.0–34.0)
MCHC: 33.5 g/dL (ref 30.0–36.0)
MCV: 89.1 fL (ref 78.0–100.0)
MPV: 10.7 fL (ref 8.6–12.4)
Monocytes Absolute: 0.5 10*3/uL (ref 0.1–1.0)
Monocytes Relative: 8 % (ref 3–12)
NEUTROS PCT: 65 % (ref 43–77)
Neutro Abs: 3.9 10*3/uL (ref 1.7–7.7)
Platelets: 176 10*3/uL (ref 150–400)
RBC: 4.66 MIL/uL (ref 4.22–5.81)
RDW: 13.3 % (ref 11.5–15.5)
WBC: 6 10*3/uL (ref 4.0–10.5)

## 2014-11-14 NOTE — Progress Notes (Signed)
Assessment and Plan:  1. Hypertension -Continue medication, monitor blood pressure at home. Continue DASH diet.  Reminder to go to the ER if any CP, SOB, nausea, dizziness, severe HA, changes vision/speech, left arm numbness and tingling and jaw pain.  2. Cholesterol -Continue diet and exercise. Check cholesterol.   3. Prediabetes  -Continue diet and exercise. Check A1C  4. Vitamin D Def - check level and continue medications.   5. Fatigue/SOB He is very frustrated with his SOB, will have good and bad days.  Discussed multifactorial with diastolic, Afib, COPD, OSA and likely PHTN.  Discussed switching losartan to Diovan, declines at this time He is compliant with his CPAP however it was last titrated over 10 years ago, will send a note to Dr. Lamonte Sakai for his opinion on any benefit of repeat sleep study/titration.  Continue medications, check labs, wear oxygen while walking/exercising  Continue weight loss  Continue diet and meds as discussed. Further disposition pending results of labs. Over 30 minutes of exam, counseling, chart review, and critical decision making was performed  HPI 81 y.o. male  presents for 3 month follow up on hypertension, cholesterol, prediabetes, and vitamin D deficiency.   His blood pressure has been controlled at home, today their BP is BP: 140/72 mmHg  He does not workout. He denies chest pain, shortness of breath, dizziness. Patient has chronic Afib, controlled with diltiazem, not on anticoagulation due to fall risk.  He is very frustrated with his breathing and fatigue, he has severe COPD, diastolic dysfunction with OSA, has not had titrated for a long time.  Weight is down, denies PND, orthopnea.   He is on cholesterol medication and denies myalgias. His cholesterol is at goal. The cholesterol last visit was:   Lab Results  Component Value Date   CHOL 135 07/26/2014   HDL 55 07/26/2014   LDLCALC 65 07/26/2014   TRIG 76 07/26/2014   CHOLHDL 2.5  07/26/2014    He has been working on diet and exercise for prediabetes, and denies paresthesia of the feet, polydipsia, polyuria and visual disturbances. Last A1C in the office was:  Lab Results  Component Value Date   HGBA1C 5.6 07/26/2014   Patient is on Vitamin D supplement.   Lab Results  Component Value Date   VD25OH 48 07/26/2014     He is on thyroid medication. His medication was not changed last visit.   Lab Results  Component Value Date   TSH 2.414 07/26/2014  .  BMI is Body mass index is 38.77 kg/(m^2)., he is working on diet and exercise. Wt Readings from Last 3 Encounters:  11/14/14 257 lb (116.574 kg)  10/19/14 257 lb 3.2 oz (116.665 kg)  09/28/14 259 lb (117.482 kg)    Current Medications:  Current Outpatient Prescriptions on File Prior to Visit  Medication Sig Dispense Refill  . acetaminophen-codeine (TYLENOL #3) 300-30 MG per tablet Take 1 tablet by mouth every 4 (four) hours as needed for moderate pain.     Marland Kitchen albuterol (PROVENTIL) (2.5 MG/3ML) 0.083% nebulizer solution Take 3 mLs (2.5 mg total) by nebulization every 4 (four) hours. 300 mL 11  . aspirin 81 MG tablet Take 1 tablet 3 days per week    . azelastine (ASTELIN) 137 MCG/SPRAY nasal spray Place 2 sprays into the nose at bedtime.     Marland Kitchen b complex vitamins tablet Take 1 tablet by mouth every morning.     . budesonide-formoterol (SYMBICORT) 160-4.5 MCG/ACT inhaler Inhale 2 puffs into the  lungs 2 (two) times daily. 3 Inhaler 2  . chlorpheniramine (CHLOR-TRIMETON) 4 MG tablet Take 1 tablet in the morning and 2 tablets at bedtime    . Cholecalciferol (VITAMIN D) 2000 UNITS CAPS Take 1 capsule by mouth daily.     Marland Kitchen diltiazem (DILACOR XR) 240 MG 24 hr capsule Take 1 capsule (240 mg total) by mouth 2 (two) times daily. 180 capsule 1  . docusate sodium (COLACE) 100 MG capsule Take 100 mg by mouth 2 (two) times daily.      . fish oil-omega-3 fatty acids 1000 MG capsule Take 1 g by mouth 2 (two) times daily.      .  furosemide (LASIX) 40 MG tablet Take 1 tablet (40 mg total) by mouth daily. 90 tablet 3  . Glucosamine HCl 1000 MG TABS Take 1 tablet by mouth daily.      Marland Kitchen guaifenesin (HUMIBID E) 400 MG TABS Take 400 mg by mouth 2 (two) times daily.      Marland Kitchen HYDROcodone-acetaminophen (NORCO/VICODIN) 5-325 MG per tablet Take 1 tablet by mouth every 6 (six) hours as needed for moderate pain (Only takes during CT/MRI testing).     Marland Kitchen levothyroxine (SYNTHROID, LEVOTHROID) 100 MCG tablet Take 100 mcg by mouth daily before breakfast.    . losartan (COZAAR) 100 MG tablet Take 1 tablet (100 mg total) by mouth daily. 90 tablet 4  . minoxidil (LONITEN) 2.5 MG tablet Take 1 tablet (2.5 mg total) by mouth daily. For BP 90 tablet 99  . omeprazole (PRILOSEC) 40 MG capsule Take 1 capsule (40 mg total) by mouth 2 (two) times daily. 180 capsule 2  . PROVIGIL 200 MG tablet Take 1/2 tablet by mouth on Wed and Sun when needed (when pt drives)    . tiotropium (SPIRIVA) 18 MCG inhalation capsule Place 1 capsule (18 mcg total) into inhaler and inhale daily. 90 capsule 2   No current facility-administered medications on file prior to visit.   Medical History:  Past Medical History  Diagnosis Date  . HTN (hypertension)   . Diastolic dysfunction   . Atrial fibrillation   . GERD (gastroesophageal reflux disease)   . Allergic rhinitis   . Diverticulosis of colon     recurrent GI bleeding  . OSA (obstructive sleep apnea)     compliants w/ CPAP  . Hyperplastic colon polyp 2003  . Chronic gastritis   . Positive PPD     remote  . Atrial flutter   . Acute bronchitis   . Anemia     iron deficient  . Shortness of breath   . Venous insufficiency   . Moderate aortic insufficiency   . DJD (degenerative joint disease)   . COPD (chronic obstructive pulmonary disease)   . Hypothyroidism   . Adenomatous colon polyp    Allergies:  Allergies  Allergen Reactions  . Ace Inhibitors Cough  . Beta Adrenergic Blockers Other (See Comments)     bradycardia  . Fluticasone-Salmeterol     Unable to remember per pt  . Hytrin [Terazosin]     Nasal congestion   . Prednisone     Shakes / tremors  . Vioxx [Rofecoxib] Other (See Comments)    dyspepsia    Review of Systems:  Review of Systems  Constitutional: Positive for malaise/fatigue. Negative for fever, chills, weight loss and diaphoresis.  Eyes: Negative.   Respiratory: Positive for shortness of breath. Negative for cough, hemoptysis, sputum production and wheezing.   Cardiovascular: Positive for leg swelling. Negative  for chest pain, palpitations, orthopnea, claudication and PND.  Gastrointestinal: Negative.   Genitourinary: Negative.   Musculoskeletal: Positive for neck pain. Negative for myalgias, back pain, joint pain and falls.  Skin: Negative.   Neurological: Negative.  Negative for weakness.  Psychiatric/Behavioral: Negative.     Family history- Review and unchanged Social history- Review and unchanged Physical Exam: BP 140/72 mmHg  Pulse 68  Temp(Src) 97.8 F (36.6 C)  Resp 16  Ht 5' 8.25" (1.734 m)  Wt 257 lb (116.574 kg)  BMI 38.77 kg/m2 Wt Readings from Last 3 Encounters:  11/14/14 257 lb (116.574 kg)  10/19/14 257 lb 3.2 oz (116.665 kg)  09/28/14 259 lb (117.482 kg)   General Appearance: Well nourished, in no apparent distress. Eyes: PERRLA, EOMs, conjunctiva no swelling or erythema Sinuses: No Frontal/maxillary tenderness ENT/Mouth: Ext aud canals clear, TMs without erythema, bulging. No erythema, swelling, or exudate on post pharynx. Tonsils not swollen or erythematous. Hearing decrease Neck: Supple, thyroid normal.  Respiratory: Respiratory effort normal, right rhochi, worse than left rhonci without rales, wheezing or stridor.02 96% RA Cardio: Irreg Irreg with holosystolic murmur. Brisk peripheral pulses with 2-3+ edema with compression stockings.  Abdomen: Soft, + BS, obese Non tender, no guarding, rebound, hernias, masses. Lymphatics:  Non tender without lymphadenopathy.  Musculoskeletal: Full ROM, 5/5 strength, normal unsteady/antalgic  Skin: Warm, dry without rashes, lesions, ecchymosis. Left 1st toe with tender nodule/ulcer, seeing podiatry next week Neuro: Cranial nerves intact. Normal muscle tone, no cerebellar symptoms.  Psych: Awake and oriented X 3, normal affect, Insight and Judgment appropriate.    Vicie Mutters, PA-C 3:28 PM [facility]

## 2014-11-14 NOTE — Patient Instructions (Signed)
I think it is possible that you have sleep apnea. It can cause interrupted sleep, headaches, frequent awakenings, fatigue, dry mouth, fast/slow heart beats, memory issues, anxiety/depression, swelling, numbness tingling hands/feet, weight gain, shortness of breath, and the list goes on. Sleep apnea needs to be ruled out because if it is left untreated it does eventually lead to abnormal heart beats, lung failure or heart failure as well as increasing the risk of heart attack and stroke. There are masks you can wear OR a mouth piece that I can give you information about. Often times though people feel MUCH better after getting treatment.   Sleep Apnea  Sleep apnea is a sleep disorder characterized by abnormal pauses in breathing while you sleep. When your breathing pauses, the level of oxygen in your blood decreases. This causes you to move out of deep sleep and into light sleep. As a result, your quality of sleep is poor, and the system that carries your blood throughout your body (cardiovascular system) experiences stress. If sleep apnea remains untreated, the following conditions can develop:  High blood pressure (hypertension).  Coronary artery disease.  Inability to achieve or maintain an erection (impotence).  Impairment of your thought process (cognitive dysfunction). There are three types of sleep apnea: 1. Obstructive sleep apnea--Pauses in breathing during sleep because of a blocked airway. 2. Central sleep apnea--Pauses in breathing during sleep because the area of the brain that controls your breathing does not send the correct signals to the muscles that control breathing. 3. Mixed sleep apnea--A combination of both obstructive and central sleep apnea.  RISK FACTORS The following risk factors can increase your risk of developing sleep apnea:  Being overweight.  Smoking.  Having narrow passages in your nose and throat.  Being of older age.  Being male.  Alcohol use.   Sedative and tranquilizer use.  Ethnicity. Among individuals younger than 35 years, African Americans are at increased risk of sleep apnea. SYMPTOMS   Difficulty staying asleep.  Daytime sleepiness and fatigue.  Loss of energy.  Irritability.  Loud, heavy snoring.  Morning headaches.  Trouble concentrating.  Forgetfulness.  Decreased interest in sex. DIAGNOSIS  In order to diagnose sleep apnea, your caregiver will perform a physical examination. Your caregiver may suggest that you take a home sleep test. Your caregiver may also recommend that you spend the night in a sleep lab. In the sleep lab, several monitors record information about your heart, lungs, and brain while you sleep. Your leg and arm movements and blood oxygen level are also recorded. TREATMENT The following actions may help to resolve mild sleep apnea:  Sleeping on your side.   Using a decongestant if you have nasal congestion.   Avoiding the use of depressants, including alcohol, sedatives, and narcotics.   Losing weight and modifying your diet if you are overweight. There also are devices and treatments to help open your airway:  Oral appliances. These are custom-made mouthpieces that shift your lower jaw forward and slightly open your bite. This opens your airway.  Devices that create positive airway pressure. This positive pressure "splints" your airway open to help you breathe better during sleep. The following devices create positive airway pressure:  Continuous positive airway pressure (CPAP) device. The CPAP device creates a continuous level of air pressure with an air pump. The air is delivered to your airway through a mask while you sleep. This continuous pressure keeps your airway open.  Nasal expiratory positive airway pressure (EPAP) device. The EPAP device  creates positive air pressure as you exhale. The device consists of single-use valves, which are inserted into each nostril and held in  place by adhesive. The valves create very little resistance when you inhale but create much more resistance when you exhale. That increased resistance creates the positive airway pressure. This positive pressure while you exhale keeps your airway open, making it easier to breath when you inhale again.  Bilevel positive airway pressure (BPAP) device. The BPAP device is used mainly in patients with central sleep apnea. This device is similar to the CPAP device because it also uses an air pump to deliver continuous air pressure through a mask. However, with the BPAP machine, the pressure is set at two different levels. The pressure when you exhale is lower than the pressure when you inhale.  Surgery. Typically, surgery is only done if you cannot comply with less invasive treatments or if the less invasive treatments do not improve your condition. Surgery involves removing excess tissue in your airway to create a wider passage way. Document Released: 05/30/2002 Document Revised: 10/04/2012 Document Reviewed: 10/16/2011 [facility] Patient Information 2015 [place], [place]. This information is not intended to replace advice given to you by your health care provider. Make sure you discuss any questions you have with your health care provider.     Chronic Obstructive Pulmonary Disease Chronic obstructive pulmonary disease (COPD) is a common lung condition in which airflow from the lungs is limited. COPD is a general term that can be used to describe many different lung problems that limit airflow, including both chronic bronchitis and emphysema. If you have COPD, your lung function will probably never return to normal, but there are measures you can take to improve lung function and make yourself feel better.  CAUSES   Smoking (common).   Exposure to secondhand smoke.   Genetic problems.  Chronic inflammatory lung diseases or recurrent infections. SYMPTOMS   Shortness of breath, especially with  physical activity.   Deep, persistent (chronic) cough with a large amount of thick mucus.   Wheezing.   Rapid breaths (tachypnea).   Gray or bluish discoloration (cyanosis) of the skin, especially in fingers, toes, or lips.   Fatigue.   Weight loss.   Frequent infections or episodes when breathing symptoms become much worse (exacerbations).   Chest tightness. DIAGNOSIS  Your health care provider will take a medical history and perform a physical examination to make the initial diagnosis. Additional tests for COPD may include:   Lung (pulmonary) function tests.  Chest X-ray.  CT scan.  Blood tests. TREATMENT  Treatment available to help you feel better when you have COPD includes:   Inhaler and nebulizer medicines. These help manage the symptoms of COPD and make your breathing more comfortable.  Supplemental oxygen. Supplemental oxygen is only helpful if you have a low oxygen level in your blood.   Exercise and physical activity. These are beneficial for nearly all people with COPD. Some people may also benefit from a pulmonary rehabilitation program. HOME CARE INSTRUCTIONS   Take all medicines (inhaled or pills) as directed by your health care provider.  Avoid over-the-counter medicines or cough syrups that dry up your airway (such as antihistamines) and slow down the elimination of secretions unless instructed otherwise by your health care provider.   If you are a smoker, the most important thing that you can do is stop smoking. Continuing to smoke will cause further lung damage and breathing trouble. Ask your health care provider for help with  quitting smoking. He or she can direct you to community resources or hospitals that provide support.  Avoid exposure to irritants such as smoke, chemicals, and fumes that aggravate your breathing.  Use oxygen therapy and pulmonary rehabilitation if directed by your health care provider. If you require home oxygen  therapy, ask your health care provider whether you should purchase a pulse oximeter to measure your oxygen level at home.   Avoid contact with individuals who have a contagious illness.  Avoid extreme temperature and humidity changes.  Eat healthy foods. Eating smaller, more frequent meals and resting before meals may help you maintain your strength.  Stay active, but balance activity with periods of rest. Exercise and physical activity will help you maintain your ability to do things you want to do.  Preventing infection and hospitalization is very important when you have COPD. Make sure to receive all the vaccines your health care provider recommends, especially the pneumococcal and influenza vaccines. Ask your health care provider whether you need a pneumonia vaccine.  Learn and use relaxation techniques to manage stress.  Learn and use controlled breathing techniques as directed by your health care provider. Controlled breathing techniques include:   Pursed lip breathing. Start by breathing in (inhaling) through your nose for 1 second. Then, purse your lips as if you were going to whistle and breathe out (exhale) through the pursed lips for 2 seconds.   Diaphragmatic breathing. Start by putting one hand on your abdomen just above your waist. Inhale slowly through your nose. The hand on your abdomen should move out. Then purse your lips and exhale slowly. You should be able to feel the hand on your abdomen moving in as you exhale.   Learn and use controlled coughing to clear mucus from your lungs. Controlled coughing is a series of short, progressive coughs. The steps of controlled coughing are:  1. Lean your head slightly forward.  2. Breathe in deeply using diaphragmatic breathing.  3. Try to hold your breath for 3 seconds.  4. Keep your mouth slightly open while coughing twice.  5. Spit any mucus out into a tissue.  6. Rest and repeat the steps once or twice as needed. SEEK  MEDICAL CARE IF:   You are coughing up more mucus than usual.   There is a change in the color or thickness of your mucus.   Your breathing is more labored than usual.   Your breathing is faster than usual.  SEEK IMMEDIATE MEDICAL CARE IF:   You have shortness of breath while you are resting.   You have shortness of breath that prevents you from:  Being able to talk.   Performing your usual physical activities.   You have chest pain lasting longer than 5 minutes.   Your skin color is more cyanotic than usual.  You measure low oxygen saturations for longer than 5 minutes with a pulse oximeter. MAKE SURE YOU:   Understand these instructions.  Will watch your condition.  Will get help right away if you are not doing well or get worse. Document Released: 03/19/2005 Document Revised: 10/24/2013 Document Reviewed: 02/03/2013 [facility] Patient Information 2015 [place], [place]. This information is not intended to replace advice given to you by your health care provider. Make sure you discuss any questions you have with your health care provider.

## 2014-11-15 LAB — BASIC METABOLIC PANEL WITH GFR
BUN: 11 mg/dL (ref 6–23)
CALCIUM: 9.1 mg/dL (ref 8.4–10.5)
CHLORIDE: 99 meq/L (ref 96–112)
CO2: 28 mEq/L (ref 19–32)
CREATININE: 0.6 mg/dL (ref 0.50–1.35)
GFR, Est African American: >89 mL/min
Glucose, Bld: 99 mg/dL (ref 70–99)
Potassium: 4.5 mEq/L (ref 3.5–5.3)
Sodium: 136 mEq/L (ref 135–145)

## 2014-11-15 LAB — HEMOGLOBIN A1C
Hgb A1c MFr Bld: 5.9 % — ABNORMAL HIGH (ref <5.7)
Mean Plasma Glucose: 123 mg/dL — ABNORMAL HIGH (ref <117)

## 2014-11-15 LAB — HEPATIC FUNCTION PANEL
ALK PHOS: 92 U/L (ref 39–117)
ALT: 16 U/L (ref 0–53)
AST: 24 U/L (ref 0–37)
Albumin: 3.8 g/dL (ref 3.5–5.2)
BILIRUBIN INDIRECT: 0.4 mg/dL (ref 0.2–1.2)
BILIRUBIN TOTAL: 0.6 mg/dL (ref 0.2–1.2)
Bilirubin, Direct: 0.2 mg/dL (ref 0.0–0.3)
Total Protein: 6.3 g/dL (ref 6.0–8.3)

## 2014-11-15 LAB — LIPID PANEL
Cholesterol: 137 mg/dL (ref 0–200)
HDL: 50 mg/dL (ref >=40)
LDL Cholesterol: 63 mg/dL (ref 0–99)
TRIGLYCERIDES: 121 mg/dL (ref <150)
Total CHOL/HDL Ratio: 2.7 Ratio
VLDL: 24 mg/dL (ref 0–40)

## 2014-11-15 LAB — INSULIN, FASTING: Insulin fasting, serum: 13.7 u[IU]/mL (ref 2.0–19.6)

## 2014-11-15 LAB — TSH: TSH: 3.524 u[IU]/mL (ref 0.350–4.500)

## 2014-11-15 LAB — MAGNESIUM: MAGNESIUM: 1.9 mg/dL (ref 1.5–2.5)

## 2014-11-28 ENCOUNTER — Ambulatory Visit: Payer: Medicare Other | Admitting: Podiatry

## 2014-12-05 ENCOUNTER — Encounter: Payer: Self-pay | Admitting: Podiatry

## 2014-12-05 ENCOUNTER — Ambulatory Visit (INDEPENDENT_AMBULATORY_CARE_PROVIDER_SITE_OTHER): Payer: Medicare Other | Admitting: Podiatry

## 2014-12-05 VITALS — BP 148/87 | HR 81 | Resp 18

## 2014-12-05 DIAGNOSIS — M79673 Pain in unspecified foot: Secondary | ICD-10-CM | POA: Diagnosis not present

## 2014-12-05 DIAGNOSIS — B351 Tinea unguium: Secondary | ICD-10-CM | POA: Diagnosis not present

## 2014-12-05 DIAGNOSIS — Q828 Other specified congenital malformations of skin: Secondary | ICD-10-CM | POA: Diagnosis not present

## 2014-12-05 NOTE — Progress Notes (Signed)
Patient ID: JOHNCHARLES FUSSELMAN, male   DOB: 05/26/1931, 81 y.o.   MRN: [id number] Complaint:  Visit Type: Patient returns to my office for continued preventative foot care services. Complaint: Patient states" my nails have grown long and thick and become painful to walk and wear shoes".  He also has painful callus on the tip of his big toe right foot. Patient has been diagnosed with DM with no complications. He presents for preventative foot care services. No changes to ROS  Podiatric Exam: Vascular: dorsalis pedis and posterior tibial pulses are absent bilateral. Capillary return is immediate. Temperature gradient is decreased.. Skin turgor WNL  Sensorium: Normal Semmes Weinstein monofilament test. Normal tactile sensation bilaterally. Nail Exam: Pt has thick disfigured discolored nails with subungual debris noted bilateral entire nail hallux through fifth toenails Ulcer Exam: There is no evidence of ulcer or pre-ulcerative changes or infection. Orthopedic Exam: Muscle tone and strength are WNL. No limitations in general ROM. No crepitus or effusions noted. Foot type and digits show no abnormalities. Bony prominences are unremarkable. Skin:  Porokeratosis present at the distal aspect right hallux.. No infection or ulcers  Diagnosis:  Tinea unguium, Pain in right toe, pain in left toes,Porokeratosos right hallux  Treatment & Plan Procedures and Treatment: Consent by patient was obtained for treatment procedures. The patient understood the discussion of treatment and procedures well. All questions were answered thoroughly reviewed. Debridement of mycotic and hypertrophic toenails, 1 through 5 bilateral and clearing of subungual debris. No ulceration, no infection noted. Debride porokeratosis Return Visit-Office Procedure: Patient instructed to return to the office for a follow up visit 3 months for continued evaluation and treatment.

## 2014-12-18 ENCOUNTER — Other Ambulatory Visit: Payer: Self-pay

## 2014-12-28 ENCOUNTER — Other Ambulatory Visit: Payer: Self-pay | Admitting: Internal Medicine

## 2014-12-30 ENCOUNTER — Other Ambulatory Visit: Payer: Self-pay | Admitting: Internal Medicine

## 2015-02-05 ENCOUNTER — Encounter: Payer: Self-pay | Admitting: Internal Medicine

## 2015-02-05 ENCOUNTER — Ambulatory Visit (INDEPENDENT_AMBULATORY_CARE_PROVIDER_SITE_OTHER): Payer: Medicare Other | Admitting: Physician Assistant

## 2015-02-05 VITALS — BP 124/66 | HR 64 | Temp 97.5°F | Resp 16 | Ht 68.25 in | Wt 250.8 lb

## 2015-02-05 DIAGNOSIS — I482 Chronic atrial fibrillation, unspecified: Secondary | ICD-10-CM

## 2015-02-05 DIAGNOSIS — R251 Tremor, unspecified: Secondary | ICD-10-CM

## 2015-02-05 DIAGNOSIS — R5383 Other fatigue: Secondary | ICD-10-CM

## 2015-02-05 DIAGNOSIS — G4733 Obstructive sleep apnea (adult) (pediatric): Secondary | ICD-10-CM | POA: Diagnosis not present

## 2015-02-05 DIAGNOSIS — R350 Frequency of micturition: Secondary | ICD-10-CM | POA: Diagnosis not present

## 2015-02-05 DIAGNOSIS — D518 Other vitamin B12 deficiency anemias: Secondary | ICD-10-CM | POA: Diagnosis not present

## 2015-02-05 DIAGNOSIS — D631 Anemia in chronic kidney disease: Secondary | ICD-10-CM | POA: Diagnosis not present

## 2015-02-05 LAB — HEPATIC FUNCTION PANEL
ALT: 17 U/L (ref 9–46)
AST: 24 U/L (ref 10–35)
Albumin: 4 g/dL (ref 3.6–5.1)
Alkaline Phosphatase: 79 U/L (ref 40–115)
BILIRUBIN DIRECT: 0.2 mg/dL (ref <=0.2)
BILIRUBIN INDIRECT: 0.5 mg/dL (ref 0.2–1.2)
TOTAL PROTEIN: 6.7 g/dL (ref 6.1–8.1)
Total Bilirubin: 0.7 mg/dL (ref 0.2–1.2)

## 2015-02-05 LAB — BASIC METABOLIC PANEL WITH GFR
BUN: 11 mg/dL (ref 7–25)
CO2: 28 mmol/L (ref 20–31)
Calcium: 9.6 mg/dL (ref 8.6–10.3)
Chloride: 99 mmol/L (ref 98–110)
Creat: 0.72 mg/dL (ref 0.70–1.11)
GFR, Est Non African American: 87 mL/min (ref >=60)
GLUCOSE: 84 mg/dL (ref 65–99)
Potassium: 4.6 mmol/L (ref 3.5–5.3)
Sodium: 136 mmol/L (ref 135–146)

## 2015-02-05 LAB — CBC WITH DIFFERENTIAL/PLATELET
Basophils Absolute: 0.1 10*3/uL (ref 0.0–0.1)
Basophils Relative: 1 % (ref 0–1)
Eosinophils Absolute: 0.7 10*3/uL (ref 0.0–0.7)
Eosinophils Relative: 8 % — ABNORMAL HIGH (ref 0–5)
HEMATOCRIT: 43.4 % (ref 39.0–52.0)
HEMOGLOBIN: 14.6 g/dL (ref 13.0–17.0)
LYMPHS PCT: 20 % (ref 12–46)
Lymphs Abs: 1.6 10*3/uL (ref 0.7–4.0)
MCH: 30.7 pg (ref 26.0–34.0)
MCHC: 33.6 g/dL (ref 30.0–36.0)
MCV: 91.2 fL (ref 78.0–100.0)
MPV: 10.1 fL (ref 8.6–12.4)
Monocytes Absolute: 0.9 10*3/uL (ref 0.1–1.0)
Monocytes Relative: 11 % (ref 3–12)
NEUTROS ABS: 4.9 10*3/uL (ref 1.7–7.7)
NEUTROS PCT: 60 % (ref 43–77)
Platelets: 219 10*3/uL (ref 150–400)
RBC: 4.76 MIL/uL (ref 4.22–5.81)
RDW: 13.3 % (ref 11.5–15.5)
WBC: 8.2 10*3/uL (ref 4.0–10.5)

## 2015-02-05 LAB — TSH: TSH: 3.206 u[IU]/mL (ref 0.350–4.500)

## 2015-02-05 LAB — VITAMIN B12: VITAMIN B 12: 707 pg/mL (ref 211–911)

## 2015-02-05 NOTE — Progress Notes (Signed)
Subjective:    Patient ID: Richard Rosales, male    DOB: 1931-01-25, 81 y.o.   MRN: [id number]  HPI 81 y.o. obese WM with history of afib, COPD, preDM, hypothyroidism presents with fatigue, tremors for a year. He was put on zanaflex. He is on his CPAP and 2 L of oxygen, uses [facility]. Will have tremors in both hands with rest, and will have weakness/tremors in his legs with standing/walking. He fell a week ago while trying to reach across for a bag, no LOC, landed on his buttocks. Takes norco ONLY if he has an MRI, he is on tylenol #3 a few a week. Had CT head 11/11/2013 showed old lacunar infaract and microvasulcar changes. Has sore tongue. Has had urinary frequency.   Lab Results  Component Value Date   TSH 3.524 11/14/2014   Blood pressure 124/66, pulse 64, temperature 97.5 F (36.4 C), resp. rate 16, height 5' 8.25" (1.734 m), weight 250 lb 12.8 oz (113.762 kg).    Current Outpatient Prescriptions on File Prior to Visit  Medication Sig Dispense Refill  . acetaminophen-codeine (TYLENOL #3) 300-30 MG per tablet Take 1 tablet by mouth every 4 (four) hours as needed for moderate pain.     Marland Kitchen albuterol (PROVENTIL) (2.5 MG/3ML) 0.083% nebulizer solution Take 3 mLs (2.5 mg total) by nebulization every 4 (four) hours. 300 mL 11  . amoxicillin (AMOXIL) 500 MG capsule     . aspirin 81 MG tablet Take 1 tablet 3 days per week    . azelastine (ASTELIN) 137 MCG/SPRAY nasal spray Place 2 sprays into the nose at bedtime.     Marland Kitchen b complex vitamins tablet Take 1 tablet by mouth every morning.     . budesonide-formoterol (SYMBICORT) 160-4.5 MCG/ACT inhaler Inhale 2 puffs into the lungs 2 (two) times daily. 3 Inhaler 2  . chlorpheniramine (CHLOR-TRIMETON) 4 MG tablet Take 1 tablet in the morning and 2 tablets at bedtime    . Cholecalciferol (VITAMIN D) 2000 UNITS CAPS Take 1 capsule by mouth daily.     Marland Kitchen diltiazem (DILACOR XR) 240 MG 24 hr capsule TAKE 1 CAPSULE TWICE A DAY 180 capsule 0  . docusate sodium  (COLACE) 100 MG capsule Take 100 mg by mouth 2 (two) times daily.      . fish oil-omega-3 fatty acids 1000 MG capsule Take 1 g by mouth 2 (two) times daily.      . furosemide (LASIX) 40 MG tablet Take 1 tablet (40 mg total) by mouth daily. 90 tablet 3  . Glucosamine HCl 1000 MG TABS Take 1 tablet by mouth daily.      Marland Kitchen guaifenesin (HUMIBID E) 400 MG TABS Take 400 mg by mouth 2 (two) times daily.      Marland Kitchen HYDROcodone-acetaminophen (NORCO/VICODIN) 5-325 MG per tablet Take 1 tablet by mouth every 6 (six) hours as needed for moderate pain (Only takes during CT/MRI testing).     Marland Kitchen levothyroxine (SYNTHROID, LEVOTHROID) 100 MCG tablet TAKE 1 TABLET DAILY BEFORE BREAKFAST 90 tablet 99  . losartan (COZAAR) 100 MG tablet Take 1 tablet (100 mg total) by mouth daily. 90 tablet 4  . minoxidil (LONITEN) 2.5 MG tablet Take 1 tablet (2.5 mg total) by mouth daily. For BP 90 tablet 99  . omeprazole (PRILOSEC) 40 MG capsule Take 1 capsule (40 mg total) by mouth 2 (two) times daily. 180 capsule 2  . PROVIGIL 200 MG tablet Take 1/2 tablet by mouth on Wed and Sun when needed (when  pt drives)    . tiotropium (SPIRIVA) 18 MCG inhalation capsule Place 1 capsule (18 mcg total) into inhaler and inhale daily. 90 capsule 2  . tiZANidine (ZANAFLEX) 4 MG tablet      No current facility-administered medications on file prior to visit.   Past Medical History  Diagnosis Date  . HTN (hypertension)   . Diastolic dysfunction   . Atrial fibrillation   . GERD (gastroesophageal reflux disease)   . Allergic rhinitis   . Diverticulosis of colon     recurrent GI bleeding  . OSA (obstructive sleep apnea)     compliants w/ CPAP  . Hyperplastic colon polyp 2003  . Chronic gastritis   . Positive PPD     remote  . Atrial flutter   . Acute bronchitis   . Anemia     iron deficient  . Shortness of breath   . Venous insufficiency   . Moderate aortic insufficiency   . DJD (degenerative joint disease)   . COPD (chronic obstructive  pulmonary disease)   . Hypothyroidism   . Adenomatous colon polyp    Review of Systems  Constitutional: Positive for fatigue. Negative for fever, chills, diaphoresis, activity change, appetite change and unexpected weight change.  HENT: Negative.   Respiratory: Positive for shortness of breath (unchanged). Negative for apnea, cough, choking, chest tightness, wheezing and stridor.   Cardiovascular: Positive for leg swelling. Negative for chest pain and palpitations.  Gastrointestinal: Negative.  Negative for diarrhea and constipation.  Genitourinary: Positive for urgency. Negative for dysuria, frequency, hematuria, flank pain, decreased urine volume, discharge, penile swelling, scrotal swelling, enuresis, difficulty urinating, genital sores, penile pain and testicular pain.  Musculoskeletal: Positive for arthralgias, gait problem and neck pain. Negative for myalgias, back pain, joint swelling and neck stiffness.  Skin: Negative.  Negative for rash.  Neurological: Positive for tremors. Negative for dizziness, seizures, syncope, facial asymmetry, speech difficulty, weakness, light-headedness, numbness and headaches.  Hematological: Negative.   Psychiatric/Behavioral: Positive for decreased concentration. Negative for suicidal ideas, hallucinations, behavioral problems, confusion, sleep disturbance, self-injury, dysphoric mood and agitation. The patient is not nervous/anxious and is not hyperactive.        Objective:   Physical Exam General Appearance: Obese, in no apparent distress. Eyes: PERRLA, EOMs, conjunctiva no swelling or erythema Sinuses: No Frontal/maxillary tenderness ENT/Mouth: Ext aud canals clear, TMs without erythema, bulging. No erythema, swelling, or exudate on post pharynx. Tonsils not swollen or erythematous. Hearing decrease Neck: Supple, thyroid normal.  Respiratory: Respiratory effort normal, lungs CTAB without wheezing or stridor.02 97% RA Cardio: Irreg Irreg with  holosystolic murmur. Brisk peripheral pulses with 2-3+ edema with compression stockings.  Abdomen: Soft, + BS, obese Non tender, no guarding, rebound, hernias, masses. Lymphatics: Non tender without lymphadenopathy.  Musculoskeletal: Full ROM, 5/5 strength, normal unsteady/antalgic  Skin: Warm, dry without rashes, lesions, ecchymosis.  Neuro: Cranial nerves intact. Normal muscle tone, no cerebellar symptoms.  Psych: Awake and oriented X 3, normal affect, Insight and Judgment appropriate.      Assessment & Plan:  Fatigue- likely multifactorial- obesity, Afib, diastolic HF, OSA, and likely PHTN  rule out infection, check labs Get autotitration CPAP, has been over 10 years Will stop minoxidil for now Weight loss advised.   Tremor No visible tremor here, will check labs, may need referral neuro rule out parkinsonism.     Future Appointments Date Time Provider [place]  02/19/2015 3:30 PM Unk Pinto, MD [facility] None  03/13/2015 3:45 PM Gardiner Barefoot, DPM [facility]  08/14/2015 3:00 PM Unk Pinto, MD [facility] None

## 2015-02-05 NOTE — Patient Instructions (Signed)
Will check labs Will send an order to [facility] to autotitrate CPAP  Stop minoxidil for now  Follow up 2 weeks Dr. Melford Aase  Fatigue Fatigue is a feeling of tiredness, lack of energy, lack of motivation, or feeling tired all the time. Having enough rest, good nutrition, and reducing stress will normally reduce fatigue. Consult your caregiver if it persists. The nature of your fatigue will help your caregiver to find out its cause. The treatment is based on the cause.  CAUSES  There are many causes for fatigue. Most of the time, fatigue can be traced to one or more of your habits or routines. Most causes fit into one or more of three general areas. They are: Lifestyle problems  Sleep disturbances.  Overwork.  Physical exertion.  Unhealthy habits.  Poor eating habits or eating disorders.  Alcohol and/or drug use .  Lack of proper nutrition (malnutrition). Psychological problems  Stress and/or anxiety problems.  Depression.  Grief.  Boredom. Medical Problems or Conditions  Anemia.  Pregnancy.  Thyroid gland problems.  Recovery from major surgery.  Continuous pain.  Emphysema or asthma that is not well controlled  Allergic conditions.  Diabetes.  Infections (such as mononucleosis).  Obesity.  Sleep disorders, such as sleep apnea.  Heart failure or other heart-related problems.  Cancer.  Kidney disease.  Liver disease.  Effects of certain medicines such as antihistamines, cough and cold remedies, prescription pain medicines, heart and blood pressure medicines, drugs used for treatment of cancer, and some antidepressants. SYMPTOMS  The symptoms of fatigue include:   Lack of energy.  Lack of drive (motivation).  Drowsiness.  Feeling of indifference to the surroundings. DIAGNOSIS  The details of how you feel help guide your caregiver in finding out what is causing the fatigue. You will be asked about your present and past health condition. It is  important to review all medicines that you take, including prescription and non-prescription items. A thorough exam will be done. You will be questioned about your feelings, habits, and normal lifestyle. Your caregiver may suggest blood tests, urine tests, or other tests to look for common medical causes of fatigue.  TREATMENT  Fatigue is treated by correcting the underlying cause. For example, if you have continuous pain or depression, treating these causes will improve how you feel. Similarly, adjusting the dose of certain medicines will help in reducing fatigue.  HOME CARE INSTRUCTIONS   Try to get the required amount of good sleep every night.  Eat a healthy and nutritious diet, and drink enough water throughout the day.  Practice ways of relaxing (including yoga or meditation).  Exercise regularly.  Make plans to change situations that cause stress. Act on those plans so that stresses decrease over time. Keep your work and personal routine reasonable.  Avoid street drugs and minimize use of alcohol.  Start taking a daily multivitamin after consulting your caregiver. SEEK MEDICAL CARE IF:   You have persistent tiredness, which cannot be accounted for.  You have fever.  You have unintentional weight loss.  You have headaches.  You have disturbed sleep throughout the night.  You are feeling sad.  You have constipation.  You have dry skin.  You have gained weight.  You are taking any new or different medicines that you suspect are causing fatigue.  You are unable to sleep at night.  You develop any unusual swelling of your legs or other parts of your body. SEEK IMMEDIATE MEDICAL CARE IF:   You are feeling  confused.  Your vision is blurred.  You feel faint or pass out.  You develop severe headache.  You develop severe abdominal, pelvic, or back pain.  You develop chest pain, shortness of breath, or an irregular or fast heartbeat.  You are unable to pass a  normal amount of urine.  You develop abnormal bleeding such as bleeding from the rectum or you vomit blood.  You have thoughts about harming yourself or committing suicide.  You are worried that you might harm someone else. MAKE SURE YOU:   Understand these instructions.  Will watch your condition.  Will get help right away if you are not doing well or get worse. Document Released: 04/06/2007 Document Revised: 09/01/2011 Document Reviewed: 10/11/2013 [facility] Patient Information 2015 [place], [place]. This information is not intended to replace advice given to you by your health care provider. Make sure you discuss any questions you have with your health care provider.

## 2015-02-06 LAB — URINALYSIS, ROUTINE W REFLEX MICROSCOPIC
Bilirubin Urine: NEGATIVE
Glucose, UA: NEGATIVE
Hgb urine dipstick: NEGATIVE
Ketones, ur: NEGATIVE
NITRITE: NEGATIVE
PH: 6.5 (ref 5.0–8.0)
Protein, ur: NEGATIVE
SPECIFIC GRAVITY, URINE: 1.019 (ref 1.001–1.035)

## 2015-02-06 LAB — URINALYSIS, MICROSCOPIC ONLY
Bacteria, UA: NONE SEEN [HPF]
CASTS: NONE SEEN [LPF]
CRYSTALS: NONE SEEN [HPF]
Squamous Epithelial / LPF: NONE SEEN [HPF] (ref <=5)
Yeast: NONE SEEN [HPF]

## 2015-02-06 LAB — URINE CULTURE: Colony Count: 10000

## 2015-02-19 ENCOUNTER — Encounter: Payer: Self-pay | Admitting: Internal Medicine

## 2015-02-19 ENCOUNTER — Ambulatory Visit (INDEPENDENT_AMBULATORY_CARE_PROVIDER_SITE_OTHER): Payer: Medicare Other | Admitting: Internal Medicine

## 2015-02-19 VITALS — BP 154/72 | HR 60 | Temp 97.7°F | Resp 16 | Ht 68.25 in | Wt 252.4 lb

## 2015-02-19 DIAGNOSIS — E559 Vitamin D deficiency, unspecified: Secondary | ICD-10-CM | POA: Diagnosis not present

## 2015-02-19 DIAGNOSIS — J42 Unspecified chronic bronchitis: Secondary | ICD-10-CM | POA: Diagnosis not present

## 2015-02-19 DIAGNOSIS — I4891 Unspecified atrial fibrillation: Secondary | ICD-10-CM

## 2015-02-19 DIAGNOSIS — Z0000 Encounter for general adult medical examination without abnormal findings: Secondary | ICD-10-CM | POA: Insufficient documentation

## 2015-02-19 DIAGNOSIS — K219 Gastro-esophageal reflux disease without esophagitis: Secondary | ICD-10-CM | POA: Diagnosis not present

## 2015-02-19 DIAGNOSIS — E039 Hypothyroidism, unspecified: Secondary | ICD-10-CM

## 2015-02-19 DIAGNOSIS — E782 Mixed hyperlipidemia: Secondary | ICD-10-CM

## 2015-02-19 DIAGNOSIS — M6281 Muscle weakness (generalized): Secondary | ICD-10-CM | POA: Diagnosis not present

## 2015-02-19 DIAGNOSIS — R7309 Other abnormal glucose: Secondary | ICD-10-CM

## 2015-02-19 DIAGNOSIS — I10 Essential (primary) hypertension: Secondary | ICD-10-CM | POA: Diagnosis not present

## 2015-02-19 DIAGNOSIS — I5032 Chronic diastolic (congestive) heart failure: Secondary | ICD-10-CM

## 2015-02-19 DIAGNOSIS — G4733 Obstructive sleep apnea (adult) (pediatric): Secondary | ICD-10-CM

## 2015-02-19 DIAGNOSIS — Z6837 Body mass index (BMI) 37.0-37.9, adult: Secondary | ICD-10-CM

## 2015-02-19 DIAGNOSIS — R7303 Prediabetes: Secondary | ICD-10-CM

## 2015-02-19 NOTE — Progress Notes (Signed)
Patient ID: Richard Rosales, male   DOB: 03/28/1931, 81 y.o.   MRN: [id number]   This very nice 81 y.o. MWM presents for  follow up with Hypertension, Hyperlipidemia, Pre-Diabetes and Vitamin D Deficiency. Patient had been seen recently for evaluation of ongoing c/o fatigue and lab screening was unremarkable. Patient lacks insight of the contributions of his severe obesity and poly co-morbidities of O2 dependent COPD, chronic diastolic heart failure due to [facility], and chronic afib/flutter for which he has declined anticoagulants due to repeated GI bleeding.    Patient is treated for HTN since 1998 & BP has been controlled at home. Today's BP: (!) 154/72 mmHg. Patient has had no complaints of any cardiac type chest pain, palpitations, dyspnea/orthopnea/PND, dizziness, claudication, or dependent edema.   Hyperlipidemia is controlled with diet & meds. Patient denies myalgias or other med SE's. Last Lipids were at goal - Cholesterol 137; HDL 50; LDL 63; Triglycerides 121 on  11/14/2014.   Also, the patient has history of Morbid Obesity (BMI 37.84) and consequent PreDiabetes with A1c 6.4% in 2012 and has had no symptoms of reactive hypoglycemia, diabetic polys, paresthesias or visual blurring.  He has lost about 35# over the last 3 years. Last A1c was 5.9% on 11/14/2014.    Further, the patient also has history of Vitamin D Deficiency of 18 in 2008 and supplements vitamin D without any suspected side-effects. Last vitamin D was 48 on 07/26/2014.     Medication Sig  . TYLENOL #3 Take 1 tablet by mouth every 4 (four) hours as needed for moderate pain.   Marland Kitchen albuterol  (2.5 MG/3ML)  neb soln Take 3 mLs (2.5 mg total) by nebulization every 4 (four) hours.  Marland Kitchen amoxicillin  500 MG capsule   . aspirin 81 MG tablet Take 1 tablet 3 days per week  . azelastine (ASTELIN)  nasal spray Place 2 sprays into the nose at bedtime.   Marland Kitchen b complex vitamins tablet Take 1 tablet by mouth every morning.   . SYMBICORT 160-4.5   inhaler Inhale 2 puffs into the lungs 2 (two) times daily.  . chlorpheniramine  4 MG tablet Take 1 tablet in the morning and 2 tablets at bedtime  . VITAMIN D 2000 UNITS CAPS Take 1 capsule by mouth daily.   Marland Kitchen diltiazem (DILACOR XR) 240 MG TAKE 1 CAPSULE TWICE A DAY  . docusate  (COLACE) 100 MG cap Take 100 mg by mouth 2 (two) times daily.    . fish oil 1000 MG capsule Take 1 g by mouth 2 (two) times daily.    . furosemide (LASIX) 40 MG tablet Take 1 tablet (40 mg total) by mouth daily.  . Glucosamine HCl 1000 MG TABS Take 1 tablet by mouth daily.    Marland Kitchen guaifenesin (HUMIBID E) 400 MG TABS Take 400 mg by mouth 2 (two) times daily.    . NORCO 5-325 MG Take 1 tablet by mouth every 6 (six) hours as needed for moderate pain (Only takes during CT/MRI testing).   Marland Kitchen levothyroxine  100 MCG tablet TAKE 1 TABLET DAILY BEFORE BREAKFAST  . losartan 100 MG tablet Take 1 tablet (100 mg total) by mouth daily.  . minoxidil  2.5 MG tablet Take 1 tablet (2.5 mg total) by mouth daily. For BP  . omeprazole40 MG capsule Take 1 capsule (40 mg total) by mouth 2 (two) times daily.  Marland Kitchen PROVIGIL 200 MG tablet Take 1/2 tablet by mouth on Wed and Sun when needed (when pt  drives)  . SPIRIVA 18 MCG inhalation capsule Place 1 capsule (18 mcg total) into inhaler and inhale daily.  Marland Kitchen tiZANidine / ZANAFLEX 4 MG tablet    Allergies  Allergen Reactions  . Ace Inhibitors Cough  . Beta Adrenergic Blockers Other (See Comments)    bradycardia  . Decadron [Dexamethasone]   . Fluticasone-Salmeterol     Unable to remember per pt  . Hytrin [Terazosin]     Nasal congestion   . Prednisone     Shakes / tremors  . Vioxx [Rofecoxib] Other (See Comments)    dyspepsia   PMHx:   Past Medical History  Diagnosis Date  . HTN (hypertension)   . Diastolic dysfunction   . Atrial fibrillation   . GERD (gastroesophageal reflux disease)   . Allergic rhinitis   . Diverticulosis of colon     recurrent GI bleeding  . OSA (obstructive  sleep apnea)     compliants w/ CPAP  . Hyperplastic colon polyp 2003  . Chronic gastritis   . Positive PPD     remote  . Atrial flutter   . Acute bronchitis   . Anemia     iron deficient  . Shortness of breath   . Venous insufficiency   . Moderate aortic insufficiency   . DJD (degenerative joint disease)   . COPD (chronic obstructive pulmonary disease)   . Hypothyroidism   . Adenomatous colon polyp    Immunization History  Administered Date(s) Administered  . DT 07/26/2014  . DTaP 06/24/2003  . Influenza Split 04/24/2011, 03/23/2012, 03/10/2013  . Influenza Whole 03/23/2009, 03/23/2010  . Influenza, High Dose Seasonal PF 04/13/2014  . Pneumococcal Polysaccharide-23 05/23/1994, 11/22/2001, 05/28/2010  . Tdap 06/24/2003  . Zoster 09/14/2013   Past Surgical History  Procedure Laterality Date  . Cardiac catheterization  7.27.05    revealed a preserved EF with no CAD  . Tonsillectomy    . Total knee arthroplasty      bilateral  . Nasal sinus surgery    . Colonoscopy    . Joint replacement  2001    lt total knee-rt one done 00  . Hernia repair      bilateral  . Hernia repair    . Transurethral resection of prostate    . Polypectomy      adenomatous polyps 2010   FHx:    Reviewed / unchanged  SHx:    Reviewed / unchanged  Systems Review:  Constitutional: Denies fever, chills, wt changes, headaches, insomnia, fatigue, night sweats, change in appetite. Eyes: Denies redness, blurred vision, diplopia, discharge, itchy, watery eyes.  ENT: Denies discharge, congestion, post nasal drip, epistaxis, sore throat, earache, hearing loss, dental pain, tinnitus, vertigo, sinus pain, snoring.  CV: Denies chest pain, palpitations, irregular heartbeat, syncope, dyspnea, diaphoresis, orthopnea, PND, claudication or edema. Respiratory: denies cough, dyspnea, DOE, pleurisy, hoarseness, laryngitis, wheezing.  Gastrointestinal: Denies dysphagia, odynophagia, heartburn, reflux, water  brash, abdominal pain or cramps, nausea, vomiting, bloating, diarrhea, constipation, hematemesis, melena, hematochezia  or hemorrhoids. Genitourinary: Denies dysuria, frequency, urgency, nocturia, hesitancy, discharge, hematuria or flank pain. Musculoskeletal: Denies arthralgias, myalgias, stiffness, jt. swelling, pain, limping or strain/sprain.  Skin: Denies pruritus, rash, hives, warts, acne, eczema or change in skin lesion(s). Neuro: No weakness, tremor, incoordination, spasms, paresthesia or pain. Psychiatric: Denies confusion, memory loss or sensory loss. Endo: Denies change in weight, skin or hair change.  Heme/Lymph: No excessive bleeding, bruising or enlarged lymph nodes.  Physical Exam  BP 154/72   Pulse 60  Temp 97.7 F   Resp 16  Ht 5' 8.25"   Wt 252 lb 6.4 oz     BMI 38.08    O2 sats at 91-93% at rest today off O2.   Appears chronically ill , over nourished, morbidly obese and in no distress. No Cyanosis, icterus with sl clubbing of the nails.   Eyes: PERRLA, EOMs, conjunctiva no swelling or erythema. Sinuses: No frontal/maxillary tenderness ENT/Mouth: EAC's clear, TM's nl w/o erythema, bulging. Nares clear w/o erythema, swelling, exudates. Oropharynx clear without erythema or exudates. Oral hygiene is good. Tongue normal, non obstructing. Hearing intact.  Neck: Supple. Thyroid nl. Car 2+/2+ without bruits, nodes or JVD. Chest: Severe gibbous deformity. Respirations distant w/o rales, rhonchi, wheezing or stridor.  Cor: Heart sounds soft and irregular rate and rhythm without gr 2/4 sys murmur and no gallops, clicks or rubs appreciated. Peripheral pulses obscured by 1-2 ankle/pedal edema.  Abdomen: Soft & bowel sounds normal. Non-tender w/o guarding, rebound, hernias, masses, or organomegaly.  Lymphatics: Unremarkable.  Musculoskeletal: Generalized decrease in muscle power, tone & bulk and ambulates with two cane to stabilize his balance. Skin: Warm, dry without exposed  rashes, lesions or ecchymosis apparent.  Neuro: Cranial nerves intact, reflexes equal bilaterally. Sensory-motor testing grossly intact. Tendon reflexes grossly intact.  Pysch: Alert & oriented x 3.  Insight and judgement nl & appropriate. No ideations.  Assessment and Plan:  1. Essential hypertension   2. Mixed hyperlipidemia  - Lipid panel  3. Prediabetes  - Hemoglobin A1c - Insulin, random  4. Vitamin D deficiency  - Vit D  25 hydroxy  5. Hypothyroidism   6. Gastroesophageal reflux disease   7. Chronic bronchitis   8. Obstructive sleep apnea   9. Atrial fibrillation, unspecified   10. Chronic diastolic heart failure   11. Morbid obesity (BMI 37.84)   Recommended regular exercise, BP monitoring, weight control, and discussed med and SE's. Recommended labs to assess and monitor clinical status. Further disposition pending results of labs. Over 30 minutes of exam, counseling, chart review was performed

## 2015-02-19 NOTE — Patient Instructions (Signed)

## 2015-02-20 LAB — LIPID PANEL
Cholesterol: 149 mg/dL (ref 125–200)
HDL: 49 mg/dL (ref >=40)
LDL CALC: 76 mg/dL (ref <130)
Total CHOL/HDL Ratio: 3 Ratio (ref <=5.0)
Triglycerides: 118 mg/dL (ref <150)
VLDL: 24 mg/dL (ref <30)

## 2015-02-20 LAB — INSULIN, RANDOM: INSULIN: 13.1 u[IU]/mL (ref 2.0–19.6)

## 2015-02-20 LAB — HEMOGLOBIN A1C
HEMOGLOBIN A1C: 5.7 % — AB (ref <5.7)
MEAN PLASMA GLUCOSE: 117 mg/dL — AB (ref <117)

## 2015-02-20 LAB — VITAMIN D 25 HYDROXY (VIT D DEFICIENCY, FRACTURES): VIT D 25 HYDROXY: 44 ng/mL (ref 30–100)

## 2015-02-23 ENCOUNTER — Encounter: Payer: Self-pay | Admitting: Internal Medicine

## 2015-02-25 LAB — MYASTHENIA GRAVIS PANEL 2
Acetylcholine Rec Mod Ab: 6 % binding inhibition
Aceytlcholine Rec Bloc Ab: <15 % of inhibition (ref <15)

## 2015-03-01 ENCOUNTER — Encounter: Payer: Self-pay | Admitting: Emergency Medicine

## 2015-03-01 ENCOUNTER — Ambulatory Visit (INDEPENDENT_AMBULATORY_CARE_PROVIDER_SITE_OTHER): Payer: Medicare Other | Admitting: Emergency Medicine

## 2015-03-01 VITALS — BP 136/72 | HR 67 | Ht 68.0 in | Wt 250.6 lb

## 2015-03-01 DIAGNOSIS — G4733 Obstructive sleep apnea (adult) (pediatric): Secondary | ICD-10-CM | POA: Diagnosis not present

## 2015-03-01 DIAGNOSIS — Z23 Encounter for immunization: Secondary | ICD-10-CM | POA: Diagnosis not present

## 2015-03-01 DIAGNOSIS — J42 Unspecified chronic bronchitis: Secondary | ICD-10-CM | POA: Diagnosis not present

## 2015-03-01 NOTE — Assessment & Plan Note (Signed)
Continue CPAP every night. 

## 2015-03-01 NOTE — Assessment & Plan Note (Signed)
Please continue Spiriva and Symbicort as you are taking them Use albuterol (either your hand-held inhaler or your nebulizer) if needed for changes in your breathing, worsening of your breathing. Keep track of whether the medication helps Try using your oxygen more reliably with exertion instead of waiting until you become short of breath due to exertion.  Flu shot today Follow with Dr Lamonte Sakai in 3 months or sooner if you have any problems.

## 2015-03-01 NOTE — Patient Instructions (Signed)
Please continue Spiriva and Symbicort as you are taking them Use albuterol (either your hand-held inhaler or your nebulizer) if needed for changes in your breathing, worsening of your breathing. Keep track of whether the medication helps Try using your oxygen more reliably with exertion instead of waiting until you become short of breath due to exertion.  Flu shot today Follow with Dr Lamonte Sakai in 3 months or sooner if you have any problems.

## 2015-03-01 NOTE — Progress Notes (Signed)
Subjective:  Patient ID: Richard Rosales, male    DOB: 1931/05/02, 81 y.o.   MRN: [id number]  81 yo man, former smoker (quit'80)  For FU of COPD &  OSA on CPAP 12 cm. He has  hx of HTN, A Fib, chronic diastolic dysfxn and LE edema, GERD, diverticulosis, remote positive PPD.   ROV 07/08/13 -- OSA, COPD on Symbicort and Spiriva. Also A flutter, chronic diastolic CHF.  Was seen by VS in 12/14 for an AE-COPD. He is having fatigue, tiredness with heavier exertion. He is still having some cough, nasal drainage. He cut his omeprazole to once a day about 1.5 months ago. Just increased back to bid today.   ROV 09/06/13 -- Hx of OSA, COPD, A flutter, chronic diastolic CHF. He is on CPAP, uses Spiriva and Symbicort. He is SOB with exertion. He has not needed his SABA.  No flares. Able to wear CPAP every night. Has an am cough, usually clear.   ROV 11/24/13 -- Hx of OSA, COPD, A flutter, chronic diastolic CHF. He unfortunately had a fall recently, had to be admitted. A Ct chest was done and found several other findings that need to be evaluated - cardiomegaly, ? Hepatic enlargement / cirrhosis, thyroid nodules. He is having a hacking cough, wakes him up at night, can also happen during the day. He is doing well with his CPAP. He is on spiriva and symbicort. Takes omeprazole bid.   ROV 01/26/14 -- OSA, COPD, A flutter, chronic diastolic CHF. Has a chronic cough and rhinitis.  He returns feeling badly, lots of rhinitis and congestion. He remains on Spiriva and Symbicort. He is coughing up yellow mucous, bad taste.   ROV 03/14/14 -- hx OSA compliant with CPAP, COPD, A flutter, chronic diastolic CHF. Has a chronic cough and rhinitis.  He was treated beginning of August for an AE. He has been having more trouble with congestion over the last 2 weeks since he ran out of chlortrimeton. He is planning to get more.   ROV 05/25/14 -- follow up visit for COPD with chronic bronchitis, obstructive sleep apnea, atrial flutter and  diastolic CHF.  Over the last 2-3 weeks he has been very tired, sleeping in on occasion. Having exertional SOB. Having some congestion, a lot of cough and chest mucous. Wears his CPAP reliably.   ROV 06/27/14 -- hx of Severe COPD with associated chronic bronchitis that frequently exacerbates. He has OSA and is compliant with CPAP. Last time he had sx consistent with acute bronchitis, was treated with pred + clarithro. He has dyspnea with activity, chores that requires him to rest. He is still coughing, gets a hoarse voice, has to clear secretions every am. He had severe mood swings with prednisone.   ROV 09/07/14 -- follow-up visit for Severe COPD with associated chronic bronchitis that frequently exacerbates. He has OSA and is compliant with CPAP. He was treated with levaquin and medrol beginning 09/05/14 - he has had more cough and sputum for the last 10-14 days. Since starting the levaquin and medrol he is doing a bit better, still with DOE and wheeze  ROV 03/01/15 -- follow-up visit for severe COPD, chronic bronchitis, obstructive sleep apnea on CPAP. He last saw TP in May.  He has oxygen that he uses with most activity, uses at home. He had a portable concentrator but didn't like it as much as the tanks. He is on symbicort and spiriva. Breathing appears to be stable at this time. Uses albuterol  very rarely.    Filed Vitals:   03/01/15 1137  BP: 136/72  Pulse: 67  Height: 5\' 8"  (1.727 m)  Weight: 250 lb 9.6 oz (113.671 kg)  SpO2: 96%   Gen: Pleasant, kyphotic, in no distress,  normal affect  ENT: No lesions,  mouth clear,  oropharynx clear, no postnasal drip, some nasal obstruction  Neck: No JVD, no TMG, no carotid bruits  Lungs: No use of accessory muscles, clear without rales or rhonchi, very distant exp wheeze  Cardiovascular: RRR, heart sounds normal, no murmur or gallops, 1+ peripheral edema  Musculoskeletal: No deformities, no cyanosis or clubbing  Neuro: alert, non focal  Skin:  Warm, no lesions or rashes      11/18/13 --  COMPARISON: No prior CT chest. Multiple prior chest x-rays, most  recently 11/17/2013.  FINDINGS:  Contrast opacification of the pulmonary arteries is excellent. No  filling defects within either main pulmonary artery or their  branches in either lung to suggest pulmonary embolism. Heart  markedly enlarged with left ventricular predominance and evidence of  left ventricular hypertrophy. Small pericardial effusion. Moderate 3  vessel coronary atherosclerosis. Severe atherosclerosis involving  the thoracic and upper abdominal aorta and their visualized branches  without evidence of aneurysm. Tortuous thoracic and upper abdominal  aorta.  Emphysematous changes throughout both lungs. Subsegmental airspace  consolidation with air bronchograms in the deep posterior right  lower lobe. Lungs otherwise clear. No pulmonary parenchymal nodules  or masses. No pleural effusions.  Thyroid gland enlargement with multiple nodules, the largest nodules  adjacent to one another in the right lobe measuring approximately  2.5 x 2.2 x 3.1 cm in the mid pole and 3.0 x 3.0 x 2.1 cm in the  lower pole. Scattered normal size lymph nodes in the mediastinum,  hila, and axilla without significant lymphadenopathy. Mild bilateral  gynecomastia.  Irregularity involving the contour of the visualized liver with  relative enlargement of the left lobe. Cysts involving the mid  portions of the visualized kidneys. Moderate pancreatic atrophy.  Hiatal hernia containing predominately intra-abdominal fat.  Diverticulosis involving the visualized distal transverse and  proximal descending colon without evidence of acute diverticulitis.  Bone window images demonstrated diffuse thoracic spondylosis and  exaggeration of the usual thoracic kyphosis.  Review of the MIP images confirms the above findings.   IMPRESSION:  1. No evidence of pulmonary embolism.  2. COPD/emphysema.  Atelectasis involving the right lower lobe. No  acute cardiopulmonary disease otherwise.  3. Multinodular goiter, with adjacent 3 cm nodules in the right  lobe.  4. Hepatic cirrhosis.  5. Moderate pancreatic atrophy.  6. Diverticulosis involving the visualized distal transverse and  proximal descending colon.  7. Cardiomegaly with 3 vessel coronary artery disease. Left  ventricular hypertrophy. Small pericardial effusion.  8. Mild bilateral gynecomastia   Assessment & Plan:   COPD (chronic obstructive pulmonary disease) Please continue Spiriva and Symbicort as you are taking them Use albuterol (either your hand-held inhaler or your nebulizer) if needed for changes in your breathing, worsening of your breathing. Keep track of whether the medication helps Try using your oxygen more reliably with exertion instead of waiting until you become short of breath due to exertion.  Flu shot today Follow with Dr Lamonte Sakai in 3 months or sooner if you have any problems.  Obstructive sleep apnea Continue CPAP every night

## 2015-03-13 ENCOUNTER — Ambulatory Visit: Payer: Medicare Other | Admitting: Podiatry

## 2015-03-13 DIAGNOSIS — B079 Viral wart, unspecified: Secondary | ICD-10-CM | POA: Diagnosis not present

## 2015-03-13 DIAGNOSIS — L259 Unspecified contact dermatitis, unspecified cause: Secondary | ICD-10-CM | POA: Diagnosis not present

## 2015-03-15 ENCOUNTER — Ambulatory Visit (INDEPENDENT_AMBULATORY_CARE_PROVIDER_SITE_OTHER): Payer: Medicare Other | Admitting: Podiatry

## 2015-03-15 ENCOUNTER — Encounter: Payer: Self-pay | Admitting: Podiatry

## 2015-03-15 DIAGNOSIS — B351 Tinea unguium: Secondary | ICD-10-CM

## 2015-03-15 DIAGNOSIS — M79673 Pain in unspecified foot: Secondary | ICD-10-CM

## 2015-03-15 DIAGNOSIS — Q828 Other specified congenital malformations of skin: Secondary | ICD-10-CM

## 2015-03-15 NOTE — Progress Notes (Signed)
Patient ID: Richard Rosales, male   DOB: 05/17/1931, 81 y.o.   MRN: [id number] Complaint:  Visit Type: Patient returns to my office for continued preventative foot care services. Complaint: Patient states" my nails have grown long and thick and become painful to walk and wear shoes".  He also has painful callus on the tip of his big toe right foot. Patient has been diagnosed with DM with no complications. He presents for preventative foot care services. No changes to ROS  Podiatric Exam: Vascular: dorsalis pedis and posterior tibial pulses are absent bilateral. Capillary return is immediate. Temperature gradient is decreased.. Skin turgor WNL  Sensorium: Normal Semmes Weinstein monofilament test. Normal tactile sensation bilaterally. Nail Exam: Pt has thick disfigured discolored nails with subungual debris noted bilateral entire nail hallux through fifth toenails Ulcer Exam: There is no evidence of ulcer or pre-ulcerative changes or infection. Orthopedic Exam: Muscle tone and strength are WNL. No limitations in general ROM. No crepitus or effusions noted. Foot type and digits show no abnormalities. Bony prominences are unremarkable. Skin:  Porokeratosis present at the distal aspect right hallux.. No infection or ulcers  Diagnosis:  Tinea unguium, Pain in right toe, pain in left toes,Porokeratosos right hallux  Treatment & Plan Procedures and Treatment: Consent by patient was obtained for treatment procedures. The patient understood the discussion of treatment and procedures well. All questions were answered thoroughly reviewed. Debridement of mycotic and hypertrophic toenails, 1 through 5 bilateral and clearing of subungual debris. No ulceration, no infection noted. Debride porokeratosis Return Visit-Office Procedure: Patient instructed to return to the office for a follow up visit 3 months for continued evaluation and treatment. 

## 2015-03-22 ENCOUNTER — Ambulatory Visit: Payer: Medicare Other | Admitting: Neurology

## 2015-03-22 ENCOUNTER — Telehealth: Payer: Self-pay | Admitting: Neurology

## 2015-03-22 NOTE — Telephone Encounter (Signed)
Dr. Carles Collet the patient's wife called to r/s his appointment. She states that he has diarrhea and vomiting, and is unable to come in.

## 2015-03-29 ENCOUNTER — Other Ambulatory Visit: Payer: Self-pay | Admitting: Internal Medicine

## 2015-04-09 ENCOUNTER — Ambulatory Visit: Payer: Medicare Other | Admitting: Neurology

## 2015-04-10 ENCOUNTER — Telehealth: Payer: Self-pay | Admitting: *Deleted

## 2015-04-10 NOTE — Telephone Encounter (Signed)
Spouse called and states patient fell yesterday due to poor balance.  Spouse is requesting an order to [facility] for a PT evaluation for gait and balance.  OK per Dr Melford Aase.

## 2015-04-16 ENCOUNTER — Ambulatory Visit (INDEPENDENT_AMBULATORY_CARE_PROVIDER_SITE_OTHER): Payer: Medicare Other | Admitting: Neurology

## 2015-04-16 ENCOUNTER — Other Ambulatory Visit: Payer: Medicare Other

## 2015-04-16 ENCOUNTER — Encounter: Payer: Self-pay | Admitting: Neurology

## 2015-04-16 VITALS — BP 138/72 | HR 92 | Ht 68.0 in | Wt 247.0 lb

## 2015-04-16 DIAGNOSIS — G609 Hereditary and idiopathic neuropathy, unspecified: Secondary | ICD-10-CM | POA: Diagnosis not present

## 2015-04-16 DIAGNOSIS — G214 Vascular parkinsonism: Secondary | ICD-10-CM

## 2015-04-16 LAB — RPR

## 2015-04-16 MED ORDER — CARBIDOPA-LEVODOPA 25-100 MG PO TABS: 1.0000 | ORAL_TABLET | Freq: Three times a day (TID) | ORAL | Status: DC

## 2015-04-16 NOTE — Progress Notes (Signed)
Richard Rosales was seen today in the movement disorders clinic for neurologic consultation at the request of Jacquiline Doe DAVID, MD.  This patient is accompanied in the office by his spouse who supplements the history.  The consultation is for the evaluation of tremor and weakness and to r/o PD.  The records that were made available to me were reviewed.   Pt states that sx's have been going on for 5 years but worse over the last few years.  States that he has fallen 5 times in the last 5 years, but 2 were since this past summer.  Has trouble getting up once he falls.  No fractures.  Didn't have walker when fell.  Mother had hx of PD   Specific Symptoms:  Tremor: Yes.  , legs and hands bilaterally - notices it with standing - better if sits down and will go away if sits.  Doesn't notice that inhalers make it worse.  Is R hand dominant Voice: wife thinks that it is softer but pt thinks that it is not and thinks that COPD makes it softer Sleep: was sleeping well until past week; sleeps in recliner for GERD and uses CPAP  Vivid Dreams:  No.  Acting out dreams:  No. Wet Pillows: No. Postural symptoms:  Yes.    Falls?  Yes.   Bradykinesia symptoms: slow movements, difficulty getting out of a chair and difficulty regaining balance Loss of smell:  Yes.   (thinks that years of smoking destroyed smell) Loss of taste:  No. Urinary Incontinence:  No (does have urinary frequency with lasix) Difficulty Swallowing:  No. Handwriting, micrographia: No. Trouble with ADL's:  Yes.   (able to do everything but he is slow)  Trouble buttoning clothing: Yes.   (but slow) Depression:  No. Memory changes:  Yes.   (slow to recall but not permanent memory loss) Hallucinations:  No. (does think that they have a ghost in the house)  visual distortions: No. N/V:  No. Lightheaded:  No.  Syncope: No. Diplopia:  No. Dyskinesia:  No.  Neuroimaging has  previously been performed.  Had CT brain in  10/2013 and evidence of chronic small vessel disease.   ALLERGIES:   Allergies  Allergen Reactions  . Ace Inhibitors Cough  . Beta Adrenergic Blockers Other (See Comments)    bradycardia  . Decadron [Dexamethasone]   . Fluticasone-Salmeterol     Unable to remember per pt  . Hytrin [Terazosin]     Nasal congestion   . Prednisone     Shakes / tremors  . Vioxx [Rofecoxib] Other (See Comments)    dyspepsia    CURRENT MEDICATIONS:  Outpatient Encounter Prescriptions as of 04/16/2015  Medication Sig  . acetaminophen-codeine (TYLENOL #3) 300-30 MG per tablet Take 1 tablet by mouth every 4 (four) hours as needed for moderate pain.   Marland Kitchen albuterol (PROVENTIL) (2.5 MG/3ML) 0.083% nebulizer solution Take 3 mLs (2.5 mg total) by nebulization every 4 (four) hours.  Marland Kitchen aspirin 81 MG tablet Take 1 tablet 3 days per week  . azelastine (ASTELIN) 137 MCG/SPRAY nasal spray Place 2 sprays into the nose at bedtime.   Marland Kitchen b complex vitamins tablet Take 1 tablet by mouth every morning.   . budesonide-formoterol (SYMBICORT) 160-4.5 MCG/ACT inhaler Inhale 2 puffs into the lungs 2 (two) times daily.  . chlorpheniramine (CHLOR-TRIMETON) 4 MG tablet Take 1 tablet in the morning and 2 tablets at bedtime  . Cholecalciferol (VITAMIN D) 2000 UNITS CAPS Take 1  capsule by mouth daily.   Marland Kitchen diltiazem (DILACOR XR) 240 MG 24 hr capsule TAKE 1 CAPSULE TWICE A DAY  . docusate sodium (COLACE) 100 MG capsule Take 100 mg by mouth 2 (two) times daily.    . fish oil-omega-3 fatty acids 1000 MG capsule Take 1 g by mouth 2 (two) times daily.    . furosemide (LASIX) 40 MG tablet Take 1 tablet (40 mg total) by mouth daily.  . Glucosamine HCl 1000 MG TABS Take 1 tablet by mouth daily.    Marland Kitchen guaifenesin (HUMIBID E) 400 MG TABS Take 400 mg by mouth 2 (two) times daily.    Marland Kitchen HYDROcodone-acetaminophen (NORCO/VICODIN) 5-325 MG per tablet Take 1 tablet by mouth every 6 (six) hours as needed for moderate pain (Only takes during CT/MRI  testing).   Marland Kitchen levothyroxine (SYNTHROID, LEVOTHROID) 100 MCG tablet TAKE 1 TABLET DAILY BEFORE BREAKFAST  . losartan (COZAAR) 100 MG tablet TAKE 1 TABLET DAILY  . omeprazole (PRILOSEC) 40 MG capsule Take 1 capsule (40 mg total) by mouth 2 (two) times daily.  Marland Kitchen tiotropium (SPIRIVA) 18 MCG inhalation capsule Place 1 capsule (18 mcg total) into inhaler and inhale daily.  Marland Kitchen amoxicillin (AMOXIL) 500 MG capsule   . PROVIGIL 200 MG tablet Take 1/2 tablet by mouth on Wed and Sun when needed (when pt drives)   No facility-administered encounter medications on file as of 04/16/2015.    PAST MEDICAL HISTORY:   Past Medical History  Diagnosis Date  . HTN (hypertension)   . Diastolic dysfunction   . Atrial fibrillation ([place])   . GERD (gastroesophageal reflux disease)   . Allergic rhinitis   . Diverticulosis of colon     recurrent GI bleeding  . OSA (obstructive sleep apnea)     compliants w/ CPAP  . Hyperplastic colon polyp 2003  . Chronic gastritis   . Positive PPD     remote  . Atrial flutter ([place])   . Acute bronchitis   . Anemia     iron deficient  . Shortness of breath   . Venous insufficiency   . Moderate aortic insufficiency   . DJD (degenerative joint disease)   . COPD (chronic obstructive pulmonary disease) ([place])   . Hypothyroidism   . Adenomatous colon polyp     PAST SURGICAL HISTORY:   Past Surgical History  Procedure Laterality Date  . Cardiac catheterization  7.27.05    revealed a preserved EF with no CAD  . Tonsillectomy    . Total knee arthroplasty      bilateral  . Nasal sinus surgery    . Colonoscopy    . Joint replacement  2001    lt total knee-rt one done 00  . Hernia repair Bilateral   . Shoulder arthroscopy    . Polypectomy      adenomatous polyps 2010  . Toe surgery Right     SOCIAL HISTORY:   Social History   Social History  . Marital Status: Married    Spouse Name: N/A  . Number of Children: 4  . Years of Education: N/A   Occupational  History  . part-time as a Theme park manager at [employer]  . Smoking status: Former Smoker -- 1.00 packs/day for 20 years    Types: Cigarettes    Quit date: 08/01/1978  . Smokeless tobacco: Never Used  . Alcohol Use: 0.0 oz/week    0 Standard drinks or equivalent per week  Comment: small glass of wine daily  . Drug Use: No  . Sexual Activity: Not on file   Other Topics Concern  . Not on file   Social History Narrative   Patient lives in [place]   Retired [employer] but works part-time as a Theme park manager at [employer] in [place].    FAMILY HISTORY:   Family Status  Relation Status Death Age  . Mother Deceased     HTN, PD  . Father Deceased     DM, heart failure  . Brother Deceased 21    lung CA (worked with chemicals)  . Sister Deceased 96    sudden cardiac disease  . Child Alive     4, one with degenerative disease    ROS:  Lost 51 lbs (trying).  A complete 10 system review of systems was obtained and was unremarkable apart from what is mentioned above.  PHYSICAL EXAMINATION:    VITALS:   Filed Vitals:   04/16/15 1238  BP: 138/72  Pulse: 92  Height: 5\' 8"  (1.727 m)  Weight: 247 lb (112.038 kg)    GEN:  The patient appears stated age and is in NAD. HEENT:  Normocephalic, atraumatic.  The mucous membranes are moist. The superficial temporal arteries are without ropiness or tenderness. CV:  RRR Lungs:  There are diffuse posterior exp wheezes Neck/HEME:  There are no carotid bruits bilaterally.  Cardiac murmur radiates to the bilateral carotids.  Neck is held flexed with the chin on the chest  Neurological examination:  Orientation: The patient is alert and oriented x3. Fund of knowledge is appropriate.  Recent and remote memory are intact.  Attention and concentration are normal.    Able to name objects and repeat phrases. Cranial nerves: There is good facial symmetry. Pupils are equal round and  reactive to light bilaterally. Fundoscopic exam reveals clear margins bilaterally. Extraocular muscles are intact. The visual fields are full to confrontational testing. The speech is fluent and clear.  The patient is able to make the gutteral sounds without difficulty. Soft palate rises symmetrically and there is no tongue deviation. Hearing is intact to conversational tone. Sensation: Sensation is intact to light and pinprick throughout (facial, trunk, extremities). Vibration is decreased at the bilateral big toe. There is no extinction with double simultaneous stimulation. There is no sensory dermatomal level identified. Motor: Strength is 5/5 in the bilateral upper and lower extremities.   Shoulder shrug is equal and symmetric.  There is no pronator drift. Deep tendon reflexes: Deep tendon reflexes are 1/4 at the bilateral biceps, triceps, brachioradialis, patella and absent achilles. Plantar responses are downgoing bilaterally.  Movement examination: Tone: There is normal tone in the bilateral upper extremities.  The tone in the lower extremities is normal.  Abnormal movements: none, even with distraction Coordination:  There is no decremation with RAM's, with any form of RAMS, including alternating supination and pronation of the forearm, hand opening and closing, finger taps, heel taps and toe taps. Gait and Station: The patient has  difficulty arising out of a deep-seated chair without the use of the hands. The patient's stride length is decreased with antalgic gait.  He is better with his walker     Labs:  Lab Results  Component Value Date   HGBA1C 5.7* 02/19/2015   Lab Results  Component Value Date   MLYYTKPT46 568 02/05/2015   Lab Results  Component Value Date   TSH 3.206 02/05/2015     Chemistry  Component Value Date/Time   NA 136 02/05/2015 1149   K 4.6 02/05/2015 1149   CL 99 02/05/2015 1149   CO2 28 02/05/2015 1149   BUN 11 02/05/2015 1149   CREATININE 0.72  02/05/2015 1149   CREATININE 0.63 11/11/2013 0957      Component Value Date/Time   CALCIUM 9.6 02/05/2015 1149   ALKPHOS 79 02/05/2015 1149   AST 24 02/05/2015 1149   ALT 17 02/05/2015 1149   BILITOT 0.7 02/05/2015 1149     Lab Results  Component Value Date   FOLATE >20.0 11/26/2010   ASSESSMENT/PLAN:  1.  Gait instability  -think multifactorial.  Is some evidence of peripheral neuropathy which I think could be contributing.  He has already had most lab work to look at reversible causes.  We will look at SPEP/UPEP with immunofixation along with RPR.  We discussed safety in detail.  Discussed balance PT at neurorehab center and said that he has therapy scheduled elsewhere starting tomorrow.  -I told the patient that I do not see evidence of idiopathic Parkinson's disease.  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  Discussed with him that patients with vascular parkinsonism on a responding to levodopa about 30% of the time.  He would like to try and see if it helps.  If not, we could discontinue it.  Again, therapy and exercise would be important.    -agree with walker at all times. 2.  Will determine f/u based on response to levodopa.  Pt preferred to hold on making appt for now.  Much greater than 50% of this visit was spent in counseling with the patient and the family.  Total face to face time:  60 min

## 2015-04-16 NOTE — Patient Instructions (Signed)
1. Your provider has requested that you have labwork completed today. Please go to [facility]ology on the second floor of this building before leaving the office today. You do not need to check in. If you are not called within 15 minutes please check with the front desk.  2. Start Carbidopa Levodopa as follows: 1/2 tab three times a day before meals x 1 wk, then 1/2 in am & noon & 1 in evening for a week, then 1/2 in am &1 at noon &one in evening for a week, then 1 tablet three times a day before meals.

## 2015-04-17 ENCOUNTER — Other Ambulatory Visit: Payer: Self-pay

## 2015-04-17 ENCOUNTER — Telehealth: Payer: Self-pay | Admitting: Neurology

## 2015-04-17 LAB — FOLATE: Folate: >20 ng/mL

## 2015-04-17 NOTE — Telephone Encounter (Signed)
Attempted to reach patient. No answer. Answering machine didn't pick up.

## 2015-04-17 NOTE — Telephone Encounter (Signed)
Pt called for a refill of med?//to be sent to [facility]//[phone]

## 2015-04-17 NOTE — Telephone Encounter (Signed)
PT's wife Izora Gala called in regards to Physical Therapy for her husband/Dawn CB# [phone] to leave message

## 2015-04-17 NOTE — Telephone Encounter (Signed)
Spoke with patient.  He will have his wife call me in the morning.

## 2015-04-18 ENCOUNTER — Other Ambulatory Visit: Payer: Self-pay | Admitting: *Deleted

## 2015-04-18 DIAGNOSIS — G609 Hereditary and idiopathic neuropathy, unspecified: Secondary | ICD-10-CM

## 2015-04-18 DIAGNOSIS — W19XXXD Unspecified fall, subsequent encounter: Secondary | ICD-10-CM

## 2015-04-18 DIAGNOSIS — G214 Vascular parkinsonism: Secondary | ICD-10-CM

## 2015-04-18 LAB — SPEP & IFE WITH QIG
ALBUMIN ELP: 4 g/dL (ref 3.8–4.8)
ALPHA-1-GLOBULIN: 0.3 g/dL (ref 0.2–0.3)
ALPHA-2-GLOBULIN: 0.7 g/dL (ref 0.5–0.9)
BETA GLOBULIN: 0.4 g/dL (ref 0.4–0.6)
Beta 2: 0.4 g/dL (ref 0.2–0.5)
Gamma Globulin: 0.8 g/dL (ref 0.8–1.7)
IGA: 393 mg/dL — AB (ref 68–379)
IGG (IMMUNOGLOBIN G), SERUM: 855 mg/dL (ref 650–1600)
IgM, Serum: 19 mg/dL — ABNORMAL LOW (ref 41–251)
TOTAL PROTEIN, SERUM ELECTROPHOR: 6.5 g/dL (ref 6.1–8.1)

## 2015-04-18 NOTE — Telephone Encounter (Signed)
Patient's wife called back to let me know that Dr. Carles Collet wanted patient to go to neuro-rehab.  Order placed for PT.

## 2015-04-19 LAB — UIFE/LIGHT CHAINS/TP QN, 24-HR UR
ALPHA 1 UR: DETECTED — AB
ALPHA 2 UR: DETECTED — AB
Albumin, U: DETECTED
BETA UR: DETECTED — AB
Gamma Globulin, Urine: DETECTED — AB
Total Protein, Urine: 24 mg/dL (ref 5–25)

## 2015-04-20 DIAGNOSIS — L821 Other seborrheic keratosis: Secondary | ICD-10-CM | POA: Diagnosis not present

## 2015-04-20 DIAGNOSIS — B079 Viral wart, unspecified: Secondary | ICD-10-CM | POA: Diagnosis not present

## 2015-05-03 ENCOUNTER — Telehealth: Payer: Self-pay | Admitting: Neurology

## 2015-05-03 NOTE — Telephone Encounter (Signed)
Patient made aware labs okay. Number given to contact the neurorehab center about scheduling therapy. They had contacted him and said they would get back with him and he hadn't heard. He will follow up.

## 2015-05-03 NOTE — Telephone Encounter (Signed)
Pt would like the results of the blood work and needs to know the status of the neurophysical therapy  please call [phone]

## 2015-05-07 ENCOUNTER — Ambulatory Visit (INDEPENDENT_AMBULATORY_CARE_PROVIDER_SITE_OTHER): Payer: Medicare Other | Admitting: Internal Medicine

## 2015-05-07 ENCOUNTER — Encounter: Payer: Self-pay | Admitting: Internal Medicine

## 2015-05-07 VITALS — BP 128/62 | HR 80 | Ht 68.0 in | Wt 248.2 lb

## 2015-05-07 DIAGNOSIS — I10 Essential (primary) hypertension: Secondary | ICD-10-CM | POA: Diagnosis not present

## 2015-05-07 DIAGNOSIS — G4733 Obstructive sleep apnea (adult) (pediatric): Secondary | ICD-10-CM

## 2015-05-07 DIAGNOSIS — I482 Chronic atrial fibrillation, unspecified: Secondary | ICD-10-CM

## 2015-05-07 NOTE — Progress Notes (Signed)
PCP: Alesia Richards, MD  The patient presents today for routine cardiology followup.  Since last being seen in our clinic, the patient reports doing reasonably well. His SOB is stable.  He is unsteady and has fallen several times.  He is clear that these are due to unsteadiness/ mechanical falls and not syncope.  He does have fatigue.  Today, he denies symptoms of palpitations, chest pain,  Dizziness,  or neurologic sequela.  He has stable edema with support hose.  The patient feels that he is tolerating medications without difficulties and is otherwise without complaint today.    Past Medical History  Diagnosis Date  . HTN (hypertension)   . Diastolic dysfunction   . Atrial fibrillation ([place])   . GERD (gastroesophageal reflux disease)   . Allergic rhinitis   . Diverticulosis of colon     recurrent GI bleeding  . OSA (obstructive sleep apnea)     compliants w/ CPAP  . Hyperplastic colon polyp 2003  . Chronic gastritis   . Positive PPD     remote  . Atrial flutter ([place])   . Acute bronchitis   . Anemia     iron deficient  . Shortness of breath   . Venous insufficiency   . Moderate aortic insufficiency   . DJD (degenerative joint disease)   . COPD (chronic obstructive pulmonary disease) ([place])   . Hypothyroidism   . Adenomatous colon polyp   . GI bleed    Past Surgical History  Procedure Laterality Date  . Cardiac catheterization  7.27.05    revealed a preserved EF with no CAD  . Tonsillectomy    . Total knee arthroplasty      bilateral  . Nasal sinus surgery    . Colonoscopy    . Joint replacement  2001    lt total knee-rt one done 00  . Hernia repair Bilateral   . Shoulder arthroscopy    . Polypectomy      adenomatous polyps 2010  . Toe surgery Right     Current Outpatient Prescriptions  Medication Sig Dispense Refill  . acetaminophen-codeine (TYLENOL #3) 300-30 MG per tablet Take 1 tablet by mouth every 4 (four) hours as needed for moderate pain.     Marland Kitchen  albuterol (PROVENTIL) (2.5 MG/3ML) 0.083% nebulizer solution Take 3 mLs (2.5 mg total) by nebulization every 4 (four) hours. 300 mL 11  . amoxicillin (AMOXIL) 500 MG capsule as directed. (DENTAL)    . aspirin 81 MG tablet Take 1 tablet 3 days per week    . azelastine (ASTELIN) 137 MCG/SPRAY nasal spray Place 2 sprays into the nose at bedtime.     Marland Kitchen b complex vitamins tablet Take 1 tablet by mouth every morning.     . budesonide-formoterol (SYMBICORT) 160-4.5 MCG/ACT inhaler Inhale 2 puffs into the lungs 2 (two) times daily. 3 Inhaler 2  . chlorpheniramine (CHLOR-TRIMETON) 4 MG tablet Take 1 tablet by mouth in the morning and 1 tablet at bedtime    . Cholecalciferol (VITAMIN D) 2000 UNITS CAPS Take 1 capsule by mouth daily.     Marland Kitchen diltiazem (DILACOR XR) 240 MG 24 hr capsule TAKE 1 CAPSULE TWICE A DAY 180 capsule 0  . docusate sodium (COLACE) 100 MG capsule Take 100 mg by mouth 2 (two) times daily.      . fish oil-omega-3 fatty acids 1000 MG capsule Take 1 g by mouth 2 (two) times daily.      . furosemide (LASIX) 40 MG tablet Take  1 tablet (40 mg total) by mouth daily. 90 tablet 3  . Glucosamine HCl 1000 MG TABS Take 1 tablet by mouth daily.      Marland Kitchen guaifenesin (HUMIBID E) 400 MG TABS Take 400 mg by mouth 2 (two) times daily.      Marland Kitchen HYDROcodone-acetaminophen (NORCO/VICODIN) 5-325 MG per tablet Take 1 tablet by mouth every 6 (six) hours as needed for moderate pain (Only takes during CT/MRI testing).     Marland Kitchen levothyroxine (SYNTHROID, LEVOTHROID) 100 MCG tablet TAKE 1 TABLET DAILY BEFORE BREAKFAST 90 tablet 99  . losartan (COZAAR) 100 MG tablet TAKE 1 TABLET DAILY 90 tablet 3  . omeprazole (PRILOSEC) 40 MG capsule Take 1 capsule (40 mg total) by mouth 2 (two) times daily. 180 capsule 2  . PROVIGIL 200 MG tablet Take 1/2 tablet by mouth on Wed and Sun when needed (when pt drives)    . tiotropium (SPIRIVA) 18 MCG inhalation capsule Place 1 capsule (18 mcg total) into inhaler and inhale daily. 90 capsule 2    No current facility-administered medications for this visit.    Allergies  Allergen Reactions  . Ace Inhibitors Cough  . Beta Adrenergic Blockers Other (See Comments)    bradycardia  . Decadron [Dexamethasone]   . Fluticasone-Salmeterol     Unable to remember per pt  . Hytrin [Terazosin]     Nasal congestion   . Prednisone     Shakes / tremors  . Vioxx [Rofecoxib] Other (See Comments)    dyspepsia    Social History   Social History  . Marital Status: Married    Spouse Name: N/A  . Number of Children: 4  . Years of Education: N/A   Occupational History  . part-time as a Theme park manager at [employer]  . Smoking status: Former Smoker -- 1.00 packs/day for 20 years    Types: Cigarettes    Quit date: 08/01/1978  . Smokeless tobacco: Never Used  . Alcohol Use: 0.0 oz/week    0 Standard drinks or equivalent per week     Comment: small glass of wine daily  . Drug Use: No  . Sexual Activity: Not on file   Other Topics Concern  . Not on file   Social History Narrative   Patient lives in [place]   Retired [employer] but works part-time as a Theme park manager at [employer] in [place].    Family History  Problem Relation Age of Onset  . Hypertension Mother   . Diabetes Father   . Lung cancer Brother     chemical     Physical Exam: Filed Vitals:   05/07/15 1409  BP: 128/62  Pulse: 80  Height: 5\' 8"  (1.727 m)  Weight: 248 lb 3.2 oz (112.583 kg)    GEN- The patient is overweight appearing, alert and oriented x 3 today.   Head- normocephalic, atraumatic Eyes-  Sclera clear, conjunctiva pink Ears- hearing intact Oropharynx- clear Neck- supple, JVP 8cm Lungs- clear today Heart- irregular rate and rhythm, no murmurs, rubs or gallops, PMI not laterally displaced GI- soft, NT, ND, + BS Extremities- no clubbing, cyanosis, 1+ edema, support hose are in place MS- walks slowly with a cane Neuro- strength and  sensation are intact  ekg today reveals afib , V rate 80 bpm, RBBB, inferior infarct pattern  Assessment and Plan:  1. Hypertensive cardiovascular disease stable No changes  2. Permanent afib Declines anticoagulation again today.  Given falls,  may be a poor candidate for anticoagulation anyway  4. Aortic insufficiency Echo reviewed from 2015  5. OSA Reports compliance with CPAP  Return to see me in 1 year He does not wish to be seen in 6 months  Thompson Grayer MD, [facility] 05/07/2015 2:35 PM

## 2015-05-07 NOTE — Patient Instructions (Signed)

## 2015-05-08 ENCOUNTER — Encounter: Payer: Self-pay | Admitting: Internal Medicine

## 2015-05-08 ENCOUNTER — Ambulatory Visit (INDEPENDENT_AMBULATORY_CARE_PROVIDER_SITE_OTHER): Payer: Medicare Other | Admitting: Internal Medicine

## 2015-05-08 VITALS — BP 124/66 | HR 64 | Temp 97.3°F | Resp 18 | Ht 68.25 in | Wt 247.6 lb

## 2015-05-08 DIAGNOSIS — Z6837 Body mass index (BMI) 37.0-37.9, adult: Secondary | ICD-10-CM | POA: Diagnosis not present

## 2015-05-08 DIAGNOSIS — N472 Paraphimosis: Secondary | ICD-10-CM

## 2015-05-08 MED ORDER — CEPHALEXIN 500 MG PO CAPS: 500.0000 mg | ORAL_CAPSULE | Freq: Four times a day (QID) | ORAL | Status: AC

## 2015-05-08 NOTE — Patient Instructions (Signed)
Paraphimosis Paraphimosis is a serious condition in which the fold of skin that stretches over the tip of the penis (foreskin) becomes stuck when it is pulled back. This condition blocks blood from flowing away from the penis tip, and that causes swelling that gets worse and worse. Paraphimosis needs to be treated right away. CAUSES This condition may be caused by:  A foreskin that is tighter than normal.  Leaving the foreskin pulled back after a procedure, such as after the placement of a catheter.  A foreskin that has been pulled back for too long.  A forceful pulling back of the foreskin.  Infection under the foreskin.  Trauma to the area, such as a hard hit to the the tip of the penis.  Having sex or masturbating.  Hair or clothing that gets wrapped around the penis. RISK FACTORS This condition is more likely to develop in:  Males who have not had their foreskin removed.  Males who have frequent urological procedures.  Males who have poor hygiene.  Males who need help with daily hygiene from a caregiver. SYMPTOMS Symptoms of this condition include:  A foreskin that cannot be returned to its normal position.  Pain, often at the tip of the penis.  Swelling of the penis.  Anxiety.  Trouble urinating.  Skin that is red or bluish at the tip of the penis. DIAGNOSIS This condition may be diagnosed with a physical exam. You may also have X-rays. TREATMENT This condition may be treated with:  A procedure to force the foreskin back over the tip of the penis. Swelling may first be reduced with:  Ice.  A bandage wrapped tightly around the penis.  Needles to drain any pus or blood that is causing the swelling.  Medicine to relieve pain. Medicine may be given by mouth, through an IV tube, or by an injection to the base of the penis (nerve block).  A procedure in which a small cut (incision) is made in the tightened foreskin to free it and allow it to be pulled back  into place (dorsal slit procedure).  Surgery to remove the foreskin (circumcision). This may be done if the foreskin cannot be moved back into place. Most of the time, treatment can be done in a clinic or a health care provider's office. HOME CARE INSTRUCTIONS  Take over-the-counter and prescription medicines only as told by your health care provider.  Apply any creams to the affected area only as told by your health care provider.  If the treated area is covered with gauze or a bandage (dressing), follow instructions from your health care provider about:  When and how you should change your bandage (dressing).  When you should remove your dressing.  Whether the area can get wet.  Wear loose undergarments to avoid applying pressure to the area.  Follow instructions from your health care provider about avoiding sexual activity.  Keep all follow-up visits as told by your health care provider. This is important. SEEK MEDICAL CARE IF:  The treated area of skin does not heal, or it becomes more irritated, red, or bloody.  Urination is difficult or painful.  Pain in the penis continues, even after you take medicine for pain. SEEK IMMEDIATE MEDICAL CARE IF:  You develop a fever.

## 2015-05-08 NOTE — Progress Notes (Signed)
Subjective:    Patient ID: Richard Rosales, male    DOB: [birth year], 81 y.o.   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 Has some discomfort , but bno frank pains. Denies discharge, dysuria or difficulty voiding.   Medication Sig  . acetaminophen-codeine (TYLENOL #3) 300-30 MG per tablet Take 1 tablet by mouth every 4 (four) hours as needed for moderate pain.   Marland Kitchen albuterol (PROVENTIL) (2.5 MG/3ML) 0.083% nebulizer solution Take 3 mLs (2.5 mg total) by nebulization every 4 (four) hours.  Marland Kitchen amoxicillin (AMOXIL) 500 MG capsule as directed. (DENTAL)  . aspirin 81 MG tablet Take 1 tablet 3 days per week  . azelastine (ASTELIN) 137 MCG/SPRAY nasal spray Place 2 sprays into the nose at bedtime.   Marland Kitchen b complex vitamins tablet Take 1 tablet by mouth every morning.   . budesonide-formoterol (SYMBICORT) 160-4.5 MCG/ACT inhaler Inhale 2 puffs into the lungs 2 (two) times daily.  . chlorpheniramine (CHLOR-TRIMETON) 4 MG tablet Take 1 tablet by mouth in the morning and 1 tablet at bedtime  . Cholecalciferol (VITAMIN D) 2000 UNITS CAPS Take 1 capsule by mouth daily.   Marland Kitchen diltiazem (DILACOR XR) 240 MG 24 hr capsule TAKE 1 CAPSULE TWICE A DAY  . docusate sodium (COLACE) 100 MG capsule Take 100 mg by mouth 2 (two) times daily.    . fish oil-omega-3 fatty acids 1000 MG capsule Take 1 g by mouth 2 (two) times daily.    . furosemide (LASIX) 40 MG tablet Take 1 tablet (40 mg total) by mouth daily.  . Glucosamine HCl 1000 MG TABS Take 1 tablet by mouth daily.    Marland Kitchen guaifenesin (HUMIBID E) 400 MG TABS Take 400 mg by mouth 2 (two) times daily.    Marland Kitchen HYDROcodone-acetaminophen (NORCO/VICODIN) 5-325 MG per tablet Take 1 tablet by mouth every 6 (six) hours as needed for moderate pain (Only takes during CT/MRI testing).   Marland Kitchen levothyroxine (SYNTHROID, LEVOTHROID) 100 MCG tablet TAKE 1 TABLET DAILY BEFORE BREAKFAST  .  losartan (COZAAR) 100 MG tablet TAKE 1 TABLET DAILY  . omeprazole (PRILOSEC) 40 MG capsule Take 1 capsule (40 mg total) by mouth 2 (two) times daily.  Marland Kitchen PROVIGIL 200 MG tablet Take 1/2 tablet by mouth on Wed and Sun when needed (when pt drives)  . tiotropium (SPIRIVA) 18 MCG inhalation capsule Place 1 capsule (18 mcg total) into inhaler and inhale daily.   Allergies  Allergen Reactions  . Ace Inhibitors Cough  . Beta Adrenergic Blockers Other (See Comments)    bradycardia  . Decadron [Dexamethasone]   . Fluticasone-Salmeterol     Unable to remember per pt  . Hytrin [Terazosin]     Nasal congestion   . Prednisone     Shakes / tremors  . Vioxx [Rofecoxib] Other (See Comments)    dyspepsia   Past Medical History  Diagnosis Date  . HTN (hypertension)   . Diastolic dysfunction   . Atrial fibrillation ([place])   . GERD (gastroesophageal reflux disease)   . Allergic rhinitis   . Diverticulosis of colon     recurrent GI bleeding  . OSA (obstructive sleep apnea)     compliants w/ CPAP  . Hyperplastic colon polyp 2003  . Chronic gastritis   . Positive PPD     remote  . Atrial flutter ([place])   . Acute bronchitis   . Anemia  iron deficient  . Shortness of breath   . Venous insufficiency   . Moderate aortic insufficiency   . DJD (degenerative joint disease)   . COPD (chronic obstructive pulmonary disease) ([place])   . Hypothyroidism   . Adenomatous colon polyp   . GI bleed    Past Surgical History  Procedure Laterality Date  . Cardiac catheterization  7.27.05    revealed a preserved EF with no CAD  . Tonsillectomy    . Total knee arthroplasty      bilateral  . Nasal sinus surgery    . Colonoscopy    . Joint replacement  2001    lt total knee-rt one done 00  . Hernia repair Bilateral   . Shoulder arthroscopy    . Polypectomy      adenomatous polyps 2010  . Toe surgery Right    Review of Systems   10 point systems review negative except as above.    Objective:    Physical Exam  BP 124/66 mmHg  Pulse 64  Temp(Src) 97.3 F (36.3 C)  Resp 18  Ht 5' 8.25" (1.734 m)  Wt 247 lb 9.6 oz (112.311 kg)  BMI 37.35 kg/m2  GU - No hernias. Uncircumcised male w/prepuce retracted and appears 15 X 20 mm cystic type swelling of the penile glans frenulum which appears sl inflamed.  No signs of cellulitis.     Assessment & Plan:   1. Paraphimosis  - cephALEXin (KEFLEX) 500 MG capsule; Take 1 capsule (500 mg total) by mouth 4 (four) times daily.  Dispense: 40 capsule; Refill: 0  -Advised patient if not resolve significantly over the coarse of the next 10 days to anticipate referral to Urology.    2. BMI 37.0-37.9, adult

## 2015-05-15 ENCOUNTER — Ambulatory Visit: Payer: Medicare Other | Admitting: Physical Therapy

## 2015-05-16 ENCOUNTER — Other Ambulatory Visit: Payer: Self-pay | Admitting: Internal Medicine

## 2015-05-18 ENCOUNTER — Other Ambulatory Visit: Payer: Self-pay | Admitting: Internal Medicine

## 2015-05-21 ENCOUNTER — Other Ambulatory Visit: Payer: Self-pay | Admitting: *Deleted

## 2015-05-21 MED ORDER — DILTIAZEM HCL ER 240 MG PO CP24: 240.0000 mg | ORAL_CAPSULE | Freq: Two times a day (BID) | ORAL | Status: DC

## 2015-05-24 ENCOUNTER — Ambulatory Visit (INDEPENDENT_AMBULATORY_CARE_PROVIDER_SITE_OTHER): Payer: Medicare Other | Admitting: Podiatry

## 2015-05-24 ENCOUNTER — Other Ambulatory Visit: Payer: Self-pay | Admitting: *Deleted

## 2015-05-24 ENCOUNTER — Encounter: Payer: Self-pay | Admitting: Podiatry

## 2015-05-24 DIAGNOSIS — M79673 Pain in unspecified foot: Secondary | ICD-10-CM

## 2015-05-24 DIAGNOSIS — B351 Tinea unguium: Secondary | ICD-10-CM | POA: Diagnosis not present

## 2015-05-24 DIAGNOSIS — Q828 Other specified congenital malformations of skin: Secondary | ICD-10-CM

## 2015-05-24 MED ORDER — DILTIAZEM HCL ER 240 MG PO CP24: 240.0000 mg | ORAL_CAPSULE | Freq: Two times a day (BID) | ORAL | Status: DC

## 2015-05-24 NOTE — Telephone Encounter (Signed)
Patient left a voicemail on the refill line requesting an rx for 24 capsules of diltiazem be sent to [facility] to get him through until his mail order arrives.

## 2015-05-24 NOTE — Progress Notes (Signed)
Patient ID: Richard Rosales, male   DOB: 01/11/1931, 81 y.o.   MRN: [id number] Complaint:  Visit Type: Patient returns to my office for continued preventative foot care services. Complaint: Patient states" my nails have grown long and thick and become painful to walk and wear shoes".  He also has painful callus on the tip of his big toe right foot. Patient has been diagnosed with DM with no complications. He presents for preventative foot care services. No changes to ROS  Podiatric Exam: Vascular: dorsalis pedis and posterior tibial pulses are absent bilateral. Capillary return is immediate. Temperature gradient is decreased.. Skin turgor WNL  Sensorium: Normal Semmes Weinstein monofilament test. Normal tactile sensation bilaterally. Nail Exam: Pt has thick disfigured discolored nails with subungual debris noted bilateral entire nail hallux through fifth toenails Ulcer Exam: There is no evidence of ulcer or pre-ulcerative changes or infection. Orthopedic Exam: Muscle tone and strength are WNL. No limitations in general ROM. No crepitus or effusions noted. Foot type and digits show no abnormalities. Bony prominences are unremarkable. Skin:  Porokeratosis present at the distal aspect right hallux.. No infection or ulcers  Diagnosis:  Tinea unguium, Pain in right toe, pain in left toes,Porokeratosos right hallux  Treatment & Plan Procedures and Treatment: Consent by patient was obtained for treatment procedures. The patient understood the discussion of treatment and procedures well. All questions were answered thoroughly reviewed. Debridement of mycotic and hypertrophic toenails, 1 through 5 bilateral and clearing of subungual debris. No ulceration, no infection noted. Debride porokeratosis under local anesthesia. Return Visit-Office Procedure: Patient instructed to return to the office for a follow up visit 3 months for continued evaluation and treatment.  Gardiner Barefoot DPM

## 2015-05-25 ENCOUNTER — Encounter: Payer: Self-pay | Admitting: Internal Medicine

## 2015-05-25 ENCOUNTER — Ambulatory Visit (INDEPENDENT_AMBULATORY_CARE_PROVIDER_SITE_OTHER): Payer: Self-pay | Admitting: Internal Medicine

## 2015-05-25 VITALS — BP 122/70 | HR 60 | Temp 97.7°F | Resp 18 | Ht 68.25 in | Wt 249.8 lb

## 2015-05-25 DIAGNOSIS — N472 Paraphimosis: Secondary | ICD-10-CM

## 2015-05-25 NOTE — Progress Notes (Signed)
  Subjective:    Patient ID: Richard Rosales, male    DOB: [birth year], 81 y.o.   MRN: [id number]  HPI Patient returns for f/u reporting no change since last OV after trial with Abx's  when dx'd with a suspected paraphimosis type issue with a bulbous or cystic type swelling of the frenulum.  Meds, Allergies, PMH/PSH - reviewed & unchanged.  Review of Systems  Today's pertinent 10 point systems review negative except as above.    Objective:   Physical Exam  BP 122/70 mmHg  Pulse 60  Temp(Src) 97.7 F (36.5 C)  Resp 18  Ht 5' 8.25" (1.734 m)  Wt 249 lb 12.8 oz (113.309 kg)  BMI 37.68 kg/m2  Focused GU exam find 1.5-2.0 cm cystic swelling of the frenulum which is non tender and there is slight discharge appearing to be monilial exudate around the retracted prepuce. No signs of cellulitis.     Assessment & Plan:   1. Paraphimosis  - Ambulatory referral to Urology   (No Charge OV today)

## 2015-05-28 ENCOUNTER — Ambulatory Visit: Payer: Self-pay | Admitting: Internal Medicine

## 2015-05-28 DIAGNOSIS — N472 Paraphimosis: Secondary | ICD-10-CM | POA: Diagnosis not present

## 2015-05-28 DIAGNOSIS — R3915 Urgency of urination: Secondary | ICD-10-CM | POA: Diagnosis not present

## 2015-05-29 ENCOUNTER — Ambulatory Visit: Payer: Self-pay | Admitting: Internal Medicine

## 2015-05-30 ENCOUNTER — Ambulatory Visit (INDEPENDENT_AMBULATORY_CARE_PROVIDER_SITE_OTHER): Payer: Medicare Other | Admitting: Internal Medicine

## 2015-05-30 ENCOUNTER — Encounter: Payer: Self-pay | Admitting: Internal Medicine

## 2015-05-30 VITALS — BP 134/60 | HR 66 | Temp 98.2°F | Resp 18 | Ht 68.25 in | Wt 247.0 lb

## 2015-05-30 DIAGNOSIS — E559 Vitamin D deficiency, unspecified: Secondary | ICD-10-CM | POA: Diagnosis not present

## 2015-05-30 DIAGNOSIS — R7303 Prediabetes: Secondary | ICD-10-CM

## 2015-05-30 DIAGNOSIS — E782 Mixed hyperlipidemia: Secondary | ICD-10-CM | POA: Diagnosis not present

## 2015-05-30 DIAGNOSIS — E039 Hypothyroidism, unspecified: Secondary | ICD-10-CM

## 2015-05-30 DIAGNOSIS — I10 Essential (primary) hypertension: Secondary | ICD-10-CM | POA: Diagnosis not present

## 2015-05-30 DIAGNOSIS — Z79899 Other long term (current) drug therapy: Secondary | ICD-10-CM

## 2015-05-30 NOTE — Progress Notes (Signed)
Patient ID: DELLON REVETTE, male   DOB: [birth year], 81 y.o.   MRN: [id number]  Assessment and Plan:  Hypertension:  -Continue medication,  -monitor blood pressure at home.  -Continue DASH diet.   -Reminder to go to the ER if any CP, SOB, nausea, dizziness, severe HA, changes vision/speech, left arm numbness and tingling, and jaw pain.  Cholesterol: -Continue diet and exercise.  -Check cholesterol.   Pre-diabetes: -Continue diet and exercise.  -Check A1C  Vitamin D Def: -check level -continue medications.   COPD -recommended using oxygen as needed for his sob at home -cont inhalers -may have increased CO2 retention given fatigue will look at CO2 levels in bloodwork -cont CPAP use every night   Continue diet and meds as discussed. Further disposition pending results of labs.  HPI 81 y.o. male  presents for 3 month follow up with hypertension, hyperlipidemia, prediabetes and vitamin D.   His blood pressure has been controlled at home, today their BP is BP: 134/60 mmHg.   He does workout. He denies chest pain, shortness of breath, dizziness.  He has been doing chair exercises and he is also trying to walk a lot.   He is on cholesterol medication and denies myalgias. His cholesterol is at goal. The cholesterol last visit was:   Lab Results  Component Value Date   CHOL 149 02/19/2015   HDL 49 02/19/2015   LDLCALC 76 02/19/2015   TRIG 118 02/19/2015   CHOLHDL 3.0 02/19/2015     He has been working on diet and exercise for prediabetes, and denies foot ulcerations, hyperglycemia, hypoglycemia , increased appetite, nausea, paresthesia of the feet, polydipsia, polyuria, visual disturbances, vomiting and weight loss. Last A1C in the office was:  Lab Results  Component Value Date   HGBA1C 5.7* 02/19/2015    Patient is on Vitamin D supplement.  Lab Results  Component Value Date   VD25OH 29 02/19/2015     He reports that he has been very tired lately.  He fell asleep the other  day and his granddaughter couldn't wake him up.  He reports that he did not even have his cpap on.  He reports that he uses the cpap every night.    He reports that he is eating a lot less than he was previously.  He is cutting portion sizes down. He is losing weight.    Current Medications:  Current Outpatient Prescriptions on File Prior to Visit  Medication Sig Dispense Refill  . acetaminophen-codeine (TYLENOL #3) 300-30 MG per tablet Take 1 tablet by mouth every 4 (four) hours as needed for moderate pain.     Marland Kitchen albuterol (PROVENTIL) (2.5 MG/3ML) 0.083% nebulizer solution Take 3 mLs (2.5 mg total) by nebulization every 4 (four) hours. 300 mL 11  . amoxicillin (AMOXIL) 500 MG capsule as directed. (DENTAL)    . aspirin 81 MG tablet Take 1 tablet 3 days per week    . azelastine (ASTELIN) 137 MCG/SPRAY nasal spray Place 2 sprays into the nose at bedtime.     Marland Kitchen b complex vitamins tablet Take 1 tablet by mouth every morning.     . budesonide-formoterol (SYMBICORT) 160-4.5 MCG/ACT inhaler Inhale 2 puffs into the lungs 2 (two) times daily. 3 Inhaler 2  . chlorpheniramine (CHLOR-TRIMETON) 4 MG tablet Take 1 tablet by mouth in the morning and 1 tablet at bedtime    . Cholecalciferol (VITAMIN D) 2000 UNITS CAPS Take 1 capsule by mouth daily.     Marland Kitchen diltiazem (DILACOR  XR) 240 MG 24 hr capsule Take 1 capsule (240 mg total) by mouth 2 (two) times daily. 24 capsule 0  . docusate sodium (COLACE) 100 MG capsule Take 100 mg by mouth 2 (two) times daily.      . fish oil-omega-3 fatty acids 1000 MG capsule Take 1 g by mouth 2 (two) times daily.      . furosemide (LASIX) 40 MG tablet Take 1 tablet (40 mg total) by mouth daily. 90 tablet 3  . Glucosamine HCl 1000 MG TABS Take 1 tablet by mouth daily.      Marland Kitchen guaifenesin (HUMIBID E) 400 MG TABS Take 400 mg by mouth 2 (two) times daily.      Marland Kitchen HYDROcodone-acetaminophen (NORCO/VICODIN) 5-325 MG per tablet Take 1 tablet by mouth every 6 (six) hours as needed for  moderate pain (Only takes during CT/MRI testing).     Marland Kitchen levothyroxine (SYNTHROID, LEVOTHROID) 100 MCG tablet TAKE 1 TABLET DAILY BEFORE BREAKFAST 90 tablet 99  . losartan (COZAAR) 100 MG tablet TAKE 1 TABLET DAILY 90 tablet 3  . omeprazole (PRILOSEC) 40 MG capsule TAKE 1 CAPSULE TWICE A DAY 180 capsule 1  . PROVIGIL 200 MG tablet Take 1/2 tablet by mouth on Wed and Sun when needed (when pt drives)    . tiotropium (SPIRIVA) 18 MCG inhalation capsule Place 1 capsule (18 mcg total) into inhaler and inhale daily. 90 capsule 2   No current facility-administered medications on file prior to visit.    Medical History:  Past Medical History  Diagnosis Date  . HTN (hypertension)   . Diastolic dysfunction   . Atrial fibrillation ([place])   . GERD (gastroesophageal reflux disease)   . Allergic rhinitis   . Diverticulosis of colon     recurrent GI bleeding  . OSA (obstructive sleep apnea)     compliants w/ CPAP  . Hyperplastic colon polyp 2003  . Chronic gastritis   . Positive PPD     remote  . Atrial flutter ([place])   . Acute bronchitis   . Anemia     iron deficient  . Shortness of breath   . Venous insufficiency   . Moderate aortic insufficiency   . DJD (degenerative joint disease)   . COPD (chronic obstructive pulmonary disease) ([place])   . Hypothyroidism   . Adenomatous colon polyp   . GI bleed     Allergies:  Allergies  Allergen Reactions  . Ace Inhibitors Cough  . Beta Adrenergic Blockers Other (See Comments)    bradycardia  . Decadron [Dexamethasone]   . Fluticasone-Salmeterol     Unable to remember per pt  . Hytrin [Terazosin]     Nasal congestion   . Prednisone     Shakes / tremors  . Vioxx [Rofecoxib] Other (See Comments)    dyspepsia     Review of Systems:  Review of Systems  Constitutional: Positive for malaise/fatigue. Negative for fever and chills.  HENT: Negative for congestion, ear pain and sore throat.   Respiratory: Negative for cough, shortness of breath  and wheezing.   Cardiovascular: Negative for chest pain, palpitations and leg swelling.  Gastrointestinal: Negative for heartburn, abdominal pain, diarrhea, constipation, blood in stool and melena.  Genitourinary: Negative.   Neurological: Negative for dizziness, sensory change, loss of consciousness and headaches.  Psychiatric/Behavioral: Negative for depression. The patient is not nervous/anxious and does not have insomnia.     Family history- Review and unchanged  Social history- Review and unchanged  Physical Exam: BP  134/60 mmHg  Pulse 66  Temp(Src) 98.2 F (36.8 C) (Temporal)  Resp 18  Ht 5' 8.25" (1.734 m)  Wt 247 lb (112.038 kg)  BMI 37.26 kg/m2 Wt Readings from Last 3 Encounters:  05/30/15 247 lb (112.038 kg)  05/25/15 249 lb 12.8 oz (113.309 kg)  05/08/15 247 lb 9.6 oz (112.311 kg)    General Appearance: Well nourished well developed, in no apparent distress. Eyes: PERRLA, EOMs, conjunctiva no swelling or erythema ENT/Mouth: Ear canals normal without obstruction, swelling, erythma, discharge.  TMs normal bilaterally.  Oropharynx moist, clear, without exudate, or postoropharyngeal swelling. Neck: Supple, thyroid normal,no cervical adenopathy  Respiratory: Respiratory effort normal, Breath sounds clear A&P without rhonchi, wheeze, or rale.  No retractions, no accessory usage. Cardio: RRR with no MRGs. Brisk peripheral pulses without edema.  Abdomen: Soft, + BS,  Non tender, no guarding, rebound, hernias, masses. Musculoskeletal: Full ROM, 5/5 strength, Normal gait Skin: Warm, dry without rashes, lesions, ecchymosis.  Neuro: Awake and oriented X 3, Cranial nerves intact. Normal muscle tone, no cerebellar symptoms. Psych: Normal affect, Insight and Judgment appropriate.    Starlyn Skeans, PA-C 5:13 PM [facility]

## 2015-05-31 LAB — CBC WITH DIFFERENTIAL/PLATELET
Basophils Absolute: 0.1 10*3/uL (ref 0.0–0.1)
Basophils Relative: 1 % (ref 0–1)
Eosinophils Absolute: 0.7 10*3/uL (ref 0.0–0.7)
Eosinophils Relative: 9 % — ABNORMAL HIGH (ref 0–5)
HCT: 43.2 % (ref 39.0–52.0)
HEMOGLOBIN: 13.9 g/dL (ref 13.0–17.0)
LYMPHS ABS: 1.8 10*3/uL (ref 0.7–4.0)
LYMPHS PCT: 22 % (ref 12–46)
MCH: 29.8 pg (ref 26.0–34.0)
MCHC: 32.2 g/dL (ref 30.0–36.0)
MCV: 92.7 fL (ref 78.0–100.0)
MONOS PCT: 11 % (ref 3–12)
MPV: 10.8 fL (ref 8.6–12.4)
Monocytes Absolute: 0.9 10*3/uL (ref 0.1–1.0)
NEUTROS ABS: 4.6 10*3/uL (ref 1.7–7.7)
NEUTROS PCT: 57 % (ref 43–77)
Platelets: 201 10*3/uL (ref 150–400)
RBC: 4.66 MIL/uL (ref 4.22–5.81)
RDW: 13.3 % (ref 11.5–15.5)
WBC: 8 10*3/uL (ref 4.0–10.5)

## 2015-05-31 LAB — BASIC METABOLIC PANEL WITH GFR
BUN: 15 mg/dL (ref 7–25)
CHLORIDE: 99 mmol/L (ref 98–110)
CO2: 27 mmol/L (ref 20–31)
CREATININE: 0.7 mg/dL (ref 0.70–1.11)
Calcium: 9.3 mg/dL (ref 8.6–10.3)
GFR, Est African American: >89 mL/min (ref >=60)
GFR, Est Non African American: 88 mL/min (ref >=60)
Glucose, Bld: 90 mg/dL (ref 65–99)
POTASSIUM: 4.4 mmol/L (ref 3.5–5.3)
Sodium: 137 mmol/L (ref 135–146)

## 2015-05-31 LAB — HEPATIC FUNCTION PANEL
ALK PHOS: 93 U/L (ref 40–115)
ALT: 15 U/L (ref 9–46)
AST: 24 U/L (ref 10–35)
Albumin: 3.9 g/dL (ref 3.6–5.1)
BILIRUBIN DIRECT: 0.1 mg/dL (ref <=0.2)
Indirect Bilirubin: 0.5 mg/dL (ref 0.2–1.2)
Total Bilirubin: 0.6 mg/dL (ref 0.2–1.2)
Total Protein: 6.5 g/dL (ref 6.1–8.1)

## 2015-05-31 LAB — TSH: TSH: 3.267 u[IU]/mL (ref 0.350–4.500)

## 2015-06-20 ENCOUNTER — Ambulatory Visit: Payer: Medicare Other | Attending: Internal Medicine | Admitting: Physical Therapy

## 2015-06-20 DIAGNOSIS — R269 Unspecified abnormalities of gait and mobility: Secondary | ICD-10-CM | POA: Insufficient documentation

## 2015-06-20 DIAGNOSIS — M6281 Muscle weakness (generalized): Secondary | ICD-10-CM | POA: Diagnosis not present

## 2015-06-20 DIAGNOSIS — R2689 Other abnormalities of gait and mobility: Secondary | ICD-10-CM | POA: Diagnosis not present

## 2015-06-20 NOTE — Therapy (Signed)
[facility] [address] [place], [place], [address] Phone: [phone]   Fax:  [phone]  Physical Therapy Evaluation  Patient Details  Name: Richard Rosales MRN: [id number] Date of Birth: [birth year] Referring Provider: Wells Guiles Rosales  Encounter Date: 06/20/2015      PT End of Session - 06/20/15 1638    Visit Number 1   Number of Visits 17   Date for PT Re-Evaluation 08/19/15   Authorization Type Medicare/UHC secondary; G-code every 10th visit   PT Start Time 1107   PT Stop Time 1146   PT Time Calculation (min) 39 min   Activity Tolerance Patient tolerated treatment well   Behavior During Therapy [facility] for tasks assessed/performed      Past Medical History  Diagnosis Date  . HTN (hypertension)   . Diastolic dysfunction   . Atrial fibrillation ([place])   . GERD (gastroesophageal reflux disease)   . Allergic rhinitis   . Diverticulosis of colon     recurrent GI bleeding  . OSA (obstructive sleep apnea)     compliants w/ CPAP  . Hyperplastic colon polyp 2003  . Chronic gastritis   . Positive PPD     remote  . Atrial flutter ([place])   . Acute bronchitis   . Anemia     iron deficient  . Shortness of breath   . Venous insufficiency   . Moderate aortic insufficiency   . DJD (degenerative joint disease)   . COPD (chronic obstructive pulmonary disease) ([place])   . Hypothyroidism   . Adenomatous colon polyp   . GI bleed     Past Surgical History  Procedure Laterality Date  . Cardiac catheterization  7.27.05    revealed a preserved EF with no CAD  . Tonsillectomy    . Total knee arthroplasty      bilateral  . Nasal sinus surgery    . Colonoscopy    . Joint replacement  2001    lt total knee-rt one done 00  . Hernia repair Bilateral   . Shoulder arthroscopy    . Polypectomy      adenomatous polyps 2010  . Toe surgery Right     There were no vitals filed for this visit.  Visit Diagnosis:  Abnormality  of gait  Muscle weakness of lower extremity  Abnormality of gait due to impairment of balance      Subjective Assessment - 06/20/15 1110    Subjective Pt is an 81 year old male who presents to OP PT.  He reports pain all over body, especially in feet.  He reprots weakness in lower legs, noting trembling in legs while standing for short periods.  He notes balance difficulties, with 5 falls in the past 5 years.  He notes one fall in the past 6 months, landing on his buttocks.  He needs assistance of paramedics for getting up from the floor.  He uses RW and rollator.   Patient is accompained by: Family member  wife   Pertinent History COPD, HTN, GERD   Patient Stated Goals Pt's goal for therapy is to improve weakness and trembling as well as balance.   Currently in Pain? Yes   Pain Score 3    Pain Location --  all over in joints   Pain Orientation Right;Left   Pain Descriptors / Indicators Dull   Pain Onset More than a month ago   Pain Frequency Intermittent   Aggravating Factors  weather aggravates   Pain  Relieving Factors chair exercises alleviate            [facility] PT Assessment - 06/20/15 1117    Assessment   Medical Diagnosis Vascular Parkinson's    Referring Provider Richard Rosales   Onset Date/Surgical Date 04/16/15  MD visit   Precautions   Precautions Fall   Balance Screen   Has the patient fallen in the past 6 months Yes   How many times? 1   Has the patient had a decrease in activity level because of a fear of falling?  Yes   Is the patient reluctant to leave their home because of a fear of falling?  No   Home Social worker Private residence   Living Arrangements Spouse/significant other   Available Help at Discharge Family   Type of [place] to enter   Entrance Stairs-Number of Steps 4   Entrance Stairs-Rails Right;Left;Cannot reach both   [place] Two level;Laundry or work area in Chief of Staff to [employer] - 2 wheels;Walker - 4 wheels;Shower seat;Grab bars - toilet;Grab bars - tub/shower  chair lift for stairs to basement   Prior Function   Level of Independence Independent with basic ADLs;Independent with household mobility with device   Posture/Postural Control   Posture/Postural Control Postural limitations   Postural Limitations Rounded Shoulders;Forward head;Posterior pelvic tilt;Flexed trunk;Decreased lumbar lordosis  posterior pelvic tilt   ROM / Strength   AROM / PROM / Strength AROM;Strength   AROM   Overall AROM  Within functional limits for tasks performed   Strength   Overall Strength Deficits   Strength Assessment Site Hip;Knee;Ankle   Right/Left Hip Right;Left   Right Hip Flexion 3/5   Left Hip Flexion 3/5   Right/Left Knee Right;Left   Right Knee Flexion 3+/5   Right Knee Extension 3/5   Left Knee Flexion 3+/5   Left Knee Extension 3/5   Right/Left Ankle Right;Left   Right Ankle Dorsiflexion 3+/5   Left Ankle Dorsiflexion 3+/5   Transfers   Transfers Sit to Stand;Stand to Sit   Sit to Stand 5: Supervision;With upper extremity assist;With armrests;From chair/3-in-1   Sit to Stand Details (indicate cue type and reason) Pt requires definite use of UE for sit<>stand   Stand to Sit 5: Supervision;With upper extremity assist;With armrests;To chair/3-in-1   Ambulation/Gait   Ambulation/Gait Yes   Ambulation/Gait Assistance 5: Supervision   Ambulation/Gait Assistance Details decr foot clearance, decr. stance time LLE, fwd flexed posture   Ambulation Distance (Feet) 50 Feet   Assistive device Rolling walker   Gait Pattern Step-through pattern;Decreased step length - right;Decreased step length - left;Antalgic;Poor foot clearance - left;Poor foot clearance - right  Heavy reliance with UE support on walker   Ambulation Surface Level;Indoor   Gait velocity 24.20 sec = 1.36 ft/sec   Standardized Balance Assessment   Standardized Balance Assessment Timed  Up and Go Test;Berg Balance Test   Berg Balance Test   Sit to Stand Able to stand  independently using hands   Standing Unsupported Unable to stand 30 seconds unassisted  22.63 sec prior to needing UE support   Sitting with Back Unsupported but Feet Supported on Floor or Stool Able to sit safely and securely 2 minutes   Stand to Sit Controls descent by using hands   Transfers Needs one person to assist   Standing Unsupported with Eyes Closed Needs help to keep from falling  Standing Ubsupported with Feet Together Needs help to attain position and unable to hold for 15 seconds   From Standing, Reach Forward with Outstretched Arm Loses balance while trying/requires external support   From Standing Position, Pick up Object from Floor Unable to try/needs assist to keep balance   From Standing Position, Turn to Look Behind Over each Shoulder Needs assist to keep from losing balance and falling   Turn 360 Degrees Needs assistance while turning   Standing Unsupported, Alternately Place Feet on Step/Stool Needs assistance to keep from falling or unable to try   Standing Unsupported, One Foot in Front Loses balance while stepping or standing   Standing on One Leg Unable to try or needs assist to prevent fall   Total Score 11   Berg comment: Pt unable to stand without support >22 seconds; unable to proceed through [place]; Scores 45/56 are indicative of increased fall risk.   Timed Up and Go Test   TUG Normal TUG   Normal TUG (seconds) 29.88                             PT Short Term Goals - 06/20/15 1644    PT SHORT TERM GOAL #1   Title Pt will perform HEP with wife's supervision for improved strength, balance, gait for improved functional mobility.  TARGET 07/20/15   Time 4   Period Weeks   Status New   PT SHORT TERM GOAL #2   Title Pt will improve TUG score to less than or equal to 23 seconds for decreased fall risk.   Time 4   Period Weeks   Status New   PT SHORT TERM  GOAL #3   Title Pt will improve Berg score to at least 16/56 for decreased fall risk.   Time 4   Period Weeks   Status New   PT SHORT TERM GOAL #4   Title Pt will perform at least 6 of 10 reps of sit<>stand transfers with UE support, modified independently, for improved transfer efficiency and safety.   Time 4   Period Weeks   Status New   PT SHORT TERM GOAL #5   Title Pt will perform floor>stand transfer with UE support and min assist, for safe fall recovery.   Time 4   Period Weeks   Status New           PT Long Term Goals - 06/20/15 1649    PT LONG TERM GOAL #1   Title Pt/caregiver will verbalize understanding of fall prevention in home environment.  TARGET 08/19/15   Time 8   Period Weeks   Status New   PT LONG TERM GOAL #2   Title Pt will improve TUG score to less than or equal to 20 seconds for decreased fall risk.   Time 8   Period Weeks   Status New   PT LONG TERM GOAL #3   Title Pt will improve Berg score to at least 26/56 for decreased fall risk.   Time 8   Period Weeks   Status New   PT LONG TERM GOAL #4   Title Pt will improve gait velocity to at least 1.8 ft/sec for improved gait efficiency and safety.   Time 8   Period Weeks   Status New   PT LONG TERM GOAL #5   Title Pt will verbalize plans for continued community fitness upon D/C from PT.   Time 8  Period Weeks   Status New               Plan - 07-11-15 1639    Clinical Impression Statement Pt is an 81 year old male who presents to OP PT with vascular Parkinsonism, with c/o decreased strength, decreased balance, and 1 fall in the past 6 months.  He presents with decreased strength, decreased balance, postural abnormality, decreased gait velocity.  Pt is at fall risk per gait velocity of 1.36 ft/sec and TUG score 29.88 sec and Berg score of 11/56.  Pt's deficits make getting in and out of car difficult and long distance walking difficult.  Pt would benefit from skilled PT to address the above  stated deficits.   Pt will benefit from skilled therapeutic intervention in order to improve on the following deficits Abnormal gait;Decreased balance;Decreased mobility;Decreased strength;Difficulty walking;Postural dysfunction;Pain   Rehab Potential Good   PT Frequency 2x / week   PT Duration 8 weeks  plus eval   PT Treatment/Interventions ADLs/Self Care Home Management;Therapeutic exercise;Therapeutic activities;Functional mobility training;Gait training;Balance training;Neuromuscular re-education;Patient/family education   PT Next Visit Plan Initiate [facility]TAGO HEP for patient; sit<>stand transfer technique for improved transfers   Consulted and Agree with Plan of Care Patient;Family member/caregiver   Family Member Consulted wife          G-Codes - 07-11-15 1653    Functional Assessment Tool Used Merrilee Jansky 11/56, TUG 29.88 sec, gait velocity 1.36 ft/sec, stands unsupported < 30 seconds   Functional Limitation Mobility: Walking and moving around   Mobility: Walking and Moving Around Current Status [phone]) At least 60 percent but less than 80 percent impaired, limited or restricted   Mobility: Walking and Moving Around Goal Status [phone]) At least 40 percent but less than 60 percent impaired, limited or restricted       Problem List Patient Active Problem List   Diagnosis Date Noted  . BMI 37.0-37.9, adult 05/08/2015  . Morbid obesity (BMI 37.84) 02/19/2015  . Medicare annual wellness visit, subsequent 02/19/2015  . Hypertensive cardiovascular disease 03/06/2014  . Mixed hyperlipidemia 12/15/2013  . Prediabetes 12/15/2013  . Vitamin D deficiency 12/15/2013  . Medication management 12/15/2013  . DJD (degenerative joint disease)   . Allergic rhinitis   . Hypothyroidism   . Obesity, morbid ([place]) 06/13/2013  . Rotator cuff tear, right 05/08/2011  . COPD (chronic obstructive pulmonary disease) ([place]) 11/01/2009  . Obstructive sleep apnea 10/04/2009  . Chronic diastolic heart failure  ([place]) 10/04/2009  . Diverticulosis of colon with hemorrhage 09/10/2009  . Personal history of colonic polyps 09/10/2009  . Atrial fibrillation ([place]) 09/11/2008  . Essential hypertension 09/08/2008  . GERD 09/08/2008  . BENIGN PROSTATIC HYPERTROPHY, HX OF 09/08/2008    Mikyah Alamo W. 11-Jul-2015, 4:56 PM Frazier Butt., PT [facility] [address] [place], [place], [address] Phone: [phone]   Fax:  [phone]  Name: NASZIR RIGANO MRN: [id number] Date of Birth: 01-Mar-1931

## 2015-07-03 ENCOUNTER — Ambulatory Visit: Payer: Medicare Other | Admitting: Emergency Medicine

## 2015-07-04 ENCOUNTER — Ambulatory Visit: Payer: Medicare Other | Admitting: Physical Therapy

## 2015-07-10 ENCOUNTER — Ambulatory Visit: Payer: Medicare Other | Attending: Internal Medicine | Admitting: Physical Therapy

## 2015-07-10 DIAGNOSIS — R269 Unspecified abnormalities of gait and mobility: Secondary | ICD-10-CM | POA: Diagnosis not present

## 2015-07-10 DIAGNOSIS — R2689 Other abnormalities of gait and mobility: Secondary | ICD-10-CM

## 2015-07-10 DIAGNOSIS — M6281 Muscle weakness (generalized): Secondary | ICD-10-CM | POA: Diagnosis not present

## 2015-07-10 NOTE — Therapy (Signed)
[facility] [address] [place], [place], [address] Phone: [phone]   Fax:  [phone]  Physical Therapy Treatment  Patient Details  Name: Richard Rosales MRN: [id number] Date of Birth: [birth year] Referring Provider: Wells Guiles Tat  Encounter Date: 07/10/2015      PT End of Session - 07/10/15 1245    Visit Number 2   Number of Visits 17   Authorization Type Medicare/UHC secondary; G-code every 10th visit   PT Start Time 1130   PT Stop Time 1215   PT Time Calculation (min) 45 min   Activity Tolerance Patient tolerated treatment well   Behavior During Therapy [facility] for tasks assessed/performed      Past Medical History  Diagnosis Date  . HTN (hypertension)   . Diastolic dysfunction   . Atrial fibrillation ([place])   . GERD (gastroesophageal reflux disease)   . Allergic rhinitis   . Diverticulosis of colon     recurrent GI bleeding  . OSA (obstructive sleep apnea)     compliants w/ CPAP  . Hyperplastic colon polyp 2003  . Chronic gastritis   . Positive PPD     remote  . Atrial flutter ([place])   . Acute bronchitis   . Anemia     iron deficient  . Shortness of breath   . Venous insufficiency   . Moderate aortic insufficiency   . DJD (degenerative joint disease)   . COPD (chronic obstructive pulmonary disease) ([place])   . Hypothyroidism   . Adenomatous colon polyp   . GI bleed     Past Surgical History  Procedure Laterality Date  . Cardiac catheterization  7.27.05    revealed a preserved EF with no CAD  . Tonsillectomy    . Total knee arthroplasty      bilateral  . Nasal sinus surgery    . Colonoscopy    . Joint replacement  2001    lt total knee-rt one done 00  . Hernia repair Bilateral   . Shoulder arthroscopy    . Polypectomy      adenomatous polyps 2010  . Toe surgery Right     There were no vitals filed for this visit.  Visit Diagnosis:  Abnormality of gait  Muscle weakness of lower  extremity  Abnormality of gait due to impairment of balance      Subjective Assessment - 07/10/15 1156    Subjective He wants to have better balance and walk better; he is currenlty doing AROM in the morning that helps with stiffness and pain;  he has foot pain and gets tremors with walking a lot; he said he doens't have a lot of big expectations  but wants to see what can be done to help him feel better; his dtr can offer him massage therpay and I encouragd him to get the massage weekly if possible.    Currently in Pain? Yes   Pain Score 3    Pain Location Foot   Pain Orientation Right;Left   Pain Descriptors / Indicators Aching;Dull   Pain Type Chronic pain           Therapeutic exercise;  Recumbent eliptical rider;  Level; 1  X 5 min -instruction on reciprocal movt patterns and doing there ex within his limits considering copd Standing calf stretch ; 5 x 30 seconds bil  LSVT ex's ;  Standing  Reach back step back;  Reach forward step back;  Instruction on doing arom that encourage body and  extremity extension and rotation; as well as step reflex                    PT Education - 07/10/15 1244    Education provided Yes   Education Details calf stretching   Person(s) Educated Patient   Methods Demonstration   Comprehension Returned demonstration          PT Short Term Goals - 06/20/15 1644    PT SHORT TERM GOAL #1   Title Pt will perform HEP with wife's supervision for improved strength, balance, gait for improved functional mobility.  TARGET 07/20/15   Time 4   Period Weeks   Status New   PT SHORT TERM GOAL #2   Title Pt will improve TUG score to less than or equal to 23 seconds for decreased fall risk.   Time 4   Period Weeks   Status New   PT SHORT TERM GOAL #3   Title Pt will improve Berg score to at least 16/56 for decreased fall risk.   Time 4   Period Weeks   Status New   PT SHORT TERM GOAL #4   Title Pt will perform at least 6 of 10  reps of sit<>stand transfers with UE support, modified independently, for improved transfer efficiency and safety.   Time 4   Period Weeks   Status New   PT SHORT TERM GOAL #5   Title Pt will perform floor>stand transfer with UE support and min assist, for safe fall recovery.   Time 4   Period Weeks   Status New           PT Long Term Goals - 06/20/15 1649    PT LONG TERM GOAL #1   Title Pt/caregiver will verbalize understanding of fall prevention in home environment.  TARGET 08/19/15   Time 8   Period Weeks   Status New   PT LONG TERM GOAL #2   Title Pt will improve TUG score to less than or equal to 20 seconds for decreased fall risk.   Time 8   Period Weeks   Status New   PT LONG TERM GOAL #3   Title Pt will improve Berg score to at least 26/56 for decreased fall risk.   Time 8   Period Weeks   Status New   PT LONG TERM GOAL #4   Title Pt will improve gait velocity to at least 1.8 ft/sec for improved gait efficiency and safety.   Time 8   Period Weeks   Status New   PT LONG TERM GOAL #5   Title Pt will verbalize plans for continued community fitness upon D/C from PT.   Time 8   Period Weeks   Status New               Plan - 07/10/15 1247    Clinical Impression Statement Pt tolerated the ex's well today; his was limited to 5 min on recumbent eliptial due to fatigue;  his legs and feet felt better post tx;  Follow up with effectiviness of the HEP recommendations and proceed as tolerated ; w/ focus on AROM for pain and mvt coordiation        Problem List Patient Active Problem List   Diagnosis Date Noted  . BMI 37.0-37.9, adult 05/08/2015  . Morbid obesity (BMI 37.84) 02/19/2015  . Medicare annual wellness visit, subsequent 02/19/2015  . Hypertensive cardiovascular disease 03/06/2014  . Mixed hyperlipidemia 12/15/2013  . Prediabetes  12/15/2013  . Vitamin D deficiency 12/15/2013  . Medication management 12/15/2013  . DJD (degenerative joint disease)    . Allergic rhinitis   . Hypothyroidism   . Obesity, morbid ([place]) 06/13/2013  . Rotator cuff tear, right 05/08/2011  . COPD (chronic obstructive pulmonary disease) ([place]) 11/01/2009  . Obstructive sleep apnea 10/04/2009  . Chronic diastolic heart failure ([place]) 10/04/2009  . Diverticulosis of colon with hemorrhage 09/10/2009  . Personal history of colonic polyps 09/10/2009  . Atrial fibrillation ([place]) 09/11/2008  . Essential hypertension 09/08/2008  . GERD 09/08/2008  . BENIGN PROSTATIC HYPERTROPHY, HX OF 09/08/2008    Rosaura Carpenter D PT DPT  07/10/2015, 12:51 PM  [facility] [address] [place], [place], [address] Phone: [phone]   Fax:  [phone]  Name: Richard Rosales MRN: [id number] Date of Birth: [birth year]

## 2015-07-10 NOTE — Patient Instructions (Signed)
Pt encouraged to do the calf stretching  3 x 30 seconds 3 x day; also begin working on short bout of arom throughout his day (ie during tv commercial breaks) Discussed effective approach to ex's to avoid excessive fatigue and pain and remain safe

## 2015-07-12 ENCOUNTER — Ambulatory Visit: Payer: Medicare Other | Admitting: Physical Therapy

## 2015-07-12 DIAGNOSIS — R2689 Other abnormalities of gait and mobility: Secondary | ICD-10-CM | POA: Diagnosis not present

## 2015-07-12 DIAGNOSIS — R269 Unspecified abnormalities of gait and mobility: Secondary | ICD-10-CM

## 2015-07-12 DIAGNOSIS — M6281 Muscle weakness (generalized): Secondary | ICD-10-CM | POA: Diagnosis not present

## 2015-07-12 NOTE — Patient Instructions (Signed)
Provided OTAGO booklet with instructions where to stop exercises until able to fully review next session

## 2015-07-12 NOTE — Therapy (Signed)
[facility] [address] [place], [place], [address] Phone: [phone]   Fax:  [phone]  Physical Therapy Treatment  Patient Details  Name: Richard Rosales MRN: [id number] Date of Birth: 1931/06/01 Referring Provider: Wells Guiles Tat  Encounter Date: 07/12/2015      PT End of Session - 07/12/15 1455    Visit Number 3   Number of Visits 17   Authorization Type Medicare/UHC secondary; G-code every 10th visit   PT Start Time [id number]   PT Stop Time 1433   PT Time Calculation (min) 44 min   Activity Tolerance Patient tolerated treatment well   Behavior During Therapy [facility] for tasks assessed/performed      Past Medical History  Diagnosis Date  . HTN (hypertension)   . Diastolic dysfunction   . Atrial fibrillation ([place])   . GERD (gastroesophageal reflux disease)   . Allergic rhinitis   . Diverticulosis of colon     recurrent GI bleeding  . OSA (obstructive sleep apnea)     compliants w/ CPAP  . Hyperplastic colon polyp 2003  . Chronic gastritis   . Positive PPD     remote  . Atrial flutter ([place])   . Acute bronchitis   . Anemia     iron deficient  . Shortness of breath   . Venous insufficiency   . Moderate aortic insufficiency   . DJD (degenerative joint disease)   . COPD (chronic obstructive pulmonary disease) ([place])   . Hypothyroidism   . Adenomatous colon polyp   . GI bleed     Past Surgical History  Procedure Laterality Date  . Cardiac catheterization  7.27.05    revealed a preserved EF with no CAD  . Tonsillectomy    . Total knee arthroplasty      bilateral  . Nasal sinus surgery    . Colonoscopy    . Joint replacement  2001    lt total knee-rt one done 00  . Hernia repair Bilateral   . Shoulder arthroscopy    . Polypectomy      adenomatous polyps 2010  . Toe surgery Right     There were no vitals filed for this visit.  Visit Diagnosis:  Abnormality of gait      Subjective Assessment -  07/12/15 1354    Subjective Pt states he was sore after last session.  Wants handouts of exercises so he can do them at home.  Pt reports having to sleep in recliner at night due to reflux and back issues.   Pertinent History COPD, HTN, GERD   Patient Stated Goals Pt's goal for therapy is to improve weakness and trembling as well as balance.   Currently in Pain? No/denies  sore              Balance Exercises - 07/12/15 1458    OTAGO PROGRAM   Head Movements Sitting;5 reps   Neck Movements Sitting;5 reps   Back Extension Standing;5 reps   Trunk Movements Standing;5 reps   Ankle Movements Sitting;10 reps   Knee Extensor 10 reps   Knee Flexor 10 reps   Hip ABductor 10 reps   Ankle Plantorflexors 20 reps, support   Knee Bends 10 reps, support   Backwards Walking Support   Walking and Turning Around Assistive device   Sideways Walking Assistive device  counter for support   Tandem Stance --  unable   Tandem Walk --  unable   One Leg Stand 10 seconds,  support  facing counter for bil UE support           PT Education - 07/12/15 1455    Education provided Yes   Education Details OTAGO   Person(s) Educated Patient   Methods Explanation;Demonstration;Tactile cues;Verbal cues;Handout   Comprehension Verbalized understanding;Returned demonstration          PT Short Term Goals - 06/20/15 1644    PT SHORT TERM GOAL #1   Title Pt will perform HEP with wife's supervision for improved strength, balance, gait for improved functional mobility.  TARGET 07/20/15   Time 4   Period Weeks   Status New   PT SHORT TERM GOAL #2   Title Pt will improve TUG score to less than or equal to 23 seconds for decreased fall risk.   Time 4   Period Weeks   Status New   PT SHORT TERM GOAL #3   Title Pt will improve Berg score to at least 16/56 for decreased fall risk.   Time 4   Period Weeks   Status New   PT SHORT TERM GOAL #4   Title Pt will perform at least 6 of 10 reps of  sit<>stand transfers with UE support, modified independently, for improved transfer efficiency and safety.   Time 4   Period Weeks   Status New   PT SHORT TERM GOAL #5   Title Pt will perform floor>stand transfer with UE support and min assist, for safe fall recovery.   Time 4   Period Weeks   Status New           PT Long Term Goals - 06/20/15 1649    PT LONG TERM GOAL #1   Title Pt/caregiver will verbalize understanding of fall prevention in home environment.  TARGET 08/19/15   Time 8   Period Weeks   Status New   PT LONG TERM GOAL #2   Title Pt will improve TUG score to less than or equal to 20 seconds for decreased fall risk.   Time 8   Period Weeks   Status New   PT LONG TERM GOAL #3   Title Pt will improve Berg score to at least 26/56 for decreased fall risk.   Time 8   Period Weeks   Status New   PT LONG TERM GOAL #4   Title Pt will improve gait velocity to at least 1.8 ft/sec for improved gait efficiency and safety.   Time 8   Period Weeks   Status New   PT LONG TERM GOAL #5   Title Pt will verbalize plans for continued community fitness upon D/C from PT.   Time 8   Period Weeks   Status New               Plan - 07/12/15 1456    Clinical Impression Statement Pt needed frequent seated rest breaks today during OTAGO exercises.  Arrived late for appointment secondary to getting "lost" but PTA able to arrange schedule to see.  Continue PT per POC.   Pt will benefit from skilled therapeutic intervention in order to improve on the following deficits Abnormal gait;Decreased balance;Decreased mobility;Decreased strength;Difficulty walking;Postural dysfunction;Pain   PT Frequency 2x / week   PT Duration 8 weeks   PT Treatment/Interventions ADLs/Self Care Home Management;Therapeutic exercise;Therapeutic activities;Functional mobility training;Gait training;Balance training;Neuromuscular re-education;Patient/family education   PT Next Visit Plan Review and add to  OTAGO for HEP;wall posture exercises standing vs sitting;sit<>stand transfer technique for improved transfers  Consulted and Agree with Plan of Care Patient        Problem List Patient Active Problem List   Diagnosis Date Noted  . BMI 37.0-37.9, adult 05/08/2015  . Morbid obesity (BMI 37.84) 02/19/2015  . Medicare annual wellness visit, subsequent 02/19/2015  . Hypertensive cardiovascular disease 03/06/2014  . Mixed hyperlipidemia 12/15/2013  . Prediabetes 12/15/2013  . Vitamin D deficiency 12/15/2013  . Medication management 12/15/2013  . DJD (degenerative joint disease)   . Allergic rhinitis   . Hypothyroidism   . Obesity, morbid ([place]) 06/13/2013  . Rotator cuff tear, right 05/08/2011  . COPD (chronic obstructive pulmonary disease) ([place]) 11/01/2009  . Obstructive sleep apnea 10/04/2009  . Chronic diastolic heart failure ([place]) 10/04/2009  . Diverticulosis of colon with hemorrhage 09/10/2009  . Personal history of colonic polyps 09/10/2009  . Atrial fibrillation ([place]) 09/11/2008  . Essential hypertension 09/08/2008  . GERD 09/08/2008  . BENIGN PROSTATIC HYPERTROPHY, HX OF 09/08/2008    Narda Bonds 07/12/2015, 3:00 PM  [facility] [address] [place], [place], [address] Phone: [phone]   Fax:  [phone]  Name: SIXTO HEIGEL MRN: [id number] Date of Birth: Aug 05, 1930    Narda Bonds, [place] 07/12/2015 3:00 PM Phone: [phone] Fax: [phone]

## 2015-07-17 ENCOUNTER — Encounter: Payer: Self-pay | Admitting: Rehabilitation

## 2015-07-17 ENCOUNTER — Ambulatory Visit: Payer: Medicare Other | Admitting: Rehabilitation

## 2015-07-17 DIAGNOSIS — R269 Unspecified abnormalities of gait and mobility: Secondary | ICD-10-CM

## 2015-07-17 DIAGNOSIS — R2689 Other abnormalities of gait and mobility: Secondary | ICD-10-CM | POA: Diagnosis not present

## 2015-07-17 DIAGNOSIS — M6281 Muscle weakness (generalized): Secondary | ICD-10-CM | POA: Diagnosis not present

## 2015-07-17 NOTE — Therapy (Signed)
[facility] [address] [place], [place], [address] Phone: [phone]   Fax:  [phone]  Physical Therapy Treatment  Patient Details  Name: Richard Rosales MRN: [id number] Date of Birth: 10-08-1930 Referring Provider: Wells Guiles Tat  Encounter Date: 07/17/2015      PT End of Session - 07/17/15 1241    Visit Number 4   Number of Visits 17   Date for PT Re-Evaluation 08/19/15   Authorization Type Medicare/UHC secondary; G-code every 10th visit   PT Start Time 1020  pt late to appt   PT Stop Time 1100   PT Time Calculation (min) 40 min   Activity Tolerance Patient tolerated treatment well   Behavior During Therapy [facility] for tasks assessed/performed      Past Medical History  Diagnosis Date  . HTN (hypertension)   . Diastolic dysfunction   . Atrial fibrillation ([place])   . GERD (gastroesophageal reflux disease)   . Allergic rhinitis   . Diverticulosis of colon     recurrent GI bleeding  . OSA (obstructive sleep apnea)     compliants w/ CPAP  . Hyperplastic colon polyp 2003  . Chronic gastritis   . Positive PPD     remote  . Atrial flutter ([place])   . Acute bronchitis   . Anemia     iron deficient  . Shortness of breath   . Venous insufficiency   . Moderate aortic insufficiency   . DJD (degenerative joint disease)   . COPD (chronic obstructive pulmonary disease) ([place])   . Hypothyroidism   . Adenomatous colon polyp   . GI bleed     Past Surgical History  Procedure Laterality Date  . Cardiac catheterization  7.27.05    revealed a preserved EF with no CAD  . Tonsillectomy    . Total knee arthroplasty      bilateral  . Nasal sinus surgery    . Colonoscopy    . Joint replacement  2001    lt total knee-rt one done 00  . Hernia repair Bilateral   . Shoulder arthroscopy    . Polypectomy      adenomatous polyps 2010  . Toe surgery Right     There were no vitals filed for this visit.  Visit  Diagnosis:  Abnormality of gait  Muscle weakness of lower extremity  Abnormality of gait due to impairment of balance      Subjective Assessment - 07/17/15 1024    Subjective Pt continues to state that he is sore in his legs and shoulders from doing the exercises at home.    Pertinent History COPD, HTN, GERD   Patient Stated Goals Pt's goal for therapy is to improve weakness and trembling as well as balance.   Currently in Pain? Yes   Pain Score 4    Pain Location Leg   Pain Orientation Right;Left   Pain Descriptors / Indicators Sore   Pain Type Chronic pain   Pain Onset More than a month ago   Pain Frequency Intermittent   Aggravating Factors  exercises              Therex:  Continued with OTAGO HEP with standing exercises at counter top; heel walking down and back x 3 reps, toe walking down and back x 3 reps, tandem walking (forwards) down and back x 3 reps, sit<>stand x 10 reps with BUEs on arm rests with cues for improved posture and technique.  Ended with seated postural  PWR! Exercise x 15 reps with cues for open arms and hands with increased scapular retraction and depression.  SaO2 down to 89% following single standing exercise, but able to increase to 94% with pursed lip breathing during session.   Self Care: Discussed frequency and number of exercises that he performs at home.  Pt states that he is performing 30 each of exercise and doing between 12-14 exercises total.  Provided education to perform 10-15 reps per exercise and perform 5-7 exercises per day or break exercises up so that he does a few in the morning and a few more in the afternoon to decrease fatigue and soreness.  Pt verbalized understanding.                     PT Education - 07/17/15 1240    Education Details Continued OTAGO HEP, frequency and how many exercises to perform daily to avoid soreness   Person(s) Educated Patient   Methods Explanation   Comprehension Verbalized  understanding          PT Short Term Goals - 06/20/15 1644    PT SHORT TERM GOAL #1   Title Pt will perform HEP with wife's supervision for improved strength, balance, gait for improved functional mobility.  TARGET 07/20/15   Time 4   Period Weeks   Status New   PT SHORT TERM GOAL #2   Title Pt will improve TUG score to less than or equal to 23 seconds for decreased fall risk.   Time 4   Period Weeks   Status New   PT SHORT TERM GOAL #3   Title Pt will improve Berg score to at least 16/56 for decreased fall risk.   Time 4   Period Weeks   Status New   PT SHORT TERM GOAL #4   Title Pt will perform at least 6 of 10 reps of sit<>stand transfers with UE support, modified independently, for improved transfer efficiency and safety.   Time 4   Period Weeks   Status New   PT SHORT TERM GOAL #5   Title Pt will perform floor>stand transfer with UE support and min assist, for safe fall recovery.   Time 4   Period Weeks   Status New           PT Long Term Goals - 06/20/15 1649    PT LONG TERM GOAL #1   Title Pt/caregiver will verbalize understanding of fall prevention in home environment.  TARGET 08/19/15   Time 8   Period Weeks   Status New   PT LONG TERM GOAL #2   Title Pt will improve TUG score to less than or equal to 20 seconds for decreased fall risk.   Time 8   Period Weeks   Status New   PT LONG TERM GOAL #3   Title Pt will improve Berg score to at least 26/56 for decreased fall risk.   Time 8   Period Weeks   Status New   PT LONG TERM GOAL #4   Title Pt will improve gait velocity to at least 1.8 ft/sec for improved gait efficiency and safety.   Time 8   Period Weeks   Status New   PT LONG TERM GOAL #5   Title Pt will verbalize plans for continued community fitness upon D/C from PT.   Time 8   Period Weeks   Status New  Plan - 07/17/15 1241    Clinical Impression Statement Pt continues to demonstrate severely decreased endurance during  session and needs several rest breaks between all exercises performed at countertop.  Educated on frequence and how many exercises to perform daily as pt was performing 30 reps of all exercises.     Pt will benefit from skilled therapeutic intervention in order to improve on the following deficits Abnormal gait;Decreased balance;Decreased mobility;Decreased strength;Difficulty walking;Postural dysfunction;Pain   PT Frequency 2x / week   PT Duration 8 weeks   PT Treatment/Interventions ADLs/Self Care Home Management;Therapeutic exercise;Therapeutic activities;Functional mobility training;Gait training;Balance training;Neuromuscular re-education;Patient/family education   PT Next Visit Plan STG's?? (they are due but has only been seen 4 times) Review and add to OTAGO for HEP;wall posture exercises standing vs sitting;sit<>stand transfer technique for improved transfers   Consulted and Agree with Plan of Care Patient        Problem List Patient Active Problem List   Diagnosis Date Noted  . BMI 37.0-37.9, adult 05/08/2015  . Morbid obesity (BMI 37.84) 02/19/2015  . Medicare annual wellness visit, subsequent 02/19/2015  . Hypertensive cardiovascular disease 03/06/2014  . Mixed hyperlipidemia 12/15/2013  . Prediabetes 12/15/2013  . Vitamin D deficiency 12/15/2013  . Medication management 12/15/2013  . DJD (degenerative joint disease)   . Allergic rhinitis   . Hypothyroidism   . Obesity, morbid ([place]) 06/13/2013  . Rotator cuff tear, right 05/08/2011  . COPD (chronic obstructive pulmonary disease) ([place]) 11/01/2009  . Obstructive sleep apnea 10/04/2009  . Chronic diastolic heart failure ([place]) 10/04/2009  . Diverticulosis of colon with hemorrhage 09/10/2009  . Personal history of colonic polyps 09/10/2009  . Atrial fibrillation ([place]) 09/11/2008  . Essential hypertension 09/08/2008  . GERD 09/08/2008  . BENIGN PROSTATIC HYPERTROPHY, HX OF 09/08/2008    Cameron Sprang, PT, MPT [facility] [address] [place], [place], [address] Phone: [phone]   Fax:  [phone] 07/17/2015, 12:44 PM  Name: Richard Rosales MRN: [id number] Date of Birth: 1931/02/25

## 2015-07-19 ENCOUNTER — Ambulatory Visit: Payer: Medicare Other | Admitting: Physical Therapy

## 2015-07-22 ENCOUNTER — Telehealth: Payer: Self-pay | Admitting: Family Medicine

## 2015-07-22 MED ORDER — OSELTAMIVIR PHOSPHATE 75 MG PO CAPS: 75.0000 mg | ORAL_CAPSULE | Freq: Every day | ORAL | Status: DC

## 2015-07-22 NOTE — Telephone Encounter (Signed)
Wife tested positive for influenza A today; rx for Tamiflu provided.

## 2015-07-23 ENCOUNTER — Other Ambulatory Visit: Payer: Self-pay | Admitting: Emergency Medicine

## 2015-07-24 ENCOUNTER — Ambulatory Visit: Payer: Medicare Other | Admitting: Physical Therapy

## 2015-07-26 ENCOUNTER — Ambulatory Visit: Payer: Medicare Other | Attending: Internal Medicine | Admitting: Physical Therapy

## 2015-07-26 DIAGNOSIS — M6281 Muscle weakness (generalized): Secondary | ICD-10-CM | POA: Diagnosis not present

## 2015-07-26 DIAGNOSIS — R269 Unspecified abnormalities of gait and mobility: Secondary | ICD-10-CM | POA: Diagnosis not present

## 2015-07-26 DIAGNOSIS — R2689 Other abnormalities of gait and mobility: Secondary | ICD-10-CM

## 2015-07-26 NOTE — Therapy (Signed)
[facility] [address] [place], [place], [address] Phone: [phone]   Fax:  [phone]  Physical Therapy Treatment  Patient Details  Name: Richard Rosales MRN: [id number] Date of Birth: [birth year] Referring Provider: Wells Guiles Tat  Encounter Date: 07/26/2015      PT End of Session - 07/26/15 1152    Visit Number 5   Number of Visits 17   Date for PT Re-Evaluation 08/19/15   Authorization Type Medicare/UHC secondary; G-code every 10th visit   PT Start Time 1111  Pt arrives late   PT Stop Time 1138   PT Time Calculation (min) 27 min   Activity Tolerance Patient limited by fatigue   Behavior During Therapy [facility] for tasks assessed/performed      Past Medical History  Diagnosis Date  . HTN (hypertension)   . Diastolic dysfunction   . Atrial fibrillation ([place])   . GERD (gastroesophageal reflux disease)   . Allergic rhinitis   . Diverticulosis of colon     recurrent GI bleeding  . OSA (obstructive sleep apnea)     compliants w/ CPAP  . Hyperplastic colon polyp 2003  . Chronic gastritis   . Positive PPD     remote  . Atrial flutter ([place])   . Acute bronchitis   . Anemia     iron deficient  . Shortness of breath   . Venous insufficiency   . Moderate aortic insufficiency   . DJD (degenerative joint disease)   . COPD (chronic obstructive pulmonary disease) ([place])   . Hypothyroidism   . Adenomatous colon polyp   . GI bleed     Past Surgical History  Procedure Laterality Date  . Cardiac catheterization  7.27.05    revealed a preserved EF with no CAD  . Tonsillectomy    . Total knee arthroplasty      bilateral  . Nasal sinus surgery    . Colonoscopy    . Joint replacement  2001    lt total knee-rt one done 00  . Hernia repair Bilateral   . Shoulder arthroscopy    . Polypectomy      adenomatous polyps 2010  . Toe surgery Right     There were no vitals filed for this visit.  Visit Diagnosis:   Muscle weakness of lower extremity  Abnormality of gait due to impairment of balance      Subjective Assessment - 07/26/15 1119    Subjective Feeling sore due to arthritis, toe pain; wasn't sure if he had the flu; his wife had the flu and he has been on Tamiflu for 5 days.   Patient Stated Goals Pt's goal for therapy is to improve weakness and trembling as well as balance.   Currently in Pain? Yes   Pain Location --  all over the body-shoulder, knees, feet, neck   Pain Descriptors / Indicators Aching;Sharp  sharp with neuropathy   Pain Type Chronic pain   Pain Onset More than a month ago   Pain Frequency Intermittent   Aggravating Factors  walking aggravates   Pain Relieving Factors sititng and resting                         [facility] Adult PT Treatment/Exercise - 07/26/15 1124    Transfers   Transfers Sit to Stand;Stand to Sit   Sit to Stand 5: Supervision;With upper extremity assist;With armrests;From chair/3-in-1   Sit to Stand Details (indicate cue type and  reason) Pt requires definite need for UE support   Stand to Sit 5: Supervision;With upper extremity assist;With armrests;To chair/3-in-1   Number of Reps Other reps (comment)  6   Standardized Balance Assessment   Standardized Balance Assessment Timed Up and Go Test;Berg Balance Test   Berg Balance Test   Sit to Stand Able to stand  independently using hands   Standing Unsupported Able to stand 30 seconds unsupported   Sitting with Back Unsupported but Feet Supported on Floor or Stool Able to sit safely and securely 2 minutes   Stand to Sit Controls descent by using hands   Transfers Able to transfer with verbal cueing and /or supervision   Standing Unsupported with Eyes Closed Able to stand 3 seconds   Standing Ubsupported with Feet Together Needs help to attain position and unable to hold for 15 seconds   From Standing, Reach Forward with Outstretched Arm Loses balance while trying/requires external  support   From Standing Position, Pick up Object from Floor Unable to try/needs assist to keep balance   From Standing Position, Turn to Look Behind Over each Shoulder Needs assist to keep from losing balance and falling   Turn 360 Degrees Needs assistance while turning   Standing Unsupported, Alternately Place Feet on Step/Stool Needs assistance to keep from falling or unable to try   Standing Unsupported, One Foot in [employer] balance while stepping or standing   Standing on One Leg Unable to try or needs assist to prevent fall   Total Score 16   Timed Up and Go Test   TUG Normal TUG   Normal TUG (seconds) 35.39                PT Education - 07/26/15 1151    Education provided Yes   Education Details Discussed progress towards goals, POC, medical issues limiting therapy-suggested pt follow up with Dr. Ronnell [place]) Educated Patient   Methods Explanation   Comprehension Verbalized understanding          PT Short Term Goals - 07/26/15 1122    PT SHORT TERM GOAL #1   Title Pt will perform HEP with wife's supervision for improved strength, balance, gait for improved functional mobility.  TARGET 07/20/15   Time 4   Period Weeks   Status New   PT SHORT TERM GOAL #2   Title Pt will improve TUG score to less than or equal to 23 seconds for decreased fall risk.   Time 4   Period Weeks   Status Not Met   PT SHORT TERM GOAL #3   Title Pt will improve Berg score to at least 16/56 for decreased fall risk.   Time 4   Period Weeks   Status Achieved   PT SHORT TERM GOAL #4   Title Pt will perform at least 6 of 10 reps of sit<>stand transfers with UE support, modified independently, for improved transfer efficiency and safety.   Baseline definite use of hands   Time 4   Period Weeks   Status Partially Met   PT SHORT TERM GOAL #5   Title Pt will perform floor>stand transfer with UE support and min assist, for safe fall recovery.   Time 4   Period Weeks   Status New            PT Long Term Goals - 06/20/15 1649    PT LONG TERM GOAL #1   Title Pt/caregiver will verbalize understanding of fall prevention  in home environment.  TARGET 08/19/15   Time 8   Period Weeks   Status New   PT LONG TERM GOAL #2   Title Pt will improve TUG score to less than or equal to 20 seconds for decreased fall risk.   Time 8   Period Weeks   Status New   PT LONG TERM GOAL #3   Title Pt will improve Berg score to at least 26/56 for decreased fall risk.   Time 8   Period Weeks   Status New   PT LONG TERM GOAL #4   Title Pt will improve gait velocity to at least 1.8 ft/sec for improved gait efficiency and safety.   Time 8   Period Weeks   Status New   PT LONG TERM GOAL #5   Title Pt will verbalize plans for continued community fitness upon D/C from PT.   Time 8   Period Weeks   Status New               Plan - 07/26/15 1153    Clinical Impression Statement Pt has continued complaints of fatigue and pain which are limiting functional mobility.  Pt has met STG #3 for Berg score, but has not met STG #2 or 4.  Pt requests to stop therapy early today due to fatigue and decreased energy.  Discussed how decreased consistency of visits has limited progress with PT and consistency of therapy going forward is key.  Also discussed pt needing to follow up with doctors regarding foot/toe pain and overall decreased energy level.  Pt would benefit from further skilled PT to address functional strength, balance, and gait.   Pt will benefit from skilled therapeutic intervention in order to improve on the following deficits Abnormal gait;Decreased balance;Decreased mobility;Decreased strength;Difficulty walking;Postural dysfunction;Pain   Rehab Potential Good   PT Frequency 2x / week   PT Duration 8 weeks   PT Treatment/Interventions ADLs/Self Care Home Management;Therapeutic exercise;Therapeutic activities;Functional mobility training;Gait training;Balance training;Neuromuscular  re-education;Patient/family education   PT Next Visit Plan Check HEP and add to OTAGO if possible, sit<>stand transfer technique practice; try to add further appts to work towards [place], pt unsure if he will schedule   Consulted and Agree with Plan of Care Patient        Problem List Patient Active Problem List   Diagnosis Date Noted  . BMI 37.0-37.9, adult 05/08/2015  . Morbid obesity (BMI 37.84) 02/19/2015  . Medicare annual wellness visit, subsequent 02/19/2015  . Hypertensive cardiovascular disease 03/06/2014  . Mixed hyperlipidemia 12/15/2013  . Prediabetes 12/15/2013  . Vitamin D deficiency 12/15/2013  . Medication management 12/15/2013  . DJD (degenerative joint disease)   . Allergic rhinitis   . Hypothyroidism   . Obesity, morbid ([place]) 06/13/2013  . Rotator cuff tear, right 05/08/2011  . COPD (chronic obstructive pulmonary disease) ([place]) 11/01/2009  . Obstructive sleep apnea 10/04/2009  . Chronic diastolic heart failure ([place]) 10/04/2009  . Diverticulosis of colon with hemorrhage 09/10/2009  . Personal history of colonic polyps 09/10/2009  . Atrial fibrillation ([place]) 09/11/2008  . Essential hypertension 09/08/2008  . GERD 09/08/2008  . BENIGN PROSTATIC HYPERTROPHY, HX OF 09/08/2008    MARRIOTT,AMY W. 07/26/2015, 12:04 PM  Frazier Butt., PT  [facility] [address] [place], [place], [address] Phone: [phone]   Fax:  [phone]  Name: Richard Rosales MRN: [id number] Date of Birth: 1931/01/04

## 2015-07-31 ENCOUNTER — Ambulatory Visit: Payer: Medicare Other | Admitting: Physical Therapy

## 2015-07-31 DIAGNOSIS — M6281 Muscle weakness (generalized): Secondary | ICD-10-CM | POA: Diagnosis not present

## 2015-07-31 DIAGNOSIS — R269 Unspecified abnormalities of gait and mobility: Secondary | ICD-10-CM

## 2015-07-31 DIAGNOSIS — R2689 Other abnormalities of gait and mobility: Secondary | ICD-10-CM | POA: Diagnosis not present

## 2015-07-31 NOTE — Therapy (Signed)
[facility] [address] [place], [place], [address] Phone: [phone]   Fax:  [phone]  Physical Therapy Treatment  Patient Details  Name: Richard Rosales MRN: [id number] Date of Birth: [birth year] Referring Provider: Lurena Joiner Tat  Encounter Date: 07/31/2015      PT End of Session - 07/31/15 2132    Visit Number 6   Number of Visits 17   Date for PT Re-Evaluation 08/19/15   Authorization Type Medicare/UHC secondary; G-code every 10th visit   PT Start Time 1110  pt arrives late   PT Stop Time 1148   PT Time Calculation (min) 38 min   Activity Tolerance Patient tolerated treatment well   Behavior During Therapy [facility] for tasks assessed/performed      Past Medical History  Diagnosis Date  . HTN (hypertension)   . Diastolic dysfunction   . Atrial fibrillation ([facility])   . GERD (gastroesophageal reflux disease)   . Allergic rhinitis   . Diverticulosis of colon     recurrent GI bleeding  . OSA (obstructive sleep apnea)     compliants w/ CPAP  . Hyperplastic colon polyp 2003  . Chronic gastritis   . Positive PPD     remote  . Atrial flutter ([facility])   . Acute bronchitis   . Anemia     iron deficient  . Shortness of breath   . Venous insufficiency   . Moderate aortic insufficiency   . DJD (degenerative joint disease)   . COPD (chronic obstructive pulmonary disease) ([facility])   . Hypothyroidism   . Adenomatous colon polyp   . GI bleed     Past Surgical History  Procedure Laterality Date  . Cardiac catheterization  7.27.05    revealed a preserved EF with no CAD  . Tonsillectomy    . Total knee arthroplasty      bilateral  . Nasal sinus surgery    . Colonoscopy    . Joint replacement  2001    lt total knee-rt one done 00  . Hernia repair Bilateral   . Shoulder arthroscopy    . Polypectomy      adenomatous polyps 2010  . Toe surgery Right     There were no vitals filed for this visit.  Visit  Diagnosis:  Muscle weakness of lower extremity  Abnormality of gait      Subjective Assessment - 07/31/15 1114    Subjective Planning to go to the doctor about my toe pain.  "No matter what I do, I just feel so weak and sore."   Currently in Pain? Yes   Pain Score 6    Pain Location --  general pain in all joints   Pain Orientation Right;Left   Pain Descriptors / Indicators Aching   Pain Type Chronic pain   Pain Onset More than a month ago   Pain Frequency Intermittent   Aggravating Factors  pain all over-arthritis   Pain Relieving Factors rest                         [facility] Adult PT Treatment/Exercise - 07/31/15 1136    Self-Care   Self-Care Other Self-Care Comments   Other Self-Care Comments  Discussed fall prevention/safety in the home; provided information on [employer] for home.    Discussed also pt's POC, including decision whether to continue towards LTGs for another 2-3 weeks.  Discussed again pt's c/o fatigue and pain and recommended he follow up  with physician.          Balance Exercises - 07/31/15 1120    OTAGO PROGRAM   Head Movements Standing;5 reps   Neck Movements Standing;5 reps   Back Extension Standing;5 reps   Trunk Movements Standing;5 reps   Ankle Movements Sitting;10 reps   Knee Extensor 10 reps   Knee Flexor 5 reps   Hip ABductor 5 reps   Ankle Plantorflexors --  10 reps at counter   Knee Bends 10 reps, support   Backwards Walking Support   Walking and Turning Around Assistive device   Sideways Walking Assistive device  counter   One Leg Stand 10 seconds, support  counter           PT Education - 07/31/15 2130    Education provided Yes   Education Details Fall prevention, review of HEP; discussed POC and pt in agreement with continueing towards LTGs.   Person(s) Educated Patient   Methods Explanation   Comprehension Verbalized understanding          PT Short Term Goals - 07/31/15 1116    PT SHORT TERM GOAL #1    Title Pt will perform HEP with wife's supervision for improved strength, balance, gait for improved functional mobility.  TARGET 07/20/15   Time 4   Period Weeks   Status Achieved   PT SHORT TERM GOAL #2   Title Pt will improve TUG score to less than or equal to 23 seconds for decreased fall risk.   Time 4   Period Weeks   Status Not Met   PT SHORT TERM GOAL #3   Title Pt will improve Berg score to at least 16/56 for decreased fall risk.   Time 4   Period Weeks   Status Achieved   PT SHORT TERM GOAL #4   Title Pt will perform at least 6 of 10 reps of sit<>stand transfers with UE support, modified independently, for improved transfer efficiency and safety.   Baseline definite use of hands   Time 4   Period Weeks   Status Partially Met   PT SHORT TERM GOAL #5   Title Pt will perform floor>stand transfer with UE support and min assist, for safe fall recovery.   Baseline Pt declines today due to knee pain.   Time 4   Period Weeks   Status Deferred           PT Long Term Goals - 07/31/15 1133    PT LONG TERM GOAL #1   Title Pt/caregiver will verbalize understanding of fall prevention in home environment.  TARGET 08/19/15   Time 8   Period Weeks   Status Achieved   PT LONG TERM GOAL #2   Title Pt will improve TUG score to less than or equal to 20 seconds for decreased fall risk.   Time 8   Period Weeks   Status New   PT LONG TERM GOAL #3   Title Pt will improve Berg score to at least 26/56 for decreased fall risk.   Time 8   Period Weeks   Status New   PT LONG TERM GOAL #4   Title Pt will improve gait velocity to at least 1.8 ft/sec for improved gait efficiency and safety.   Time 8   Period Weeks   Status New   PT LONG TERM GOAL #5   Title Pt will verbalize plans for continued community fitness upon D/C from PT.   Time 8   Period Weeks  Status New               Plan - 07/31/15 2133    Clinical Impression Statement STG for floor>stand transfer deferred  at this time, as pt requests not to perform due to knee pain.  Pt has met LTG #1 for fall prevention education.  Pt performs HEP well, but continues to be frustrated by overall body pain and fatigue.  Again, receommended to pt to follow up with physician regarding these questions.  Pt is in agreement with continueing therapy per POC towards LTGS.   Pt will benefit from skilled therapeutic intervention in order to improve on the following deficits Abnormal gait;Decreased balance;Decreased mobility;Decreased strength;Difficulty walking;Postural dysfunction;Pain   Rehab Potential Good   PT Frequency 2x / week   PT Duration 8 weeks   PT Treatment/Interventions ADLs/Self Care Home Management;Therapeutic exercise;Therapeutic activities;Functional mobility training;Gait training;Balance training;Neuromuscular re-education;Patient/family education   PT Next Visit Plan Sit<>stand practice, gait activities, further progression of OTAGO if able   Consulted and Agree with Plan of Care Patient        Problem List Patient Active Problem List   Diagnosis Date Noted  . BMI 37.0-37.9, adult 05/08/2015  . Morbid obesity (BMI 37.84) 02/19/2015  . Medicare annual wellness visit, subsequent 02/19/2015  . Hypertensive cardiovascular disease 03/06/2014  . Mixed hyperlipidemia 12/15/2013  . Prediabetes 12/15/2013  . Vitamin D deficiency 12/15/2013  . Medication management 12/15/2013  . DJD (degenerative joint disease)   . Allergic rhinitis   . Hypothyroidism   . Obesity, morbid ([place]) 06/13/2013  . Rotator cuff tear, right 05/08/2011  . COPD (chronic obstructive pulmonary disease) ([place]) 11/01/2009  . Obstructive sleep apnea 10/04/2009  . Chronic diastolic heart failure ([place]) 10/04/2009  . Diverticulosis of colon with hemorrhage 09/10/2009  . Personal history of colonic polyps 09/10/2009  . Atrial fibrillation ([place]) 09/11/2008  . Essential hypertension 09/08/2008  . GERD 09/08/2008  . BENIGN PROSTATIC  HYPERTROPHY, HX OF 09/08/2008    Shaley Leavens W. 07/31/2015, 9:37 PM  Frazier Butt., PT [facility] [address] [place], [place], [address] Phone: [phone]   Fax:  [phone]  Name: Richard Rosales MRN: [id number] Date of Birth: 18-Jun-1931

## 2015-07-31 NOTE — Patient Instructions (Signed)
It is important to avoid accidents which may result in broken bones.  Here are a few ideas on how to make your home safer so you will be less likely to trip or fall.  1. Use nonskid mats or non slip strips in your shower or tub, on your bathroom floor and around sinks.  If you know that you have spilled water, wipe it up! 2. In the bathroom, it is important to have properly installed grab bars on the walls or on the edge of the tub.  Towel racks are NOT strong enough for you to hold onto or to pull on for support. 3. Stairs and hallways should have enough light.  Add lamps or night lights if you need more light. 4. It is good to have handrails on both sides of the stairs if possible.  Always fix broken handrails right away. 5. It is important to see the edges of steps.  Paint the edges of outdoor steps white so you can see them better.  Put colored tape on the edge of inside steps. 6. Throw-rugs are dangerous because they can slide.  Removing the rugs is the best idea, but if they must stay, add adhesive carpet tape to prevent slipping. 7. Do not keep things on stairs or in the halls.  Remove small furniture that blocks the halls as it may cause you to trip.  Keep telephone and electrical cords out of the way where you walk. 8. Always were sturdy, rubber-soled shoes for good support.  Never wear just socks, especially on the stairs.  Socks may cause you to slip or fall.  Do not wear full-length housecoats as you can easily trip on the bottom.  9. Place the things you use the most on the shelves that are the easiest to reach.  If you use a stepstool, make sure it is in good condition.  If you feel unsteady, DO NOT climb, ask for help. 10. If a health professional advises you to use a cane or walker, do not be ashamed.  These items can keep you from falling and breaking your bones. 

## 2015-08-01 DIAGNOSIS — Z0000 Encounter for general adult medical examination without abnormal findings: Secondary | ICD-10-CM | POA: Diagnosis not present

## 2015-08-01 DIAGNOSIS — R3915 Urgency of urination: Secondary | ICD-10-CM | POA: Diagnosis not present

## 2015-08-02 ENCOUNTER — Ambulatory Visit: Payer: Medicare Other | Admitting: Podiatry

## 2015-08-08 ENCOUNTER — Ambulatory Visit: Payer: Medicare Other | Admitting: Physical Therapy

## 2015-08-09 ENCOUNTER — Ambulatory Visit (INDEPENDENT_AMBULATORY_CARE_PROVIDER_SITE_OTHER): Payer: Medicare Other | Admitting: Podiatry

## 2015-08-09 ENCOUNTER — Encounter: Payer: Self-pay | Admitting: Podiatry

## 2015-08-09 VITALS — BP 151/73 | HR 64 | Resp 16

## 2015-08-09 DIAGNOSIS — B351 Tinea unguium: Secondary | ICD-10-CM | POA: Diagnosis not present

## 2015-08-09 DIAGNOSIS — M79673 Pain in unspecified foot: Secondary | ICD-10-CM | POA: Diagnosis not present

## 2015-08-09 DIAGNOSIS — Q828 Other specified congenital malformations of skin: Secondary | ICD-10-CM | POA: Diagnosis not present

## 2015-08-09 NOTE — Progress Notes (Signed)
Patient ID: Richard Rosales, male   DOB: February 18, 1931, 82 y.o.   MRN: [id number] Complaint:  Visit Type: Patient returns to my office for continued preventative foot care services. Complaint: Patient states" my nails have grown long and thick and become painful to walk and wear shoes".  He also has painful callus on the tip of his big toe right foot. Patient has been diagnosed with DM with no complications. He presents for preventative foot care services. No changes to ROS  Podiatric Exam: Vascular: dorsalis pedis and posterior tibial pulses are absent bilateral. Capillary return is immediate. Temperature gradient is decreased.. Skin turgor WNL  Sensorium: Normal Semmes Weinstein monofilament test. Normal tactile sensation bilaterally. Nail Exam: Pt has thick disfigured discolored nails with subungual debris noted bilateral entire nail hallux through fifth toenails Ulcer Exam: There is no evidence of ulcer or pre-ulcerative changes or infection. Orthopedic Exam: Muscle tone and strength are WNL. No limitations in general ROM. No crepitus or effusions noted. Foot type and digits show no abnormalities. Bony prominences are unremarkable. Skin:  Porokeratosis present at the distal aspect right hallux.. No infection or ulcers  Diagnosis:  Tinea unguium, Pain in right toe, pain in left toes,Porokeratosos right hallux  Treatment & Plan Procedures and Treatment: Consent by patient was obtained for treatment procedures. The patient understood the discussion of treatment and procedures well. All questions were answered thoroughly reviewed. Debridement of mycotic and hypertrophic toenails, 1 through 5 bilateral and clearing of subungual debris. No ulceration, no infection noted. Debride porokeratosis under local anesthesia. Return Visit-Office Procedure: Patient instructed to return to the office for a follow up visit 3 months for continued evaluation and treatment.  Gardiner Barefoot DPM

## 2015-08-10 ENCOUNTER — Ambulatory Visit: Payer: Medicare Other

## 2015-08-14 ENCOUNTER — Encounter: Payer: Self-pay | Admitting: Internal Medicine

## 2015-08-15 ENCOUNTER — Ambulatory Visit: Payer: Medicare Other | Admitting: Physical Therapy

## 2015-08-17 ENCOUNTER — Ambulatory Visit: Payer: Medicare Other | Admitting: Rehabilitation

## 2015-08-20 ENCOUNTER — Ambulatory Visit: Payer: Medicare Other | Admitting: Rehabilitation

## 2015-08-20 ENCOUNTER — Encounter: Payer: Self-pay | Admitting: Rehabilitation

## 2015-08-20 DIAGNOSIS — M6281 Muscle weakness (generalized): Secondary | ICD-10-CM

## 2015-08-20 DIAGNOSIS — R2689 Other abnormalities of gait and mobility: Secondary | ICD-10-CM

## 2015-08-20 DIAGNOSIS — R269 Unspecified abnormalities of gait and mobility: Secondary | ICD-10-CM | POA: Diagnosis not present

## 2015-08-21 ENCOUNTER — Ambulatory Visit (INDEPENDENT_AMBULATORY_CARE_PROVIDER_SITE_OTHER): Payer: Medicare Other | Admitting: Emergency Medicine

## 2015-08-21 ENCOUNTER — Telehealth: Payer: Self-pay | Admitting: Rehabilitation

## 2015-08-21 ENCOUNTER — Encounter: Payer: Self-pay | Admitting: Emergency Medicine

## 2015-08-21 ENCOUNTER — Telehealth: Payer: Self-pay | Admitting: Neurology

## 2015-08-21 VITALS — BP 130/78 | HR 64 | Wt 248.0 lb

## 2015-08-21 DIAGNOSIS — G4733 Obstructive sleep apnea (adult) (pediatric): Secondary | ICD-10-CM

## 2015-08-21 DIAGNOSIS — J309 Allergic rhinitis, unspecified: Secondary | ICD-10-CM | POA: Diagnosis not present

## 2015-08-21 DIAGNOSIS — J42 Unspecified chronic bronchitis: Secondary | ICD-10-CM

## 2015-08-21 MED ORDER — BUDESONIDE-FORMOTEROL FUMARATE 160-4.5 MCG/ACT IN AERO
2.0000 | INHALATION_SPRAY | Freq: Two times a day (BID) | RESPIRATORY_TRACT | Status: DC
Start: 2015-08-21 — End: 2015-12-10

## 2015-08-21 MED ORDER — ALBUTEROL SULFATE HFA 108 (90 BASE) MCG/ACT IN AERS: 2.0000 | INHALATION_SPRAY | Freq: Four times a day (QID) | RESPIRATORY_TRACT | Status: AC | PRN

## 2015-08-21 NOTE — Assessment & Plan Note (Signed)
He seems to be doing well on his current regimen. We will continue his Chlor-Trimeton and Astelin nasal spray

## 2015-08-21 NOTE — Telephone Encounter (Signed)
VM-PT's wife Izora Gala left a message stating that the physical therapist suggested he come in and see Dr Tat asap/Dawn CB# [phone]

## 2015-08-21 NOTE — Patient Instructions (Signed)
Please continue Spiriva, Symbicort Use albuterol 2 puffs as needed for shortness of breath Continue CPAP every night Continue  Astelin nasal spray and Chlor-Trimeton as you have been taking them Continue Prilosec as you have been taking it Follow with Dr Lamonte Sakai in 3 months or sooner if you have any problems.

## 2015-08-21 NOTE — Assessment & Plan Note (Signed)
Continue CPAP every night as ordered

## 2015-08-21 NOTE — Telephone Encounter (Signed)
Appt made with patient's wife.  

## 2015-08-21 NOTE — Therapy (Signed)
[facility] [address] [place], [place], [address] Phone: [phone]   Fax:  [phone]  Physical Therapy Treatment  Patient Details  Name: Richard Rosales MRN: [id number] Date of Birth: Feb 19, 1931 Referring Provider: Wells Guiles Tat  Encounter Date: 08/20/2015      PT End of Session - 08/21/15 0840    Visit Number 7   Number of Visits 25  based on new POC (4 weeks)   Date for PT Re-Evaluation 09/19/15  based on new POC.    Authorization Type Medicare/U[employer] secondary; G-code every 10th visit   PT Start Time 1455  pt late to session   PT Stop Time 1530   PT Time Calculation (min) 35 min   Activity Tolerance Patient tolerated treatment well   Behavior During Therapy WFL for tasks assessed/performed      Past Medical History  Diagnosis Date  . HTN (hypertension)   . Diastolic dysfunction   . Atrial fibrillation ([place])   . GERD (gastroesophageal reflux disease)   . Allergic rhinitis   . Diverticulosis of colon     recurrent GI bleeding  . OSA (obstructive sleep apnea)     compliants w/ CPAP  . Hyperplastic colon polyp 2003  . Chronic gastritis   . Positive PPD     remote  . Atrial flutter ([place])   . Acute bronchitis   . Anemia     iron deficient  . Shortness of breath   . Venous insufficiency   . Moderate aortic insufficiency   . DJD (degenerative joint disease)   . COPD (chronic obstructive pulmonary disease) ([place])   . Hypothyroidism   . Adenomatous colon polyp   . GI bleed     Past Surgical History  Procedure Laterality Date  . Cardiac catheterization  7.27.05    revealed a preserved EF with no CAD  . Tonsillectomy    . Total knee arthroplasty      bilateral  . Nasal sinus surgery    . Colonoscopy    . Joint replacement  2001    lt total knee-rt one done 00  . Hernia repair Bilateral   . Shoulder arthroscopy    . Polypectomy      adenomatous polyps 2010  . Toe surgery Right     There  were no vitals filed for this visit.  Visit Diagnosis:  Muscle weakness of lower extremity  Abnormality of gait  Abnormality of gait due to impairment of balance      Subjective Assessment - 08/20/15 1502    Subjective "I'm still just so weak and sore all the time.  My exercises are making me worse."    Pertinent History COPD, HTN, GERD   Patient Stated Goals Pt's goal for therapy is to improve weakness and trembling as well as balance.   Currently in Pain? Yes   Pain Score 6    Pain Location Generalized   Pain Orientation Right;Left   Pain Descriptors / Indicators Sore   Pain Type Chronic pain   Pain Onset More than a month ago   Pain Frequency Intermittent  only two occassions has pain gone away   Aggravating Factors  pain all over, soreness   Pain Relieving Factors rest, pain medication          TA:  Assessed gait speed and TUG for LTG's.  Note that both are significantly declined since time of evaluation.  TUG was 46.78 secs and gait speed is .74 ft/sec, both  highly indicative of fall risk.  Educated pt on meaning of results.    Self Care:  Discussed continued concern that pt follow up with Dr. Carles Collet regarding increasing fatigue, increasing tremors and increased soreness with all exercise.  Pt verbalized understanding.  This therapist to also send in basket via Epic to MD regarding concern.    NMR:  Began to assess BERG balance test, however due to time constraint did not get to finish, however scores seemed to be very close to baseline score, indicative of 100% fall risk.  Will continue to assess.    Plan to continue to work towards LTG's and re-cert pt for additional 4 weeks.                 [facility] Adult PT Treatment/Exercise - 08/20/15 1525    Standardized Balance Assessment   Standardized Balance Assessment Timed Up and Go Test;Berg Balance Test   Berg Balance Test   Sit to Stand Able to stand  independently using hands   Standing Unsupported Able to stand  30 seconds unsupported   Sitting with Back Unsupported but Feet Supported on Floor or Stool Able to sit safely and securely 2 minutes   Stand to Sit Controls descent by using hands   Transfers Able to transfer with verbal cueing and /or supervision   Standing Unsupported with Eyes Closed Able to stand 3 seconds   Standing Ubsupported with Feet Together Able to place feet together independently but unable to hold for 30 seconds   Timed Up and Go Test   TUG Normal TUG   Normal TUG (seconds) 46.78                PT Education - 08/21/15 0839    Education provided Yes   Education Details Continue to urge pt to follow up with Dr. Carles Collet regarding continued fatigue and soreness, goals and POC.     Person(s) Educated Patient   Methods Explanation   Comprehension Verbalized understanding          PT Short Term Goals - 07/31/15 1116    PT SHORT TERM GOAL #1   Title Pt will perform HEP with wife's supervision for improved strength, balance, gait for improved functional mobility.  TARGET 07/20/15   Time 4   Period Weeks   Status Achieved   PT SHORT TERM GOAL #2   Title Pt will improve TUG score to less than or equal to 23 seconds for decreased fall risk.   Time 4   Period Weeks   Status Not Met   PT SHORT TERM GOAL #3   Title Pt will improve Berg score to at least 16/56 for decreased fall risk.   Time 4   Period Weeks   Status Achieved   PT SHORT TERM GOAL #4   Title Pt will perform at least 6 of 10 reps of sit<>stand transfers with UE support, modified independently, for improved transfer efficiency and safety.   Baseline definite use of hands   Time 4   Period Weeks   Status Partially Met   PT SHORT TERM GOAL #5   Title Pt will perform floor>stand transfer with UE support and min assist, for safe fall recovery.   Baseline Pt declines today due to knee pain.   Time 4   Period Weeks   Status Deferred           PT Long Term Goals - 08/20/15 1505    PT LONG TERM GOAL  #1  Title Pt/caregiver will verbalize understanding of fall prevention in home environment.  Modified Target Date: 09/17/15   Baseline met 08/20/15   Time 8   Period Weeks   Status Achieved   PT LONG TERM GOAL #2   Title Pt will improve TUG score to less than or equal to 20 seconds for decreased fall risk. (Modified Target Date: 09/17/15)   Baseline 46.78 secs on 08/21/15   Time 8   Period Weeks   Status Not Met   PT LONG TERM GOAL #3   Title Pt will improve Berg score to at least 26/56 for decreased fall risk.   Baseline Did not have time to finish testing   Time 8   Period Weeks   Status Unable to assess   PT LONG TERM GOAL #4   Title Pt will improve gait velocity to at least 1.8 ft/sec for improved gait efficiency and safety.   Baseline .74 ft/sec on 08/21/15   Time 8   Period Weeks   Status Not Met   PT LONG TERM GOAL #5   Title Pt will verbalize plans for continued community fitness upon D/C from PT.   Time 8   Period Weeks   Status Not Met               Plan - 08/21/15 0845    Clinical Impression Statement Addressed LTG's for re-cert pt for continued work towards [place].  He has only met 1 of 6 LTG's during session and PT notes significant decline on gait speed and TUG from baseline scores.  Educated pt on meaning of scores and continue to urge pt to follow up with Dr. Carles Collet regarding concerns about increasing fatigue and tremors.  Pt verbalized understanding.     Pt will benefit from skilled therapeutic intervention in order to improve on the following deficits Abnormal gait;Decreased balance;Decreased mobility;Decreased strength;Difficulty walking;Postural dysfunction;Pain   Rehab Potential Good   PT Frequency 2x / week   PT Duration 4 weeks   PT Treatment/Interventions ADLs/Self Care Home Management;Therapeutic exercise;Therapeutic activities;Functional mobility training;Gait training;Balance training;Neuromuscular re-education;Patient/family education   PT Next Visit  Plan Sit<>stand practice, gait activities, further progression of OTAGO if able   Consulted and Agree with Plan of Care Patient        Problem List Patient Active Problem List   Diagnosis Date Noted  . BMI 37.0-37.9, adult 05/08/2015  . Morbid obesity (BMI 37.84) 02/19/2015  . Medicare annual wellness visit, subsequent 02/19/2015  . Hypertensive cardiovascular disease 03/06/2014  . Mixed hyperlipidemia 12/15/2013  . Prediabetes 12/15/2013  . Vitamin D deficiency 12/15/2013  . Medication management 12/15/2013  . DJD (degenerative joint disease)   . Allergic rhinitis   . Hypothyroidism   . Obesity, morbid ([place]) 06/13/2013  . Rotator cuff tear, right 05/08/2011  . COPD (chronic obstructive pulmonary disease) ([place]) 11/01/2009  . Obstructive sleep apnea 10/04/2009  . Chronic diastolic heart failure ([place]) 10/04/2009  . Diverticulosis of colon with hemorrhage 09/10/2009  . Personal history of colonic polyps 09/10/2009  . Atrial fibrillation ([place]) 09/11/2008  . Essential hypertension 09/08/2008  . GERD 09/08/2008  . BENIGN PROSTATIC HYPERTROPHY, HX OF 09/08/2008    Cameron Sprang, PT, MPT [facility] [address] [place], [place], [address] Phone: [phone]   Fax:  [phone] 08/21/2015, 8:55 AM  Name: Richard Rosales MRN: [id number] Date of Birth: Sep 26, 1930

## 2015-08-21 NOTE — Addendum Note (Signed)
Addended by: Desmond Dike C on: 08/21/2015 03:38 PM   Modules accepted: Orders

## 2015-08-21 NOTE — Assessment & Plan Note (Signed)
Severe obstructive disease with wheezing on exam but appears to be clinically stable at this time. He got through a recent viral infection without a full-fledged exacerbation. We will continue his Symbicort, Spiriva, albuterol

## 2015-08-21 NOTE — Telephone Encounter (Signed)
Spoke with patient and he states his wife is out and she keeps track of appts. She will call back to schedule. He does not know if he is still on Levodopa.

## 2015-08-21 NOTE — Telephone Encounter (Signed)
Dr. Carles Collet,   I saw Richard Rosales for outpatient PT yesterday.  I have not consistently seen him for care, however note that he continues to have increasing fatigue, weakness, and reports of increased tremors.  Note that his gait speed and Timed up and go times have gotten significantly worse from baseline, increasing fall risk.  I have recommended that he follow up with you as soon as you guys could work him in.  We have extended his POC for 4 more weeks to address deficits.    Thanks,  Richard Rosales, PT, MPT [facility] [address] [place], [place], [address] Phone: [phone]   Fax:  [phone] 08/21/2015, 9:04 AM

## 2015-08-21 NOTE — Progress Notes (Signed)
Subjective:  Patient ID: Richard Rosales, male    DOB: Dec 24, 1930, 82 y.o.   MRN: [id number]  82 yo man, former smoker (quit'80)  For FU of COPD &  OSA on CPAP 12 cm. He has  hx of HTN, A Fib, chronic diastolic dysfxn and LE edema, GERD, diverticulosis, remote positive PPD.    ROV 06/27/14 -- hx of Severe COPD with associated chronic bronchitis that frequently exacerbates. He has OSA and is compliant with CPAP. Last time he had sx consistent with acute bronchitis, was treated with pred + clarithro. He has dyspnea with activity, chores that requires him to rest. He is still coughing, gets a hoarse voice, has to clear secretions every am. He had severe mood swings with prednisone.   ROV 09/07/14 -- follow-up visit for Severe COPD with associated chronic bronchitis that frequently exacerbates. He has OSA and is compliant with CPAP. He was treated with levaquin and medrol beginning 09/05/14 - he has had more cough and sputum for the last 10-14 days. Since starting the levaquin and medrol he is doing a bit better, still with DOE and wheeze  ROV 03/01/15 -- follow-up visit for severe COPD, chronic bronchitis, obstructive sleep apnea on CPAP. He last saw TP in May.  He has oxygen that he uses with most activity, uses at home. He had a portable concentrator but didn't like it as much as the tanks. He is on symbicort and spiriva. Breathing appears to be stable at this time. Uses albuterol very rarely.   ROV 08/21/15 -- she has a history of severe COPD with chronic bronchitic phenotype. He also has OSA on CPAP. He is currently managed on Spiriva plus Symbicort. He is also using Astelin nasal spray qhs, Prilosec, Chlor-Trimeton, Humibid. He tells me that he had a recent URI 1 month ago, possibly the flu. He was treated with Tamiflu. Was not treated with pred or abx. He was told recently that he had parkinsonism, tremor. Is following w neuro. Has daily cough, secretions especially in the am. Has seen a streak of blood  before x 1.    Filed Vitals:   08/21/15 1502  BP: 130/78  Pulse: 64  Weight: 248 lb (112.492 kg)  SpO2: 96%   Gen: Pleasant, kyphotic, in no distress,  normal affect  ENT: No lesions,  mouth clear,  oropharynx clear, no postnasal drip, some nasal obstruction  Neck: No JVD, no TMG, no carotid bruits  Lungs: No use of accessory muscles, clear without rales or rhonchi, very distant exp wheeze  Cardiovascular: RRR, heart sounds normal, no murmur or gallops, 1+ peripheral edema  Musculoskeletal: No deformities, no cyanosis or clubbing  Neuro: alert, non focal  Skin: Warm, no lesions or rashes      11/18/13 --  COMPARISON: No prior CT chest. Multiple prior chest x-rays, most  recently 11/17/2013.  FINDINGS:  Contrast opacification of the pulmonary arteries is excellent. No  filling defects within either main pulmonary artery or their  branches in either lung to suggest pulmonary embolism. Heart  markedly enlarged with left ventricular predominance and evidence of  left ventricular hypertrophy. Small pericardial effusion. Moderate 3  vessel coronary atherosclerosis. Severe atherosclerosis involving  the thoracic and upper abdominal aorta and their visualized branches  without evidence of aneurysm. Tortuous thoracic and upper abdominal  aorta.  Emphysematous changes throughout both lungs. Subsegmental airspace  consolidation with air bronchograms in the deep posterior right  lower lobe. Lungs otherwise clear. No pulmonary parenchymal nodules  or  masses. No pleural effusions.  Thyroid gland enlargement with multiple nodules, the largest nodules  adjacent to one another in the right lobe measuring approximately  2.5 x 2.2 x 3.1 cm in the mid pole and 3.0 x 3.0 x 2.1 cm in the  lower pole. Scattered normal size lymph nodes in the mediastinum,  hila, and axilla without significant lymphadenopathy. Mild bilateral  gynecomastia.  Irregularity involving the contour of the  visualized liver with  relative enlargement of the left lobe. Cysts involving the mid  portions of the visualized kidneys. Moderate pancreatic atrophy.  Hiatal hernia containing predominately intra-abdominal fat.  Diverticulosis involving the visualized distal transverse and  proximal descending colon without evidence of acute diverticulitis.  Bone window images demonstrated diffuse thoracic spondylosis and  exaggeration of the usual thoracic kyphosis.  Review of the MIP images confirms the above findings.   IMPRESSION:  1. No evidence of pulmonary embolism.  2. COPD/emphysema. Atelectasis involving the right lower lobe. No  acute cardiopulmonary disease otherwise.  3. Multinodular goiter, with adjacent 3 cm nodules in the right  lobe.  4. Hepatic cirrhosis.  5. Moderate pancreatic atrophy.  6. Diverticulosis involving the visualized distal transverse and  proximal descending colon.  7. Cardiomegaly with 3 vessel coronary artery disease. Left  ventricular hypertrophy. Small pericardial effusion.  8. Mild bilateral gynecomastia   Assessment & Plan:   COPD (chronic obstructive pulmonary disease) Severe obstructive disease with wheezing on exam but appears to be clinically stable at this time. He got through a recent viral infection without a full-fledged exacerbation. We will continue his Symbicort, Spiriva, albuterol  Obstructive sleep apnea Continue CPAP every night as ordered  Allergic rhinitis He seems to be doing well on his current regimen. We will continue his Chlor-Trimeton and Astelin nasal spray     Baltazar Apo, MD, PhD 08/21/2015, 3:34 PM [facility] [phone] or if no answer [phone]

## 2015-08-22 ENCOUNTER — Ambulatory Visit: Payer: Medicare Other | Admitting: Podiatry

## 2015-08-23 ENCOUNTER — Encounter: Payer: Self-pay | Admitting: Rehabilitation

## 2015-08-23 ENCOUNTER — Ambulatory Visit: Payer: Medicare Other | Attending: Internal Medicine | Admitting: Rehabilitation

## 2015-08-23 ENCOUNTER — Telehealth: Payer: Self-pay | Admitting: Neurology

## 2015-08-23 DIAGNOSIS — R2689 Other abnormalities of gait and mobility: Secondary | ICD-10-CM | POA: Diagnosis not present

## 2015-08-23 DIAGNOSIS — M6281 Muscle weakness (generalized): Secondary | ICD-10-CM | POA: Diagnosis not present

## 2015-08-23 DIAGNOSIS — R269 Unspecified abnormalities of gait and mobility: Secondary | ICD-10-CM | POA: Diagnosis not present

## 2015-08-23 NOTE — Telephone Encounter (Signed)
Spoke with patient. He has appt in one week. He states he picked up Carbidopa Levodopa but it ended up behind something in his medicine cabinet and he never started the drug. Would you like for him to start this now or would you like him to wait and talk to you in the office in a week? Please advise.

## 2015-08-23 NOTE — Telephone Encounter (Signed)
He can wait if he wants or he can start taking it and move appt back a little ways so it gives him more time to see response to medication.  Up to him

## 2015-08-23 NOTE — Therapy (Signed)
[facility] [address] [place], [place], [address] Phone: [phone]   Fax:  [phone]  Physical Therapy Treatment  Patient Details  Name:  Richard Rosales MRN: [id number] Date of Birth: [birth year] Referring Provider: Wells Guiles Tat  Encounter Date: 08/23/2015      PT End of Session - 08/23/15 1500    Visit Number 8   Number of Visits 25  based on new POC (4 weeks)   Date for PT Re-Evaluation 09/19/15  based on new POC.    Authorization Type Medicare/UHC secondary; G-code every 10th visit   PT Start Time 1450  pt late to appt   PT Stop Time 1530   PT Time Calculation (min) 40 min   Activity Tolerance Patient tolerated treatment well   Behavior During Therapy [facility] for tasks assessed/performed      Past Medical History  Diagnosis Date  . HTN (hypertension)   . Diastolic dysfunction   . Atrial fibrillation ([place])   . GERD (gastroesophageal reflux disease)   . Allergic rhinitis   . Diverticulosis of colon     recurrent GI bleeding  . OSA (obstructive sleep apnea)     compliants w/ CPAP  . Hyperplastic colon polyp 2003  . Chronic gastritis   . Positive PPD     remote  . Atrial flutter ([place])   . Acute bronchitis   . Anemia     iron deficient  . Shortness of breath   . Venous insufficiency   . Moderate aortic insufficiency   . DJD (degenerative joint disease)   . COPD (chronic obstructive pulmonary disease) ([place])   . Hypothyroidism   . Adenomatous colon polyp   . GI bleed     Past Surgical History  Procedure Laterality Date  . Cardiac catheterization  7.27.05    revealed a preserved EF with no CAD  . Tonsillectomy    . Total knee arthroplasty      bilateral  . Nasal sinus surgery    . Colonoscopy    . Joint replacement  2001    lt total knee-rt one done 00  . Hernia repair Bilateral   . Shoulder arthroscopy    . Polypectomy      adenomatous polyps 2010  . Toe surgery Right     There were  no vitals filed for this visit.  Visit Diagnosis:  Abnormality of gait  Muscle weakness of lower extremity  Abnormality of gait due to impairment of balance      Subjective Assessment - 08/23/15 1459    Subjective "I'm sore" "I have an appointment with Dr Tat next week but I had to cancel our appt bc I can't do both."    Patient is accompained by: Family member   Pertinent History COPD, HTN, GERD   Patient Stated Goals Pt's goal for therapy is to improve weakness and trembling as well as balance.   Pain Score 4    Pain Location Generalized   Pain Orientation --  all over   Pain Descriptors / Indicators Aching   Pain Type Chronic pain   Pain Onset More than a month ago   Pain Frequency Intermittent   Aggravating Factors  pain all over, soreness   Pain Relieving Factors rest, pain medication               NMR:  Worked on modified PWR! Moves during session in order to work on upright posture and larger step lengths to carry over to  gait.  While standing at counter top, performed side stepping while reaching upward to further enforce posture with cues for pushing back to midline.  Performed x 10 reps, progressing to side stepping pointing foot outwards and moving arms to side (wide fingers) x 10 reps total, again with cues for posture and utilizing outward LE to "push" himself back to midline.  Performed anterior/posterior weight shifting from front LE to back LE (raising to toes and rocking back to heel) x 7 reps with alternating LE placed forward.  Continued cues for breathing and upright head posture (to tolerance) during task.  Transitioned to working on turning with wider step width.  Performed 90 deg to 180 deg turns with two steps only (and UE support) x 5 reps in each direction.  Pt completed this task very well and was able to demonstrate increased step width throughout.  Ended session with counter top balance task walking tandem forwards/backwards x 3 laps total.  Continues  to require several seated rest breaks following each task due to fatigue and SOB.                     PT Education - 08/23/15 1657    Education provided Yes   Education Details Adding visits to POC.    Person(s) Educated Patient   Methods Explanation   Comprehension Verbalized understanding          PT Short Term Goals - 07/31/15 1116    PT SHORT TERM GOAL #1   Title Pt will perform HEP with wife's supervision for improved strength, balance, gait for improved functional mobility.  TARGET 07/20/15   Time 4   Period Weeks   Status Achieved   PT SHORT TERM GOAL #2   Title Pt will improve TUG score to less than or equal to 23 seconds for decreased fall risk.   Time 4   Period Weeks   Status Not Met   PT SHORT TERM GOAL #3   Title Pt will improve Berg score to at least 16/56 for decreased fall risk.   Time 4   Period Weeks   Status Achieved   PT SHORT TERM GOAL #4   Title Pt will perform at least 6 of 10 reps of sit<>stand transfers with UE support, modified independently, for improved transfer efficiency and safety.   Baseline definite use of hands   Time 4   Period Weeks   Status Partially Met   PT SHORT TERM GOAL #5   Title Pt will perform floor>stand transfer with UE support and min assist, for safe fall recovery.   Baseline Pt declines today due to knee pain.   Time 4   Period Weeks   Status Deferred           PT Long Term Goals - 08/20/15 1505    PT LONG TERM GOAL #1   Title Pt/caregiver will verbalize understanding of fall prevention in home environment.  Modified Target Date: 09/17/15   Baseline met 08/20/15   Time 8   Period Weeks   Status Achieved   PT LONG TERM GOAL #2   Title Pt will improve TUG score to less than or equal to 20 seconds for decreased fall risk. (Modified Target Date: 09/17/15)   Baseline 46.78 secs on 08/21/15   Time 8   Period Weeks   Status Not Met   PT LONG TERM GOAL #3   Title Pt will improve Berg score to at least  26/56 for  decreased fall risk.   Baseline Did not have time to finish testing   Time 8   Period Weeks   Status Unable to assess   PT LONG TERM GOAL #4   Title Pt will improve gait velocity to at least 1.8 ft/sec for improved gait efficiency and safety.   Baseline .74 ft/sec on 08/21/15   Time 8   Period Weeks   Status Not Met   PT LONG TERM GOAL #5   Title Pt will verbalize plans for continued community fitness upon D/C from PT.   Time 8   Period Weeks   Status Not Met               Plan - 08/23/15 1500    Clinical Impression Statement Skilled session focused on modified PWR! moves to encourage upright posture and increased step lengths to carry over to safer and more efficient gait pattern.  Continues to require increased seated rest breaks due to fatigue during session.    Pt will benefit from skilled therapeutic intervention in order to improve on the following deficits Abnormal gait;Decreased balance;Decreased mobility;Decreased strength;Difficulty walking;Postural dysfunction;Pain   Rehab Potential Good   PT Frequency 2x / week   PT Duration 4 weeks   PT Treatment/Interventions ADLs/Self Care Home Management;Therapeutic exercise;Therapeutic activities;Functional mobility training;Gait training;Balance training;Neuromuscular re-education;Patient/family education   PT Next Visit Plan Sit<>stand practice, gait activities, balance, further progression of OTAGO if able   Consulted and Agree with Plan of Care Patient        Problem List Patient Active Problem List   Diagnosis Date Noted  . BMI 37.0-37.9, adult 05/08/2015  . Morbid obesity (BMI 37.84) 02/19/2015  . Medicare annual wellness visit, subsequent 02/19/2015  . Hypertensive cardiovascular disease 03/06/2014  . Mixed hyperlipidemia 12/15/2013  . Prediabetes 12/15/2013  . Vitamin D deficiency 12/15/2013  . Medication management 12/15/2013  . DJD (degenerative joint disease)   . Allergic rhinitis   .  Hypothyroidism   . Obesity, morbid ([place]) 06/13/2013  . Rotator cuff tear, right 05/08/2011  . COPD (chronic obstructive pulmonary disease) ([place]) 11/01/2009  . Obstructive sleep apnea 10/04/2009  . Chronic diastolic heart failure ([place]) 10/04/2009  . Diverticulosis of colon with hemorrhage 09/10/2009  . Personal history of colonic polyps 09/10/2009  . Atrial fibrillation ([place]) 09/11/2008  . Essential hypertension 09/08/2008  . GERD 09/08/2008  . BENIGN PROSTATIC HYPERTROPHY, HX OF 09/08/2008   Cameron Sprang, PT, MPT [facility] [address] [place], [place], [address] Phone: [phone]   Fax:  [phone] 08/23/2015, 5:02 PM  Name: Richard Rosales MRN: [id number] Date of Birth: 1930-10-10

## 2015-08-23 NOTE — Telephone Encounter (Signed)
Patient made aware of choices. He wants to wait to start medication and keep appt next week.

## 2015-08-27 ENCOUNTER — Encounter: Payer: Self-pay | Admitting: Physical Therapy

## 2015-08-27 ENCOUNTER — Ambulatory Visit: Payer: Medicare Other | Admitting: Physical Therapy

## 2015-08-27 VITALS — HR 67

## 2015-08-27 DIAGNOSIS — R269 Unspecified abnormalities of gait and mobility: Secondary | ICD-10-CM | POA: Diagnosis not present

## 2015-08-27 DIAGNOSIS — M6281 Muscle weakness (generalized): Secondary | ICD-10-CM

## 2015-08-27 DIAGNOSIS — R2689 Other abnormalities of gait and mobility: Secondary | ICD-10-CM | POA: Diagnosis not present

## 2015-08-27 NOTE — Therapy (Signed)
[facility] [address] [place], [place], [address] Phone: [phone]   Fax:  [phone]  Physical Therapy Treatment  Patient Details  Name: Richard Rosales MRN: [id number] Date of Birth: 1931/03/09 Referring Provider: Wells Guiles Tat  Encounter Date: 08/27/2015      PT End of Session - 08/27/15 1259    Visit Number 9   Number of Visits 25  based on new POC (4 weeks)   Date for PT Re-Evaluation 09/19/15  based on new POC.    Authorization Type Medicare/UHC secondary; G-code every 10th visit   PT Start Time 1153   PT Stop Time 1230   PT Time Calculation (min) 37 min   Equipment Utilized During Treatment Gait belt   Activity Tolerance Patient limited by fatigue   Behavior During Therapy WFL for tasks assessed/performed      Past Medical History  Diagnosis Date  . HTN (hypertension)   . Diastolic dysfunction   . Atrial fibrillation ([place])   . GERD (gastroesophageal reflux disease)   . Allergic rhinitis   . Diverticulosis of colon     recurrent GI bleeding  . OSA (obstructive sleep apnea)     compliants w/ CPAP  . Hyperplastic colon polyp 2003  . Chronic gastritis   . Positive PPD     remote  . Atrial flutter ([place])   . Acute bronchitis   . Anemia     iron deficient  . Shortness of breath   . Venous insufficiency   . Moderate aortic insufficiency   . DJD (degenerative joint disease)   . COPD (chronic obstructive pulmonary disease) ([place])   . Hypothyroidism   . Adenomatous colon polyp   . GI bleed     Past Surgical History  Procedure Laterality Date  . Cardiac catheterization  7.27.05    revealed a preserved EF with no CAD  . Tonsillectomy    . Total knee arthroplasty      bilateral  . Nasal sinus surgery    . Colonoscopy    . Joint replacement  2001    lt total knee-rt one done 00  . Hernia repair Bilateral   . Shoulder arthroscopy    . Polypectomy      adenomatous polyps 2010  . Toe surgery  Right     Filed Vitals:   08/27/15 1156                  With activity 08/27/15 1212               After rest  Pulse: 62 67  SpO2: 88% 94%    Visit Diagnosis:  Muscle weakness of lower extremity  Abnormality of gait due to impairment of balance      Subjective Assessment - 08/27/15 1156    Subjective Soreness is staying about the same. "I'm slowing down."   Patient is accompained by: Family member   Pertinent History COPD, HTN, GERD   Patient Stated Goals Pt's goal for therapy is to improve weakness and trembling as well as balance.   Currently in Pain? Yes   Pain Score 4    Pain Location Generalized   Pain Orientation Other (Comment)  no value   Pain Descriptors / Indicators Aching   Pain Type Chronic pain   Pain Onset More than a month ago   Aggravating Factors  activity   Pain Relieving Factors rest   Effect of Pain on Daily Activities has to sit down  during daily activity   Multiple Pain Sites Yes   Pain Score 7   Pain Location Knee   Pain Orientation Right   Pain Descriptors / Indicators Sharp   Pain Type Acute pain   Pain Frequency Intermittent   Aggravating Factors  walk   Pain Relieving Factors sitting                         [facility] Adult PT Treatment/Exercise - 08/27/15 0001    Knee/Hip Exercises: Aerobic   Other Aerobic SCI FIT seat10 L= 1, 5 minx2 with rest break  cues for breathing technique             Balance Exercises - 08/27/15 1225    Balance Exercises: Standing   Standing Eyes Opened Wide (BOA)  wt shifts without UE support side-to-side, fw/bw, Min guard           PT Education - 08/27/15 1258    Education provided Yes   Education Details benefit of aerobic activity.   Person(s) Educated Patient   Methods Explanation   Comprehension Verbalized understanding          PT Short Term Goals - 07/31/15 1116    PT SHORT TERM GOAL #1   Title Pt will perform HEP with wife's supervision for improved strength, balance,  gait for improved functional mobility.  TARGET 07/20/15   Time 4   Period Weeks   Status Achieved   PT SHORT TERM GOAL #2   Title Pt will improve TUG score to less than or equal to 23 seconds for decreased fall risk.   Time 4   Period Weeks   Status Not Met   PT SHORT TERM GOAL #3   Title Pt will improve Berg score to at least 16/56 for decreased fall risk.   Time 4   Period Weeks   Status Achieved   PT SHORT TERM GOAL #4   Title Pt will perform at least 6 of 10 reps of sit<>stand transfers with UE support, modified independently, for improved transfer efficiency and safety.   Baseline definite use of hands   Time 4   Period Weeks   Status Partially Met   PT SHORT TERM GOAL #5   Title Pt will perform floor>stand transfer with UE support and min assist, for safe fall recovery.   Baseline Pt declines today due to knee pain.   Time 4   Period Weeks   Status Deferred           PT Long Term Goals - 08/27/15 1305    PT LONG TERM GOAL #1   Title Pt/caregiver will verbalize understanding of fall prevention in home environment.  Modified Target Date: 09/17/15   Baseline met 08/20/15   Time 8   Period Weeks   Status Achieved   PT LONG TERM GOAL #2   Title Pt will improve TUG score to less than or equal to 20 seconds for decreased fall risk. (Modified Target Date: 09/17/15)   Baseline 46.78 secs on 08/21/15   Time 8   Period Weeks   Status On-going   PT LONG TERM GOAL #3   Title Pt will improve Berg score to at least 26/56 for decreased fall risk.   Baseline Did not have time to finish testing   Time 8   Period Weeks   Status On-going   PT LONG TERM GOAL #4   Title Pt will improve gait velocity to at least  1.8 ft/sec for improved gait efficiency and safety.   Baseline .74 ft/sec on 08/21/15   Time 8   Period Weeks   Status On-going   PT LONG TERM GOAL #5   Title Pt will verbalize plans for continued community fitness upon D/C from PT.   Time 8   Period Weeks   Status  On-going               Plan - 08/27/15 1300    Clinical Impression Statement Skilled session focused on increasing activity tolerance with standing balance (wt shifts without UE support)  and aerobic activity on Sci fit.  Pt fatigues quickly needing multiple rest breaks throughout session.   Pt will benefit from skilled therapeutic intervention in order to improve on the following deficits Abnormal gait;Decreased balance;Decreased mobility;Decreased strength;Difficulty walking;Postural dysfunction;Pain   Rehab Potential Good   PT Frequency 2x / week   PT Duration 4 weeks   PT Treatment/Interventions ADLs/Self Care Home Management;Therapeutic exercise;Therapeutic activities;Functional mobility training;Gait training;Balance training;Neuromuscular re-education;Patient/family education   PT Next Visit Plan G-code, Sit<>stand practice, gait activities, balance, further progression of OTAGO if able   Consulted and Agree with Plan of Care Patient        Problem List Patient Active Problem List   Diagnosis Date Noted  . BMI 37.0-37.9, adult 05/08/2015  . Morbid obesity (BMI 37.84) 02/19/2015  . Medicare annual wellness visit, subsequent 02/19/2015  . Hypertensive cardiovascular disease 03/06/2014  . Mixed hyperlipidemia 12/15/2013  . Prediabetes 12/15/2013  . Vitamin D deficiency 12/15/2013  . Medication management 12/15/2013  . DJD (degenerative joint disease)   . Allergic rhinitis   . Hypothyroidism   . Obesity, morbid ([place]) 06/13/2013  . Rotator cuff tear, right 05/08/2011  . COPD (chronic obstructive pulmonary disease) ([place]) 11/01/2009  . Obstructive sleep apnea 10/04/2009  . Chronic diastolic heart failure ([place]) 10/04/2009  . Diverticulosis of colon with hemorrhage 09/10/2009  . Personal history of colonic polyps 09/10/2009  . Atrial fibrillation ([place]) 09/11/2008  . Essential hypertension 09/08/2008  . GERD 09/08/2008  . BENIGN PROSTATIC HYPERTROPHY, HX OF 09/08/2008     Bjorn Loser, PTA  08/27/2015, 1:09 PM [facility] [address] [place], [place], [address] Phone: [phone]   Fax:  [phone]  Name: LEAH SKORA MRN: [id number] Date of Birth: [birth year]

## 2015-08-28 ENCOUNTER — Encounter: Payer: Self-pay | Admitting: Internal Medicine

## 2015-08-28 ENCOUNTER — Ambulatory Visit (INDEPENDENT_AMBULATORY_CARE_PROVIDER_SITE_OTHER): Payer: Medicare Other | Admitting: Internal Medicine

## 2015-08-28 VITALS — BP 126/62 | HR 64 | Temp 97.5°F | Resp 18 | Ht 67.0 in | Wt 248.4 lb

## 2015-08-28 DIAGNOSIS — Z125 Encounter for screening for malignant neoplasm of prostate: Secondary | ICD-10-CM

## 2015-08-28 DIAGNOSIS — E782 Mixed hyperlipidemia: Secondary | ICD-10-CM

## 2015-08-28 DIAGNOSIS — I4891 Unspecified atrial fibrillation: Secondary | ICD-10-CM | POA: Diagnosis not present

## 2015-08-28 DIAGNOSIS — Z79899 Other long term (current) drug therapy: Secondary | ICD-10-CM

## 2015-08-28 DIAGNOSIS — J449 Chronic obstructive pulmonary disease, unspecified: Secondary | ICD-10-CM

## 2015-08-28 DIAGNOSIS — K219 Gastro-esophageal reflux disease without esophagitis: Secondary | ICD-10-CM | POA: Diagnosis not present

## 2015-08-28 DIAGNOSIS — N320 Bladder-neck obstruction: Secondary | ICD-10-CM

## 2015-08-28 DIAGNOSIS — G4733 Obstructive sleep apnea (adult) (pediatric): Secondary | ICD-10-CM | POA: Diagnosis not present

## 2015-08-28 DIAGNOSIS — E039 Hypothyroidism, unspecified: Secondary | ICD-10-CM

## 2015-08-28 DIAGNOSIS — R7303 Prediabetes: Secondary | ICD-10-CM

## 2015-08-28 DIAGNOSIS — E559 Vitamin D deficiency, unspecified: Secondary | ICD-10-CM | POA: Diagnosis not present

## 2015-08-28 DIAGNOSIS — R7309 Other abnormal glucose: Secondary | ICD-10-CM

## 2015-08-28 DIAGNOSIS — I10 Essential (primary) hypertension: Secondary | ICD-10-CM | POA: Diagnosis not present

## 2015-08-28 DIAGNOSIS — Z1212 Encounter for screening for malignant neoplasm of rectum: Secondary | ICD-10-CM

## 2015-08-28 LAB — HEPATIC FUNCTION PANEL
ALBUMIN: 4 g/dL (ref 3.6–5.1)
ALK PHOS: 95 U/L (ref 40–115)
ALT: 15 U/L (ref 9–46)
AST: 22 U/L (ref 10–35)
BILIRUBIN INDIRECT: 0.6 mg/dL (ref 0.2–1.2)
Bilirubin, Direct: 0.2 mg/dL (ref <=0.2)
TOTAL PROTEIN: 6.6 g/dL (ref 6.1–8.1)
Total Bilirubin: 0.8 mg/dL (ref 0.2–1.2)

## 2015-08-28 LAB — CBC WITH DIFFERENTIAL/PLATELET
BASOS ABS: 0.1 10*3/uL (ref 0.0–0.1)
Basophils Relative: 1 % (ref 0–1)
EOS ABS: 0.6 10*3/uL (ref 0.0–0.7)
EOS PCT: 8 % — AB (ref 0–5)
HEMATOCRIT: 42.9 % (ref 39.0–52.0)
Hemoglobin: 14 g/dL (ref 13.0–17.0)
LYMPHS ABS: 1.6 10*3/uL (ref 0.7–4.0)
LYMPHS PCT: 22 % (ref 12–46)
MCH: 30.2 pg (ref 26.0–34.0)
MCHC: 32.6 g/dL (ref 30.0–36.0)
MCV: 92.5 fL (ref 78.0–100.0)
MONOS PCT: 9 % (ref 3–12)
MPV: 10.1 fL (ref 8.6–12.4)
Monocytes Absolute: 0.6 10*3/uL (ref 0.1–1.0)
Neutro Abs: 4.3 10*3/uL (ref 1.7–7.7)
Neutrophils Relative %: 60 % (ref 43–77)
Platelets: 227 10*3/uL (ref 150–400)
RBC: 4.64 MIL/uL (ref 4.22–5.81)
RDW: 12.9 % (ref 11.5–15.5)
WBC: 7.2 10*3/uL (ref 4.0–10.5)

## 2015-08-28 LAB — TSH: TSH: 2.91 m[IU]/L (ref 0.40–4.50)

## 2015-08-28 LAB — LIPID PANEL
CHOL/HDL RATIO: 2.5 ratio (ref <=5.0)
Cholesterol: 134 mg/dL (ref 125–200)
HDL: 54 mg/dL (ref >=40)
LDL Cholesterol: 68 mg/dL (ref <130)
Triglycerides: 61 mg/dL (ref <150)
VLDL: 12 mg/dL (ref <30)

## 2015-08-28 LAB — BASIC METABOLIC PANEL WITH GFR
BUN: 13 mg/dL (ref 7–25)
CALCIUM: 9.3 mg/dL (ref 8.6–10.3)
CO2: 29 mmol/L (ref 20–31)
Chloride: 98 mmol/L (ref 98–110)
Creat: 0.73 mg/dL (ref 0.70–1.11)
GFR, Est Non African American: 86 mL/min (ref >=60)
GLUCOSE: 100 mg/dL — AB (ref 65–99)
POTASSIUM: 4.3 mmol/L (ref 3.5–5.3)
Sodium: 137 mmol/L (ref 135–146)

## 2015-08-28 LAB — MAGNESIUM: Magnesium: 2 mg/dL (ref 1.5–2.5)

## 2015-08-28 NOTE — Progress Notes (Signed)
Patient ID: Richard Rosales, male   DOB: Sep 23, 1930, 82 y.o.   MRN: [id number]  Comprehensive Evaluation & Examination  This very nice 82 y.o. 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  Patient has been followed for HTN, pAfib, COPD, Morbid Obesity, COPD, OSA, Prediabetes, Hyperlipidemia and Vitamin D Deficiency. Other limiting factors are generalized DJD with c/o multiple joint aches. He does have a gait instability and uses a cane for stability.   HTN predates since since 1998. Patient's BP has been controlled at home.Today's BP: 126/62 mmHg. Patient has pAfib, but disavows Coumadin after a severe diverticular bleed. Patient denies any cardiac symptoms as chest pain, palpitations,dizziness or ankle swelling. He does have severe DOE due to his COPD and chronic Diastolic Heart failure. Patient is followed by Dr Richard Rosales for his COPD. He also has been seen recently by Dr Richard Rosales for reduction of a paraphimosis.    Patient's hyperlipidemia is controlled with diet and medications. Patient denies myalgias or other medication SE's. Last lipids were at goal with Cholesterol 149; HDL 49; LDL 76; Triglycerides 118 on 02/19/2015.    Patient has hx/o Morbid Obesity (BMI 38.90) and consequent prediabetes since Dec 2012 with A1c 6.4%  and patient denies reactive hypoglycemic symptoms, visual blurring, diabetic polys or paresthesias. Last A1c was 5.7% on 02/19/2015.   Finally, patient has history of Vitamin D Deficiency of "18" in 2008 and last vitamin D was 44 on 02/19/2015.    Medication Sig  . TYLENOL #3  Take 1 tablet by mouth every 4 (four) hours as needed for moderate pain.   Marland Kitchen albuterol  HFA inhaler Inhale 2 puffs into the lungs every 6 (six) hours as needed for wheezing or shortness of breath.  Marland Kitchen albuterol  0.083% neb  soln Take 3 mLs (2.5 mg total) by nebulization every 4 (four) hours.  Marland Kitchen amoxicillin  500 MG  capsule as directed. (DENTAL)  . aspirin 81 MG tablet Take 1 tablet 3 days per week  . ASTELIN  nasal spray Place 2 sprays into the nose at bedtime.   Marland Kitchen b complex vitamins tablet Take 1 tablet by mouth every morning.   . SYMBICORT 160-4.5  inhaler Inhale 2 puffs into the lungs 2 (two) times daily.  . chlorpheniramine  4 MG tablet Take 1 tablet by mouth in the morning and 1 tablet at bedtime  . VITAMIN D 2000 UNITS  Take 1 capsule by mouth daily.   Marland Kitchen diltiazem  XR 240 MG 24 hr  Take 1 capsule (240 mg total) by mouth 2 (two) times daily.  Marland Kitchen COLACE 100 MG capsule Take 100 mg by mouth 2 (two) times daily.    . fish oil-omega-3  1000 MG  Take 1 g by mouth 2 (two) times daily.    . Furosemide 40 MG tablet Take 1 tablet (40 mg total) by mouth daily.  . Glucosamine HCl 1000 MG TABS Take 1 tablet by mouth daily.    Marland Kitchen glucosamine-chondroitin  Take 1 tablet by mouth 1 day or 1 dose.  Marland Kitchen guaifenesin  400 MG TABS Take 400 mg by mouth 2 (two) times daily.    Richard Rosales 5-325 MG per tablet Take 1 tablet by mouth every 6 (six) hours as needed for moderate pain (Only takes during CT/MRI testing).   Marland Kitchen levothyroxine  100 MCG tablet TAKE 1 TABLET DAILY BEFORE BREAKFAST  . losartan  100 MG tablet TAKE  1 TABLET DAILY  . omeprazole  40 MG capsule TAKE 1 CAPSULE TWICE A DAY  . PROVIGIL 200 MG tablet Take 1/2 tablet by mouth on Wed and Sun when needed (when pt drives)  . SPIRIVA HANDIHALER  INHALE THE CONTENTS OF 1 CAPSULE ONCE DAILY   No current facility-administered medications on file prior to visit.   Allergies  Allergen Reactions  . Ace Inhibitors Cough  . Beta Adrenergic Blockers Other (See Comments)    bradycardia  . Decadron [Dexamethasone]   . Fluticasone-Salmeterol     Unable to remember per pt  . Hytrin [Terazosin]     Nasal congestion   . Prednisone     Shakes / tremors  . Vioxx [Rofecoxib] Other (See Comments)    dyspepsia   Past Medical History  Diagnosis Date  . HTN (hypertension)   .  Diastolic dysfunction   . Atrial fibrillation ([place])   . GERD (gastroesophageal reflux disease)   . Allergic rhinitis   . Diverticulosis of colon     recurrent GI bleeding  . OSA (obstructive sleep apnea)     compliants w/ CPAP  . Hyperplastic colon polyp 2003  . Chronic gastritis   . Positive PPD     remote  . Atrial flutter ([place])   . Acute bronchitis   . Anemia     iron deficient  . Shortness of breath   . Venous insufficiency   . Moderate aortic insufficiency   . DJD (degenerative joint disease)   . COPD (chronic obstructive pulmonary disease) ([place])   . Hypothyroidism   . Adenomatous colon polyp   . GI bleed    Health Maintenance  Topic Date Due  . PNA vac Low Risk Adult (2 of 2 - PCV13) 05/29/2011  . INFLUENZA VACCINE  01/22/2016  . TETANUS/TDAP  07/26/2024  . ZOSTAVAX  Completed   Immunization History  Administered Date(s) Administered  . DT 07/26/2014  . DTaP 06/24/2003  . Influenza Split 04/24/2011, 03/23/2012, 03/10/2013  . Influenza Whole 03/23/2009, 03/23/2010  . Influenza, High Dose Seasonal PF 04/13/2014  . Influenza,inj,Quad PF,36+ Mos 03/01/2015  . Pneumococcal Polysaccharide-23 05/23/1994, 11/22/2001, 05/28/2010  . Tdap 06/24/2003  . Zoster 09/14/2013   Past Surgical History  Procedure Laterality Date  . Cardiac catheterization  7.27.05    revealed a preserved EF with no CAD  . Tonsillectomy    . Total knee arthroplasty      bilateral  . Nasal sinus surgery    . Colonoscopy    . Joint replacement  2001    lt total knee-rt one done 00  . Hernia repair Bilateral   . Shoulder arthroscopy    . Polypectomy      adenomatous polyps 2010  . Toe surgery Right    Family History  Problem Relation Age of Onset  . Hypertension Mother   . Diabetes Father   . Lung cancer Brother     chemical   Social History   Social History  . Marital Status: Married    Spouse Name: N/A  . Number of Children: 4  . Years of Education: N/A   Occupational  History  . part-time as a Theme park manager at [employer]  . Smoking status: Former Smoker -- 1.00 packs/day for 20 years    Types: Cigarettes    Quit date: 08/01/1978  . Smokeless tobacco: Never Used  . Alcohol Use: 0.0 oz/week    0 Standard drinks or equivalent  per week     Comment: small glass of wine daily  . Drug Use: No  . 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    ROS Constitutional: Denies fever, chills, weight loss/gain, headaches, insomnia,  night sweats or change in appetite. Does c/o fatigue. Eyes: Denies redness, blurred vision, diplopia, discharge, itchy or watery eyes.  ENT: Denies discharge, congestion, post nasal drip, epistaxis, sore throat, earache, hearing loss, dental pain, Tinnitus, Vertigo, Sinus pain or snoring.  Cardio: Denies chest pain, palpitations, irregular heartbeat, syncope, dyspnea, diaphoresis, orthopnea, PND, claudication or edema Respiratory: denies cough, dyspnea, DOE, pleurisy, hoarseness, laryngitis or wheezing.  Gastrointestinal: Denies dysphagia, heartburn, reflux, water brash, pain, cramps, nausea, vomiting, bloating, diarrhea, constipation, hematemesis, melena, hematochezia, jaundice or hemorrhoids Genitourinary: Denies dysuria, frequency, urgency, nocturia, hesitancy, discharge, hematuria or flank pain Musculoskeletal: Denies arthralgia, myalgia, stiffness, Jt. Swelling, pain, limp or strain/sprain. Denies Falls. Skin: Denies puritis, rash, hives, warts, acne, eczema or change in skin lesion Neuro: No weakness, tremor, incoordination, spasms, paresthesia or pain Psychiatric: Denies confusion, memory loss or sensory loss. Denies Depression. Endocrine: Denies change in weight, skin, hair change, nocturia, and paresthesia, diabetic polys, visual blurring or hyper /  hypo glycemic episodes.  Heme/Lymph: No excessive bleeding, bruising or enlarged lymph nodes.  Physical Exam  BP 126/62 mmHg  Pulse 64  Temp(Src) 97.5 F (36.4 C)  Resp 18  Ht 5\' 7"  (1.702 m)  Wt 248 lb 6.4 oz (112.674 kg)  BMI 38.90 kg/m2  General Appearance: Well nourished, in no apparent distress. Eyes: PERRLA, EOMs, conjunctiva no swelling or erythema, normal fundi and vessels. Sinuses: No frontal/maxillary tenderness ENT/Mouth: EACs patent / TMs  nl. Nares clear without erythema, swelling, mucoid exudates. Oral hygiene is good. No erythema, swelling, or exudate. Tongue normal, non-obstructing. Tonsils not swollen or erythematous. Hearing normal.  Neck: Supple, thyroid normal. No bruits, nodes or JVD. Respiratory: Respiratory effort normal.  BS equal and clear bilateral without rales, rhonci, wheezing or stridor. Cardio: Heart sounds are normal with regular rate and rhythm and no murmurs, rubs or gallops. Peripheral pulses are normal and equal bilaterally without edema. No aortic or femoral bruits. Chest: symmetric with normal excursions and percussion.  Abdomen: Soft, with Nl bowel sounds. Nontender, no guarding, rebound, hernias, masses, or organomegaly.  Lymphatics: Non tender without lymphadenopathy.  Genitourinary: No hernias.Testes nl. DRE - prostate nl for age - smooth & firm w/o nodules. Musculoskeletal: Full ROM all peripheral extremities, joint stability, 5/5 strength, and normal gait. Skin: Warm and dry without rashes, lesions, cyanosis, clubbing or  ecchymosis.  Neuro: Cranial nerves intact, reflexes equal bilaterally. Normal muscle tone, no cerebellar symptoms. Sensation intact.  Pysch: Alert and oriented X 3 with normal affect, insight and judgment appropriate.   Assessment and Plan  1. Essential hypertension  - Microalbumin / creatinine urine ratio - EKG 12-Lead - [place], RETROPERITNL ABD,  LTD - TSH  2. Mixed hyperlipidemia  - Lipid panel - TSH  3.  Prediabetes  - Hemoglobin A1c - Insulin, random  4. Vitamin D deficiency  - VITAMIN D 25 Hydroxy   5. Morbid obesity, unspecified obesity type ([place])   6. Atrial fibrillation, unspecified type ([place])   7. Obstructive sleep apnea   8. Chronic obstructive pulmonary disease ([place])   9. Hypothyroidism  - TSH  10. Gastroesophageal reflux disease,   11.  Screening for rectal cancer  - POC Hemoccult Bld/Stl   12. Prostate cancer screening  - PSA  13. Medication management  - Urinalysis, Routine w reflex microscopic  - CBC with Differential/Platelet - BASIC METABOLIC PANEL WITH GFR - Hepatic function panel - Magnesium  14. Bladder neck obstruction  - PSA  15. Other abnormal glucose  - Hemoglobin A1c - Insulin, random   Continue prudent diet as discussed, weight control, BP monitoring, regular exercise, and medications as discussed.  Discussed med effects and SE's. Routine screening labs and tests as requested with regular follow-up as recommended. Over 40 minutes of exam, counseling, chart review and high complex critical decision making was performed

## 2015-08-28 NOTE — Patient Instructions (Signed)

## 2015-08-29 ENCOUNTER — Ambulatory Visit: Payer: Medicare Other | Admitting: Physical Therapy

## 2015-08-29 ENCOUNTER — Other Ambulatory Visit: Payer: Self-pay | Admitting: Internal Medicine

## 2015-08-29 DIAGNOSIS — I10 Essential (primary) hypertension: Secondary | ICD-10-CM | POA: Diagnosis not present

## 2015-08-29 DIAGNOSIS — Z79899 Other long term (current) drug therapy: Secondary | ICD-10-CM | POA: Diagnosis not present

## 2015-08-29 DIAGNOSIS — N390 Urinary tract infection, site not specified: Secondary | ICD-10-CM | POA: Diagnosis not present

## 2015-08-29 LAB — PSA: PSA: 2.52 ng/mL (ref <=4.00)

## 2015-08-29 LAB — HEMOGLOBIN A1C
HEMOGLOBIN A1C: 5.8 % — AB (ref <5.7)
Mean Plasma Glucose: 120 mg/dL — ABNORMAL HIGH (ref <117)

## 2015-08-29 LAB — VITAMIN D 25 HYDROXY (VIT D DEFICIENCY, FRACTURES): VIT D 25 HYDROXY: 45 ng/mL (ref 30–100)

## 2015-08-29 LAB — INSULIN, RANDOM: INSULIN: 18.8 u[IU]/mL (ref 2.0–19.6)

## 2015-08-30 ENCOUNTER — Ambulatory Visit (INDEPENDENT_AMBULATORY_CARE_PROVIDER_SITE_OTHER): Payer: Medicare Other | Admitting: Neurology

## 2015-08-30 ENCOUNTER — Encounter: Payer: Self-pay | Admitting: Neurology

## 2015-08-30 ENCOUNTER — Ambulatory Visit: Payer: Medicare Other | Admitting: Rehabilitation

## 2015-08-30 VITALS — BP 122/68 | HR 74 | Ht 67.0 in | Wt 244.0 lb

## 2015-08-30 DIAGNOSIS — G609 Hereditary and idiopathic neuropathy, unspecified: Secondary | ICD-10-CM

## 2015-08-30 DIAGNOSIS — G214 Vascular parkinsonism: Secondary | ICD-10-CM

## 2015-08-30 LAB — URINALYSIS, ROUTINE W REFLEX MICROSCOPIC
Bilirubin Urine: NEGATIVE
Glucose, UA: NEGATIVE
Hgb urine dipstick: NEGATIVE
Ketones, ur: NEGATIVE
Nitrite: NEGATIVE
Specific Gravity, Urine: 1.03 (ref 1.001–1.035)
pH: 5.5 (ref 5.0–8.0)

## 2015-08-30 LAB — URINALYSIS, MICROSCOPIC ONLY
Bacteria, UA: NONE SEEN [HPF]
Casts: NONE SEEN [LPF]
RBC / HPF: NONE SEEN RBC/HPF (ref <=2)
YEAST: NONE SEEN [HPF]

## 2015-08-30 LAB — MICROALBUMIN / CREATININE URINE RATIO
Creatinine, Urine: 175 mg/dL (ref 20–370)
Microalb Creat Ratio: 17 mcg/mg creat (ref <30)
Microalb, Ur: 3 mg/dL

## 2015-08-30 NOTE — Patient Instructions (Signed)
Start carbidopa/levodopa 25/100, 1/2 tab three times a day before meals x 1 wk, then 1/2 in am & noon & 1 in evening for a week, then 1/2 in am &1 at noon &one in evening for a week, then 1 tablet three times a day before meals (do this about 30 min before meals).

## 2015-08-30 NOTE — Progress Notes (Signed)
Richard Rosales was seen today in the movement disorders clinic for neurologic consultation at the request of Richard Doe DAVID, MD.  This patient is accompanied in the office by his spouse who supplements the history.  The consultation is for the evaluation of tremor and weakness and to r/o PD.  The records that were made available to me were reviewed.   Pt states that sx's have been going on for 5 years but worse over the last few years.  States that he has fallen 5 times in the last 5 years, but 2 were since this past summer.  Has trouble getting up once he falls.  No fractures.  Didn't have walker when fell.  Mother had hx of PD  08/30/15 update:  The patient returns today for follow-up, accompanied by his wife who supplements the history.  He has a history of gait instability and falls, which are likely multifactorial.  He does have evidence of peripheral neuropathy.  Last visit, I thought he could have minor vascular parkinsonism and he wanted to try levodopa, which was given to him in October, 2016.  He was also referred for physical therapy.  He did attend some limited physical therapy sessions, but the efficacy of this was limited by his stamina and fatigue, and I also like a correspondence from his physical therapist that he was not progressing because of these things and continued to have tremor and gait instability.  When I did not know at the time is that he ever tried levodopa.  He does state that the did therapy with a diff provider last week and she used recumbant bike and he felt great when he got off of that and now wants to buy one for his home.  He did call me on March 2 to state that he never started the medication and wanted to know if he should follow-up.  It was my recommendation that he likely should try the medication and he could push his follow-up visit back a little bit, but ultimately he decided to go ahead and keep this visit.  Neuroimaging has  previously  been performed.  Had CT brain in 10/2013 and evidence of chronic small vessel disease.   ALLERGIES:   Allergies  Allergen Reactions  . Ace Inhibitors Cough  . Beta Adrenergic Blockers Other (See Comments)    bradycardia  . Decadron [Dexamethasone]   . Fluticasone-Salmeterol     Unable to remember per pt  . Hytrin [Terazosin]     Nasal congestion   . Prednisone     Shakes / tremors  . Vioxx [Rofecoxib] Other (See Comments)    dyspepsia    CURRENT MEDICATIONS:  Outpatient Encounter Prescriptions as of 08/30/2015  Medication Sig  . acetaminophen-codeine (TYLENOL #3) 300-30 MG per tablet Take 1 tablet by mouth every 4 (four) hours as needed for moderate pain.   Marland Kitchen albuterol (PROVENTIL HFA;VENTOLIN HFA) 108 (90 Base) MCG/ACT inhaler Inhale 2 puffs into the lungs every 6 (six) hours as needed for wheezing or shortness of breath.  Marland Kitchen albuterol (PROVENTIL) (2.5 MG/3ML) 0.083% nebulizer solution Take 3 mLs (2.5 mg total) by nebulization every 4 (four) hours.  Marland Kitchen amoxicillin (AMOXIL) 500 MG capsule as directed. (DENTAL)  . aspirin 81 MG tablet Take 1 tablet 3 days per week  . azelastine (ASTELIN) 137 MCG/SPRAY nasal spray Place 2 sprays into the nose at bedtime.   Marland Kitchen b complex vitamins tablet Take 1 tablet by mouth every morning.   Marland Kitchen  budesonide-formoterol (SYMBICORT) 160-4.5 MCG/ACT inhaler Inhale 2 puffs into the lungs 2 (two) times daily.  . chlorpheniramine (CHLOR-TRIMETON) 4 MG tablet Take 1 tablet by mouth in the morning and 1 tablet at bedtime  . Cholecalciferol (VITAMIN D) 2000 UNITS CAPS Take 1 capsule by mouth daily.   Marland Kitchen diltiazem (DILACOR XR) 240 MG 24 hr capsule Take 1 capsule (240 mg total) by mouth 2 (two) times daily.  Marland Kitchen docusate sodium (COLACE) 100 MG capsule Take 100 mg by mouth 2 (two) times daily.    . fish oil-omega-3 fatty acids 1000 MG capsule Take 1 g by mouth 2 (two) times daily.    . furosemide (LASIX) 40 MG tablet Take 1 tablet (40 mg total) by mouth daily.  Marland Kitchen  glucosamine-chondroitin 500-400 MG tablet Take 1 tablet by mouth 1 day or 1 dose.  Marland Kitchen guaifenesin (HUMIBID E) 400 MG TABS Take 400 mg by mouth 2 (two) times daily.    Marland Kitchen HYDROcodone-acetaminophen (NORCO/VICODIN) 5-325 MG per tablet Take 1 tablet by mouth every 6 (six) hours as needed for moderate pain (Only takes during CT/MRI testing).   Marland Kitchen levothyroxine (SYNTHROID, LEVOTHROID) 100 MCG tablet TAKE 1 TABLET DAILY BEFORE BREAKFAST  . losartan (COZAAR) 100 MG tablet TAKE 1 TABLET DAILY  . omeprazole (PRILOSEC) 40 MG capsule TAKE 1 CAPSULE TWICE A DAY  . PROVIGIL 200 MG tablet Take 1/2 tablet by mouth on Wed and Sun when needed (when pt drives)  . SPIRIVA HANDIHALER 18 MCG inhalation capsule INHALE THE CONTENTS OF 1 CAPSULE ONCE DAILY  . [DISCONTINUED] Glucosamine HCl 1000 MG TABS Take 1 tablet by mouth daily.     No facility-administered encounter medications on file as of 08/30/2015.    PAST MEDICAL HISTORY:   Past Medical History  Diagnosis Date  . HTN (hypertension)   . Diastolic dysfunction   . Atrial fibrillation ([place])   . GERD (gastroesophageal reflux disease)   . Allergic rhinitis   . Diverticulosis of colon     recurrent GI bleeding  . OSA (obstructive sleep apnea)     compliants w/ CPAP  . Hyperplastic colon polyp 2003  . Chronic gastritis   . Positive PPD     remote  . Atrial flutter ([place])   . Acute bronchitis   . Anemia     iron deficient  . Shortness of breath   . Venous insufficiency   . Moderate aortic insufficiency   . DJD (degenerative joint disease)   . COPD (chronic obstructive pulmonary disease) ([place])   . Hypothyroidism   . Adenomatous colon polyp   . GI bleed     PAST SURGICAL HISTORY:   Past Surgical History  Procedure Laterality Date  . Cardiac catheterization  7.27.05    revealed a preserved EF with no CAD  . Tonsillectomy    . Total knee arthroplasty      bilateral  . Nasal sinus surgery    . Colonoscopy    . Joint replacement  2001    lt total  knee-rt one done 00  . Hernia repair Bilateral   . Shoulder arthroscopy    . Polypectomy      adenomatous polyps 2010  . Toe surgery Right     SOCIAL HISTORY:   Social History   Social History  . Marital Status: Married    Spouse Name: N/A  . Number of Children: 4  . Years of Education: N/A   Occupational History  . part-time as a Theme park manager at [employer]    Social History Main Topics  . Smoking status: Former Smoker -- 1.00 packs/day for 20 years    Types: Cigarettes    Quit date: 08/01/1978  . Smokeless tobacco: Never Used  . Alcohol Use: 0.0 oz/week    0 Standard drinks or equivalent per week     Comment: small glass of wine daily  . Drug Use: No  . Sexual Activity: Not on file   Other Topics Concern  . 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    FAMILY HISTORY:   Family Status  Relation Status Death Age  . Mother Deceased     HTN, PD  . Father Deceased     DM, heart failure  . Brother Deceased 40    lung CA (worked with chemicals)  . Sister Deceased 12    sudden cardiac disease  . Child Alive     4, one with degenerative disease    ROS:   A complete 10 system review of systems was obtained and was unremarkable apart from what is mentioned above.  PHYSICAL EXAMINATION:    VITALS:   Filed Vitals:   08/30/15 1131  BP: 122/68  Pulse: 74  Height: 5\' 7"  (1.702 m)  Weight: 244 lb (110.678 kg)    GEN:  The patient appears stated age and is in NAD. HEENT:  Normocephalic, atraumatic.  The mucous membranes are moist. The superficial temporal arteries are without ropiness or tenderness. CV:  RRR Lungs:  There are diffuse posterior exp wheezes Neck/HEME:  There are no carotid bruits bilaterally.  Cardiac murmur radiates to the bilateral carotids.  Neck is held flexed with the chin on the chest  Neurological  examination:  Orientation: The patient is alert and oriented x3. Cranial nerves: There is good facial symmetry. . The speech is fluent and clear.  The patient is able to make the gutteral sounds without difficulty. Soft palate rises symmetrically and there is no tongue deviation. Hearing is intact to conversational tone. Sensation: Sensation is intact to light touch Motor: Strength is 5/5 in the bilateral upper and lower extremities.   Shoulder shrug is equal and symmetric.  There is no pronator drift.   Movement examination: Tone: There is normal tone in the bilateral upper extremities.  The tone in the lower extremities is normal.  Abnormal movements: none, even with distraction Coordination:  There is no decremation with RAM's, with any form of RAMS, including alternating supination and pronation of the forearm, hand opening and closing, finger taps, heel taps and toe taps. Gait and Station: The patient has  difficulty arising out of a deep-seated chair without the use of the hands. The patient's stride length is decreased with antalgic gait.  He is better with his walker     Labs:  Lab Results  Component Value Date   HGBA1C 5.8* 08/28/2015   Lab Results  Component Value Date   VITAMINB12 707 02/05/2015   Lab Results  Component Value Date   TSH 2.91 08/28/2015     Chemistry      Component Value Date/Time   NA 137 08/28/2015 1202   K 4.3 08/28/2015 1202   CL 98 08/28/2015 1202   CO2 29 08/28/2015 1202   BUN 13 08/28/2015 1202   CREATININE 0.73 08/28/2015 1202   CREATININE 0.63 11/11/2013 0957  Component Value Date/Time   CALCIUM 9.3 08/28/2015 1202   ALKPHOS 95 08/28/2015 1202   AST 22 08/28/2015 1202   ALT 15 08/28/2015 1202   BILITOT 0.8 08/28/2015 1202     Lab Results  Component Value Date   FOLATE >20.0 04/16/2015   ASSESSMENT/PLAN:  1.  Gait instability  -think multifactorial.  Is some evidence of peripheral neuropathy which I think could be  contributing.  W/U was negative for reversible causes.  I think that orthopedic issues (? Knee) could also play issue.    -I told the patient again that I do not see evidence of idiopathic Parkinson's disease.  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  Discussed with him that patients with vascular parkinsonism on a responding to levodopa about 30% of the time.  He commits to trying this time.  -agree with walker at all times. 2.  Will determine f/u based on response to levodopa.  Pt preferred to hold on making appt for now.  Much greater than 50% of this visit was spent in counseling with the patient and the family.  Total face to face time:  25 min

## 2015-09-01 ENCOUNTER — Other Ambulatory Visit: Payer: Self-pay | Admitting: Internal Medicine

## 2015-09-01 DIAGNOSIS — N390 Urinary tract infection, site not specified: Secondary | ICD-10-CM

## 2015-09-01 LAB — URINE CULTURE: Colony Count: >=100000

## 2015-09-01 MED ORDER — CIPROFLOXACIN HCL 500 MG PO TABS: ORAL_TABLET | ORAL | Status: AC

## 2015-09-01 MED ORDER — CIPROFLOXACIN HCL 500 MG PO TABS: ORAL_TABLET | ORAL | Status: DC

## 2015-09-03 ENCOUNTER — Ambulatory Visit: Payer: Medicare Other | Admitting: Physical Therapy

## 2015-09-04 ENCOUNTER — Telehealth: Payer: Self-pay | Admitting: Neurology

## 2015-09-04 MED ORDER — CARBIDOPA-LEVODOPA ER 25-100 MG PO TBCR
EXTENDED_RELEASE_TABLET | ORAL | Status: DC
Start: 2015-09-04 — End: 2015-12-18

## 2015-09-04 NOTE — Telephone Encounter (Signed)
Why don't you give him CR version to try and see if he can tolerate it any better

## 2015-09-04 NOTE — Telephone Encounter (Signed)
PT called and needs a call back in regards to his medication, he states it is urgent/Dawn CB# [phone]

## 2015-09-04 NOTE — Telephone Encounter (Signed)
Patient made aware he can stop medication. He will stay off of it tomorrow while he has to drive and start Carbidopa Levodopa CR 25/100 when he is able to stay home to see how his body tolerates it. He will start 1 tablet daily for one week, then 1 tablet BID for one week, then 1 tablet TID after. RX sent to pharmacy. He will let me know how he feels on medication and call with any problems/questions.

## 2015-09-04 NOTE — Telephone Encounter (Signed)
Spoke with patient and he states that he has taken Carbidopa Levodopa for two days and is experiencing extreme fatigue and weakness. He has appts tomorrow and states he does not trust himself to drive while taking the medication. No one else drives in his family. Please advise.

## 2015-09-05 ENCOUNTER — Other Ambulatory Visit: Payer: Self-pay | Admitting: *Deleted

## 2015-09-05 DIAGNOSIS — H02834 Dermatochalasis of left upper eyelid: Secondary | ICD-10-CM | POA: Diagnosis not present

## 2015-09-05 DIAGNOSIS — H02831 Dermatochalasis of right upper eyelid: Secondary | ICD-10-CM | POA: Diagnosis not present

## 2015-09-05 DIAGNOSIS — Z1212 Encounter for screening for malignant neoplasm of rectum: Secondary | ICD-10-CM

## 2015-09-05 DIAGNOSIS — H04123 Dry eye syndrome of bilateral lacrimal glands: Secondary | ICD-10-CM | POA: Diagnosis not present

## 2015-09-05 DIAGNOSIS — H2513 Age-related nuclear cataract, bilateral: Secondary | ICD-10-CM | POA: Diagnosis not present

## 2015-09-05 DIAGNOSIS — H43393 Other vitreous opacities, bilateral: Secondary | ICD-10-CM | POA: Diagnosis not present

## 2015-09-05 LAB — POC HEMOCCULT BLD/STL (HOME/3-CARD/SCREEN)
Card #3 Fecal Occult Blood, POC: NEGATIVE
FECAL OCCULT BLD: NEGATIVE
FECAL OCCULT BLD: NEGATIVE

## 2015-09-06 ENCOUNTER — Ambulatory Visit: Payer: Medicare Other | Admitting: Physical Therapy

## 2015-09-06 ENCOUNTER — Encounter: Payer: Self-pay | Admitting: Physical Therapy

## 2015-09-06 DIAGNOSIS — R269 Unspecified abnormalities of gait and mobility: Secondary | ICD-10-CM | POA: Diagnosis not present

## 2015-09-06 DIAGNOSIS — R2689 Other abnormalities of gait and mobility: Secondary | ICD-10-CM | POA: Diagnosis not present

## 2015-09-06 DIAGNOSIS — M6281 Muscle weakness (generalized): Secondary | ICD-10-CM

## 2015-09-06 NOTE — Therapy (Signed)
[facility] [address] [place], [place], [address] Phone: [phone]   Fax:  [phone]  Physical Therapy Treatment  Patient Details  Name: DEWARD SEBEK MRN: [id number] Date of Birth: [birth year] Referring Provider: Wells Guiles Tat  Encounter Date: 09/06/2015      PT End of Session - 09/06/15 1548    Visit Number 10   Number of Visits 25  based on new POC (4 weeks)   Date for PT Re-Evaluation 09/19/15  based on new POC.    Authorization Type Medicare/UHC secondary; G-code every 10th visit   PT Start Time 1407   PT Stop Time 1445   PT Time Calculation (min) 38 min   Equipment Utilized During Treatment Gait belt   Activity Tolerance Patient limited by fatigue   Behavior During Therapy WFL for tasks assessed/performed      Past Medical History  Diagnosis Date  . HTN (hypertension)   . Diastolic dysfunction   . Atrial fibrillation ([place])   . GERD (gastroesophageal reflux disease)   . Allergic rhinitis   . Diverticulosis of colon     recurrent GI bleeding  . OSA (obstructive sleep apnea)     compliants w/ CPAP  . Hyperplastic colon polyp 2003  . Chronic gastritis   . Positive PPD     remote  . Atrial flutter ([place])   . Acute bronchitis   . Anemia     iron deficient  . Shortness of breath   . Venous insufficiency   . Moderate aortic insufficiency   . DJD (degenerative joint disease)   . COPD (chronic obstructive pulmonary disease) ([place])   . Hypothyroidism   . Adenomatous colon polyp   . GI bleed     Past Surgical History  Procedure Laterality Date  . Cardiac catheterization  7.27.05    revealed a preserved EF with no CAD  . Tonsillectomy    . Total knee arthroplasty      bilateral  . Nasal sinus surgery    . Colonoscopy    . Joint replacement  2001    lt total knee-rt one done 00  . Hernia repair Bilateral   . Shoulder arthroscopy    . Polypectomy      adenomatous polyps 2010  . Toe  surgery Right     There were no vitals filed for this visit.  Visit Diagnosis:  Muscle weakness of lower extremity  Abnormality of gait due to impairment of balance  Abnormality of gait      Subjective Assessment - 09/06/15 1413    Subjective " I felt so good." After going on the "Bike".   Patient is accompained by: Family member   Pertinent History COPD, HTN, GERD   Patient Stated Goals Pt's goal for therapy is to improve weakness and trembling as well as balance.   Currently in Pain? Yes   Pain Score 4    Pain Location Generalized   Pain Descriptors / Indicators Aching   Pain Type Chronic pain   Pain Onset More than a month ago   Pain Frequency Intermittent                         [facility] Adult PT Treatment/Exercise - 09/06/15 0001    Ambulation/Gait   Ambulation/Gait Yes   Ambulation/Gait Assistance 5: Supervision   Assistive device Rolling walker   Gait Pattern Step-through pattern;Decreased step length - right;Decreased step length - left;Antalgic;Poor foot clearance -  left;Poor foot clearance - right   Ambulation Surface Level   Gait velocity 26.80 sec = 1.55f/sec   Berg Balance Test   Sit to Stand Able to stand  independently using hands   Standing Unsupported Able to stand safely 2 minutes   Sitting with Back Unsupported but Feet Supported on Floor or Stool Able to sit safely and securely 2 minutes   Stand to Sit Controls descent by using hands   Transfers Able to transfer safely, minor use of hands   Standing Unsupported with Eyes Closed Able to stand 10 seconds with supervision   Standing Ubsupported with Feet Together Needs help to attain position but able to stand for 30 seconds with feet together   From Standing, Reach Forward with Outstretched Arm Reaches forward but needs supervision   From Standing Position, Pick up Object from Floor Unable to pick up shoe, but reaches 2-5 cm (1-2") from shoe and balances independently   From Standing  Position, Turn to Look Behind Over each Shoulder Turn sideways only but maintains balance   Turn 360 Degrees Able to turn 360 degrees safely but slowly   Standing Unsupported, Alternately Place Feet on Step/Stool Needs assistance to keep from falling or unable to try   Standing Unsupported, One Foot in Front Able to take small step independently and hold 30 seconds   Standing on One Leg Unable to try or needs assist to prevent fall   Total Score 31   Timed Up and Go Test   TUG Normal TUG   Normal TUG (seconds) 21.7                PT Education - 09/06/15 1547    Education provided Yes   Education Details discussed goals checked.   Person(s) Educated Patient   Methods Explanation   Comprehension Verbalized understanding          PT Short Term Goals - 07/31/15 1116    PT SHORT TERM GOAL #1   Title Pt will perform HEP with wife's supervision for improved strength, balance, gait for improved functional mobility.  TARGET 07/20/15   Time 4   Period Weeks   Status Achieved   PT SHORT TERM GOAL #2   Title Pt will improve TUG score to less than or equal to 23 seconds for decreased fall risk.   Time 4   Period Weeks   Status Not Met   PT SHORT TERM GOAL #3   Title Pt will improve Berg score to at least 16/56 for decreased fall risk.   Time 4   Period Weeks   Status Achieved   PT SHORT TERM GOAL #4   Title Pt will perform at least 6 of 10 reps of sit<>stand transfers with UE support, modified independently, for improved transfer efficiency and safety.   Baseline definite use of hands   Time 4   Period Weeks   Status Partially Met   PT SHORT TERM GOAL #5   Title Pt will perform floor>stand transfer with UE support and min assist, for safe fall recovery.   Baseline Pt declines today due to knee pain.   Time 4   Period Weeks   Status Deferred           PT Long Term Goals - 08/27/15 1305    PT LONG TERM GOAL #1   Title Pt/caregiver will verbalize understanding of  fall prevention in home environment.  Modified Target Date: 09/17/15   Baseline met 08/20/15  Time 8   Period Weeks   Status Achieved   PT LONG TERM GOAL #2   Title Pt will improve TUG score to less than or equal to 20 seconds for decreased fall risk. (Modified Target Date: 09/17/15)   Baseline 46.78 secs on 08/21/15   Time 8   Period Weeks   Status On-going   PT LONG TERM GOAL #3   Title Pt will improve Berg score to at least 26/56 for decreased fall risk.   Baseline Did not have time to finish testing   Time 8   Period Weeks   Status On-going   PT LONG TERM GOAL #4   Title Pt will improve gait velocity to at least 1.8 ft/sec for improved gait efficiency and safety.   Baseline .74 ft/sec on 08/21/15   Time 8   Period Weeks   Status On-going   PT LONG TERM GOAL #5   Title Pt will verbalize plans for continued community fitness upon D/C from PT.   Time 8   Period Weeks   Status On-going               Plan - 09/06/15 1549    Clinical Impression Statement Skilled session focused on testing for G-code, discussing results and POC.  Pt continues to be at a high fall risk per balance and gait tests but has made signifacnt progress on BERG and TUG and has had no falls recently since starting PT.   Pt will benefit from skilled therapeutic intervention in order to improve on the following deficits Abnormal gait;Decreased balance;Decreased mobility;Decreased strength;Difficulty walking;Postural dysfunction;Pain   Rehab Potential Good   PT Frequency 2x / week   PT Duration 4 weeks   PT Treatment/Interventions ADLs/Self Care Home Management;Therapeutic exercise;Therapeutic activities;Functional mobility training;Gait training;Balance training;Neuromuscular re-education;Patient/family education   PT Next Visit Plan Nustep, dynamic gait activities, balance, further progression of OTAGO if able   Consulted and Agree with Plan of Care Patient        Problem List Patient Active Problem  List   Diagnosis Date Noted  . BMI 37.0-37.9, adult 05/08/2015  . Morbid obesity (BMI 37.84) 02/19/2015  . Medicare annual wellness visit, subsequent 02/19/2015  . Hypertensive cardiovascular disease 03/06/2014  . Mixed hyperlipidemia 12/15/2013  . Prediabetes 12/15/2013  . Vitamin D deficiency 12/15/2013  . Medication management 12/15/2013  . DJD (degenerative joint disease)   . Allergic rhinitis   . Hypothyroidism   . Obesity, morbid ([name]) 06/13/2013  . Rotator cuff tear, right 05/08/2011  . COPD (chronic obstructive pulmonary disease) ([place]) 11/01/2009  . Obstructive sleep apnea 10/04/2009  . Chronic diastolic heart failure ([place]) 10/04/2009  . Diverticulosis of colon with hemorrhage 09/10/2009  . Personal history of colonic polyps 09/10/2009  . Atrial fibrillation ([place]) 09/11/2008  . Essential hypertension 09/08/2008  . GERD 09/08/2008  . BENIGN PROSTATIC HYPERTROPHY, HX OF 09/08/2008  Bjorn Loser, PTA  09/06/2015, 3:57 PM [facility] [address] [place], [place], [address] Phone: [phone]   Fax:  [phone]  Name: TRAYLEN ECKELS MRN: [id number] Date of Birth: 1930/11/01    Addendum: G Codes Functional Assessment Tool: Merrilee Jansky 31/56, TUG 21.7 sec, gait velocity 1.22 ft/sec Functional Limitation:   Mobility: Walking and Moving Around Current Status  [phone]): CK Goal Status CK [phone]): CK  Physical Therapy Progress Note  Dates of Reporting Period: 06/20/15 to 09/06/14  Objective Reports of Subjective Statement: No falls, stood unsupported 2 minutes  Objective Measurements: Merrilee Jansky  improved 11/56>31/56; TUG improve 21.7 sec from 29.88 sec, gait velocity decreased from 1.36 ft/sec to 1.22 ft/sec  Goal Update: Pt has met 2 of 5 short term goals; long term goals were extended and pt is progressing towards LTGs (LTG for Merrilee Jansky met with Merrilee Jansky score os 31/56)  Plan: Continue towards plan of care, with long term  goals to be checked within next 1-2 weeks  Reason Skilled Services are Required: Pt is improving per objective and subjective reports (of no falls); however, pt remains at fall risk and would continue to benefit from skilled PT services to decrease fall risk and improve overall functional mobility.  Mady Haagensen, PT 09/07/2015 12:12 PM Phone: [phone] Fax: [phone]

## 2015-09-10 ENCOUNTER — Ambulatory Visit: Payer: Medicare Other | Admitting: Physical Therapy

## 2015-09-10 ENCOUNTER — Encounter: Payer: Self-pay | Admitting: Physical Therapy

## 2015-09-10 DIAGNOSIS — R269 Unspecified abnormalities of gait and mobility: Secondary | ICD-10-CM | POA: Diagnosis not present

## 2015-09-10 DIAGNOSIS — R2689 Other abnormalities of gait and mobility: Secondary | ICD-10-CM | POA: Diagnosis not present

## 2015-09-10 DIAGNOSIS — M6281 Muscle weakness (generalized): Secondary | ICD-10-CM

## 2015-09-10 NOTE — Therapy (Signed)
[facility] [address] [place], [place], [address] Phone: [phone]   Fax:  [phone]  Physical Therapy Treatment  Patient Details  Name: Richard Rosales MRN: [id number] Date of Birth: Aug 19, 1930 Referring Provider: Wells Guiles Tat  Encounter Date: 09/10/2015      PT End of Session - 09/10/15 1437    Visit Number 11   Number of Visits 25  based on new POC (4 weeks)   Date for PT Re-Evaluation 09/19/15  based on new POC.    Authorization Type Medicare/UHC secondary; G-code every 10th visit   PT Start Time 1403   PT Stop Time 1445   PT Time Calculation (min) 42 min   Equipment Utilized During Treatment Gait belt   Activity Tolerance Patient limited by fatigue   Behavior During Therapy WFL for tasks assessed/performed      Past Medical History  Diagnosis Date  . HTN (hypertension)   . Diastolic dysfunction   . Atrial fibrillation ([place])   . GERD (gastroesophageal reflux disease)   . Allergic rhinitis   . Diverticulosis of colon     recurrent GI bleeding  . OSA (obstructive sleep apnea)     compliants w/ CPAP  . Hyperplastic colon polyp 2003  . Chronic gastritis   . Positive PPD     remote  . Atrial flutter ([place])   . Acute bronchitis   . Anemia     iron deficient  . Shortness of breath   . Venous insufficiency   . Moderate aortic insufficiency   . DJD (degenerative joint disease)   . COPD (chronic obstructive pulmonary disease) ([place])   . Hypothyroidism   . Adenomatous colon polyp   . GI bleed     Past Surgical History  Procedure Laterality Date  . Cardiac catheterization  7.27.05    revealed a preserved EF with no CAD  . Tonsillectomy    . Total knee arthroplasty      bilateral  . Nasal sinus surgery    . Colonoscopy    . Joint replacement  2001    lt total knee-rt one done 00  . Hernia repair Bilateral   . Shoulder arthroscopy    . Polypectomy      adenomatous polyps 2010  . Toe  surgery Right     There were no vitals filed for this visit.  Visit Diagnosis:  Muscle weakness of lower extremity      Subjective Assessment - 09/10/15 1408    Subjective "I've been been walking around the house some more."  Waiting for his son to go to see about exercise equipment; would like a recumbant bike.   Patient is accompained by: Family member   Pertinent History COPD, HTN, GERD   Patient Stated Goals Pt's goal for therapy is to improve weakness and trembling as well as balance.   Currently in Pain? Yes   Pain Score 3    Pain Location Generalized   Pain Descriptors / Indicators Aching   Pain Type Chronic pain   Pain Onset More than a month ago   Pain Frequency Intermittent   Aggravating Factors  "nothing that I can identify"                         [facility] Adult PT Treatment/Exercise - 09/10/15 0001    Knee/Hip Exercises: Aerobic   Nustep L=4 , 10 min   Knee/Hip Exercises: Standing   Heel Raises Both;1 set;10  reps , 1 UE support ,heel/toes raises   Knee Flexion AROM;2 sets;10 reps           [facility]) - 09/10/15 1421    PWR! Up x10   PWR! [address] [place]   PWR! Twist x20   Comments Standing with 1 UE support, cues for technique.             PT Education - 09/10/15 1424    Education provided Yes   Education Details Instructed pt in wt shifting strategies for standing PWR! Moves.   Person(s) Educated Patient   Methods Explanation;Demonstration;Verbal cues   Comprehension Verbalized understanding;Returned demonstration;Need further instruction          PT Short Term Goals - 07/31/15 1116    PT SHORT TERM GOAL #1   Title Pt will perform HEP with wife's supervision for improved strength, balance, gait for improved functional mobility.  TARGET 07/20/15   Time 4   Period Weeks   Status Achieved   PT SHORT TERM GOAL #2   Title Pt will improve TUG score to less than or equal to 23 seconds for decreased fall risk.   Time 4   Period Weeks    Status Not Met   PT SHORT TERM GOAL #3   Title Pt will improve Berg score to at least 16/56 for decreased fall risk.   Time 4   Period Weeks   Status Achieved   PT SHORT TERM GOAL #4   Title Pt will perform at least 6 of 10 reps of sit<>stand transfers with UE support, modified independently, for improved transfer efficiency and safety.   Baseline definite use of hands   Time 4   Period Weeks   Status Partially Met   PT SHORT TERM GOAL #5   Title Pt will perform floor>stand transfer with UE support and min assist, for safe fall recovery.   Baseline Pt declines today due to knee pain.   Time 4   Period Weeks   Status Deferred           PT Long Term Goals - 08/27/15 1305    PT LONG TERM GOAL #1   Title Pt/caregiver will verbalize understanding of fall prevention in home environment.  Modified Target Date: 09/17/15   Baseline met 08/20/15   Time 8   Period Weeks   Status Achieved   PT LONG TERM GOAL #2   Title Pt will improve TUG score to less than or equal to 20 seconds for decreased fall risk. (Modified Target Date: 09/17/15)   Baseline 46.78 secs on 08/21/15   Time 8   Period Weeks   Status On-going   PT LONG TERM GOAL #3   Title Pt will improve Berg score to at least 26/56 for decreased fall risk.   Baseline Did not have time to finish testing   Time 8   Period Weeks   Status On-going   PT LONG TERM GOAL #4   Title Pt will improve gait velocity to at least 1.8 ft/sec for improved gait efficiency and safety.   Baseline .74 ft/sec on 08/21/15   Time 8   Period Weeks   Status On-going   PT LONG TERM GOAL #5   Title Pt will verbalize plans for continued community fitness upon D/C from PT.   Time 8   Period Weeks   Status On-going               Plan - 09/10/15 1428  Clinical Impression Statement Skilled session focused on LE strengthening/endurance on Nustep, Standing balance with wt shifting and stepping with PWR! Moves, pt requires at least 1 UE support for  balance but able to follow through with wt shifting tasks.   Pt will benefit from skilled therapeutic intervention in order to improve on the following deficits Abnormal gait;Decreased balance;Decreased mobility;Decreased strength;Difficulty walking;Postural dysfunction;Pain   Rehab Potential Good   PT Frequency 2x / week   PT Duration 4 weeks   PT Treatment/Interventions ADLs/Self Care Home Management;Therapeutic exercise;Therapeutic activities;Functional mobility training;Gait training;Balance training;Neuromuscular re-education;Patient/family education   PT Next Visit Plan Nustep, dynamic gait activities, balance, further progression of OTAGO if able   Consulted and Agree with Plan of Care Patient        Problem List Patient Active Problem List   Diagnosis Date Noted  . BMI 37.0-37.9, adult 05/08/2015  . Morbid obesity (BMI 37.84) 02/19/2015  . Medicare annual wellness visit, subsequent 02/19/2015  . Hypertensive cardiovascular disease 03/06/2014  . Mixed hyperlipidemia 12/15/2013  . Prediabetes 12/15/2013  . Vitamin D deficiency 12/15/2013  . Medication management 12/15/2013  . DJD (degenerative joint disease)   . Allergic rhinitis   . Hypothyroidism   . Obesity, morbid ([place]) 06/13/2013  . Rotator cuff tear, right 05/08/2011  . COPD (chronic obstructive pulmonary disease) ([place]) 11/01/2009  . Obstructive sleep apnea 10/04/2009  . Chronic diastolic heart failure ([place]) 10/04/2009  . Diverticulosis of colon with hemorrhage 09/10/2009  . Personal history of colonic polyps 09/10/2009  . Atrial fibrillation ([place]) 09/11/2008  . Essential hypertension 09/08/2008  . GERD 09/08/2008  . BENIGN PROSTATIC HYPERTROPHY, HX OF 09/08/2008   Bjorn Loser, PTA  09/10/2015, 2:50 PM [facility] [address] [place], [place], [address] Phone: [phone]   Fax:  [phone]  Name: Richard Rosales MRN: [id number] Date of Birth:  [birth year]

## 2015-09-13 ENCOUNTER — Ambulatory Visit: Payer: Medicare Other | Admitting: Physical Therapy

## 2015-09-17 ENCOUNTER — Encounter: Payer: Self-pay | Admitting: Physical Therapy

## 2015-09-17 ENCOUNTER — Ambulatory Visit: Payer: Medicare Other | Admitting: Physical Therapy

## 2015-09-17 VITALS — HR 62

## 2015-09-17 DIAGNOSIS — M6281 Muscle weakness (generalized): Secondary | ICD-10-CM

## 2015-09-17 DIAGNOSIS — R2689 Other abnormalities of gait and mobility: Secondary | ICD-10-CM | POA: Diagnosis not present

## 2015-09-17 DIAGNOSIS — R269 Unspecified abnormalities of gait and mobility: Secondary | ICD-10-CM | POA: Diagnosis not present

## 2015-09-17 NOTE — Therapy (Signed)
[facility] [address] [place], [place], [address] Phone: [phone]   Fax:  [phone]  Physical Therapy Treatment  Patient Details  Name: Richard Rosales MRN: [id number] Date of Birth: 1931-02-03 Referring Provider: Wells Guiles Tat  Encounter Date: 09/17/2015      PT End of Session - 09/17/15 1546    Visit Number 12   Number of Visits 25  based on new POC (4 weeks)   Date for PT Re-Evaluation 09/19/15  based on new POC.    Authorization Type Medicare/UHC secondary; G-code every 10th visit   PT Start Time 1403   PT Stop Time 1445   PT Time Calculation (min) 42 min   Equipment Utilized During Treatment Gait belt   Activity Tolerance Patient limited by fatigue   Behavior During Therapy WFL for tasks assessed/performed      Past Medical History  Diagnosis Date  . HTN (hypertension)   . Diastolic dysfunction   . Atrial fibrillation ([place])   . GERD (gastroesophageal reflux disease)   . Allergic rhinitis   . Diverticulosis of colon     recurrent GI bleeding  . OSA (obstructive sleep apnea)     compliants w/ CPAP  . Hyperplastic colon polyp 2003  . Chronic gastritis   . Positive PPD     remote  . Atrial flutter ([place])   . Acute bronchitis   . Anemia     iron deficient  . Shortness of breath   . Venous insufficiency   . Moderate aortic insufficiency   . DJD (degenerative joint disease)   . COPD (chronic obstructive pulmonary disease) ([place])   . Hypothyroidism   . Adenomatous colon polyp   . GI bleed     Past Surgical History  Procedure Laterality Date  . Cardiac catheterization  7.27.05    revealed a preserved EF with no CAD  . Tonsillectomy    . Total knee arthroplasty      bilateral  . Nasal sinus surgery    . Colonoscopy    . Joint replacement  2001    lt total knee-rt one done 00  . Hernia repair Bilateral   . Shoulder arthroscopy    . Polypectomy      adenomatous polyps 2010  . Toe  surgery Right     Filed Vitals:   09/17/15 1423  Pulse: 62  SpO2: 92%    Visit Diagnosis:  Muscle weakness of lower extremity  Abnormality of gait due to impairment of balance      Subjective Assessment - 09/17/15 1412    Subjective No falls since last visit.  "Wife said she noticed I am walking around the house more." "Aching is sapping my energy. "   Patient is accompained by: Family member   Pertinent History COPD, HTN, GERD   Patient Stated Goals Pt's goal for therapy is to improve weakness and trembling as well as balance.   Currently in Pain? Yes   Pain Score 4    Pain Location Generalized   Pain Descriptors / Indicators Aching   Pain Type Chronic pain   Pain Onset More than a month ago   Pain Frequency Constant                         [facility] Adult PT Treatment/Exercise - 09/17/15 0001    Knee/Hip Exercises: Aerobic   Nustep L=4 2mn +438m with rest break.  Balance Exercises - 09/17/15 1426    OTAGO PROGRAM   Backwards Walking Support   Sideways Walking No assistive device   Tandem Stance 10 seconds, support   Tandem Walk Support           PT Education - 09/17/15 1545    Education provided Yes   Education Details balance strategies for standing with narrow BOS   Person(s) Educated Patient   Methods Explanation;Demonstration;Verbal cues   Comprehension Verbalized understanding;Returned demonstration;Verbal cues required;Need further instruction          PT Short Term Goals - 07/31/15 1116    PT SHORT TERM GOAL #1   Title Pt will perform HEP with wife's supervision for improved strength, balance, gait for improved functional mobility.  TARGET 07/20/15   Time 4   Period Weeks   Status Achieved   PT SHORT TERM GOAL #2   Title Pt will improve TUG score to less than or equal to 23 seconds for decreased fall risk.   Time 4   Period Weeks   Status Not Met   PT SHORT TERM GOAL #3   Title Pt will improve Berg score to at  least 16/56 for decreased fall risk.   Time 4   Period Weeks   Status Achieved   PT SHORT TERM GOAL #4   Title Pt will perform at least 6 of 10 reps of sit<>stand transfers with UE support, modified independently, for improved transfer efficiency and safety.   Baseline definite use of hands   Time 4   Period Weeks   Status Partially Met   PT SHORT TERM GOAL #5   Title Pt will perform floor>stand transfer with UE support and min assist, for safe fall recovery.   Baseline Pt declines today due to knee pain.   Time 4   Period Weeks   Status Deferred           PT Long Term Goals - 08/27/15 1305    PT LONG TERM GOAL #1   Title Pt/caregiver will verbalize understanding of fall prevention in home environment.  Modified Target Date: 09/17/15   Baseline met 08/20/15   Time 8   Period Weeks   Status Achieved   PT LONG TERM GOAL #2   Title Pt will improve TUG score to less than or equal to 20 seconds for decreased fall risk. (Modified Target Date: 09/17/15)   Baseline 46.78 secs on 08/21/15   Time 8   Period Weeks   Status On-going   PT LONG TERM GOAL #3   Title Pt will improve Berg score to at least 26/56 for decreased fall risk.   Baseline Did not have time to finish testing   Time 8   Period Weeks   Status On-going   PT LONG TERM GOAL #4   Title Pt will improve gait velocity to at least 1.8 ft/sec for improved gait efficiency and safety.   Baseline .74 ft/sec on 08/21/15   Time 8   Period Weeks   Status On-going   PT LONG TERM GOAL #5   Title Pt will verbalize plans for continued community fitness upon D/C from PT.   Time 8   Period Weeks   Status On-going               Plan - 09/17/15 1546    Clinical Impression Statement Pt demonstrated less activity tolerance needing rest during nu step activity and unable to complete 10 min.  Pt also required seated  rest breaks after each standing balance activity.  Progressed with a few standing balance exercises on Ortago  program.   Pt will benefit from skilled therapeutic intervention in order to improve on the following deficits Abnormal gait;Decreased balance;Decreased mobility;Decreased strength;Difficulty walking;Postural dysfunction;Pain   Rehab Potential Good   PT Frequency 2x / week   PT Duration 4 weeks   PT Treatment/Interventions ADLs/Self Care Home Management;Therapeutic exercise;Therapeutic activities;Functional mobility training;Gait training;Balance training;Neuromuscular re-education;Patient/family education   PT Next Visit Plan Nustep, dynamic gait activities, balance, Pt plans to bring OTAGO folder next visit to add balance exercises.    Consulted and Agree with Plan of Care Patient        Problem List Patient Active Problem List   Diagnosis Date Noted  . BMI 37.0-37.9, adult 05/08/2015  . Morbid obesity (BMI 37.84) 02/19/2015  . Medicare annual wellness visit, subsequent 02/19/2015  . Hypertensive cardiovascular disease 03/06/2014  . Mixed hyperlipidemia 12/15/2013  . Prediabetes 12/15/2013  . Vitamin D deficiency 12/15/2013  . Medication management 12/15/2013  . DJD (degenerative joint disease)   . Allergic rhinitis   . Hypothyroidism   . Obesity, morbid ([place]) 06/13/2013  . Rotator cuff tear, right 05/08/2011  . COPD (chronic obstructive pulmonary disease) ([place]) 11/01/2009  . Obstructive sleep apnea 10/04/2009  . Chronic diastolic heart failure ([place]) 10/04/2009  . Diverticulosis of colon with hemorrhage 09/10/2009  . Personal history of colonic polyps 09/10/2009  . Atrial fibrillation ([place]) 09/11/2008  . Essential hypertension 09/08/2008  . GERD 09/08/2008  . BENIGN PROSTATIC HYPERTROPHY, HX OF 09/08/2008    Bjorn Loser, PTA  09/17/2015, 3:52 PM   [facility] [address] [place], [place], [address] Phone: [phone]   Fax:  [phone]  Name: Richard Rosales MRN: [id number] Date of Birth:  November 22, 1930

## 2015-09-19 ENCOUNTER — Ambulatory Visit: Payer: Medicare Other | Admitting: Physical Therapy

## 2015-09-19 DIAGNOSIS — R2689 Other abnormalities of gait and mobility: Secondary | ICD-10-CM

## 2015-09-19 DIAGNOSIS — R269 Unspecified abnormalities of gait and mobility: Secondary | ICD-10-CM | POA: Diagnosis not present

## 2015-09-19 DIAGNOSIS — M6281 Muscle weakness (generalized): Secondary | ICD-10-CM | POA: Diagnosis not present

## 2015-09-19 NOTE — Patient Instructions (Signed)

## 2015-09-20 ENCOUNTER — Telehealth: Payer: Self-pay | Admitting: Neurology

## 2015-09-20 NOTE — Therapy (Signed)
[facility] [address] [place], [place], [address] Phone: [phone]   Fax:  [phone]  Physical Therapy Treatment  Patient Details  Name: COSMO TETREAULT MRN: [id number] Date of Birth: 1931/01/29 Referring Provider: Wells Guiles Tat  Encounter Date: 09/19/2015      PT End of Session - 09/20/15 1604    Visit Number 13   Number of Visits 25  based on new POC (4 weeks)   Date for PT Re-Evaluation 09/19/15  based on new POC.    Authorization Type Medicare/UHC secondary; G-code every 10th visit   PT Start Time 24  Pt arrives late   PT Stop Time 1220   PT Time Calculation (min) 27 min   Activity Tolerance Patient limited by fatigue   Behavior During Therapy [facility] for tasks assessed/performed      Past Medical History  Diagnosis Date  . HTN (hypertension)   . Diastolic dysfunction   . Atrial fibrillation ([place])   . GERD (gastroesophageal reflux disease)   . Allergic rhinitis   . Diverticulosis of colon     recurrent GI bleeding  . OSA (obstructive sleep apnea)     compliants w/ CPAP  . Hyperplastic colon polyp 2003  . Chronic gastritis   . Positive PPD     remote  . Atrial flutter ([place])   . Acute bronchitis   . Anemia     iron deficient  . Shortness of breath   . Venous insufficiency   . Moderate aortic insufficiency   . DJD (degenerative joint disease)   . COPD (chronic obstructive pulmonary disease) ([place])   . Hypothyroidism   . Adenomatous colon polyp   . GI bleed     Past Surgical History  Procedure Laterality Date  . Cardiac catheterization  7.27.05    revealed a preserved EF with no CAD  . Tonsillectomy    . Total knee arthroplasty      bilateral  . Nasal sinus surgery    . Colonoscopy    . Joint replacement  2001    lt total knee-rt one done 00  . Hernia repair Bilateral   . Shoulder arthroscopy    . Polypectomy      adenomatous polyps 2010  . Toe surgery Right     There were no  vitals filed for this visit.  Visit Diagnosis:  Other abnormalities of gait and mobility      Subjective Assessment - 09/20/15 1555    Subjective The achiness and tiredness just isn't going away.  Put a call into Dr. Doristine Devoid office, but I haven't heard back.   Currently in Pain? Yes   Pain Score 4    Pain Location Generalized   Pain Descriptors / Indicators Aching   Pain Type Chronic pain   Pain Frequency Constant   Aggravating Factors  nothing   Pain Relieving Factors rest                         [facility] Adult PT Treatment/Exercise - 09/20/15 0001    Ambulation/Gait   Ambulation/Gait Yes   Ambulation/Gait Assistance 5: Supervision   Ambulation/Gait Assistance Details Overall slowed gait pattern, forward posure   Ambulation Distance (Feet) 120 Feet   Assistive device Rolling walker   Gait Pattern Step-through pattern;Decreased step length - right;Decreased step length - left;Antalgic;Poor foot clearance - left;Poor foot clearance - right   Ambulation Surface Level;Indoor   Gait velocity 32.47 sec = 1.01  ft/sec   Gait Comments Pt reports needing to stop for sitting break in lobby upon walking in from parking lot due to fatigue.   Timed Up and Go Test   TUG Normal TUG   Normal TUG (seconds) 33.78   High Level Balance   High Level Balance Comments Berg not re-assessed fully today, as most recent Merrilee Jansky was completed on 09/06/15, with score of 31/56.   Self-Care   Self-Care Other Self-Care Comments   Other Self-Care Comments  Discussed limitaitions in progress with therapy due to inconsistent therapy visits as well as continued pain and fatigue limiting further progress with therapy.  PT encouraged pt to follow up with physician regarding ongoing concerns limiting mobility.  Provided patient with list of options for community fitness, including [employer] [address], [place] and [employer].                  PT Education - 09/20/15 1603    Education  provided Yes   Education Details Limitations in progress towards therapy goals, community fitness options, plans for discharge today   Person(s) Educated Patient   Methods Explanation   Comprehension Verbalized understanding          PT Short Term Goals - 07/31/15 1116    PT SHORT TERM GOAL #1   Title Pt will perform HEP with wife's supervision for improved strength, balance, gait for improved functional mobility.  TARGET 07/20/15   Time 4   Period Weeks   Status Achieved   PT SHORT TERM GOAL #2   Title Pt will improve TUG score to less than or equal to 23 seconds for decreased fall risk.   Time 4   Period Weeks   Status Not Met   PT SHORT TERM GOAL #3   Title Pt will improve Berg score to at least 16/56 for decreased fall risk.   Time 4   Period Weeks   Status Achieved   PT SHORT TERM GOAL #4   Title Pt will perform at least 6 of 10 reps of sit<>stand transfers with UE support, modified independently, for improved transfer efficiency and safety.   Baseline definite use of hands   Time 4   Period Weeks   Status Partially Met   PT SHORT TERM GOAL #5   Title Pt will perform floor>stand transfer with UE support and min assist, for safe fall recovery.   Baseline Pt declines today due to knee pain.   Time 4   Period Weeks   Status Deferred           PT Long Term Goals - 09/19/15 1200    PT LONG TERM GOAL #1   Title Pt/caregiver will verbalize understanding of fall prevention in home environment.  Modified Target Date: 09/17/15   Baseline met 08/20/15   Time 8   Period Weeks   Status Achieved   PT LONG TERM GOAL #2   Title Pt will improve TUG score to less than or equal to 20 seconds for decreased fall risk. (Modified Target Date: 09/17/15)   Baseline 46.78 secs on 08/21/15; 33.78 sec 09/19/15   Time 8   Period Weeks   Status Not Met   PT LONG TERM GOAL #3   Title Pt will improve Berg score to at least 26/56 for decreased fall risk.   Baseline Berg test 09/06/15 31/56    Time 8   Period Weeks   Status Achieved   PT LONG TERM GOAL #  4   Title Pt will improve gait velocity to at least 1.8 ft/sec for improved gait efficiency and safety.   Baseline .74 ft/sec on 08/21/15; 1.01 ft/sec   Time 8   Period Weeks   Status Not Met   PT LONG TERM GOAL #5   Title Pt will verbalize plans for continued community fitness upon D/C from PT.   Time 8   Period Weeks   Status Achieved               Plan - 09/20/15 1605    Clinical Impression Statement Checked LTGs today and discussed discharge.  Pt has previously met LTG #1.  LTG #2 and 4 not met.  LTG #3 and 5 met.  Pt has not made significant progress towards goals and is limited by pain and fatigue.  Pt agreeable to discharge from therapy at this time to try to continue with community fitness and follow up with physician.   Pt will benefit from skilled therapeutic intervention in order to improve on the following deficits Abnormal gait;Decreased balance;Decreased mobility;Decreased strength;Difficulty walking;Postural dysfunction;Pain   Rehab Potential Good   PT Frequency 2x / week   PT Duration 4 weeks   PT Treatment/Interventions ADLs/Self Care Home Management;Therapeutic exercise;Therapeutic activities;Functional mobility training;Gait training;Balance training;Neuromuscular re-education;Patient/family education   PT Next Visit Plan Discharge this visit.   Consulted and Agree with Plan of Care Patient          G-Codes - 09-27-2015 1607    Functional Assessment Tool Used Merrilee Jansky 31/56, TUG 33.78 sec, gait velocity 1.01 ft/sec with RW   Functional Limitation Mobility: Walking and moving around   Mobility: Walking and Moving Around Goal Status [id number]) At least 40 percent but less than 60 percent impaired, limited or restricted   Mobility: Walking and Moving Around Discharge Status [phone]) At least 40 percent but less than 60 percent impaired, limited or restricted      Problem List Patient Active Problem List    Diagnosis Date Noted  . BMI 37.0-37.9, adult 05/08/2015  . Morbid obesity (BMI 37.84) 02/19/2015  . Medicare annual wellness visit, subsequent 02/19/2015  . Hypertensive cardiovascular disease 03/06/2014  . Mixed hyperlipidemia 12/15/2013  . Prediabetes 12/15/2013  . Vitamin D deficiency 12/15/2013  . Medication management 12/15/2013  . DJD (degenerative joint disease)   . Allergic rhinitis   . Hypothyroidism   . Obesity, morbid ([place]) 06/13/2013  . Rotator cuff tear, right 05/08/2011  . COPD (chronic obstructive pulmonary disease) ([place]) 11/01/2009  . Obstructive sleep apnea 10/04/2009  . Chronic diastolic heart failure ([place]) 10/04/2009  . Diverticulosis of colon with hemorrhage 09/10/2009  . Personal history of colonic polyps 09/10/2009  . Atrial fibrillation ([place]) 09/11/2008  . Essential hypertension 09/08/2008  . GERD 09/08/2008  . BENIGN PROSTATIC HYPERTROPHY, HX OF 09/08/2008    MARRIOTT,AMY W. 09/20/2015, 4:09 PM  Frazier Butt., PT  [facility] [address] [place], [place], [address] Phone: [phone]   Fax:  [phone]  Name: CONSTANT MANDEVILLE MRN: [id number] Date of Birth: 06-30-1930  PHYSICAL THERAPY DISCHARGE SUMMARY  Visits from Start of Care: 13  Current functional level related to goals / functional outcomes: See goal check above   Remaining deficits: Balance, gait, fatigue   Education / Equipment: Educated in fall prevention, HEP, and community fitness.  Plan: Patient agrees to discharge.  Patient goals were partially met. Patient is being discharged due to lack of progress.  ?????Recommended pt follow-up with physician  regarding ongoing concerns with pain and fatigue.   Mady Haagensen, PT 09/20/2015 4:11 PM Phone: [phone] Fax: [phone]

## 2015-09-20 NOTE — Telephone Encounter (Signed)
Patient called and states that he has now tried the Carbidopa Levodopa 25/100 CR and that it makes him fall asleep during the day out of nowhere. He states he doesn't feel sleepy, then he will just fall asleep for about 10-12 minutes and then wake up. He has stopped medication because he doesn't want to take it and drive. He states he does have sleep apnea. The last time his machine was titrated was about a year ago, but he denies any feelings of sleepiness and states he is sleeping well at night. He wants to discuss an alternative medication. Please advise.

## 2015-09-20 NOTE — Telephone Encounter (Signed)
There really is nothing else. I think it is okay that he just stops it.  I really am not convinced that it was going to help much anyway.

## 2015-09-21 NOTE — Telephone Encounter (Signed)
Patient's wife made aware no alternative medications- but okay to stop and stay off the medication. They will call with any questions.

## 2015-09-24 ENCOUNTER — Ambulatory Visit: Payer: Medicare Other | Admitting: Physical Therapy

## 2015-09-26 ENCOUNTER — Ambulatory Visit: Payer: Medicare Other | Admitting: Physical Therapy

## 2015-10-15 ENCOUNTER — Ambulatory Visit (INDEPENDENT_AMBULATORY_CARE_PROVIDER_SITE_OTHER): Payer: Medicare Other | Admitting: *Deleted

## 2015-10-15 DIAGNOSIS — N3000 Acute cystitis without hematuria: Secondary | ICD-10-CM

## 2015-10-15 NOTE — Progress Notes (Signed)
Patient ID: Richard Rosales, male   DOB: 09/09/1930, 82 y.o.   MRN: [id number] Patient here for NV to recheck his urine.  Patient has finished the Cipro and has no UTI symptoms.  Patient had  UA and a culture and sensitivity checked today.

## 2015-10-16 DIAGNOSIS — N3000 Acute cystitis without hematuria: Secondary | ICD-10-CM | POA: Diagnosis not present

## 2015-10-17 LAB — URINALYSIS, MICROSCOPIC ONLY
Bacteria, UA: NONE SEEN [HPF]
CRYSTALS: NONE SEEN [HPF]
Casts: NONE SEEN [LPF]
RBC / HPF: NONE SEEN RBC/HPF (ref <=2)
Squamous Epithelial / LPF: NONE SEEN [HPF] (ref <=5)
WBC UA: NONE SEEN WBC/HPF (ref <=5)
Yeast: NONE SEEN [HPF]

## 2015-10-17 LAB — URINALYSIS, ROUTINE W REFLEX MICROSCOPIC
BILIRUBIN URINE: NEGATIVE
Glucose, UA: NEGATIVE
HGB URINE DIPSTICK: NEGATIVE
KETONES UR: NEGATIVE
NITRITE: NEGATIVE
PROTEIN: NEGATIVE
Specific Gravity, Urine: 1.016 (ref 1.001–1.035)
pH: 5.5 (ref 5.0–8.0)

## 2015-10-18 ENCOUNTER — Ambulatory Visit (INDEPENDENT_AMBULATORY_CARE_PROVIDER_SITE_OTHER): Payer: Medicare Other | Admitting: Podiatry

## 2015-10-18 ENCOUNTER — Encounter: Payer: Self-pay | Admitting: Podiatry

## 2015-10-18 DIAGNOSIS — B351 Tinea unguium: Secondary | ICD-10-CM

## 2015-10-18 DIAGNOSIS — L988 Other specified disorders of the skin and subcutaneous tissue: Secondary | ICD-10-CM | POA: Diagnosis not present

## 2015-10-18 DIAGNOSIS — Q828 Other specified congenital malformations of skin: Secondary | ICD-10-CM | POA: Diagnosis not present

## 2015-10-18 DIAGNOSIS — L858 Other specified epidermal thickening: Secondary | ICD-10-CM | POA: Diagnosis not present

## 2015-10-18 DIAGNOSIS — M79673 Pain in unspecified foot: Secondary | ICD-10-CM | POA: Diagnosis not present

## 2015-10-18 LAB — URINE CULTURE
Colony Count: NO GROWTH
ORGANISM ID, BACTERIA: NO GROWTH

## 2015-10-18 NOTE — Progress Notes (Signed)
Patient ID: Richard Rosales, male   DOB: 1930-09-25, 82 y.o.   MRN: [id number] Complaint:  Visit Type: Patient returns to my office for continued preventative foot care services. Complaint: Patient states" my nails have grown long and thick and become painful to walk and wear shoes".  He also has painful callus on the tip of his big toe right foot. Upon exam of this skin lesion, it was noted that the stalk of the porokeratosis was minimally attached. Patient has been diagnosed with DM with no complications. He presents for preventative foot care services. No changes to ROS  Podiatric Exam: Vascular: dorsalis pedis and posterior tibial pulses are absent bilateral. Capillary return is immediate. Temperature gradient is decreased.. Skin turgor WNL  Sensorium: Normal Semmes Weinstein monofilament test. Normal tactile sensation bilaterally. Nail Exam: Pt has thick disfigured discolored nails with subungual debris noted bilateral entire nail hallux through fifth toenails Ulcer Exam: There is no evidence of ulcer or pre-ulcerative changes or infection. Orthopedic Exam: Muscle tone and strength are WNL. No limitations in general ROM. No crepitus or effusions noted. Foot type and digits show no abnormalities. Bony prominences are unremarkable. Skin:  Porokeratosis present at the distal aspect right hallux.. No signs of redness or infection.  It appears as an open ulcer.  No drainage or infection noted.  Diagnosis:  Tinea unguium, Pain in right toe, pain in left toes,Porokeratosis right hallux  Treatment & Plan Procedures and Treatment: Consent by patient was obtained for treatment procedures. The patient understood the discussion of treatment and procedures well. All questions were answered thoroughly reviewed. Debridement of mycotic and hypertrophic toenails, 1 through 5 bilateral and clearing of subungual debris. No ulceration, no infection noted. The stalk of the porokeratosis right hallux was removed and  sent to [facility] for evaluation.   Return Visit-Office Procedure: Patient instructed to return to the office for a follow up visit 10 weeks for continued evaluation and treatment.  Gardiner Barefoot DPM

## 2015-10-19 ENCOUNTER — Telehealth: Payer: Self-pay | Admitting: *Deleted

## 2015-10-19 DIAGNOSIS — B079 Viral wart, unspecified: Secondary | ICD-10-CM

## 2015-10-19 DIAGNOSIS — B078 Other viral warts: Secondary | ICD-10-CM

## 2015-10-19 NOTE — Telephone Encounter (Signed)
Right 1st distal toe skin wedge sent to [facility] for definitive diagnosis.

## 2015-11-02 ENCOUNTER — Encounter: Payer: Self-pay | Admitting: Podiatry

## 2015-11-08 ENCOUNTER — Ambulatory Visit (INDEPENDENT_AMBULATORY_CARE_PROVIDER_SITE_OTHER): Payer: Medicare Other | Admitting: Emergency Medicine

## 2015-11-08 ENCOUNTER — Encounter: Payer: Self-pay | Admitting: Emergency Medicine

## 2015-11-08 VITALS — BP 112/66 | HR 76 | Wt 248.0 lb

## 2015-11-08 DIAGNOSIS — G4733 Obstructive sleep apnea (adult) (pediatric): Secondary | ICD-10-CM

## 2015-11-08 DIAGNOSIS — J449 Chronic obstructive pulmonary disease, unspecified: Secondary | ICD-10-CM | POA: Diagnosis not present

## 2015-11-08 DIAGNOSIS — J309 Allergic rhinitis, unspecified: Secondary | ICD-10-CM | POA: Diagnosis not present

## 2015-11-08 NOTE — Assessment & Plan Note (Signed)
Increase Astelin twice a day. Continue chlorpheniramine.

## 2015-11-08 NOTE — Assessment & Plan Note (Signed)
Continue CPAP.  

## 2015-11-08 NOTE — Patient Instructions (Signed)
Please continue your chlorpheniramine twice a day  Start using your astelin nasal spray, 1 -2 sprays twice a day  Continue your Spiriva and Symbicort Keep albuterol available to use for shortness of breath, wheezing, or spells of coughing  You need to wear your oxygen with exertion. We will try to get you a backpack to carry the tanks.  Follow with Dr Lamonte Sakai in 4 months or sooner if you have any problems.

## 2015-11-08 NOTE — Assessment & Plan Note (Signed)
Continue Spiriva and Symbicort. We discussed times to use albuterol when necessary. We also discussed his oxygen use. Encouraged him to use it more reliably with exertion.

## 2015-11-08 NOTE — Progress Notes (Signed)
Subjective:  Patient ID: Richard Rosales, male    DOB: 1930-09-10, 82 y.o.   MRN: [id number]  82 yo man, former smoker (quit'80)  For FU of COPD &  OSA on CPAP 12 cm. He has  hx of HTN, A Fib, chronic diastolic dysfxn and LE edema, GERD, diverticulosis, remote positive PPD.    ROV 06/27/14 -- hx of Severe COPD with associated chronic bronchitis that frequently exacerbates. He has OSA and is compliant with CPAP. Last time he had sx consistent with acute bronchitis, was treated with pred + clarithro. He has dyspnea with activity, chores that requires him to rest. He is still coughing, gets a hoarse voice, has to clear secretions every am. He had severe mood swings with prednisone.   ROV 09/07/14 -- follow-up visit for Severe COPD with associated chronic bronchitis that frequently exacerbates. He has OSA and is compliant with CPAP. He was treated with levaquin and medrol beginning 09/05/14 - he has had more cough and sputum for the last 10-14 days. Since starting the levaquin and medrol he is doing a bit better, still with DOE and wheeze  ROV 03/01/15 -- follow-up visit for severe COPD, chronic bronchitis, obstructive sleep apnea on CPAP. He last saw TP in May.  He has oxygen that he uses with most activity, uses at home. He had a portable concentrator but didn't like it as much as the tanks. He is on symbicort and spiriva. Breathing appears to be stable at this time. Uses albuterol very rarely.   ROV 08/21/15 -- she has a history of severe COPD with chronic bronchitic phenotype. He also has OSA on CPAP. He is currently managed on Spiriva plus Symbicort. He is also using Astelin nasal spray qhs, Prilosec, Chlor-Trimeton, Humibid. He tells me that he had a recent URI 1 month ago, possibly the flu. He was treated with Tamiflu. Was not treated with pred or abx. He was told recently that he had parkinsonism, tremor. Is following w neuro. Has daily cough, secretions especially in the am. Has seen a streak of blood  before x 1.   ROV 11/08/15 -- follow-up visit for history of obstructive sleep apnea. Severe COPD with chronic bronchitis. He is currently managed on Spiriva and Symbicort. He is on chlorpheniramine bid, astelin qhs.  He is having a lot of nasal drainage, lots of cough with light or clear mucous. His exertional tolerance is stable, he has exertional SOB. Has started wearing his O2 again - 2L/min when sitting still, not always when exerting. Might use a backpack - [employer]. He has albuterol but does not use it.    Filed Vitals:   11/08/15 1504  BP: 112/66  Pulse: 76  Weight: 248 lb (112.492 kg)  SpO2: 94%   Gen: Pleasant, kyphotic, in no distress,  normal affect  ENT: No lesions,  mouth clear,  oropharynx clear, no postnasal drip, some nasal obstruction  Neck: No JVD, no TMG, no carotid bruits  Lungs: No use of accessory muscles, clear without rales or rhonchi, very distant exp wheeze  Cardiovascular: RRR, heart sounds normal, no murmur or gallops, 1+ peripheral edema  Musculoskeletal: No deformities, no cyanosis or clubbing  Neuro: alert, non focal  Skin: Warm, no lesions or rashes      11/18/13 --  COMPARISON: No prior CT chest. Multiple prior chest x-rays, most  recently 11/17/2013.  FINDINGS:  Contrast opacification of the pulmonary arteries is excellent. No  filling defects within either main pulmonary artery or their  branches in either lung to suggest pulmonary embolism. Heart  markedly enlarged with left ventricular predominance and evidence of  left ventricular hypertrophy. Small pericardial effusion. Moderate 3  vessel coronary atherosclerosis. Severe atherosclerosis involving  the thoracic and upper abdominal aorta and their visualized branches  without evidence of aneurysm. Tortuous thoracic and upper abdominal  aorta.  Emphysematous changes throughout both lungs. Subsegmental airspace  consolidation with air bronchograms in the deep posterior right  lower lobe.  Lungs otherwise clear. No pulmonary parenchymal nodules  or masses. No pleural effusions.  Thyroid gland enlargement with multiple nodules, the largest nodules  adjacent to one another in the right lobe measuring approximately  2.5 x 2.2 x 3.1 cm in the mid pole and 3.0 x 3.0 x 2.1 cm in the  lower pole. Scattered normal size lymph nodes in the mediastinum,  hila, and axilla without significant lymphadenopathy. Mild bilateral  gynecomastia.  Irregularity involving the contour of the visualized liver with  relative enlargement of the left lobe. Cysts involving the mid  portions of the visualized kidneys. Moderate pancreatic atrophy.  Hiatal hernia containing predominately intra-abdominal fat.  Diverticulosis involving the visualized distal transverse and  proximal descending colon without evidence of acute diverticulitis.  Bone window images demonstrated diffuse thoracic spondylosis and  exaggeration of the usual thoracic kyphosis.  Review of the MIP images confirms the above findings.   IMPRESSION:  1. No evidence of pulmonary embolism.  2. COPD/emphysema. Atelectasis involving the right lower lobe. No  acute cardiopulmonary disease otherwise.  3. Multinodular goiter, with adjacent 3 cm nodules in the right  lobe.  4. Hepatic cirrhosis.  5. Moderate pancreatic atrophy.  6. Diverticulosis involving the visualized distal transverse and  proximal descending colon.  7. Cardiomegaly with 3 vessel coronary artery disease. Left  ventricular hypertrophy. Small pericardial effusion.  8. Mild bilateral gynecomastia   Assessment & Plan:   Obstructive sleep apnea Continue CPAP  COPD (chronic obstructive pulmonary disease) Continue Spiriva and Symbicort. We discussed times to use albuterol when necessary. We also discussed his oxygen use. Encouraged him to use it more reliably with exertion.  Allergic rhinitis Increase Astelin twice a day. Continue chlorpheniramine.     Baltazar Apo, MD, PhD 11/08/2015, 3:32 PM [facility] [phone] or if no answer [phone]

## 2015-11-15 ENCOUNTER — Other Ambulatory Visit: Payer: Self-pay | Admitting: Internal Medicine

## 2015-11-29 ENCOUNTER — Ambulatory Visit: Payer: Self-pay | Admitting: Internal Medicine

## 2015-12-10 ENCOUNTER — Telehealth: Payer: Self-pay | Admitting: Emergency Medicine

## 2015-12-10 MED ORDER — AZELASTINE HCL 0.1 % NA SOLN: 1.0000 | Freq: Two times a day (BID) | NASAL | Status: DC

## 2015-12-10 MED ORDER — BUDESONIDE-FORMOTEROL FUMARATE 160-4.5 MCG/ACT IN AERO
2.0000 | INHALATION_SPRAY | Freq: Two times a day (BID) | RESPIRATORY_TRACT | Status: DC
Start: 2015-12-10 — End: 2016-11-19

## 2015-12-10 NOTE — Telephone Encounter (Signed)
Spoke with pt. He needs refills on Astelin nasal spray and Symbicort. These have been sent in. Nothing further was needed.

## 2015-12-18 ENCOUNTER — Ambulatory Visit (INDEPENDENT_AMBULATORY_CARE_PROVIDER_SITE_OTHER): Payer: Medicare Other | Admitting: Internal Medicine

## 2015-12-18 ENCOUNTER — Encounter: Payer: Self-pay | Admitting: Internal Medicine

## 2015-12-18 VITALS — BP 140/66 | HR 56 | Temp 98.4°F | Resp 16 | Ht 67.0 in | Wt 249.0 lb

## 2015-12-18 DIAGNOSIS — Z79899 Other long term (current) drug therapy: Secondary | ICD-10-CM

## 2015-12-18 DIAGNOSIS — E559 Vitamin D deficiency, unspecified: Secondary | ICD-10-CM | POA: Diagnosis not present

## 2015-12-18 DIAGNOSIS — G20 Parkinson's disease: Secondary | ICD-10-CM

## 2015-12-18 DIAGNOSIS — J449 Chronic obstructive pulmonary disease, unspecified: Secondary | ICD-10-CM

## 2015-12-18 DIAGNOSIS — I10 Essential (primary) hypertension: Secondary | ICD-10-CM | POA: Diagnosis not present

## 2015-12-18 DIAGNOSIS — E782 Mixed hyperlipidemia: Secondary | ICD-10-CM | POA: Diagnosis not present

## 2015-12-18 DIAGNOSIS — G20C Parkinsonism, unspecified: Secondary | ICD-10-CM

## 2015-12-18 NOTE — Progress Notes (Signed)
Assessment and Plan:  Hypertension:  -well controlled -Continue medication,  -monitor blood pressure at home.  -Continue DASH diet.   -Reminder to go to the ER if any CP, SOB, nausea, dizziness, severe HA, changes vision/speech, left arm numbness and tingling, and jaw pain.  Cholesterol: -Continue diet and exercise.   Pre-diabetes: -Continue diet and exercise.   Vitamin D Def: -continue medications.   COPD  -non-smoker -cont inhalers -O2 use at home and during the night -cont to walk as much as tolerated  Parkinsonism -could not tolerate sinemet -refuses to go back to neuro -not interested in currently trying another medication -watchful waiting -do feel that he will eventually need wheelchair as his gait is very unstable.  Continue diet and meds as discussed. Further disposition pending results of labs.  HPI 82 y.o. male  presents for 3 month follow up with hypertension, hyperlipidemia, prediabetes and vitamin D.   His blood pressure has been controlled at home, today their BP is BP: 140/66 mmHg.   He does not workout. He denies chest pain, shortness of breath, dizziness.  He is limited with exercise secondary to his O2 dependent COPD.  He is doing sitting exercises and trying to walk as much as he can tolerate.   He is on cholesterol medication and denies myalgias. His cholesterol is at goal. The cholesterol last visit was:   Lab Results  Component Value Date   CHOL 134 08/28/2015   HDL 54 08/28/2015   LDLCALC 68 08/28/2015   TRIG 61 08/28/2015   CHOLHDL 2.5 08/28/2015     He has been working on diet and exercise for prediabetes, and denies foot ulcerations, hyperglycemia, hypoglycemia , increased appetite, nausea, polydipsia, polyuria, visual disturbances, vomiting and weight loss. Last A1C in the office was:  Lab Results  Component Value Date   HGBA1C 5.8* 08/28/2015    Patient is on Vitamin D supplement.  Lab Results  Component Value Date   VD25OH 45  08/28/2015     Patient reports that he has had several blackouts.  He reports that this happened after he started carbidopa levidopa.  He reports that he spoke to the neurologist about trying something else.  He reports that he asked to change the medication and the doctor never got back to him.  He reports that he felt like this didn't help.  He stopped the sinemet and has not had any further issues.  He would like to not go back to Dr. Brett Fairy.    He reports that his COPD has been stable.  He does think that eventually he is going to need a wheelchair as his walking is becoming more unstable.    Current Medications:  Current Outpatient Prescriptions on File Prior to Visit  Medication Sig Dispense Refill  . acetaminophen-codeine (TYLENOL #3) 300-30 MG per tablet Take 1 tablet by mouth every 4 (four) hours as needed for moderate pain.     Marland Kitchen albuterol (PROVENTIL HFA;VENTOLIN HFA) 108 (90 Base) MCG/ACT inhaler Inhale 2 puffs into the lungs every 6 (six) hours as needed for wheezing or shortness of breath. 1 Inhaler 2  . albuterol (PROVENTIL) (2.5 MG/3ML) 0.083% nebulizer solution Take 3 mLs (2.5 mg total) by nebulization every 4 (four) hours. 300 mL 11  . amoxicillin (AMOXIL) 500 MG capsule as directed. (DENTAL)    . aspirin 81 MG tablet Take 1 tablet 3 days per week    . azelastine (ASTELIN) 0.1 % nasal spray Place 1 spray into both nostrils 2 (  two) times daily. 90 mL 1  . b complex vitamins tablet Take 1 tablet by mouth every morning.     . budesonide-formoterol (SYMBICORT) 160-4.5 MCG/ACT inhaler Inhale 2 puffs into the lungs 2 (two) times daily. 3 Inhaler 3  . chlorpheniramine (CHLOR-TRIMETON) 4 MG tablet Take 1 tablet by mouth in the morning and 1 tablet at bedtime    . Cholecalciferol (VITAMIN D) 2000 UNITS CAPS Take 2 capsules by mouth daily.     Marland Kitchen diltiazem (DILACOR XR) 240 MG 24 hr capsule Take 1 capsule (240 mg total) by mouth 2 (two) times daily. 24 capsule 0  . docusate sodium  (COLACE) 100 MG capsule Take 100 mg by mouth 2 (two) times daily.      . fish oil-omega-3 fatty acids 1000 MG capsule Take 1 g by mouth 2 (two) times daily.      . furosemide (LASIX) 40 MG tablet Take 1 tablet (40 mg total) by mouth daily. 90 tablet 3  . glucosamine-chondroitin 500-400 MG tablet Take 1 tablet by mouth 1 day or 1 dose.    Marland Kitchen guaifenesin (HUMIBID E) 400 MG TABS Take 400 mg by mouth 2 (two) times daily.      Marland Kitchen HYDROcodone-acetaminophen (NORCO/VICODIN) 5-325 MG per tablet Take 1 tablet by mouth every 6 (six) hours as needed for moderate pain (Only takes during CT/MRI testing).     Marland Kitchen levothyroxine (SYNTHROID, LEVOTHROID) 100 MCG tablet TAKE 1 TABLET DAILY BEFORE BREAKFAST 90 tablet 99  . losartan (COZAAR) 100 MG tablet TAKE 1 TABLET DAILY 90 tablet 3  . omeprazole (PRILOSEC) 40 MG capsule TAKE 1 CAPSULE TWICE A DAY 180 capsule 0  . SPIRIVA HANDIHALER 18 MCG inhalation capsule INHALE THE CONTENTS OF 1 CAPSULE ONCE DAILY 90 capsule 1   No current facility-administered medications on file prior to visit.    Medical History:  Past Medical History  Diagnosis Date  . HTN (hypertension)   . Diastolic dysfunction   . Atrial fibrillation ([place])   . GERD (gastroesophageal reflux disease)   . Allergic rhinitis   . Diverticulosis of colon     recurrent GI bleeding  . OSA (obstructive sleep apnea)     compliants w/ CPAP  . Hyperplastic colon polyp 2003  . Chronic gastritis   . Positive PPD     remote  . Atrial flutter ([place])   . Acute bronchitis   . Anemia     iron deficient  . Shortness of breath   . Venous insufficiency   . Moderate aortic insufficiency   . DJD (degenerative joint disease)   . COPD (chronic obstructive pulmonary disease) ([place])   . Hypothyroidism   . Adenomatous colon polyp   . GI bleed     Allergies:  Allergies  Allergen Reactions  . Ace Inhibitors Cough  . Beta Adrenergic Blockers Other (See Comments)    bradycardia  . Decadron [Dexamethasone]   .  Fluticasone-Salmeterol     Unable to remember per pt  . Hytrin [Terazosin]     Nasal congestion   . Prednisone     Shakes / tremors  . Vioxx [Rofecoxib] Other (See Comments)    dyspepsia     Review of Systems:  Review of Systems  Constitutional: Negative for fever, chills and malaise/fatigue.  HENT: Negative for congestion, ear pain and sore throat.   Eyes: Negative.   Respiratory: Negative for cough, shortness of breath and wheezing.   Cardiovascular: Negative for chest pain, palpitations and leg swelling.  Gastrointestinal: Negative for heartburn, abdominal pain, diarrhea, constipation, blood in stool and melena.  Genitourinary: Negative.   Skin: Negative.   Neurological: Negative for dizziness, sensory change, loss of consciousness and headaches.  Psychiatric/Behavioral: Negative for depression. The patient is not nervous/anxious and does not have insomnia.     Family history- Review and unchanged  Social history- Review and unchanged  Physical Exam: BP 140/66 mmHg  Pulse 56  Temp(Src) 98.4 F (36.9 C) (Temporal)  Resp 16  Ht 5\' 7"  (1.702 m)  Wt 249 lb (112.946 kg)  BMI 38.99 kg/m2 Wt Readings from Last 3 Encounters:  12/18/15 249 lb (112.946 kg)  11/08/15 248 lb (112.492 kg)  08/30/15 244 lb (110.678 kg)    General Appearance: Well nourished well developed, in no apparent distress. Eyes: PERRLA, EOMs, conjunctiva no swelling or erythema ENT/Mouth: Ear canals normal without obstruction, swelling, erythma, discharge.  TMs normal bilaterally.  Oropharynx moist, clear, without exudate, or postoropharyngeal swelling. Neck: Supple, thyroid normal,no cervical adenopathy  Respiratory: Respiratory effort normal, Breath sounds distant but clear A&P without rhonchi, wheeze, or rale.  No retractions, no accessory usage. Cardio: RRR with no MRGs. Brisk peripheral pulses without edema.  Abdomen: Soft, + BS,  Non tender, no guarding, rebound, hernias, masses. Musculoskeletal:  Kyphotic, Full ROM, 5/5 strength, slow shuffling gait Skin: Warm, dry without rashes, lesions, ecchymosis.  Neuro: Awake and oriented X 3, Cranial nerves intact. Normal muscle tone, Mild cogwheel rigidity.   Psych: Normal affect, Insight and Judgment appropriate.    Starlyn Skeans, PA-C 3:33 PM [facility]

## 2015-12-27 ENCOUNTER — Encounter: Payer: Self-pay | Admitting: Podiatry

## 2015-12-27 ENCOUNTER — Ambulatory Visit (INDEPENDENT_AMBULATORY_CARE_PROVIDER_SITE_OTHER): Payer: Medicare Other | Admitting: Podiatry

## 2015-12-27 VITALS — BP 134/83 | HR 70 | Resp 14

## 2015-12-27 DIAGNOSIS — B351 Tinea unguium: Secondary | ICD-10-CM

## 2015-12-27 DIAGNOSIS — M79673 Pain in unspecified foot: Secondary | ICD-10-CM

## 2015-12-27 NOTE — Progress Notes (Signed)
Patient ID: Richard Rosales, male   DOB: August 25, 1930, 82 y.o.   MRN: [id number] Complaint:  Visit Type: Patient returns to my office for continued preventative foot care services. Complaint: Patient states" my nails have grown long and thick and become painful to walk and wear shoes".  He also has painful callus on the tip of his big toe right foot. Patient has been diagnosed with DM with no complications. He presents for preventative foot care services. No changes to ROS  Podiatric Exam: Vascular: dorsalis pedis and posterior tibial pulses are absent bilateral. Capillary return is immediate. Temperature gradient is decreased.. Skin turgor WNL  Sensorium: Normal Semmes Weinstein monofilament test. Normal tactile sensation bilaterally. Nail Exam: Pt has thick disfigured discolored nails with subungual debris noted bilateral entire nail hallux through fifth toenails Ulcer Exam: There is no evidence of ulcer or pre-ulcerative changes or infection. Orthopedic Exam: Muscle tone and strength are WNL. No limitations in general ROM. No crepitus or effusions noted. Foot type and digits show no abnormalities. Bony prominences are unremarkable. Skin:  Porokeratosis present at the distal aspect right hallux.. No infection or ulcers  Diagnosis:  Tinea unguium, Pain in right toe, pain in left toes,Asymptomatic porokeratosis right hallux.  Treatment & Plan Procedures and Treatment: Consent by patient was obtained for treatment procedures. The patient understood the discussion of treatment and procedures well. All questions were answered thoroughly reviewed. Debridement of mycotic and hypertrophic toenails, 1 through 5 bilateral and clearing of subungual debris. No ulceration, no infection noted. Debride porokeratosis under local anesthesia. Return Visit-Office Procedure: Patient instructed to return to the office for a follow up visit 3 months for continued evaluation and treatment.  Gardiner Barefoot DPM

## 2015-12-27 NOTE — Progress Notes (Deleted)
   Subjective:    Patient ID: Richard Rosales, male    DOB: [birth year], 82 y.o.   MRN: [id number]  HPI    Review of Systems  All other systems reviewed and are negative.      Objective:   Physical Exam        Assessment & Plan:

## 2016-02-05 ENCOUNTER — Other Ambulatory Visit: Payer: Self-pay | Admitting: Emergency Medicine

## 2016-02-13 IMAGING — CR DG CHEST 2V
1 series · 1 of 1 positions shown · non-contrast
Comparison: Prior radiograph from 02/07/2013

CLINICAL DATA: Fall, shortness of breath

EXAM:
CHEST  2 VIEW

[view not recorded]
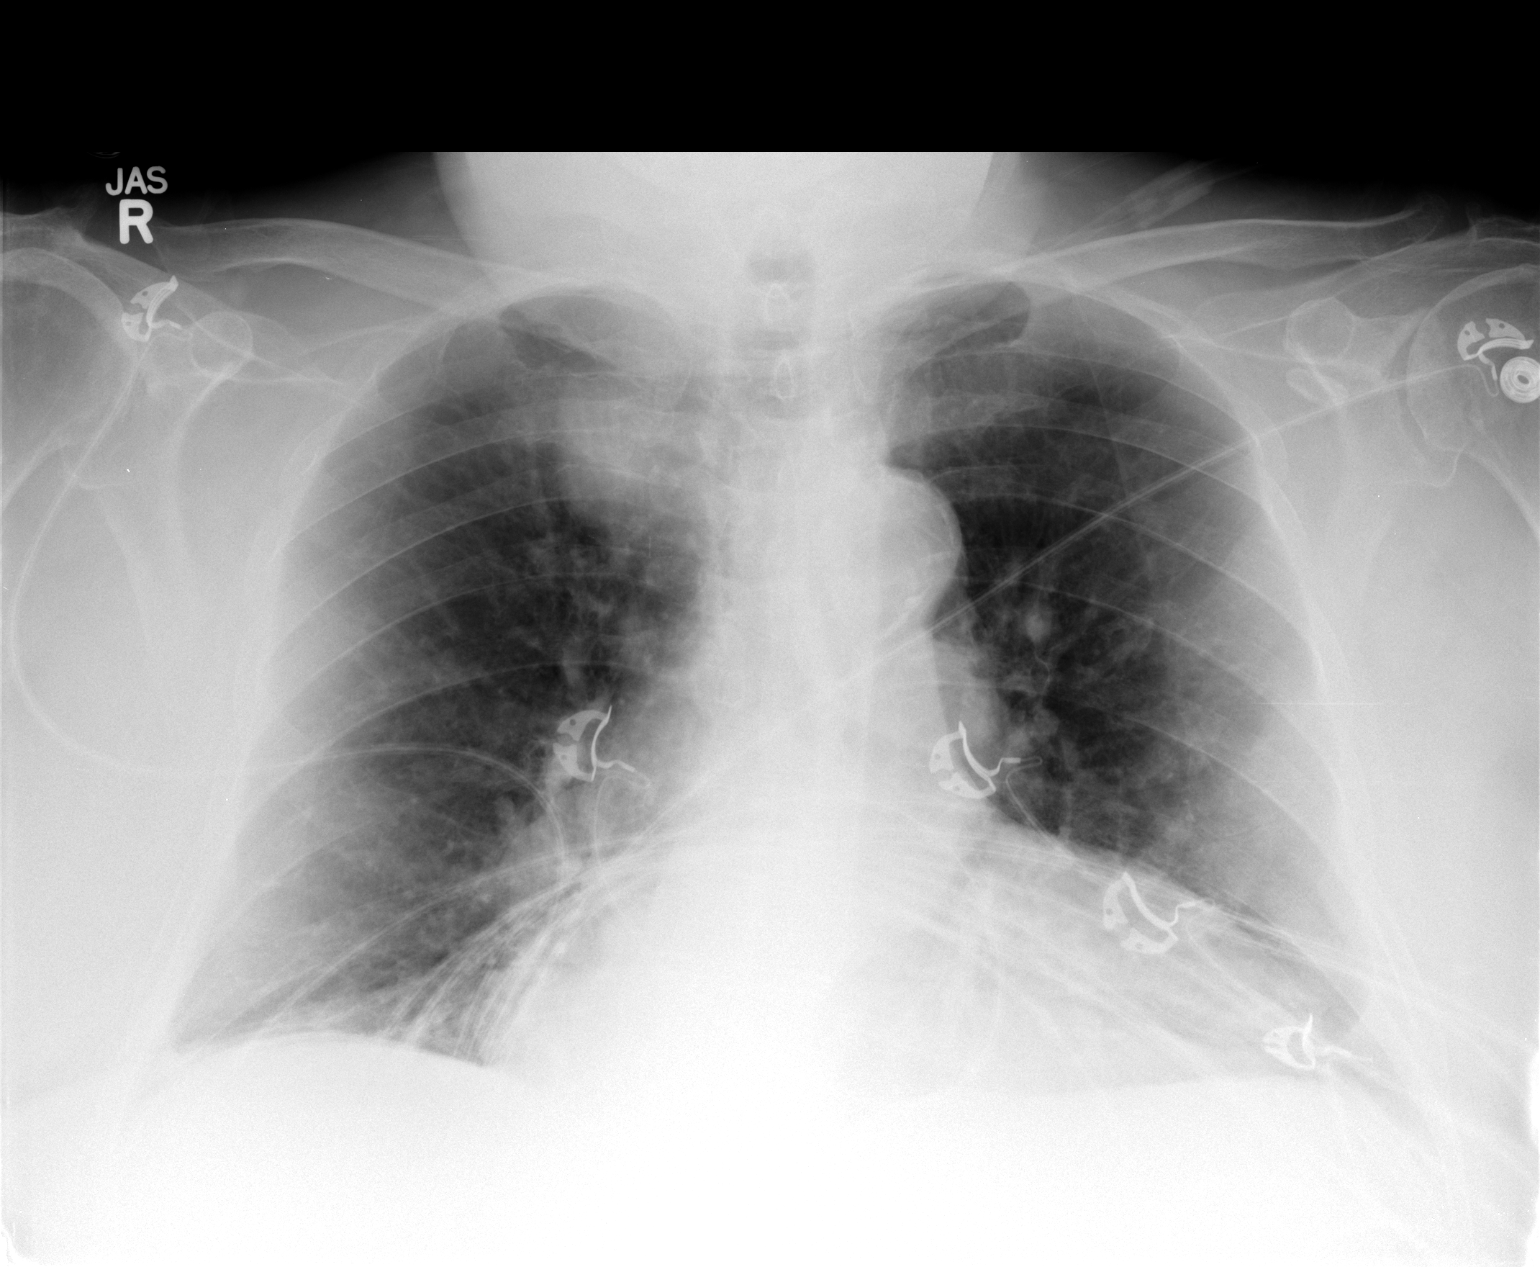

[1 of 1 positions shown; findings below may reference images not displayed]

FINDINGS: Examination is somewhat limited due to lordotic technique.
Cardiomegaly is stable as compared to prior study. Tortuosity of the
intrathoracic aorta with atherosclerotic calcifications within the
aortic arch again noted, unchanged. Prominent right paratracheal
density is unchanged dating back to multiple prior studies from
3355, likely representing ectatic arch vessel anatomy.

Lungs are mildly hypoinflated. No focal airspace consolidation
identified. No pulmonary edema or pleural effusion. No pneumothorax.

No acute osseous abnormality. Multilevel degenerative changes within
the visualized spine are not significantly changed.
IMPRESSION: 1. Stable appearance of the chest with no acute cardiopulmonary
abnormality identified.
2. Stable cardiomegaly.

## 2016-02-14 ENCOUNTER — Other Ambulatory Visit: Payer: Self-pay | Admitting: Internal Medicine

## 2016-02-29 ENCOUNTER — Other Ambulatory Visit: Payer: Self-pay | Admitting: *Deleted

## 2016-02-29 MED ORDER — LEVOTHYROXINE SODIUM 100 MCG PO TABS: 100.0000 ug | ORAL_TABLET | Freq: Every day | ORAL | 4 refills | Status: DC

## 2016-03-03 ENCOUNTER — Encounter: Payer: Self-pay | Admitting: Internal Medicine

## 2016-03-03 ENCOUNTER — Ambulatory Visit (INDEPENDENT_AMBULATORY_CARE_PROVIDER_SITE_OTHER): Payer: Medicare Other | Admitting: Internal Medicine

## 2016-03-03 VITALS — BP 130/62 | HR 60 | Temp 97.5°F | Resp 16 | Ht 67.0 in | Wt 240.8 lb

## 2016-03-03 DIAGNOSIS — E039 Hypothyroidism, unspecified: Secondary | ICD-10-CM | POA: Diagnosis not present

## 2016-03-03 DIAGNOSIS — E559 Vitamin D deficiency, unspecified: Secondary | ICD-10-CM

## 2016-03-03 DIAGNOSIS — E782 Mixed hyperlipidemia: Secondary | ICD-10-CM | POA: Diagnosis not present

## 2016-03-03 DIAGNOSIS — I10 Essential (primary) hypertension: Secondary | ICD-10-CM

## 2016-03-03 DIAGNOSIS — Z23 Encounter for immunization: Secondary | ICD-10-CM | POA: Diagnosis not present

## 2016-03-03 DIAGNOSIS — R7303 Prediabetes: Secondary | ICD-10-CM

## 2016-03-03 DIAGNOSIS — Z79899 Other long term (current) drug therapy: Secondary | ICD-10-CM

## 2016-03-03 DIAGNOSIS — I482 Chronic atrial fibrillation, unspecified: Secondary | ICD-10-CM

## 2016-03-03 DIAGNOSIS — K219 Gastro-esophageal reflux disease without esophagitis: Secondary | ICD-10-CM | POA: Diagnosis not present

## 2016-03-03 LAB — CBC WITH DIFFERENTIAL/PLATELET
BASOS ABS: 96 {cells}/uL (ref 0–200)
Basophils Relative: 1 %
EOS PCT: 9 %
Eosinophils Absolute: 864 cells/uL — ABNORMAL HIGH (ref 15–500)
HCT: 43.8 % (ref 38.5–50.0)
HEMOGLOBIN: 14.6 g/dL (ref 13.2–17.1)
LYMPHS ABS: 2016 {cells}/uL (ref 850–3900)
LYMPHS PCT: 21 %
MCH: 30.5 pg (ref 27.0–33.0)
MCHC: 33.3 g/dL (ref 32.0–36.0)
MCV: 91.4 fL (ref 80.0–100.0)
MPV: 10.6 fL (ref 7.5–12.5)
Monocytes Absolute: 1056 cells/uL — ABNORMAL HIGH (ref 200–950)
Monocytes Relative: 11 %
NEUTROS PCT: 58 %
Neutro Abs: 5568 cells/uL (ref 1500–7800)
Platelets: 227 10*3/uL (ref 140–400)
RBC: 4.79 MIL/uL (ref 4.20–5.80)
RDW: 13.3 % (ref 11.0–15.0)
WBC: 9.6 10*3/uL (ref 3.8–10.8)

## 2016-03-03 LAB — BASIC METABOLIC PANEL WITH GFR
BUN: 16 mg/dL (ref 7–25)
CALCIUM: 9.6 mg/dL (ref 8.6–10.3)
CO2: 29 mmol/L (ref 20–31)
CREATININE: 0.71 mg/dL (ref 0.70–1.11)
Chloride: 99 mmol/L (ref 98–110)
GFR, Est Non African American: 87 mL/min (ref >=60)
GLUCOSE: 92 mg/dL (ref 65–99)
Potassium: 4.4 mmol/L (ref 3.5–5.3)
SODIUM: 137 mmol/L (ref 135–146)

## 2016-03-03 LAB — MAGNESIUM: Magnesium: 2 mg/dL (ref 1.5–2.5)

## 2016-03-03 LAB — HEPATIC FUNCTION PANEL
ALT: 20 U/L (ref 9–46)
AST: 25 U/L (ref 10–35)
Albumin: 4.1 g/dL (ref 3.6–5.1)
Alkaline Phosphatase: 98 U/L (ref 40–115)
Bilirubin, Direct: 0.1 mg/dL (ref <=0.2)
Indirect Bilirubin: 0.4 mg/dL (ref 0.2–1.2)
TOTAL PROTEIN: 6.6 g/dL (ref 6.1–8.1)
Total Bilirubin: 0.5 mg/dL (ref 0.2–1.2)

## 2016-03-03 LAB — LIPID PANEL
CHOLESTEROL: 132 mg/dL (ref 125–200)
HDL: 51 mg/dL (ref >=40)
LDL CALC: 59 mg/dL (ref <130)
TRIGLYCERIDES: 112 mg/dL (ref <150)
Total CHOL/HDL Ratio: 2.6 Ratio (ref <=5.0)
VLDL: 22 mg/dL (ref <30)

## 2016-03-03 NOTE — Progress Notes (Signed)
[facility]                       Unk Pinto, [name]        Uvaldo Bristle. Silverio Lay, P.A.-C       Starlyn Skeans, P.A.-C  [facility]                [address] [place] [id number] Telephone [phone] Telefax [phone] _________________________________________     This very nice 82 y.o.MWM presents for 6 month follow up with Hypertension, COPD,  Hyperlipidemia, Pre-Diabetes and Vitamin D Deficiency.      Patient is treated for HTN & BP has been controlled at home. Today's BP: 130/62.  Patient also hs hx/o pAfib but is only on bASA 3 x/weeky since a severe Diverticular bleed. Patient has had no complaints of any cardiac type chest pain, palpitations, dyspnea/orthopnea/PND, dizziness, claudication, or dependent edema. Other problems include moderately severe COPD followe dby Dr Lamonte Sakai and Diastolic Heart failure presumed from Pulm HTN.      Hyperlipidemia is controlled with diet & meds. Patient denies myalgias or other med SE's. Last Lipids were at goal:  Lab Results  Component Value Date   CHOL 134 08/28/2015   HDL 54 08/28/2015   LDLCALC 68 08/28/2015   TRIG 61 08/28/2015   CHOLHDL 2.5 08/28/2015      Also, the patient has history of Gluttony with Morbid Obesity (BMI 37-38) and consequent T2_DM since 05/2011  and has had no symptoms of reactive hypoglycemia,  paresthesias or visual blurring.  He is not monitoring his CBG's and reports over the last month that his nocturia has increased to 4-5x, but also admits that he consumes about a liter of fluids before retiring at night and consumes water after ever "pit stop" during the night. Last A1c was not at goal:  Lab Results  Component Value Date   HGBA1C 5.8 (H) 08/28/2015      Patient has Hypothyroidism compensated since 2007. Further, the patient also has history of Vitamin D Deficiency of "18" in 2008  and supplements vitamin D without any  suspected side-effects. Last vitamin D was still relatively low:  Lab Results  Component Value Date   VD25OH 45 08/28/2015   Current Outpatient Prescriptions on File Prior to Visit  Medication Sig  . acetaminophen-codeine (TYLENOL #3) 300-30 MG per tablet Take 1 tablet by mouth every 4 (four) hours as needed for moderate pain.   Marland Kitchen albuterol (PROVENTIL HFA;VENTOLIN HFA) 108 (90 Base) MCG/ACT inhaler Inhale 2 puffs into the lungs every 6 (six) hours as needed for wheezing or shortness of breath.  Marland Kitchen albuterol (PROVENTIL) (2.5 MG/3ML) 0.083% nebulizer solution Take 3 mLs (2.5 mg total) by nebulization every 4 (four) hours.  Marland Kitchen amoxicillin (AMOXIL) 500 MG capsule as directed. (DENTAL)  . aspirin 81 MG tablet Take 1 tablet 3 days per week  . azelastine (ASTELIN) 0.1 % nasal spray Place 1 spray into both nostrils 2 (two) times daily.  Marland Kitchen b complex vitamins tablet Take 1 tablet by mouth every morning.   . budesonide-formoterol (SYMBICORT) 160-4.5 MCG/ACT inhaler Inhale 2 puffs into the lungs 2 (two) times daily.  . chlorpheniramine (CHLOR-TRIMETON) 4 MG tablet Take 1 tablet by mouth in the morning and 1 tablet at bedtime  . Cholecalciferol (VITAMIN D) 2000 UNITS CAPS Take 2 capsules by mouth  daily.   . diltiazem (DILACOR XR) 240 MG 24 hr capsule Take 1 capsule (240 mg total) by mouth 2 (two) times daily.  Marland Kitchen docusate sodium (COLACE) 100 MG capsule Take 100 mg by mouth 2 (two) times daily.    . fish oil-omega-3 fatty acids 1000 MG capsule Take 1 g by mouth 2 (two) times daily.    . furosemide (LASIX) 40 MG tablet Take 1 tablet (40 mg total) by mouth daily.  Marland Kitchen glucosamine-chondroitin 500-400 MG tablet Take 1 tablet by mouth 1 day or 1 dose.  Marland Kitchen guaifenesin (HUMIBID E) 400 MG TABS Take 400 mg by mouth 2 (two) times daily.    Marland Kitchen HYDROcodone-acetaminophen (NORCO/VICODIN) 5-325 MG per tablet Take 1 tablet by mouth every 6 (six) hours as needed for moderate pain (Only takes during CT/MRI testing).   Marland Kitchen  levothyroxine (SYNTHROID, LEVOTHROID) 100 MCG tablet Take 1 tablet (100 mcg total) by mouth daily before breakfast.  . losartan (COZAAR) 100 MG tablet TAKE 1 TABLET DAILY  . omeprazole (PRILOSEC) 40 MG capsule TAKE 1 CAPSULE TWICE A DAY  . SPIRIVA HANDIHALER 18 MCG inhalation capsule INHALE THE CONTENTS OF 1 CAPSULE DAILY   Allergies  Allergen Reactions  . Ace Inhibitors Cough  . Beta Adrenergic Blockers Other (See Comments)    bradycardia  . Decadron [Dexamethasone]   . Fluticasone-Salmeterol     Unable to remember per pt  . Hytrin [Terazosin]     Nasal congestion   . Prednisone     Shakes / tremors  . Vioxx [Rofecoxib] Other (See Comments)    dyspepsia   PMHx:   Past Medical History:  Diagnosis Date  . Acute bronchitis   . Adenomatous colon polyp   . Allergic rhinitis   . Anemia    iron deficient  . Atrial fibrillation ([employer])   . Atrial flutter ([place])   . Chronic gastritis   . COPD (chronic obstructive pulmonary disease) ([place])   . Diastolic dysfunction   . Diverticulosis of colon    recurrent GI bleeding  . DJD (degenerative joint disease)   . GERD (gastroesophageal reflux disease)   . GI bleed   . HTN (hypertension)   . Hyperplastic colon polyp 2003  . Hypothyroidism   . Moderate aortic insufficiency   . OSA (obstructive sleep apnea)    compliants w/ CPAP  . Positive PPD    remote  . Shortness of breath   . Venous insufficiency    Immunization History  Administered Date(s) Administered  . DT 07/26/2014  . DTaP 06/24/2003  . Influenza Split 04/24/2011, 03/23/2012, 03/10/2013  . Influenza Whole 03/23/2009, 03/23/2010  . Influenza, High Dose Seasonal PF 04/13/2014  . Influenza,inj,Quad PF,36+ Mos 03/01/2015  . Pneumococcal Polysaccharide-23 05/23/1994, 11/22/2001, 05/28/2010  . Tdap 06/24/2003  . Zoster 09/14/2013   Past Surgical History:  Procedure Laterality Date  . CARDIAC CATHETERIZATION  7.27.05   revealed a preserved EF with no CAD  . COLONOSCOPY     . HERNIA REPAIR Bilateral   . JOINT REPLACEMENT  2001   lt total knee-rt one done 00  . NASAL SINUS SURGERY    . POLYPECTOMY     adenomatous polyps 2010  . SHOULDER ARTHROSCOPY    . TOE SURGERY Right   . TONSILLECTOMY    . TOTAL KNEE ARTHROPLASTY     bilateral   FHx:    Reviewed / unchanged  SHx:    Reviewed / unchanged  Systems Review:  Constitutional: Denies fever, chills, wt changes,  headaches, insomnia, fatigue, night sweats, change in appetite. Eyes: Denies redness, blurred vision, diplopia, discharge, itchy, watery eyes.  ENT: Denies discharge, congestion, post nasal drip, epistaxis, sore throat, earache, hearing loss, dental pain, tinnitus, vertigo, sinus pain, snoring.  CV: Denies chest pain, palpitations, irregular heartbeat, syncope, dyspnea, diaphoresis, orthopnea, PND, claudication or edema. Respiratory: denies cough, dyspnea, DOE, pleurisy, hoarseness, laryngitis, wheezing.  Gastrointestinal: Denies dysphagia, odynophagia, heartburn, reflux, water brash, abdominal pain or cramps, nausea, vomiting, bloating, diarrhea, constipation, hematemesis, melena, hematochezia  or hemorrhoids. Genitourinary: Denies dysuria, frequency, urgency, nocturia, hesitancy, discharge, hematuria or flank pain. Musculoskeletal: Denies arthralgias, myalgias, stiffness, jt. swelling, pain, limping or strain/sprain.  Skin: Denies pruritus, rash, hives, warts, acne, eczema or change in skin lesion(s). Neuro: No weakness, tremor, incoordination, spasms, paresthesia or pain. Psychiatric: Denies confusion, memory loss or sensory loss. Endo: Denies change in weight, skin or hair change.  Heme/Lymph: No excessive bleeding, bruising or enlarged lymph nodes.  Physical Exam BP 130/62   P 60   T 97.5 F    R 16   Ht 5\' 7"     Wt 240 lb 12.8 oz    BMI 37.71  O2 sat 95-96% at rest and drops to 89% walking about 25 feet  Appears over nourished morbidly obese and in no distress.  Eyes: PERRLA, EOMs,  conjunctiva no swelling or erythema. Sinuses: No frontal/maxillary tenderness ENT/Mouth: EAC's clear, TM's nl w/o erythema, bulging. Nares clear w/o erythema, swelling, exudates. Oropharynx clear without erythema or exudates. Oral hygiene is good. Tongue normal, non obstructing. Hearing intact.  Neck: Supple. Thyroid nl. Car 2+/2+ without bruits, nodes or JVD. Chest: Respirations nl with BS clear & equal w/o rales, rhonchi, wheezing or stridor.  Cor: Heart sounds normal w/ regular rate and rhythm without sig. murmurs, gallops, clicks, or rubs. Pedal pulses 1+ and obscured by 1-2+ ankle edema.  Abdomen: Soft, Rotund & bowel sounds normal. Non-tender w/o guarding, rebound, hernias, masses, or organomegaly.  Lymphatics: Unremarkable.  Musculoskeletal: Full ROM all peripheral extremities, joint stability, 5/5 strength, and normal gait.  Skin: Warm, dry without exposed rashes, lesions or ecchymosis apparent.  Neuro: Cranial nerves intact, reflexes equal bilaterally. Sensory-motor testing grossly intact. Tendon reflexes grossly intact.  Pysch: Alert & oriented x 3.  Insight and judgement nl & appropriate. No ideations.  Assessment and Plan:  1. Essential hypertension  - Continue medication, monitor blood pressure at home. Continue DASH diet. Reminder to go to the ER if any CP, SOB, nausea, dizziness, severe HA, changes vision/speech, left arm numbness and tingling and jaw pain. - TSH  2. Mixed hyperlipidemia  - Continue diet/meds, exercise,& lifestyle modifications. Continue monitor periodic cholesterol/liver & renal functions   - Lipid panel - TSH  3. Prediabetes  - Continue diet, exercise, lifestyle modifications. Monitor appropriate labs. - Hemoglobin A1c - Insulin, random  4. Vitamin D deficiency  - Continue supplementation. - VITAMIN D 25 Hydroxy   5. Gastroesophageal reflux disease,   6. Hypothyroidism, unspecified hypothyroidism type   7. Chronic atrial fibrillation  (HCC)   8. Medication management  - CBC with Differential/Platelet - BASIC METABOLIC PANEL WITH GFR - Hepatic function panel - Magnesium  9. Need for prophylactic vaccination and inoculation against influenza  - Flu vaccine HIGH DOSE PF (Fluzone High dose)   Recommended regular exercise, BP monitoring, weight control, and discussed med and SE's. Recommended labs to assess and monitor clinical status. Further disposition pending results of labs. Over 30 minutes of exam, counseling, chart review was performed

## 2016-03-03 NOTE — Patient Instructions (Addendum)

## 2016-03-04 LAB — TSH: TSH: 3.61 mIU/L (ref 0.40–4.50)

## 2016-03-04 LAB — HEMOGLOBIN A1C
HEMOGLOBIN A1C: 5.4 % (ref <5.7)
MEAN PLASMA GLUCOSE: 108 mg/dL

## 2016-03-04 LAB — INSULIN, RANDOM: INSULIN: 6.2 u[IU]/mL (ref 2.0–19.6)

## 2016-03-04 LAB — VITAMIN D 25 HYDROXY (VIT D DEFICIENCY, FRACTURES): Vit D, 25-Hydroxy: 50 ng/mL (ref 30–100)

## 2016-03-05 ENCOUNTER — Ambulatory Visit: Payer: Medicare Other | Admitting: Podiatry

## 2016-03-18 ENCOUNTER — Ambulatory Visit: Payer: Medicare Other | Admitting: Emergency Medicine

## 2016-03-29 ENCOUNTER — Other Ambulatory Visit: Payer: Self-pay | Admitting: Physician Assistant

## 2016-04-12 ENCOUNTER — Other Ambulatory Visit: Payer: Self-pay | Admitting: Internal Medicine

## 2016-04-12 ENCOUNTER — Emergency Department (HOSPITAL_BASED_OUTPATIENT_CLINIC_OR_DEPARTMENT_OTHER)
Admission: EM | Admit: 2016-04-12 | Discharge: 2016-04-12 | Disposition: A | Discharge status: Home / Self Care | Payer: Medicare Other | Attending: Emergency Medicine | Admitting: Emergency Medicine

## 2016-04-12 ENCOUNTER — Encounter (HOSPITAL_BASED_OUTPATIENT_CLINIC_OR_DEPARTMENT_OTHER): Payer: Self-pay | Admitting: Emergency Medicine

## 2016-04-12 ENCOUNTER — Emergency Department (HOSPITAL_BASED_OUTPATIENT_CLINIC_OR_DEPARTMENT_OTHER): Payer: Medicare Other

## 2016-04-12 DIAGNOSIS — I5032 Chronic diastolic (congestive) heart failure: Secondary | ICD-10-CM | POA: Insufficient documentation

## 2016-04-12 DIAGNOSIS — J40 Bronchitis, not specified as acute or chronic: Secondary | ICD-10-CM | POA: Diagnosis not present

## 2016-04-12 DIAGNOSIS — E039 Hypothyroidism, unspecified: Secondary | ICD-10-CM | POA: Insufficient documentation

## 2016-04-12 DIAGNOSIS — Z79899 Other long term (current) drug therapy: Secondary | ICD-10-CM | POA: Insufficient documentation

## 2016-04-12 DIAGNOSIS — L03119 Cellulitis of unspecified part of limb: Secondary | ICD-10-CM | POA: Diagnosis not present

## 2016-04-12 DIAGNOSIS — I110 Hypertensive heart disease with heart failure: Secondary | ICD-10-CM | POA: Insufficient documentation

## 2016-04-12 DIAGNOSIS — Z87891 Personal history of nicotine dependence: Secondary | ICD-10-CM | POA: Diagnosis not present

## 2016-04-12 DIAGNOSIS — Z7982 Long term (current) use of aspirin: Secondary | ICD-10-CM | POA: Insufficient documentation

## 2016-04-12 DIAGNOSIS — L03115 Cellulitis of right lower limb: Secondary | ICD-10-CM | POA: Insufficient documentation

## 2016-04-12 DIAGNOSIS — R05 Cough: Secondary | ICD-10-CM | POA: Diagnosis not present

## 2016-04-12 DIAGNOSIS — J449 Chronic obstructive pulmonary disease, unspecified: Secondary | ICD-10-CM | POA: Diagnosis not present

## 2016-04-12 LAB — CBC WITH DIFFERENTIAL/PLATELET
BASOS ABS: 0 10*3/uL (ref 0.0–0.1)
BASOS PCT: 1 %
Eosinophils Absolute: 0.5 10*3/uL (ref 0.0–0.7)
Eosinophils Relative: 7 %
HEMATOCRIT: 39.4 % (ref 39.0–52.0)
HEMOGLOBIN: 13 g/dL (ref 13.0–17.0)
LYMPHS PCT: 15 %
Lymphs Abs: 1.2 10*3/uL (ref 0.7–4.0)
MCH: 31.6 pg (ref 26.0–34.0)
MCHC: 33 g/dL (ref 30.0–36.0)
MCV: 95.6 fL (ref 78.0–100.0)
MONO ABS: 0.8 10*3/uL (ref 0.1–1.0)
MONOS PCT: 10 %
NEUTROS ABS: 5.2 10*3/uL (ref 1.7–7.7)
NEUTROS PCT: 67 %
Platelets: 189 10*3/uL (ref 150–400)
RBC: 4.12 MIL/uL — ABNORMAL LOW (ref 4.22–5.81)
RDW: 13.5 % (ref 11.5–15.5)
WBC: 7.7 10*3/uL (ref 4.0–10.5)

## 2016-04-12 LAB — BASIC METABOLIC PANEL
ANION GAP: 6 (ref 5–15)
BUN: 17 mg/dL (ref 6–20)
CALCIUM: 9 mg/dL (ref 8.9–10.3)
CHLORIDE: 104 mmol/L (ref 101–111)
CO2: 27 mmol/L (ref 22–32)
Creatinine, Ser: 0.7 mg/dL (ref 0.61–1.24)
GFR calc non Af Amer: >60 mL/min (ref >60)
GLUCOSE: 97 mg/dL (ref 65–99)
Potassium: 4.1 mmol/L (ref 3.5–5.1)
Sodium: 137 mmol/L (ref 135–145)

## 2016-04-12 MED ORDER — DOXYCYCLINE HYCLATE 100 MG PO CAPS: 100.0000 mg | ORAL_CAPSULE | Freq: Two times a day (BID) | ORAL | 0 refills | Status: DC

## 2016-04-12 NOTE — ED Triage Notes (Signed)
Pt reports cellulitis to right shin, went to [facility] and [facility] sent pt here.  Pt problem started 10 days ago.

## 2016-04-12 NOTE — ED Notes (Signed)
MD at bedside. 

## 2016-04-12 NOTE — ED Provider Notes (Signed)
[facility] Provider Note   CSN: [id number] Arrival date & time: 04/12/16  1539  By signing my name below, I, Neta Mends, attest that this documentation has been prepared under the direction and in the presence of Davonna Belling, MD . Electronically Signed: Neta Mends, ED Scribe. 04/12/2016. 3:58 PM.    History   Chief Complaint Chief Complaint  Patient presents with  . Cellulitis    The history is provided by the patient. No language interpreter was used.   HPI Comments:  Richard Rosales is a 82 y.o. male with PMHx of AFib, HTN, leg swelling, and COPD who presents to the Emergency Department complaining of worsening cellulitis to the right shin x 10 days. Pt reports less severe cellulitis to his left shin. Pt states that he has chronic congestion and cough due to COPD. Pt reports a PSHx of cardiac catheterization. Pt currently takes aspirin daily. Pt reports a known allergy to prednisone. Pt's daughter states that pt normally uses NCO2 at home. No alleviating factors noted. Pt denies itching, burning, fever chills, SOB.    Past Medical History:  Diagnosis Date  . Acute bronchitis   . Adenomatous colon polyp   . Allergic rhinitis   . Anemia    iron deficient  . Atrial fibrillation ([place])   . Atrial flutter ([place])   . Chronic gastritis   . COPD (chronic obstructive pulmonary disease) ([place])   . Diastolic dysfunction   . Diverticulosis of colon    recurrent GI bleeding  . DJD (degenerative joint disease)   . GERD (gastroesophageal reflux disease)   . GI bleed   . HTN (hypertension)   . Hyperplastic colon polyp 2003  . Hypothyroidism   . Moderate aortic insufficiency   . OSA (obstructive sleep apnea)    compliants w/ CPAP  . Positive PPD    remote  . Shortness of breath   . Venous insufficiency     Patient Active Problem List   Diagnosis Date Noted  . BMI 37.0-37.9, adult 05/08/2015  . Morbid obesity (BMI 37.84) 02/19/2015  . Medicare  annual wellness visit, subsequent 02/19/2015  . Hypertensive cardiovascular disease 03/06/2014  . Mixed hyperlipidemia 12/15/2013  . Prediabetes 12/15/2013  . Vitamin D deficiency 12/15/2013  . Medication management 12/15/2013  . DJD (degenerative joint disease)   . Allergic rhinitis   . Hypothyroidism   . Obesity, morbid ([place]) 06/13/2013  . Rotator cuff tear, right 05/08/2011  . COPD (chronic obstructive pulmonary disease) ([place]) 11/01/2009  . Obstructive sleep apnea 10/04/2009  . Chronic diastolic heart failure ([place]) 10/04/2009  . Diverticulosis of colon with hemorrhage 09/10/2009  . Personal history of colonic polyps 09/10/2009  . Atrial fibrillation ([place]) 09/11/2008  . Essential hypertension 09/08/2008  . GERD 09/08/2008  . BENIGN PROSTATIC HYPERTROPHY, HX OF 09/08/2008    Past Surgical History:  Procedure Laterality Date  . CARDIAC CATHETERIZATION  7.27.05   revealed a preserved EF with no CAD  . COLONOSCOPY    . HERNIA REPAIR Bilateral   . JOINT REPLACEMENT  2001   lt total knee-rt one done 00  . NASAL SINUS SURGERY    . POLYPECTOMY     adenomatous polyps 2010  . SHOULDER ARTHROSCOPY    . TOE SURGERY Right   . TONSILLECTOMY    . TOTAL KNEE ARTHROPLASTY     bilateral       Home Medications    Prior to Admission medications   Medication Sig Start Date End  Date Taking? Authorizing Provider  acetaminophen-codeine (TYLENOL #3) 300-30 MG per tablet Take 1 tablet by mouth every 4 (four) hours as needed for moderate pain.     Historical Provider, MD  albuterol (PROVENTIL HFA;VENTOLIN HFA) 108 (90 Base) MCG/ACT inhaler Inhale 2 puffs into the lungs every 6 (six) hours as needed for wheezing or shortness of breath. 08/21/15   Collene Gobble, MD  albuterol (PROVENTIL) (2.5 MG/3ML) 0.083% nebulizer solution Take 3 mLs (2.5 mg total) by nebulization every 4 (four) hours. 06/28/14   Collene Gobble, MD  amoxicillin (AMOXIL) 500 MG capsule as directed. (DENTAL) 10/25/14   Historical  Provider, MD  aspirin 81 MG tablet Take 1 tablet 3 days per week    Historical Provider, MD  azelastine (ASTELIN) 0.1 % nasal spray Place 1 spray into both nostrils 2 (two) times daily. 12/10/15   Collene Gobble, MD  b complex vitamins tablet Take 1 tablet by mouth every morning.     Historical Provider, MD  budesonide-formoterol (SYMBICORT) 160-4.5 MCG/ACT inhaler Inhale 2 puffs into the lungs 2 (two) times daily. 12/10/15   Collene Gobble, MD  chlorpheniramine (CHLOR-TRIMETON) 4 MG tablet Take 1 tablet by mouth in the morning and 1 tablet at bedtime    Historical Provider, MD  Cholecalciferol (VITAMIN D) 2000 UNITS CAPS Take 2 capsules by mouth daily.     Historical Provider, MD  diltiazem (DILACOR XR) 240 MG 24 hr capsule Take 1 capsule (240 mg total) by mouth 2 (two) times daily. 05/24/15   Thompson Grayer, MD  docusate sodium (COLACE) 100 MG capsule Take 100 mg by mouth 2 (two) times daily.      Historical Provider, MD  doxycycline (VIBRAMYCIN) 100 MG capsule Take 1 capsule (100 mg total) by mouth 2 (two) times daily. 04/12/16   Davonna Belling, MD  fish oil-omega-3 fatty acids 1000 MG capsule Take 1 g by mouth 2 (two) times daily.      Historical Provider, MD  furosemide (LASIX) 40 MG tablet Take 1 tablet (40 mg total) by mouth daily. 10/30/14   Unk Pinto, MD  glucosamine-chondroitin 500-400 MG tablet Take 1 tablet by mouth 1 day or 1 dose.    Historical Provider, MD  guaifenesin (HUMIBID E) 400 MG TABS Take 400 mg by mouth 2 (two) times daily.      Historical Provider, MD  HYDROcodone-acetaminophen (NORCO/VICODIN) 5-325 MG per tablet Take 1 tablet by mouth every 6 (six) hours as needed for moderate pain (Only takes during CT/MRI testing).  11/18/13   Kelby Aline, PA-C  levothyroxine (SYNTHROID, LEVOTHROID) 100 MCG tablet Take 1 tablet (100 mcg total) by mouth daily before breakfast. 02/29/16   Unk Pinto, MD  losartan (COZAAR) 100 MG tablet TAKE 1 TABLET DAILY 03/30/16   Unk Pinto, MD    omeprazole (PRILOSEC) 40 MG capsule TAKE 1 CAPSULE TWICE A DAY 02/14/16   Vicie Mutters, PA-C  SPIRIVA HANDIHALER 18 MCG inhalation capsule INHALE THE CONTENTS OF 1 CAPSULE DAILY 02/06/16   Collene Gobble, MD    Family History Family History  Problem Relation Age of Onset  . Hypertension Mother   . Diabetes Father   . Lung cancer Brother     chemical    Social History Social History  Substance Use Topics  . Smoking status: Former Smoker    Packs/day: 1.00    Years: 20.00    Types: Cigarettes    Quit date: 08/01/1978  . Smokeless tobacco: Never Used  . Alcohol  use 0.0 oz/week     Comment: small glass of wine daily     Allergies   Ace inhibitors; Beta adrenergic blockers; Decadron [dexamethasone]; Fluticasone-salmeterol; Hytrin [terazosin]; Prednisone; and Vioxx [rofecoxib]   Review of Systems Review of Systems  Constitutional: Negative for chills and fever.  HENT: Positive for congestion.   Respiratory: Positive for cough. Negative for shortness of breath.   Skin: Positive for rash and wound.  All other systems reviewed and are negative.    Physical Exam Updated Vital Signs BP 141/92 (BP Location: Left Arm)   Pulse 71   Temp 98.9 F (37.2 C) (Oral)   Resp 20   Ht 5\' 9"  (1.753 m)   Wt 239 lb (108.4 kg)   SpO2 94%   BMI 35.29 kg/m   Physical Exam  Constitutional: He appears well-developed and well-nourished.        HENT:  Head: Normocephalic and atraumatic.  Eyes: Conjunctivae are normal.  Neck: Neck supple.  Cardiovascular: Normal rate, regular rhythm, normal heart sounds and intact distal pulses.   No murmur heard. Palpable DP pulse on right foot  Pulmonary/Chest: Effort normal and breath sounds normal. No respiratory distress.  Mildly harsh breath sounds.  Abdominal: Soft. There is no tenderness.  Musculoskeletal: He exhibits edema. He exhibits no tenderness.  Moderate pitting edema to bilateral lower legs. No calf tenderness.  Neurological: He  is alert.  Skin: Skin is warm and dry.  15cm indurated area to anterior aspect of right lower leg with no fluctuance.  Psychiatric: He has a normal mood and affect.  Nursing note and vitals reviewed.      ED Treatments / Results  DIAGNOSTIC STUDIES:  Oxygen Saturation is 100% on RA, normal by my interpretation.    COORDINATION OF CARE:  3:58 PM Discussed treatment plan with pt at bedside and pt agreed to plan.   Labs (all labs ordered are listed, but only abnormal results are displayed) Labs Reviewed  CBC WITH DIFFERENTIAL/PLATELET - Abnormal; Notable for the following:       Result Value   RBC 4.12 (*)    All other components within normal limits  BASIC METABOLIC PANEL    EKG  EKG Interpretation None       Radiology Dg Chest 2 View  Result Date: 04/12/2016 CLINICAL DATA:  82 year old male with history of cellulitis on the right shin 10 days ago, presenting with chronic congestion and cough. History of COPD. EXAM: CHEST  2 VIEW COMPARISON:  Chest x-ray 11/17/2013. FINDINGS: Diffuse peribronchial cuffing. Interstitial prominence throughout the mid to lower lungs bilaterally. No confluent consolidative airspace disease. No pleural effusions. No evidence of pulmonary edema. Heart size appears mildly enlarged. Upper mediastinal contours are again remarkable for unusual contour in the upper right mediastinum, which corresponds to a prominent vascular pedicle on prior chest CT 11/18/2013 and is stable compared to chest radiographs of that time. Aortic atherosclerosis. IMPRESSION: 1. Diffuse peribronchial cuffing and interstitial prominence concerning for acute bronchitis, with potential developing multilobar mid to lower. Lung bronchopneumonia Followup PA and lateral chest X-ray is recommended in 3-4 weeks following trial of antibiotic therapy to ensure resolution and exclude underlying malignancy. 2. Mild cardiomegaly. 3. Aortic atherosclerosis. Electronically Signed   By: Vinnie Langton [name]   On: 04/12/2016 16:30    Procedures Procedures (including critical care time)  Medications Ordered in ED Medications - No data to display   Initial Impression / Assessment and Plan / ED Course  I have reviewed the  triage vital signs and the nursing notes.  Pertinent labs & imaging results that were available during my care of the patient were reviewed by me and considered in my medical decision making (see chart for details).  Clinical Course     Patient with cellulitis of right lower leg. No apparent abscess. Labs overall reassuring. X-ray done due to cough and showed bronchitis and possible pneumonia. Will treat with doxycycline to cover both pulmonary and skin. Discharge home to follow-up with PCP in 2 days. Will return sooner if worsening symptoms. Final Clinical Impressions(s) / ED Diagnoses   Final diagnoses:  Cellulitis of right lower leg  Bronchitis    New Prescriptions New Prescriptions   DOXYCYCLINE (VIBRAMYCIN) 100 MG CAPSULE    Take 1 capsule (100 mg total) by mouth 2 (two) times daily.  I personally performed the services described in this documentation, which was scribed in my presence. The recorded information has been reviewed and is accurate.       Davonna Belling, MD 04/12/16 [phone]

## 2016-04-15 ENCOUNTER — Encounter: Payer: Self-pay | Admitting: Internal Medicine

## 2016-04-15 ENCOUNTER — Ambulatory Visit (INDEPENDENT_AMBULATORY_CARE_PROVIDER_SITE_OTHER): Payer: Medicare Other | Admitting: Internal Medicine

## 2016-04-15 VITALS — BP 148/76 | HR 56 | Temp 97.3°F | Resp 16 | Ht 67.0 in | Wt 245.6 lb

## 2016-04-15 DIAGNOSIS — I872 Venous insufficiency (chronic) (peripheral): Secondary | ICD-10-CM

## 2016-04-15 DIAGNOSIS — I10 Essential (primary) hypertension: Secondary | ICD-10-CM | POA: Diagnosis not present

## 2016-04-15 DIAGNOSIS — L03115 Cellulitis of right lower limb: Secondary | ICD-10-CM | POA: Diagnosis not present

## 2016-04-15 NOTE — Progress Notes (Signed)
[facility]   Unk Pinto, [name]    Uvaldo Bristle. Silverio Lay, P.A.-C      Starlyn Skeans, P.A.-C  [facility]                [address] [place], [place] [id number] Telephone [phone] Telefax [phone] Subjective:    Patient ID: Richard Rosales, male    DOB: 1930/11/08, 82 y.o.   MRN: [id number]  HPI This nice 82 yo sedentary Morbidly Obese MWM with HTN, COPD, preDM, and severe venous insufficiency was dx'd / Tx'd 3 days ago at an urgent care for stasis cellulitis with Doxycycline. CXR was done and equivocal for a PNA. Patient denies any pulmonary sx's altho does useO2 @ night.   Medication Sig  . acetaminophen-codeine (TYLENOL #3) 300-30 MG per tablet Take 1 tablet by mouth every 4 (four) hours as needed for moderate pain.   Marland Kitchen albuterol (PROVENTIL HFA;VENTOLIN HFA) 108 (90 Base) MCG/ACT inhaler Inhale 2 puffs into the lungs every 6 (six) hours as needed for wheezing or shortness of breath.  Marland Kitchen albuterol (PROVENTIL) (2.5 MG/3ML) 0.083% nebulizer solution Take 3 mLs (2.5 mg total) by nebulization every 4 (four) hours.  Marland Kitchen amoxicillin (AMOXIL) 500 MG capsule as directed. (DENTAL)  . aspirin 81 MG tablet Take 1 tablet 3 days per week  . azelastine (ASTELIN) 0.1 % nasal spray Place 1 spray into both nostrils 2 (two) times daily.  Marland Kitchen b complex vitamins tablet Take 1 tablet by mouth every morning.   . budesonide-formoterol (SYMBICORT) 160-4.5 MCG/ACT inhaler Inhale 2 puffs into the lungs 2 (two) times daily.  . chlorpheniramine (CHLOR-TRIMETON) 4 MG tablet Take 1 tablet by mouth in the morning and 1 tablet at bedtime  . Cholecalciferol (VITAMIN D) 2000 UNITS CAPS Take 2 capsules by mouth daily.   Marland Kitchen diltiazem (DILACOR XR) 240 MG 24 hr capsule Take 1 capsule (240 mg total) by mouth 2 (two) times daily.  Marland Kitchen docusate sodium (COLACE) 100 MG capsule Take 100 mg by mouth 2 (two) times daily.    Marland Kitchen doxycycline  (VIBRAMYCIN) 100 MG capsule Take 1 capsule (100 mg total) by mouth 2 (two) times daily.  . fish oil-omega-3 fatty acids 1000 MG capsule Take 1 g by mouth 2 (two) times daily.    . furosemide (LASIX) 40 MG tablet Take 1 tablet (40 mg total) by mouth daily.  Marland Kitchen glucosamine-chondroitin 500-400 MG tablet Take 1 tablet by mouth 1 day or 1 dose.  Marland Kitchen guaifenesin (HUMIBID E) 400 MG TABS Take 400 mg by mouth 2 (two) times daily.    Marland Kitchen HYDROcodone-acetaminophen (NORCO/VICODIN) 5-325 MG per tablet Take 1 tablet by mouth every 6 (six) hours as needed for moderate pain (Only takes during CT/MRI testing).   Marland Kitchen levothyroxine (SYNTHROID, LEVOTHROID) 100 MCG tablet Take 1 tablet (100 mcg total) by mouth daily before breakfast.  . losartan (COZAAR) 100 MG tablet TAKE 1 TABLET DAILY  . omeprazole (PRILOSEC) 40 MG capsule TAKE 1 CAPSULE TWICE A DAY  . SPIRIVA HANDIHALER 18 MCG inhalation capsule INHALE THE CONTENTS OF 1 CAPSULE DAILY   Allergies  Allergen Reactions  . Ace Inhibitors Cough  . Beta Adrenergic Blockers Other (See Comments)    bradycardia  . Decadron [Dexamethasone]   . Fluticasone-Salmeterol     Unable to remember per pt  . Hytrin [Terazosin]     Nasal congestion   .  Prednisone     Shakes / tremors  . Vioxx [Rofecoxib] Other (See Comments)    dyspepsia   Past Medical History:  Diagnosis Date  . Acute bronchitis   . Adenomatous colon polyp   . Allergic rhinitis   . Anemia    iron deficient  . Atrial fibrillation ([place])   . Atrial flutter ([place])   . Chronic gastritis   . COPD (chronic obstructive pulmonary disease) ([place])   . Diastolic dysfunction   . Diverticulosis of colon    recurrent GI bleeding  . DJD (degenerative joint disease)   . GERD (gastroesophageal reflux disease)   . GI bleed   . HTN (hypertension)   . Hyperplastic colon polyp 2003  . Hypothyroidism   . Moderate aortic insufficiency   . OSA (obstructive sleep apnea)    compliants w/ CPAP  . Positive PPD    remote  .  Shortness of breath   . Venous insufficiency    Review of Systems  10 point systems review negative except as above.    Objective:   Physical Exam  BP (!) 148/76   Pulse (!) 56   Temp 97.3 F (36.3 C)   Resp 16   Ht 5\' 7"  (1.702 m)   Wt 245 lb 9.6 oz (111.4 kg)   BMI 38.47 kg/m   HEENT - Eac's patent. TM's Nl. EOM's full. PERRLA. NasoOroPharynx clear. Neck - supple. Nl Thyroid. Carotids 2+ & No bruits, nodes, JVD Chest - BS very distant w/o Rales, rhonchi, wheezes. Cor - Nl HS. RRR w/o sig MGR. PP obscured by edema. Has 3-4 (+) pretibial edema to the knees bilaterally Abd - Soft, Rotund. MS- FROM w/o deformities. Muscle power, tone and bulk Nl. Gait supported with a walker. Has moderate difficulty arising for a chair. Marland Kitchen Neuro - No obvious Cr N abnormalities. Sensory, motor and  Nl w/o focal abnormalities. Skin- Stasis pigmentation of the shin with cellulitic changes of the Rt ant shin.     Assessment & Plan:   1. Essential hypertension   2. Venous (peripheral) insufficiency  - recc increase lasix 40 mg to bid or 2 tabs qd and monitor daily weights  3. Venous stasis dermatitis of right lower extremity   4. Cellulitis of leg, right Continue Doxycycline 8 more days to complete 10 day course and ROV at that time for recheck and BMET Over 20 minutes of exam, counseling, chart review anddecision making was performed

## 2016-04-17 ENCOUNTER — Ambulatory Visit (INDEPENDENT_AMBULATORY_CARE_PROVIDER_SITE_OTHER): Payer: Medicare Other | Admitting: Podiatry

## 2016-04-17 ENCOUNTER — Encounter: Payer: Self-pay | Admitting: Podiatry

## 2016-04-17 VITALS — Ht 67.0 in | Wt 245.0 lb

## 2016-04-17 DIAGNOSIS — L03031 Cellulitis of right toe: Secondary | ICD-10-CM

## 2016-04-17 NOTE — Progress Notes (Signed)
Patient ID: JEDEDIAH AL, male   DOB: 07-Sep-1930, 82 y.o.   MRN: [id number] Complaint:  Visit Type: Patient returns to my office for continued preventative foot care services. Complaint: Patient states" my nails have grown long and thick and become painful to walk and wear shoes".  He also has painful callus on the tip of his big toe right foot. Patient has been diagnosed with DM with no complications. He presents for preventative foot care services. No changes to ROS  Podiatric Exam: Vascular: dorsalis pedis and posterior tibial pulses are absent bilateral. Capillary return is immediate. Temperature gradient is decreased.. Skin turgor WNL  Sensorium: Normal Semmes Weinstein monofilament test. Normal tactile sensation bilaterally. Nail Exam: Pt has thick disfigured discolored nails with subungual debris noted bilateral entire nail hallux through fifth toenails.  His right great toe has granulation tissue noted along the lateral nail groove. There is necrotic tissue noted at the nail groove and the nail of the right hallux. There is redness noted around the distal aspect of the right hallux nail Ulcer Exam: There is no evidence of ulcer or pre-ulcerative changes or infection. Orthopedic Exam: Muscle tone and strength are WNL. No limitations in general ROM. No crepitus or effusions noted. Foot type and digits show no abnormalities. Bony prominences are unremarkable. Skin:  Porokeratosis present at the distal aspect right hallux.. No infection or ulcers  Diagnosis:  Tinea unguium, Pain in right toe, pain in left toe.  Paronychia right hallux nail.  Treatment & Plan Procedures and Treatment: Consent by patient was obtained for treatment procedures. The patient understood the discussion of treatment and procedures well. All questions were answered thoroughly reviewed. Debridement of mycotic and hypertrophic toenails, 1 through 5 bilateral and clearing of subungual debris. No ulceration, no infection  noted.  Debride necrotic tissue right hallux at site of nail bed.  Patient is already taking amoxicillin.  Dispense toe cap right hallux. Return Visit-Office Procedure: Patient instructed to return to the office for a follow up visit 3 months for continued evaluation and treatment.  Gardiner Barefoot DPM

## 2016-04-22 ENCOUNTER — Encounter: Payer: Self-pay | Admitting: Emergency Medicine

## 2016-04-22 ENCOUNTER — Ambulatory Visit (INDEPENDENT_AMBULATORY_CARE_PROVIDER_SITE_OTHER): Payer: Medicare Other | Admitting: Emergency Medicine

## 2016-04-22 ENCOUNTER — Ambulatory Visit: Payer: Self-pay | Admitting: Internal Medicine

## 2016-04-22 DIAGNOSIS — J301 Allergic rhinitis due to pollen: Secondary | ICD-10-CM

## 2016-04-22 DIAGNOSIS — G4733 Obstructive sleep apnea (adult) (pediatric): Secondary | ICD-10-CM

## 2016-04-22 DIAGNOSIS — J449 Chronic obstructive pulmonary disease, unspecified: Secondary | ICD-10-CM

## 2016-04-22 NOTE — Assessment & Plan Note (Signed)
Please continue your Spiriva and Symbicort as you are taking them  Take albuterol 2 puffs up to every 4 hours if needed for shortness of breath.  Continue your exercise routine.  Continue your CPAP every night Follow with Dr Lamonte Sakai in 6 months or sooner if you have any problems.

## 2016-04-22 NOTE — Progress Notes (Signed)
Subjective:  Patient ID: Richard Rosales, male    DOB: [birth year], 82 y.o.   MRN: [id number]  82 yo man, former smoker (quit'80)  For FU of COPD &  OSA on CPAP 12 cm. He has  hx of HTN, A Fib, chronic diastolic dysfxn and LE edema, GERD, diverticulosis, remote positive PPD.    ROV 06/27/14 -- hx of Severe COPD with associated chronic bronchitis that frequently exacerbates. He has OSA and is compliant with CPAP. Last time he had sx consistent with acute bronchitis, was treated with pred + clarithro. He has dyspnea with activity, chores that requires him to rest. He is still coughing, gets a hoarse voice, has to clear secretions every am. He had severe mood swings with prednisone.   ROV 09/07/14 -- follow-up visit for Severe COPD with associated chronic bronchitis that frequently exacerbates. He has OSA and is compliant with CPAP. He was treated with levaquin and medrol beginning 09/05/14 - he has had more cough and sputum for the last 10-14 days. Since starting the levaquin and medrol he is doing a bit better, still with DOE and wheeze  ROV 03/01/15 -- follow-up visit for severe COPD, chronic bronchitis, obstructive sleep apnea on CPAP. He last saw TP in May.  He has oxygen that he uses with most activity, uses at home. He had a portable concentrator but didn't like it as much as the tanks. He is on symbicort and spiriva. Breathing appears to be stable at this time. Uses albuterol very rarely.   ROV 08/21/15 -- she has a history of severe COPD with chronic bronchitic phenotype. He also has OSA on CPAP. He is currently managed on Spiriva plus Symbicort. He is also using Astelin nasal spray qhs, Prilosec, Chlor-Trimeton, Humibid. He tells me that he had a recent URI 1 month ago, possibly the flu. He was treated with Tamiflu. Was not treated with pred or abx. He was told recently that he had parkinsonism, tremor. Is following w neuro. Has daily cough, secretions especially in the am. Has seen a streak of blood  before x 1.   ROV 11/08/15 -- follow-up visit for history of obstructive sleep apnea. Severe COPD with chronic bronchitis. He is currently managed on Spiriva and Symbicort. He is on chlorpheniramine bid, astelin qhs.  He is having a lot of nasal drainage, lots of cough with light or clear mucous. His exertional tolerance is stable, he has exertional SOB. Has started wearing his O2 again - 2L/min when sitting still, not always when exerting. Might use a backpack - [employer]. He has albuterol but does not use it.   ROV 04/22/16 -- patient has a history of COPD, chronic bronchitis, obesity and obstructive sleep apnea. He also has rhinitis with associated cough. He has good compliance with his CPAP, benefits from it. Currently managed on Spiriva and Symbicort. He has been on doxy for LE cellulitis, has also helped his mucous some. His rhinitis has ups and downs. Managed on chlorpheniramine, astelin. He uses albuterol rarely. Has not had an AE since last visit.    Vitals:   04/22/16 1553  BP: 124/68  BP Location: Left Arm  Cuff Size: Normal  Pulse: 63  SpO2: 93%  Weight: 236 lb (107 kg)  Height: 5\' 7"  (1.702 m)   Gen: Pleasant, kyphotic, in no distress,  normal affect  ENT: No lesions,  mouth clear,  oropharynx clear, no postnasal drip, some nasal obstruction  Neck: No JVD, no TMG, no carotid bruits  Lungs:  No use of accessory muscles, clear without rales or rhonchi, very distant exp wheeze  Cardiovascular: RRR, heart sounds normal, no murmur or gallops, 1+ peripheral edema  Musculoskeletal: No deformities, no cyanosis or clubbing  Neuro: alert, non focal  Skin: Warm, no lesions or rashes      11/18/13 --  COMPARISON: No prior CT chest. Multiple prior chest x-rays, most  recently 11/17/2013.  FINDINGS:  Contrast opacification of the pulmonary arteries is excellent. No  filling defects within either main pulmonary artery or their  branches in either lung to suggest pulmonary embolism.  Heart  markedly enlarged with left ventricular predominance and evidence of  left ventricular hypertrophy. Small pericardial effusion. Moderate 3  vessel coronary atherosclerosis. Severe atherosclerosis involving  the thoracic and upper abdominal aorta and their visualized branches  without evidence of aneurysm. Tortuous thoracic and upper abdominal  aorta.  Emphysematous changes throughout both lungs. Subsegmental airspace  consolidation with air bronchograms in the deep posterior right  lower lobe. Lungs otherwise clear. No pulmonary parenchymal nodules  or masses. No pleural effusions.  Thyroid gland enlargement with multiple nodules, the largest nodules  adjacent to one another in the right lobe measuring approximately  2.5 x 2.2 x 3.1 cm in the mid pole and 3.0 x 3.0 x 2.1 cm in the  lower pole. Scattered normal size lymph nodes in the mediastinum,  hila, and axilla without significant lymphadenopathy. Mild bilateral  gynecomastia.  Irregularity involving the contour of the visualized liver with  relative enlargement of the left lobe. Cysts involving the mid  portions of the visualized kidneys. Moderate pancreatic atrophy.  Hiatal hernia containing predominately intra-abdominal fat.  Diverticulosis involving the visualized distal transverse and  proximal descending colon without evidence of acute diverticulitis.  Bone window images demonstrated diffuse thoracic spondylosis and  exaggeration of the usual thoracic kyphosis.  Review of the MIP images confirms the above findings.   IMPRESSION:  1. No evidence of pulmonary embolism.  2. COPD/emphysema. Atelectasis involving the right lower lobe. No  acute cardiopulmonary disease otherwise.  3. Multinodular goiter, with adjacent 3 cm nodules in the right  lobe.  4. Hepatic cirrhosis.  5. Moderate pancreatic atrophy.  6. Diverticulosis involving the visualized distal transverse and  proximal descending colon.  7. Cardiomegaly with  3 vessel coronary artery disease. Left  ventricular hypertrophy. Small pericardial effusion.  8. Mild bilateral gynecomastia   Assessment & Plan:   COPD (chronic obstructive pulmonary disease) Please continue your Spiriva and Symbicort as you are taking them  Take albuterol 2 puffs up to every 4 hours if needed for shortness of breath.  Continue your exercise routine.  Continue your CPAP every night Follow with Dr Lamonte Sakai in 6 months or sooner if you have any problems.  Allergic rhinitis Continue astelin nasal spray and chlorpheniramine as you are taking them   Obstructive sleep apnea CPAP every night/     Baltazar Apo, MD, PhD 04/22/2016, 4:12 PM [facility] [phone] or if no answer [phone]

## 2016-04-22 NOTE — Patient Instructions (Addendum)
Please continue your Spiriva and Symbicort as you are taking them  Take albuterol 2 puffs up to every 4 hours if needed for shortness of breath.  Continue astelin nasal spray and chlorpheniramine as you are taking them  Continue your exercise routine.  Continue your CPAP every night Follow with Dr Lamonte Sakai in 6 months or sooner if you have any problems.

## 2016-04-22 NOTE — Assessment & Plan Note (Signed)
CPAP every night. °

## 2016-04-22 NOTE — Assessment & Plan Note (Signed)
Continue astelin nasal spray and chlorpheniramine as you are taking them

## 2016-04-24 ENCOUNTER — Encounter: Payer: Self-pay | Admitting: Internal Medicine

## 2016-04-24 ENCOUNTER — Ambulatory Visit (INDEPENDENT_AMBULATORY_CARE_PROVIDER_SITE_OTHER): Payer: Medicare Other | Admitting: Internal Medicine

## 2016-04-24 VITALS — BP 120/66 | HR 76 | Temp 97.7°F | Resp 16 | Ht 67.0 in | Wt 239.2 lb

## 2016-04-24 DIAGNOSIS — I872 Venous insufficiency (chronic) (peripheral): Secondary | ICD-10-CM

## 2016-04-24 DIAGNOSIS — I10 Essential (primary) hypertension: Secondary | ICD-10-CM

## 2016-04-24 DIAGNOSIS — Z79899 Other long term (current) drug therapy: Secondary | ICD-10-CM

## 2016-04-24 DIAGNOSIS — R2681 Unsteadiness on feet: Secondary | ICD-10-CM

## 2016-04-24 DIAGNOSIS — L03115 Cellulitis of right lower limb: Secondary | ICD-10-CM | POA: Diagnosis not present

## 2016-04-24 MED ORDER — FUROSEMIDE 40 MG PO TABS: ORAL_TABLET | ORAL | 3 refills | Status: DC

## 2016-04-25 LAB — BASIC METABOLIC PANEL WITH GFR
BUN: 20 mg/dL (ref 7–25)
CO2: 29 mmol/L (ref 20–31)
Calcium: 9.2 mg/dL (ref 8.6–10.3)
Chloride: 98 mmol/L (ref 98–110)
Creat: 0.78 mg/dL (ref 0.70–1.11)
GFR, EST NON AFRICAN AMERICAN: 83 mL/min (ref >=60)
GLUCOSE: 80 mg/dL (ref 65–99)
POTASSIUM: 4.2 mmol/L (ref 3.5–5.3)
Sodium: 137 mmol/L (ref 135–146)

## 2016-04-25 LAB — MAGNESIUM: MAGNESIUM: 2 mg/dL (ref 1.5–2.5)

## 2016-04-26 NOTE — Progress Notes (Signed)
[facility]   Unk Pinto, [name]    Uvaldo Bristle. Silverio Lay, P.A.-C      Starlyn Skeans, P.A.-C  [facility]                [address] [place], [place] [id number] Telephone [phone] Telefax [phone] Subjective:    Patient ID: Richard Rosales, male    DOB: 11-Jul-1930, 82 y.o.   MRN: [id number]  HPI  Patient returns for f/u of stasis cellulitis compromising chronic venous insufficiency and worsening dependent edema. Since last OV patient was advised to increase his Furosemide to bid and he reports improvement with less LE edema and also feels that he is breathing better. He is noted to have a 6+ # weight loss over the last week.    Patient continues to have difficulty with gait and balance and had seen DR Tat , but desires to have a 2sd opinion re: his gait issues  Medication Sig  . acetaminophen-codeine (TYLENOL #3) 300-30 MG per tablet Take 1 tablet by mouth every 4 (four) hours as needed for moderate pain.   Marland Kitchen albuterol (PROVENTIL HFA;VENTOLIN HFA) 108 (90 Base) MCG/ACT inhaler Inhale 2 puffs into the lungs every 6 (six) hours as needed for wheezing or shortness of breath.  Marland Kitchen albuterol (PROVENTIL) (2.5 MG/3ML) 0.083% nebulizer solution Take 3 mLs (2.5 mg total) by nebulization every 4 (four) hours.  Marland Kitchen amoxicillin (AMOXIL) 500 MG capsule as directed. (DENTAL)  . aspirin 81 MG tablet Take 1 tablet 3 days per week  . azelastine (ASTELIN) 0.1 % nasal spray Place 1 spray into both nostrils 2 (two) times daily.  Marland Kitchen b complex vitamins tablet Take 1 tablet by mouth every morning.   . budesonide-formoterol (SYMBICORT) 160-4.5 MCG/ACT inhaler Inhale 2 puffs into the lungs 2 (two) times daily.  . chlorpheniramine (CHLOR-TRIMETON) 4 MG tablet Take 1 tablet by mouth in the morning and 1 tablet at bedtime  . Cholecalciferol (VITAMIN D) 2000 UNITS CAPS Take 2 capsules by mouth daily.   Marland Kitchen diltiazem (DILACOR XR)  240 MG 24 hr capsule Take 1 capsule (240 mg total) by mouth 2 (two) times daily.  Marland Kitchen docusate sodium (COLACE) 100 MG capsule Take 100 mg by mouth 2 (two) times daily.    . fish oil-omega-3 fatty acids 1000 MG capsule Take 1 g by mouth 2 (two) times daily.    Marland Kitchen glucosamine-chondroitin 500-400 MG tablet Take 1 tablet by mouth 1 day or 1 dose.  Marland Kitchen guaifenesin (HUMIBID E) 400 MG TABS Take 400 mg by mouth 2 (two) times daily.    Marland Kitchen HYDROcodone-acetaminophen (NORCO/VICODIN) 5-325 MG per tablet Take 1 tablet by mouth every 6 (six) hours as needed for moderate pain (Only takes during CT/MRI testing).   Marland Kitchen levothyroxine (SYNTHROID, LEVOTHROID) 100 MCG tablet Take 1 tablet (100 mcg total) by mouth daily before breakfast.  . losartan (COZAAR) 100 MG tablet TAKE 1 TABLET DAILY  . omeprazole (PRILOSEC) 40 MG capsule TAKE 1 CAPSULE TWICE A DAY  . SPIRIVA HANDIHALER 18 MCG inhalation capsule INHALE THE CONTENTS OF 1 CAPSULE DAILY  . furosemide (LASIX) 40 MG tablet Take 1 tablet (40 mg total) by mouth daily.   Allergies  Allergen Reactions  . Ace Inhibitors Cough  . Beta Adrenergic Blockers Other (See Comments)    bradycardia  . Decadron [Dexamethasone]   . Fluticasone-Salmeterol  Unable to remember per pt  . Hytrin [Terazosin]     Nasal congestion   . Prednisone     Shakes / tremors  . Vioxx [Rofecoxib] Other (See Comments)    dyspepsia   Review of Systems  10 point systems review negative except as above.    Objective:   Physical Exam  BP 120/66   Pulse 76   Temp 97.7 F (36.5 C)   Resp 16   Ht 5\' 7"  (1.702 m)   Wt 239 lb 3.2 oz (108.5 kg)   BMI 37.46 kg/m   Older, severely overweight WM in in distress   HEENT - Eac's patent. TM's Nl. EOM's full. PERRLA. NasoOroPharynx clear. Neck - supple. Nl Thyroid. Carotids 2+ & No bruits, nodes, JVD Chest - BS distant w/o Rales, rhonchi, wheezes. Cor - Nl HS. RRR w/o sig MGR. PP obscured by his edema. 1-2+ edema. MS- FROM w/o deformities.  Muscle power, tone and bulk is decreased. Gait broad based. Walking with a walker. Neuro - No obvious Cr N abnormalities. Sensory, motor and Cerebellar functions appear Nl w/o focal abnormalities. Skin - Previous cellulitis of Rt shin appears resolved.    Assessment & Plan:   1. Essential hypertension   2. Venous (peripheral) insufficiency  - continue Lasix bid for now - furosemide (LASIX) 40 MG tablet; Take 1 tablet 2 x/ day for fluid retention  Dispense: 180 tablet; Refill: 3  3. Cellulitis of leg, right - resolved   4. Unstable Gait    5. Medication management  - BASIC METABOLIC PANEL WITH GFR - Magnesium  - discussed meds/SE's. - has f/u in 2 weeks to closely follow renal functions

## 2016-05-14 ENCOUNTER — Ambulatory Visit: Payer: Medicare Other | Admitting: Internal Medicine

## 2016-05-14 ENCOUNTER — Ambulatory Visit (INDEPENDENT_AMBULATORY_CARE_PROVIDER_SITE_OTHER): Payer: Medicare Other | Admitting: Internal Medicine

## 2016-05-14 VITALS — BP 142/78 | HR 56 | Temp 97.3°F | Resp 16 | Ht 67.0 in | Wt 236.8 lb

## 2016-05-14 DIAGNOSIS — I872 Venous insufficiency (chronic) (peripheral): Secondary | ICD-10-CM | POA: Diagnosis not present

## 2016-05-14 DIAGNOSIS — L03115 Cellulitis of right lower limb: Secondary | ICD-10-CM

## 2016-05-14 DIAGNOSIS — Z79899 Other long term (current) drug therapy: Secondary | ICD-10-CM

## 2016-05-14 DIAGNOSIS — I10 Essential (primary) hypertension: Secondary | ICD-10-CM | POA: Diagnosis not present

## 2016-05-14 LAB — CBC WITH DIFFERENTIAL/PLATELET
BASOS ABS: 79 {cells}/uL (ref 0–200)
BASOS PCT: 1 %
EOS ABS: 553 {cells}/uL — AB (ref 15–500)
Eosinophils Relative: 7 %
HEMATOCRIT: 44.2 % (ref 38.5–50.0)
Hemoglobin: 14.2 g/dL (ref 13.2–17.1)
LYMPHS PCT: 19 %
Lymphs Abs: 1501 cells/uL (ref 850–3900)
MCH: 30.7 pg (ref 27.0–33.0)
MCHC: 32.1 g/dL (ref 32.0–36.0)
MCV: 95.7 fL (ref 80.0–100.0)
MONO ABS: 790 {cells}/uL (ref 200–950)
MPV: 10.4 fL (ref 7.5–12.5)
Monocytes Relative: 10 %
NEUTROS ABS: 4977 {cells}/uL (ref 1500–7800)
Neutrophils Relative %: 63 %
Platelets: 198 10*3/uL (ref 140–400)
RBC: 4.62 MIL/uL (ref 4.20–5.80)
RDW: 13.1 % (ref 11.0–15.0)
WBC: 7.9 10*3/uL (ref 3.8–10.8)

## 2016-05-14 LAB — BASIC METABOLIC PANEL WITH GFR
BUN: 13 mg/dL (ref 7–25)
CHLORIDE: 101 mmol/L (ref 98–110)
CO2: 30 mmol/L (ref 20–31)
Calcium: 9.5 mg/dL (ref 8.6–10.3)
Creat: 0.7 mg/dL (ref 0.70–1.11)
GFR, Est African American: >89 mL/min (ref >=60)
GFR, Est Non African American: 87 mL/min (ref >=60)
GLUCOSE: 94 mg/dL (ref 65–99)
POTASSIUM: 4.4 mmol/L (ref 3.5–5.3)
Sodium: 138 mmol/L (ref 135–146)

## 2016-05-14 MED ORDER — DOXYCYCLINE HYCLATE 100 MG PO CAPS: ORAL_CAPSULE | ORAL | 1 refills | Status: DC

## 2016-05-15 ENCOUNTER — Encounter: Payer: Self-pay | Admitting: Internal Medicine

## 2016-05-15 NOTE — Progress Notes (Signed)
ADULT & ADOLESCENT INTERNAL MEDICINE   Unk Pinto, [name]    Uvaldo Bristle. Silverio Lay, P.A.-C      Starlyn Skeans, P.A.-C  [facility]                [address] [place], [place] [id number] Telephone [phone] Telefax [phone]  Subjective:    Patient ID: Richard Rosales, male    DOB: 02-27-1931, 82 y.o.   MRN: [id number]  HPI  Patient returns for 2sd 2 week f/u of cellulitis of R shin and increased Lasix for his edema. After increasing his Lasix to bid he lost 3 # more over the last 2 weeks for a total loss of 9# over the last month and reports subjectively, he feels his edema is much improved. He has completed 30 days of Doxycycline.   Medication Sig  . acetaminophen-codeine (TYLENOL #3) 300-30 MG per tablet Take 1 tablet by mouth every 4 (four) hours as needed for moderate pain.   Marland Kitchen albuterol (PROVENTIL HFA;VENTOLIN HFA) 108 (90 Base) MCG/ACT inhaler Inhale 2 puffs into the lungs every 6 (six) hours as needed for wheezing or shortness of breath.  Marland Kitchen albuterol (PROVENTIL) (2.5 MG/3ML) 0.083% nebulizer solution Take 3 mLs (2.5 mg total) by nebulization every 4 (four) hours.  Marland Kitchen amoxicillin (AMOXIL) 500 MG capsule as directed. (DENTAL)  . aspirin 81 MG tablet Take 1 tablet 3 days per week  . azelastine (ASTELIN) 0.1 % nasal spray Place 1 spray into both nostrils 2 (two) times daily.  Marland Kitchen b complex vitamins tablet Take 1 tablet by mouth every morning.   . budesonide-formoterol (SYMBICORT) 160-4.5 MCG/ACT inhaler Inhale 2 puffs into the lungs 2 (two) times daily.  . chlorpheniramine (CHLOR-TRIMETON) 4 MG tablet Take 1 tablet by mouth in the morning and 1 tablet at bedtime  . Cholecalciferol (VITAMIN D) 2000 UNITS CAPS Take 2 capsules by mouth daily.   Marland Kitchen diltiazem (DILACOR XR) 240 MG 24 hr capsule Take 1 capsule (240 mg total) by mouth 2 (two) times daily.  Marland Kitchen docusate sodium (COLACE) 100 MG capsule Take 100 mg by mouth 2 (two) times  daily.    . fish oil-omega-3 fatty acids 1000 MG capsule Take 1 g by mouth 2 (two) times daily.    . furosemide (LASIX) 40 MG tablet Take 1 tablet 2 x/ day for fluid retention  . glucosamine-chondroitin 500-400 MG tablet Take 1 tablet by mouth 1 day or 1 dose.  Marland Kitchen guaifenesin (HUMIBID E) 400 MG TABS Take 400 mg by mouth 2 (two) times daily.    Marland Kitchen HYDROcodone-acetaminophen (NORCO/VICODIN) 5-325 MG per tablet Take 1 tablet by mouth every 6 (six) hours as needed for moderate pain (Only takes during CT/MRI testing).   Marland Kitchen levothyroxine (SYNTHROID, LEVOTHROID) 100 MCG tablet Take 1 tablet (100 mcg total) by mouth daily before breakfast.  . losartan (COZAAR) 100 MG tablet Take 100 mg by mouth daily.  Marland Kitchen omeprazole (PRILOSEC) 40 MG capsule Take 40 mg by mouth 2 (two) times daily.  Marland Kitchen SPIRIVA HANDIHALER 18 MCG inhalation capsule INHALE THE CONTENTS OF 1 CAPSULE DAILY   Allergies  Allergen Reactions  . Ace Inhibitors Cough  . Beta Adrenergic Blockers Other (See Comments)    bradycardia  . Decadron [Dexamethasone]     unknown  . Fluticasone-Salmeterol     Doesn't remember  . Hytrin [Terazosin]     Nasal congestion   .  Prednisone     Shakes / tremors  . Vioxx [Rofecoxib] Other (See Comments)    dyspepsia   Past Medical History:  Diagnosis Date  . Acute bronchitis   . Adenomatous colon polyp   . Allergic rhinitis   . Anemia    iron deficient  . Atrial fibrillation ([place])   . Atrial flutter ([place])   . Chronic gastritis   . COPD (chronic obstructive pulmonary disease) ([place])   . Diastolic dysfunction   . Diverticulosis of colon    recurrent GI bleeding  . DJD (degenerative joint disease)   . GERD (gastroesophageal reflux disease)   . GI bleed   . HTN (hypertension)   . Hyperplastic colon polyp 2003  . Hypothyroidism   . Moderate aortic insufficiency   . OSA (obstructive sleep apnea)    compliants w/ CPAP  . Positive PPD    remote  . Shortness of breath   . Venous insufficiency     Past Surgical History:  Procedure Laterality Date  . CARDIAC CATHETERIZATION  7.27.05   revealed a preserved EF with no CAD  . COLONOSCOPY    . HERNIA REPAIR Bilateral   . JOINT REPLACEMENT  2001   lt total knee-rt one done 00  . NASAL SINUS SURGERY    . POLYPECTOMY     adenomatous polyps 2010  . SHOULDER ARTHROSCOPY    . TOE SURGERY Right   . TONSILLECTOMY    . TOTAL KNEE ARTHROPLASTY     bilateral   Review of Systems  10 point systems review negative except as above.    Objective:   Physical Exam  BP (!) 142/78   Pulse (!) 56   Temp 97.3 F (36.3 C)   Resp 16   Ht 5\' 7"  (1.702 m)   Wt 236 lb 12.8 oz (107.4 kg)   BMI 37.09 kg/m   Sedentary ,severely Obese elderly male.   HEENT - Eac's patent. TM's Nl. EOM's full. PERRLA. NasoOroPharynx clear. Neck - supple. Nl Thyroid. Carotids 2+ & No bruits, nodes, JVD Chest - kyphotic w/distant BS w/o Rales, rhonchi, wheezes. Cor - Nl HS. RRR w/o sig MGR. PP obscured by  1-2+edema. Abd - No palpable organomegaly, masses or tenderness. BS nl. MS- FROM w/o deformities. Muscle power, tone and bulk decreased. Gait broad based w/a walker. Neuro - No obvious Cr N abnormalities. Sensory, motor and Cerebellar functions appear Nl w/o focal abnormalities. Skin - No sign of cellulitis of shins.    Assessment & Plan:   1. Essential hypertension  - BASIC METABOLIC PANEL WITH GFR  2. Venous (peripheral) insufficiency  - BASIC METABOLIC PANEL WITH GFR  3. Cellulitis of leg, right  - CBC with Differential/Platelet - doxycycline (VIBRAMYCIN) 100 MG capsule; Take 1 capsule daily with food for skin infection  Dispense: 30 capsule; Refill: 1  4. Venous stasis dermatitis of right lower extremity   5. Medication management  - CBC with Differential/Platelet - BASIC METABOLIC PANEL WITH GFR

## 2016-05-19 ENCOUNTER — Ambulatory Visit (INDEPENDENT_AMBULATORY_CARE_PROVIDER_SITE_OTHER): Payer: Medicare Other | Admitting: Internal Medicine

## 2016-05-19 ENCOUNTER — Encounter: Payer: Self-pay | Admitting: Internal Medicine

## 2016-05-19 VITALS — BP 142/78 | HR 72 | Ht 69.0 in | Wt 234.2 lb

## 2016-05-19 DIAGNOSIS — I10 Essential (primary) hypertension: Secondary | ICD-10-CM | POA: Diagnosis not present

## 2016-05-19 DIAGNOSIS — G4733 Obstructive sleep apnea (adult) (pediatric): Secondary | ICD-10-CM | POA: Diagnosis not present

## 2016-05-19 DIAGNOSIS — I482 Chronic atrial fibrillation, unspecified: Secondary | ICD-10-CM

## 2016-05-19 NOTE — Progress Notes (Signed)
PCP: Alesia Richards, MD  The patient presents today for routine cardiology followup.  Since last being seen in our clinic, the patient reports doing reasonably well. His SOB is stable.  He has had some issues with venous insufficiency/ cellulitis.  He is wearing support hose today.  Neuropathy is stable.   Today, he denies symptoms of palpitations, chest pain,  Dizziness, or neurologic sequela.   The patient feels that he is tolerating medications without difficulties and is otherwise without complaint today.    Past Medical History:  Diagnosis Date  . Acute bronchitis   . Adenomatous colon polyp   . Allergic rhinitis   . Anemia    iron deficient  . Atrial fibrillation ([place])   . Atrial flutter ([place])   . Chronic gastritis   . COPD (chronic obstructive pulmonary disease) ([place])   . Diastolic dysfunction   . Diverticulosis of colon    recurrent GI bleeding  . DJD (degenerative joint disease)   . GERD (gastroesophageal reflux disease)   . GI bleed   . HTN (hypertension)   . Hyperplastic colon polyp 2003  . Hypothyroidism   . Moderate aortic insufficiency   . OSA (obstructive sleep apnea)    compliants w/ CPAP  . Positive PPD    remote  . Shortness of breath   . Venous insufficiency    Past Surgical History:  Procedure Laterality Date  . CARDIAC CATHETERIZATION  7.27.05   revealed a preserved EF with no CAD  . COLONOSCOPY    . HERNIA REPAIR Bilateral   . JOINT REPLACEMENT  2001   lt total knee-rt one done 00  . NASAL SINUS SURGERY    . POLYPECTOMY     adenomatous polyps 2010  . SHOULDER ARTHROSCOPY    . TOE SURGERY Right   . TONSILLECTOMY    . TOTAL KNEE ARTHROPLASTY     bilateral    Current Outpatient Prescriptions  Medication Sig Dispense Refill  . acetaminophen-codeine (TYLENOL #3) 300-30 MG per tablet Take 1 tablet by mouth every 4 (four) hours as needed for moderate pain.     Marland Kitchen albuterol (PROVENTIL HFA;VENTOLIN HFA) 108 (90 Base) MCG/ACT inhaler Inhale 2  puffs into the lungs every 6 (six) hours as needed for wheezing or shortness of breath. 1 Inhaler 2  . albuterol (PROVENTIL) (2.5 MG/3ML) 0.083% nebulizer solution Take 3 mLs (2.5 mg total) by nebulization every 4 (four) hours. 300 mL 11  . amoxicillin (AMOXIL) 500 MG capsule as directed. (DENTAL)    . aspirin 81 MG tablet Take 1 tablet 3 days per week    . azelastine (ASTELIN) 0.1 % nasal spray Place 1 spray into both nostrils 2 (two) times daily. 90 mL 1  . b complex vitamins tablet Take 1 tablet by mouth every morning.     . budesonide-formoterol (SYMBICORT) 160-4.5 MCG/ACT inhaler Inhale 2 puffs into the lungs 2 (two) times daily. 3 Inhaler 3  . chlorpheniramine (CHLOR-TRIMETON) 4 MG tablet Take 1 tablet by mouth in the morning and 1 tablet at bedtime    . Cholecalciferol (VITAMIN D) 2000 UNITS CAPS Take 2 capsules by mouth daily.     Marland Kitchen diltiazem (DILACOR XR) 240 MG 24 hr capsule Take 1 capsule (240 mg total) by mouth 2 (two) times daily. 24 capsule 0  . docusate sodium (COLACE) 100 MG capsule Take 100 mg by mouth 2 (two) times daily.      Marland Kitchen doxycycline (VIBRAMYCIN) 100 MG capsule Take 1 capsule daily with  food for skin infection 30 capsule 1  . fish oil-omega-3 fatty acids 1000 MG capsule Take 1 g by mouth 2 (two) times daily.      . furosemide (LASIX) 40 MG tablet Take 40 mg by mouth daily.    Marland Kitchen glucosamine-chondroitin 500-400 MG tablet Take 1 tablet by mouth 1 day or 1 dose.    Marland Kitchen guaifenesin (HUMIBID E) 400 MG TABS Take 400 mg by mouth 2 (two) times daily.      Marland Kitchen HYDROcodone-acetaminophen (NORCO/VICODIN) 5-325 MG per tablet Take 1 tablet by mouth every 6 (six) hours as needed for moderate pain (Only takes during CT/MRI testing).     Marland Kitchen levothyroxine (SYNTHROID, LEVOTHROID) 100 MCG tablet Take 1 tablet (100 mcg total) by mouth daily before breakfast. 90 tablet 4  . losartan (COZAAR) 100 MG tablet Take 100 mg by mouth daily.    Marland Kitchen omeprazole (PRILOSEC) 40 MG capsule Take 40 mg by mouth 2 (two)  times daily.    Marland Kitchen SPIRIVA HANDIHALER 18 MCG inhalation capsule INHALE THE CONTENTS OF 1 CAPSULE DAILY 90 capsule 1   No current facility-administered medications for this visit.     Allergies  Allergen Reactions  . Ace Inhibitors Cough  . Beta Adrenergic Blockers Other (See Comments)    bradycardia  . Decadron [Dexamethasone]     unknown  . Fluticasone-Salmeterol     Doesn't remember  . Hytrin [Terazosin]     Nasal congestion   . Prednisone     Shakes / tremors  . Vioxx [Rofecoxib] Other (See Comments)    dyspepsia    Social History   Social History  . Marital status: Married    Spouse name: N/A  . Number of children: 4  . Years of education: N/A   Occupational History  . part-time as a Theme park manager at [employer]  . Smoking status: Former Smoker    Packs/day: 1.00    Years: 20.00    Types: Cigarettes    Quit date: 08/01/1978  . Smokeless tobacco: Never Used  . Alcohol use 0.0 oz/week     Comment: small glass of wine daily  . Drug use: No  . Sexual activity: Not on file   Other Topics Concern  . Not on file   Social History Narrative   Patient lives in [place]   Retired [employer] but works part-time as a Theme park manager at [employer] in [place].    Family History  Problem Relation Age of Onset  . Hypertension Mother   . Diabetes Father   . Lung cancer Brother     chemical     Physical Exam: Vitals:   05/19/16 1607  BP: (!) 142/78  Pulse: 72  Weight: 234 lb 3.2 oz (106.2 kg)  Height: 5\' 9"  (1.753 m)    GEN- The patient is overweight, elderly, and chronically ill appearing, alert and oriented x 3 today.   Head- normocephalic, atraumatic Eyes-  Sclera clear, conjunctiva pink Ears- hearing intact Oropharynx- clear Neck- supple, JVP 8cm Lungs- coarse BS with prolonged expiratory phase today Heart- irregular rate and rhythm  GI- soft, NT, ND, + BS Extremities- no clubbing, cyanosis, 1+  edema, support hose are in place MS- walks slowly with a cane Neuro- strength and sensation are intact  ekg today reveals afib , V rate 72 bpm, RBBB, inferior infarct pattern  Assessment and Plan:  1. Hypertensive cardiovascular disease stable No changes  2. Permanent afib  Declines anticoagulation again today.  Given falls, may be a poor candidate for anticoagulation anyway  3. Venous insufficiency Support hose encouraged  4. Aortic insufficiency Echo reviewed from 2015.  We discussed feasibility of repeating an echo today.  As he is not a surgical candidate, he is clear that he does not wish to have an echo to further evaluate AI at this time.  5. OSA Reports compliance with CPAP   Return to see me in 1 year unless problems arise in the interim.  Thompson Grayer MD, [facility] 05/19/2016 4:18 PM

## 2016-05-19 NOTE — Patient Instructions (Signed)

## 2016-05-25 ENCOUNTER — Other Ambulatory Visit: Payer: Self-pay | Admitting: Internal Medicine

## 2016-06-02 ENCOUNTER — Ambulatory Visit (INDEPENDENT_AMBULATORY_CARE_PROVIDER_SITE_OTHER): Payer: Medicare Other | Admitting: Physician Assistant

## 2016-06-02 ENCOUNTER — Encounter: Payer: Self-pay | Admitting: Physician Assistant

## 2016-06-02 VITALS — BP 132/70 | HR 78 | Temp 97.3°F | Resp 14 | Ht 67.0 in | Wt 241.0 lb

## 2016-06-02 DIAGNOSIS — R6889 Other general symptoms and signs: Secondary | ICD-10-CM

## 2016-06-02 DIAGNOSIS — R938 Abnormal findings on diagnostic imaging of other specified body structures: Secondary | ICD-10-CM

## 2016-06-02 DIAGNOSIS — J301 Allergic rhinitis due to pollen: Secondary | ICD-10-CM

## 2016-06-02 DIAGNOSIS — G894 Chronic pain syndrome: Secondary | ICD-10-CM

## 2016-06-02 DIAGNOSIS — I10 Essential (primary) hypertension: Secondary | ICD-10-CM | POA: Diagnosis not present

## 2016-06-02 DIAGNOSIS — E039 Hypothyroidism, unspecified: Secondary | ICD-10-CM

## 2016-06-02 DIAGNOSIS — G4733 Obstructive sleep apnea (adult) (pediatric): Secondary | ICD-10-CM

## 2016-06-02 DIAGNOSIS — E782 Mixed hyperlipidemia: Secondary | ICD-10-CM | POA: Diagnosis not present

## 2016-06-02 DIAGNOSIS — Z86010 Personal history of colonic polyps: Secondary | ICD-10-CM | POA: Diagnosis not present

## 2016-06-02 DIAGNOSIS — N401 Enlarged prostate with lower urinary tract symptoms: Secondary | ICD-10-CM

## 2016-06-02 DIAGNOSIS — I4891 Unspecified atrial fibrillation: Secondary | ICD-10-CM

## 2016-06-02 DIAGNOSIS — Z79899 Other long term (current) drug therapy: Secondary | ICD-10-CM | POA: Diagnosis not present

## 2016-06-02 DIAGNOSIS — I119 Hypertensive heart disease without heart failure: Secondary | ICD-10-CM

## 2016-06-02 DIAGNOSIS — E559 Vitamin D deficiency, unspecified: Secondary | ICD-10-CM

## 2016-06-02 DIAGNOSIS — I5032 Chronic diastolic (congestive) heart failure: Secondary | ICD-10-CM

## 2016-06-02 DIAGNOSIS — J449 Chronic obstructive pulmonary disease, unspecified: Secondary | ICD-10-CM | POA: Diagnosis not present

## 2016-06-02 DIAGNOSIS — R9389 Abnormal findings on diagnostic imaging of other specified body structures: Secondary | ICD-10-CM

## 2016-06-02 DIAGNOSIS — R7303 Prediabetes: Secondary | ICD-10-CM

## 2016-06-02 DIAGNOSIS — K5731 Diverticulosis of large intestine without perforation or abscess with bleeding: Secondary | ICD-10-CM | POA: Diagnosis not present

## 2016-06-02 DIAGNOSIS — I700 Atherosclerosis of aorta: Secondary | ICD-10-CM | POA: Diagnosis not present

## 2016-06-02 DIAGNOSIS — K219 Gastro-esophageal reflux disease without esophagitis: Secondary | ICD-10-CM

## 2016-06-02 DIAGNOSIS — Z0001 Encounter for general adult medical examination with abnormal findings: Secondary | ICD-10-CM | POA: Diagnosis not present

## 2016-06-02 DIAGNOSIS — Z0000 Encounter for general adult medical examination without abnormal findings: Secondary | ICD-10-CM

## 2016-06-02 DIAGNOSIS — N138 Other obstructive and reflux uropathy: Secondary | ICD-10-CM

## 2016-06-02 LAB — CBC WITH DIFFERENTIAL/PLATELET
BASOS PCT: 1 %
Basophils Absolute: 80 cells/uL (ref 0–200)
EOS PCT: 9 %
Eosinophils Absolute: 720 cells/uL — ABNORMAL HIGH (ref 15–500)
HEMATOCRIT: 42.4 % (ref 38.5–50.0)
HEMOGLOBIN: 13.9 g/dL (ref 13.2–17.1)
LYMPHS ABS: 1680 {cells}/uL (ref 850–3900)
Lymphocytes Relative: 21 %
MCH: 30.7 pg (ref 27.0–33.0)
MCHC: 32.8 g/dL (ref 32.0–36.0)
MCV: 93.6 fL (ref 80.0–100.0)
MONO ABS: 800 {cells}/uL (ref 200–950)
MPV: 10.4 fL (ref 7.5–12.5)
Monocytes Relative: 10 %
NEUTROS ABS: 4720 {cells}/uL (ref 1500–7800)
Neutrophils Relative %: 59 %
Platelets: 203 10*3/uL (ref 140–400)
RBC: 4.53 MIL/uL (ref 4.20–5.80)
RDW: 13 % (ref 11.0–15.0)
WBC: 8 10*3/uL (ref 3.8–10.8)

## 2016-06-02 LAB — HEMOGLOBIN A1C
Hgb A1c MFr Bld: 5.3 % (ref <5.7)
Mean Plasma Glucose: 105 mg/dL

## 2016-06-02 LAB — TSH: TSH: 3.41 mIU/L (ref 0.40–4.50)

## 2016-06-02 MED ORDER — ACETAMINOPHEN-CODEINE #3 300-30 MG PO TABS: 1.0000 | ORAL_TABLET | ORAL | 3 refills | Status: DC | PRN

## 2016-06-02 NOTE — Patient Instructions (Signed)
Get chest x ray  Common causes of cough OR hoarseness OR sore throat:   Allergies, Viral Infections, Acid Reflux and Bacterial Infections.  1) Allergies and viral infections cause a cough OR sore throat by post nasal drip and are often worse at night, can also have sneezing, lower grade fevers, clear/yellow mucus. This is best treated with allergy medications or nasal sprays.  Please get on allegra for 1-2 weeks The strongest is allegra or fexafinadine  Cheapest at [employer], [employer], [employer]  2) Bacterial infections are more severe than allergies or viral infections with fever, teeth pain, fatigue. This can be treated with prednisone and the same over the counter medication and after 7 days can be treated with an antibiotic.   3) Silent reflux/GERD can cause a cough OR sore throat OR hoarseness WITHOUT heart burn because the esophagus that goes to the stomach and trachea that goes to the lungs are very close and when you lay down the acid can irritate your throat and lungs. This can cause hoarseness, cough, and wheezing. Please stop any alcohol or anti-inflammatories like aleve/advil/ibuprofen and start an over the counter Prilosec or omeprazole 1-2 times daily 2mins before food for 2 weeks, then switch to over the counter zantac/ratinidine or pepcid/famotadine once at night for 2 weeks.   4) sometimes irritation causes more irritation. Try voice rest, use sugar free cough drops to prevent coughing, and try to stop clearing your throat.   If you ever have a cough that does not go away after trying these things please make a follow up visit for further evaluation or we can refer you to a specialist. Or if you ever have shortness of breath or chest pain go to the ER.    Before you even begin to attack a weight-loss plan, it pays to remember this: You are not fat. You have fat. Losing weight isn't about blame or shame; it's simply another achievement to accomplish. Dieting is like any other skill-you  have to buckle down and work at it. As long as you act in a smart, reasonable way, you'll ultimately get where you want to be. Here are some weight loss pearls for you.  1. It's Not a Diet. It's a Lifestyle Thinking of a diet as something you're on and suffering through only for the short term doesn't work. To shed weight and keep it off, you need to make permanent changes to the way you eat. It's OK to indulge occasionally, of course, but if you cut calories temporarily and then revert to your old way of eating, you'll gain back the weight quicker than you can say yo-yo. Use it to lose it. Research shows that one of the best predictors of long-term weight loss is how many pounds you drop in the first month. For that reason, nutritionists often suggest being stricter for the first two weeks of your new eating strategy to build momentum. Cut out added sugar and alcohol and avoid unrefined carbs. After that, figure out how you can reincorporate them in a way that's healthy and maintainable.  2. There's a Right Way to Exercise Working out burns calories and fat and boosts your metabolism by building muscle. But those trying to lose weight are notorious for overestimating the number of calories they burn and underestimating the amount they take in. Unfortunately, your system is biologically programmed to hold on to extra pounds and that means when you start exercising, your body senses the deficit and ramps up its hunger signals.  If you're not diligent, you'll eat everything you burn and then some. Use it to lose it. Cardio gets all the exercise glory, but strength and interval training are the real heroes. They help you build lean muscle, which in turn increases your metabolism and calorie-burning ability 3. Don't Overreact to Mild Hunger Some people have a hard time losing weight because of hunger anxiety. To them, being hungry is bad-something to be avoided at all costs-so they carry snacks with them and eat  when they don't need to. Others eat because they're stressed out or bored. While you never want to get to the point of being ravenous (that's when bingeing is likely to happen), a hunger pang, a craving, or the fact that it's 3:00 p.m. should not send you racing for the vending machine or obsessing about the energy bar in your purse. Ideally, you should put off eating until your stomach is growling and it's difficult to concentrate.  Use it to lose it. When you feel the urge to eat, use the HALT method. Ask yourself, Am I really hungry? Or am I angry or anxious, lonely or bored, or tired? If you're still not certain, try the apple test. If you're truly hungry, an apple should seem delicious; if it doesn't, something else is going on. Or you can try drinking water and making yourself busy, if you are still hungry try a healthy snack.  4. Not All Calories Are Created Equal The mechanics of weight loss are pretty simple: Take in fewer calories than you use for energy. But the kind of food you eat makes all the difference. Processed food that's high in saturated fat and refined starch or sugar can cause inflammation that disrupts the hormone signals that tell your brain you're full. The result: You eat a lot more.  Use it to lose it. Clean up your diet. Swap in whole, unprocessed foods, including vegetables, lean protein, and healthy fats that will fill you up and give you the biggest nutritional bang for your calorie buck. In a few weeks, as your brain starts receiving regular hunger and fullness signals once again, you'll notice that you feel less hungry overall and naturally start cutting back on the amount you eat.  5. Protein, Produce, and Plant-Based Fats Are Your Weight-Loss Trinity Here's why eating the three Ps regularly will help you drop pounds. Protein fills you up. You need it to build lean muscle, which keeps your metabolism humming so that you can torch more fat. People in a weight-loss program who  ate double the recommended daily allowance for protein (about 110 grams for a 150-pound woman) lost 70 percent of their weight from fat, while people who ate the RDA lost only about 40 percent, one study found. Produce is packed with filling fiber. "It's very difficult to consume too many calories if you're eating a lot of vegetables. Example: Three cups of broccoli is a lot of food, yet only 93 calories. (Fruit is another story. It can be easy to overeat and can contain a lot of calories from sugar, so be sure to monitor your intake.) Plant-based fats like olive oil and those in avocados and nuts are healthy and extra satiating.  Use it to lose it. Aim to incorporate each of the three Ps into every meal and snack. People who eat protein throughout the day are able to keep weight off, according to a study in the [place] of Clinical Nutrition. In addition to meat, poultry and seafood,  good sources are beans, lentils, eggs, tofu, and yogurt. As for fat, keep portion sizes in check by measuring out salad dressing, oil, and nut butters (shoot for one to two tablespoons). Finally, eat veggies or a little fruit at every meal. People who did that consumed 308 fewer calories but didn't feel any hungrier than when they didn't eat more produce.  7. How You Eat Is As Important As What You Eat In order for your brain to register that you're full, you need to focus on what you're eating. Sit down whenever you eat, preferably at a table. Turn off the TV or computer, put down your phone, and look at your food. Smell it. Chew slowly, and don't put another bite on your fork until you swallow. When women ate lunch this attentively, they consumed 30 percent less when snacking later than those who listened to an audiobook at lunchtime, according to a study in the [place] of Nutrition. 8. Weighing Yourself Really Works The scale provides the best evidence about whether your efforts are paying off. Seeing the  numbers tick up or down or stagnate is motivation to keep going-or to rethink your approach. A 2015 study at [facility] found that daily weigh-ins helped people lose more weight, keep it off, and maintain that loss, even after two years. Use it to lose it. Step on the scale at the same time every day for the best results. If your weight shoots up several pounds from one weigh-in to the next, don't freak out. Eating a lot of salt the night before or having your period is the likely culprit. The number should return to normal in a day or two. It's a steady climb that you need to do something about. 9. Too Much Stress and Too Little Sleep Are Your Enemies When you're tired and frazzled, your body cranks up the production of cortisol, the stress hormone that can cause carb cravings. Not getting enough sleep also boosts your levels of ghrelin, a hormone associated with hunger, while suppressing leptin, a hormone that signals fullness and satiety. People on a diet who slept only five and a half hours a night for two weeks lost 55 percent less fat and were hungrier than those who slept eight and a half hours, according to a study in the [place]. Use it to lose it. Prioritize sleep, aiming for seven hours or more a night, which research shows helps lower stress. And make sure you're getting quality zzz's. If a snoring spouse or a fidgety cat wakes you up frequently throughout the night, you may end up getting the equivalent of just four hours of sleep, according to a study from [facility]. Keep pets out of the bedroom, and use a white-noise app to drown out snoring. 10. You Will Hit a plateau-And You Can Bust Through It As you slim down, your body releases much less leptin, the fullness hormone.  If you're not strength training, start right now. Building muscle can raise your metabolism to help you overcome a plateau. To keep your body challenged and burning calories,  incorporate new moves and more intense intervals into your workouts or add another sweat session to your weekly routine. Alternatively, cut an extra 100 calories or so a day from your diet. Now that you've lost weight, your body simply doesn't need as much fuel.

## 2016-06-02 NOTE — Progress Notes (Signed)
MEDICARE ANNUAL WELLNESS VISIT AND FOLLOW UP Assessment:    Essential hypertension -     CBC with Differential/Platelet -     BASIC METABOLIC PANEL WITH GFR -     Hepatic function panel .planthn  Atrial fibrillation, unspecified type ([place]) Control blood pressure, cholesterol, glucose, increase exercise.   Chronic diastolic heart failure (HCC) Control blood pressure, cholesterol, glucose, increase exercise.  Weight down/stable  Hypertensive heart disease without heart failure - continue medications, DASH diet, exercise and monitor at home. Call if greater than 130/80.  -     CBC with Differential/Platelet -     BASIC METABOLIC PANEL WITH GFR -     Hepatic function panel  Chronic obstructive pulmonary disease, unspecified COPD type ([place]) Continue follow up pulm, continue inhalers -     DG Chest 2 View; Future  OSA and COPD overlap syndrome ([place]) Sleep apnea- continue CPAP, weight loss advised.   Diverticulosis of colon with hemorrhage Increase fiber, no symptoms at this time.   Hypothyroidism, unspecified type Hypothyroidism-check TSH level, continue medications the same, reminded to take on an empty stomach 30-34mins before food.  -     TSH  Gastroesophageal reflux disease, esophagitis presence not specified Continue PPI/H2 blocker, diet discussed  Chronic allergic rhinitis due to pollen, unspecified seasonality - Allegra OTC, increase H20, allergy hygiene explained.  History of colonic polyps Up to date  Morbid Obesity with co morbidities - long discussion about weight loss, diet, and exercise  Mixed hyperlipidemia -continue medications, check lipids, decrease fatty foods, increase activity.  -     Lipid panel  Prediabetes -     Hemoglobin A1c  Vitamin D deficiency  Medication management -     Magnesium  BPH with obstruction/lower urinary tract symptoms  Aortic atherosclerosis (HCC) Control blood pressure, cholesterol, glucose, increase exercise.  -      Lipid panel -     Hemoglobin A1c  Encounter for Medicare annual wellness exam  Chronic pain syndrome -     acetaminophen-codeine (TYLENOL #3) 300-30 MG tablet; Take 1 tablet by mouth every 4 (four) hours as needed for moderate pain.  Abnormal chest x-ray Will get CXR -     DG Chest 2 View; Future    Over 30 minutes of exam, counseling, chart review, and critical decision making was performed  Future Appointments Date Time Provider [place]  07/10/2016 2:45 PM Gardiner Barefoot, DPM [facility] None  07/16/2016 1:30 PM Britt Bottom, MD [facility] None  10/16/2016 3:45 PM Unk Pinto, MD [facility] None     Plan:   During the course of the visit the patient was educated and counseled about appropriate screening and preventive services including:    Pneumococcal vaccine   Influenza vaccine  Prevnar 13  Td vaccine  Screening electrocardiogram  Colorectal cancer screening  Diabetes screening  Glaucoma screening  Nutrition counseling    Subjective:  Richard Rosales is a 82 y.o. male who presents for Medicare Annual Wellness Visit and 3 month follow up for HTN, hyperlipidemia, prediabetes, and vitamin D Def.   His blood pressure has been controlled at home, today their BP is BP: 132/70  He does workout. He denies chest pain, shortness of breath, dizziness. He walks with a walker, will do stationary bike 20 mins daily.  He has a history of Afib x 2008, not on anticoagulant therapy due to recurrent diverticular bleeding while on therapy.  He has history of COPD/OSA and had recent exacerbation with abnormal CXR  in Oct, we will repeat that today, follwed by Dr. Malvin Johns.  He is on tylenol #3, he is on it 1-2 x a week for chronic pain, needs refill.   He is not on cholesterol medication and denies myalgias. His cholesterol is at goal. The cholesterol last visit was:   Lab Results  Component Value Date   CHOL 132 03/03/2016   HDL 51 03/03/2016   LDLCALC 59 03/03/2016    TRIG 112 03/03/2016   CHOLHDL 2.6 03/03/2016    He has been working on diet and exercise for prediabetes, and denies paresthesia of the feet, polydipsia and polyuria. Last A1C in the office was:  Lab Results  Component Value Date   HGBA1C 5.4 03/03/2016   Patient is on Vitamin D supplement, now on 2000 twice a day.  Lab Results  Component Value Date   VD25OH 50 03/03/2016     BMI is Body mass index is 37.75 kg/m., he is working on diet and exercise. Wt Readings from Last 3 Encounters:  06/02/16 241 lb (109.3 kg)  05/19/16 234 lb 3.2 oz (106.2 kg)  05/14/16 236 lb 12.8 oz (107.4 kg)   He is on thyroid medication. His medication was not changed last visit.   Lab Results  Component Value Date   TSH 3.61 03/03/2016  .   Medication Review: Current Outpatient Prescriptions on File Prior to Visit  Medication Sig Dispense Refill  . albuterol (PROVENTIL HFA;VENTOLIN HFA) 108 (90 Base) MCG/ACT inhaler Inhale 2 puffs into the lungs every 6 (six) hours as needed for wheezing or shortness of breath. 1 Inhaler 2  . albuterol (PROVENTIL) (2.5 MG/3ML) 0.083% nebulizer solution Take 3 mLs (2.5 mg total) by nebulization every 4 (four) hours. 300 mL 11  . amoxicillin (AMOXIL) 500 MG capsule as directed. (DENTAL)    . aspirin 81 MG tablet Take 1 tablet 3 days per week    . azelastine (ASTELIN) 0.1 % nasal spray Place 1 spray into both nostrils 2 (two) times daily. 90 mL 1  . b complex vitamins tablet Take 1 tablet by mouth every morning.     . budesonide-formoterol (SYMBICORT) 160-4.5 MCG/ACT inhaler Inhale 2 puffs into the lungs 2 (two) times daily. 3 Inhaler 3  . chlorpheniramine (CHLOR-TRIMETON) 4 MG tablet Take 1 tablet by mouth in the morning and 1 tablet at bedtime    . Cholecalciferol (VITAMIN D) 2000 UNITS CAPS Take 2 capsules by mouth daily.     Marland Kitchen DILT-XR 240 MG 24 hr capsule TAKE 1 CAPSULE TWICE A DAY 180 capsule 3  . docusate sodium (COLACE) 100 MG capsule Take 100 mg by mouth 2  (two) times daily.      Marland Kitchen doxycycline (VIBRAMYCIN) 100 MG capsule Take 1 capsule daily with food for skin infection 30 capsule 1  . fish oil-omega-3 fatty acids 1000 MG capsule Take 1 g by mouth 2 (two) times daily.      . furosemide (LASIX) 40 MG tablet Take 40 mg by mouth daily.    Marland Kitchen glucosamine-chondroitin 500-400 MG tablet Take 1 tablet by mouth 1 day or 1 dose.    Marland Kitchen guaifenesin (HUMIBID E) 400 MG TABS Take 400 mg by mouth 2 (two) times daily.      Marland Kitchen HYDROcodone-acetaminophen (NORCO/VICODIN) 5-325 MG per tablet Take 1 tablet by mouth every 6 (six) hours as needed for moderate pain (Only takes during CT/MRI testing).     Marland Kitchen levothyroxine (SYNTHROID, LEVOTHROID) 100 MCG tablet Take 1 tablet (100 mcg  total) by mouth daily before breakfast. 90 tablet 4  . losartan (COZAAR) 100 MG tablet Take 100 mg by mouth daily.    Marland Kitchen omeprazole (PRILOSEC) 40 MG capsule Take 40 mg by mouth 2 (two) times daily.    Marland Kitchen SPIRIVA HANDIHALER 18 MCG inhalation capsule INHALE THE CONTENTS OF 1 CAPSULE DAILY 90 capsule 1   No current facility-administered medications on file prior to visit.     Allergies: Allergies  Allergen Reactions  . Ace Inhibitors Cough  . Beta Adrenergic Blockers Other (See Comments)    bradycardia  . Decadron [Dexamethasone]     unknown  . Fluticasone-Salmeterol     Doesn't remember  . Hytrin [Terazosin]     Nasal congestion   . Prednisone     Shakes / tremors  . Vioxx [Rofecoxib] Other (See Comments)    dyspepsia    Current Problems (verified) has OSA and COPD overlap syndrome ([place]); Essential hypertension; Atrial fibrillation ([place]); Chronic diastolic heart failure (HCC); COPD (chronic obstructive pulmonary disease) ([place]); GERD; Diverticulosis of colon with hemorrhage; History of colonic polyps; BPH with obstruction/lower urinary tract symptoms; Rotator cuff tear, right; Obesity, morbid ([place]); DJD (degenerative joint disease); Allergic rhinitis; Hypothyroidism; Mixed hyperlipidemia;  Prediabetes; Vitamin D deficiency; Medication management; Hypertensive cardiovascular disease; Medicare annual wellness visit, subsequent; BMI 37.0-37.9, adult; and Aortic atherosclerosis ([place]) on his problem list.  Screening Tests Immunization History  Administered Date(s) Administered  . DT 07/26/2014  . DTaP 06/24/2003  . Influenza Split 04/24/2011, 03/23/2012, 03/10/2013  . Influenza Whole 03/23/2009, 03/23/2010  . Influenza, High Dose Seasonal PF 04/13/2014, 03/03/2016  . Influenza,inj,Quad PF,36+ Mos 03/01/2015  . Pneumococcal Polysaccharide-23 05/23/1994, 11/22/2001, 05/28/2010  . Tdap 06/24/2003  . Zoster 09/14/2013   Preventative care: Last colonoscopy: 2001, will not get another due to age Echo 2015 Stress test 2005  Prior vaccinations: TD or Tdap: 2016  Influenza: 2017 Pneumococcal: 2011 Prevnar13: DUE out of in the office.  Shingles/Zostavax: 2015  Names of Other Physician/Practitioners you currently use: 1. [facility] here for primary care 2. Dr. Katy Fitch, eye doctor, last visit  3. Dr. Felipa Eth, dentist, last visit  Patient Care Team: Unk Pinto, MD as PCP - General (Internal Medicine) Collene Gobble, MD as Consulting Physician (Pulmonary Disease) Inda Castle, MD as Consulting Physician (Gastroenterology) Thompson Grayer, MD as Consulting Physician (Cardiology)  Surgical: He  has a past surgical history that includes Cardiac catheterization (7.27.05); Tonsillectomy; Total knee arthroplasty; Nasal sinus surgery; Colonoscopy; Joint replacement (2001); Hernia repair (Bilateral); Shoulder arthroscopy; Polypectomy; and Toe Surgery (Right). Family His family history includes Diabetes in his father; Hypertension in his mother; Lung cancer in his brother. Social history  He reports that he quit smoking about 37 years ago. His smoking use included Cigarettes. He has a 20.00 pack-year smoking history. He has never used smokeless  tobacco. He reports that he drinks alcohol. He reports that he does not use drugs.  MEDICARE WELLNESS OBJECTIVES: Physical activity: Current Exercise Habits: Structured exercise class, Time (Minutes): 30, Frequency (Times/Week): 7, Weekly Exercise (Minutes/Week): 210, Intensity: Mild Cardiac risk factors: Cardiac Risk Factors include: advanced age (>74men, >54 women);dyslipidemia;hypertension;male gender;sedentary lifestyle;obesity (BMI >30kg/m2) Depression/mood screen:   Depression screen [facility] 2/9 06/02/2016  Decreased Interest 0  Down, Depressed, Hopeless 0  PHQ - 2 Score 0    ADLs:  In your present state of health, do you have any difficulty performing the following activities: 06/02/2016 05/15/2016  Hearing? N (No Data)  Vision? N N  Difficulty concentrating or making decisions? N N  Walking or climbing stairs? Y Y  Dressing or bathing? N N  Doing errands, shopping? N N  Some recent data might be hidden    Cognitive Testing  Alert? Yes  Normal Appearance?Yes  Oriented to person? Yes  Place? Yes   Time? Yes  Recall of three objects?  Yes  Can perform simple calculations? Yes  Displays appropriate judgment?Yes  Can read the correct time from a watch face?Yes  EOL planning: Does Patient Have a Medical Advance Directive?: Yes Type of Advance Directive: Healthcare Power of Attorney, Living will Does patient want to make changes to medical advance directive?: No - Patient declined   Objective:   Today's Vitals   06/02/16 1431  BP: 132/70  Pulse: 78  Resp: 14  Temp: 97.3 F (36.3 C)  SpO2: 95%  Weight: 241 lb (109.3 kg)  Height: 5\' 7"  (1.702 m)  PainSc: 5    Body mass index is 37.75 kg/m.  General appearance: alert, no distress, WD/WN, male HEENT: normocephalic, sclerae anicteric, TMs pearly, nares patent, no discharge or erythema, pharynx normal Oral cavity: MMM, no lesions Neck: supple, no lymphadenopathy, no thyromegaly, no masses Heart: RRR, normal S1, S2, no  murmurs Lungs: CTA bilaterally, no wheezes, rhonchi, or rales Abdomen: +bs, soft, non tender, non distended, no masses, no hepatomegaly, no splenomegaly Musculoskeletal: nontender, no swelling, no obvious deformity Extremities: no edema, no cyanosis, no clubbing Pulses: 2+ symmetric, upper and lower extremities, normal cap refill Neurological: alert, oriented x 3, CN2-12 intact, strength normal upper extremities and lower extremities, sensation normal throughout, DTRs 2+ throughout, no cerebellar signs, gait slow/antalgic with a walker.  Psychiatric: normal affect, behavior normal, pleasant   Medicare Attestation I have personally reviewed: The patient's medical and social history Their use of alcohol, tobacco or illicit drugs Their current medications and supplements The patient's functional ability including ADLs,fall risks, home safety risks, cognitive, and hearing and visual impairment Diet and physical activities Evidence for depression or mood disorders  The patient's weight, height, BMI, and visual acuity have been recorded in the chart.  I have made referrals, counseling, and provided education to the patient based on review of the above and I have provided the patient with a written personalized care plan for preventive services.     Vicie Mutters, PA-C   06/02/2016

## 2016-06-03 ENCOUNTER — Ambulatory Visit (HOSPITAL_COMMUNITY)
Admission: RE | Admit: 2016-06-03 | Discharge: 2016-06-03 | Disposition: A | Discharge status: Home / Self Care | Payer: Medicare Other | Source: Ambulatory Visit | Attending: Physician Assistant | Admitting: Physician Assistant

## 2016-06-03 DIAGNOSIS — I517 Cardiomegaly: Secondary | ICD-10-CM | POA: Diagnosis not present

## 2016-06-03 DIAGNOSIS — J449 Chronic obstructive pulmonary disease, unspecified: Secondary | ICD-10-CM | POA: Diagnosis not present

## 2016-06-03 DIAGNOSIS — R9389 Abnormal findings on diagnostic imaging of other specified body structures: Secondary | ICD-10-CM

## 2016-06-03 DIAGNOSIS — R05 Cough: Secondary | ICD-10-CM | POA: Diagnosis not present

## 2016-06-03 LAB — BASIC METABOLIC PANEL WITH GFR
BUN: 23 mg/dL (ref 7–25)
CO2: 31 mmol/L (ref 20–31)
Calcium: 9.5 mg/dL (ref 8.6–10.3)
Chloride: 101 mmol/L (ref 98–110)
Creat: 0.79 mg/dL (ref 0.70–1.11)
GFR, EST NON AFRICAN AMERICAN: 83 mL/min (ref >=60)
GLUCOSE: 73 mg/dL (ref 65–99)
POTASSIUM: 4.6 mmol/L (ref 3.5–5.3)
SODIUM: 141 mmol/L (ref 135–146)

## 2016-06-03 LAB — HEPATIC FUNCTION PANEL
ALK PHOS: 94 U/L (ref 40–115)
ALT: 17 U/L (ref 9–46)
AST: 25 U/L (ref 10–35)
Albumin: 4.1 g/dL (ref 3.6–5.1)
BILIRUBIN DIRECT: 0.1 mg/dL (ref <=0.2)
BILIRUBIN INDIRECT: 0.4 mg/dL (ref 0.2–1.2)
TOTAL PROTEIN: 6.5 g/dL (ref 6.1–8.1)
Total Bilirubin: 0.5 mg/dL (ref 0.2–1.2)

## 2016-06-03 LAB — LIPID PANEL
CHOL/HDL RATIO: 2.1 ratio (ref <5.0)
CHOLESTEROL: 136 mg/dL (ref <200)
HDL: 64 mg/dL (ref >40)
LDL CALC: 56 mg/dL (ref <100)
TRIGLYCERIDES: 80 mg/dL (ref <150)
VLDL: 16 mg/dL (ref <30)

## 2016-06-03 LAB — MAGNESIUM: MAGNESIUM: 2.1 mg/dL (ref 1.5–2.5)

## 2016-07-10 ENCOUNTER — Ambulatory Visit: Payer: Medicare Other | Admitting: Podiatry

## 2016-07-16 ENCOUNTER — Ambulatory Visit: Payer: Medicare Other | Admitting: Neurology

## 2016-07-28 ENCOUNTER — Telehealth: Payer: Self-pay | Admitting: Emergency Medicine

## 2016-07-28 NOTE — Telephone Encounter (Signed)
Spoke with pt. He is unsure exactly what Medicare is requiring from him, but he know they stated they needed a follow up appointment. Since after 5 will hold message in Triage to contact them for more information

## 2016-07-29 NOTE — Telephone Encounter (Signed)
Spoke with pt.  Per [facility] he needs an appt to recert his equipment needs and also pt would like to discuss getting a new machine.  Appt made for 08/06/16 at 3:45..  Nothing further needed at this time.

## 2016-07-29 NOTE — Telephone Encounter (Signed)
lmtcb x1 for pt. 

## 2016-07-29 NOTE — Telephone Encounter (Signed)
Patient returned call - He can be reached at [phone]

## 2016-07-29 NOTE — Telephone Encounter (Signed)
Spoke with Raven at [employer].  They do not have record of sending a letter to pt.  Raven suggested that the pt call the billing office because this is most likely where the letter came from if it is to do with Medicare. I spoke with pt and gave him the number to [facility] billing office [phone]) .  Pt will call to see what they need and get back with [place].  Will await call back.

## 2016-07-31 ENCOUNTER — Ambulatory Visit (INDEPENDENT_AMBULATORY_CARE_PROVIDER_SITE_OTHER): Payer: Medicare Other | Admitting: Neurology

## 2016-07-31 ENCOUNTER — Encounter: Payer: Self-pay | Admitting: Neurology

## 2016-07-31 VITALS — BP 134/72 | HR 62 | Resp 22 | Ht 67.0 in | Wt 244.0 lb

## 2016-07-31 DIAGNOSIS — I5032 Chronic diastolic (congestive) heart failure: Secondary | ICD-10-CM | POA: Diagnosis not present

## 2016-07-31 DIAGNOSIS — R29898 Other symptoms and signs involving the musculoskeletal system: Secondary | ICD-10-CM | POA: Diagnosis not present

## 2016-07-31 DIAGNOSIS — R200 Anesthesia of skin: Secondary | ICD-10-CM

## 2016-07-31 DIAGNOSIS — M542 Cervicalgia: Secondary | ICD-10-CM

## 2016-07-31 DIAGNOSIS — R269 Unspecified abnormalities of gait and mobility: Secondary | ICD-10-CM

## 2016-07-31 DIAGNOSIS — M1990 Unspecified osteoarthritis, unspecified site: Secondary | ICD-10-CM | POA: Diagnosis not present

## 2016-07-31 NOTE — Progress Notes (Signed)
[facility]  PATIENT: Richard Rosales DOB: [birth year]  REFERRING DOCTOR OR PCP:  Unk Pinto SOURCE: patient, notes from Dr. Melford Aase,   _________________________________   HISTORICAL  CHIEF COMPLAINT:  Chief Complaint  Patient presents with  . Tremors    Richard Rosales is here for eval tremors in hands, bilat legs, worse with prolonged standing, onset about a yr. ago.   No relief with Sinemet.  Sts. while on Sinemet, he had 2 syncopal episodes and believes this was due to the Sinemet.  He also c/o chronic multiple joint pain, worse in hands.  Much generalized stiffness in the mornings.  "I think it's arthritis but I want to be sure."/fim  . Pain    HISTORY OF PRESENT ILLNESS:  I had the pleasure of seeing your patient, Richard Rosales, at [facility] for a neurologic consult regarding his poor gait, tremor and fatigue.     He has had progressive gait dysfunction over the last year and has been using a walker x 8 months.   He feels his balance is poor and legs feel weaker.   His legs start to tremble if he is up more than a few minutes.   He has has arthritis in his hands x several years,   He has more pain in his hands, much worse the past year.   He has had a mild tremor in his hands the past year.Marland Kitchen    He was referred to Dr. Carles Collet.   He was felt not to have classic parkinson's but a trial of Sinemet was not tolerated and he had syncope while on it.   He did not think it helped any.     He has had 5-6 falls the past year, all when he felt his balance was off.   He felt he locked up and was unable to catch himself.     He has numbness in his feet only, not passing the ankles, that has worsened over the past year or so.Marland Kitchen  His feet often hurt in the evenings and bedtime.      He is urinating more and has more nocturia.  He uses a urinal so he does not have to go all the way to the bathroom.   He is on furosemide so is not sure if frequency has increased.       He notes mild neck stiffness and he gets dizzy and lightheaded if he moves his head around, just while standing.    I personally reviewed the CT scan of the head from 2015. It shows mild atrophy and advanced chronic microvascular ischemic change with a lacunar infarction in the anterior internal capsule on the left. An MRI of the cervical spine 08/05/2003 reportedly showed multilevel degenerative spondylosis with a dense degenerative disc disease at C4-C5, C5-C6 and C6-C7 and left foraminal narrowing at C4-C5, bilateral foraminal narrowing at C5-C6 and left foraminal narrowing at C6-C7. Disc protrusion and bone spurs were encroaching on the left C7 nerve root.  REVIEW OF SYSTEMS: Constitutional: No fevers, chills, sweats, or change in appetite.   Some fatigue Eyes: No visual changes, double vision, eye pain Ear, nose and throat: No hearing loss, ear pain, nasal congestion, sore throat Cardiovascular: He has chronic A. fib. He has edema. Respiratory: No shortness of breath at rest or with exertion.  He has OSA and COPD. GastrointestinaI: No nausea, vomiting, diarrhea, abdominal pain, fecal incontinence.  He has GERD. Genitourinary: No dysuria, urinary retention or frequency.  No nocturia. Musculoskeletal: No neck pain, back pain Integumentary: No rash, pruritus, skin lesions Neurological: as above Psychiatric: No depression at this time.  No anxiety Endocrine: No palpitations, diaphoresis, change in appetite, change in weigh or increased thirst Hematologic/Lymphatic: No anemia, purpura, petechiae. Allergic/Immunologic: No itchy/runny eyes, nasal congestion, recent allergic reactions, rashes  ALLERGIES: Allergies  Allergen Reactions  . Ace Inhibitors Cough  . Beta Adrenergic Blockers Other (See Comments)    bradycardia  . Decadron [Dexamethasone]     unknown  . Fluticasone-Salmeterol     Doesn't remember  . Hytrin [Terazosin]     Nasal congestion   . Prednisone     Shakes /  tremors  . Vioxx [Rofecoxib] Other (See Comments)    dyspepsia    HOME MEDICATIONS:  Current Outpatient Prescriptions:  .  acetaminophen-codeine (TYLENOL #3) 300-30 MG tablet, Take 1 tablet by mouth every 4 (four) hours as needed for moderate pain., Disp: 180 tablet, Rfl: 3 .  albuterol (PROVENTIL HFA;VENTOLIN HFA) 108 (90 Base) MCG/ACT inhaler, Inhale 2 puffs into the lungs every 6 (six) hours as needed for wheezing or shortness of breath., Disp: 1 Inhaler, Rfl: 2 .  albuterol (PROVENTIL) (2.5 MG/3ML) 0.083% nebulizer solution, Take 3 mLs (2.5 mg total) by nebulization every 4 (four) hours., Disp: 300 mL, Rfl: 11 .  amoxicillin (AMOXIL) 500 MG capsule, as directed. (DENTAL), Disp: , Rfl:  .  aspirin 81 MG tablet, Take 1 tablet 3 days per week, Disp: , Rfl:  .  azelastine (ASTELIN) 0.1 % nasal spray, Place 1 spray into both nostrils 2 (two) times daily., Disp: 90 mL, Rfl: 1 .  b complex vitamins tablet, Take 1 tablet by mouth every morning. , Disp: , Rfl:  .  budesonide-formoterol (SYMBICORT) 160-4.5 MCG/ACT inhaler, Inhale 2 puffs into the lungs 2 (two) times daily., Disp: 3 Inhaler, Rfl: 3 .  chlorpheniramine (CHLOR-TRIMETON) 4 MG tablet, Take 1 tablet by mouth in the morning and 1 tablet at bedtime, Disp: , Rfl:  .  Cholecalciferol (VITAMIN D) 2000 UNITS CAPS, Take 2 capsules by mouth daily. , Disp: , Rfl:  .  DILT-XR 240 MG 24 hr capsule, TAKE 1 CAPSULE TWICE A DAY, Disp: 180 capsule, Rfl: 3 .  docusate sodium (COLACE) 100 MG capsule, Take 100 mg by mouth 2 (two) times daily.  , Disp: , Rfl:  .  doxycycline (VIBRAMYCIN) 100 MG capsule, Take 1 capsule daily with food for skin infection, Disp: 30 capsule, Rfl: 1 .  fish oil-omega-3 fatty acids 1000 MG capsule, Take 1 g by mouth 2 (two) times daily.  , Disp: , Rfl:  .  furosemide (LASIX) 40 MG tablet, Take 40 mg by mouth daily., Disp: , Rfl:  .  glucosamine-chondroitin 500-400 MG tablet, Take 1 tablet by mouth 1 day or 1 dose., Disp: , Rfl:    .  guaifenesin (HUMIBID E) 400 MG TABS, Take 400 mg by mouth 2 (two) times daily.  , Disp: , Rfl:  .  HYDROcodone-acetaminophen (NORCO/VICODIN) 5-325 MG per tablet, Take 1 tablet by mouth every 6 (six) hours as needed for moderate pain (Only takes during CT/MRI testing). , Disp: , Rfl:  .  levothyroxine (SYNTHROID, LEVOTHROID) 100 MCG tablet, Take 1 tablet (100 mcg total) by mouth daily before breakfast., Disp: 90 tablet, Rfl: 4 .  losartan (COZAAR) 100 MG tablet, Take 100 mg by mouth daily., Disp: , Rfl:  .  omeprazole (PRILOSEC) 40 MG capsule, Take 40 mg by mouth 2 (two) times daily.,  Disp: , Rfl:  .  SPIRIVA HANDIHALER 18 MCG inhalation capsule, INHALE THE CONTENTS OF 1 CAPSULE DAILY, Disp: 90 capsule, Rfl: 1  PAST MEDICAL HISTORY: Past Medical History:  Diagnosis Date  . Acute bronchitis   . Adenomatous colon polyp   . Allergic rhinitis   . Anemia    iron deficient  . Atrial fibrillation ([place])   . Atrial flutter ([place])   . Chronic gastritis   . COPD (chronic obstructive pulmonary disease) ([place])   . Diastolic dysfunction   . Diverticulosis of colon    recurrent GI bleeding  . DJD (degenerative joint disease)   . GERD (gastroesophageal reflux disease)   . GI bleed   . HTN (hypertension)   . Hyperplastic colon polyp 2003  . Hypothyroidism   . Moderate aortic insufficiency   . OSA (obstructive sleep apnea)    compliants w/ CPAP  . Positive PPD    remote  . Shortness of breath   . Venous insufficiency     PAST SURGICAL HISTORY: Past Surgical History:  Procedure Laterality Date  . CARDIAC CATHETERIZATION  7.27.05   revealed a preserved EF with no CAD  . COLONOSCOPY    . HERNIA REPAIR Bilateral   . JOINT REPLACEMENT  2001   lt total knee-rt one done 00  . NASAL SINUS SURGERY    . POLYPECTOMY     adenomatous polyps 2010  . SHOULDER ARTHROSCOPY    . TOE SURGERY Right   . TONSILLECTOMY    . TOTAL KNEE ARTHROPLASTY     bilateral    FAMILY HISTORY: Family History   Problem Relation Age of Onset  . Hypertension Mother   . Diabetes Father   . Lung cancer Brother     chemical    SOCIAL HISTORY:  Social History   Social History  . Marital status: Married    Spouse name: N/A  . Number of children: 4  . Years of education: N/A   Occupational History  . part-time as a Theme park manager at [employer]  . Smoking status: Former Smoker    Packs/day: 1.00    Years: 20.00    Types: Cigarettes    Quit date: 08/01/1978  . Smokeless tobacco: Never Used  . Alcohol use 0.0 oz/week     Comment: small glass of wine daily  . Drug use: No  . Sexual activity: Not on file   Other Topics Concern  . 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     PHYSICAL EXAM  Vitals:   07/31/16 1319  BP: 134/72  Pulse: 62  Resp: (!) 22  Weight: 244 lb (110.7 kg)  Height: 5\' 7"  (1.702 m)    Body mass index is 38.22 kg/m.   General: The patient is well-developed and well-nourished and in no acute distress  Neck: The neck is supple, no definite carotid bruits noted - cardiac murmur radiates.  The neck is mildly tender with reduced range of motion..  Cardiovascular: The heart has an irregular rate and rhythm with a normal S1 and S2. There was a murmur.   Lungs clear.  No wheezes.  Skin: Extremities are without rash.  He has edema.  Musculoskeletal:  Back is nontender  Neurologic Exam  Mental status: The patient is alert and oriented x 3 at the  time of the examination. The patient has apparent normal recent and remote memory, with an apparently normal attention span and concentration ability.   Speech is normal.  Cranial nerves: Extraocular movements are full. Pupils are equal, round, and reactive to light and accomodation.   There is good facial sensation to soft touch bilaterally.Facial  strength is normal.  Trapezius and sternocleidomastoid strength is normal. No dysarthria is noted.  The tongue is midline, and the patient has symmetric elevation of the soft palate. No obvious hearing deficits are noted.  Motor:  Muscle bulk is normal.   Tone is normal. Strength is  4/5 bilateral triceps and left biceps,  4+/5 intrinsic hand muscles on the right (median and ulnar) and 4/5 in intrinsic foot muscles  Sensory: Sensory testing is intact to pinprick, soft touch and vibration sensation in hands but reduced touch at toes and reduced vibration at ankles, nearly absent in toes  Coordination: Cerebellar testing reveals good finger-nose-finger and reduced heel-to-shin bilaterally.  Gait and station: Station is normal.  He uses arms to stand up.  His gait is in stride and mildly wide. He can turn in 3 steps.  He cannot tandem walk..  Tandem gait is normal. Romberg is negative.   Reflexes: Deep tendon reflexes are symmetric and normal in arms.  Knee reflexes are 2 but there appears to be mild spread. Ankle reflexes are absent. Plantar responses are flexor.    DIAGNOSTIC DATA (LABS, IMAGING, TESTING) - I reviewed patient records, labs, notes, testing and imaging myself where available.  Lab Results  Component Value Date   WBC 8.0 06/02/2016   HGB 13.9 06/02/2016   HCT 42.4 06/02/2016   MCV 93.6 06/02/2016   PLT 203 06/02/2016      Component Value Date/Time   NA 141 06/02/2016 1507   K 4.6 06/02/2016 1507   CL 101 06/02/2016 1507   CO2 31 06/02/2016 1507   GLUCOSE 73 06/02/2016 1507   BUN 23 06/02/2016 1507   CREATININE 0.79 06/02/2016 1507   CALCIUM 9.5 06/02/2016 1507   PROT 6.5 06/02/2016 1507   ALBUMIN 4.1 06/02/2016 1507   AST 25 06/02/2016 1507   ALT 17 06/02/2016 1507   ALKPHOS 94 06/02/2016 1507   BILITOT 0.5 06/02/2016 1507   GFRNONAA 83 06/02/2016 1507   GFRAA >89 06/02/2016 1507   Lab Results  Component Value Date   CHOL 136 06/02/2016   HDL 64 06/02/2016     LDLCALC 56 06/02/2016   TRIG 80 06/02/2016   CHOLHDL 2.1 06/02/2016   Lab Results  Component Value Date   HGBA1C 5.3 06/02/2016   Lab Results  Component Value Date   VITAMINB12 707 02/05/2015   Lab Results  Component Value Date   TSH 3.41 06/02/2016       ASSESSMENT AND PLAN  Gait disturbance - Plan: NCV with EMG(electromyography)  Neck pain - Plan: DG Cervical Spine Complete  Arm weakness - Plan: NCV with EMG(electromyography), DG Cervical Spine Complete  Numbness - Plan: NCV with EMG(electromyography)  Osteoarthritis, unspecified osteoarthritis type, unspecified site  Chronic diastolic heart failure ([place])   In summary, Richard Rosales is an 83 year old man who has had progressive gait dysfunction over the past year. The etiology appears to be multifactorial. He has chronic microvascular ischemic changes on the CT scan, significant degenerative changes in the spine and possible polyneuropathy. Cervical degenerative changes might also lead to spinal stenosis which could also be contributing.   Additionally, he has weakness in the right  arm that could be due to cervical radiculpathy or to median and ulnar neuropathies.   As he has congestive heart failure ad GERD he is unable to lay down long enough for an MRI to better evaluate the neck. I will check a cervical spine x-ray to get a better feel for the severity of his cervical spine degenerative changes and consider a CT scan in the future if severe. I'll check a nerve conduction study with EMG to evaluate for the possibility of polyneuropathy and also to determine if there is a superimposed median and ulnar are neuropathy or a cervical radiculopathy.   If polyneuropathy is found, we'll check some lab work to evaluate. I do not think there is a superimposed Parkinson's disease and no medical therapy is warranted at this time.  He will return to see me for the EMG but call sooner if there are new or worsening neurologic  symptoms.   Richard A. Felecia Shelling, MD, PhD [id number], 0000000 PM Certified in Neurology, Clinical Neurophysiology, Sleep Medicine, Pain Medicine and Neuroimaging  [facility] [address] [place], [place] [address] [phone]

## 2016-08-06 ENCOUNTER — Ambulatory Visit (INDEPENDENT_AMBULATORY_CARE_PROVIDER_SITE_OTHER): Payer: Medicare Other | Admitting: Emergency Medicine

## 2016-08-06 ENCOUNTER — Encounter: Payer: Self-pay | Admitting: Emergency Medicine

## 2016-08-06 DIAGNOSIS — J449 Chronic obstructive pulmonary disease, unspecified: Secondary | ICD-10-CM | POA: Diagnosis not present

## 2016-08-06 DIAGNOSIS — J301 Allergic rhinitis due to pollen: Secondary | ICD-10-CM | POA: Diagnosis not present

## 2016-08-06 DIAGNOSIS — J209 Acute bronchitis, unspecified: Secondary | ICD-10-CM | POA: Diagnosis not present

## 2016-08-06 DIAGNOSIS — G4733 Obstructive sleep apnea (adult) (pediatric): Secondary | ICD-10-CM | POA: Diagnosis not present

## 2016-08-06 MED ORDER — LEVOFLOXACIN 500 MG PO TABS: 500.0000 mg | ORAL_TABLET | Freq: Every day | ORAL | 0 refills | Status: DC

## 2016-08-06 NOTE — Patient Instructions (Addendum)
We will treat you with levaquin 500mg  daily for 7 days Please continue Spiriva and Symbicort as you have been taking them Call our office in 3-4 days if you are not starting to feel better. If your cough persists then we may need to give you prednisone. Continue your oxygen at 2L/min with exertion.  Continue you CPAP every night Follow in 2 weeks with APP to insure improving Follow with Dr Lamonte Sakai in 4 months or sooner if you have any problems.

## 2016-08-06 NOTE — Assessment & Plan Note (Signed)
Use CPAP every night as ordered. He is benefiting from it. He gets good sleep. He's more rested after using it.

## 2016-08-06 NOTE — Assessment & Plan Note (Signed)
astelin and chlorpheniramine

## 2016-08-06 NOTE — Assessment & Plan Note (Addendum)
Walking oximetry performed today confirmed desaturation. He needs 2 L/m by nasal cannula. Please continue Spiriva and Symbicort as you have been taking them Continue your oxygen at 2L/min with exertion.  Follow with Dr Lamonte Sakai in 4 months or sooner if you have any problems.

## 2016-08-06 NOTE — Assessment & Plan Note (Signed)
I do not hear any wheezing but does have upper airway noise a lot of cough. He is making 10 mucous. I like to treat him for acute bronchitis to see if this resolves. If he continues to have symptoms over the next several days then he may need prednisone. He wants to avoid this given the side effects if possible.

## 2016-08-06 NOTE — Addendum Note (Signed)
Addended by: Maryanna Shape A on: 08/06/2016 04:45 PM   Modules accepted: Orders

## 2016-08-06 NOTE — Progress Notes (Addendum)
Subjective:  Patient ID: Richard Rosales, male    DOB: 10-20-1930, 83y.o.   MRN: [id number]  83 yo man, former smoker (quit'80)  For FU of COPD &  OSA on CPAP 12 cm. He has  hx of HTN, A Fib, chronic diastolic dysfxn and LE edema, GERD, diverticulosis, remote positive PPD.    ROV 06/27/14 -- hx of Severe COPD with associated chronic bronchitis that frequently exacerbates. He has OSA and is compliant with CPAP. Last time he had sx consistent with acute bronchitis, was treated with pred + clarithro. He has dyspnea with activity, chores that requires him to rest. He is still coughing, gets a hoarse voice, has to clear secretions every am. He had severe mood swings with prednisone.   ROV 09/07/14 -- follow-up visit for Severe COPD with associated chronic bronchitis that frequently exacerbates. He has OSA and is compliant with CPAP. He was treated with levaquin and medrol beginning 09/05/14 - he has had more cough and sputum for the last 10-14 days. Since starting the levaquin and medrol he is doing a bit better, still with DOE and wheeze  ROV 03/01/15 -- follow-up visit for severe COPD, chronic bronchitis, obstructive sleep apnea on CPAP. He last saw TP in May.  He has oxygen that he uses with most activity, uses at home. He had a portable concentrator but didn't like it as much as the tanks. He is on symbicort and spiriva. Breathing appears to be stable at this time. Uses albuterol very rarely.   ROV 08/21/15 -- she has a history of severe COPD with chronic bronchitic phenotype. He also has OSA on CPAP. He is currently managed on Spiriva plus Symbicort. He is also using Astelin nasal spray qhs, Prilosec, Chlor-Trimeton, Humibid. He tells me that he had a recent URI 1 month ago, possibly the flu. He was treated with Tamiflu. Was not treated with pred or abx. He was told recently that he had parkinsonism, tremor. Is following w neuro. Has daily cough, secretions especially in the am. Has seen a streak of blood  before x 1.   ROV 11/08/15 -- follow-up visit for history of obstructive sleep apnea. Severe COPD with chronic bronchitis. He is currently managed on Spiriva and Symbicort. He is on chlorpheniramine bid, astelin qhs.  He is having a lot of nasal drainage, lots of cough with light or clear mucous. His exertional tolerance is stable, he has exertional SOB. Has started wearing his O2 again - 2L/min when sitting still, not always when exerting. Might use a backpack - [employer]. He has albuterol but does not use it.   ROV 04/22/16 -- patient has a history of COPD, chronic bronchitis, obesity and obstructive sleep apnea. He also has rhinitis with associated cough. He has good compliance with his CPAP, benefits from it. Currently managed on Spiriva and Symbicort. He has been on doxy for LE cellulitis, has also helped his mucous some. His rhinitis has ups and downs. Managed on chlorpheniramine, astelin. He uses albuterol rarely. Has not had an AE since last visit.   ROV 08/06/16 -- is a follow-up visit for patient with a history of COPD, obesity, chronic bronchitis and obstructive sleep apnea. He underwent walking O2 titration today, documented desats and titrated to 2l/min. He reports that he has felt weak for last 3 days, worsening cough that is occasionally productive, especially in the am.  Remains on chlorpheniramine, astelin NS. Remains on spiriva and symbicort. Uses albuterol very rarely. He reports that he has good  compliance with his CPAP. Also tells me that he needs a new machine because his is old, noisy, will likely need repair soon. He has had it for over 5 years.    Vitals:   08/06/16 1600  BP: 132/78  BP Location: Left Arm  Cuff Size: Normal  Pulse: 79  SpO2: 93%  Weight: 244 lb (110.7 kg)  Height: 5\' 7"  (1.702 m)   Gen: Pleasant, kyphotic, in no distress,  normal affect  ENT: No lesions,  mouth clear,  oropharynx clear, no postnasal drip, some nasal obstruction  Neck: No JVD, no TMG, no  carotid bruits  Lungs: No use of accessory muscles, clear without rales or rhonchi, very distant exp wheeze  Cardiovascular: RRR, heart sounds normal, no murmur or gallops, 1+ peripheral edema  Musculoskeletal: No deformities, no cyanosis or clubbing  Neuro: alert, non focal  Skin: Warm, no lesions or rashes      11/18/13 --  COMPARISON: No prior CT chest. Multiple prior chest x-rays, most  recently 11/17/2013.  FINDINGS:  Contrast opacification of the pulmonary arteries is excellent. No  filling defects within either main pulmonary artery or their  branches in either lung to suggest pulmonary embolism. Heart  markedly enlarged with left ventricular predominance and evidence of  left ventricular hypertrophy. Small pericardial effusion. Moderate 3  vessel coronary atherosclerosis. Severe atherosclerosis involving  the thoracic and upper abdominal aorta and their visualized branches  without evidence of aneurysm. Tortuous thoracic and upper abdominal  aorta.  Emphysematous changes throughout both lungs. Subsegmental airspace  consolidation with air bronchograms in the deep posterior right  lower lobe. Lungs otherwise clear. No pulmonary parenchymal nodules  or masses. No pleural effusions.  Thyroid gland enlargement with multiple nodules, the largest nodules  adjacent to one another in the right lobe measuring approximately  2.5 x 2.2 x 3.1 cm in the mid pole and 3.0 x 3.0 x 2.1 cm in the  lower pole. Scattered normal size lymph nodes in the mediastinum,  hila, and axilla without significant lymphadenopathy. Mild bilateral  gynecomastia.  Irregularity involving the contour of the visualized liver with  relative enlargement of the left lobe. Cysts involving the mid  portions of the visualized kidneys. Moderate pancreatic atrophy.  Hiatal hernia containing predominately intra-abdominal fat.  Diverticulosis involving the visualized distal transverse and  proximal descending colon  without evidence of acute diverticulitis.  Bone window images demonstrated diffuse thoracic spondylosis and  exaggeration of the usual thoracic kyphosis.  Review of the MIP images confirms the above findings.   IMPRESSION:  1. No evidence of pulmonary embolism.  2. COPD/emphysema. Atelectasis involving the right lower lobe. No  acute cardiopulmonary disease otherwise.  3. Multinodular goiter, with adjacent 3 cm nodules in the right  lobe.  4. Hepatic cirrhosis.  5. Moderate pancreatic atrophy.  6. Diverticulosis involving the visualized distal transverse and  proximal descending colon.  7. Cardiomegaly with 3 vessel coronary artery disease. Left  ventricular hypertrophy. Small pericardial effusion.  8. Mild bilateral gynecomastia   Assessment & Plan:   OSA and COPD overlap syndrome (H[facility]) Use CPAP every night as ordered. He is benefiting from it. He gets good sleep. He's more rested after using it.  COPD (chronic obstructive pulmonary disease) Walking oximetry performed today confirmed desaturation. He needs 2 L/m by nasal cannula. Please continue Spiriva and Symbicort as you have been taking them Continue your oxygen at 2L/min with exertion.  Follow with Dr Lamonte Sakai in 4 months or sooner if you  have any problems.  Allergic rhinitis astelin and chlorpheniramine   Acute bronchitis I do not hear any wheezing but does have upper airway noise a lot of cough. He is making 10 mucous. I like to treat him for acute bronchitis to see if this resolves. If he continues to have symptoms over the next several days then he may need prednisone. He wants to avoid this given the side effects if possible.    Baltazar Apo, MD, PhD 08/06/2016, 4:28 PM [facility] [phone] or if no answer [phone]

## 2016-08-11 ENCOUNTER — Telehealth: Payer: Self-pay | Admitting: *Deleted

## 2016-08-11 NOTE — Telephone Encounter (Signed)
Patient called requesting appointment dates/times for x ray, EMG-NCS, FU with Dr Felecia Shelling. Given all information and given number to [place]. He verbalized understanding, appreciation.

## 2016-08-14 ENCOUNTER — Telehealth: Payer: Self-pay | Admitting: Emergency Medicine

## 2016-08-14 NOTE — Telephone Encounter (Signed)
No other options for steroids.   He can try OTC decongestant like tylenol cold and flu, theraflu OK to use delsym for cough OK to use mucinex if needed to help clear congestion.

## 2016-08-14 NOTE — Telephone Encounter (Signed)
I believe he needs to take a pred taper as follows: Take 40mg  daily for 3 days, then 30mg  daily for 3 days, then 20mg  daily for 3 days, then 10mg  daily for 3 days, then stop

## 2016-08-14 NOTE — Telephone Encounter (Signed)
Spoke with pt. He is aware of RB's recommendations. Nothing further was needed.

## 2016-08-14 NOTE — Telephone Encounter (Signed)
Called and spoke with pt and he was advised of [place].  Pt stated that he cannot take the prednisone.  He stated that this is listed in his chart under his allergies.  He stated that this makes him have worse tremors and angry.  Pt is requesting something else RB.  Please advise.  Thanks.    Allergies  Allergen Reactions  . Ace Inhibitors Cough  . Beta Adrenergic Blockers Other (See Comments)    bradycardia  . Decadron [Dexamethasone]     unknown  . Fluticasone-Salmeterol     Doesn't remember  . Hytrin [Terazosin]     Nasal congestion   . Prednisone     Shakes / tremors  . Vioxx [Rofecoxib] Other (See Comments)    dyspepsia    He stated that the medrol has the same effect.

## 2016-08-14 NOTE — Telephone Encounter (Signed)
Spoke with pt. States that he is improved from his last OV but he is not back to 100%. Still reports cough, chest tightness, wheezing and SOB. Cough is producing clear mucus. Denies fever/chills/sweats. At his last OV he was given Levaquin 500mg  x7 days, he has finished this. Pt would like RB's recommendations.  RB - please advise. Thanks.

## 2016-08-16 ENCOUNTER — Other Ambulatory Visit: Payer: Self-pay | Admitting: Emergency Medicine

## 2016-08-21 ENCOUNTER — Ambulatory Visit (INDEPENDENT_AMBULATORY_CARE_PROVIDER_SITE_OTHER): Payer: Medicare Other | Admitting: Adult Health

## 2016-08-21 ENCOUNTER — Encounter: Payer: Self-pay | Admitting: Adult Health

## 2016-08-21 DIAGNOSIS — J441 Chronic obstructive pulmonary disease with (acute) exacerbation: Secondary | ICD-10-CM

## 2016-08-21 DIAGNOSIS — J449 Chronic obstructive pulmonary disease, unspecified: Secondary | ICD-10-CM

## 2016-08-21 DIAGNOSIS — G4733 Obstructive sleep apnea (adult) (pediatric): Secondary | ICD-10-CM | POA: Diagnosis not present

## 2016-08-21 NOTE — Assessment & Plan Note (Signed)
Recent flare now resolving   Plan  Patient Instructions  Continue on current regimen .  Follow up Dr. Lamonte Sakai  In 3 months and As needed

## 2016-08-21 NOTE — Progress Notes (Signed)
@Patient  ID: Richard Rosales, male    DOB: February 25, 1931, 83 y.o.   MRN: [id number]  Chief Complaint  Patient presents with  . Follow-up    COPD    Referring provider: Unk Pinto, MD  HPI: 83 yo man, former smoker (quit'80)  For FU of COPD &  OSA on CPAP 12 cm. He has  hx of HTN, A Fib, chronic diastolic dysfxn and LE edema, GERD, diverticulosis, remote positive PPD.   08/21/2016 Follow up : COPD /O2 RF  Pt presents for a 2 week follow up . He was seen last visit for COPD flare , tx w/ Levaquin x 1 week. He is feeling better w/ less cough and congestioin.  He remains on Spiriva and Symbicort .  Denies hemoptysis  , chest pain or edema.   Remains on o2 2l/m activity . Wears it when he feels he needs it.   Wears CPAP At bedtime  . Feels rested.  Cant sleep without it.     Allergies  Allergen Reactions  . Ace Inhibitors Cough  . Beta Adrenergic Blockers Other (See Comments)    bradycardia  . Decadron [Dexamethasone]     unknown  . Fluticasone-Salmeterol     Doesn't remember  . Hytrin [Terazosin]     Nasal congestion   . Prednisone     Shakes / tremors  . Vioxx [Rofecoxib] Other (See Comments)    dyspepsia    Immunization History  Administered Date(s) Administered  . DT 07/26/2014  . DTaP 06/24/2003  . Influenza Split 04/24/2011, 03/23/2012, 03/10/2013  . Influenza Whole 03/23/2009, 03/23/2010  . Influenza, High Dose Seasonal PF 04/13/2014, 03/03/2016  . Influenza,inj,Quad PF,36+ Mos 03/01/2015  . Pneumococcal Polysaccharide-23 05/23/1994, 11/22/2001, 05/28/2010  . Tdap 06/24/2003  . Zoster 09/14/2013    Past Medical History:  Diagnosis Date  . Acute bronchitis   . Adenomatous colon polyp   . Allergic rhinitis   . Anemia    iron deficient  . Atrial fibrillation ([place])   . Atrial flutter ([place])   . Chronic gastritis   . COPD (chronic obstructive pulmonary disease) ([place])   . Diastolic dysfunction   . Diverticulosis of colon    recurrent GI bleeding    . DJD (degenerative joint disease)   . GERD (gastroesophageal reflux disease)   . GI bleed   . HTN (hypertension)   . Hyperplastic colon polyp 2003  . Hypothyroidism   . Moderate aortic insufficiency   . OSA (obstructive sleep apnea)    compliants w/ CPAP  . Positive PPD    remote  . Shortness of breath   . Venous insufficiency     Tobacco History: History  Smoking Status  . Former Smoker  . Packs/day: 1.00  . Years: 20.00  . Types: Cigarettes  . Quit date: 08/01/1978  Smokeless Tobacco  . Never Used   Counseling given: Not Answered   Outpatient Encounter Prescriptions as of 08/21/2016  Medication Sig  . acetaminophen-codeine (TYLENOL #3) 300-30 MG tablet Take 1 tablet by mouth every 4 (four) hours as needed for moderate pain.  Marland Kitchen albuterol (PROVENTIL HFA;VENTOLIN HFA) 108 (90 Base) MCG/ACT inhaler Inhale 2 puffs into the lungs every 6 (six) hours as needed for wheezing or shortness of breath.  Marland Kitchen albuterol (PROVENTIL) (2.5 MG/3ML) 0.083% nebulizer solution Take 3 mLs (2.5 mg total) by nebulization every 4 (four) hours.  Marland Kitchen amoxicillin (AMOXIL) 500 MG capsule as directed. (DENTAL)  . aspirin 81 MG tablet Take 1 tablet 3 days  per week  . azelastine (ASTELIN) 0.1 % nasal spray Place 1 spray into both nostrils 2 (two) times daily.  Marland Kitchen b complex vitamins tablet Take 1 tablet by mouth every morning.   . budesonide-formoterol (SYMBICORT) 160-4.5 MCG/ACT inhaler Inhale 2 puffs into the lungs 2 (two) times daily.  . chlorpheniramine (CHLOR-TRIMETON) 4 MG tablet Take 1 tablet by mouth in the morning and 1 tablet at bedtime  . Cholecalciferol (VITAMIN D) 2000 UNITS CAPS Take 2 capsules by mouth daily.   Marland Kitchen DILT-XR 240 MG 24 hr capsule TAKE 1 CAPSULE TWICE A DAY  . docusate sodium (COLACE) 100 MG capsule Take 100 mg by mouth 2 (two) times daily.    . fish oil-omega-3 fatty acids 1000 MG capsule Take 1 g by mouth 2 (two) times daily.    . furosemide (LASIX) 40 MG tablet Take 40 mg by mouth  daily.  Marland Kitchen glucosamine-chondroitin 500-400 MG tablet Take 1 tablet by mouth 1 day or 1 dose.  Marland Kitchen guaifenesin (HUMIBID E) 400 MG TABS Take 400 mg by mouth 2 (two) times daily.    Marland Kitchen HYDROcodone-acetaminophen (NORCO/VICODIN) 5-325 MG per tablet Take 1 tablet by mouth every 6 (six) hours as needed for moderate pain (Only takes during CT/MRI testing).   Marland Kitchen levothyroxine (SYNTHROID, LEVOTHROID) 100 MCG tablet Take 1 tablet (100 mcg total) by mouth daily before breakfast.  . losartan (COZAAR) 100 MG tablet Take 100 mg by mouth daily.  Marland Kitchen omeprazole (PRILOSEC) 40 MG capsule Take 40 mg by mouth 2 (two) times daily.  Marland Kitchen SPIRIVA HANDIHALER 18 MCG inhalation capsule INHALE THE CONTENTS OF 1 CAPSULE DAILY  . [DISCONTINUED] levofloxacin (LEVAQUIN) 500 MG tablet Take 1 tablet (500 mg total) by mouth daily. (Patient not taking: Reported on 08/21/2016)   No facility-administered encounter medications on file as of 08/21/2016.      Review of Systems  Constitutional:   No  weight loss, night sweats,  Fevers, chills, +atigue, or  lassitude.  HEENT:   No headaches,  Difficulty swallowing,  Tooth/dental problems, or  Sore throat,                No sneezing, itching, ear ache, nasal congestion, post nasal drip,   CV:  No chest pain,  Orthopnea, PND,   anasarca, dizziness, palpitations, syncope.   GI  No heartburn, indigestion, abdominal pain, nausea, vomiting, diarrhea, change in bowel habits, loss of appetite, bloody stools. d  Resp:   No chest wall deformity  Skin: no rash or lesions.  GU: no dysuria, change in color of urine, no urgency or frequency.  No flank pain, no hematuria   MS:  No joint pain or swelling.  No decreased range of motion.  No back pain.    Physical Exam  BP 112/80 (BP Location: Right Arm, Cuff Size: Normal)   Pulse 73   Ht 5\' 7"  (1.702 m)   Wt 242 lb 9.6 oz (110 kg)   SpO2 93%   BMI 38.00 kg/m   GEN: A/Ox3; pleasant , NAD, elderly, frail walks with walker    HEENT:  [place]/AT,   EACs-clear, TMs-wnl, NOSE-clear, THROAT-clear, no lesions, no postnasal drip or exudate noted.   NECK:  Supple w/ fair ROM; no JVD; normal carotid impulses w/o bruits; no thyromegaly or nodules palpated; no lymphadenopathy.    RESP  Decreased BS in bases  no accessory muscle use, no dullness to percussion  CARD:  RRR, no m/r/g, 2+ peripheral edema/TEDS , pulses intact, no cyanosis or  clubbing.  GI:   Soft & nt; nml bowel sounds; no organomegaly or masses detected.   Musco: Warm bil, no deformities or joint swelling noted.   Neuro: alert, no focal deficits noted.    Skin: Warm, no lesions or rashes    BNP  Assessment & Plan:   No problem-specific Assessment & Plan notes found for this encounter.     Rexene Edison, NP 08/21/2016

## 2016-08-21 NOTE — Patient Instructions (Signed)
Continue on current regimen .  Follow up Dr. Byrum  In 3 months and As needed    

## 2016-08-21 NOTE — Assessment & Plan Note (Signed)
Cont on CPAP At bedtime  

## 2016-09-03 ENCOUNTER — Telehealth: Payer: Self-pay | Admitting: Neurology

## 2016-09-03 NOTE — Telephone Encounter (Signed)
Pt's wife called back. Msg relayed and phone # given  [phone]

## 2016-09-03 NOTE — Telephone Encounter (Signed)
Home # rang with no answer, no ans. machine.  LMOM cell phone that due to pt's inability to lie still long enough for an MRI, x-rays were ordered instead.  They do not need an appt. for these to be done at [facility]

## 2016-09-03 NOTE — Telephone Encounter (Signed)
I don't see a MRI order I see a DG cervical spine.

## 2016-09-03 NOTE — Telephone Encounter (Signed)
Richard Rosales called saying her husband was told that a MRI would be scheduled but they have not been contacted by [facility].  Richard Kainz is asking for a call with appointment date and time please

## 2016-09-03 NOTE — Telephone Encounter (Signed)
noted/fim 

## 2016-09-18 ENCOUNTER — Ambulatory Visit
Admission: RE | Admit: 2016-09-18 | Discharge: 2016-09-18 | Disposition: A | Discharge status: Home / Self Care | Payer: Medicare Other | Source: Ambulatory Visit | Attending: Neurology | Admitting: Neurology

## 2016-09-18 DIAGNOSIS — M542 Cervicalgia: Secondary | ICD-10-CM | POA: Diagnosis not present

## 2016-09-18 DIAGNOSIS — R29898 Other symptoms and signs involving the musculoskeletal system: Secondary | ICD-10-CM

## 2016-09-22 ENCOUNTER — Telehealth: Payer: Self-pay | Admitting: *Deleted

## 2016-09-22 ENCOUNTER — Other Ambulatory Visit: Payer: Self-pay | Admitting: Emergency Medicine

## 2016-09-22 NOTE — Telephone Encounter (Signed)
-----   Message from Britt Bottom, MD sent at 09/18/2016  5:38 PM EDT ----- Please let him know that the neck x-ray does show a lot of arthritis in his neck. I will discuss further when he comes to the office in 2 weeks for his EMG.

## 2016-09-22 NOTE — Telephone Encounter (Signed)
I have spoken with [place] this afternoon and per RAS, reviewed x-ray results as below.  He verbalized understanding of same/fim

## 2016-09-24 ENCOUNTER — Telehealth: Payer: Self-pay | Admitting: Emergency Medicine

## 2016-09-24 DIAGNOSIS — G4733 Obstructive sleep apnea (adult) (pediatric): Secondary | ICD-10-CM

## 2016-09-24 DIAGNOSIS — J449 Chronic obstructive pulmonary disease, unspecified: Principal | ICD-10-CM

## 2016-09-24 NOTE — Telephone Encounter (Signed)
Jonni Sanger from [facility] returned phone call, contact # [phone]....Marland KitchenMarland KitchenMearl Latin

## 2016-09-24 NOTE — Telephone Encounter (Signed)
Spoke with Jonni Sanger, he will contact the pt and inform them that we need a download from his machine and then have this faxed to our office.

## 2016-09-24 NOTE — Telephone Encounter (Signed)
RB  Please Advise-  Pt saw you on 08/06/16 and he stated he asked during this visit for an order to be placed for a new cpap machine. Please advise if this is ok.

## 2016-09-25 NOTE — Telephone Encounter (Signed)
Yes this is Ok - I thought we did it that day

## 2016-09-25 NOTE — Telephone Encounter (Signed)
Order has been placed for the pt to get a new cpap machine.

## 2016-10-01 ENCOUNTER — Ambulatory Visit (INDEPENDENT_AMBULATORY_CARE_PROVIDER_SITE_OTHER): Payer: Medicare Other | Admitting: Neurology

## 2016-10-01 ENCOUNTER — Encounter: Payer: Self-pay | Admitting: Internal Medicine

## 2016-10-01 ENCOUNTER — Ambulatory Visit (INDEPENDENT_AMBULATORY_CARE_PROVIDER_SITE_OTHER): Payer: Self-pay | Admitting: Neurology

## 2016-10-01 DIAGNOSIS — G629 Polyneuropathy, unspecified: Secondary | ICD-10-CM | POA: Diagnosis not present

## 2016-10-01 DIAGNOSIS — R200 Anesthesia of skin: Secondary | ICD-10-CM | POA: Diagnosis not present

## 2016-10-01 DIAGNOSIS — R269 Unspecified abnormalities of gait and mobility: Secondary | ICD-10-CM

## 2016-10-01 DIAGNOSIS — Z0289 Encounter for other administrative examinations: Secondary | ICD-10-CM

## 2016-10-01 DIAGNOSIS — R29898 Other symptoms and signs involving the musculoskeletal system: Secondary | ICD-10-CM

## 2016-10-01 NOTE — Progress Notes (Addendum)
Full Name: Richard Rosales Gender: Male MRN #: [id number] Date of Birth: 18-Jul-1930    Visit Date: 10/01/2016 14:10 Age: 83 Years 72 Months Old Examining Physician: Arlice Colt, MD  Referring Physician: Felecia Rosales     History:    He is an 83 year old man with progressive history of a gait disturbance associated with leg numbness and weakness. He also has severe edema in both legs.  Nerve conduction studies: In the right leg, the sural sensory, superficial peroneal and tibial motor studies were nonresponsive.  In the right arm, the median motor response had a mildly reduced amplitude and mildly prolonged distal latency with slightly reduced forearm conduction velocity.. The right ulnar motor response had mildly reduced amplitudes with normal distal latencies and forearm conduction but reduced conduction velocity across the elbow. There was no temporal dispersion.. The right radial and right median sensory responses had mildly delayed peak latencies with reduced amplitudes. The ulnar sensory response had a normal latency but reduced amplitude.  Electromyography: Needle EMG was performed in muscles of the right arm and right leg. In the right arm, was mild chronic innervation noted within the biceps and a few polyphasic units noted within the triceps. Other muscles were essentially normal. There is no abnormal spontaneous activity.  The right leg, there were positive sharp waves and fibrillation potentials in the gastrocnemius muscle.   Motor unit morphology showed an increased number of polyphasic motor units with early recruitment. In the iliopsoas, vastus medialis, tibialis anterior and peroneus longus muscles, there were an increase number of polyphasic motor units with early recruitment potential. There was no abnormal spontaneous activity in these muscles.  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 Some evidence of ongoing denervation in the gastrocnemius muscle, the most distal muscle tested. Mild superimposed chronic lumbosacral radiculopathies cannot be ruled out.   Richard Rosales A. Richard Shelling, MD, PhD Certified in Neurology, Clinical Neurophysiology, Sleep Medicine, Pain Medicine and Neuroimaging  [facility] [address] [place], [place] [address] [phone]  Addendum:   ESR, ANA, SPEP/IFE and anti-MAG laboratory tests were ordered.        [place]    Nerve / Sites Rec. Site Latency Ref. Amplitude Ref. Rel Amp Segments Distance Velocity Ref. Area    ms ms mV mV %  cm m/s m/s mVms  R Median - APB     Wrist APB 4.8 ?4.4 3.2 ?4.0 100 Wrist - APB 7   14.4     Upper arm APB 10.8  2.9  90.6 Upper arm - Wrist 27 45 ?49 14.0  R Ulnar - ADM     Wrist ADM 2.8 ?3.3 5.1 ?6.0 100 Wrist - ADM 7   20.6     B.Elbow ADM 6.1  4.2  82.2 B.Elbow - Wrist 17 52 ?49 16.1     A.Elbow ADM 8.9  3.9  92.2 A.Elbow - B.Elbow 12 43 ?49 16.1         A.Elbow - Wrist      R Tibial - AH     Ankle AH NR ?5.8 NR ?4.0 NR Ankle - AH 9   NR           SNC    Nerve / Sites Rec. Site Peak Lat Ref.  Amp Ref. Segments Distance    ms ms V V  cm  R Radial - Anatomical snuff box (Forearm)  Forearm Wrist 3.13 ?2.90 8 ?15 Forearm - Wrist 10  R Sural - Ankle (Calf)     Calf Ankle NR ?4.40 NR ?6 Calf - Ankle 14  R Superficial peroneal - Ankle     Lat leg Ankle NR ?4.40 NR ?6 Lat leg - Ankle 14  R Median - Orthodromic (Dig II, Mid palm)     Dig II Wrist 3.85 ?3.40 3 ?10 Dig II - Wrist 13  R Ulnar - Orthodromic, (Dig V, Mid palm)     Dig V Wrist 2.86 ?3.10 2 ?5 Dig V - Wrist 54               F  Wave    Nerve F Lat Ref.   ms ms  R Ulnar - ADM 31.3 ?32.0       EMG full       EMG Summary Table    Spontaneous MUAP Recruitment  Muscle IA Fib PSW Fasc Other Amp Dur. Poly Pattern  R. Deltoid Normal None None None _______ Normal Normal 1+ Normal  R. Triceps brachii  Normal None None None _______ Normal Normal Normal Normal  R. Biceps brachii Normal None None None _______ Increased Increased 1+ Reduced  R. Extensor dig profundum Normal None None None _______ Normal Normal Normal Normal  R. FDIO Normal None None None  Normal Normal Normal Normal  R. Vastus medialis Normal None None None _______ Increased Increased 1+ Reduced  R. Tibialis anterior Normal None None None _______ Increased Increased 1+ Reduced  R. Peroneus longus Normal None None None _______ Increased Increased 1+ Reduced  R. Gastrocnemius (Medial head) Normal 1+ 1+ None _______ Increased Increased 1+ Reduced  R. Iliopsoas Normal None None None _______ Normal Normal 1+ Normal

## 2016-10-06 LAB — MULTIPLE MYELOMA PANEL, SERUM
ALBUMIN SERPL ELPH-MCNC: 3.7 g/dL (ref 2.9–4.4)
ALPHA2 GLOB SERPL ELPH-MCNC: 0.8 g/dL (ref 0.4–1.0)
Albumin/Glob SerPl: 1.3 (ref 0.7–1.7)
Alpha 1: 0.3 g/dL (ref 0.0–0.4)
B-GLOBULIN SERPL ELPH-MCNC: 1.3 g/dL (ref 0.7–1.3)
Gamma Glob SerPl Elph-Mcnc: 0.8 g/dL (ref 0.4–1.8)
Globulin, Total: 3 g/dL (ref 2.2–3.9)
IGG (IMMUNOGLOBIN G), SERUM: 814 mg/dL (ref 700–1600)
IGM (IMMUNOGLOBULIN M), SRM: 53 mg/dL (ref 15–143)
IgA/Immunoglobulin A, Serum: 475 mg/dL — ABNORMAL HIGH (ref 61–437)
TOTAL PROTEIN: 6.7 g/dL (ref 6.0–8.5)

## 2016-10-06 LAB — ANTI-MYELIN ASSOC. GLY. IGM: Anti-Myelin Assoc. Glycop.IgM*: <1:10 {titer}

## 2016-10-06 LAB — SEDIMENTATION RATE: Sed Rate: 4 mm/hr (ref 0–30)

## 2016-10-06 LAB — ANA W/REFLEX: ANA: NEGATIVE

## 2016-10-07 ENCOUNTER — Telehealth: Payer: Self-pay | Admitting: Neurology

## 2016-10-07 DIAGNOSIS — G63 Polyneuropathy in diseases classified elsewhere: Principal | ICD-10-CM

## 2016-10-07 DIAGNOSIS — G629 Polyneuropathy, unspecified: Secondary | ICD-10-CM

## 2016-10-07 DIAGNOSIS — D472 Monoclonal gammopathy: Secondary | ICD-10-CM | POA: Insufficient documentation

## 2016-10-07 NOTE — Telephone Encounter (Signed)
I spoke with Richard Rosales about the laboratory tests. He has an IgG lambda paraprotein. This might be due to MGUS which could also explain his neuropathy. I will refer him to hematology as he may need additional workup.

## 2016-10-16 ENCOUNTER — Ambulatory Visit (INDEPENDENT_AMBULATORY_CARE_PROVIDER_SITE_OTHER): Payer: Medicare Other | Admitting: Internal Medicine

## 2016-10-16 VITALS — BP 132/66 | HR 68 | Temp 97.7°F | Resp 20 | Ht 67.0 in | Wt 249.8 lb

## 2016-10-16 DIAGNOSIS — E559 Vitamin D deficiency, unspecified: Secondary | ICD-10-CM

## 2016-10-16 DIAGNOSIS — J449 Chronic obstructive pulmonary disease, unspecified: Secondary | ICD-10-CM

## 2016-10-16 DIAGNOSIS — N320 Bladder-neck obstruction: Secondary | ICD-10-CM | POA: Diagnosis not present

## 2016-10-16 DIAGNOSIS — I700 Atherosclerosis of aorta: Secondary | ICD-10-CM

## 2016-10-16 DIAGNOSIS — Z1212 Encounter for screening for malignant neoplasm of rectum: Secondary | ICD-10-CM

## 2016-10-16 DIAGNOSIS — E66812 Obesity, class 2: Secondary | ICD-10-CM

## 2016-10-16 DIAGNOSIS — G20C Parkinsonism, unspecified: Secondary | ICD-10-CM

## 2016-10-16 DIAGNOSIS — E039 Hypothyroidism, unspecified: Secondary | ICD-10-CM | POA: Diagnosis not present

## 2016-10-16 DIAGNOSIS — R7303 Prediabetes: Secondary | ICD-10-CM

## 2016-10-16 DIAGNOSIS — I482 Chronic atrial fibrillation, unspecified: Secondary | ICD-10-CM

## 2016-10-16 DIAGNOSIS — Z125 Encounter for screening for malignant neoplasm of prostate: Secondary | ICD-10-CM | POA: Diagnosis not present

## 2016-10-16 DIAGNOSIS — Z79899 Other long term (current) drug therapy: Secondary | ICD-10-CM | POA: Diagnosis not present

## 2016-10-16 DIAGNOSIS — Z6837 Body mass index (BMI) 37.0-37.9, adult: Secondary | ICD-10-CM

## 2016-10-16 DIAGNOSIS — R2681 Unsteadiness on feet: Secondary | ICD-10-CM

## 2016-10-16 DIAGNOSIS — I5032 Chronic diastolic (congestive) heart failure: Secondary | ICD-10-CM

## 2016-10-16 DIAGNOSIS — G20 Parkinson's disease: Secondary | ICD-10-CM

## 2016-10-16 DIAGNOSIS — I10 Essential (primary) hypertension: Secondary | ICD-10-CM

## 2016-10-16 DIAGNOSIS — Z1211 Encounter for screening for malignant neoplasm of colon: Secondary | ICD-10-CM

## 2016-10-16 DIAGNOSIS — Z136 Encounter for screening for cardiovascular disorders: Secondary | ICD-10-CM

## 2016-10-16 DIAGNOSIS — R601 Generalized edema: Secondary | ICD-10-CM

## 2016-10-16 DIAGNOSIS — E782 Mixed hyperlipidemia: Secondary | ICD-10-CM | POA: Diagnosis not present

## 2016-10-16 LAB — LIPID PANEL
Cholesterol: 127 mg/dL (ref <200)
HDL: 61 mg/dL (ref >40)
LDL CALC: 56 mg/dL (ref <100)
TRIGLYCERIDES: 48 mg/dL (ref <150)
Total CHOL/HDL Ratio: 2.1 Ratio (ref <5.0)
VLDL: 10 mg/dL (ref <30)

## 2016-10-16 LAB — HEPATIC FUNCTION PANEL
ALK PHOS: 92 U/L (ref 40–115)
ALT: 14 U/L (ref 9–46)
AST: 24 U/L (ref 10–35)
Albumin: 3.7 g/dL (ref 3.6–5.1)
BILIRUBIN DIRECT: 0.3 mg/dL — AB (ref <=0.2)
BILIRUBIN TOTAL: 1 mg/dL (ref 0.2–1.2)
Indirect Bilirubin: 0.7 mg/dL (ref 0.2–1.2)
Total Protein: 6.3 g/dL (ref 6.1–8.1)

## 2016-10-16 LAB — CBC WITH DIFFERENTIAL/PLATELET
BASOS ABS: 77 {cells}/uL (ref 0–200)
Basophils Relative: 1 %
EOS ABS: 231 {cells}/uL (ref 15–500)
EOS PCT: 3 %
HCT: 40.9 % (ref 38.5–50.0)
Hemoglobin: 13.6 g/dL (ref 13.2–17.1)
LYMPHS PCT: 10 %
Lymphs Abs: 770 cells/uL — ABNORMAL LOW (ref 850–3900)
MCH: 30.7 pg (ref 27.0–33.0)
MCHC: 33.3 g/dL (ref 32.0–36.0)
MCV: 92.3 fL (ref 80.0–100.0)
MONOS PCT: 10 %
MPV: 10.1 fL (ref 7.5–12.5)
Monocytes Absolute: 770 cells/uL (ref 200–950)
NEUTROS ABS: 5852 {cells}/uL (ref 1500–7800)
NEUTROS PCT: 76 %
PLATELETS: 169 10*3/uL (ref 140–400)
RBC: 4.43 MIL/uL (ref 4.20–5.80)
RDW: 13.2 % (ref 11.0–15.0)
WBC: 7.7 10*3/uL (ref 3.8–10.8)

## 2016-10-16 LAB — BASIC METABOLIC PANEL WITH GFR
BUN: 16 mg/dL (ref 7–25)
CHLORIDE: 96 mmol/L — AB (ref 98–110)
CO2: 26 mmol/L (ref 20–31)
Calcium: 9.2 mg/dL (ref 8.6–10.3)
Creat: 0.74 mg/dL (ref 0.70–1.11)
GFR, Est African American: >89 mL/min (ref >=60)
GFR, Est Non African American: 85 mL/min (ref >=60)
Glucose, Bld: 103 mg/dL — ABNORMAL HIGH (ref 65–99)
POTASSIUM: 4.2 mmol/L (ref 3.5–5.3)
SODIUM: 134 mmol/L — AB (ref 135–146)

## 2016-10-16 MED ORDER — METOLAZONE 5 MG PO TABS: ORAL_TABLET | ORAL | 1 refills | Status: DC

## 2016-10-16 NOTE — Progress Notes (Signed)
[name]   Unk Pinto, [name]      Uvaldo Bristle. Silverio Lay, P.A.-C [facility]                [address] [place], [place] [address] Telephone [phone] Telefax [phone] Comprehensive Evaluation & Examination     This very nice 83 y.o. MWM presents for a  comprehensive evaluation and management of multiple medical co-morbidities.  Patient has been followed for HTN, T2_NIDDM  , Hyperlipidemia and Vitamin D Deficiency.  Patient also has moderately severe COPD and Diastolic Heart failure  from Pulm HTN and also OSA/CPAP and is followed by Dr Lamonte Sakai. Patient has also been seen recently by Dr Felecia Shelling fo Peripheral Neuropathy and has ruled out Parkinson's.      HTN predates since 1998. Patient's BP has been controlled at home.  Today's BP is at goal - 132/66.  Patient has pAfib and had stopped Coumadin after a major lower GI bleed associated with diverticulitis. Patient denies any cardiac symptoms as chest pain, palpitations or dizziness, but does have severe shortness of breath and chronic severe dependent edema. He is having increasing difficulty ambulating and is in a wheel chair today.      Patient's hyperlipidemia is controlled with diet and medications. Patient denies myalgias or other medication SE's. Last lipids were at goal  Lab Results  Component Value Date   CHOL 136 06/02/2016   HDL 64 06/02/2016   LDLCALC 56 06/02/2016   TRIG 80 06/02/2016   CHOLHDL 2.1 06/02/2016      Patient has Gluttony / Morbid Obesity (BMI 39+) and consequent T2_NIDDM (Dec 2010) attempting control with diet and patient denies reactive hypoglycemic symptoms, visual blurring, diabetic polys or paresthesias. Last A1c was at goal: Lab Results  Component Value Date   HGBA1C 5.3 06/02/2016       Patient has been on Thyroid replacement since 2007. Finally, patient has history of Vitamin D Deficiency ("18" in 2008) and last  vitamin D was still sl low (goal 70-100): Lab Results  Component Value Date   VD25OH 50 03/03/2016   Current Outpatient Prescriptions on File Prior to Visit  Medication Sig  . TYLENOL #3  Take 1 tab every 4  hrs as needed   . albuterol HFA inhaler 2 puffs every 6  hrs as needed   . PROVENTIL MG/3ML neb soln Take 3 mLs by neb every 4 hours.  Marland Kitchen amoxicillin  500 MG capsule as directed.   Marland Kitchen aspirin 81 MG tablet Take 1 tablet 3 days per week  . ASTELIN nasal spray USE 1 SPRAY IN EACH NOSTRIL TWICE A DAY  . b complex vitamins tablet Take 1 tablet by mouth every morning.   . SYMBICORT 160-4.5  Inhale 2 puffs into the lungs 2 (two) times daily.  . chlorpheniramine  4 MG Take 1 tab in the morning and 1 tab bedtime  . VITAMIN D 2000 UNITS Take 2 capsules  daily.   Marland Kitchen DILT-XR 240 MG 24 hr cap TAKE 1 CAPSULE TWICE A DAY  . COLACE 100 MG Take  2  times daily.    . fish oil-omega-3  1000 MG capsule Take 1 g by mouth 2 (two) times daily.    . furosemide  40 MG tablet Take 40 mg by mouth daily.  Marland Kitchen glucosamine-chondroitin 500-400 MG tablet Take 1 tablet by mouth 1  day or 1 dose.  Marland Kitchen HUMIBID E 400 MG TABS Take 400 mg by mouth 2 (two) times daily.    Marland Kitchen VICODIN 5-325 MG per tablet Only takes during CT/MRI testing   . levothyroxine  100 MCG tablet Take 1 tab daily before breakfast.  . losartan  100 MG tablet Take 100 mg by mouth daily.  Marland Kitchen omeprazole  40 MG capsule Take 2 x daily.  Marland Kitchen SPIRIVA  inhalation cap INHALE1 CAPSULE DAILY   Allergies  Allergen Reactions  . Ace Inhibitors Cough  . Beta Adrenergic Blockers bradycardia  . Decadron [Dexamethasone] unknown  . Fluticasone-Salmeterol Doesn't remember  . Hytrin [Terazosin] Nasal congestion  . Prednisone Shakes / tremors  . Vioxx [Rofecoxib] dyspepsia   Past Medical History:  Diagnosis Date  . Acute bronchitis   . Adenomatous colon polyp   . Allergic rhinitis   . Anemia    iron deficient  . Atrial fibrillation ([place])   . Atrial flutter ([place])   .  Chronic gastritis   . COPD (chronic obstructive pulmonary disease) ([place])   . Diastolic dysfunction   . Diverticulosis of colon    recurrent GI bleeding  . DJD (degenerative joint disease)   . GERD (gastroesophageal reflux disease)   . GI bleed   . HTN (hypertension)   . Hyperplastic colon polyp 2003  . Hypothyroidism   . Moderate aortic insufficiency   . OSA (obstructive sleep apnea)    compliants w/ CPAP  . Positive PPD    remote  . Shortness of breath   . Venous insufficiency    Health Maintenance  Topic Date Due  . PNA vac Low Risk Adult (2 of 2 - PCV13) 12/03/2016 (Originally 05/29/2011)  . INFLUENZA VACCINE  01/21/2017  . TETANUS/TDAP  07/26/2024   Immunization History  Administered Date(s) Administered  . DT 07/26/2014  . DTaP 06/24/2003  . Influenza Split 04/24/2011, 03/23/2012, 03/10/2013  . Influenza Whole 03/23/2009, 03/23/2010  . Influenza, High Dose Seasonal PF 04/13/2014, 03/03/2016  . Influenza,inj,Quad PF,36+ Mos 03/01/2015  . Pneumococcal Polysaccharide-23 05/23/1994, 11/22/2001, 05/28/2010  . Tdap 06/24/2003  . Zoster 09/14/2013   Past Surgical History:  Procedure Laterality Date  . CARDIAC CATHETERIZATION  7.27.05   revealed a preserved EF with no CAD  . COLONOSCOPY    . HERNIA REPAIR Bilateral   . JOINT REPLACEMENT  2001   lt total knee-rt one done 00  . NASAL SINUS SURGERY    . POLYPECTOMY     adenomatous polyps 2010  . SHOULDER ARTHROSCOPY    . TOE SURGERY Right   . TONSILLECTOMY    . TOTAL KNEE ARTHROPLASTY     bilateral   Family History  Problem Relation Age of Onset  . Hypertension Mother   . Diabetes Father   . Lung cancer Brother     chemical   Social History   Social History  . Marital status: Married    Spouse name: N/A  . Number of children: 4  . Years of education: N/A   Occupational History  . part-time as a Theme park manager at [employer]  . Smoking status: Former Smoker     Packs/day: 1.00    Years: 20.00    Types: Cigarettes    Quit date: 08/01/1978  . Smokeless tobacco: Never Used  . Alcohol use 0.0 oz/week     Comment: small glass of wine daily  . Drug use: No  . Sexual activity: Not on  file   Other Topics Concern  . 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    ROS Constitutional: Denies fever, chills, weight loss/gain, headaches, insomnia,  night sweats or change in appetite. Does c/o fatigue. Eyes: Denies redness, blurred vision, diplopia, discharge, itchy or watery eyes.  ENT: Denies discharge, congestion, post nasal drip, epistaxis, sore throat, earache, hearing loss, dental pain, Tinnitus, Vertigo, Sinus pain or snoring.  Cardio: Denies chest pain, palpitations, irregular heartbeat, syncope,  diaphoresis, orthopnea, PND or claudication, but does c/o  Dyspnea and  Edema. Respiratory: denies cough, dyspnea, DOE, pleurisy, hoarseness, laryngitis or wheezing.  Gastrointestinal: Denies dysphagia, heartburn, reflux, water brash, pain, cramps, nausea, vomiting, bloating, diarrhea, constipation, hematemesis, melena, hematochezia, jaundice or hemorrhoids Genitourinary: Denies dysuria, frequency, urgency, nocturia, hesitancy, discharge, hematuria or flank pain Musculoskeletal: Denies arthralgia, myalgia, stiffness, Jt. Swelling, pain, limp or strain/sprain. Denies Falls. Skin: Denies puritis, rash, hives, warts, acne, eczema or change in skin lesion Neuro: No weakness, tremor, incoordination, spasms, paresthesia or pain Psychiatric: Denies confusion, memory loss or sensory loss. Denies Depression. Endocrine: Denies change in weight, skin, hair change, nocturia, and paresthesia, diabetic polys, visual blurring or hyper / hypo glycemic episodes.  Heme/Lymph: No excessive bleeding, bruising or enlarged lymph nodes.  Physical  Exam  BP 132/66   Pulse 68   Temp 97.7 F (36.5 C)   Resp 20   Ht 5\' 7"  (1.702 m)   Wt 249 lb 12.8 oz (113.3 kg)   BMI 39.12 kg/m  O2 sat is 92 % on 2 LPM at rest.   General Appearance: Over nourished and on nasal O2 and in no apparent distress.  Eyes: PERRLA, EOMs, conjunctiva no swelling or erythema, normal fundi and vessels. Sinuses: No frontal/maxillary tenderness ENT/Mouth: EACs patent / TMs  nl. Nares clear without erythema, swelling, mucoid exudates. Oral hygiene is good. No erythema, swelling, or exudate. Tongue normal, non-obstructing. Tonsils not swollen or erythematous. Hearing normal.  Neck: Supple, thyroid normal. No bruits, nodes or JVD. Respiratory: Respiratory effort normal.  BS equal and clear bilateral without rales, rhonci, wheezing or stridor. Cardio: Heart sounds are normal with regular rate and rhythm and no murmurs, rubs or gallops. Peripheral pulses are obscured by 4 + pretibial , ankle & pedal edema.  Chest: symmetric with normal excursions and percussion.  Abdomen: Soft, with Nl bowel sounds. Nontender, no guarding, rebound, hernias, masses. Lymphatics: Non tender without lymphadenopathy.  Musculoskeletal: Generalized decrease in muscle power, tone & bulk. Patient is in a wheelchair today.  Skin: Warm and dry without rashes, lesions, cyanosis, clubbing or  ecchymosis. There is superficial skin breakdown of each shin weeping a clear fluid.  Neuro: Cranial nerves intact, reflexes flat to absent bilaterally.    Sensation decreased in a stocking distribution.   Pysch: Alert and oriented X 3 with normal affect, insight and judgment appropriate.   Assessment and Plan  1. Essential hypertension  - EKG 12-Lead - [place], RETROPERITNL ABD,  LTD - Urinalysis, Routine w reflex microscopic - Microalbumin / creatinine urine ratio - CBC with Differential/Platelet - BASIC METABOLIC PANEL WITH GFR - Magnesium - TSH  2. Mixed hyperlipidemia  - EKG 12-Lead - [place],  RETROPERITNL ABD,  LTD - Hepatic function panel - Lipid panel - TSH  3. Prediabetes  - EKG 12-Lead - [place], RETROPERITNL ABD,  LTD - Hemoglobin A1c - Insulin, random  4. Vitamin D deficiency  - VITAMIN D 25 Hydroxy (Vit-D Deficiency, Fractures)  5. Chronic atrial fibrillation (HCC)   6. Chronic obstructive pulmonary disease, unspecified COPD type ([place])   7. Hypothyroidism, unspecified type  - TSH  8. Screening for ischemic heart disease  - EKG 12-Lead  9. Screening for AAA (aortic abdominal aneurysm)  - [place], RETROPERITNL ABD,  LTD  10. Aortic atherosclerosis (HCC)  - EKG 12-Lead - [place], RETROPERITNL ABD,  LTD  11. Primary Parkinsonism ([place])   12. Class 2 severe obesity due to excess calories with serious comorbidity and body mass index (BMI) of 37.0 to 37.9 in adult ([place])   13. Unstable gait   14. Medication management  - Urinalysis, Routine w reflex microscopic - Microalbumin / creatinine urine ratio - CBC with Differential/Platelet - BASIC METABOLIC PANEL WITH GFR - Hepatic function panel - Magnesium - Lipid panel - TSH - Hemoglobin A1c - Insulin, random - VITAMIN D 25 Hydroxy   15. Screening for colorectal cancer  - POC Hemoccult Bld/Stl   16. Prostate cancer screening  - PSA  17. Chronic diastolic heart failure ([place])   18. Bladder neck obstruction  - PSA  19. Generalized edema  - metolazone (ZAROXOLYN) 5 MG tablet; Take 1 tablet daily as directed for fluid & swelling  Dispense: 30 tablet; Refill: 1  - advised closely monitor daily weight and if > 2# weight loss /24 hr to hold Metolazone that day.   - recommended 2 week f/u with the aggressive diuresis.       Patient was counseled in prudent diet, weight control to achieve/maintain BMI less than 25, BP monitoring, regular exercise and medications as discussed.  Discussed med effects and SE's. Routine screening labs and tests as requested with regular follow-up as recommended. Over 40  minutes of exam, counseling, chart review and high complex critical decision making was performed

## 2016-10-16 NOTE — Patient Instructions (Signed)

## 2016-10-17 ENCOUNTER — Telehealth: Payer: Self-pay | Admitting: *Deleted

## 2016-10-17 ENCOUNTER — Encounter: Payer: Self-pay | Admitting: Internal Medicine

## 2016-10-17 LAB — TSH: TSH: 3.89 m[IU]/L (ref 0.40–4.50)

## 2016-10-17 LAB — MAGNESIUM: Magnesium: 1.8 mg/dL (ref 1.5–2.5)

## 2016-10-17 LAB — PSA: PSA: 1.9 ng/mL (ref <=4.0)

## 2016-10-17 LAB — HEMOGLOBIN A1C
Hgb A1c MFr Bld: 5.2 % (ref <5.7)
MEAN PLASMA GLUCOSE: 103 mg/dL

## 2016-10-17 LAB — VITAMIN D 25 HYDROXY (VIT D DEFICIENCY, FRACTURES): Vit D, 25-Hydroxy: 62 ng/mL (ref 30–100)

## 2016-10-17 LAB — INSULIN, RANDOM: Insulin: 12.1 u[IU]/mL (ref 2.0–19.6)

## 2016-10-17 NOTE — Telephone Encounter (Signed)
Patient called and asked how to care for the sores on his leg.  Per Dr Melford Aase, keep them clean and apply antibiotic ointment and cover with a non-stick pad.  Patient is aware.

## 2016-10-20 ENCOUNTER — Ambulatory Visit: Payer: Medicare Other | Admitting: Emergency Medicine

## 2016-10-22 ENCOUNTER — Encounter: Payer: Self-pay | Admitting: Hematology and Oncology

## 2016-10-22 ENCOUNTER — Telehealth: Payer: Self-pay | Admitting: Hematology and Oncology

## 2016-10-22 NOTE — Telephone Encounter (Signed)
Appt has been scheduled w/Dr. Lindi Adie on 5/22 at 345pm. Pt agreed to the appt date and time. Demographics verified. Letter mailed to the pt.

## 2016-10-30 ENCOUNTER — Ambulatory Visit (INDEPENDENT_AMBULATORY_CARE_PROVIDER_SITE_OTHER): Payer: Medicare Other | Admitting: Internal Medicine

## 2016-10-30 VITALS — BP 126/80 | HR 68 | Temp 97.5°F | Resp 16 | Ht 67.0 in | Wt 234.0 lb

## 2016-10-30 DIAGNOSIS — I482 Chronic atrial fibrillation, unspecified: Secondary | ICD-10-CM

## 2016-10-30 DIAGNOSIS — I5032 Chronic diastolic (congestive) heart failure: Secondary | ICD-10-CM

## 2016-10-30 DIAGNOSIS — I10 Essential (primary) hypertension: Secondary | ICD-10-CM

## 2016-10-30 DIAGNOSIS — Z79899 Other long term (current) drug therapy: Secondary | ICD-10-CM

## 2016-10-30 LAB — CBC WITH DIFFERENTIAL/PLATELET
Basophils Absolute: 83 cells/uL (ref 0–200)
Basophils Relative: 1 %
EOS ABS: 664 {cells}/uL — AB (ref 15–500)
Eosinophils Relative: 8 %
HCT: 40.5 % (ref 38.5–50.0)
Hemoglobin: 13.5 g/dL (ref 13.2–17.1)
LYMPHS PCT: 17 %
Lymphs Abs: 1411 cells/uL (ref 850–3900)
MCH: 30.6 pg (ref 27.0–33.0)
MCHC: 33.3 g/dL (ref 32.0–36.0)
MCV: 91.8 fL (ref 80.0–100.0)
MPV: 10.1 fL (ref 7.5–12.5)
Monocytes Absolute: 996 cells/uL — ABNORMAL HIGH (ref 200–950)
Monocytes Relative: 12 %
Neutro Abs: 5146 cells/uL (ref 1500–7800)
Neutrophils Relative %: 62 %
PLATELETS: 218 10*3/uL (ref 140–400)
RBC: 4.41 MIL/uL (ref 4.20–5.80)
RDW: 13.1 % (ref 11.0–15.0)
WBC: 8.3 10*3/uL (ref 3.8–10.8)

## 2016-10-31 LAB — BASIC METABOLIC PANEL WITH GFR
BUN: 28 mg/dL — ABNORMAL HIGH (ref 7–25)
CALCIUM: 9.2 mg/dL (ref 8.6–10.3)
CHLORIDE: 91 mmol/L — AB (ref 98–110)
CO2: 28 mmol/L (ref 20–31)
CREATININE: 0.95 mg/dL (ref 0.70–1.11)
GFR, EST NON AFRICAN AMERICAN: 74 mL/min (ref >=60)
GFR, Est African American: 85 mL/min (ref >=60)
Glucose, Bld: 101 mg/dL — ABNORMAL HIGH (ref 65–99)
Potassium: 3.3 mmol/L — ABNORMAL LOW (ref 3.5–5.3)
SODIUM: 134 mmol/L — AB (ref 135–146)

## 2016-10-31 LAB — MAGNESIUM: MAGNESIUM: 1.8 mg/dL (ref 1.5–2.5)

## 2016-11-02 ENCOUNTER — Encounter: Payer: Self-pay | Admitting: Internal Medicine

## 2016-11-02 NOTE — Progress Notes (Signed)
Subjective:    Patient ID: Richard Rosales, male    DOB: 1930/12/19, 83 y.o.   MRN: [id number]  HPI  This 83 yo with multiple co-morbidities including HTN, ASHD/cAfib, T2_DM, HLD, severe COPD/PulmHTN, OSA/CPAP returned for 2 week f/u adding Metolazone with his Lasix attempting to prompt a diuresis of his worsening dependent edema consequent of his chronic diastolic Ht Failure compounding severe venous insufficiency worsening to the point of epidermolysis/ skin breakdown. Patient is indifferent to the contribution that his Gluttony & severe Morbid Obesity (BMI 36+) plays in worsening his edema. Likewise his deconditioning compounds his mobility/independence. Nonetheless he did diuresis 15# since 10/14/2016. He denies an recognition of benefit in his performance.   Medication Sig  . TYLENOL #3 300-30 MG Take 1 tab every 4 hrs as needed for moderate pain.  Marland Kitchen albuterol  HFA  inhaler 2 puffs every 6  hrs as needed.  Marland Kitchen albuterol 2.5 MG/3ML neb soln Take 3 mLs by neb every 4  hrs.  Marland Kitchen amoxicillin  500 MG  as directed. (DENTAL)  . aspirin 81 MG  Take 1 tablet 3 days per week  . ASTELIN nasal spray USE 1 SPRAY IN EACH NOSTRIL TWICE A DAY  . B-complex vitamins Take 1 tablet by mouth every morning.   . SYMBICORT 160-4.5 inhaler 2 puffs  2 x daily.  . chlorpheniramine  4 MG  Take 1 tab - morning and 1 tab - bedtime  . VITAMIN D 2000 UNITS Take 2 cap daily.   Marland Kitchen DILT-XR 240 MG 24 hr  TAKE 1 CAP TWICE A DAY  . COLACE 100 MG  Take  2 x daily.    . fish oil-omega-3  1000 MG  Take 1 g by mouth 2 (two) times daily.    . furosemide 40 MG  Take  daily.  Marland Kitchen glucosamine-chondroitin 500-400 MG Take 1 tab 1 day or 1 dose.  Marland Kitchen guaifenesin  400 MG  Take  2 x daily.    Marland Kitchen VICODIN 5-325 MG  Take 1 tab every 6  hrs as needed (Only takes during CT/MRI testing).   Marland Kitchen levothyroxine  100 MCG  Take 1 tab daily before breakfast.  . losartan  100 MG  Take  daily.  . metolazone  5 MG  Take 1 tab daily as directed for fluid & swelling   . Omeprazole 40 MG  Take 2 x daily.  Marland Kitchen SPIRIVA HANDIHALER caps INHALE 1 CAPSULE DAILY   Allergies  Allergen Reactions  . Ace Inhibitors Cough  . Beta Adrenergic Blockers Other (See Comments)    bradycardia  . Decadron [Dexamethasone]     unknown  . Fluticasone-Salmeterol     Doesn't remember  . Hytrin [Terazosin]     Nasal congestion   . Prednisone     Shakes / tremors  . Vioxx [Rofecoxib] Other (See Comments)    dyspepsia     Review of Systems     Objective:   Physical Exam  BP 126/80   P 68   T 97.5 F    R 16   Ht 5\' 7"     Wt 234 lb    BMI 36.65    2 lit Nasal O2 w/ sat of 93% at rest  In no acute distress, altho arising to stand and support on his walker appears to be a monumental effort.  HEENT - WNL. Neck - supple.  Chest - Distant equal BS. Cor - Soft HS. Sl irreg R&R w/o sig MGR.  PP obscured by 3+ pitting edema to the prox shins bilaterally.Compression hose preclude accurate sensory testing. MS- FROM w/o deformities with generalized decrease in muscle power, tone & bulk. Gait is slow, labored and supported by a walker. Neuro -  Nl w/o focal abnormalities.    Assessment & Plan:   1. Essential hypertension  - CBC with Differential/Platelet - BASIC METABOLIC PANEL WITH GFR - Magnesium  2. Chronic atrial fibrillation (HCC)   3. Chronic diastolic heart failure ([place])   4. Medication management  - CBC with Differential/Platelet - BASIC METABOLIC PANEL WITH GFR - Magnesium  - Have recommended that he continue his Metolazone daily and increase his lasix to bid, monitoring his daily weights/fluid balance and instructed to hold the Metolazone if loses over 2 #/day. Prognosis is guarded given his lack of motivation. Recommend ROV 2 weeks to closely monitor Renal/electrolytes.  Discussed reading Dr Lear Ng books: "the End of Dieting" & "The End of Diabetes" as well as low impact conditioning exercises.

## 2016-11-11 ENCOUNTER — Ambulatory Visit (HOSPITAL_BASED_OUTPATIENT_CLINIC_OR_DEPARTMENT_OTHER): Payer: Medicare Other | Admitting: Hematology and Oncology

## 2016-11-11 DIAGNOSIS — Z87891 Personal history of nicotine dependence: Secondary | ICD-10-CM | POA: Diagnosis not present

## 2016-11-11 DIAGNOSIS — J449 Chronic obstructive pulmonary disease, unspecified: Secondary | ICD-10-CM | POA: Diagnosis not present

## 2016-11-11 DIAGNOSIS — Z9981 Dependence on supplemental oxygen: Secondary | ICD-10-CM | POA: Diagnosis not present

## 2016-11-11 DIAGNOSIS — D472 Monoclonal gammopathy: Secondary | ICD-10-CM | POA: Insufficient documentation

## 2016-11-11 DIAGNOSIS — I509 Heart failure, unspecified: Secondary | ICD-10-CM

## 2016-11-11 DIAGNOSIS — Z801 Family history of malignant neoplasm of trachea, bronchus and lung: Secondary | ICD-10-CM | POA: Diagnosis not present

## 2016-11-11 NOTE — Progress Notes (Signed)
[place] NOTE  Patient Care Team: Unk Pinto, MD as PCP - General (Internal Medicine) Lamonte Sakai Rose Fillers, MD as Consulting Physician (Pulmonary Disease) Inda Castle, MD as Consulting Physician (Gastroenterology) Thompson Grayer, MD as Consulting Physician (Cardiology)  CHIEF COMPLAINTS/PURPOSE OF CONSULTATION:  IgG lambda monoclonal protein  HISTORY OF PRESENTING ILLNESS:  Richard Rosales 83 y.o. male is here because he was noted to have IgG lambda monoclonal protein on serum protein electrophoresis/immunofixation. Patient has a very complex medical history that involves congestive heart failure, COPD on oxygen, wheelchair-bound because of multiple health issues was noted on blood work to have normal serum protein electrophoresis but on immunofixation he was noted to have a small amount of IgG lambda monoclonal protein. He does not have any anemia on kidney disease are hypercalcemia.He is here today in my office accompanied by his friend. Recently he has had profound lower extremity edema for which she went on aggressive diuresis and apparently lost up to 15 pounds. Overall he has lost 30-40 pounds. He does feel that after he takes Lasix he has to be in the bathroom for couple of hours until the frequent urination stops.  I reviewed her records extensively and collaborated the history with the patient.  MEDICAL HISTORY:  Past Medical History:  Diagnosis Date  . Acute bronchitis   . Adenomatous colon polyp   . Allergic rhinitis   . Anemia    iron deficient  . Atrial fibrillation ([place])   . Atrial flutter ([place])   . Chronic gastritis   . COPD (chronic obstructive pulmonary disease) ([place])   . Diastolic dysfunction   . Diverticulosis of colon    recurrent GI bleeding  . DJD (degenerative joint disease)   . GERD (gastroesophageal reflux disease)   . GI bleed   . HTN (hypertension)   . Hyperplastic colon polyp 2003  . Hypothyroidism   . Moderate aortic  insufficiency   . OSA (obstructive sleep apnea)    compliants w/ CPAP  . Positive PPD    remote  . Shortness of breath   . Venous insufficiency     SURGICAL HISTORY: Past Surgical History:  Procedure Laterality Date  . CARDIAC CATHETERIZATION  7.27.05   revealed a preserved EF with no CAD  . COLONOSCOPY    . HERNIA REPAIR Bilateral   . JOINT REPLACEMENT  2001   lt total knee-rt one done 00  . NASAL SINUS SURGERY    . POLYPECTOMY     adenomatous polyps 2010  . SHOULDER ARTHROSCOPY    . TOE SURGERY Right   . TONSILLECTOMY    . TOTAL KNEE ARTHROPLASTY     bilateral    SOCIAL HISTORY: Social History   Social History  . Marital status: Married    Spouse name: N/A  . Number of children: 4  . Years of education: N/A   Occupational History  . part-time as a Theme park manager at [employer]  . Smoking status: Former Smoker    Packs/day: 1.00    Years: 20.00    Types: Cigarettes    Quit date: 08/01/1978  . Smokeless tobacco: Never Used  . Alcohol use 0.0 oz/week     Comment: small glass of wine daily  . Drug use: No  . Sexual activity: Not on file   Other Topics Concern  . 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    FAMILY HISTORY: Family History  Problem Relation Age of Onset  . Hypertension Mother   . Diabetes Father   . Lung cancer Brother        chemical    ALLERGIES:  is allergic to ace inhibitors; beta adrenergic blockers; decadron [dexamethasone]; fluticasone-salmeterol; hytrin [terazosin]; prednisone; and vioxx [rofecoxib].  MEDICATIONS:  Current Outpatient Prescriptions  Medication Sig Dispense Refill  . acetaminophen-codeine (TYLENOL #3) 300-30 MG tablet Take 1 tablet by mouth every 4 (four) hours as needed for moderate pain. 180 tablet 3  . albuterol (PROVENTIL HFA;VENTOLIN HFA)  108 (90 Base) MCG/ACT inhaler Inhale 2 puffs into the lungs every 6 (six) hours as needed for wheezing or shortness of breath. 1 Inhaler 2  . albuterol (PROVENTIL) (2.5 MG/3ML) 0.083% nebulizer solution Take 3 mLs (2.5 mg total) by nebulization every 4 (four) hours. 300 mL 11  . amoxicillin (AMOXIL) 500 MG capsule as directed. (DENTAL)    . aspirin 81 MG tablet Take 1 tablet 3 days per week    . azelastine (ASTELIN) 0.1 % nasal spray USE 1 SPRAY IN EACH NOSTRIL TWICE A DAY 90 mL 1  . b complex vitamins tablet Take 1 tablet by mouth every morning.     . budesonide-formoterol (SYMBICORT) 160-4.5 MCG/ACT inhaler Inhale 2 puffs into the lungs 2 (two) times daily. 3 Inhaler 3  . chlorpheniramine (CHLOR-TRIMETON) 4 MG tablet Take 1 tablet by mouth in the morning and 1 tablet at bedtime    . Cholecalciferol (VITAMIN D) 2000 UNITS CAPS Take 2 capsules by mouth daily.     Marland Kitchen DILT-XR 240 MG 24 hr capsule TAKE 1 CAPSULE TWICE A DAY 180 capsule 3  . docusate sodium (COLACE) 100 MG capsule Take 100 mg by mouth 2 (two) times daily.      . fish oil-omega-3 fatty acids 1000 MG capsule Take 1 g by mouth 2 (two) times daily.      . furosemide (LASIX) 40 MG tablet Take 40 mg by mouth daily.    Marland Kitchen glucosamine-chondroitin 500-400 MG tablet Take 1 tablet by mouth 1 day or 1 dose.    Marland Kitchen guaifenesin (HUMIBID E) 400 MG TABS Take 400 mg by mouth 2 (two) times daily.      Marland Kitchen HYDROcodone-acetaminophen (NORCO/VICODIN) 5-325 MG per tablet Take 1 tablet by mouth every 6 (six) hours as needed for moderate pain (Only takes during CT/MRI testing).     Marland Kitchen levothyroxine (SYNTHROID, LEVOTHROID) 100 MCG tablet Take 1 tablet (100 mcg total) by mouth daily before breakfast. 90 tablet 4  . losartan (COZAAR) 100 MG tablet Take 100 mg by mouth daily.    . metolazone (ZAROXOLYN) 5 MG tablet Take 1 tablet daily as directed for fluid & swelling 30 tablet 1  . omeprazole (PRILOSEC) 40 MG capsule Take 40 mg by mouth 2 (two) times daily.    Marland Kitchen  SPIRIVA HANDIHALER 18 MCG inhalation capsule INHALE THE CONTENTS OF 1 CAPSULE DAILY 90 capsule 1   No current facility-administered medications for this visit.     REVIEW OF SYSTEMS:   Constitutional: Denies fevers, chills or abnormal night sweats Eyes: Denies blurriness of vision, double vision or watery eyes Ears, nose, mouth, throat, and face: Denies mucositis or sore throat Respiratory: shortness of breath at rest Cardiovascular: Denies palpitation, chest discomfort or lower extremity swelling Gastrointestinal:  Denies nausea, heartburn or change in bowel habits Skin: Denies abnormal  skin rashes Lymphatics: Denies new lymphadenopathy or easy bruising Neurological:numbness in extremities, tremors Behavioral/Psych: Mood is stable, no new changes   All other systems were reviewed with the patient and are negative.  PHYSICAL EXAMINATION: ECOG PERFORMANCE STATUS: 3 - Symptomatic, >50% confined to bed  Vitals:   11/11/16 1559  BP: (!) 123/58  Pulse: (!) 58  Resp: 18  Temp: 98.5 F (36.9 C)   Filed Weights    GENERAL:alert, no distress and comfortable SKIN: skin color, texture, turgor are normal, no rashes or significant lesions EYES: normal, conjunctiva are pink and non-injected, sclera clear OROPHARYNX:no exudate, no erythema and lips, buccal mucosa, and tongue normal  NECK: supple, thyroid normal size, non-tender, without nodularity LYMPH:  no palpable lymphadenopathy in the cervical, axillary or inguinal LUNGS: diminished breath sounds bilaterally HEART: irregular, atrial fibrillation ABDOMEN:abdomen soft, non-tender and normal bowel sounds Musculoskeletal:no cyanosis of digits and no clubbing  PSYCH: alert & oriented x 3 with fluent speech NEURO: no focal motor/sensory deficits Extremities: Lower extremity edema 3+ but much improved from before  LABORATORY DATA:  I have reviewed the data as listed Lab Results  Component Value Date   WBC 8.3 10/30/2016   HGB 13.5  10/30/2016   HCT 40.5 10/30/2016   MCV 91.8 10/30/2016   PLT 218 10/30/2016   Lab Results  Component Value Date   NA 134 (L) 10/30/2016   K 3.3 (L) 10/30/2016   CL 91 (L) 10/30/2016   CO2 28 10/30/2016    RADIOGRAPHIC STUDIES: I have personally reviewed the radiological reports and agreed with the findings in the report.  ASSESSMENT AND PLAN:  MGUS (monoclonal gammopathy of unknown significance) No quantifiable increase in M protein but immunofixation revealed IgG monoclonal protein with lambda light chain specificity. This is suggestive of MGUS. I discussed with the patient the entire spectrum of disorders all monoclonal gammopathies ranging from MGUS to multiple myeloma. The risk of transformation from MGUS to myeloma is 1-2% per year. He does not have any evidence of hypercalcemia, or anemia or renal dysfunction. I do not think there is any need for bone survey are of bone marrow biopsy.  Recommendation: I do not recommend any treatment for his blood problem. I recommended that he get serum protein electrophoresis done annually along with immunofixation. If his monoclonal protein increases to 1 g or higher, or he develops organ dysfunction like renal insufficiency, hypercalcemia, profound anemia with hemoglobin less than 10, we will need to see him back for further evaluation and bone marrow biopsy for necessary.  Please send the patient back to [place] if you feel that his clinical condition suggests worsening monoclonal gammopathy. We are happy to see him on an as-needed basis.  All questions were answered. The patient knows to call the clinic with any problems, questions or concerns.    Rulon Eisenmenger, MD 11/11/16

## 2016-11-11 NOTE — Assessment & Plan Note (Signed)
No quantifiable increase in M protein but immunofixation revealed IgG monoclonal protein with lambda light chain specificity. This is suggestive of MGUS.  I discussed with the patient the entire spectrum of disorders all monoclonal gammopathies ranging from MGUS to multiple myeloma.  Richard Rosales does not have any evidence of hypercalcemia, or anemia or renal dysfunction. I do not think there is any need for bone survey are of bone marrow biopsy.  Richard Rosales will be followed annually with serum protein electrophoresis along with Kappa lambda ratio

## 2016-11-15 ENCOUNTER — Other Ambulatory Visit: Payer: Self-pay | Admitting: Internal Medicine

## 2016-11-18 ENCOUNTER — Ambulatory Visit: Payer: Self-pay | Admitting: Internal Medicine

## 2016-11-19 ENCOUNTER — Other Ambulatory Visit: Payer: Self-pay | Admitting: Emergency Medicine

## 2016-11-25 ENCOUNTER — Encounter: Payer: Self-pay | Admitting: Internal Medicine

## 2016-11-25 ENCOUNTER — Ambulatory Visit (INDEPENDENT_AMBULATORY_CARE_PROVIDER_SITE_OTHER): Payer: Medicare Other | Admitting: Internal Medicine

## 2016-11-25 VITALS — BP 116/64 | HR 60 | Temp 97.5°F | Resp 18 | Ht 67.0 in | Wt 233.0 lb

## 2016-11-25 DIAGNOSIS — R601 Generalized edema: Secondary | ICD-10-CM | POA: Diagnosis not present

## 2016-11-25 DIAGNOSIS — I10 Essential (primary) hypertension: Secondary | ICD-10-CM

## 2016-11-25 DIAGNOSIS — Z79899 Other long term (current) drug therapy: Secondary | ICD-10-CM | POA: Diagnosis not present

## 2016-11-25 DIAGNOSIS — I5032 Chronic diastolic (congestive) heart failure: Secondary | ICD-10-CM | POA: Diagnosis not present

## 2016-11-25 LAB — CBC WITH DIFFERENTIAL/PLATELET
BASOS ABS: 76 {cells}/uL (ref 0–200)
Basophils Relative: 1 %
EOS ABS: 532 {cells}/uL — AB (ref 15–500)
Eosinophils Relative: 7 %
HEMATOCRIT: 41 % (ref 38.5–50.0)
HEMOGLOBIN: 13.5 g/dL (ref 13.2–17.1)
LYMPHS ABS: 1216 {cells}/uL (ref 850–3900)
Lymphocytes Relative: 16 %
MCH: 30.5 pg (ref 27.0–33.0)
MCHC: 32.9 g/dL (ref 32.0–36.0)
MCV: 92.8 fL (ref 80.0–100.0)
MPV: 10.5 fL (ref 7.5–12.5)
Monocytes Absolute: 836 cells/uL (ref 200–950)
Monocytes Relative: 11 %
NEUTROS ABS: 4940 {cells}/uL (ref 1500–7800)
NEUTROS PCT: 65 %
Platelets: 187 10*3/uL (ref 140–400)
RBC: 4.42 MIL/uL (ref 4.20–5.80)
RDW: 13.1 % (ref 11.0–15.0)
WBC: 7.6 10*3/uL (ref 3.8–10.8)

## 2016-11-25 NOTE — Progress Notes (Signed)
Subjective:    Patient ID: Richard Rosales, male    DOB: 26-Jul-1930, 83 y.o.   MRN: [id number]  HPI    Patient is an 83 yo MWM with HTN, ASHD/cAfib, T2_DM, HLD, severe COPD/PulmHTN, OSA/CPAP returning for 2sd 2 week f/u of aggressive diuresis of his refractory chronic edema felt multifactorial from chronic diastolic heart failure and venous Insufficiency compounded by his severe edema. His weight is not dow significantly altho he feels thaty his dependent edema & the calf/ankle girth is much improved & smaller.   Medication Sig  . TYLENOL #3  Take 1 tablet by mouth every 4 (four) hours as needed   . albuterol HFA inhaler Inhale 2 puffs into the lungs every 6 (six) hours as needed   . Albuterol 2.5 MG neb soln Take 3 mLs (2.5 mg total) by nebulization every 4 (four) hours.  Marland Kitchen amoxicillin ) 500 MG capsule as directed. (DENTAL)  . aspirin 81 MG tablet Take 1 tablet 3 days per week  . ASTELIN nasal spray USE 1 SPRAY IN EACH NOSTRIL TWICE A DAY  . b complex vitamins tablet Take 1 tablet by mouth every morning.   . chlorpheniramine  4 MG Take 1 tablet by mouth in the morning and 1 tablet at bedtime  . VITAMIN D 2000 UNITS Take 2 capsules by mouth daily.   Marland Kitchen DILT-XR 240 MG 24 hr  TAKE 1 CAPSULE TWICE A DAY  . COLACE 100 MG  Take 100 mg by mouth 2 (two) times daily.    . fish oil-omega-3  1000 MG  Take 1 g by mouth 2 (two) times daily.    . furosemide  40 MG tablet Take 40 mg  2 x daily  . glucosamine-chondroitin  Take 1 tablet by mouth 1 day or 1 dose.  Marland Kitchen guaifenesin  400 MG TABS Take 400 mg by mouth 2 (two) times daily.    Marland Kitchen losartan  100 MG tablet TAKE 1 TABLET DAILY  . metolazone  5 MG tablet Take 1 tablet daily as directed for fluid & swelling  . omeprazole ( 40 MG capsule Take 40 mg by mouth 2 (two) times daily.  Marland Kitchen SPIRIVA  inhalation capsule INHALE THE CONTENTS OF 1 CAPSULE DAILY  . SYMBICORT 160-4.5 MCG USE 2 INHALATIONS TWICE A DAY   Allergies  Allergen Reactions  . Ace Inhibitors  Cough  . Beta Adrenergic Blockers Other (See Comments)    bradycardia  . Decadron [Dexamethasone]     unknown  . Fluticasone-Salmeterol     Doesn't remember  . Hytrin [Terazosin]     Nasal congestion   . Prednisone     Shakes / tremors  . Vioxx [Rofecoxib] Other (See Comments)    dyspepsia   Past Medical History:  Diagnosis Date  . Acute bronchitis   . Adenomatous colon polyp   . Allergic rhinitis   . Anemia    iron deficient  . Atrial fibrillation ([place])   . Atrial flutter ([place])   . Chronic gastritis   . COPD (chronic obstructive pulmonary disease) ([place])   . Diastolic dysfunction   . Diverticulosis of colon    recurrent GI bleeding  . DJD (degenerative joint disease)   . GERD (gastroesophageal reflux disease)   . GI bleed   . HTN (hypertension)   . Hyperplastic colon polyp 2003  . Hypothyroidism   . Moderate aortic insufficiency   . OSA (obstructive sleep apnea)    compliants w/ CPAP  . Positive PPD  remote  . Shortness of breath   . Venous insufficiency    Past Surgical History:  Procedure Laterality Date  . CARDIAC CATHETERIZATION  7.27.05   revealed a preserved EF with no CAD  . COLONOSCOPY    . HERNIA REPAIR Bilateral   . JOINT REPLACEMENT  2001   lt total knee-rt one done 00  . NASAL SINUS SURGERY    . POLYPECTOMY     adenomatous polyps 2010  . SHOULDER ARTHROSCOPY    . TOE SURGERY Right   . TONSILLECTOMY    . TOTAL KNEE ARTHROPLASTY     bilateral   Review of Systems  10 point systems review negative except as above.    Objective:   Physical Exam  BP 116/64   Pulse 60   Temp 97.5 F (36.4 C)   Resp 18   Ht 5\' 7"  (1.702 m)   Wt 233 lb (105.7 kg)   BMI 36.49 kg/m    O2 sat 94-95% on 2 lit/min nasal  HEENT - WNL. Neck - supple. No JVD evident. Chest - BS decreased, but clear.  Cor - HS soft. RRR w/o sig MGR. PP still obscures by brawny anasarca.  MS- Gen decrease in muscle power, tone & bulk.  In Wheelchair. Neuro -  Nl w/o focal  abnormalities.    Assessment & Plan:   1. Essential hypertension  - CBC with Differential/Platelet - BASIC METABOLIC PANEL WITH GFR - Magnesium  2. Chronic diastolic heart failure ([place])  3. Generalized edema  4. Medication management  - CBC with Differential/Platelet - BASIC METABOLIC PANEL WITH GFR - Magnesium  - ROV 1 month for close monitoring of renal functions & electrolytes.

## 2016-11-26 ENCOUNTER — Encounter: Payer: Self-pay | Admitting: *Deleted

## 2016-11-26 LAB — BASIC METABOLIC PANEL WITH GFR
BUN: 30 mg/dL — ABNORMAL HIGH (ref 7–25)
CALCIUM: 9.6 mg/dL (ref 8.6–10.3)
CHLORIDE: 91 mmol/L — AB (ref 98–110)
CO2: 34 mmol/L — AB (ref 20–31)
CREATININE: 0.96 mg/dL (ref 0.70–1.11)
GFR, Est African American: 84 mL/min (ref >=60)
GFR, Est Non African American: 72 mL/min (ref >=60)
GLUCOSE: 103 mg/dL — AB (ref 65–99)
Potassium: 4 mmol/L (ref 3.5–5.3)
Sodium: 137 mmol/L (ref 135–146)

## 2016-11-26 LAB — MAGNESIUM: Magnesium: 2.3 mg/dL (ref 1.5–2.5)

## 2016-11-30 ENCOUNTER — Other Ambulatory Visit: Payer: Self-pay | Admitting: Physician Assistant

## 2016-12-01 ENCOUNTER — Ambulatory Visit: Payer: Self-pay | Admitting: Internal Medicine

## 2016-12-04 ENCOUNTER — Encounter: Payer: Self-pay | Admitting: Neurology

## 2016-12-04 ENCOUNTER — Ambulatory Visit (INDEPENDENT_AMBULATORY_CARE_PROVIDER_SITE_OTHER): Payer: Medicare Other | Admitting: Neurology

## 2016-12-04 DIAGNOSIS — G250 Essential tremor: Secondary | ICD-10-CM

## 2016-12-04 DIAGNOSIS — R269 Unspecified abnormalities of gait and mobility: Secondary | ICD-10-CM

## 2016-12-04 DIAGNOSIS — D472 Monoclonal gammopathy: Secondary | ICD-10-CM | POA: Diagnosis not present

## 2016-12-04 DIAGNOSIS — R200 Anesthesia of skin: Secondary | ICD-10-CM

## 2016-12-04 DIAGNOSIS — G63 Polyneuropathy in diseases classified elsewhere: Secondary | ICD-10-CM | POA: Diagnosis not present

## 2016-12-04 DIAGNOSIS — R202 Paresthesia of skin: Secondary | ICD-10-CM | POA: Diagnosis not present

## 2016-12-04 NOTE — Progress Notes (Signed)
[facility]  PATIENT: Richard Rosales DOB: 1930/12/16  REFERRING DOCTOR OR PCP:  Unk Pinto SOURCE: patient, notes from Dr. Melford Aase,   _________________________________   HISTORICAL  CHIEF COMPLAINT:  Chief Complaint  Patient presents with  . Numbness    HISTORY OF PRESENT ILLNESS:  Richard Rosales is a 83 yo man with poor gait and polyneuropathy.    After his initial visit, I was concerned about polyneuropathy and a nerve conduction and EMG study was performed showing a severe length dependent polyneuropathy. Blood work was obtained and he had a paraprotein (IgG lambda). 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 He was referred to hematology.  Because the amount of monoclonal paraprotein was low and observation with periodic SPEP was recommended.  Currently, he feels that his numbness and gait dysfunction is doing about the same. Walking independently to needing a walker in 2017. Balance worsened and legs felt weaker.  He has had some falls but does not feel he is any worse this month than he was a couple months ago.   The numbness is only in the feet and the ankles. He does not note any numbness in the hands.  Additionally, he has a tremor in his hands. He saw her Dr. Guillermina [place] and Parkinson's was diagnosed but he did not have any benefit with the Sinemet. He felt his tremor was more consistent with a mild benign essential tremor. Due to his cardiac issues and COPD, he would not be a good candidate for beta blocker. He has daytime sleepiness and I would be concerned about the other medications for essential tremor.   Regardless, he feels that the tremor is stable compared to his last visit.,    Data:   CT scan of the head from 2015 shows mild atrophy and advanced chronic microvascular ischemic change with a lacunar infarction in the anterior internal capsule on the left. An MRI of the cervical spine 08/05/2003 reportedly showed multilevel  degenerative spondylosis with a dense degenerative disc disease at C4-C5, C5-C6 and C6-C7 and left foraminal narrowing at C4-C5, bilateral foraminal narrowing at C5-C6 and left foraminal narrowing at C6-C7. Disc protrusion and bone spurs were encroaching on the left C7 nerve root. NCV/EMG for 05/12/2017 shows a severe sensorimotor length dependent polyneuropathy with both axonal and demyelinating features. There was ongoing denervation in the gastrocnemius muscle.  REVIEW OF SYSTEMS: Constitutional: No fevers, chills, sweats, or change in appetite.   Some fatigue Eyes: No visual changes, double vision, eye pain Ear, nose and throat: No hearing loss, ear pain, nasal congestion, sore throat Cardiovascular: He has chronic A. fib. He has edema. Respiratory: No shortness of breath at rest or with exertion.  He has OSA and COPD. GastrointestinaI: No nausea, vomiting, diarrhea, abdominal pain, fecal incontinence.  He has GERD. Genitourinary: No dysuria, urinary retention or frequency.  No nocturia. Musculoskeletal: No neck pain, back pain Integumentary: No rash, pruritus, skin lesions Neurological: as above Psychiatric: No depression at this time.  No anxiety Endocrine: No palpitations, diaphoresis, change in appetite, change in weigh or increased thirst Hematologic/Lymphatic: No anemia, purpura, petechiae. Allergic/Immunologic: No itchy/runny eyes, nasal congestion, recent allergic reactions, rashes  ALLERGIES: Allergies  Allergen Reactions  . Ace Inhibitors Cough  . Beta Adrenergic Blockers Other (See Comments)    bradycardia  . Decadron [Dexamethasone]     unknown  . Fluticasone-Salmeterol     Doesn't remember  . Hytrin [Terazosin]     Nasal congestion   . Prednisone  Shakes / tremors  . Vioxx [Rofecoxib] Other (See Comments)    dyspepsia    HOME MEDICATIONS:  Current Outpatient Prescriptions:  .  acetaminophen-codeine (TYLENOL #3) 300-30 MG tablet, Take 1 tablet by mouth  every 4 (four) hours as needed for moderate pain., Disp: 180 tablet, Rfl: 3 .  albuterol (PROVENTIL HFA;VENTOLIN HFA) 108 (90 Base) MCG/ACT inhaler, Inhale 2 puffs into the lungs every 6 (six) hours as needed for wheezing or shortness of breath., Disp: 1 Inhaler, Rfl: 2 .  albuterol (PROVENTIL) (2.5 MG/3ML) 0.083% nebulizer solution, Take 3 mLs (2.5 mg total) by nebulization every 4 (four) hours., Disp: 300 mL, Rfl: 11 .  amoxicillin (AMOXIL) 500 MG capsule, as directed. (DENTAL), Disp: , Rfl:  .  aspirin 81 MG tablet, Take 1 tablet 3 days per week, Disp: , Rfl:  .  azelastine (ASTELIN) 0.1 % nasal spray, USE 1 SPRAY IN EACH NOSTRIL TWICE A DAY, Disp: 90 mL, Rfl: 1 .  b complex vitamins tablet, Take 1 tablet by mouth every morning. , Disp: , Rfl:  .  chlorpheniramine (CHLOR-TRIMETON) 4 MG tablet, Take 1 tablet by mouth in the morning and 1 tablet at bedtime, Disp: , Rfl:  .  Cholecalciferol (VITAMIN D) 2000 UNITS CAPS, Take 2 capsules by mouth daily. , Disp: , Rfl:  .  DILT-XR 240 MG 24 hr capsule, TAKE 1 CAPSULE TWICE A DAY, Disp: 180 capsule, Rfl: 3 .  docusate sodium (COLACE) 100 MG capsule, Take 100 mg by mouth 2 (two) times daily.  , Disp: , Rfl:  .  fish oil-omega-3 fatty acids 1000 MG capsule, Take 1 g by mouth 2 (two) times daily.  , Disp: , Rfl:  .  furosemide (LASIX) 40 MG tablet, Take 40 mg by mouth daily., Disp: , Rfl:  .  glucosamine-chondroitin 500-400 MG tablet, Take 1 tablet by mouth 1 day or 1 dose., Disp: , Rfl:  .  guaifenesin (HUMIBID E) 400 MG TABS, Take 400 mg by mouth 2 (two) times daily.  , Disp: , Rfl:  .  HYDROcodone-acetaminophen (NORCO/VICODIN) 5-325 MG per tablet, Take 1 tablet by mouth every 6 (six) hours as needed for moderate pain (Only takes during CT/MRI testing). , Disp: , Rfl:  .  levothyroxine (SYNTHROID, LEVOTHROID) 100 MCG tablet, Take 1 tablet (100 mcg total) by mouth daily before breakfast., Disp: 90 tablet, Rfl: 4 .  losartan (COZAAR) 100 MG tablet, TAKE 1  TABLET DAILY, Disp: 90 tablet, Rfl: 1 .  metolazone (ZAROXOLYN) 5 MG tablet, Take 1 tablet daily as directed for fluid & swelling, Disp: 30 tablet, Rfl: 1 .  omeprazole (PRILOSEC) 40 MG capsule, TAKE 1 CAPSULE TWICE A DAY, Disp: 180 capsule, Rfl: 1 .  SPIRIVA HANDIHALER 18 MCG inhalation capsule, INHALE THE CONTENTS OF 1 CAPSULE DAILY, Disp: 90 capsule, Rfl: 1 .  SYMBICORT 160-4.5 MCG/ACT inhaler, USE 2 INHALATIONS TWICE A DAY, Disp: 30.6 g, Rfl: 3  PAST MEDICAL HISTORY: Past Medical History:  Diagnosis Date  . Acute bronchitis   . Adenomatous colon polyp   . Allergic rhinitis   . Anemia    iron deficient  . Atrial fibrillation ([place])   . Atrial flutter ([place])   . Chronic gastritis   . COPD (chronic obstructive pulmonary disease) ([place])   . Diastolic dysfunction   . Diverticulosis of colon    recurrent GI bleeding  . DJD (degenerative joint disease)   . GERD (gastroesophageal reflux disease)   . GI bleed   . HTN (  hypertension)   . Hyperplastic colon polyp 2003  . Hypothyroidism   . Moderate aortic insufficiency   . OSA (obstructive sleep apnea)    compliants w/ CPAP  . Positive PPD    remote  . Shortness of breath   . Venous insufficiency     PAST SURGICAL HISTORY: Past Surgical History:  Procedure Laterality Date  . CARDIAC CATHETERIZATION  7.27.05   revealed a preserved EF with no CAD  . COLONOSCOPY    . HERNIA REPAIR Bilateral   . JOINT REPLACEMENT  2001   lt total knee-rt one done 00  . NASAL SINUS SURGERY    . POLYPECTOMY     adenomatous polyps 2010  . SHOULDER ARTHROSCOPY    . TOE SURGERY Right   . TONSILLECTOMY    . TOTAL KNEE ARTHROPLASTY     bilateral    FAMILY HISTORY: Family History  Problem Relation Age of Onset  . Hypertension Mother   . Diabetes Father   . Lung cancer Brother        chemical    SOCIAL HISTORY:  Social History   Social History  . Marital status: Married    Spouse name: N/A  . Number of children: 4  . Years of  education: N/A   Occupational History  . part-time as a Theme park manager at [employer]  . Smoking status: Former Smoker    Packs/day: 1.00    Years: 20.00    Types: Cigarettes    Quit date: 08/01/1978  . Smokeless tobacco: Never Used  . Alcohol use 0.0 oz/week     Comment: small glass of wine daily  . Drug use: No  . Sexual activity: Not on file   Other Topics Concern  . 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     PHYSICAL EXAM  There were no vitals filed for this visit.  There is no height or weight on file to calculate BMI.   General: The patient is well-developed and well-nourished and in no acute distress  Neck: The neck is supple, no definite carotid bruits noted - cardiac murmur radiates.  The neck is mildly tender with reduced range of motion..   Skin: Extremities are without rash That there are skin changes related to his severe pedal edema.  Musculoskeletal:  Back is nontender  Neurologic Exam  Mental status: The patient is alert and oriented x 3 at the time of the examination. The patient has apparent normal recent and remote memory, with an apparently normal attention span and concentration ability.   Speech is normal.  Cranial nerves: Extraocular movements are full. Facial strength and sensation is normal. Trapezius and sternocleidomastoid strength is normal. No dysarthria is noted.  The tongue is midline, and the patient has symmetric elevation of the soft palate. No obvious hearing deficits are noted.  Motor:  Muscle bulk is normal.   Tone is normal. Strength is  4+/5 bilateral triceps and left biceps,  4+/5 intrinsic hand muscles on the right (median and ulnar) and 4/5 in intrinsic foot muscles  Sensory: Vibration sensation is absent at the toes and reduced in the ankles.   Vibration  sensation at the knees is similar to the hands per  Coordination: Cerebellar testing reveals good finger-nose-finger and reduced heel-to-shin bilaterally.  Gait and station: Station is  normal.  He uses arms to stand up.  The gait is reduced in stride.Marland Kitchen  He cannot tandem walk..  Tandem gait is normal. Romberg is positive.   Reflexes: Deep tendon reflexes are symmetric and normal in arms.  Knee reflexes are  1-2.  Marland Kitchen Ankle reflexes are absent.       DIAGNOSTIC DATA (LABS, IMAGING, TESTING) - I reviewed patient records, labs, notes, testing and imaging myself where available.  Lab Results  Component Value Date   WBC 7.6 11/25/2016   HGB 13.5 11/25/2016   HCT 41.0 11/25/2016   MCV 92.8 11/25/2016   PLT 187 11/25/2016      Component Value Date/Time   NA 137 11/25/2016 1532   K 4.0 11/25/2016 1532   CL 91 (L) 11/25/2016 1532   CO2 34 (H) 11/25/2016 1532   GLUCOSE 103 (H) 11/25/2016 1532   BUN 30 (H) 11/25/2016 1532   CREATININE 0.96 11/25/2016 1532   CALCIUM 9.6 11/25/2016 1532   PROT 6.3 10/16/2016 1644   PROT 6.7 10/01/2016 1528   ALBUMIN 3.7 10/16/2016 1644   AST 24 10/16/2016 1644   ALT 14 10/16/2016 1644   ALKPHOS 92 10/16/2016 1644   BILITOT 1.0 10/16/2016 1644   GFRNONAA 72 11/25/2016 1532   GFRAA 84 11/25/2016 1532   Lab Results  Component Value Date   CHOL 127 10/16/2016   HDL 61 10/16/2016   LDLCALC 56 10/16/2016   TRIG 48 10/16/2016   CHOLHDL 2.1 10/16/2016   Lab Results  Component Value Date   HGBA1C 5.2 10/16/2016   Lab Results  Component Value Date   GGEZMOQH47 654 02/05/2015   Lab Results  Component Value Date   TSH 3.89 10/16/2016       ASSESSMENT AND PLAN  Neuropathy associated with MGUS (HCC)  Gait disturbance  Numbness and tingling of both legs  Essential tremor   1.    His numbness and gait disturbance is likely related to p.m. gets associated polyneuropathy. This is due to an IgG lambda paraprotein. He has seen hematology and  a recommendation was made to follow the paraprotein and refer back if it is increasing significantly. 2.  I discussed that there is an association between MGUS and multiple myeloma but that only about 2-3% of people convert on an annual basis. 3.   Due to his COPD, he is not a candidate for beta blocker. Additionally he has daytime sleepiness and I would not recommend Mysoline or a benzodiazepine. If the tremor worsens significantly we will need to reconsider this. 4.   He will return to see me in 6 months or sooner if there are new or worsening neurologic symptoms. Taya Ashbaugh A. Felecia Shelling, MD, PhD 6/50/3546, 5:68 PM Certified in Neurology, Clinical Neurophysiology, Sleep Medicine, Pain Medicine and Neuroimaging  [facility] [address], [place], [place] [address] [phone]

## 2016-12-18 ENCOUNTER — Encounter: Payer: Self-pay | Admitting: Emergency Medicine

## 2016-12-18 ENCOUNTER — Ambulatory Visit (INDEPENDENT_AMBULATORY_CARE_PROVIDER_SITE_OTHER): Payer: Medicare Other | Admitting: Emergency Medicine

## 2016-12-18 DIAGNOSIS — J301 Allergic rhinitis due to pollen: Secondary | ICD-10-CM

## 2016-12-18 DIAGNOSIS — G4733 Obstructive sleep apnea (adult) (pediatric): Secondary | ICD-10-CM

## 2016-12-18 DIAGNOSIS — J449 Chronic obstructive pulmonary disease, unspecified: Secondary | ICD-10-CM

## 2016-12-18 DIAGNOSIS — J441 Chronic obstructive pulmonary disease with (acute) exacerbation: Secondary | ICD-10-CM | POA: Diagnosis not present

## 2016-12-18 NOTE — Progress Notes (Signed)
Subjective:  Patient ID: Richard Rosales, male    DOB: [birth year], 83y.o.   MRN: [id number]  83 yo man, former smoker (quit'80)  Hx of COPD &  OSA on CPAP 12 cm. He has  hx of HTN, A Fib, chronic diastolic dysfxn and LE edema, GERD, diverticulosis, remote positive PPD. Allergic rhinitis Has persistent wheeze, chronic bronchitis phenotype.   ROV 08/06/16 -- is a follow-up visit for patient with a history of COPD, obesity, chronic bronchitis and obstructive sleep apnea. He underwent walking O2 titration today, documented desats and titrated to 2l/min. He reports that he has felt weak for last 3 days, worsening cough that is occasionally productive, especially in the am.  Remains on chlorpheniramine, astelin NS. Remains on spiriva and symbicort. Uses albuterol very rarely.   ROV 12/18/16 -- Patient with a history of COPD and a chronic bronchitic phenotype, chronic hypoxemia, obstructive sleep apnea on CPAP. With allergic rhinitis. He was treated in February for an acute exacerbation with antibiotics and steroids. He has been followed by neurology for a polyneuropathy due to MGUS that has caused numbness and gait disturbance. He reports that he has been doing fairly well from COPD, has terrible arthritis that has been limiting him. He is not having any wheeze. Has some daily cough - more when he is outside. Usually non productive. He is using sinus rinses, on chlorpheniramine bid.  Symbicort and Spiriva, stabl;e regimen. Oxygen at 2L/min.    Vitals:   12/18/16 1352  BP: (!) 124/58  Pulse: (!) 58  SpO2: 92%  Weight: 234 lb (106.1 kg)  Height: 5\' 8"  (1.727 m)    Gen: Pleasant, kyphotic, in no distress,  normal affect  ENT: No lesions,  mouth clear,  oropharynx clear, no postnasal drip, some nasal obstruction  Neck: No JVD, no TMG, no carotid bruits  Lungs: No use of accessory muscles, clear without rales or rhonchi, distant  Cardiovascular: RRR, heart sounds normal, no murmur or gallops, 1+  peripheral edema  Musculoskeletal: No deformities, no cyanosis or clubbing  Neuro: alert, non focal  Skin: Warm, no lesions or rashes     11/18/13 --  COMPARISON: No prior CT chest. Multiple prior chest x-rays, most  recently 11/17/2013.  FINDINGS:  Contrast opacification of the pulmonary arteries is excellent. No  filling defects within either main pulmonary artery or their  branches in either lung to suggest pulmonary embolism. Heart  markedly enlarged with left ventricular predominance and evidence of  left ventricular hypertrophy. Small pericardial effusion. Moderate 3  vessel coronary atherosclerosis. Severe atherosclerosis involving  the thoracic and upper abdominal aorta and their visualized branches  without evidence of aneurysm. Tortuous thoracic and upper abdominal  aorta.  Emphysematous changes throughout both lungs. Subsegmental airspace  consolidation with air bronchograms in the deep posterior right  lower lobe. Lungs otherwise clear. No pulmonary parenchymal nodules  or masses. No pleural effusions.  Thyroid gland enlargement with multiple nodules, the largest nodules  adjacent to one another in the right lobe measuring approximately  2.5 x 2.2 x 3.1 cm in the mid pole and 3.0 x 3.0 x 2.1 cm in the  lower pole. Scattered normal size lymph nodes in the mediastinum,  hila, and axilla without significant lymphadenopathy. Mild bilateral  gynecomastia.  Irregularity involving the contour of the visualized liver with  relative enlargement of the left lobe. Cysts involving the mid  portions of the visualized kidneys. Moderate pancreatic atrophy.  Hiatal hernia containing predominately intra-abdominal fat.  Diverticulosis involving  the visualized distal transverse and  proximal descending colon without evidence of acute diverticulitis.  Bone window images demonstrated diffuse thoracic spondylosis and  exaggeration of the usual thoracic kyphosis.  Review of the MIP  images confirms the above findings.   IMPRESSION:  1. No evidence of pulmonary embolism.  2. COPD/emphysema. Atelectasis involving the right lower lobe. No  acute cardiopulmonary disease otherwise.  3. Multinodular goiter, with adjacent 3 cm nodules in the right  lobe.  4. Hepatic cirrhosis.  5. Moderate pancreatic atrophy.  6. Diverticulosis involving the visualized distal transverse and  proximal descending colon.  7. Cardiomegaly with 3 vessel coronary artery disease. Left  ventricular hypertrophy. Small pericardial effusion.  8. Mild bilateral gynecomastia   Assessment & Plan:   COPD (chronic obstructive pulmonary disease) Please continue your Spiriva and Symbicort Use albuterol as needed for shortness of breath Wear your oxygen at 2L/min with exertion.  Follow with Dr Lamonte Sakai in 4 months or sooner if you have any problems.  OSA and COPD overlap syndrome ([place]) CPAP as he is using it  Allergic rhinitis Continue your chlorpheniramine twice a day.     Baltazar Apo, MD, PhD 12/18/2016, 2:06 PM [facility] [phone] or if no answer [phone]

## 2016-12-18 NOTE — Assessment & Plan Note (Signed)
CPAP as he is using it

## 2016-12-18 NOTE — Assessment & Plan Note (Signed)
Please continue your Spiriva and Symbicort Use albuterol as needed for shortness of breath Wear your oxygen at 2L/min with exertion.  Follow with Dr Lamonte Sakai in 4 months or sooner if you have any problems.

## 2016-12-18 NOTE — Patient Instructions (Addendum)
Please continue your Spiriva and Symbicort Use albuterol as needed for shortness of breath Wear your oxygen at 2L/min with exertion.  Continue your chlorpheniramine twice a day.  Follow with Dr Lamonte Sakai in 4 months or sooner if you have any problems.

## 2016-12-18 NOTE — Assessment & Plan Note (Signed)
Continue your chlorpheniramine twice a day.

## 2016-12-25 ENCOUNTER — Telehealth: Payer: Self-pay | Admitting: Emergency Medicine

## 2016-12-25 NOTE — Telephone Encounter (Signed)
While on the phone with Tiffany from [place] about another patient, she had further questions about this patient. Tiffany states they set patient up with new cpap machine but when they reviewed the office notes, there was nothing documented as to why patient needed a new machine.    ATC pt x1 to get further information from what he discussed during his ov with RB on 08/06/16, phone just rung and unable to leave vm  RB- please advise if you remember any additional information as to why patient wanted to obtain a new machine

## 2016-12-30 NOTE — Telephone Encounter (Signed)
I didn't document anything about why he needs new equipment and I can't remember. Please call him and ask if his tubing, device, mask are OK. Thanks.

## 2016-12-30 NOTE — Telephone Encounter (Signed)
Spoke with pt. States that he has already received his replacement CPAP machine. He does not need any supplies at this time.  I am assuming that [facility] is needing documentation stating why we ordered a replacement CPAP machine. This was ordered back in April. We received a call from [facility] stating that the pt had contacted them about wanting a new machine.  RB - you will probably have to addend your last OV note and make some documentation about the pt needing a new CPAP machine.

## 2017-01-01 ENCOUNTER — Ambulatory Visit (INDEPENDENT_AMBULATORY_CARE_PROVIDER_SITE_OTHER): Payer: Medicare Other | Admitting: Physician Assistant

## 2017-01-01 VITALS — BP 116/64 | HR 58 | Temp 97.7°F | Resp 20 | Wt 233.0 lb

## 2017-01-01 DIAGNOSIS — I5032 Chronic diastolic (congestive) heart failure: Secondary | ICD-10-CM

## 2017-01-01 DIAGNOSIS — R601 Generalized edema: Secondary | ICD-10-CM

## 2017-01-01 NOTE — Patient Instructions (Signed)
[facility] Ask your health care provider which exercises are safe for you. Do exercises exactly as told by your health care provider and adjust them as directed. It is normal to feel mild stretching, pulling, tightness, or discomfort as you do these exercises, but you should stop right away if you feel sudden pain or your pain gets worse.Do not begin these exercises until told by your health care provider. Stretching and range of motion exercises These exercises warm up your muscles and joints and improve the movement and flexibility of your shoulder. These exercises can also help to relieve pain, numbness, and tingling. Exercise A: Flexion, standing  1. Stand and hold a broomstick, a cane, or a similar object. Place your hands a little more than shoulder-width apart on the object. Your left / right hand should be palm-up, and your other hand should be palm-down. 2. Push the stick to raise your left / right arm out to your side and then over your head. Use your other hand to help move the stick. Stop when you feel a stretch in your shoulder, or when you reach the angle that is recommended by your health care provider. ? Avoid shrugging your shoulder while you raise your arm. Keep your shoulder blade tucked down toward your spine. 3. Hold for __________ seconds. 4. Slowly return to the starting position. Repeat __________ times. Complete this exercise __________ times a day. Strengthening exercises These exercises build strength and endurance in your shoulder. Endurance is the ability to use your muscles for a long time, even after your muscles get tired. Exercise E: Scapular depression and adduction 1. Sit on a stable chair. Support your arms in front of you with pillows, armrests, or a tabletop. Keep your elbows in line with the sides of your body. 2. Gently move your shoulder blades down toward your middle back. Relax the muscles on the tops of your shoulders and in the back of your  neck. 3. Hold for __________ seconds. 4. Slowly release the tension and relax your muscles completely before doing this exercise again. 5. After you have practiced this exercise, try doing the exercise without the arm support. Then, try the exercise while standing instead of sitting. Repeat __________ times. Complete this exercise __________ times a day. Exercise F: Shoulder abduction, isometric  1. Stand or sit about 4-6 inches (10-15 cm) from a wall with your left / right side facing the wall. 2. Bend your left / right elbow and gently press your elbow against the wall. 3. Increase the pressure slowly until you are pressing as hard as you can without shrugging your shoulder. 4. Hold for __________ seconds. 5. Slowly release the tension and relax your muscles completely. Repeat __________ times. Complete this exercise __________ times a day. Exercise G: Shoulder flexion, isometric  1. Stand or sit about 4-6 inches (10-15 cm) away from a wall with your left / right side facing the wall. 2. Keep your left / right elbow straight and gently press the top of your fist against the wall. Increase the pressure slowly until you are pressing as hard as you can without shrugging your shoulder. 3. Hold for __________ seconds. 4. Slowly release the tension and relax your muscles completely. Repeat __________ times. Complete this exercise __________ times a day. Exercise H: Internal rotation  1. Sit in a stable chair without armrests, or stand. Secure an exercise band at your left / right side, at elbow height. 2. Place a soft object, such as a  folded towel or a small pillow, under your left / right upper arm so your elbow is a few inches (about 8 cm) away from your side. 3. Hold the end of the exercise band so the band stretches. 4. Keeping your elbow pressed against the soft object under your arm, move your forearm across your body toward your abdomen. Keep your body steady so the movement is only  coming from your shoulder. 5. Hold for __________ seconds. 6. Slowly return to the starting position. Repeat __________ times. Complete this exercise __________ times a day. Exercise I: External rotation  1. Sit in a stable chair without armrests, or stand. 2. Secure an exercise band at your left / right side, at elbow height. 3. Place a soft object, such as a folded towel or a small pillow, under your left / right upper arm so your elbow is a few inches (about 8 cm) away from your side. 4. Hold the end of the exercise band so the band stretches. 5. Keeping your elbow pressed against the soft object under your arm, move your forearm out, away from your abdomen. Keep your body steady so the movement is only coming from your shoulder. 6. Hold for __________ seconds. 7. Slowly return to the starting position. Repeat __________ times. Complete this exercise __________ times a day. Exercise J: Shoulder extension 1. Sit in a stable chair without armrests, or stand. Secure an exercise band to a stable object in front of you so the band is at shoulder height. 2. Hold one end of the exercise band in each hand. Your palms should face each other. 3. Straighten your elbows and lift your hands up to shoulder height. 4. Step back, away from the secured end of the exercise band, until the band stretches. 5. Squeeze your shoulder blades together and pull your hands down to the sides of your thighs. Stop when your hands are straight down by your sides. Do not let your hands go behind your body. 6. Hold for __________ seconds. 7. Slowly return to the starting position. Repeat __________ times. Complete this exercise __________ times a day. Exercise K: Shoulder extension, prone  1. Lie on your abdomen on a firm surface so your left / right arm hangs over the edge. 2. Hold a __________ weight in your hand so your palm faces in toward your body. Your arm should be straight. 3. Squeeze your shoulder blade down  toward the middle of your back. 4. Slowly raise your arm behind you, up to the height of the surface that you are lying on. Keep your arm straight. 5. Hold for __________ seconds. 6. Slowly return to the starting position and relax your muscles. Repeat __________ times. Complete this exercise __________ times a day. Exercise L: Horizontal abduction, prone 1. Lie on your abdomen on a firm surface so your left / right arm hangs over the edge. 2. Hold a __________ weight in your hand so your palm faces toward your feet. Your arm should be straight. 3. Squeeze your shoulder blade down toward the middle of your back. 4. Bend your elbow so your hand moves up, until your elbow is bent to an "L" shape (90 degrees). With your elbow bent, slowly move your forearm forward and up. Raise your hand up to the height of the surface that you are lying on. ? Your upper arm should not move, and your elbow should stay bent. ? At the top of the movement, your palm should face the floor. 5.  Hold for __________ seconds. 6. Slowly return to the starting position and relax your muscles. Repeat __________ times. Complete this exercise __________ times a day. Exercise M: Horizontal abduction, standing 1. Sit on a stable chair, or stand. 2. Secure an exercise band to a stable object in front of you so the band is at shoulder height. 3. Hold one end of the exercise band in each hand. 4. Straighten your elbows and lift your hands straight in front of you, up to shoulder height. Your palms should face down, toward the floor. 5. Step back, away from the secured end of the exercise band, until the band stretches. 6. Move your arms out to your sides, and keep your arms straight. 7. Hold for __________ seconds. 8. Slowly return to the starting position. Repeat __________ times. Complete this exercise __________ times a day. Exercise N: Scapular retraction and elevation 1. Sit on a stable chair, or stand. 2. Secure an  exercise band to a stable object in front of you so the band is at shoulder height. 3. Hold one end of the exercise band in each hand. Your palms should face each other. 4. Sit in a stable chair without armrests, or stand. 5. Step back, away from the secured end of the exercise band, until the band stretches. 6. Squeeze your shoulder blades together and lift your hands over your head. Keep your elbows straight. 7. Hold for __________ seconds. 8. Slowly return to the starting position. Repeat __________ times. Complete this exercise __________ times a day. This information is not intended to replace advice given to you by your health care provider. Make sure you discuss any questions you have with your health care provider. Document Released: 06/09/2005 Document Revised: 02/14/2016 Document Reviewed: 04/26/2015 Elsevier Interactive Patient Education  [employer].

## 2017-01-01 NOTE — Telephone Encounter (Signed)
Will do!

## 2017-01-01 NOTE — Progress Notes (Signed)
Assessment and Plan: Chronic diastolic heart failure (HCC) Continue weight loss  Generalized edema wlil send in zaroxyln pending kidney function - CBC with Differential/Platelet - BASIC METABOLIC PANEL WITH GFR  Leg weakness Patient is walking with walker at home and in wheel chair in the office Clearly has trouble/weakness with legs, he would like to try to stay active, would like to see ortho for possible braces for his legs, versus injections,  but we both agreed that eventually with all of his complications he will be wheel chair dependent at some time, he will set up ortho if he feels he would like to try Has seen neuro Has friend drive him, has trouble with his ADLs, breathing takes about 2 hours with resting.  Continue weight loss  HPI 83 y.o.male presents with history of HTN, afib, diastolic CHF, COPD/pHTN, OSA on PAP returning for 1 month follow for refractory edema and venous insuff. Weight is unchanged but he feels that his legs are improved. He is on 40 mg of lasix and 40 mg in the afternoon and zaroxyolyn 5 mg every day. He is on magnesium and potassium lite salt.   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   BMI is Body mass index is 35.43 kg/m., he is working on diet and exercise. Wt Readings from Last 3 Encounters:  01/01/17 233 lb (105.7 kg)  12/18/16 234 lb (106.1 kg)  11/25/16 233 lb (105.7 kg)   Blood pressure 116/64, pulse (!) 58, temperature 97.7 F (36.5 C), resp. rate 20, weight 233 lb (105.7 kg).   Past Medical History:  Diagnosis Date  . Acute bronchitis   . Adenomatous colon polyp   . Allergic rhinitis   . Anemia    iron deficient  . Atrial fibrillation ([place])   . Atrial flutter ([place])   . Chronic gastritis   . COPD (chronic obstructive pulmonary disease) ([place])    . Diastolic dysfunction   . Diverticulosis of colon    recurrent GI bleeding  . DJD (degenerative joint disease)   . GERD (gastroesophageal reflux disease)   . GI bleed   . HTN (hypertension)   . Hyperplastic colon polyp 2003  . Hypothyroidism   . Moderate aortic insufficiency   . OSA (obstructive sleep apnea)    compliants w/ CPAP  . Positive PPD    remote  . Shortness of breath   . Venous insufficiency      Allergies  Allergen Reactions  . Ace Inhibitors Cough  . Beta Adrenergic Blockers Other (See Comments)    bradycardia  . Decadron [Dexamethasone]     unknown  . Fluticasone-Salmeterol     Doesn't remember  . Hytrin [Terazosin]     Nasal congestion   . Prednisone     Shakes / tremors  . Vioxx [Rofecoxib] Other (See Comments)    dyspepsia    Current Outpatient Prescriptions on File Prior to Visit  Medication Sig  . acetaminophen-codeine (TYLENOL #3) 300-30 MG tablet Take 1 tablet by mouth every 4 (four) hours as needed for moderate pain.  Marland Kitchen albuterol (PROVENTIL HFA;VENTOLIN HFA) 108 (90 Base) MCG/ACT inhaler Inhale 2 puffs into the lungs every 6 (six) hours as needed for wheezing or shortness of breath.  Marland Kitchen albuterol (PROVENTIL) (2.5 MG/3ML) 0.083% nebulizer solution Take 3 mLs (2.5 mg total) by  nebulization every 4 (four) hours.  Marland Kitchen amoxicillin (AMOXIL) 500 MG capsule as directed. (DENTAL)  . aspirin 81 MG tablet Take 1 tablet 3 days per week  . azelastine (ASTELIN) 0.1 % nasal spray USE 1 SPRAY IN EACH NOSTRIL TWICE A DAY  . b complex vitamins tablet Take 1 tablet by mouth every morning.   . chlorpheniramine (CHLOR-TRIMETON) 4 MG tablet Take 1 tablet by mouth in the morning and 1 tablet at bedtime  . Cholecalciferol (VITAMIN D) 2000 UNITS CAPS Take 2 capsules by mouth daily.   Marland Kitchen DILT-XR 240 MG 24 hr capsule TAKE 1 CAPSULE TWICE A DAY  . docusate sodium (COLACE) 100 MG capsule Take 100 mg by mouth 2 (two) times daily.    . fish oil-omega-3 fatty acids 1000 MG  capsule Take 1 g by mouth 2 (two) times daily.    . furosemide (LASIX) 40 MG tablet Take 40 mg by mouth daily.  Marland Kitchen glucosamine-chondroitin 500-400 MG tablet Take 1 tablet by mouth 1 day or 1 dose.  Marland Kitchen guaifenesin (HUMIBID E) 400 MG TABS Take 400 mg by mouth 2 (two) times daily.    Marland Kitchen HYDROcodone-acetaminophen (NORCO/VICODIN) 5-325 MG per tablet Take 1 tablet by mouth every 6 (six) hours as needed for moderate pain (Only takes during CT/MRI testing).   Marland Kitchen levothyroxine (SYNTHROID, LEVOTHROID) 100 MCG tablet Take 1 tablet (100 mcg total) by mouth daily before breakfast.  . losartan (COZAAR) 100 MG tablet TAKE 1 TABLET DAILY  . omeprazole (PRILOSEC) 40 MG capsule TAKE 1 CAPSULE TWICE A DAY  . SPIRIVA HANDIHALER 18 MCG inhalation capsule INHALE THE CONTENTS OF 1 CAPSULE DAILY  . SYMBICORT 160-4.5 MCG/ACT inhaler USE 2 INHALATIONS TWICE A DAY  . metolazone (ZAROXOLYN) 5 MG tablet Take 1 tablet daily as directed for fluid & swelling   No current facility-administered medications on file prior to visit.     ROS: all negative except above.   Physical Exam: Filed Weights   01/01/17 1654  Weight: 233 lb (105.7 kg)   BP 116/64   Pulse (!) 58   Temp 97.7 F (36.5 C)   Resp 20   Wt 233 lb (105.7 kg)   BMI 35.43 kg/m  General appearance: alert, no distress, WD/WN, male HEENT: normocephalic, sclerae anicteric, TMs pearly, nares patent, no discharge or erythema, pharynx normal Oral cavity: MMM, no lesions Neck: supple, no lymphadenopathy, no thyromegaly, no masses Heart: RRR, normal S1, S2, no murmurs Lungs: CTA bilaterally, decreased breath sounds diffusely, 2 L [place] in place, no wheezes, rhonchi, or rales Abdomen: +bs, soft, non tender, non distended, no masses, no hepatomegaly, no splenomegaly Musculoskeletal: nontender, no swelling, no obvious deformity Extremities: 3+ edema, no cyanosis, no clubbing Pulses: 2+ symmetric, upper and lower extremities, normal cap refill Neurological: alert,  oriented x 3, CN2-12 intact, strength 3/5 bilateral lower extremities and lower extremities,DTRs 2+ throughout, no cerebellar signs, gait slow/antalgic, patient in wheelchair.  Psychiatric: normal affect, behavior normal, pleasant     Vicie Mutters, PA-C 5:14 PM [facility]

## 2017-01-02 ENCOUNTER — Other Ambulatory Visit: Payer: Self-pay | Admitting: Physician Assistant

## 2017-01-02 DIAGNOSIS — R601 Generalized edema: Secondary | ICD-10-CM

## 2017-01-02 LAB — CBC WITH DIFFERENTIAL/PLATELET
BASOS ABS: 89 {cells}/uL (ref 0–200)
Basophils Relative: 1 %
EOS ABS: 623 {cells}/uL — AB (ref 15–500)
EOS PCT: 7 %
HEMATOCRIT: 41.4 % (ref 38.5–50.0)
HEMOGLOBIN: 13.7 g/dL (ref 13.2–17.1)
LYMPHS ABS: 1691 {cells}/uL (ref 850–3900)
Lymphocytes Relative: 19 %
MCH: 30.6 pg (ref 27.0–33.0)
MCHC: 33.1 g/dL (ref 32.0–36.0)
MCV: 92.4 fL (ref 80.0–100.0)
MPV: 10.1 fL (ref 7.5–12.5)
Monocytes Absolute: 979 cells/uL — ABNORMAL HIGH (ref 200–950)
Monocytes Relative: 11 %
NEUTROS ABS: 5518 {cells}/uL (ref 1500–7800)
NEUTROS PCT: 62 %
Platelets: 218 10*3/uL (ref 140–400)
RBC: 4.48 MIL/uL (ref 4.20–5.80)
RDW: 13.5 % (ref 11.0–15.0)
WBC: 8.9 10*3/uL (ref 3.8–10.8)

## 2017-01-02 LAB — BASIC METABOLIC PANEL WITH GFR
BUN: 29 mg/dL — AB (ref 7–25)
CALCIUM: 9.2 mg/dL (ref 8.6–10.3)
CHLORIDE: 92 mmol/L — AB (ref 98–110)
CO2: 32 mmol/L — AB (ref 20–31)
CREATININE: 1.04 mg/dL (ref 0.70–1.11)
GFR, Est African American: 76 mL/min (ref >=60)
GFR, Est Non African American: 66 mL/min (ref >=60)
GLUCOSE: 86 mg/dL (ref 65–99)
Potassium: 3.8 mmol/L (ref 3.5–5.3)
Sodium: 135 mmol/L (ref 135–146)

## 2017-01-02 MED ORDER — METOLAZONE 5 MG PO TABS: ORAL_TABLET | ORAL | 0 refills | Status: DC

## 2017-01-05 ENCOUNTER — Telehealth: Payer: Self-pay | Admitting: Podiatry

## 2017-01-05 NOTE — Telephone Encounter (Signed)
Yes I'm trying to contact Dr. Gardiner Barefoot. If someone could please call me back at [phone] and see if you could help me out please. Thank you.

## 2017-01-05 NOTE — Progress Notes (Signed)
Pt aware of lab results & voiced understanding of those results. Pt has an follow up appt on August 23rd.

## 2017-01-05 NOTE — Telephone Encounter (Signed)
Pt states he needs an appt to get his toenails trimmed they're about to poke out the end of his shoes. Transferred pt to schedulers.

## 2017-01-13 ENCOUNTER — Ambulatory Visit (INDEPENDENT_AMBULATORY_CARE_PROVIDER_SITE_OTHER): Payer: Medicare Other | Admitting: Podiatry

## 2017-01-13 ENCOUNTER — Encounter: Payer: Self-pay | Admitting: Podiatry

## 2017-01-13 DIAGNOSIS — M79676 Pain in unspecified toe(s): Secondary | ICD-10-CM

## 2017-01-13 DIAGNOSIS — B351 Tinea unguium: Secondary | ICD-10-CM | POA: Diagnosis not present

## 2017-01-13 NOTE — Telephone Encounter (Signed)
I added an addendum to his note from 07/2016 to reflect fact that his old machine was showing signs of age, his request for a new CPAP machine

## 2017-01-13 NOTE — Progress Notes (Signed)
Patient ID: Richard Rosales, male   DOB: 08/13/1930, 83 y.o.   MRN: [id number] Complaint:  Visit Type: Patient returns to my office for continued preventative foot care services. Complaint: Patient states" my nails have grown long and thick and become painful to walk and wear shoes".  He also has painful callus on the tip of his big toe right foot. Patient has been diagnosed with DM with no complications. He presents for preventative foot care services. No changes to ROS  Podiatric Exam: Vascular: dorsalis pedis and posterior tibial pulses are absent bilateral. Capillary return is immediate. Temperature gradient is decreased.. Skin turgor WNL  Sensorium: Normal Semmes Weinstein monofilament test. Normal tactile sensation bilaterally. Nail Exam: Pt has thick disfigured discolored nails with subungual debris noted bilateral entire nail hallux through fifth toenails.   Ulcer Exam: There is no evidence of ulcer or pre-ulcerative changes or infection. Orthopedic Exam: Muscle tone and strength are WNL. No limitations in general ROM. No crepitus or effusions noted. Foot type and digits show no abnormalities. Bony prominences are unremarkable. Skin:  Asymptomatic porokeratosis present at the distal aspect right hallux.. No infection or ulcers  Diagnosis:  Tinea unguium, Pain in right toe, pain in left toe.   Treatment & Plan Procedures and Treatment: Consent by patient was obtained for treatment procedures. The patient understood the discussion of treatment and procedures well. All questions were answered thoroughly reviewed. Debridement of mycotic and hypertrophic toenails, 1 through 5 bilateral and clearing of subungual debris. No ulceration, no infection noted.   Return Visit-Office Procedure: Patient instructed to return to the office for a follow up visit 3 months for continued evaluation and treatment.  Gardiner Barefoot DPM

## 2017-02-02 ENCOUNTER — Telehealth: Payer: Self-pay | Admitting: Internal Medicine

## 2017-02-02 NOTE — Telephone Encounter (Signed)
Patient's wife called to report, patient fell 02-01-17, wife called 911 to get patinet up. Calling to request Home Health evaluation, OK per Dr Melford Aase, faxed over [facility] order to [place].

## 2017-02-05 DIAGNOSIS — F0390 Unspecified dementia without behavioral disturbance: Secondary | ICD-10-CM | POA: Diagnosis not present

## 2017-02-05 DIAGNOSIS — G4733 Obstructive sleep apnea (adult) (pediatric): Secondary | ICD-10-CM | POA: Diagnosis not present

## 2017-02-05 DIAGNOSIS — Z7982 Long term (current) use of aspirin: Secondary | ICD-10-CM | POA: Diagnosis not present

## 2017-02-05 DIAGNOSIS — J449 Chronic obstructive pulmonary disease, unspecified: Secondary | ICD-10-CM | POA: Diagnosis not present

## 2017-02-05 DIAGNOSIS — I5032 Chronic diastolic (congestive) heart failure: Secondary | ICD-10-CM | POA: Diagnosis not present

## 2017-02-05 DIAGNOSIS — Z9181 History of falling: Secondary | ICD-10-CM | POA: Diagnosis not present

## 2017-02-05 DIAGNOSIS — K219 Gastro-esophageal reflux disease without esophagitis: Secondary | ICD-10-CM | POA: Diagnosis not present

## 2017-02-05 DIAGNOSIS — G629 Polyneuropathy, unspecified: Secondary | ICD-10-CM | POA: Diagnosis not present

## 2017-02-05 DIAGNOSIS — I110 Hypertensive heart disease with heart failure: Secondary | ICD-10-CM | POA: Diagnosis not present

## 2017-02-05 DIAGNOSIS — I4891 Unspecified atrial fibrillation: Secondary | ICD-10-CM | POA: Diagnosis not present

## 2017-02-05 DIAGNOSIS — D649 Anemia, unspecified: Secondary | ICD-10-CM | POA: Diagnosis not present

## 2017-02-09 DIAGNOSIS — I110 Hypertensive heart disease with heart failure: Secondary | ICD-10-CM | POA: Diagnosis not present

## 2017-02-09 DIAGNOSIS — J449 Chronic obstructive pulmonary disease, unspecified: Secondary | ICD-10-CM | POA: Diagnosis not present

## 2017-02-09 DIAGNOSIS — I5032 Chronic diastolic (congestive) heart failure: Secondary | ICD-10-CM | POA: Diagnosis not present

## 2017-02-09 DIAGNOSIS — F0390 Unspecified dementia without behavioral disturbance: Secondary | ICD-10-CM | POA: Diagnosis not present

## 2017-02-09 DIAGNOSIS — G629 Polyneuropathy, unspecified: Secondary | ICD-10-CM | POA: Diagnosis not present

## 2017-02-09 DIAGNOSIS — I4891 Unspecified atrial fibrillation: Secondary | ICD-10-CM | POA: Diagnosis not present

## 2017-02-11 ENCOUNTER — Other Ambulatory Visit: Payer: Self-pay | Admitting: Internal Medicine

## 2017-02-11 DIAGNOSIS — I110 Hypertensive heart disease with heart failure: Secondary | ICD-10-CM | POA: Diagnosis not present

## 2017-02-11 DIAGNOSIS — I5032 Chronic diastolic (congestive) heart failure: Secondary | ICD-10-CM | POA: Diagnosis not present

## 2017-02-11 DIAGNOSIS — J449 Chronic obstructive pulmonary disease, unspecified: Secondary | ICD-10-CM | POA: Diagnosis not present

## 2017-02-11 DIAGNOSIS — I4891 Unspecified atrial fibrillation: Secondary | ICD-10-CM | POA: Diagnosis not present

## 2017-02-11 DIAGNOSIS — F0390 Unspecified dementia without behavioral disturbance: Secondary | ICD-10-CM | POA: Diagnosis not present

## 2017-02-11 DIAGNOSIS — R5381 Other malaise: Secondary | ICD-10-CM | POA: Insufficient documentation

## 2017-02-11 DIAGNOSIS — G629 Polyneuropathy, unspecified: Secondary | ICD-10-CM | POA: Diagnosis not present

## 2017-02-12 ENCOUNTER — Ambulatory Visit: Payer: Self-pay | Admitting: Internal Medicine

## 2017-02-12 DIAGNOSIS — F0390 Unspecified dementia without behavioral disturbance: Secondary | ICD-10-CM | POA: Diagnosis not present

## 2017-02-12 DIAGNOSIS — I5032 Chronic diastolic (congestive) heart failure: Secondary | ICD-10-CM | POA: Diagnosis not present

## 2017-02-12 DIAGNOSIS — G629 Polyneuropathy, unspecified: Secondary | ICD-10-CM | POA: Diagnosis not present

## 2017-02-12 DIAGNOSIS — J449 Chronic obstructive pulmonary disease, unspecified: Secondary | ICD-10-CM | POA: Diagnosis not present

## 2017-02-12 DIAGNOSIS — I110 Hypertensive heart disease with heart failure: Secondary | ICD-10-CM | POA: Diagnosis not present

## 2017-02-12 DIAGNOSIS — I4891 Unspecified atrial fibrillation: Secondary | ICD-10-CM | POA: Diagnosis not present

## 2017-02-13 DIAGNOSIS — F0390 Unspecified dementia without behavioral disturbance: Secondary | ICD-10-CM | POA: Diagnosis not present

## 2017-02-13 DIAGNOSIS — I110 Hypertensive heart disease with heart failure: Secondary | ICD-10-CM | POA: Diagnosis not present

## 2017-02-13 DIAGNOSIS — I4891 Unspecified atrial fibrillation: Secondary | ICD-10-CM | POA: Diagnosis not present

## 2017-02-13 DIAGNOSIS — J449 Chronic obstructive pulmonary disease, unspecified: Secondary | ICD-10-CM | POA: Diagnosis not present

## 2017-02-13 DIAGNOSIS — G629 Polyneuropathy, unspecified: Secondary | ICD-10-CM | POA: Diagnosis not present

## 2017-02-13 DIAGNOSIS — I5032 Chronic diastolic (congestive) heart failure: Secondary | ICD-10-CM | POA: Diagnosis not present

## 2017-02-16 DIAGNOSIS — I5032 Chronic diastolic (congestive) heart failure: Secondary | ICD-10-CM | POA: Diagnosis not present

## 2017-02-16 DIAGNOSIS — I4891 Unspecified atrial fibrillation: Secondary | ICD-10-CM | POA: Diagnosis not present

## 2017-02-16 DIAGNOSIS — I110 Hypertensive heart disease with heart failure: Secondary | ICD-10-CM | POA: Diagnosis not present

## 2017-02-16 DIAGNOSIS — F0390 Unspecified dementia without behavioral disturbance: Secondary | ICD-10-CM | POA: Diagnosis not present

## 2017-02-16 DIAGNOSIS — G629 Polyneuropathy, unspecified: Secondary | ICD-10-CM | POA: Diagnosis not present

## 2017-02-16 DIAGNOSIS — J449 Chronic obstructive pulmonary disease, unspecified: Secondary | ICD-10-CM | POA: Diagnosis not present

## 2017-02-17 DIAGNOSIS — I110 Hypertensive heart disease with heart failure: Secondary | ICD-10-CM | POA: Diagnosis not present

## 2017-02-17 DIAGNOSIS — G629 Polyneuropathy, unspecified: Secondary | ICD-10-CM | POA: Diagnosis not present

## 2017-02-17 DIAGNOSIS — I4891 Unspecified atrial fibrillation: Secondary | ICD-10-CM | POA: Diagnosis not present

## 2017-02-17 DIAGNOSIS — F0390 Unspecified dementia without behavioral disturbance: Secondary | ICD-10-CM | POA: Diagnosis not present

## 2017-02-17 DIAGNOSIS — J449 Chronic obstructive pulmonary disease, unspecified: Secondary | ICD-10-CM | POA: Diagnosis not present

## 2017-02-17 DIAGNOSIS — I5032 Chronic diastolic (congestive) heart failure: Secondary | ICD-10-CM | POA: Diagnosis not present

## 2017-02-19 ENCOUNTER — Other Ambulatory Visit: Payer: Self-pay | Admitting: Emergency Medicine

## 2017-02-19 DIAGNOSIS — G629 Polyneuropathy, unspecified: Secondary | ICD-10-CM | POA: Diagnosis not present

## 2017-02-19 DIAGNOSIS — J449 Chronic obstructive pulmonary disease, unspecified: Secondary | ICD-10-CM | POA: Diagnosis not present

## 2017-02-19 DIAGNOSIS — F0390 Unspecified dementia without behavioral disturbance: Secondary | ICD-10-CM | POA: Diagnosis not present

## 2017-02-19 DIAGNOSIS — I110 Hypertensive heart disease with heart failure: Secondary | ICD-10-CM | POA: Diagnosis not present

## 2017-02-19 DIAGNOSIS — I4891 Unspecified atrial fibrillation: Secondary | ICD-10-CM | POA: Diagnosis not present

## 2017-02-19 DIAGNOSIS — I5032 Chronic diastolic (congestive) heart failure: Secondary | ICD-10-CM | POA: Diagnosis not present

## 2017-02-20 DIAGNOSIS — J449 Chronic obstructive pulmonary disease, unspecified: Secondary | ICD-10-CM | POA: Diagnosis not present

## 2017-02-20 DIAGNOSIS — I4891 Unspecified atrial fibrillation: Secondary | ICD-10-CM | POA: Diagnosis not present

## 2017-02-20 DIAGNOSIS — I5032 Chronic diastolic (congestive) heart failure: Secondary | ICD-10-CM | POA: Diagnosis not present

## 2017-02-20 DIAGNOSIS — F0390 Unspecified dementia without behavioral disturbance: Secondary | ICD-10-CM | POA: Diagnosis not present

## 2017-02-20 DIAGNOSIS — G629 Polyneuropathy, unspecified: Secondary | ICD-10-CM | POA: Diagnosis not present

## 2017-02-20 DIAGNOSIS — I110 Hypertensive heart disease with heart failure: Secondary | ICD-10-CM | POA: Diagnosis not present

## 2017-02-23 DIAGNOSIS — F0390 Unspecified dementia without behavioral disturbance: Secondary | ICD-10-CM | POA: Diagnosis not present

## 2017-02-23 DIAGNOSIS — G629 Polyneuropathy, unspecified: Secondary | ICD-10-CM | POA: Diagnosis not present

## 2017-02-23 DIAGNOSIS — J449 Chronic obstructive pulmonary disease, unspecified: Secondary | ICD-10-CM | POA: Diagnosis not present

## 2017-02-23 DIAGNOSIS — I5032 Chronic diastolic (congestive) heart failure: Secondary | ICD-10-CM | POA: Diagnosis not present

## 2017-02-23 DIAGNOSIS — I4891 Unspecified atrial fibrillation: Secondary | ICD-10-CM | POA: Diagnosis not present

## 2017-02-23 DIAGNOSIS — I110 Hypertensive heart disease with heart failure: Secondary | ICD-10-CM | POA: Diagnosis not present

## 2017-02-25 DIAGNOSIS — F0390 Unspecified dementia without behavioral disturbance: Secondary | ICD-10-CM | POA: Diagnosis not present

## 2017-02-25 DIAGNOSIS — G629 Polyneuropathy, unspecified: Secondary | ICD-10-CM | POA: Diagnosis not present

## 2017-02-25 DIAGNOSIS — I4891 Unspecified atrial fibrillation: Secondary | ICD-10-CM | POA: Diagnosis not present

## 2017-02-25 DIAGNOSIS — I5032 Chronic diastolic (congestive) heart failure: Secondary | ICD-10-CM | POA: Diagnosis not present

## 2017-02-25 DIAGNOSIS — I110 Hypertensive heart disease with heart failure: Secondary | ICD-10-CM | POA: Diagnosis not present

## 2017-02-25 DIAGNOSIS — J449 Chronic obstructive pulmonary disease, unspecified: Secondary | ICD-10-CM | POA: Diagnosis not present

## 2017-02-26 DIAGNOSIS — F0390 Unspecified dementia without behavioral disturbance: Secondary | ICD-10-CM | POA: Diagnosis not present

## 2017-02-26 DIAGNOSIS — I110 Hypertensive heart disease with heart failure: Secondary | ICD-10-CM | POA: Diagnosis not present

## 2017-02-26 DIAGNOSIS — I5032 Chronic diastolic (congestive) heart failure: Secondary | ICD-10-CM | POA: Diagnosis not present

## 2017-02-26 DIAGNOSIS — J449 Chronic obstructive pulmonary disease, unspecified: Secondary | ICD-10-CM | POA: Diagnosis not present

## 2017-02-26 DIAGNOSIS — I4891 Unspecified atrial fibrillation: Secondary | ICD-10-CM | POA: Diagnosis not present

## 2017-02-26 DIAGNOSIS — G629 Polyneuropathy, unspecified: Secondary | ICD-10-CM | POA: Diagnosis not present

## 2017-03-02 DIAGNOSIS — G629 Polyneuropathy, unspecified: Secondary | ICD-10-CM | POA: Diagnosis not present

## 2017-03-02 DIAGNOSIS — I5032 Chronic diastolic (congestive) heart failure: Secondary | ICD-10-CM | POA: Diagnosis not present

## 2017-03-02 DIAGNOSIS — J449 Chronic obstructive pulmonary disease, unspecified: Secondary | ICD-10-CM | POA: Diagnosis not present

## 2017-03-02 DIAGNOSIS — I110 Hypertensive heart disease with heart failure: Secondary | ICD-10-CM | POA: Diagnosis not present

## 2017-03-02 DIAGNOSIS — F0390 Unspecified dementia without behavioral disturbance: Secondary | ICD-10-CM | POA: Diagnosis not present

## 2017-03-02 DIAGNOSIS — I4891 Unspecified atrial fibrillation: Secondary | ICD-10-CM | POA: Diagnosis not present

## 2017-03-03 DIAGNOSIS — J449 Chronic obstructive pulmonary disease, unspecified: Secondary | ICD-10-CM | POA: Diagnosis not present

## 2017-03-03 DIAGNOSIS — I4891 Unspecified atrial fibrillation: Secondary | ICD-10-CM | POA: Diagnosis not present

## 2017-03-03 DIAGNOSIS — I5032 Chronic diastolic (congestive) heart failure: Secondary | ICD-10-CM | POA: Diagnosis not present

## 2017-03-03 DIAGNOSIS — G629 Polyneuropathy, unspecified: Secondary | ICD-10-CM | POA: Diagnosis not present

## 2017-03-03 DIAGNOSIS — I110 Hypertensive heart disease with heart failure: Secondary | ICD-10-CM | POA: Diagnosis not present

## 2017-03-03 DIAGNOSIS — F0390 Unspecified dementia without behavioral disturbance: Secondary | ICD-10-CM | POA: Diagnosis not present

## 2017-03-05 DIAGNOSIS — F0390 Unspecified dementia without behavioral disturbance: Secondary | ICD-10-CM | POA: Diagnosis not present

## 2017-03-05 DIAGNOSIS — J449 Chronic obstructive pulmonary disease, unspecified: Secondary | ICD-10-CM | POA: Diagnosis not present

## 2017-03-05 DIAGNOSIS — G629 Polyneuropathy, unspecified: Secondary | ICD-10-CM | POA: Diagnosis not present

## 2017-03-05 DIAGNOSIS — I5032 Chronic diastolic (congestive) heart failure: Secondary | ICD-10-CM | POA: Diagnosis not present

## 2017-03-05 DIAGNOSIS — I110 Hypertensive heart disease with heart failure: Secondary | ICD-10-CM | POA: Diagnosis not present

## 2017-03-05 DIAGNOSIS — I4891 Unspecified atrial fibrillation: Secondary | ICD-10-CM | POA: Diagnosis not present

## 2017-03-07 DIAGNOSIS — I5032 Chronic diastolic (congestive) heart failure: Secondary | ICD-10-CM | POA: Diagnosis not present

## 2017-03-07 DIAGNOSIS — J449 Chronic obstructive pulmonary disease, unspecified: Secondary | ICD-10-CM | POA: Diagnosis not present

## 2017-03-07 DIAGNOSIS — I4891 Unspecified atrial fibrillation: Secondary | ICD-10-CM | POA: Diagnosis not present

## 2017-03-07 DIAGNOSIS — I110 Hypertensive heart disease with heart failure: Secondary | ICD-10-CM | POA: Diagnosis not present

## 2017-03-07 DIAGNOSIS — G629 Polyneuropathy, unspecified: Secondary | ICD-10-CM | POA: Diagnosis not present

## 2017-03-07 DIAGNOSIS — F0390 Unspecified dementia without behavioral disturbance: Secondary | ICD-10-CM | POA: Diagnosis not present

## 2017-03-09 DIAGNOSIS — J449 Chronic obstructive pulmonary disease, unspecified: Secondary | ICD-10-CM | POA: Diagnosis not present

## 2017-03-09 DIAGNOSIS — I110 Hypertensive heart disease with heart failure: Secondary | ICD-10-CM | POA: Diagnosis not present

## 2017-03-09 DIAGNOSIS — G629 Polyneuropathy, unspecified: Secondary | ICD-10-CM | POA: Diagnosis not present

## 2017-03-09 DIAGNOSIS — I4891 Unspecified atrial fibrillation: Secondary | ICD-10-CM | POA: Diagnosis not present

## 2017-03-09 DIAGNOSIS — I5032 Chronic diastolic (congestive) heart failure: Secondary | ICD-10-CM | POA: Diagnosis not present

## 2017-03-09 DIAGNOSIS — F0390 Unspecified dementia without behavioral disturbance: Secondary | ICD-10-CM | POA: Diagnosis not present

## 2017-03-10 DIAGNOSIS — F0390 Unspecified dementia without behavioral disturbance: Secondary | ICD-10-CM | POA: Diagnosis not present

## 2017-03-10 DIAGNOSIS — I4891 Unspecified atrial fibrillation: Secondary | ICD-10-CM | POA: Diagnosis not present

## 2017-03-10 DIAGNOSIS — I5032 Chronic diastolic (congestive) heart failure: Secondary | ICD-10-CM | POA: Diagnosis not present

## 2017-03-10 DIAGNOSIS — J449 Chronic obstructive pulmonary disease, unspecified: Secondary | ICD-10-CM | POA: Diagnosis not present

## 2017-03-10 DIAGNOSIS — G629 Polyneuropathy, unspecified: Secondary | ICD-10-CM | POA: Diagnosis not present

## 2017-03-10 DIAGNOSIS — I110 Hypertensive heart disease with heart failure: Secondary | ICD-10-CM | POA: Diagnosis not present

## 2017-03-11 DIAGNOSIS — I5032 Chronic diastolic (congestive) heart failure: Secondary | ICD-10-CM | POA: Diagnosis not present

## 2017-03-11 DIAGNOSIS — I4891 Unspecified atrial fibrillation: Secondary | ICD-10-CM | POA: Diagnosis not present

## 2017-03-11 DIAGNOSIS — G629 Polyneuropathy, unspecified: Secondary | ICD-10-CM | POA: Diagnosis not present

## 2017-03-11 DIAGNOSIS — F0390 Unspecified dementia without behavioral disturbance: Secondary | ICD-10-CM | POA: Diagnosis not present

## 2017-03-11 DIAGNOSIS — J449 Chronic obstructive pulmonary disease, unspecified: Secondary | ICD-10-CM | POA: Diagnosis not present

## 2017-03-11 DIAGNOSIS — I110 Hypertensive heart disease with heart failure: Secondary | ICD-10-CM | POA: Diagnosis not present

## 2017-03-12 DIAGNOSIS — I110 Hypertensive heart disease with heart failure: Secondary | ICD-10-CM | POA: Diagnosis not present

## 2017-03-12 DIAGNOSIS — I4891 Unspecified atrial fibrillation: Secondary | ICD-10-CM | POA: Diagnosis not present

## 2017-03-12 DIAGNOSIS — J449 Chronic obstructive pulmonary disease, unspecified: Secondary | ICD-10-CM | POA: Diagnosis not present

## 2017-03-12 DIAGNOSIS — F0390 Unspecified dementia without behavioral disturbance: Secondary | ICD-10-CM | POA: Diagnosis not present

## 2017-03-12 DIAGNOSIS — I5032 Chronic diastolic (congestive) heart failure: Secondary | ICD-10-CM | POA: Diagnosis not present

## 2017-03-12 DIAGNOSIS — G629 Polyneuropathy, unspecified: Secondary | ICD-10-CM | POA: Diagnosis not present

## 2017-03-17 DIAGNOSIS — I5032 Chronic diastolic (congestive) heart failure: Secondary | ICD-10-CM | POA: Diagnosis not present

## 2017-03-17 DIAGNOSIS — J449 Chronic obstructive pulmonary disease, unspecified: Secondary | ICD-10-CM | POA: Diagnosis not present

## 2017-03-17 DIAGNOSIS — I4891 Unspecified atrial fibrillation: Secondary | ICD-10-CM | POA: Diagnosis not present

## 2017-03-17 DIAGNOSIS — F0390 Unspecified dementia without behavioral disturbance: Secondary | ICD-10-CM | POA: Diagnosis not present

## 2017-03-17 DIAGNOSIS — I110 Hypertensive heart disease with heart failure: Secondary | ICD-10-CM | POA: Diagnosis not present

## 2017-03-17 DIAGNOSIS — G629 Polyneuropathy, unspecified: Secondary | ICD-10-CM | POA: Diagnosis not present

## 2017-03-19 DIAGNOSIS — I5032 Chronic diastolic (congestive) heart failure: Secondary | ICD-10-CM | POA: Diagnosis not present

## 2017-03-19 DIAGNOSIS — F0390 Unspecified dementia without behavioral disturbance: Secondary | ICD-10-CM | POA: Diagnosis not present

## 2017-03-19 DIAGNOSIS — J449 Chronic obstructive pulmonary disease, unspecified: Secondary | ICD-10-CM | POA: Diagnosis not present

## 2017-03-19 DIAGNOSIS — I110 Hypertensive heart disease with heart failure: Secondary | ICD-10-CM | POA: Diagnosis not present

## 2017-03-19 DIAGNOSIS — G629 Polyneuropathy, unspecified: Secondary | ICD-10-CM | POA: Diagnosis not present

## 2017-03-19 DIAGNOSIS — I4891 Unspecified atrial fibrillation: Secondary | ICD-10-CM | POA: Diagnosis not present

## 2017-03-24 DIAGNOSIS — I4891 Unspecified atrial fibrillation: Secondary | ICD-10-CM | POA: Diagnosis not present

## 2017-03-24 DIAGNOSIS — J449 Chronic obstructive pulmonary disease, unspecified: Secondary | ICD-10-CM | POA: Diagnosis not present

## 2017-03-24 DIAGNOSIS — G629 Polyneuropathy, unspecified: Secondary | ICD-10-CM | POA: Diagnosis not present

## 2017-03-24 DIAGNOSIS — I5032 Chronic diastolic (congestive) heart failure: Secondary | ICD-10-CM | POA: Diagnosis not present

## 2017-03-24 DIAGNOSIS — I110 Hypertensive heart disease with heart failure: Secondary | ICD-10-CM | POA: Diagnosis not present

## 2017-03-24 DIAGNOSIS — F0390 Unspecified dementia without behavioral disturbance: Secondary | ICD-10-CM | POA: Diagnosis not present

## 2017-03-25 DIAGNOSIS — G629 Polyneuropathy, unspecified: Secondary | ICD-10-CM | POA: Diagnosis not present

## 2017-03-25 DIAGNOSIS — I5032 Chronic diastolic (congestive) heart failure: Secondary | ICD-10-CM | POA: Diagnosis not present

## 2017-03-25 DIAGNOSIS — I110 Hypertensive heart disease with heart failure: Secondary | ICD-10-CM | POA: Diagnosis not present

## 2017-03-25 DIAGNOSIS — I4891 Unspecified atrial fibrillation: Secondary | ICD-10-CM | POA: Diagnosis not present

## 2017-03-25 DIAGNOSIS — F0390 Unspecified dementia without behavioral disturbance: Secondary | ICD-10-CM | POA: Diagnosis not present

## 2017-03-25 DIAGNOSIS — J449 Chronic obstructive pulmonary disease, unspecified: Secondary | ICD-10-CM | POA: Diagnosis not present

## 2017-03-26 DIAGNOSIS — F0390 Unspecified dementia without behavioral disturbance: Secondary | ICD-10-CM | POA: Diagnosis not present

## 2017-03-26 DIAGNOSIS — I5032 Chronic diastolic (congestive) heart failure: Secondary | ICD-10-CM | POA: Diagnosis not present

## 2017-03-26 DIAGNOSIS — J449 Chronic obstructive pulmonary disease, unspecified: Secondary | ICD-10-CM | POA: Diagnosis not present

## 2017-03-26 DIAGNOSIS — I110 Hypertensive heart disease with heart failure: Secondary | ICD-10-CM | POA: Diagnosis not present

## 2017-03-26 DIAGNOSIS — G629 Polyneuropathy, unspecified: Secondary | ICD-10-CM | POA: Diagnosis not present

## 2017-03-26 DIAGNOSIS — I4891 Unspecified atrial fibrillation: Secondary | ICD-10-CM | POA: Diagnosis not present

## 2017-04-01 DIAGNOSIS — J449 Chronic obstructive pulmonary disease, unspecified: Secondary | ICD-10-CM | POA: Diagnosis not present

## 2017-04-01 DIAGNOSIS — F0390 Unspecified dementia without behavioral disturbance: Secondary | ICD-10-CM | POA: Diagnosis not present

## 2017-04-01 DIAGNOSIS — G629 Polyneuropathy, unspecified: Secondary | ICD-10-CM | POA: Diagnosis not present

## 2017-04-01 DIAGNOSIS — I110 Hypertensive heart disease with heart failure: Secondary | ICD-10-CM | POA: Diagnosis not present

## 2017-04-01 DIAGNOSIS — I5032 Chronic diastolic (congestive) heart failure: Secondary | ICD-10-CM | POA: Diagnosis not present

## 2017-04-01 DIAGNOSIS — I4891 Unspecified atrial fibrillation: Secondary | ICD-10-CM | POA: Diagnosis not present

## 2017-04-06 DIAGNOSIS — I110 Hypertensive heart disease with heart failure: Secondary | ICD-10-CM | POA: Diagnosis not present

## 2017-04-06 DIAGNOSIS — Z9181 History of falling: Secondary | ICD-10-CM | POA: Diagnosis not present

## 2017-04-06 DIAGNOSIS — D649 Anemia, unspecified: Secondary | ICD-10-CM | POA: Diagnosis not present

## 2017-04-06 DIAGNOSIS — Z7982 Long term (current) use of aspirin: Secondary | ICD-10-CM | POA: Diagnosis not present

## 2017-04-06 DIAGNOSIS — K219 Gastro-esophageal reflux disease without esophagitis: Secondary | ICD-10-CM | POA: Diagnosis not present

## 2017-04-06 DIAGNOSIS — I4891 Unspecified atrial fibrillation: Secondary | ICD-10-CM | POA: Diagnosis not present

## 2017-04-06 DIAGNOSIS — J449 Chronic obstructive pulmonary disease, unspecified: Secondary | ICD-10-CM | POA: Diagnosis not present

## 2017-04-06 DIAGNOSIS — G4733 Obstructive sleep apnea (adult) (pediatric): Secondary | ICD-10-CM | POA: Diagnosis not present

## 2017-04-06 DIAGNOSIS — F0390 Unspecified dementia without behavioral disturbance: Secondary | ICD-10-CM | POA: Diagnosis not present

## 2017-04-06 DIAGNOSIS — I5032 Chronic diastolic (congestive) heart failure: Secondary | ICD-10-CM | POA: Diagnosis not present

## 2017-04-06 DIAGNOSIS — G629 Polyneuropathy, unspecified: Secondary | ICD-10-CM | POA: Diagnosis not present

## 2017-04-07 ENCOUNTER — Other Ambulatory Visit (INDEPENDENT_AMBULATORY_CARE_PROVIDER_SITE_OTHER): Payer: Medicare Other

## 2017-04-07 ENCOUNTER — Ambulatory Visit (INDEPENDENT_AMBULATORY_CARE_PROVIDER_SITE_OTHER): Payer: Medicare Other | Admitting: Adult Health

## 2017-04-07 ENCOUNTER — Ambulatory Visit (INDEPENDENT_AMBULATORY_CARE_PROVIDER_SITE_OTHER)
Admission: RE | Admit: 2017-04-07 | Discharge: 2017-04-07 | Disposition: A | Discharge status: Home / Self Care | Payer: Medicare Other | Source: Ambulatory Visit | Attending: Adult Health | Admitting: Adult Health

## 2017-04-07 ENCOUNTER — Other Ambulatory Visit: Payer: Medicare Other

## 2017-04-07 ENCOUNTER — Encounter: Payer: Self-pay | Admitting: Adult Health

## 2017-04-07 VITALS — BP 116/62 | HR 69 | Ht 68.0 in | Wt 239.6 lb

## 2017-04-07 DIAGNOSIS — J449 Chronic obstructive pulmonary disease, unspecified: Secondary | ICD-10-CM

## 2017-04-07 DIAGNOSIS — Z23 Encounter for immunization: Secondary | ICD-10-CM | POA: Diagnosis not present

## 2017-04-07 DIAGNOSIS — I5032 Chronic diastolic (congestive) heart failure: Secondary | ICD-10-CM

## 2017-04-07 DIAGNOSIS — R05 Cough: Secondary | ICD-10-CM | POA: Diagnosis not present

## 2017-04-07 DIAGNOSIS — R0602 Shortness of breath: Secondary | ICD-10-CM

## 2017-04-07 DIAGNOSIS — G4733 Obstructive sleep apnea (adult) (pediatric): Secondary | ICD-10-CM

## 2017-04-07 LAB — BRAIN NATRIURETIC PEPTIDE: Pro B Natriuretic peptide (BNP): 230 pg/mL — ABNORMAL HIGH (ref 0.0–100.0)

## 2017-04-07 LAB — BASIC METABOLIC PANEL
BUN: 28 mg/dL — AB (ref 6–23)
CALCIUM: 9.7 mg/dL (ref 8.4–10.5)
CO2: 31 meq/L (ref 19–32)
Chloride: 92 mEq/L — ABNORMAL LOW (ref 96–112)
Creatinine, Ser: 0.86 mg/dL (ref 0.40–1.50)
GFR: 89.96 mL/min (ref >60.00)
GLUCOSE: 96 mg/dL (ref 70–99)
POTASSIUM: 3.4 meq/L — AB (ref 3.5–5.1)
Sodium: 133 mEq/L — ABNORMAL LOW (ref 135–145)

## 2017-04-07 NOTE — Assessment & Plan Note (Signed)
Clinically appears stable w/out flare  Check cxr  Flu shot today   Plan  Patient Instructions  Chest xray today  Labs today  Continue on Symbicort and Spriiva  Continue on CPAP At bedtime  At bedtime with oxygen .  Continue on Lasix and Zaroxyln as directed.  Follow up with Dr. Lamonte Sakai  In 3 months and As needed   Please contact office for sooner follow up if symptoms do not improve or worsen or seek emergency care  Flu shot today

## 2017-04-07 NOTE — Patient Instructions (Addendum)
Chest xray today  Labs today  Continue on Symbicort and Spriiva  Continue on CPAP At bedtime  At bedtime with oxygen .  Continue on Lasix and Zaroxyln as directed.  Follow up with Dr. Lamonte Sakai  In 3 months and As needed   Please contact office for sooner follow up if symptoms do not improve or worsen or seek emergency care  Flu shot today

## 2017-04-07 NOTE — Assessment & Plan Note (Signed)
Cont on CPAP with O2 .

## 2017-04-07 NOTE — Progress Notes (Signed)
@Patient  ID: Richard Rosales, male    DOB: 08/25/1930, 83 y.o.   MRN: [id number]  Chief Complaint  Patient presents with  . Follow-up    COPD , OSA     Referring provider: Unk Pinto, MD  HPI: 83 yo male former smoker followed for COPD , OSA on CPAP and Chronic Hypoxic Respiratory Failure on O2   TEST  PFTs 5/11  Showed moderate airway obstruction with FEv1 55% & good BD respnse - improved to 67% Echo 02/2014  EF 50-55%, , LA mod to severely dilated, RV mod dilated, RA mod to severe dialted.   04/07/2017  Acute OV : COPD  Pt presents for check up for COPD . He has known moderate COPD , Diastolic CHF and OSA on CPAP At bedtime  . Says that home health nurse noticed that his legs were swollen this week and noted some wheezing on exam. He says he feels the same as always. Legs are always swollen . Weight is stable at home. No increased edema or wt gain. No increased dyspnea. Remains weak, uses wheelchair mostly . No increased cough or  Congestion . No fever.  He remains on Symbicort and Spiriva . Uses lasix 40mg  and Zaroxyln 2.5 mg daily.  Wants flu shot today .  Uses CPAP each night with oxygen . Feels rested. No issues.      Allergies  Allergen Reactions  . Ace Inhibitors Cough  . Beta Adrenergic Blockers Other (See Comments)    bradycardia  . Decadron [Dexamethasone]     unknown  . Fluticasone-Salmeterol     Doesn't remember  . Hytrin [Terazosin]     Nasal congestion   . Prednisone     Shakes / tremors  . Vioxx [Rofecoxib] Other (See Comments)    dyspepsia    Immunization History  Administered Date(s) Administered  . DT 07/26/2014  . DTaP 06/24/2003  . Influenza Split 04/24/2011, 03/23/2012, 03/10/2013  . Influenza Whole 03/23/2009, 03/23/2010  . Influenza, High Dose Seasonal PF 04/13/2014, 03/03/2016, 04/07/2017  . Influenza,inj,Quad PF,6+ Mos 03/01/2015  . Pneumococcal Polysaccharide-23 05/23/1994, 11/22/2001, 05/28/2010  . Tdap 06/24/2003  . Zoster  09/14/2013    Past Medical History:  Diagnosis Date  . Acute bronchitis   . Adenomatous colon polyp   . Allergic rhinitis   . Anemia    iron deficient  . Atrial fibrillation ([place])   . Atrial flutter ([place])   . Chronic gastritis   . COPD (chronic obstructive pulmonary disease) ([place])   . Diastolic dysfunction   . Diverticulosis of colon    recurrent GI bleeding  . DJD (degenerative joint disease)   . GERD (gastroesophageal reflux disease)   . GI bleed   . HTN (hypertension)   . Hyperplastic colon polyp 2003  . Hypothyroidism   . Moderate aortic insufficiency   . OSA (obstructive sleep apnea)    compliants w/ CPAP  . Positive PPD    remote  . Shortness of breath   . Venous insufficiency     Tobacco History: History  Smoking Status  . Former Smoker  . Packs/day: 1.00  . Years: 20.00  . Types: Cigarettes  . Quit date: 08/01/1978  Smokeless Tobacco  . Never Used   Counseling given: Not Answered   Outpatient Encounter Prescriptions as of 04/07/2017  Medication Sig  . acetaminophen-codeine (TYLENOL #3) 300-30 MG tablet Take 1 tablet by mouth every 4 (four) hours as needed for moderate pain.  Marland Kitchen albuterol (PROVENTIL HFA;VENTOLIN HFA) 108 (  90 Base) MCG/ACT inhaler Inhale 2 puffs into the lungs every 6 (six) hours as needed for wheezing or shortness of breath.  Marland Kitchen albuterol (PROVENTIL) (2.5 MG/3ML) 0.083% nebulizer solution Take 3 mLs (2.5 mg total) by nebulization every 4 (four) hours.  Marland Kitchen amoxicillin (AMOXIL) 500 MG capsule as directed. (DENTAL)  . aspirin 81 MG tablet Take 1 tablet 3 days per week  . azelastine (ASTELIN) 0.1 % nasal spray USE 1 SPRAY IN EACH NOSTRIL TWICE A DAY  . b complex vitamins tablet Take 1 tablet by mouth every morning.   . chlorpheniramine (CHLOR-TRIMETON) 4 MG tablet Take 1 tablet by mouth in the morning and 1 tablet at bedtime  . Cholecalciferol (VITAMIN D) 2000 UNITS CAPS Take 2 capsules by mouth daily.   Marland Kitchen DILT-XR 240 MG 24 hr capsule TAKE 1  CAPSULE TWICE A DAY  . docusate sodium (COLACE) 100 MG capsule Take 100 mg by mouth 2 (two) times daily.    . fish oil-omega-3 fatty acids 1000 MG capsule Take 1 g by mouth 2 (two) times daily.    . furosemide (LASIX) 40 MG tablet Take 40 mg by mouth daily.  Marland Kitchen glucosamine-chondroitin 500-400 MG tablet Take 1 tablet by mouth 1 day or 1 dose.  Marland Kitchen guaifenesin (HUMIBID E) 400 MG TABS Take 400 mg by mouth 2 (two) times daily.    Marland Kitchen HYDROcodone-acetaminophen (NORCO/VICODIN) 5-325 MG per tablet Take 1 tablet by mouth every 6 (six) hours as needed for moderate pain (Only takes during CT/MRI testing).   Marland Kitchen levothyroxine (SYNTHROID, LEVOTHROID) 100 MCG tablet Take 1 tablet (100 mcg total) by mouth daily before breakfast.  . losartan (COZAAR) 100 MG tablet TAKE 1 TABLET DAILY  . omeprazole (PRILOSEC) 40 MG capsule TAKE 1 CAPSULE TWICE A DAY  . SPIRIVA HANDIHALER 18 MCG inhalation capsule INHALE THE CONTENTS OF 1 CAPSULE DAILY  . SYMBICORT 160-4.5 MCG/ACT inhaler USE 2 INHALATIONS TWICE A DAY  . metolazone (ZAROXOLYN) 5 MG tablet Take 1 tablet daily as directed for fluid & swelling   No facility-administered encounter medications on file as of 04/07/2017.      Review of Systems  Constitutional:   No  weight loss, night sweats,  Fevers, chills,  +fatigue, or  lassitude.  HEENT:   No headaches,  Difficulty swallowing,  Tooth/dental problems, or  Sore throat,                No sneezing, itching, ear ache, nasal congestion, post nasal drip,   CV:  No chest pain,  Orthopnea, PND,  anasarca, dizziness, palpitations, syncope.   GI  No heartburn, indigestion, abdominal pain, nausea, vomiting, diarrhea, change in bowel habits, loss of appetite, bloody stools.   Resp:   No excess mucus, no productive cough,  No non-productive cough,  No coughing up of blood.  No change in color of mucus.  No wheezing.  No chest wall deformity  Skin: no rash or lesions.  GU: no dysuria, change in color of urine, no urgency  or frequency.  No flank pain, no hematuria   MS:  No joint pain or swelling.  No decreased range of motion.  No back pain.    Physical Exam  BP 116/62 (BP Location: Left Arm, Cuff Size: Normal)   Pulse 69   Ht 5\' 8"  (1.727 m)   Wt 239 lb 9.6 oz (108.7 kg)   SpO2 95%   BMI 36.43 kg/m   GEN: A/Ox3; pleasant , NAD , elderly , chronically ill  appearing on O2    HEENT:  [place]/AT,  EACs-clear, TMs-wnl, NOSE-clear, THROAT-clear, no lesions, no postnasal drip or exudate noted.   NECK:  Supple w/ fair ROM; no JVD; normal carotid impulses w/o bruits; no thyromegaly or nodules palpated; no lymphadenopathy.    RESP  Clear  P & A; w/o, wheezes/ rales/ or rhonchi. no accessory muscle use, no dullness to percussion  CARD:  RRR, no m/r/g, 1+ peripheral edema, pulses intact, no cyanosis or clubbing.  GI:   Soft & nt; nml bowel sounds; no organomegaly or masses detected.   Musco: Warm bil, no deformities or joint swelling noted.   Neuro: alert, no focal deficits noted.    Skin: Warm, no lesions or rashes    Lab Results:   Imaging: Dg Chest 2 View  Result Date: 04/07/2017 CLINICAL DATA:  Chronic cough and shortness of breath.  COPD. EXAM: CHEST  2 VIEW COMPARISON:  PA and lateral chest 06/03/2016 and 04/12/2016. FINDINGS: There is cardiomegaly without edema. No consolidative process, pneumothorax or effusion. Atherosclerosis noted. IMPRESSION: Cardiomegaly without acute disease. Atherosclerosis. Electronically Signed   By: Inge Rise [name]   On: 04/07/2017 16:36     Assessment & Plan:   COPD (chronic obstructive pulmonary disease) Clinically appears stable w/out flare  Check cxr  Flu shot today   Plan  Patient Instructions  Chest xray today  Labs today  Continue on Symbicort and Spriiva  Continue on CPAP At bedtime  At bedtime with oxygen .  Continue on Lasix and Zaroxyln as directed.  Follow up with Dr. Lamonte Sakai  In 3 months and As needed   Please contact office for sooner  follow up if symptoms do not improve or worsen or seek emergency care  Flu shot today     OSA and COPD overlap syndrome ([place]) Cont on CPAP with O2 .   Chronic diastolic heart failure Does not appear fluid overloaded.  Cont on diuretics  Check bmet and bnp .      Rexene Edison, NP 04/07/2017

## 2017-04-07 NOTE — Assessment & Plan Note (Signed)
Does not appear fluid overloaded.  Cont on diuretics  Check bmet and bnp .

## 2017-04-09 ENCOUNTER — Other Ambulatory Visit: Payer: Self-pay | Admitting: Adult Health

## 2017-04-09 MED ORDER — POTASSIUM CHLORIDE ER 10 MEQ PO TBCR: 10.0000 meq | EXTENDED_RELEASE_TABLET | Freq: Every day | ORAL | 0 refills | Status: AC

## 2017-04-09 NOTE — Progress Notes (Signed)
Called spoke with patient, advised of cxr results / recs as stated by TP.  Pt verbalized his understanding and denied any questions. 

## 2017-04-09 NOTE — Progress Notes (Signed)
Called spoke with patient, advised of lab results / recs as stated by TP.  Pt verbalized his understanding and denied any questions.  Orders only encounter created for the [facility].  Pt requested that I call Dr. Idell Pickles office for him for the 1 month follow up - spoke with Raquel Sarna at Dr. Idell Pickles office who will call pt to schedule appt.  Labs routed to Dr. Melford Aase.

## 2017-04-09 NOTE — Progress Notes (Signed)
Notes recorded by Rinaldo Ratel, CMA on 04/09/2017 at 1:57 PM EDT Called spoke with patient, advised of lab results / recs as stated by TP.  Pt verbalized his understanding and denied any questions.  Orders only encounter created for the [facility].  Pt requested that I call Dr. Idell Pickles office for him for the 1 month follow up - spoke with Raquel Sarna at Dr. Idell Pickles office who will call pt to schedule appt.  Labs routed to Dr. Melford Aase.  ------  Notes recorded by Melvenia Needles, NP on 04/08/2017 at 8:51 AM EDT CHF marker is stable , lower than 5 yr ago,  Kidney fxn is ok .  K + is slighly low . Would begin K Dur 70meq 1 daily . #30 , no refills. Follow with PCP to see if needed to continue after 1 month.  Please contact office for sooner follow up if symptoms do not improve or worsen or seek emergency care

## 2017-04-15 ENCOUNTER — Ambulatory Visit: Payer: Medicare Other | Admitting: Podiatry

## 2017-04-15 DIAGNOSIS — J449 Chronic obstructive pulmonary disease, unspecified: Secondary | ICD-10-CM | POA: Diagnosis not present

## 2017-04-15 DIAGNOSIS — F0390 Unspecified dementia without behavioral disturbance: Secondary | ICD-10-CM | POA: Diagnosis not present

## 2017-04-15 DIAGNOSIS — I110 Hypertensive heart disease with heart failure: Secondary | ICD-10-CM | POA: Diagnosis not present

## 2017-04-15 DIAGNOSIS — I4891 Unspecified atrial fibrillation: Secondary | ICD-10-CM | POA: Diagnosis not present

## 2017-04-15 DIAGNOSIS — I5032 Chronic diastolic (congestive) heart failure: Secondary | ICD-10-CM | POA: Diagnosis not present

## 2017-04-15 DIAGNOSIS — G629 Polyneuropathy, unspecified: Secondary | ICD-10-CM | POA: Diagnosis not present

## 2017-04-16 ENCOUNTER — Ambulatory Visit: Payer: Self-pay | Admitting: Internal Medicine

## 2017-04-16 ENCOUNTER — Ambulatory Visit: Payer: Self-pay | Admitting: Physician Assistant

## 2017-04-20 DIAGNOSIS — I110 Hypertensive heart disease with heart failure: Secondary | ICD-10-CM | POA: Diagnosis not present

## 2017-04-20 DIAGNOSIS — I5032 Chronic diastolic (congestive) heart failure: Secondary | ICD-10-CM | POA: Diagnosis not present

## 2017-04-20 DIAGNOSIS — J449 Chronic obstructive pulmonary disease, unspecified: Secondary | ICD-10-CM | POA: Diagnosis not present

## 2017-04-20 DIAGNOSIS — I4891 Unspecified atrial fibrillation: Secondary | ICD-10-CM | POA: Diagnosis not present

## 2017-04-20 DIAGNOSIS — G629 Polyneuropathy, unspecified: Secondary | ICD-10-CM | POA: Diagnosis not present

## 2017-04-20 DIAGNOSIS — F0390 Unspecified dementia without behavioral disturbance: Secondary | ICD-10-CM | POA: Diagnosis not present

## 2017-04-21 ENCOUNTER — Ambulatory Visit (INDEPENDENT_AMBULATORY_CARE_PROVIDER_SITE_OTHER): Payer: Medicare Other | Admitting: Podiatry

## 2017-04-21 ENCOUNTER — Telehealth: Payer: Self-pay | Admitting: Internal Medicine

## 2017-04-21 DIAGNOSIS — M79676 Pain in unspecified toe(s): Secondary | ICD-10-CM | POA: Diagnosis not present

## 2017-04-21 DIAGNOSIS — R601 Generalized edema: Secondary | ICD-10-CM

## 2017-04-21 DIAGNOSIS — B351 Tinea unguium: Secondary | ICD-10-CM

## 2017-04-21 NOTE — Progress Notes (Signed)
Patient ID: Richard Rosales, male   DOB: 1930/12/20, 83 y.o.   MRN: [id number] Complaint:  Visit Type: Patient returns to my office for continued preventative foot care services. Complaint: Patient states" my nails have grown long and thick and become painful to walk and wear shoes".  He also has metal appearing  on the tip of his big toe right foot. Patient has been diagnosed with DM with no complications. He presents for preventative foot care services. No changes to ROS  Podiatric Exam: Vascular: dorsalis pedis and posterior tibial pulses are absent bilateral. Capillary return is immediate. Temperature gradient is decreased.. Skin turgor WNL  Sensorium: Normal Semmes Weinstein monofilament test. Normal tactile sensation bilaterally. Nail Exam: Pt has thick disfigured discolored nails with subungual debris noted bilateral entire nail hallux through fifth toenails.   Ulcer Exam: There is no evidence of ulcer or pre-ulcerative changes or infection. Orthopedic Exam: Muscle tone and strength are WNL. No limitations in general ROM. No crepitus or effusions noted. Foot type and digits show no abnormalities. Bony prominences are unremarkable. Skin:  Asymptomatic porokeratosis present at the distal aspect right hallux.. No infection or ulcers  Diagnosis:  Tinea unguium, Pain in right toe, pain in left toe.   Treatment & Plan Procedures and Treatment: Consent by patient was obtained for treatment procedures. The patient understood the discussion of treatment and procedures well. All questions were answered thoroughly reviewed. Debridement of mycotic and hypertrophic toenails, 1 through 5 bilateral and clearing of subungual debris. No ulceration, no infection noted.   Return Visit-Office Procedure: Patient instructed to return to the office for a follow up visit 3 months for continued evaluation and treatment.  Gardiner Barefoot DPM

## 2017-04-21 NOTE — Telephone Encounter (Signed)
New Message    Lobbyist with [facility] callled to state patient is having increased edema in legs, unable to do daily weights, lungs are congested, and only taking lasix once a day. Wants to see if Dr. Rayann Heman wants to adjust or keep as is. Requesting call back

## 2017-04-21 NOTE — Telephone Encounter (Signed)
Seen yesterday by Normajean Baxter, and she reports patient having increased swelling in both legs and feet. Currently taking furosemide 40 mg & metolazone 5 mg by mouth daily. Unable to weigh due to unsteady gait & patient is afraid of falling. Alexis reports congestion, sob, and weakness at baseline, no worsening symptoms. Ubaldo Glassing is asking for orders for the CHF protocol through [facility]. Home health ordered by PCP and home health nurse advised to contact his office as well. . No chest pain, or dizziness, n/v. Please advise if diuretic medication can be adjusted or if patient need an OV with an extender?

## 2017-04-21 NOTE — Telephone Encounter (Signed)
F/u message  Ubaldo Glassing returning Rn call .please call back to discuss

## 2017-04-22 ENCOUNTER — Ambulatory Visit: Payer: Medicare Other | Admitting: Emergency Medicine

## 2017-04-22 DIAGNOSIS — J449 Chronic obstructive pulmonary disease, unspecified: Secondary | ICD-10-CM | POA: Diagnosis not present

## 2017-04-22 DIAGNOSIS — G629 Polyneuropathy, unspecified: Secondary | ICD-10-CM | POA: Diagnosis not present

## 2017-04-22 DIAGNOSIS — I4891 Unspecified atrial fibrillation: Secondary | ICD-10-CM | POA: Diagnosis not present

## 2017-04-22 DIAGNOSIS — I5032 Chronic diastolic (congestive) heart failure: Secondary | ICD-10-CM | POA: Diagnosis not present

## 2017-04-22 DIAGNOSIS — I110 Hypertensive heart disease with heart failure: Secondary | ICD-10-CM | POA: Diagnosis not present

## 2017-04-22 DIAGNOSIS — F0390 Unspecified dementia without behavioral disturbance: Secondary | ICD-10-CM | POA: Diagnosis not present

## 2017-04-23 ENCOUNTER — Ambulatory Visit: Payer: Self-pay | Admitting: Internal Medicine

## 2017-04-25 ENCOUNTER — Other Ambulatory Visit: Payer: Self-pay | Admitting: Physician Assistant

## 2017-04-25 ENCOUNTER — Other Ambulatory Visit: Payer: Self-pay | Admitting: Internal Medicine

## 2017-04-25 DIAGNOSIS — I872 Venous insufficiency (chronic) (peripheral): Secondary | ICD-10-CM

## 2017-04-25 DIAGNOSIS — R601 Generalized edema: Secondary | ICD-10-CM

## 2017-04-25 DIAGNOSIS — I10 Essential (primary) hypertension: Secondary | ICD-10-CM

## 2017-04-26 NOTE — Telephone Encounter (Signed)
Please schedule follow-up with general cardiology APP

## 2017-04-27 NOTE — Telephone Encounter (Signed)
Richard Rosales and patient's wife informed and verbalized understanding. Appt offered for 05/04/17 but wife declined, requesting an afternoon appt. First available given for 05/18/17 @2 :30pm.

## 2017-04-29 DIAGNOSIS — G629 Polyneuropathy, unspecified: Secondary | ICD-10-CM | POA: Diagnosis not present

## 2017-04-29 DIAGNOSIS — F0390 Unspecified dementia without behavioral disturbance: Secondary | ICD-10-CM | POA: Diagnosis not present

## 2017-04-29 DIAGNOSIS — I110 Hypertensive heart disease with heart failure: Secondary | ICD-10-CM | POA: Diagnosis not present

## 2017-04-29 DIAGNOSIS — I4891 Unspecified atrial fibrillation: Secondary | ICD-10-CM | POA: Diagnosis not present

## 2017-04-29 DIAGNOSIS — J449 Chronic obstructive pulmonary disease, unspecified: Secondary | ICD-10-CM | POA: Diagnosis not present

## 2017-04-29 DIAGNOSIS — I5032 Chronic diastolic (congestive) heart failure: Secondary | ICD-10-CM | POA: Diagnosis not present

## 2017-05-04 ENCOUNTER — Encounter: Payer: Self-pay | Admitting: Cardiovascular Disease

## 2017-05-05 DIAGNOSIS — G629 Polyneuropathy, unspecified: Secondary | ICD-10-CM | POA: Diagnosis not present

## 2017-05-05 DIAGNOSIS — I4891 Unspecified atrial fibrillation: Secondary | ICD-10-CM | POA: Diagnosis not present

## 2017-05-05 DIAGNOSIS — I110 Hypertensive heart disease with heart failure: Secondary | ICD-10-CM | POA: Diagnosis not present

## 2017-05-05 DIAGNOSIS — I5032 Chronic diastolic (congestive) heart failure: Secondary | ICD-10-CM | POA: Diagnosis not present

## 2017-05-05 DIAGNOSIS — J449 Chronic obstructive pulmonary disease, unspecified: Secondary | ICD-10-CM | POA: Diagnosis not present

## 2017-05-05 DIAGNOSIS — F0390 Unspecified dementia without behavioral disturbance: Secondary | ICD-10-CM | POA: Diagnosis not present

## 2017-05-07 ENCOUNTER — Ambulatory Visit (INDEPENDENT_AMBULATORY_CARE_PROVIDER_SITE_OTHER): Payer: Medicare Other | Admitting: Internal Medicine

## 2017-05-07 ENCOUNTER — Encounter: Payer: Self-pay | Admitting: Internal Medicine

## 2017-05-07 VITALS — BP 118/76 | HR 59 | Temp 97.5°F | Resp 16 | Ht 68.0 in | Wt 242.8 lb

## 2017-05-07 DIAGNOSIS — E559 Vitamin D deficiency, unspecified: Secondary | ICD-10-CM

## 2017-05-07 DIAGNOSIS — E039 Hypothyroidism, unspecified: Secondary | ICD-10-CM

## 2017-05-07 DIAGNOSIS — R5383 Other fatigue: Secondary | ICD-10-CM

## 2017-05-07 DIAGNOSIS — E782 Mixed hyperlipidemia: Secondary | ICD-10-CM

## 2017-05-07 DIAGNOSIS — I482 Chronic atrial fibrillation, unspecified: Secondary | ICD-10-CM

## 2017-05-07 DIAGNOSIS — Z79899 Other long term (current) drug therapy: Secondary | ICD-10-CM

## 2017-05-07 DIAGNOSIS — R7303 Prediabetes: Secondary | ICD-10-CM | POA: Diagnosis not present

## 2017-05-07 DIAGNOSIS — I10 Essential (primary) hypertension: Secondary | ICD-10-CM | POA: Diagnosis not present

## 2017-05-07 NOTE — Patient Instructions (Signed)

## 2017-05-07 NOTE — Progress Notes (Signed)
This very nice 83 y.o. MWM presents for 6 month follow up with Hypertension, ASHD/cAfib, T2_NIDDM, severe COPD/Pulm HTN, OSA/CPAPO,  Hyperlipidemia, Pre-Diabetes and Vitamin D Deficiency. Patient is minimally ambulatory and is mostly wheelchair dependent.      Patient is treated for HTN circa 1998 & BP has been controlled at home. Today's BP is at goal - 118/76. Patient has cAfib and is not on Anticoagulant because of life threatening lower GI Bleeding on Coumadin. Patient also has a Chronic Diastolic CHF with severe chronic venostasis Disease.  Patient has had no complaints of any cardiac type chest pain, palpitations, orthopnea / PND, dizziness, claudication, but does have Dyspnea withy even mild activities as walking short distances.       Hyperlipidemia is controlled with diet & meds. Patient denies myalgias or other med SE's. Last Lipids were  Lab Results  Component Value Date   CHOL 159 05/07/2017   HDL 63 05/07/2017   LDLCALC 56 10/16/2016   TRIG 100 05/07/2017   CHOLHDL 2.5 05/07/2017      Also, the patient has history of  Gluttony / Morbid Obesity (BMI 36+) and T2_NIDDM w/CKD2 (GFR 66) and has had no symptoms of reactive hypoglycemia, diabetic polys, paresthesias or visual blurring.  Last A1c was normal and at goal: Lab Results  Component Value Date   HGBA1C 5.2 10/16/2016      Patient was dx'd Hypothyroid in 2007 and has been on replacement since. Further, the patient also has history of Vitamin D Deficiency and supplements vitamin D without any suspected side-effects. Last vitamin D was at goal:  Lab Results  Component Value Date   VD25OH 62 10/16/2016   Current Outpatient Medications on File Prior to Visit  Medication Sig  . acetaminophen-codeine (TYLENOL #3) 300-30 MG tablet Take 1 tablet by mouth every 4 (four) hours as needed for moderate pain.  Marland Kitchen albuterol (PROVENTIL HFA;VENTOLIN HFA) 108 (90 Base) MCG/ACT inhaler Inhale 2 puffs into the lungs every 6 (six) hours as  needed for wheezing or shortness of breath.  Marland Kitchen albuterol (PROVENTIL) (2.5 MG/3ML) 0.083% nebulizer solution Take 3 mLs (2.5 mg total) by nebulization every 4 (four) hours.  Marland Kitchen aspirin 81 MG tablet Take 1 tablet 3 days per week  . azelastine (ASTELIN) 0.1 % nasal spray USE 1 SPRAY IN EACH NOSTRIL TWICE A DAY  . b complex vitamins tablet Take 1 tablet by mouth every morning.   . chlorpheniramine (CHLOR-TRIMETON) 4 MG tablet Take 1 tablet by mouth in the morning and 1 tablet at bedtime  . Cholecalciferol (VITAMIN D) 2000 UNITS CAPS Take 2 capsules by mouth daily.   Marland Kitchen DILT-XR 240 MG 24 hr capsule TAKE 1 CAPSULE TWICE A DAY  . docusate sodium (COLACE) 100 MG capsule Take 100 mg by mouth 2 (two) times daily.    . fish oil-omega-3 fatty acids 1000 MG capsule Take 1 g by mouth 2 (two) times daily.    . furosemide (LASIX) 40 MG tablet TAKE 1 TABLET TWICE A DAY FOR FLUID RETENTION  . glucosamine-chondroitin 500-400 MG tablet Take 1 tablet by mouth 1 day or 1 dose.  Marland Kitchen guaifenesin (HUMIBID E) 400 MG TABS Take 400 mg by mouth 2 (two) times daily.    Marland Kitchen HYDROcodone-acetaminophen (NORCO/VICODIN) 5-325 MG per tablet Take 1 tablet by mouth every 6 (six) hours as needed for moderate pain (Only takes during CT/MRI testing).   Marland Kitchen levothyroxine (SYNTHROID, LEVOTHROID) 100 MCG tablet Take 1 tablet (100 mcg total)  by mouth daily before breakfast.  . losartan (COZAAR) 100 MG tablet TAKE 1 TABLET DAILY  . metolazone (ZAROXOLYN) 5 MG tablet TAKE 1 TABLET DAILY AS DIRECTED FOR FLUID AND SWELLING  . omeprazole (PRILOSEC) 40 MG capsule TAKE 1 CAPSULE TWICE A DAY  . potassium chloride (K-DUR) 10 MEQ tablet Take 1 tablet (10 mEq total) by mouth daily.  Marland Kitchen SPIRIVA HANDIHALER 18 MCG inhalation capsule INHALE THE CONTENTS OF 1 CAPSULE DAILY  . SYMBICORT 160-4.5 MCG/ACT inhaler USE 2 INHALATIONS TWICE A DAY  . amoxicillin (AMOXIL) 500 MG capsule as directed. (DENTAL)   No current facility-administered medications on file prior to  visit.    Allergies  Allergen Reactions  . Ace Inhibitors Cough  . Beta Adrenergic Blockers Other (See Comments)    bradycardia  . Decadron [Dexamethasone]     unknown  . Fluticasone-Salmeterol     Doesn't remember  . Hytrin [Terazosin]     Nasal congestion   . Prednisone     Shakes / tremors  . Vioxx [Rofecoxib] Other (See Comments)    dyspepsia   PMHx:   Past Medical History:  Diagnosis Date  . Acute bronchitis   . Adenomatous colon polyp   . Allergic rhinitis   . Anemia    iron deficient  . Atrial fibrillation ([place])   . Atrial flutter ([place])   . Chronic gastritis   . COPD (chronic obstructive pulmonary disease) ([place])   . Diastolic dysfunction   . Diverticulosis of colon    recurrent GI bleeding  . DJD (degenerative joint disease)   . GERD (gastroesophageal reflux disease)   . GI bleed   . HTN (hypertension)   . Hyperplastic colon polyp 2003  . Hypothyroidism   . Moderate aortic insufficiency   . OSA (obstructive sleep apnea)    compliants w/ CPAP  . Positive PPD    remote  . Shortness of breath   . Venous insufficiency    Immunization History  Administered Date(s) Administered  . DT 07/26/2014  . DTaP 06/24/2003  . Influenza Split 04/24/2011, 03/23/2012, 03/10/2013  . Influenza Whole 03/23/2009, 03/23/2010  . Influenza, High Dose Seasonal PF 04/13/2014, 03/03/2016, 04/07/2017  . Influenza,inj,Quad PF,6+ Mos 03/01/2015  . Pneumococcal Polysaccharide-23 05/23/1994, 11/22/2001, 05/28/2010  . Tdap 06/24/2003  . Zoster 09/14/2013   Past Surgical History:  Procedure Laterality Date  . CARDIAC CATHETERIZATION  7.27.05   revealed a preserved EF with no CAD  . COLONOSCOPY    . HERNIA REPAIR Bilateral   . JOINT REPLACEMENT  2001   lt total knee-rt one done 00  . NASAL SINUS SURGERY    . POLYPECTOMY     adenomatous polyps 2010  . SHOULDER ARTHROSCOPY    . TOE SURGERY Right   . TONSILLECTOMY    . TOTAL KNEE ARTHROPLASTY     bilateral   FHx:     Reviewed / unchanged  SHx:    Reviewed / unchanged  Systems Review:  Constitutional: Denies fever, chills, wt changes, headaches, insomnia, fatigue, night sweats, change in appetite. Eyes: Denies redness, blurred vision, diplopia, discharge, itchy, watery eyes.  ENT: Denies discharge, congestion, post nasal drip, epistaxis, sore throat, earache, hearing loss, dental pain, tinnitus, vertigo, sinus pain, snoring.  CV: Denies chest pain, palpitations, irregular heartbeat, syncope, dyspnea, diaphoresis, orthopnea, PND, claudication or edema. Respiratory: denies cough, dyspnea, DOE, pleurisy, hoarseness, laryngitis, wheezing.  Gastrointestinal: Denies dysphagia, odynophagia, heartburn, reflux, water brash, abdominal pain or cramps, nausea, vomiting, bloating, diarrhea, constipation, hematemesis, melena, hematochezia  or hemorrhoids. Genitourinary: Denies dysuria, frequency, urgency, nocturia, hesitancy, discharge, hematuria or flank pain. Musculoskeletal: Denies arthralgias, myalgias, stiffness, jt. swelling, pain, limping or strain/sprain.  Skin: Denies pruritus, rash, hives, warts, acne, eczema or change in skin lesion(s). Neuro: No weakness, tremor, incoordination, spasms, paresthesia or pain. Psychiatric: Denies confusion, memory loss or sensory loss. Endo: Denies change in weight, skin or hair change.  Heme/Lymph: No excessive bleeding, bruising or enlarged lymph nodes.  Physical Exam  BP 118/76   Pulse (!) 59   Temp (!) 97.5 F (36.4 C)   Resp 16   Ht 5\' 8"  (1.727 m)   Wt 242 lb 12.8 oz (110.1 kg)   SpO2 93%   BMI 36.92 kg/m   Appears well nourished, well groomed  and in no distress.  Eyes: PERRLA, EOMs, conjunctiva no swelling or erythema. Sinuses: No frontal/maxillary tenderness ENT/Mouth: EAC's clear, TM's nl w/o erythema, bulging. Nares clear w/o erythema, swelling, exudates. Oropharynx clear without erythema or exudates. Oral hygiene is good. Tongue normal, non obstructing.  Hearing intact.  Neck: Supple. Thyroid nl. Car 2+/2+ without bruits, nodes or JVD. Chest: Respirations nl with BS clear & equal w/o rales, rhonchi, wheezing or stridor.  Cor: Heart sounds very softw/ slow irregular rate and rhythm without sig. murmurs. Peripheral pulses normal and PP obscured by 3+ chronic ankle/pedal edema.  Abdomen: Soft & bowel sounds normal. Non-tender w/o guarding, rebound, hernias, masses or organomegaly.  Lymphatics: Unremarkable.  Musculoskeletal: Full ROM all peripheral extremities, joint stability, 5/5 strength and non-ambulatory in a wheelchair.  Skin: Warm, dry without exposed rashes, lesions or ecchymosis apparent.  Neuro: Cranial nerves intact, reflexes equal bilaterally. Sensory-motor testing grossly intact. Tendon reflexes grossly intact.  Pysch: Alert & oriented x 3.  Insight and judgement nl & appropriate. No ideations.  Assessment and Plan:  1. Essential hypertension  - Continue medication, monitor blood pressure at home.  - Continue DASH diet. Reminder to go to the ER if any CP,  SOB, nausea, dizziness, severe HA, changes vision/speech.  - CBC with Differential/Platelet - BASIC METABOLIC PANEL WITH GFR - Magnesium - TSH  2. Hyperlipidemia, mixed  - Continue diet/meds, exercise,& lifestyle modifications.  - Continue monitor periodic cholesterol/liver & renal functions   - Hepatic function panel - Lipid panel - TSH  3. Prediabetes  - Continue diet, exercise, lifestyle modifications.  - Monitor appropriate labs.  - Hemoglobin A1c - Insulin, random  4. Vitamin D deficiency  - Continue supplementation.  - VITAMIN D 25 Hydroxy   5. Chronic atrial fibrillation (HCC)  - TSH  6. Hypothyroidism, unspecified type  - TSH  7. Fatigue, unspecified type  - CBC with Differential/Platelet - TSH  8. Medication management  - CBC with Differential/Platelet - BASIC METABOLIC PANEL WITH GFR - Hepatic function panel - Magnesium - Lipid  panel - TSH - Hemoglobin A1c - Insulin, random - VITAMIN D 25 Hydroxy         Discussed  regular exercise, BP monitoring, weight control to achieve/maintain BMI less than 25 and discussed med and SE's. Recommended labs to assess and monitor clinical status with further disposition pending results of labs. Over 30 minutes of exam, counseling, chart review was performed.

## 2017-05-08 LAB — HEPATIC FUNCTION PANEL
AG RATIO: 1.3 (calc) (ref 1.0–2.5)
ALT: 15 U/L (ref 9–46)
AST: 26 U/L (ref 10–35)
Albumin: 4 g/dL (ref 3.6–5.1)
Alkaline phosphatase (APISO): 107 U/L (ref 40–115)
BILIRUBIN DIRECT: 0.1 mg/dL (ref 0.0–0.2)
BILIRUBIN INDIRECT: 0.4 mg/dL (ref 0.2–1.2)
BILIRUBIN TOTAL: 0.5 mg/dL (ref 0.2–1.2)
Globulin: 3 g/dL (calc) (ref 1.9–3.7)
TOTAL PROTEIN: 7 g/dL (ref 6.1–8.1)

## 2017-05-08 LAB — LIPID PANEL
CHOL/HDL RATIO: 2.5 (calc) (ref <5.0)
Cholesterol: 159 mg/dL (ref <200)
HDL: 63 mg/dL (ref >40)
LDL Cholesterol (Calc): 77 mg/dL (calc)
NON-HDL CHOLESTEROL (CALC): 96 mg/dL (ref <130)
Triglycerides: 100 mg/dL (ref <150)

## 2017-05-08 LAB — BASIC METABOLIC PANEL WITH GFR
BUN: 25 mg/dL (ref 7–25)
CO2: 33 mmol/L — ABNORMAL HIGH (ref 20–32)
CREATININE: 0.92 mg/dL (ref 0.70–1.11)
Calcium: 9.5 mg/dL (ref 8.6–10.3)
Chloride: 91 mmol/L — ABNORMAL LOW (ref 98–110)
GFR, EST NON AFRICAN AMERICAN: 76 mL/min/{1.73_m2} (ref >=60)
GFR, Est African American: 88 mL/min/{1.73_m2} (ref >=60)
GLUCOSE: 89 mg/dL (ref 65–99)
Potassium: 3.8 mmol/L (ref 3.5–5.3)
SODIUM: 135 mmol/L (ref 135–146)

## 2017-05-08 LAB — HEMOGLOBIN A1C
EAG (MMOL/L): 6.2 (calc)
HEMOGLOBIN A1C: 5.5 %{Hb} (ref <5.7)
Mean Plasma Glucose: 111 (calc)

## 2017-05-08 LAB — CBC WITH DIFFERENTIAL/PLATELET
BASOS ABS: 89 {cells}/uL (ref 0–200)
Basophils Relative: 1 %
Eosinophils Absolute: 596 cells/uL — ABNORMAL HIGH (ref 15–500)
Eosinophils Relative: 6.7 %
HCT: 42.5 % (ref 38.5–50.0)
HEMOGLOBIN: 14.3 g/dL (ref 13.2–17.1)
Lymphs Abs: 2252 cells/uL (ref 850–3900)
MCH: 30.5 pg (ref 27.0–33.0)
MCHC: 33.6 g/dL (ref 32.0–36.0)
MCV: 90.6 fL (ref 80.0–100.0)
MONOS PCT: 11.7 %
MPV: 10.5 fL (ref 7.5–12.5)
Neutro Abs: 4922 cells/uL (ref 1500–7800)
Neutrophils Relative %: 55.3 %
Platelets: 229 10*3/uL (ref 140–400)
RBC: 4.69 10*6/uL (ref 4.20–5.80)
RDW: 11.6 % (ref 11.0–15.0)
TOTAL LYMPHOCYTE: 25.3 %
WBC mixed population: 1041 cells/uL — ABNORMAL HIGH (ref 200–950)
WBC: 8.9 10*3/uL (ref 3.8–10.8)

## 2017-05-08 LAB — TSH: TSH: 5.11 m[IU]/L — AB (ref 0.40–4.50)

## 2017-05-08 LAB — INSULIN, RANDOM: Insulin: 10.1 u[IU]/mL (ref 2.0–19.6)

## 2017-05-08 LAB — MAGNESIUM: Magnesium: 2.2 mg/dL (ref 1.5–2.5)

## 2017-05-08 LAB — VITAMIN D 25 HYDROXY (VIT D DEFICIENCY, FRACTURES): VIT D 25 HYDROXY: 62 ng/mL (ref 30–100)

## 2017-05-11 ENCOUNTER — Encounter: Payer: Self-pay | Admitting: *Deleted

## 2017-05-12 DIAGNOSIS — I110 Hypertensive heart disease with heart failure: Secondary | ICD-10-CM | POA: Diagnosis not present

## 2017-05-12 DIAGNOSIS — J449 Chronic obstructive pulmonary disease, unspecified: Secondary | ICD-10-CM | POA: Diagnosis not present

## 2017-05-12 DIAGNOSIS — I5032 Chronic diastolic (congestive) heart failure: Secondary | ICD-10-CM | POA: Diagnosis not present

## 2017-05-12 DIAGNOSIS — I4891 Unspecified atrial fibrillation: Secondary | ICD-10-CM | POA: Diagnosis not present

## 2017-05-12 DIAGNOSIS — G629 Polyneuropathy, unspecified: Secondary | ICD-10-CM | POA: Diagnosis not present

## 2017-05-12 DIAGNOSIS — F0390 Unspecified dementia without behavioral disturbance: Secondary | ICD-10-CM | POA: Diagnosis not present

## 2017-05-18 ENCOUNTER — Ambulatory Visit: Payer: Medicare Other | Admitting: Cardiology

## 2017-05-19 DIAGNOSIS — J449 Chronic obstructive pulmonary disease, unspecified: Secondary | ICD-10-CM | POA: Diagnosis not present

## 2017-05-19 DIAGNOSIS — F0390 Unspecified dementia without behavioral disturbance: Secondary | ICD-10-CM | POA: Diagnosis not present

## 2017-05-19 DIAGNOSIS — I4891 Unspecified atrial fibrillation: Secondary | ICD-10-CM | POA: Diagnosis not present

## 2017-05-19 DIAGNOSIS — I5032 Chronic diastolic (congestive) heart failure: Secondary | ICD-10-CM | POA: Diagnosis not present

## 2017-05-19 DIAGNOSIS — G629 Polyneuropathy, unspecified: Secondary | ICD-10-CM | POA: Diagnosis not present

## 2017-05-19 DIAGNOSIS — I110 Hypertensive heart disease with heart failure: Secondary | ICD-10-CM | POA: Diagnosis not present

## 2017-05-20 ENCOUNTER — Encounter: Payer: Self-pay | Admitting: Internal Medicine

## 2017-05-21 DIAGNOSIS — J449 Chronic obstructive pulmonary disease, unspecified: Secondary | ICD-10-CM | POA: Diagnosis not present

## 2017-05-21 DIAGNOSIS — I5032 Chronic diastolic (congestive) heart failure: Secondary | ICD-10-CM | POA: Diagnosis not present

## 2017-05-21 DIAGNOSIS — I110 Hypertensive heart disease with heart failure: Secondary | ICD-10-CM | POA: Diagnosis not present

## 2017-05-21 DIAGNOSIS — I4891 Unspecified atrial fibrillation: Secondary | ICD-10-CM | POA: Diagnosis not present

## 2017-05-21 DIAGNOSIS — F0390 Unspecified dementia without behavioral disturbance: Secondary | ICD-10-CM | POA: Diagnosis not present

## 2017-05-21 DIAGNOSIS — G629 Polyneuropathy, unspecified: Secondary | ICD-10-CM | POA: Diagnosis not present

## 2017-05-22 ENCOUNTER — Telehealth: Payer: Self-pay | Admitting: Internal Medicine

## 2017-05-22 NOTE — Telephone Encounter (Signed)
Richard Rosales ( [place]) is calling to report a low heart of 58 , heart beats are regular . Please call if you have any questions . Thanks

## 2017-05-22 NOTE — Telephone Encounter (Signed)
Called patient to follow-up on HR of 58 bpm. Informed him that historically, his HR has run in the upper 50s to 60s.  He reports he feels great- he has no symptoms.  He has no other vital signs to report. He is taking his medications as directed. Instructed him to call if his HR decreases and stays lower or if he gets lightheadedness or dizziness.  He was grateful for call and agrees with treatment plan.  He understands he will be called if Dr. Rayann Heman has further instructions.

## 2017-05-26 DIAGNOSIS — G629 Polyneuropathy, unspecified: Secondary | ICD-10-CM | POA: Diagnosis not present

## 2017-05-26 DIAGNOSIS — I110 Hypertensive heart disease with heart failure: Secondary | ICD-10-CM | POA: Diagnosis not present

## 2017-05-26 DIAGNOSIS — I4891 Unspecified atrial fibrillation: Secondary | ICD-10-CM | POA: Diagnosis not present

## 2017-05-26 DIAGNOSIS — F0390 Unspecified dementia without behavioral disturbance: Secondary | ICD-10-CM | POA: Diagnosis not present

## 2017-05-26 DIAGNOSIS — J449 Chronic obstructive pulmonary disease, unspecified: Secondary | ICD-10-CM | POA: Diagnosis not present

## 2017-05-26 DIAGNOSIS — I5032 Chronic diastolic (congestive) heart failure: Secondary | ICD-10-CM | POA: Diagnosis not present

## 2017-05-27 ENCOUNTER — Ambulatory Visit (INDEPENDENT_AMBULATORY_CARE_PROVIDER_SITE_OTHER): Payer: Medicare Other | Admitting: Internal Medicine

## 2017-05-27 ENCOUNTER — Encounter: Payer: Self-pay | Admitting: Internal Medicine

## 2017-05-27 VITALS — BP 124/74 | HR 49 | Ht 68.0 in | Wt 244.0 lb

## 2017-05-27 DIAGNOSIS — I482 Chronic atrial fibrillation, unspecified: Secondary | ICD-10-CM

## 2017-05-27 DIAGNOSIS — G4733 Obstructive sleep apnea (adult) (pediatric): Secondary | ICD-10-CM

## 2017-05-27 DIAGNOSIS — R601 Generalized edema: Secondary | ICD-10-CM | POA: Diagnosis not present

## 2017-05-27 DIAGNOSIS — I10 Essential (primary) hypertension: Secondary | ICD-10-CM

## 2017-05-27 MED ORDER — DILTIAZEM HCL ER 240 MG PO CP24: 240.0000 mg | ORAL_CAPSULE | Freq: Every day | ORAL | 3 refills | Status: DC

## 2017-05-27 NOTE — Progress Notes (Signed)
PCP: Unk Pinto, MD   Primary EP: Dr Sabas Sous is a 83 y.o. male who presents today for routine electrophysiology followup.  Since last being seen in our clinic, the patient reports doing very well.  Today, he denies symptoms of palpitations, chest pain, shortness of breath,  lower extremity edema, dizziness, presyncope, or syncope.  The patient is otherwise without complaint today.   Past Medical History:  Diagnosis Date  . Acute bronchitis   . Adenomatous colon polyp   . Allergic rhinitis   . Anemia    iron deficient  . Atrial fibrillation ([place])   . Atrial flutter ([place])   . Chronic gastritis   . COPD (chronic obstructive pulmonary disease) ([place])   . Diastolic dysfunction   . Diverticulosis of colon    recurrent GI bleeding  . DJD (degenerative joint disease)   . GERD (gastroesophageal reflux disease)   . GI bleed   . HTN (hypertension)   . Hyperplastic colon polyp 2003  . Hypothyroidism   . Moderate aortic insufficiency   . OSA (obstructive sleep apnea)    compliants w/ CPAP  . Positive PPD    remote  . Shortness of breath   . Venous insufficiency    Past Surgical History:  Procedure Laterality Date  . CARDIAC CATHETERIZATION  7.27.05   revealed a preserved EF with no CAD  . COLONOSCOPY    . HERNIA REPAIR Bilateral   . JOINT REPLACEMENT  2001   lt total knee-rt one done 00  . NASAL SINUS SURGERY    . POLYPECTOMY     adenomatous polyps 2010  . SHOULDER ARTHROSCOPY    . TOE SURGERY Right   . TONSILLECTOMY    . TOTAL KNEE ARTHROPLASTY     bilateral    ROS- all systems are reviewed and negatives except as per HPI above  Current Outpatient Medications  Medication Sig Dispense Refill  . acetaminophen-codeine (TYLENOL #3) 300-30 MG tablet Take 1 tablet by mouth every 4 (four) hours as needed for moderate pain. 180 tablet 3  . albuterol (PROVENTIL HFA;VENTOLIN HFA) 108 (90 Base) MCG/ACT inhaler Inhale 2 puffs into the lungs every 6 (six)  hours as needed for wheezing or shortness of breath. 1 Inhaler 2  . albuterol (PROVENTIL) (2.5 MG/3ML) 0.083% nebulizer solution Take 3 mLs (2.5 mg total) by nebulization every 4 (four) hours. 300 mL 11  . amoxicillin (AMOXIL) 500 MG capsule as directed. (DENTAL)    . aspirin 81 MG tablet Take 1 tablet 3 days per week    . azelastine (ASTELIN) 0.1 % nasal spray USE 1 SPRAY IN EACH NOSTRIL TWICE A DAY 90 mL 1  . b complex vitamins tablet Take 1 tablet by mouth every morning.     . chlorpheniramine (CHLOR-TRIMETON) 4 MG tablet Take 1 tablet by mouth in the morning and 1 tablet at bedtime    . Cholecalciferol (VITAMIN D) 2000 UNITS CAPS Take 2 capsules by mouth daily.     Marland Kitchen DILT-XR 240 MG 24 hr capsule TAKE 1 CAPSULE TWICE A DAY 180 capsule 3  . docusate sodium (COLACE) 100 MG capsule Take 100 mg by mouth 2 (two) times daily.      . fish oil-omega-3 fatty acids 1000 MG capsule Take 1 g by mouth 2 (two) times daily.      . furosemide (LASIX) 40 MG tablet TAKE 1 TABLET TWICE A DAY FOR FLUID RETENTION 180 tablet 1  . glucosamine-chondroitin 500-400 MG tablet  Take 1 tablet by mouth 1 day or 1 dose.    Marland Kitchen guaifenesin (HUMIBID E) 400 MG TABS Take 400 mg by mouth 2 (two) times daily.      Marland Kitchen HYDROcodone-acetaminophen (NORCO/VICODIN) 5-325 MG per tablet Take 1 tablet by mouth every 6 (six) hours as needed for moderate pain (Only takes during CT/MRI testing).     Marland Kitchen levothyroxine (SYNTHROID, LEVOTHROID) 100 MCG tablet Take 1 tablet (100 mcg total) by mouth daily before breakfast. 90 tablet 4  . losartan (COZAAR) 100 MG tablet TAKE 1 TABLET DAILY 90 tablet 1  . metolazone (ZAROXOLYN) 5 MG tablet TAKE 1 TABLET DAILY AS DIRECTED FOR FLUID AND SWELLING 90 tablet 1  . omeprazole (PRILOSEC) 40 MG capsule TAKE 1 CAPSULE TWICE A DAY 180 capsule 1  . potassium chloride (K-DUR) 10 MEQ tablet Take 1 tablet (10 mEq total) by mouth daily. 30 tablet 0  . SPIRIVA HANDIHALER 18 MCG inhalation capsule INHALE THE CONTENTS OF 1  CAPSULE DAILY 90 capsule 1  . SYMBICORT 160-4.5 MCG/ACT inhaler USE 2 INHALATIONS TWICE A DAY 30.6 g 3   No current facility-administered medications for this visit.     Physical Exam: Vitals:   05/27/17 1432  BP: 124/74  Pulse: (!) 49  SpO2: 94%  Weight: 244 lb (110.7 kg)  Height: 5\' 8"  (1.727 m)    GEN- The patient is well appearing, alert and oriented x 3 today.   Head- normocephalic, atraumatic Eyes-  Sclera clear, conjunctiva pink Ears- hearing intact Oropharynx- clear Lungs- diffuse expiratory wheezes Heart- bradycardic irregular rhythm GI- soft, NT, ND, + BS Extremities- no clubbing, cyanosis, + stable edema with support hose in place  EKG tracing ordered today is personally reviewed and shows afib, V rate 49 bpm, RBBB, LAHB  Assessment and Plan:  1. Permanent atrial fibrillation Declines anticoagulation Rate controlled  2. Hypertensive cardiovascular disease Stable No change required today  3. Bradycardia Asymptomatic Reduce diltiazem to 240mg  daily (from twice daily).  Can be further reduced if bradycardia persists.  4. AI Previously noted Not a surgical candidate and does not wish to have repeat echo  5. OSA Stable Compliant with CPAP  6. Venous insufficiency Stable No change required today    Thompson Grayer MD, [facility] 05/27/2017 2:42 PM

## 2017-05-27 NOTE — Patient Instructions (Signed)
Medication Instructions:  Your physician has recommended you make the following change in your medication:  1) Decrease Diltiazem 240 mg daily   Labwork: None ordered   Testing/Procedures: None ordered   Follow-Up: Your physician wants you to follow-up in: 12 months with Dr Vallery Ridge will receive a reminder letter in the mail two months in advance. If you don't receive a letter, please call our office to schedule the follow-up appointment.   Any Other Special Instructions Will Be Listed Below (If Applicable).     If you need a refill on your cardiac medications before your next appointment, please call your pharmacy.

## 2017-05-31 ENCOUNTER — Other Ambulatory Visit: Payer: Self-pay | Admitting: Internal Medicine

## 2017-06-03 ENCOUNTER — Ambulatory Visit: Payer: Medicare Other | Admitting: Neurology

## 2017-06-04 ENCOUNTER — Encounter: Payer: Self-pay | Admitting: Neurology

## 2017-06-04 DIAGNOSIS — G629 Polyneuropathy, unspecified: Secondary | ICD-10-CM | POA: Diagnosis not present

## 2017-06-04 DIAGNOSIS — I5032 Chronic diastolic (congestive) heart failure: Secondary | ICD-10-CM | POA: Diagnosis not present

## 2017-06-04 DIAGNOSIS — I4891 Unspecified atrial fibrillation: Secondary | ICD-10-CM | POA: Diagnosis not present

## 2017-06-04 DIAGNOSIS — I110 Hypertensive heart disease with heart failure: Secondary | ICD-10-CM | POA: Diagnosis not present

## 2017-06-04 DIAGNOSIS — F0390 Unspecified dementia without behavioral disturbance: Secondary | ICD-10-CM | POA: Diagnosis not present

## 2017-06-04 DIAGNOSIS — J449 Chronic obstructive pulmonary disease, unspecified: Secondary | ICD-10-CM | POA: Diagnosis not present

## 2017-06-08 ENCOUNTER — Telehealth: Payer: Self-pay | Admitting: Internal Medicine

## 2017-06-08 NOTE — Telephone Encounter (Signed)
Called and spoke to wife.  He was napping.  She asked me to call him back tomorrow.

## 2017-06-08 NOTE — Telephone Encounter (Signed)
New message   Patient calling with concerns of increased HR. Patient states since dosage change of  Diltiazem he feels HR too high. Please call  Pt c/o medication issue:  1. Name of Medication: diltiazem (DILT-XR) 240 MG 24 hr capsule 2. How are you currently taking this medication (dosage and times per day)? As prescribed  3. Are you having a reaction (difficulty breathing--STAT)? No  4. What is your medication issue? Patient states HR has been high, felt anxious.

## 2017-06-10 NOTE — Telephone Encounter (Signed)
Returned call to patient again and did not get an answer.

## 2017-06-12 NOTE — Telephone Encounter (Addendum)
Was able to finally reach patient and discuss his phone call.  At last OV on 12/5 his Cardizem dose was decreased to 240 mg daily from 240 mg bid.  He has had to go back to twice daily due to his HR being too high.

## 2017-06-17 ENCOUNTER — Other Ambulatory Visit: Payer: Self-pay | Admitting: Internal Medicine

## 2017-07-08 MED ORDER — DILTIAZEM HCL ER 240 MG PO CP24: 240.0000 mg | ORAL_CAPSULE | Freq: Two times a day (BID) | ORAL | 3 refills | Status: DC

## 2017-07-20 ENCOUNTER — Ambulatory Visit: Payer: Medicare Other | Admitting: Neurology

## 2017-07-21 ENCOUNTER — Encounter: Payer: Self-pay | Admitting: Neurology

## 2017-07-22 ENCOUNTER — Encounter: Payer: Self-pay | Admitting: Podiatry

## 2017-07-22 ENCOUNTER — Ambulatory Visit (INDEPENDENT_AMBULATORY_CARE_PROVIDER_SITE_OTHER): Payer: Medicare Other | Admitting: Podiatry

## 2017-07-22 DIAGNOSIS — B351 Tinea unguium: Secondary | ICD-10-CM

## 2017-07-22 DIAGNOSIS — D472 Monoclonal gammopathy: Secondary | ICD-10-CM

## 2017-07-22 NOTE — Progress Notes (Signed)
Patient ID: Richard Rosales, male   DOB: 1931-01-17, 84 y.o.   MRN: [id number] Complaint:  Visit Type: Patient returns to my office for continued preventative foot care services. Complaint: Patient states" my nails have grown long and thick and become painful to walk and wear shoes".  He also has metal appearing  on the tip of his big toe right foot. Patient has been diagnosed with DM with no complications. He presents for preventative foot care services. No changes to ROS  Podiatric Exam: Vascular: dorsalis pedis and posterior tibial pulses are absent bilateral. Capillary return is immediate. Temperature gradient is decreased.. Skin turgor WNL  Sensorium: Normal Semmes Weinstein monofilament test. Normal tactile sensation bilaterally. Nail Exam: Pt has thick disfigured discolored nails with subungual debris noted bilateral entire nail hallux through fifth toenails.   Ulcer Exam: There is no evidence of ulcer or pre-ulcerative changes or infection. Orthopedic Exam: Muscle tone and strength are WNL. No limitations in general ROM. No crepitus or effusions noted. Foot type and digits show no abnormalities. Bony prominences are unremarkable. Skin:  Asymptomatic porokeratosis present at the distal aspect right hallux.. No infection or ulcers  Diagnosis:  Tinea unguium, Pain in right toe, pain in left toe.   Treatment & Plan Procedures and Treatment: Consent by patient was obtained for treatment procedures. The patient understood the discussion of treatment and procedures well. All questions were answered thoroughly reviewed. Debridement of mycotic and hypertrophic toenails, 1 through 5 bilateral and clearing of subungual debris. No ulceration, no infection noted.   Return Visit-Office Procedure: Patient instructed to return to the office for a follow up visit 3 months for continued evaluation and treatment.  Gardiner Barefoot DPM

## 2017-07-29 ENCOUNTER — Other Ambulatory Visit: Payer: Self-pay | Admitting: Adult Health

## 2017-08-11 NOTE — Progress Notes (Signed)
MEDICARE ANNUAL WELLNESS VISIT AND FOLLOW UP Assessment:   Diagnoses and all orders for this visit:  Encounter for Medicare annual wellness exam  Aortic atherosclerosis ([place]) Control blood pressure, cholesterol, glucose, increase exercise.   Atrial fibrillation, unspecified type ([place]) Not on anticoagulation secondary to severe GI bleed Rate controlled Continue follow up with cardiology  Chronic diastolic heart failure ([place]) Weights stable- Disease process and medications discussed. Questions answered fully. Emphasized salt restriction, less than 2000mg  a day. Encouraged daily monitoring of the patient's weight, call office if 5 lb weight loss or gain in a day.  Encouraged regular exercise. If any increasing shortness of breath, swelling, or chest pressure go to ER immediately.  decrease your fluid intake to less than 2 L daily please remember to always increase your potassium intake with any increase of your fluid pill.  Continue to follow up with cardiology  Essential hypertension Continue medications Monitor blood pressure at home; call if consistently over 130/80 Continue DASH diet.   Reminder to go to the ER if any CP, SOB, nausea, dizziness, severe HA, changes vision/speech, left arm numbness and tingling and jaw pain.  Chronic obstructive pulmonary disease, unspecified COPD type () Continue inhalers, CXR annually, followed by pulmonology Dr. Lamonte Rosales  OSA and COPD overlap syndrome ([place]) Continue with CPAP; weight loss advised  Gastroesophageal reflux disease, esophagitis presence not specified Well managed on current medications Discussed diet, avoiding triggers and other lifestyle changes  Hypothyroidism, unspecified type continue medications the same pending lab results reminded to take on an empty stomach 30-49mins before food.  -     TSH  Essential tremor Followed by Neurology  Neuropathy associated with MGUS ([place]) Followed by neurology  Osteoarthritis,  unspecified osteoarthritis type, unspecified site Poor surgical candidate; continue with pain management  BPH with obstruction/lower urinary tract symptoms Symptoms stable at this time off of medications - restart as needed  History of colonic polyps Colonoscopies up to date; will not obtain further secondary to age and deconditioning  Medication management -     CBC with Differential/Platelet -     BASIC METABOLIC PANEL WITH GFR -     Hepatic function panel  MGUS (monoclonal gammopathy of unknown significance) Continue follow up as recommended by Dr. Lindi Rosales -     CBC with Differential/Platelet  Mixed hyperlipidemia At goal off of medications; continue omega 3 supplement Continue low cholesterol diet and exercise.  Check lipid panel.  -     Lipid panel  Obesity, morbid ([place]) Long discussion about weight loss, diet, exercise limited by advanced cardiac disease Recommended diet heavy in fruits and veggies and low in animal meats, cheeses, and dairy products, appropriate calorie intake Discussed appropriate weight for height  Follow up at next visit  Physical deconditioning Has has physical therapy; improved from previous; continues with exercises prescribed  Prediabetes Labile A1Cs on review; has been trending up Discussed disease and risks Discussed diet/exercise, weight management  -     Hemoglobin A1c  Vitamin D deficiency Very near goal at recent check; continue to recommend supplementation for goal of 70-100 Defer vitamin D level  Need for pneumococcal vaccination Prevnar 13 administered   Venous ulcers of bilateral lower extremities (pretibial) Appears superficial, per the patient "80% better" and improving Continue with dressing changes, compression Discussed needs to elevate extremities and prevent severe edema for resolution Discussed referral for wound care, patient declines at this time, will continue to monitor progress  Over 30 minutes of exam,  counseling, chart review, and  critical decision making was performed  Future Appointments  Date Time Provider [place]  10/21/2017  2:45 PM Gardiner Barefoot, DPM [facility]  11/19/2017  3:00 PM Unk Pinto, MD [facility] None     Plan:   During the course of the visit the patient was educated and counseled about appropriate screening and preventive services including:    Pneumococcal vaccine   Influenza vaccine  Prevnar 13  Td vaccine  Screening electrocardiogram  Colorectal cancer screening  Diabetes screening  Glaucoma screening  Nutrition counseling    Subjective:  Richard Rosales is a 84 y.o. male with Hypertension, ASHD/cAfib, T2_NIDDM, severe COPD/Pulm HTN, OSA/CPAPO,  Hyperlipidemia,  not on Anticoagulant because of life threatening lower GI Bleeding on Coumadin. Patient also has a Chronic Diastolic CHF with severe chronic venostasis Disease. He is minimally ambulatory and is mostly wheelchair dependent,  presents today for Medicare Annual Wellness Visit and 3 month follow up for HTN, hyperlipidemia, glucose management, and vitamin D Def.  Wears 2 L O2 to support O2 sat above 92%-Wear CPAP at night with O2. In office on RA 94%, able to complete full sentences.   He has a history of Diastolic, denies orthopnea and paroxysmal nocturnal dyspnea. Positive for fatigue and edema. Also has exertional dyspena.  Wt Readings from Last 3 Encounters:  08/12/17 240 lb (108.9 kg)  05/27/17 244 lb (110.7 kg)  05/07/17 242 lb 12.8 oz (110.1 kg)   His blood pressure has been controlled at home, today their BP is BP: 110/64 He does workout with CPAP machine on. He denies chest pain, shortness of breath, dizziness.   He is not on cholesterol medication and denies myalgias. His cholesterol is at goal. The cholesterol last visit was:   Lab Results  Component Value Date   CHOL 159 05/07/2017   HDL 63 05/07/2017   LDLCALC 56 10/16/2016   TRIG 100 05/07/2017    CHOLHDL 2.5 05/07/2017   He has been working on diet  for glucose management, and denies increased appetite, nausea, polydipsia, polyuria, visual disturbances, vomiting and weight loss. Last A1C in the office was:  Lab Results  Component Value Date   HGBA1C 5.5 05/07/2017   Last GFR Lab Results  Component Value Date   GFRNONAA 76 05/07/2017    Patient is on Vitamin D supplement and near goal of 70 at last check:    Lab Results  Component Value Date   VD25OH 62 05/07/2017      Medication Review: Current Outpatient Medications on File Prior to Visit  Medication Sig Dispense Refill  . acetaminophen-codeine (TYLENOL #3) 300-30 MG tablet Take 1 tablet by mouth every 4 (four) hours as needed for moderate pain. 180 tablet 3  . albuterol (PROVENTIL HFA;VENTOLIN HFA) 108 (90 Base) MCG/ACT inhaler Inhale 2 puffs into the lungs every 6 (six) hours as needed for wheezing or shortness of breath. 1 Inhaler 2  . albuterol (PROVENTIL) (2.5 MG/3ML) 0.083% nebulizer solution Take 3 mLs (2.5 mg total) by nebulization every 4 (four) hours. 300 mL 11  . amoxicillin (AMOXIL) 500 MG capsule as directed. (DENTAL)    . aspirin 81 MG tablet Take 1 tablet 3 days per week    . azelastine (ASTELIN) 0.1 % nasal spray USE 1 SPRAY IN EACH NOSTRIL TWICE A DAY 90 mL 1  . b complex vitamins tablet Take 1 tablet by mouth every morning.     . chlorpheniramine (CHLOR-TRIMETON) 4 MG tablet Take 1 tablet by mouth in the morning  and 1 tablet at bedtime    . Cholecalciferol (VITAMIN D) 2000 UNITS CAPS Take 2 capsules by mouth daily.     Marland Kitchen diltiazem (DILT-XR) 240 MG 24 hr capsule Take 1 capsule (240 mg total) by mouth 2 (two) times daily. 180 capsule 3  . docusate sodium (COLACE) 100 MG capsule Take 100 mg by mouth 2 (two) times daily.      . fish oil-omega-3 fatty acids 1000 MG capsule Take 1 g by mouth 2 (two) times daily.      . furosemide (LASIX) 40 MG tablet TAKE 1 TABLET TWICE A DAY FOR FLUID RETENTION 180 tablet 1  .  glucosamine-chondroitin 500-400 MG tablet Take 1 tablet by mouth 1 day or 1 dose.    Marland Kitchen guaifenesin (HUMIBID E) 400 MG TABS Take 400 mg by mouth 2 (two) times daily.      Marland Kitchen HYDROcodone-acetaminophen (NORCO/VICODIN) 5-325 MG per tablet Take 1 tablet by mouth every 6 (six) hours as needed for moderate pain (Only takes during CT/MRI testing).     Marland Kitchen levothyroxine (SYNTHROID, LEVOTHROID) 100 MCG tablet TAKE 1 TABLET DAILY BEFORE BREAKFAST 90 tablet 4  . losartan (COZAAR) 100 MG tablet TAKE 1 TABLET DAILY 90 tablet 1  . metolazone (ZAROXOLYN) 5 MG tablet TAKE 1 TABLET DAILY AS DIRECTED FOR FLUID AND SWELLING 90 tablet 1  . omeprazole (PRILOSEC) 40 MG capsule TAKE 1 CAPSULE TWICE A DAY 180 capsule 1  . potassium chloride (K-DUR) 10 MEQ tablet Take 1 tablet (10 mEq total) by mouth daily. 30 tablet 0  . SPIRIVA HANDIHALER 18 MCG inhalation capsule INHALE THE CONTENTS OF 1 CAPSULE DAILY 90 capsule 1  . SYMBICORT 160-4.5 MCG/ACT inhaler USE 2 INHALATIONS TWICE A DAY 30.6 g 3   No current facility-administered medications on file prior to visit.     Allergies: Allergies  Allergen Reactions  . Ace Inhibitors Cough  . Beta Adrenergic Blockers Other (See Comments)    bradycardia  . Decadron [Dexamethasone]     unknown  . Fluticasone-Salmeterol     Doesn't remember  . Hytrin [Terazosin]     Nasal congestion   . Prednisone     Shakes / tremors  . Vioxx [Rofecoxib] Other (See Comments)    dyspepsia    Current Problems (verified) has OSA and COPD overlap syndrome ([place]); Essential hypertension; Atrial fibrillation ([place]); Chronic diastolic heart failure ([facility]); COPD (chronic obstructive pulmonary disease) ([place]); GERD; Diverticulosis of colon with hemorrhage; History of colonic polyps; BPH with obstruction/lower urinary tract symptoms; Obesity, morbid ([place]); DJD (degenerative joint disease); Allergic rhinitis; Hypothyroidism; Mixed hyperlipidemia; Prediabetes; Vitamin D deficiency; Medication management;  Hypertensive cardiovascular disease; Encounter for Medicare annual wellness exam; Aortic atherosclerosis ([place]); Gait disturbance; Polyneuropathy; Neuropathy associated with MGUS ([place]); MGUS (monoclonal gammopathy of unknown significance); Essential tremor; and Physical deconditioning on their problem list.  Screening Tests Immunization History  Administered Date(s) Administered  . DT 07/26/2014  . DTaP 06/24/2003  . Influenza Split 04/24/2011, 03/23/2012, 03/10/2013  . Influenza Whole 03/23/2009, 03/23/2010  . Influenza, High Dose Seasonal PF 04/13/2014, 03/03/2016, 04/07/2017  . Influenza,inj,Quad PF,6+ Mos 03/01/2015  . Pneumococcal Polysaccharide-23 05/23/1994, 11/22/2001, 05/28/2010  . Tdap 06/24/2003  . Zoster 09/14/2013    Preventative care: Last colonoscopy: 2001, will not get another due to age Echo 2015 Stress test 2005  Prior vaccinations: TD or Tdap: 2016  Influenza: 2017 Pneumococcal: 2011 Prevnar13: DUE  Shingles/Zostavax: 2015  Names of Other Physician/Practitioners you currently use: 1. [facility] here  for primary care 2. Dr. Katy Fitch, eye doctor, last visit 2018 3. Dr. Raelyn Ensign, dentist, last visit 2018  Patient Care Team: Unk Pinto, MD as PCP - General (Internal Medicine) Collene Gobble, MD as Consulting Physician (Pulmonary Disease) Inda Castle, MD (Inactive) as Consulting Physician (Gastroenterology) Thompson Grayer, MD as Consulting Physician (Cardiology)  Surgical: He  has a past surgical history that includes Cardiac catheterization (7.27.05); Tonsillectomy; Total knee arthroplasty; Nasal sinus surgery; Colonoscopy; Joint replacement (2001); Hernia repair (Bilateral); Shoulder arthroscopy; Polypectomy; and Toe Surgery (Right). Family His family history includes Diabetes in his father; Hypertension in his mother; Lung cancer in his brother. Social history  He reports that he quit smoking about 39 years ago.  His smoking use included cigarettes. He has a 20.00 pack-year smoking history. he has never used smokeless tobacco. He reports that he drinks alcohol. He reports that he does not use drugs.  MEDICARE WELLNESS OBJECTIVES: Physical activity: Current Exercise Habits: Home exercise routine, Type of exercise: strength training/weights, Time (Minutes): 60, Frequency (Times/Week): 6, Weekly Exercise (Minutes/Week): 360, Intensity: Mild, Exercise limited by: respiratory conditions(s);cardiac condition(s) Cardiac risk factors: Cardiac Risk Factors include: advanced age (>36men, >83 women);obesity (BMI >30kg/m2);sedentary lifestyle;male gender;hypertension;dyslipidemia Depression/mood screen:   Depression screen [facility] 2/9 08/12/2017  Decreased Interest 0  Down, Depressed, Hopeless 0  PHQ - 2 Score 0  Some recent data might be hidden    ADLs:  In your present state of health, do you have any difficulty performing the following activities: 08/12/2017 05/07/2017  Hearing? Y N  Comment Has bilatearl hearing aids.  -  Vision? N -  Difficulty concentrating or making decisions? N N  Walking or climbing stairs? Y N  Comment Uses walker in home, wheelchair while out, cannot walk without assistance, severe deonditioning -  Dressing or bathing? N N  Doing errands, shopping? Y Y  Comment - requires Dispensing optician and eating ? N -  Using the Toilet? N -  In the past six months, have you accidently leaked urine? N -  Do you have problems with loss of bowel control? N -  Managing your Medications? N -  Managing your Finances? N -  Housekeeping or managing your Housekeeping? Y -  Comment Has hired help, wife does some -  Some recent data might be hidden     Cognitive Testing  Alert? Yes  Normal Appearance?Yes  Oriented to person? Yes  Place? Yes   Time? Yes  Recall of three objects?  Yes  Can perform simple calculations? Yes  Displays appropriate judgment?Yes  Can read the correct time from a  watch face?Yes  EOL planning: Does Patient Have a Medical Advance Directive?: Yes Type of Advance Directive: Healthcare Power of Attorney, Living will Does patient want to make changes to medical advance directive?: No - Patient declined Copy of [place] in Chart?: No - copy requested   Objective:   Today's Vitals   08/12/17 1448  BP: 110/64  Pulse: 87  Temp: 98.1 F (36.7 C)  SpO2: 90%  Weight: 240 lb (108.9 kg)  Height: 5\' 8"  (1.727 m)   Body mass index is 36.49 kg/m.  General appearance: alert, no distress, WD/WN, male in wheelchair HEENT: normocephalic, sclerae anicteric, TMs pearly, nares patent, no discharge or erythema, pharynx normal Oral cavity: MMM, no lesions Neck: supple, no lymphadenopathy, no thyromegaly, no masses Heart: Rate ?irregularly irregular, difficult to auscultate, no audible murmurs Lungs: CTA bilaterally on inspiration, coarse/ scattered rhronchi through all  lobes with forced expiration Abdomen: +bs, soft, non tender, non distended, no palpable masses, no hepatomegaly, no splenomegaly Musculoskeletal: nontender, no obvious deformity Extremities: Severe edema bilatearl, no cyanosis, no clubbing Pulses: 2+ symmetric, upper extremities, pulses difficult to locate in lower extremities due to severe trunk-like LE and ankles, thickened/tough skin, superficial wounds to bilateral pretibial areas, approx 5 cm areas bilaterally, appears superficial with serous fluid present to dressings, pink around edges. Simple non-adherent dressings and compression hose present bilaterally . Neurological: alert, oriented x 3, CN2-12 intact, strength normal upper extremities and lower extremities, sensation normal throughout, gait not examined, struggles from sitting to standing requires assistance Psychiatric: normal affect, behavior normal, pleasant   Medicare Attestation I have personally reviewed: The patient's medical and social history Their use of  alcohol, tobacco or illicit drugs Their current medications and supplements The patient's functional ability including ADLs,fall risks, home safety risks, cognitive, and hearing and visual impairment Diet and physical activities Evidence for depression or mood disorders  The patient's weight, height, BMI, and visual acuity have been recorded in the chart.  I have made referrals, counseling, and provided education to the patient based on review of the above and I have provided the patient with a written personalized care plan for preventive services.     Izora Ribas, NP   08/12/2017

## 2017-08-12 ENCOUNTER — Encounter: Payer: Self-pay | Admitting: Adult Health

## 2017-08-12 ENCOUNTER — Ambulatory Visit (INDEPENDENT_AMBULATORY_CARE_PROVIDER_SITE_OTHER): Payer: Medicare Other | Admitting: Adult Health

## 2017-08-12 VITALS — BP 110/64 | HR 87 | Temp 98.1°F | Ht 68.0 in | Wt 240.0 lb

## 2017-08-12 DIAGNOSIS — I872 Venous insufficiency (chronic) (peripheral): Secondary | ICD-10-CM | POA: Diagnosis not present

## 2017-08-12 DIAGNOSIS — Z0001 Encounter for general adult medical examination with abnormal findings: Secondary | ICD-10-CM

## 2017-08-12 DIAGNOSIS — Z79899 Other long term (current) drug therapy: Secondary | ICD-10-CM | POA: Diagnosis not present

## 2017-08-12 DIAGNOSIS — K219 Gastro-esophageal reflux disease without esophagitis: Secondary | ICD-10-CM

## 2017-08-12 DIAGNOSIS — E039 Hypothyroidism, unspecified: Secondary | ICD-10-CM

## 2017-08-12 DIAGNOSIS — I83029 Varicose veins of left lower extremity with ulcer of unspecified site: Secondary | ICD-10-CM

## 2017-08-12 DIAGNOSIS — G250 Essential tremor: Secondary | ICD-10-CM | POA: Diagnosis not present

## 2017-08-12 DIAGNOSIS — N401 Enlarged prostate with lower urinary tract symptoms: Secondary | ICD-10-CM | POA: Diagnosis not present

## 2017-08-12 DIAGNOSIS — J449 Chronic obstructive pulmonary disease, unspecified: Secondary | ICD-10-CM

## 2017-08-12 DIAGNOSIS — L97919 Non-pressure chronic ulcer of unspecified part of right lower leg with unspecified severity: Secondary | ICD-10-CM

## 2017-08-12 DIAGNOSIS — L97929 Non-pressure chronic ulcer of unspecified part of left lower leg with unspecified severity: Secondary | ICD-10-CM

## 2017-08-12 DIAGNOSIS — R6889 Other general symptoms and signs: Secondary | ICD-10-CM | POA: Diagnosis not present

## 2017-08-12 DIAGNOSIS — G63 Polyneuropathy in diseases classified elsewhere: Secondary | ICD-10-CM

## 2017-08-12 DIAGNOSIS — I5032 Chronic diastolic (congestive) heart failure: Secondary | ICD-10-CM

## 2017-08-12 DIAGNOSIS — G4733 Obstructive sleep apnea (adult) (pediatric): Secondary | ICD-10-CM

## 2017-08-12 DIAGNOSIS — D472 Monoclonal gammopathy: Secondary | ICD-10-CM

## 2017-08-12 DIAGNOSIS — I10 Essential (primary) hypertension: Secondary | ICD-10-CM | POA: Diagnosis not present

## 2017-08-12 DIAGNOSIS — I4891 Unspecified atrial fibrillation: Secondary | ICD-10-CM | POA: Diagnosis not present

## 2017-08-12 DIAGNOSIS — M1990 Unspecified osteoarthritis, unspecified site: Secondary | ICD-10-CM | POA: Diagnosis not present

## 2017-08-12 DIAGNOSIS — E782 Mixed hyperlipidemia: Secondary | ICD-10-CM

## 2017-08-12 DIAGNOSIS — Z23 Encounter for immunization: Secondary | ICD-10-CM

## 2017-08-12 DIAGNOSIS — R7303 Prediabetes: Secondary | ICD-10-CM | POA: Diagnosis not present

## 2017-08-12 DIAGNOSIS — Z86010 Personal history of colonic polyps: Secondary | ICD-10-CM

## 2017-08-12 DIAGNOSIS — R5381 Other malaise: Secondary | ICD-10-CM

## 2017-08-12 DIAGNOSIS — N138 Other obstructive and reflux uropathy: Secondary | ICD-10-CM

## 2017-08-12 DIAGNOSIS — I700 Atherosclerosis of aorta: Secondary | ICD-10-CM

## 2017-08-12 DIAGNOSIS — Z0000 Encounter for general adult medical examination without abnormal findings: Secondary | ICD-10-CM

## 2017-08-12 DIAGNOSIS — E559 Vitamin D deficiency, unspecified: Secondary | ICD-10-CM

## 2017-08-13 LAB — BASIC METABOLIC PANEL WITH GFR
BUN: 23 mg/dL (ref 7–25)
CALCIUM: 9.5 mg/dL (ref 8.6–10.3)
CHLORIDE: 93 mmol/L — AB (ref 98–110)
CO2: 33 mmol/L — AB (ref 20–32)
Creat: 1.09 mg/dL (ref 0.70–1.11)
GFR, EST AFRICAN AMERICAN: 72 mL/min/{1.73_m2} (ref >=60)
GFR, Est Non African American: 62 mL/min/{1.73_m2} (ref >=60)
Glucose, Bld: 88 mg/dL (ref 65–99)
POTASSIUM: 4.1 mmol/L (ref 3.5–5.3)
Sodium: 135 mmol/L (ref 135–146)

## 2017-08-13 LAB — CBC WITH DIFFERENTIAL/PLATELET
Basophils Absolute: 67 cells/uL (ref 0–200)
Basophils Relative: 0.7 %
EOS PCT: 4.2 %
Eosinophils Absolute: 399 cells/uL (ref 15–500)
HCT: 41.7 % (ref 38.5–50.0)
Hemoglobin: 14.2 g/dL (ref 13.2–17.1)
Lymphs Abs: 1131 cells/uL (ref 850–3900)
MCH: 30.5 pg (ref 27.0–33.0)
MCHC: 34.1 g/dL (ref 32.0–36.0)
MCV: 89.5 fL (ref 80.0–100.0)
MPV: 10.8 fL (ref 7.5–12.5)
Monocytes Relative: 10.9 %
Neutro Abs: 6869 cells/uL (ref 1500–7800)
Neutrophils Relative %: 72.3 %
PLATELETS: 205 10*3/uL (ref 140–400)
RBC: 4.66 10*6/uL (ref 4.20–5.80)
RDW: 11.9 % (ref 11.0–15.0)
TOTAL LYMPHOCYTE: 11.9 %
WBC: 9.5 10*3/uL (ref 3.8–10.8)
WBCMIX: 1036 {cells}/uL — AB (ref 200–950)

## 2017-08-13 LAB — HEPATIC FUNCTION PANEL
AG Ratio: 1.6 (calc) (ref 1.0–2.5)
ALBUMIN MSPROF: 4.1 g/dL (ref 3.6–5.1)
ALT: 19 U/L (ref 9–46)
AST: 26 U/L (ref 10–35)
Alkaline phosphatase (APISO): 97 U/L (ref 40–115)
Bilirubin, Direct: 0.2 mg/dL (ref 0.0–0.2)
GLOBULIN: 2.5 g/dL (ref 1.9–3.7)
Indirect Bilirubin: 0.5 mg/dL (calc) (ref 0.2–1.2)
TOTAL PROTEIN: 6.6 g/dL (ref 6.1–8.1)
Total Bilirubin: 0.7 mg/dL (ref 0.2–1.2)

## 2017-08-13 LAB — LIPID PANEL
CHOL/HDL RATIO: 2.6 (calc) (ref <5.0)
CHOLESTEROL: 147 mg/dL (ref <200)
HDL: 56 mg/dL (ref >40)
LDL CHOLESTEROL (CALC): 73 mg/dL
Non-HDL Cholesterol (Calc): 91 mg/dL (calc) (ref <130)
TRIGLYCERIDES: 94 mg/dL (ref <150)

## 2017-08-13 LAB — TSH: TSH: 3.11 mIU/L (ref 0.40–4.50)

## 2017-08-13 LAB — HEMOGLOBIN A1C
Hgb A1c MFr Bld: 5.6 % of total Hgb (ref <5.7)
Mean Plasma Glucose: 114 (calc)
eAG (mmol/L): 6.3 (calc)

## 2017-08-17 ENCOUNTER — Other Ambulatory Visit: Payer: Self-pay | Admitting: Emergency Medicine

## 2017-08-23 ENCOUNTER — Other Ambulatory Visit: Payer: Self-pay | Admitting: Internal Medicine

## 2017-10-21 ENCOUNTER — Encounter: Payer: Self-pay | Admitting: Podiatry

## 2017-10-21 ENCOUNTER — Ambulatory Visit (INDEPENDENT_AMBULATORY_CARE_PROVIDER_SITE_OTHER): Payer: Medicare Other | Admitting: Podiatry

## 2017-10-21 DIAGNOSIS — B351 Tinea unguium: Secondary | ICD-10-CM | POA: Diagnosis not present

## 2017-10-21 DIAGNOSIS — M79676 Pain in unspecified toe(s): Secondary | ICD-10-CM | POA: Diagnosis not present

## 2017-10-21 NOTE — Progress Notes (Signed)
Patient ID: Richard Rosales, male   DOB: Sep 05, 1930, 84 y.o.   MRN: [id number] Complaint:  Visit Type: Patient returns to my office for continued preventative foot care services. Complaint: Patient states" my nails have grown long and thick and become painful to walk and wear shoes".  He also has metal appearing  on the tip of his big toe right foot. Patient has been diagnosed with DM with no complications. He presents for preventative foot care services. No changes to ROS  Podiatric Exam: Vascular: dorsalis pedis and posterior tibial pulses are absent bilateral. Capillary return is immediate. Temperature gradient is decreased.. Skin turgor WNL  Sensorium: Normal Semmes Weinstein monofilament test. Normal tactile sensation bilaterally. Nail Exam: Pt has thick disfigured discolored nails with subungual debris noted bilateral entire nail hallux through fifth toenails.   Ulcer Exam: There is no evidence of ulcer or pre-ulcerative changes or infection. Orthopedic Exam: Muscle tone and strength are WNL. No limitations in general ROM. No crepitus or effusions noted. Foot type and digits show no abnormalities. Bony prominences are unremarkable. Skin:  Asymptomatic screw head right hallux.. No infection or ulcers  Diagnosis:  Tinea unguium, Pain in right toe, pain in left toe.   Treatment & Plan Procedures and Treatment: Consent by patient was obtained for treatment procedures. The patient understood the discussion of treatment and procedures well. All questions were answered thoroughly reviewed. Debridement of mycotic and hypertrophic toenails, 1 through 5 bilateral and clearing of subungual debris. No ulceration, no infection noted.   Return Visit-Office Procedure: Patient instructed to return to the office for a follow up visit 3 months for continued evaluation and treatment.  Gardiner Barefoot DPM

## 2017-10-22 ENCOUNTER — Ambulatory Visit (INDEPENDENT_AMBULATORY_CARE_PROVIDER_SITE_OTHER): Payer: Medicare Other | Admitting: Adult Health

## 2017-10-22 ENCOUNTER — Encounter: Payer: Self-pay | Admitting: Adult Health

## 2017-10-22 VITALS — BP 114/64 | HR 66 | Temp 97.5°F

## 2017-10-22 DIAGNOSIS — I872 Venous insufficiency (chronic) (peripheral): Secondary | ICD-10-CM

## 2017-10-22 DIAGNOSIS — L97919 Non-pressure chronic ulcer of unspecified part of right lower leg with unspecified severity: Secondary | ICD-10-CM | POA: Diagnosis not present

## 2017-10-22 DIAGNOSIS — L97929 Non-pressure chronic ulcer of unspecified part of left lower leg with unspecified severity: Secondary | ICD-10-CM

## 2017-10-22 DIAGNOSIS — I83029 Varicose veins of left lower extremity with ulcer of unspecified site: Secondary | ICD-10-CM

## 2017-10-22 DIAGNOSIS — S81809D Unspecified open wound, unspecified lower leg, subsequent encounter: Secondary | ICD-10-CM

## 2017-10-22 DIAGNOSIS — S81809A Unspecified open wound, unspecified lower leg, initial encounter: Secondary | ICD-10-CM | POA: Insufficient documentation

## 2017-10-22 MED ORDER — CEPHALEXIN 500 MG PO CAPS: 500.0000 mg | ORAL_CAPSULE | Freq: Three times a day (TID) | ORAL | 0 refills | Status: DC

## 2017-10-22 NOTE — Patient Instructions (Signed)
Put up your legs as much as possible while sitting at home - most important intervention is to try to improve the edema in your legs    Cephalexin tablets or capsules What is this medicine? CEPHALEXIN (sef a LEX in) is a cephalosporin antibiotic. It is used to treat certain kinds of bacterial infections It will not work for colds, flu, or other viral infections. This medicine may be used for other purposes; ask your health care provider or pharmacist if you have questions. COMMON BRAND NAME(S): Biocef, Daxbia, Keflex, Keftab What should I tell my health care provider before I take this medicine? They need to know if you have any of these conditions: -kidney disease -stomach or intestine problems, especially colitis -an unusual or allergic reaction to cephalexin, other cephalosporins, penicillins, other antibiotics, medicines, foods, dyes or preservatives -pregnant or trying to get pregnant -breast-feeding How should I use this medicine? Take this medicine by mouth with a full glass of water. Follow the directions on the prescription label. This medicine can be taken with or without food. Take your medicine at regular intervals. Do not take your medicine more often than directed. Take all of your medicine as directed even if you think you are better. Do not skip doses or stop your medicine early. Talk to your pediatrician regarding the use of this medicine in children. While this drug may be prescribed for selected conditions, precautions do apply. Overdosage: If you think you have taken too much of this medicine contact a poison control center or emergency room at once. NOTE: This medicine is only for you. Do not share this medicine with others. What if I miss a dose? If you miss a dose, take it as soon as you can. If it is almost time for your next dose, take only that dose. Do not take double or extra doses. There should be at least 4 to 6 hours between doses. What may interact with this  medicine? -probenecid -some other antibiotics This list may not describe all possible interactions. Give your health care provider a list of all the medicines, herbs, non-prescription drugs, or dietary supplements you use. Also tell them if you smoke, drink alcohol, or use illegal drugs. Some items may interact with your medicine. What should I watch for while using this medicine? Tell your doctor or health care professional if your symptoms do not begin to improve in a few days. Do not treat diarrhea with over the counter products. Contact your doctor if you have diarrhea that lasts more than 2 days or if it is severe and watery. If you have diabetes, you may get a false-positive result for sugar in your urine. Check with your doctor or health care professional. What side effects may I notice from receiving this medicine? Side effects that you should report to your doctor or health care professional as soon as possible: -allergic reactions like skin rash, itching or hives, swelling of the face, lips, or tongue -breathing problems -pain or trouble passing urine -redness, blistering, peeling or loosening of the skin, including inside the mouth -severe or watery diarrhea -unusually weak or tired -yellowing of the eyes, skin Side effects that usually do not require medical attention (report to your doctor or health care professional if they continue or are bothersome): -gas or heartburn -genital or anal irritation -headache -joint or muscle pain -nausea, vomiting This list may not describe all possible side effects. Call your doctor for medical advice about side effects. You may report side effects  to FDA at 1-800-FDA-1088. Where should I keep my medicine? Keep out of the reach of children. Store at room temperature between 59 and 86 degrees F (15 and 30 degrees C). Throw away any unused medicine after the expiration date. NOTE: This sheet is a summary. It may not cover all possible  information. If you have questions about this medicine, talk to your doctor, pharmacist, or health care provider.  2018 Elsevier/Gold Standard (2007-09-13 17:09:13)

## 2017-10-22 NOTE — Progress Notes (Signed)
Assessment and Plan:  Cyler was seen today for leg problem.  Diagnoses and all orders for this visit:  Non-healing wound of lower extremity, unspecified laterality, subsequent encounter/ venous ulcers Bilateral shins, superficial, Ongoing for ~10 months Discussed adherence to lasix for severe edema, continue with support hose, elevate extremities as much as possible while at home Will initiate abx therapy and refer to management by wound center - ? Possibility of unna boot due to location Follow up as scheduled in 1 month -     cephALEXin (KEFLEX) 500 MG capsule; Take 1 capsule (500 mg total) by mouth 3 (three) times daily. -     AMB referral to wound care center  Further disposition pending results of labs. Discussed med's effects and SE's.   Over 30 minutes of exam, counseling, chart review, and critical decision making was performed.   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 On review, these were discussed at [place] in 08/12/2017 - at which patient had reported they were "80% better" with regular dressing changes and extremity elevation and compression therapy. Abx have not been prescribed thus far, he reports he  also tried applying "crisco and saran wrap" which was not beneficial. Patient is admittedly not consistently compliant with diuretic therapy recommendations.   Past Medical History:  Diagnosis Date  . Acute bronchitis   . Adenomatous colon polyp   . Allergic rhinitis   . Anemia    iron deficient  . Atrial fibrillation ([place])   . Atrial flutter ([place])   . Chronic gastritis   . COPD (chronic obstructive pulmonary disease) ([place])   . Diastolic dysfunction   . Diverticulosis of colon    recurrent GI bleeding  . DJD (degenerative joint disease)   . GERD (gastroesophageal reflux disease)   . GI bleed   . HTN (hypertension)   . Hyperplastic colon polyp 2003  . Hypothyroidism   . Moderate aortic insufficiency   . OSA (obstructive sleep apnea)    compliants w/ CPAP  . Positive PPD    remote  . Shortness of breath   . Venous insufficiency      Allergies  Allergen Reactions  . Ace Inhibitors Cough  . Beta Adrenergic Blockers Other (See Comments)    bradycardia  . Decadron [Dexamethasone]     unknown  . Fluticasone-Salmeterol     Doesn't remember  . Hytrin [Terazosin]     Nasal congestion   . Prednisone     Shakes / tremors  . Vioxx [Rofecoxib] Other (See Comments)    dyspepsia    Current Outpatient Medications on File Prior to Visit  Medication Sig  . acetaminophen-codeine (TYLENOL #3) 300-30 MG tablet Take 1 tablet by mouth every 4 (four) hours as needed for moderate pain.  Marland Kitchen albuterol (PROVENTIL HFA;VENTOLIN HFA) 108 (  90 Base) MCG/ACT inhaler Inhale 2 puffs into the lungs every 6 (six) hours as needed for wheezing or shortness of breath.  Marland Kitchen albuterol (PROVENTIL) (2.5 MG/3ML) 0.083% nebulizer solution Take 3 mLs (2.5 mg total) by nebulization every 4 (four) hours.  Marland Kitchen amoxicillin (AMOXIL) 500 MG capsule as directed. (DENTAL)  . aspirin 81 MG tablet Take 1 tablet 3 days per week  . azelastine (ASTELIN) 0.1 % nasal spray USE 1 SPRAY IN EACH NOSTRIL TWICE A DAY  . b complex  vitamins tablet Take 1 tablet by mouth every morning.   . chlorpheniramine (CHLOR-TRIMETON) 4 MG tablet Take 1 tablet by mouth in the morning and 1 tablet at bedtime  . Cholecalciferol (VITAMIN D) 2000 UNITS CAPS Take 2 capsules by mouth daily.   Marland Kitchen DILT-XR 240 MG 24 hr capsule TAKE 1 CAPSULE TWICE A DAY  . docusate sodium (COLACE) 100 MG capsule Take 100 mg by mouth 2 (two) times daily.    . fish oil-omega-3 fatty acids 1000 MG capsule Take 1 g by mouth 2 (two) times daily.    . furosemide (LASIX) 40 MG tablet TAKE 1 TABLET TWICE A DAY FOR FLUID RETENTION  . glucosamine-chondroitin 500-400 MG tablet Take 1 tablet by mouth 1 day or 1 dose.  Marland Kitchen guaifenesin (HUMIBID E) 400 MG TABS Take 400 mg by mouth 2 (two) times daily.    Marland Kitchen HYDROcodone-acetaminophen (NORCO/VICODIN) 5-325 MG per tablet Take 1 tablet by mouth every 6 (six) hours as needed for moderate pain (Only takes during CT/MRI testing).   Marland Kitchen levothyroxine (SYNTHROID, LEVOTHROID) 100 MCG tablet TAKE 1 TABLET DAILY BEFORE BREAKFAST  . losartan (COZAAR) 100 MG tablet TAKE 1 TABLET DAILY  . metolazone (ZAROXOLYN) 5 MG tablet TAKE 1 TABLET DAILY AS DIRECTED FOR FLUID AND SWELLING  . omeprazole (PRILOSEC) 40 MG capsule TAKE 1 CAPSULE TWICE A DAY  . potassium chloride (K-DUR) 10 MEQ tablet Take 1 tablet (10 mEq total) by mouth daily.  Marland Kitchen SPIRIVA HANDIHALER 18 MCG inhalation capsule INHALE THE CONTENTS OF 1 CAPSULE DAILY  . SYMBICORT 160-4.5 MCG/ACT inhaler USE 2 INHALATIONS TWICE A DAY   No current facility-administered medications on file prior to visit.     ROS: all negative except above.   Physical Exam:  BP 114/64   Pulse 66   Temp (!) 97.5 F (36.4 C)   SpO2 (!) 88%   General Appearance: Well nourished, in no apparent distress. Eyes: PERRLA, EOMs, conjunctiva no swelling or erythema ENT/Mouth: Ext aud canals clear, TMs without erythema, bulging. No erythema, swelling, or exudate on post pharynx.  Tonsils not swollen or erythematous.  Hearing normal.  Neck: Supple.  Respiratory: Respiratory effort normal, BS equal bilaterally without rales, rhonchi, wheezing or stridor.  Cardio: RRR with no MRGs. Bilateral lower extremities with severe pitting edema 3-4+ bilaterally Lymphatics: Non tender without lymphadenopathy.  Musculoskeletal: No obvious deformity, limited to wheelchair Skin: Warm, dry; bilateral shin wounds, appears superficial, each spanning 10-15 cm, with serous crust in center with erythematous base and rim, no visible ulceration Psych: Awake and oriented X 3, normal affect, Insight and Judgment appropriate.     Izora Ribas, NP 5:47 PM [facility]

## 2017-10-24 ENCOUNTER — Other Ambulatory Visit: Payer: Self-pay | Admitting: Emergency Medicine

## 2017-10-25 ENCOUNTER — Emergency Department (HOSPITAL_COMMUNITY): Payer: Medicare Other

## 2017-10-25 ENCOUNTER — Emergency Department (HOSPITAL_COMMUNITY)
Admission: EM | Admit: 2017-10-25 | Discharge: 2017-10-25 | Disposition: A | Discharge status: Home / Self Care | Payer: Medicare Other | Source: Home / Self Care | Attending: Emergency Medicine | Admitting: Emergency Medicine

## 2017-10-25 ENCOUNTER — Encounter (HOSPITAL_COMMUNITY): Payer: Self-pay

## 2017-10-25 DIAGNOSIS — I5032 Chronic diastolic (congestive) heart failure: Secondary | ICD-10-CM | POA: Insufficient documentation

## 2017-10-25 DIAGNOSIS — I6789 Other cerebrovascular disease: Secondary | ICD-10-CM | POA: Diagnosis not present

## 2017-10-25 DIAGNOSIS — R262 Difficulty in walking, not elsewhere classified: Secondary | ICD-10-CM | POA: Insufficient documentation

## 2017-10-25 DIAGNOSIS — I110 Hypertensive heart disease with heart failure: Secondary | ICD-10-CM | POA: Insufficient documentation

## 2017-10-25 DIAGNOSIS — R0989 Other specified symptoms and signs involving the circulatory and respiratory systems: Secondary | ICD-10-CM | POA: Diagnosis not present

## 2017-10-25 DIAGNOSIS — R531 Weakness: Secondary | ICD-10-CM | POA: Diagnosis not present

## 2017-10-25 DIAGNOSIS — R202 Paresthesia of skin: Secondary | ICD-10-CM

## 2017-10-25 DIAGNOSIS — R404 Transient alteration of awareness: Secondary | ICD-10-CM | POA: Diagnosis not present

## 2017-10-25 DIAGNOSIS — Z96653 Presence of artificial knee joint, bilateral: Secondary | ICD-10-CM | POA: Insufficient documentation

## 2017-10-25 DIAGNOSIS — R600 Localized edema: Secondary | ICD-10-CM | POA: Diagnosis not present

## 2017-10-25 DIAGNOSIS — Z87891 Personal history of nicotine dependence: Secondary | ICD-10-CM

## 2017-10-25 DIAGNOSIS — M48061 Spinal stenosis, lumbar region without neurogenic claudication: Secondary | ICD-10-CM | POA: Diagnosis not present

## 2017-10-25 DIAGNOSIS — G250 Essential tremor: Secondary | ICD-10-CM | POA: Diagnosis not present

## 2017-10-25 DIAGNOSIS — R0602 Shortness of breath: Secondary | ICD-10-CM | POA: Diagnosis not present

## 2017-10-25 DIAGNOSIS — R251 Tremor, unspecified: Secondary | ICD-10-CM | POA: Insufficient documentation

## 2017-10-25 DIAGNOSIS — R2689 Other abnormalities of gait and mobility: Secondary | ICD-10-CM | POA: Diagnosis not present

## 2017-10-25 DIAGNOSIS — J449 Chronic obstructive pulmonary disease, unspecified: Secondary | ICD-10-CM | POA: Insufficient documentation

## 2017-10-25 DIAGNOSIS — M351 Other overlap syndromes: Secondary | ICD-10-CM | POA: Diagnosis not present

## 2017-10-25 DIAGNOSIS — M545 Low back pain: Secondary | ICD-10-CM | POA: Diagnosis not present

## 2017-10-25 DIAGNOSIS — E039 Hypothyroidism, unspecified: Secondary | ICD-10-CM

## 2017-10-25 DIAGNOSIS — J9610 Chronic respiratory failure, unspecified whether with hypoxia or hypercapnia: Secondary | ICD-10-CM | POA: Diagnosis not present

## 2017-10-25 DIAGNOSIS — L97909 Non-pressure chronic ulcer of unspecified part of unspecified lower leg with unspecified severity: Secondary | ICD-10-CM | POA: Diagnosis not present

## 2017-10-25 DIAGNOSIS — I5043 Acute on chronic combined systolic (congestive) and diastolic (congestive) heart failure: Secondary | ICD-10-CM | POA: Diagnosis not present

## 2017-10-25 DIAGNOSIS — Z7401 Bed confinement status: Secondary | ICD-10-CM | POA: Diagnosis not present

## 2017-10-25 DIAGNOSIS — M2550 Pain in unspecified joint: Secondary | ICD-10-CM | POA: Diagnosis not present

## 2017-10-25 LAB — COMPREHENSIVE METABOLIC PANEL
ALBUMIN: 3.8 g/dL (ref 3.5–5.0)
ALT: 18 U/L (ref 17–63)
ANION GAP: 13 (ref 5–15)
AST: 30 U/L (ref 15–41)
Alkaline Phosphatase: 88 U/L (ref 38–126)
BUN: 16 mg/dL (ref 6–20)
CO2: 29 mmol/L (ref 22–32)
Calcium: 9.4 mg/dL (ref 8.9–10.3)
Chloride: 94 mmol/L — ABNORMAL LOW (ref 101–111)
Creatinine, Ser: 0.84 mg/dL (ref 0.61–1.24)
GFR calc Af Amer: >60 mL/min (ref >60)
GFR calc non Af Amer: >60 mL/min (ref >60)
GLUCOSE: 91 mg/dL (ref 65–99)
POTASSIUM: 3.4 mmol/L — AB (ref 3.5–5.1)
SODIUM: 136 mmol/L (ref 135–145)
Total Bilirubin: 1.2 mg/dL (ref 0.3–1.2)
Total Protein: 7.3 g/dL (ref 6.5–8.1)

## 2017-10-25 LAB — URINALYSIS, ROUTINE W REFLEX MICROSCOPIC
Bilirubin Urine: NEGATIVE
GLUCOSE, UA: NEGATIVE mg/dL
HGB URINE DIPSTICK: NEGATIVE
KETONES UR: NEGATIVE mg/dL
LEUKOCYTES UA: NEGATIVE
Nitrite: NEGATIVE
PH: 7 (ref 5.0–8.0)
Protein, ur: NEGATIVE mg/dL
Specific Gravity, Urine: 1.004 — ABNORMAL LOW (ref 1.005–1.030)

## 2017-10-25 LAB — CBC WITH DIFFERENTIAL/PLATELET
Basophils Absolute: 0 10*3/uL (ref 0.0–0.1)
Basophils Relative: 1 %
EOS ABS: 0.4 10*3/uL (ref 0.0–0.7)
EOS PCT: 5 %
HCT: 40.9 % (ref 39.0–52.0)
Hemoglobin: 13.7 g/dL (ref 13.0–17.0)
LYMPHS ABS: 1.2 10*3/uL (ref 0.7–4.0)
LYMPHS PCT: 14 %
MCH: 31 pg (ref 26.0–34.0)
MCHC: 33.5 g/dL (ref 30.0–36.0)
MCV: 92.5 fL (ref 78.0–100.0)
MONO ABS: 0.9 10*3/uL (ref 0.1–1.0)
MONOS PCT: 11 %
Neutro Abs: 5.8 10*3/uL (ref 1.7–7.7)
Neutrophils Relative %: 69 %
PLATELETS: 163 10*3/uL (ref 150–400)
RBC: 4.42 MIL/uL (ref 4.22–5.81)
RDW: 13.6 % (ref 11.5–15.5)
WBC: 8.3 10*3/uL (ref 4.0–10.5)

## 2017-10-25 LAB — I-STAT TROPONIN, ED: Troponin i, poc: 0.05 ng/mL (ref 0.00–0.08)

## 2017-10-25 LAB — BRAIN NATRIURETIC PEPTIDE: B NATRIURETIC PEPTIDE 5: 398.5 pg/mL — AB (ref 0.0–100.0)

## 2017-10-25 LAB — CBG MONITORING, ED: GLUCOSE-CAPILLARY: 89 mg/dL (ref 65–99)

## 2017-10-25 MED ORDER — HYDROCODONE-ACETAMINOPHEN 5-325 MG PO TABS: 1.0000 | ORAL_TABLET | Freq: Four times a day (QID) | ORAL | 0 refills | Status: DC | PRN

## 2017-10-25 MED ORDER — HYDROCODONE-ACETAMINOPHEN 5-325 MG PO TABS: 1.0000 | ORAL_TABLET | ORAL | 0 refills | Status: DC | PRN

## 2017-10-25 NOTE — Discharge Instructions (Signed)
You were seen in the ED today with weakness. Your labs and chest x-ray were normal. I suspect that your weakness is related to fluid retention and neuropathy. We are sending you home with a referral to home health. Call your PCP for further treatment. Return to the ED with any new or worsening symptoms.

## 2017-10-25 NOTE — Care Management Note (Signed)
Case Management Note  Patient Details  Name: Richard Rosales MRN: [id number] Date of Birth: 19-Jun-1931  Subjective/Objective:     Generalized weakness               Action/Plan:  NCM spoke to pt and wife at bedside. States he had [facility] in the past. Pt has RW, wheelchair, oxygen and CPAP at home. Contacted [facility] with new referral.   Expected Discharge Date:               Expected Discharge Plan:  [place]  In-House Referral:  NA  Discharge planning Services  CM Consult  Post Acute Care Choice:  Home Health Choice offered to:  Spouse  DME Arranged:  N/A DME Agency:  NA  HH Arranged:  RN, PT, OT, Nurse's Aide [place] Agency:  [place]  Status of Service:  Completed, signed off  If discussed at [place] of Stay Meetings, dates discussed:    Additional Comments:  Erenest Rasher, RN 10/25/2017, 2:25 PM

## 2017-10-25 NOTE — ED Notes (Signed)
Patient waiting on [facility] for transport home.  

## 2017-10-25 NOTE — ED Triage Notes (Signed)
Patient presented to ed from home with c/o generalized weakness. Pt state his leg are more weak. Pt normally walk with walker to move around the house. Patient denies any pain to the leg. Patient alert and oriented x 4. Patient use 2 L of oxygen at home PRN

## 2017-10-25 NOTE — ED Provider Notes (Signed)
Emergency Department Provider Note   I have reviewed the triage vital signs and the nursing notes.   HISTORY  Chief Complaint No chief complaint on file.   HPI Richard Rosales is a 83 y.o. male with PMH of COPD, HTN, neuropathy, chronic LE edema and anterior leg wounds, and OSA's to the emergency department for evaluation of generalized weakness in both legs.  The patient typically has tremors and difficulty with walking.  He has seen a neurologist regarding the symptoms in the distant past but nothing recently.  At home he uses a walker as needed and sometimes a wheelchair.  He lives with his elderly wife.  In the fall of last year he had a course of home health with physical therapy which improved his symptoms but symptoms have worsened gradually since that time.  This morning he had difficulty standing from the seated position and on the way up began having shaking in both legs.  He denies any unilateral weakness or numbness.  He had a mild headache which is resolved.  No fevers or chills.  Patient has chronic back pain which is not worsening today.  He states being intermittently compliant with his Lasix does not always take it at the time intervals he is supposed to.   Past Medical History:  Diagnosis Date  . Acute bronchitis   . Adenomatous colon polyp   . Allergic rhinitis   . Anemia    iron deficient  . Atrial fibrillation ([place])   . Atrial flutter ([place])   . Chronic gastritis   . COPD (chronic obstructive pulmonary disease) ([place])   . Diastolic dysfunction   . Diverticulosis of colon    recurrent GI bleeding  . DJD (degenerative joint disease)   . GERD (gastroesophageal reflux disease)   . GI bleed   . HTN (hypertension)   . Hyperplastic colon polyp 2003  . Hypothyroidism   . Moderate aortic insufficiency   . OSA (obstructive sleep apnea)    compliants w/ CPAP  . Positive PPD    remote  . Shortness of breath   . Venous insufficiency     Patient Active Problem  List   Diagnosis Date Noted  . Non-healing wound of lower extremity 10/22/2017  . Venous ulcers of both lower extremities ([place]) 08/12/2017  . Physical deconditioning 02/11/2017  . Essential tremor 12/04/2016  . MGUS (monoclonal gammopathy of unknown significance) 11/11/2016  . Neuropathy associated with MGUS ([place]) 10/07/2016  . Polyneuropathy 10/01/2016  . Gait disturbance 07/31/2016  . Aortic atherosclerosis ([place]) 06/02/2016  . Encounter for Medicare annual wellness exam 02/19/2015  . Hypertensive cardiovascular disease 03/06/2014  . Mixed hyperlipidemia 12/15/2013  . Prediabetes 12/15/2013  . Vitamin D deficiency 12/15/2013  . Medication management 12/15/2013  . DJD (degenerative joint disease)   . Allergic rhinitis   . Hypothyroidism   . Obesity, morbid ([place]) 06/13/2013  . COPD (chronic obstructive pulmonary disease) ([place]) 11/01/2009  . OSA and COPD overlap syndrome ([place]) 10/04/2009  . Chronic diastolic heart failure ([place]) 10/04/2009  . Diverticulosis of colon with hemorrhage 09/10/2009  . History of colonic polyps 09/10/2009  . Atrial fibrillation ([place]) 09/11/2008  . Essential hypertension 09/08/2008  . GERD 09/08/2008  . BPH with obstruction/lower urinary tract symptoms 09/08/2008    Past Surgical History:  Procedure Laterality Date  . CARDIAC CATHETERIZATION  7.27.05   revealed a preserved EF with no CAD  . COLONOSCOPY    . HERNIA REPAIR Bilateral   . JOINT REPLACEMENT  2001   lt total knee-rt one done 00  . NASAL SINUS SURGERY    . POLYPECTOMY     adenomatous polyps 2010  . SHOULDER ARTHROSCOPY    . TOE SURGERY Right   . TONSILLECTOMY    . TOTAL KNEE ARTHROPLASTY     bilateral    Current Outpatient Rx  . Order #: [id number] Class: Print  . Order #: [id number] Class: Historical Med  . Order #: [id number] Class: Normal  . Order #: [id number] Class: Historical Med  . Order #: [id number] Class: Normal  . Order #: [id number] Class: Historical Med  . Order #:  [id number] Class: Historical Med  . Order #: [id number] Class: Normal  . Order #: [id number] Class: Historical Med  . Order #: [id number] Class: Historical Med  . Order #: [id number] Class: Normal  . Order #: [id number] Class: Historical Med  . Order #: [id number] Class: Historical Med  . Order #: [id number] Class: Normal  . Order #: [id number] Class: Normal  . Order #: [id number] Class: Normal  . Order #: [id number] Class: Normal  . Order #: [id number] Class: Normal  . Order #: [id number] Class: Normal  . Order #: [id number] Class: Normal  . Order #: [id number] Class: Normal  . Order #: [id number] Class: Print  . Order #: [id number] Class: Historical Med  . Order #: [id number] Class: Print  . Order #: [id number] Class: Print    Allergies Ace inhibitors; Beta adrenergic blockers; Decadron [dexamethasone]; Fluticasone-salmeterol; Hytrin [terazosin]; Prednisone; and Vioxx [rofecoxib]  Family History  Problem Relation Age of Onset  . Hypertension Mother   . Diabetes Father   . Heart failure Father   . Arrhythmia Sister 59  . Lung cancer Brother        chemical    Social History Social History   Tobacco Use  . Smoking status: Former Smoker    Packs/day: 1.00    Years: 20.00    Pack years: 20.00    Types: Cigarettes    Last attempt to quit: 08/01/1978    Years since quitting: 39.2  . Smokeless tobacco: Never Used  Substance Use Topics  . Alcohol use: Yes    Alcohol/week: 0.0 oz    Comment: small glass of wine daily  . Drug use: No    Review of Systems  Constitutional: No fever/chills Eyes: No visual changes. ENT: No sore throat. Cardiovascular: Denies chest pain. Respiratory: Denies shortness of breath. Gastrointestinal: No abdominal pain.  No nausea, no vomiting.  No diarrhea.  No constipation. Genitourinary: Negative for dysuria. Musculoskeletal: Negative for back pain. Total body pain (joints) baseline.  Skin: Negative for rash. Neurological: Negative for headaches or numbness. Bilateral LE  weakness.   10-point ROS otherwise negative.  ____________________________________________   PHYSICAL EXAM:  VITAL SIGNS: ED Triage Vitals  Enc Vitals Group     BP 10/25/17 1130 117/67     Pulse Rate 10/25/17 1130 (!) 57     Resp 10/25/17 1130 12     Temp 10/25/17 1130 98.2 F (36.8 C)     Temp Source 10/25/17 1130 Oral     SpO2 10/25/17 1130 92 %     Weight 10/25/17 1130 240 lb (108.9 kg)     Height 10/25/17 1130 5\' 9"  (1.753 m)     Pain Score 10/25/17 1150 4    Constitutional: Alert and oriented. Joking with provider. Well appearing and in no acute distress. Eyes: Conjunctivae are normal. PERRL. Head: Atraumatic. Nose: No congestion/rhinnorhea. Mouth/Throat: Mucous membranes are moist.  Oropharynx non-erythematous. Neck: No stridor.   Cardiovascular: A-fib with rate controlled. Good peripheral circulation. Grossly normal heart sounds.  Respiratory: Normal respiratory effort.  No retractions. Lungs CTAB. Gastrointestinal: Soft and nontender. No distention.  Musculoskeletal: No lower extremity tenderness nor edema. No gross deformities of extremities. No focal joint swelling or redness. No midline thoracic or lumbar spine tenderness to palpation.  Neurologic:  Normal speech and language. No gross focal neurologic deficits are appreciated. Normal strength 5/5 quads and gastroc. Normal 2+ reflexes (patellar).  Skin:  Skin is warm, dry and intact. No rash noted. Psychiatric: Mood and affect are normal. Speech and behavior are normal.  ____________________________________________   LABS (all labs ordered are listed, but only abnormal results are displayed)  Labs Reviewed  COMPREHENSIVE METABOLIC PANEL - Abnormal; Notable for the following components:      Result Value   Potassium 3.4 (*)    Chloride 94 (*)    All other components within normal limits  BRAIN NATRIURETIC PEPTIDE - Abnormal; Notable for the following components:   B Natriuretic Peptide 398.5 (*)    All  other components within normal limits  URINALYSIS, ROUTINE W REFLEX MICROSCOPIC - Abnormal; Notable for the following components:   Color, Urine STRAW (*)    Specific Gravity, Urine 1.004 (*)    All other components within normal limits  URINE CULTURE  CBC WITH DIFFERENTIAL/PLATELET  CBG MONITORING, ED  I-STAT TROPONIN, ED   ____________________________________________  EKG   EKG Interpretation  Date/Time:  Sunday Oct 25 2017 11:35:20 EDT Ventricular Rate:  122 PR Interval:    QRS Duration: 220 QT Interval:  359 QTC Calculation: 532 R Axis:   -81 Text Interpretation:  Atrial fibrillation Paired ventricular premature complexes Right bundle branch block No STEMI. Similar to prior.  Confirmed by Nanda Quinton [id number]) on 10/25/2017 11:41:39 AM Also confirmed by Nanda Quinton [phone]), editor Abelardo Diesel [phone])  on 10/25/2017 12:29:00 PM       ____________________________________________  RADIOLOGY  Dg Chest 2 View  Result Date: 10/25/2017 CLINICAL DATA:  84 year old male with history of weakness. EXAM: CHEST - 2 VIEW COMPARISON:  Chest x-ray 04/07/2017. FINDINGS: In volumes are low. No consolidative airspace disease. No pleural effusions. Cephalization of the pulmonary vasculature, without frank pulmonary edema. Enlargement of the cardiopericardial silhouette, which could reflect underlying cardiomegaly and/or the presence of a pericardial effusion. Upper mediastinal contours are within normal limits. Aortic atherosclerosis. IMPRESSION: 1. Enlarged cardiopericardial silhouette, which could reflect cardiomegaly and/or presence of pericardial effusion. If, further evaluation there is clinical concern for pericardial effusion with echocardiography is suggested. 2. Pulmonary venous congestion, without frank pulmonary edema. 3. Aortic atherosclerosis. Electronically Signed   By: Vinnie Langton [name]   On: 10/25/2017 13:17     ____________________________________________   PROCEDURES  Procedure(s) performed:   Procedures  None ____________________________________________   INITIAL IMPRESSION / ASSESSMENT AND PLAN / ED COURSE  Pertinent labs & imaging results that were available during my care of the patient were reviewed by me and considered in my medical decision making (see chart for details).  Patient with bilateral lower extremity edema with tingling at the bottoms of the feet consistent with his underlying chronic medical conditions.  He has no signs or symptoms to suggest acute spinal cord emergency or to raise suspicion for Guyon Barr.  Suspect that the patient's difficulty walking is gradually progressing and chronic.  He does have significant bilateral lower extremity edema.   Reviewed labs and imaging with no acute findings. EKG unremarkable and similar to prior. Patient with cardiomegaly in prior CXR. Performed bedside ECHO to grossly assess for pericardial effusion  and no effusion identified. Patient with no PNA or pulmonary edema on CXR. He has 5/5 strength in bilateral LEs with normal neuro exam. No concern for cord compression or cauda equina. I asked case manager to assess the patient for home health and completed face-to-face. Patient has had success with this in the past and is following closely with PCP. Patient and wife are comfortable with returning home and will f/u with PCP and home health. Patient asking for additional pain medication to help sleep at night. Has taken Vicodin in the past. Will try this for breakthrough pain at night only and will be more compliant with lasix. Wife plans to call regarding wound care appointment/referral.   ____________________________________________  FINAL CLINICAL IMPRESSION(S) / ED DIAGNOSES  Final diagnoses:  Generalized weakness  Lower extremity edema    Note:  This document was prepared using Dragon voice recognition software and may  include unintentional dictation errors.  Nanda Quinton, MD Emergency Medicine    Dona Klemann, [facility], MD 10/25/17 [phone]

## 2017-10-25 NOTE — ED Notes (Signed)
Patient transported to X-ray 

## 2017-10-26 ENCOUNTER — Inpatient Hospital Stay (HOSPITAL_COMMUNITY)
Admission: EM | Admit: 2017-10-26 | Discharge: 2017-10-31 | DRG: 551 | Disposition: A | Discharge status: Skilled Nursing Facility | Payer: Medicare Other | Attending: Internal Medicine | Admitting: Internal Medicine

## 2017-10-26 ENCOUNTER — Telehealth: Payer: Self-pay | Admitting: Internal Medicine

## 2017-10-26 ENCOUNTER — Emergency Department (HOSPITAL_COMMUNITY): Payer: Medicare Other

## 2017-10-26 DIAGNOSIS — I110 Hypertensive heart disease with heart failure: Secondary | ICD-10-CM | POA: Diagnosis present

## 2017-10-26 DIAGNOSIS — Z79890 Hormone replacement therapy (postmenopausal): Secondary | ICD-10-CM

## 2017-10-26 DIAGNOSIS — I872 Venous insufficiency (chronic) (peripheral): Secondary | ICD-10-CM | POA: Diagnosis present

## 2017-10-26 DIAGNOSIS — I5023 Acute on chronic systolic (congestive) heart failure: Secondary | ICD-10-CM | POA: Diagnosis not present

## 2017-10-26 DIAGNOSIS — Z9981 Dependence on supplemental oxygen: Secondary | ICD-10-CM

## 2017-10-26 DIAGNOSIS — K219 Gastro-esophageal reflux disease without esophagitis: Secondary | ICD-10-CM

## 2017-10-26 DIAGNOSIS — L97909 Non-pressure chronic ulcer of unspecified part of unspecified lower leg with unspecified severity: Secondary | ICD-10-CM | POA: Diagnosis present

## 2017-10-26 DIAGNOSIS — G629 Polyneuropathy, unspecified: Secondary | ICD-10-CM | POA: Diagnosis present

## 2017-10-26 DIAGNOSIS — M545 Low back pain: Secondary | ICD-10-CM | POA: Diagnosis not present

## 2017-10-26 DIAGNOSIS — R2689 Other abnormalities of gait and mobility: Secondary | ICD-10-CM | POA: Diagnosis not present

## 2017-10-26 DIAGNOSIS — J449 Chronic obstructive pulmonary disease, unspecified: Secondary | ICD-10-CM

## 2017-10-26 DIAGNOSIS — Z87891 Personal history of nicotine dependence: Secondary | ICD-10-CM

## 2017-10-26 DIAGNOSIS — E876 Hypokalemia: Secondary | ICD-10-CM | POA: Diagnosis not present

## 2017-10-26 DIAGNOSIS — Z86010 Personal history of colonic polyps: Secondary | ICD-10-CM | POA: Diagnosis not present

## 2017-10-26 DIAGNOSIS — J9610 Chronic respiratory failure, unspecified whether with hypoxia or hypercapnia: Secondary | ICD-10-CM | POA: Diagnosis present

## 2017-10-26 DIAGNOSIS — Z8249 Family history of ischemic heart disease and other diseases of the circulatory system: Secondary | ICD-10-CM

## 2017-10-26 DIAGNOSIS — Z09 Encounter for follow-up examination after completed treatment for conditions other than malignant neoplasm: Secondary | ICD-10-CM

## 2017-10-26 DIAGNOSIS — M351 Other overlap syndromes: Secondary | ICD-10-CM | POA: Diagnosis present

## 2017-10-26 DIAGNOSIS — D472 Monoclonal gammopathy: Secondary | ICD-10-CM | POA: Diagnosis present

## 2017-10-26 DIAGNOSIS — R404 Transient alteration of awareness: Secondary | ICD-10-CM | POA: Diagnosis not present

## 2017-10-26 DIAGNOSIS — I5032 Chronic diastolic (congestive) heart failure: Secondary | ICD-10-CM

## 2017-10-26 DIAGNOSIS — G250 Essential tremor: Secondary | ICD-10-CM

## 2017-10-26 DIAGNOSIS — G4733 Obstructive sleep apnea (adult) (pediatric): Secondary | ICD-10-CM

## 2017-10-26 DIAGNOSIS — Z743 Need for continuous supervision: Secondary | ICD-10-CM | POA: Diagnosis not present

## 2017-10-26 DIAGNOSIS — Z833 Family history of diabetes mellitus: Secondary | ICD-10-CM

## 2017-10-26 DIAGNOSIS — R2681 Unsteadiness on feet: Secondary | ICD-10-CM | POA: Diagnosis not present

## 2017-10-26 DIAGNOSIS — M6281 Muscle weakness (generalized): Secondary | ICD-10-CM | POA: Diagnosis not present

## 2017-10-26 DIAGNOSIS — I878 Other specified disorders of veins: Secondary | ICD-10-CM | POA: Diagnosis present

## 2017-10-26 DIAGNOSIS — I5043 Acute on chronic combined systolic (congestive) and diastolic (congestive) heart failure: Secondary | ICD-10-CM | POA: Diagnosis present

## 2017-10-26 DIAGNOSIS — R1314 Dysphagia, pharyngoesophageal phase: Secondary | ICD-10-CM | POA: Diagnosis not present

## 2017-10-26 DIAGNOSIS — I351 Nonrheumatic aortic (valve) insufficiency: Secondary | ICD-10-CM | POA: Diagnosis present

## 2017-10-26 DIAGNOSIS — I6789 Other cerebrovascular disease: Secondary | ICD-10-CM | POA: Diagnosis not present

## 2017-10-26 DIAGNOSIS — I272 Pulmonary hypertension, unspecified: Secondary | ICD-10-CM | POA: Diagnosis present

## 2017-10-26 DIAGNOSIS — Z9989 Dependence on other enabling machines and devices: Secondary | ICD-10-CM

## 2017-10-26 DIAGNOSIS — I83009 Varicose veins of unspecified lower extremity with ulcer of unspecified site: Secondary | ICD-10-CM | POA: Diagnosis present

## 2017-10-26 DIAGNOSIS — R0602 Shortness of breath: Secondary | ICD-10-CM | POA: Diagnosis not present

## 2017-10-26 DIAGNOSIS — R41841 Cognitive communication deficit: Secondary | ICD-10-CM | POA: Diagnosis not present

## 2017-10-26 DIAGNOSIS — R279 Unspecified lack of coordination: Secondary | ICD-10-CM | POA: Diagnosis not present

## 2017-10-26 DIAGNOSIS — R1310 Dysphagia, unspecified: Secondary | ICD-10-CM | POA: Diagnosis present

## 2017-10-26 DIAGNOSIS — I482 Chronic atrial fibrillation: Secondary | ICD-10-CM | POA: Diagnosis not present

## 2017-10-26 DIAGNOSIS — E039 Hypothyroidism, unspecified: Secondary | ICD-10-CM

## 2017-10-26 DIAGNOSIS — Z96653 Presence of artificial knee joint, bilateral: Secondary | ICD-10-CM | POA: Diagnosis present

## 2017-10-26 DIAGNOSIS — Z7982 Long term (current) use of aspirin: Secondary | ICD-10-CM

## 2017-10-26 DIAGNOSIS — Z888 Allergy status to other drugs, medicaments and biological substances status: Secondary | ICD-10-CM

## 2017-10-26 DIAGNOSIS — Z79899 Other long term (current) drug therapy: Secondary | ICD-10-CM

## 2017-10-26 DIAGNOSIS — I352 Nonrheumatic aortic (valve) stenosis with insufficiency: Secondary | ICD-10-CM | POA: Diagnosis not present

## 2017-10-26 DIAGNOSIS — I4891 Unspecified atrial fibrillation: Secondary | ICD-10-CM | POA: Diagnosis present

## 2017-10-26 DIAGNOSIS — M48061 Spinal stenosis, lumbar region without neurogenic claudication: Principal | ICD-10-CM

## 2017-10-26 DIAGNOSIS — R69 Illness, unspecified: Secondary | ICD-10-CM

## 2017-10-26 DIAGNOSIS — Z7951 Long term (current) use of inhaled steroids: Secondary | ICD-10-CM

## 2017-10-26 DIAGNOSIS — R269 Unspecified abnormalities of gait and mobility: Secondary | ICD-10-CM

## 2017-10-26 DIAGNOSIS — R531 Weakness: Secondary | ICD-10-CM | POA: Diagnosis not present

## 2017-10-26 DIAGNOSIS — R278 Other lack of coordination: Secondary | ICD-10-CM | POA: Diagnosis not present

## 2017-10-26 DIAGNOSIS — J810 Acute pulmonary edema: Secondary | ICD-10-CM | POA: Diagnosis not present

## 2017-10-26 DIAGNOSIS — R55 Syncope and collapse: Secondary | ICD-10-CM | POA: Diagnosis not present

## 2017-10-26 LAB — BASIC METABOLIC PANEL
ANION GAP: 14 (ref 5–15)
BUN: 15 mg/dL (ref 6–20)
CALCIUM: 9.9 mg/dL (ref 8.9–10.3)
CHLORIDE: 92 mmol/L — AB (ref 101–111)
CO2: 30 mmol/L (ref 22–32)
Creatinine, Ser: 0.87 mg/dL (ref 0.61–1.24)
GFR calc Af Amer: >60 mL/min (ref >60)
GFR calc non Af Amer: >60 mL/min (ref >60)
GLUCOSE: 96 mg/dL (ref 65–99)
Potassium: 3.4 mmol/L — ABNORMAL LOW (ref 3.5–5.1)
Sodium: 136 mmol/L (ref 135–145)

## 2017-10-26 LAB — URINE CULTURE
CULTURE: NO GROWTH
SPECIAL REQUESTS: NORMAL

## 2017-10-26 LAB — I-STAT TROPONIN, ED: TROPONIN I, POC: 0.06 ng/mL (ref 0.00–0.08)

## 2017-10-26 LAB — BRAIN NATRIURETIC PEPTIDE: B Natriuretic Peptide: 413.3 pg/mL — ABNORMAL HIGH (ref 0.0–100.0)

## 2017-10-26 LAB — TSH: TSH: 1.999 u[IU]/mL (ref 0.350–4.500)

## 2017-10-26 LAB — I-STAT CG4 LACTIC ACID, ED: LACTIC ACID, VENOUS: 1.35 mmol/L (ref 0.5–1.9)

## 2017-10-26 MED ORDER — POTASSIUM CHLORIDE CRYS ER 20 MEQ PO TBCR
20.0000 meq | EXTENDED_RELEASE_TABLET | ORAL | Status: AC
  Administered 2017-10-26: 20 meq via ORAL
  Filled 2017-10-26: qty 1

## 2017-10-26 MED ORDER — TIOTROPIUM BROMIDE MONOHYDRATE 18 MCG IN CAPS
1.0000 | ORAL_CAPSULE | Freq: Every day | RESPIRATORY_TRACT | Status: DC
  Administered 2017-10-28 – 2017-10-31 (×4): 18 ug via RESPIRATORY_TRACT
  Filled 2017-10-26 (×2): qty 5

## 2017-10-26 MED ORDER — LEVOTHYROXINE SODIUM 100 MCG PO TABS
100.0000 ug | ORAL_TABLET | Freq: Every day | ORAL | Status: DC
  Administered 2017-10-27 – 2017-10-31 (×5): 100 ug via ORAL
  Filled 2017-10-26 (×5): qty 1

## 2017-10-26 MED ORDER — ALBUTEROL SULFATE (2.5 MG/3ML) 0.083% IN NEBU: 2.5000 mg | INHALATION_SOLUTION | Freq: Four times a day (QID) | RESPIRATORY_TRACT | Status: DC | PRN

## 2017-10-26 MED ORDER — MOMETASONE FURO-FORMOTEROL FUM 200-5 MCG/ACT IN AERO
2.0000 | INHALATION_SPRAY | Freq: Two times a day (BID) | RESPIRATORY_TRACT | Status: DC
  Administered 2017-10-28 – 2017-10-31 (×7): 2 via RESPIRATORY_TRACT
  Filled 2017-10-26 (×2): qty 8.8

## 2017-10-26 MED ORDER — DIPHENHYDRAMINE HCL 25 MG PO CAPS: 25.0000 mg | ORAL_CAPSULE | Freq: Three times a day (TID) | ORAL | Status: DC | PRN

## 2017-10-26 MED ORDER — MORPHINE SULFATE (PF) 4 MG/ML IV SOLN
4.0000 mg | Freq: Once | INTRAVENOUS | Status: AC
  Administered 2017-10-26: 4 mg via INTRAVENOUS
  Filled 2017-10-26: qty 1

## 2017-10-26 MED ORDER — CEPHALEXIN 500 MG PO CAPS
500.0000 mg | ORAL_CAPSULE | Freq: Three times a day (TID) | ORAL | Status: DC
  Administered 2017-10-27 – 2017-10-31 (×14): 500 mg via ORAL
  Filled 2017-10-26 (×15): qty 1

## 2017-10-26 MED ORDER — DILTIAZEM HCL ER COATED BEADS 240 MG PO CP24
240.0000 mg | ORAL_CAPSULE | Freq: Every day | ORAL | Status: DC
Start: 2017-10-27 — End: 2017-10-27
  Administered 2017-10-27: 240 mg via ORAL
  Filled 2017-10-26: qty 1

## 2017-10-26 MED ORDER — POTASSIUM CHLORIDE CRYS ER 10 MEQ PO TBCR
10.0000 meq | EXTENDED_RELEASE_TABLET | Freq: Every day | ORAL | Status: DC
  Administered 2017-10-27: 10 meq via ORAL
  Filled 2017-10-26 (×2): qty 1

## 2017-10-26 MED ORDER — AZELASTINE HCL 0.1 % NA SOLN
1.0000 | Freq: Two times a day (BID) | NASAL | Status: DC
  Administered 2017-10-27 – 2017-10-31 (×7): 1 via NASAL
  Filled 2017-10-26 (×2): qty 30

## 2017-10-26 MED ORDER — PANTOPRAZOLE SODIUM 40 MG PO TBEC
40.0000 mg | DELAYED_RELEASE_TABLET | Freq: Two times a day (BID) | ORAL | Status: DC
  Administered 2017-10-27 – 2017-10-31 (×9): 40 mg via ORAL
  Filled 2017-10-26 (×10): qty 1

## 2017-10-26 MED ORDER — ACETAMINOPHEN 650 MG RE SUPP: 650.0000 mg | Freq: Four times a day (QID) | RECTAL | Status: DC | PRN

## 2017-10-26 MED ORDER — GUAIFENESIN 200 MG PO TABS
400.0000 mg | ORAL_TABLET | Freq: Two times a day (BID) | ORAL | Status: DC
  Administered 2017-10-27 – 2017-10-31 (×8): 400 mg via ORAL
  Filled 2017-10-26 (×11): qty 2

## 2017-10-26 MED ORDER — ACETAMINOPHEN-CODEINE #3 300-30 MG PO TABS
1.0000 | ORAL_TABLET | ORAL | Status: DC | PRN
Start: 2017-10-26 — End: 2017-10-31
  Administered 2017-10-28 – 2017-10-30 (×3): 1 via ORAL
  Filled 2017-10-26 (×3): qty 1

## 2017-10-26 MED ORDER — FUROSEMIDE 40 MG PO TABS: 40.0000 mg | ORAL_TABLET | Freq: Two times a day (BID) | ORAL | Status: DC

## 2017-10-26 MED ORDER — ONDANSETRON HCL 4 MG/2ML IJ SOLN: 4.0000 mg | Freq: Four times a day (QID) | INTRAMUSCULAR | Status: DC | PRN

## 2017-10-26 MED ORDER — DEXAMETHASONE SODIUM PHOSPHATE 10 MG/ML IJ SOLN
10.0000 mg | Freq: Once | INTRAMUSCULAR | Status: AC
  Administered 2017-10-26: 10 mg via INTRAVENOUS
  Filled 2017-10-26: qty 1

## 2017-10-26 MED ORDER — ENOXAPARIN SODIUM 40 MG/0.4ML ~~LOC~~ SOLN
40.0000 mg | SUBCUTANEOUS | Status: DC
  Administered 2017-10-27 – 2017-10-31 (×4): 40 mg via SUBCUTANEOUS
  Filled 2017-10-26 (×5): qty 0.4

## 2017-10-26 MED ORDER — ACETAMINOPHEN 325 MG PO TABS: 650.0000 mg | ORAL_TABLET | Freq: Four times a day (QID) | ORAL | Status: DC | PRN

## 2017-10-26 MED ORDER — ONDANSETRON HCL 4 MG PO TABS: 4.0000 mg | ORAL_TABLET | Freq: Four times a day (QID) | ORAL | Status: DC | PRN

## 2017-10-26 MED ORDER — FUROSEMIDE 10 MG/ML IJ SOLN
40.0000 mg | Freq: Two times a day (BID) | INTRAMUSCULAR | Status: DC
  Administered 2017-10-27 (×2): 40 mg via INTRAVENOUS
  Filled 2017-10-26 (×2): qty 4

## 2017-10-26 MED ORDER — DOCUSATE SODIUM 100 MG PO CAPS
100.0000 mg | ORAL_CAPSULE | Freq: Two times a day (BID) | ORAL | Status: DC
  Administered 2017-10-27 – 2017-10-31 (×9): 100 mg via ORAL
  Filled 2017-10-26 (×10): qty 1

## 2017-10-26 MED ORDER — ASPIRIN EC 81 MG PO TBEC
81.0000 mg | DELAYED_RELEASE_TABLET | ORAL | Status: DC
  Administered 2017-10-28 – 2017-10-30 (×2): 81 mg via ORAL
  Filled 2017-10-26 (×2): qty 1

## 2017-10-26 NOTE — H&P (Signed)
History and Physical    [name] DPO:[id number] DOB: 11-04-1930 DOA: 10/26/2017  Referring MD/NP/PA: Dr. Tomasa Rand PCP: Unk Pinto, MD  Patient coming from: home  Chief Complaint: inablity to walk  I have personally briefly reviewed patient's old medical records in [facility]   HPI: Richard Rosales is a 84 y.o. male with medical history significant of HTN, A. fib, COPD on 2 L of nasal cannula oxygen, CHF, hypothyroidism, MGUS, OSA on CPAP, and venous stasis; who presents with complaints of inability to ambulate.  At baseline patient uses a walker to ambulate.  However, approximately 2 days ago he reports an acute change where he was unable to even stand up.  He reports trying to and falling back into a chair.  Family members were able to help him stand up but he was not able to pick up his legs to actually walk.  Seen in the emergency department yesterday but ultimately have been sent home with physical therapy.  However patient was unable to care for herself at all at home.  His primary care provider advised him to come back into the hospital for further treatment.  He he has these chronic venous stasis ulcerations for which she is recently been placed on cephalexin and has been taking as advised.  Patient was referred to the wound center but has not had an appointment yet.  He reports having a 60 pound weight loss after changing his diet in the last 18 months, but for the last 6 months he reports that his weight is stable.  He has also noted some worsening swelling of his lower extremities, but admits to missing some of his Lasix doses.  Denies having any significant fever, chills, change in weight, nausea, vomiting, diarrhea, or abdominal pain symptoms.  Other associated symptoms include increased shortness of breath and worsening tremor.  Patient denies any history of Parkinson's disease.   ED Course:  Upon admission into the emergency department patient was noted to have  vital signs relatively within normal limits with O2 saturations maintained on home O2 of 2 L..  Labs work was within normal limits except for potassium of 3.4 and BNP 413.3.  Chest x-ray showed enlarged cardiac silhouette with possible pericardial effusion. CT scan of the brain showed no acute abnormalities.  A MRI of the lumbar spine was performed due to patient's acute change and ability to ambulate.  Moderate to severe stenosis of L2-5 with possible symptomatic neural impingement at any or all levels noted.  Dr. Trenton Gammon of neurosurgery was consulted and recommended IV Decadron 10 mg every 6 hours.  Patient has allergy to steroids but is okay with a trial.  Review of Systems  Constitutional: Positive for malaise/fatigue. Negative for chills and fever.  HENT: Positive for hearing loss. Negative for nosebleeds.   Eyes: Negative for double vision and photophobia.  Respiratory: Positive for shortness of breath.   Cardiovascular: Positive for leg swelling. Negative for chest pain.  Gastrointestinal: Negative for abdominal pain, nausea and vomiting.  Genitourinary: Positive for frequency. Negative for dysuria.       Urinary incontinence  at baseline  Musculoskeletal: Positive for back pain and falls.  Neurological: Positive for dizziness, tremors and weakness. Negative for loss of consciousness.  Endo/Heme/Allergies: Negative for polydipsia.  Psychiatric/Behavioral: Negative for substance abuse and suicidal ideas.     Past Medical History:  Diagnosis Date  . Acute bronchitis   . Adenomatous colon polyp   . Allergic rhinitis   .  Anemia    iron deficient  . Atrial fibrillation ([place])   . Atrial flutter ([place])   . Chronic gastritis   . COPD (chronic obstructive pulmonary disease) ([place])   . Diastolic dysfunction   . Diverticulosis of colon    recurrent GI bleeding  . DJD (degenerative joint disease)   . GERD (gastroesophageal reflux disease)   . GI bleed   . HTN (hypertension)   .  Hyperplastic colon polyp 2003  . Hypothyroidism   . Moderate aortic insufficiency   . OSA (obstructive sleep apnea)    compliants w/ CPAP  . Positive PPD    remote  . Shortness of breath   . Venous insufficiency     Past Surgical History:  Procedure Laterality Date  . CARDIAC CATHETERIZATION  7.27.05   revealed a preserved EF with no CAD  . COLONOSCOPY    . HERNIA REPAIR Bilateral   . JOINT REPLACEMENT  2001   lt total knee-rt one done 00  . NASAL SINUS SURGERY    . POLYPECTOMY     adenomatous polyps 2010  . SHOULDER ARTHROSCOPY    . TOE SURGERY Right   . TONSILLECTOMY    . TOTAL KNEE ARTHROPLASTY     bilateral     reports that he quit smoking about 39 years ago. His smoking use included cigarettes. He has a 20.00 pack-year smoking history. He has never used smokeless tobacco. He reports that he drinks alcohol. He reports that he does not use drugs.  Allergies  Allergen Reactions  . Ace Inhibitors Cough  . Beta Adrenergic Blockers Other (See Comments)    bradycardia  . Decadron [Dexamethasone]     unknown  . Fluticasone-Salmeterol     Doesn't remember  . Hytrin [Terazosin]     Nasal congestion   . Prednisone     Shakes / tremors  . Vioxx [Rofecoxib] Other (See Comments)    dyspepsia    Family History  Problem Relation Age of Onset  . Hypertension Mother   . Diabetes Father   . Heart failure Father   . Arrhythmia Sister 67  . Lung cancer Brother        chemical    Prior to Admission medications   Medication Sig Start Date End Date Taking? Authorizing Provider  acetaminophen-codeine (TYLENOL #3) 300-30 MG tablet Take 1 tablet by mouth every 4 (four) hours as needed for moderate pain. 06/02/16  Yes Vicie Mutters, PA-C  aspirin 81 MG tablet Take 1 tablet 3 days per week   Yes [provider]  azelastine (ASTELIN) 0.1 % nasal spray USE 1 SPRAY IN EACH NOSTRIL TWICE A DAY 07/29/17  Yes Parrett, Tammy S, NP  b complex vitamins tablet Take 1 tablet by  mouth every morning.    Yes [provider]  cephALEXin (KEFLEX) 500 MG capsule Take 1 capsule (500 mg total) by mouth 3 (three) times daily. 10/22/17 11/21/17 Yes Liane Comber, NP  chlorpheniramine (CHLOR-TRIMETON) 4 MG tablet Take 1 tablet by mouth in the morning and 1 tablet at bedtime   Yes [provider]  Cholecalciferol (VITAMIN D) 2000 UNITS CAPS Take 2 capsules by mouth daily.    Yes [provider]  DILT-XR 240 MG 24 hr capsule TAKE 1 CAPSULE TWICE A DAY 08/24/17  Yes Allred, Jeneen Rinks, MD  docusate sodium (COLACE) 100 MG capsule Take 100 mg by mouth 2 (two) times daily.     Yes [provider]  fish oil-omega-3 fatty acids 1000  MG capsule Take 1 g by mouth 2 (two) times daily.     Yes [provider]  furosemide (LASIX) 40 MG tablet TAKE 1 TABLET TWICE A DAY FOR FLUID RETENTION 04/25/17  Yes Unk Pinto, MD  glucosamine-chondroitin 500-400 MG tablet Take 1 tablet by mouth 1 day or 1 dose.   Yes [provider]  guaifenesin (HUMIBID E) 400 MG TABS Take 400 mg by mouth 2 (two) times daily.     Yes [provider]  levothyroxine (SYNTHROID, LEVOTHROID) 100 MCG tablet TAKE 1 TABLET DAILY BEFORE BREAKFAST 06/17/17  Yes Liane Comber, NP  metolazone (ZAROXOLYN) 5 MG tablet TAKE 1 TABLET DAILY AS DIRECTED FOR FLUID AND SWELLING 04/25/17  Yes Unk Pinto, MD  omeprazole (PRILOSEC) 40 MG capsule TAKE 1 CAPSULE TWICE A DAY Patient taking differently: TAKE 1 CAPSULE (40 mg) TWICE A DAY 06/17/17  Yes Liane Comber, NP  potassium chloride (K-DUR) 10 MEQ tablet Take 1 tablet (10 mEq total) by mouth daily. 04/09/17  Yes Parrett, Fonnie Mu, NP  SPIRIVA HANDIHALER 18 MCG inhalation capsule INHALE THE CONTENTS OF 1 CAPSULE DAILY 08/18/17  Yes Collene Gobble, MD  SYMBICORT 160-4.5 MCG/ACT inhaler USE 2 INHALATIONS TWICE A DAY 10/26/17  Yes Byrum, Rose Fillers, MD  albuterol (PROVENTIL HFA;VENTOLIN HFA) 108 (90 Base) MCG/ACT inhaler Inhale 2 puffs  into the lungs every 6 (six) hours as needed for wheezing or shortness of breath. Patient not taking: Reported on 10/25/2017 08/21/15   Collene Gobble, MD  albuterol (PROVENTIL) (2.5 MG/3ML) 0.083% nebulizer solution Take 3 mLs (2.5 mg total) by nebulization every 4 (four) hours. Patient not taking: Reported on 10/25/2017 06/28/14   Collene Gobble, MD  amoxicillin (AMOXIL) 500 MG capsule as directed. (DENTAL) 10/25/14   [provider]  HYDROcodone-acetaminophen (NORCO/VICODIN) 5-325 MG tablet Take 1 tablet by mouth every 6 (six) hours as needed for severe pain. Patient not taking: Reported on 10/26/2017 10/25/17   Long, Wonda Olds, MD  HYDROcodone-acetaminophen (NORCO/VICODIN) 5-325 MG tablet Take 1 tablet by mouth every 4 (four) hours as needed for severe pain. Patient not taking: Reported on 10/26/2017 10/25/17   Long, Wonda Olds, MD  losartan (COZAAR) 100 MG tablet TAKE 1 TABLET DAILY Patient not taking: Reported on 10/26/2017 06/01/17   Unk Pinto, MD    Physical Exam:  Constitutional: Obese elderly male in NAD, calm, comfortable Vitals:   10/26/17 1559 10/26/17 2000  BP: (!) 147/80 139/71  Pulse: 75 78  Resp: 16 18  Temp: 98.4 F (36.9 C)   TempSrc: Oral   SpO2: 97% 94%   Eyes: PERRL, lids and conjunctivae normal ENMT: Patient hard of hearing.  Mucous membranes are moist. Posterior pharynx clear of any exudate or lesions.  Neck: normal, supple, no masses, no thyromegaly Respiratory: Decreased overall aeration no significant Cardiovascular: Regular rate and rhythm, no murmurs / rubs / gallops.  3+ pitting lower extremity edema. 2+ pedal pulses. No carotid bruits.  Abdomen: no tenderness, no masses palpated. No hepatosplenomegaly. Bowel sounds positive.   Musculoskeletal: clubbing present. No joint deformity upper and lower extremities. Good ROM, no contractures. Normal muscle tone.  Tiny rod protruding from left great toe . Skin: Bilateral lower extremity venous stasis with ulcers  noted of the anterior shins draining serous fluid  Neurologic: CN 2-12 grossly intact. Sensation intact, DTR normal.  Decreased strength noted of the bilateral lower extremities.  Tremor present. Psychiatric: Normal judgment and insight. Alert and oriented x 3. Normal mood.  Labs on Admission: I have personally reviewed following labs and imaging studies  CBC: Recent Labs  Lab 10/25/17 1253  WBC 8.3  NEUTROABS 5.8  HGB 13.7  HCT 40.9  MCV 92.5  PLT 381   Basic Metabolic Panel: Recent Labs  Lab 10/25/17 1253 10/26/17 1821  NA 136 136  K 3.4* 3.4*  CL 94* 92*  CO2 29 30  GLUCOSE 91 96  BUN 16 15  CREATININE 0.84 0.87  CALCIUM 9.4 9.9   GFR: Estimated Creatinine Clearance: 76.9 mL/min (by C-G formula based on SCr of 0.87 mg/dL). Liver Function Tests: Recent Labs  Lab 10/25/17 1253  AST 30  ALT 18  ALKPHOS 88  BILITOT 1.2  PROT 7.3  ALBUMIN 3.8   No results for input(s): LIPASE, AMYLASE in the last 168 hours. No results for input(s): AMMONIA in the last 168 hours. Coagulation Profile: No results for input(s): INR, PROTIME in the last 168 hours. Cardiac Enzymes: No results for input(s): CKTOTAL, CKMB, CKMBINDEX, TROPONINI in the last 168 hours. BNP (last 3 results) Recent Labs    04/07/17 1632  PROBNP 230.0*   HbA1C: No results for input(s): HGBA1C in the last 72 hours. CBG: Recent Labs  Lab 10/25/17 1148  GLUCAP 89   Lipid Profile: No results for input(s): CHOL, HDL, LDLCALC, TRIG, CHOLHDL, LDLDIRECT in the last 72 hours. Thyroid Function Tests: Recent Labs    10/26/17 1823  TSH 1.999   Anemia Panel: No results for input(s): VITAMINB12, FOLATE, FERRITIN, TIBC, IRON, RETICCTPCT in the last 72 hours. Urine analysis:    Component Value Date/Time   COLORURINE Rosales (A) 10/25/2017 1253   APPEARANCEUR CLEAR 10/25/2017 1253   LABSPEC 1.004 (L) 10/25/2017 1253   PHURINE 7.0 10/25/2017 1253   GLUCOSEU NEGATIVE 10/25/2017 1253   HGBUR NEGATIVE  10/25/2017 [place] 10/25/2017 1253   KETONESUR NEGATIVE 10/25/2017 1253   PROTEINUR NEGATIVE 10/25/2017 1253   UROBILINOGEN 1.0 11/11/2013 0125   NITRITE NEGATIVE 10/25/2017 1253   LEUKOCYTESUR NEGATIVE 10/25/2017 1253   Sepsis Labs: Recent Results (from the past 240 hour(s))  Urine culture     Status: None   Collection Time: 10/25/17 12:53 PM  Result Value Ref Range Status   Specimen Description   Final    URINE, CLEAN CATCH Performed at [facility], [place] [address] [place], [place] [address]    Special Requests   Final    Normal Performed at [facility], [place] [address] [place], [place] [address]    Culture   Final    NO GROWTH Performed at [facility], [place] [address], [place], [place] [address]    Report Status 10/26/2017 FINAL  Final     Radiological Exams on Admission: Dg Chest 2 View  Result Date: 10/25/2017 CLINICAL DATA:  84 year old male with history of weakness. EXAM: CHEST - 2 VIEW COMPARISON:  Chest x-ray 04/07/2017. FINDINGS: In volumes are low. No consolidative airspace disease. No pleural effusions. Cephalization of the pulmonary vasculature, without frank pulmonary edema. Enlargement of the cardiopericardial silhouette, which could reflect underlying cardiomegaly and/or the presence of a pericardial effusion. Upper mediastinal contours are within normal limits. Aortic atherosclerosis. IMPRESSION: 1. Enlarged cardiopericardial silhouette, which could reflect cardiomegaly and/or presence of pericardial effusion. If, further evaluation there is clinical concern for pericardial effusion with echocardiography is suggested. 2. Pulmonary venous congestion, without frank pulmonary edema. 3. Aortic atherosclerosis. Electronically Signed   By: Vinnie Langton [name]   On:  10/25/2017 13:17   Ct Head Wo Contrast  Result Date: 10/26/2017 CLINICAL DATA:  Leg weakness for 4 days. EXAM: CT HEAD WITHOUT CONTRAST  TECHNIQUE: Contiguous axial images were obtained from the base of the skull through the vertex without intravenous contrast. COMPARISON:  11/11/2013 FINDINGS: Brain: Chronic moderate small vessel ischemic disease of periventricular white matter. Age appropriate involutional changes of the brain. No hydrocephalus. No effacement of the basal cisterns. Cerebellum and brainstem are stable and intact. No intra-axial mass nor extra-axial fluid collections. No large vascular territory infarct. Chronic small lacunar infarct involving the anterior limb of the left internal capsule as before. Vascular: No hyperdense vessel or unexpected calcification. Skull: Negative for fracture or focal lesion. Sinuses/Orbits: Status post bilateral maxillary antrectomies. Minimal ethmoid and sphenoid sinus mucosal thickening. Intact orbits and globes. Other: None IMPRESSION: 1. Chronic microvascular ischemic disease and old left lacunar infarct of the anterior limb of the internal capsule. No acute intracranial abnormality. 2. Status post bilateral maxillary antrectomy. Electronically Signed   By: Ashley Royalty [name]   On: 10/26/2017 19:53   Mr Lumbar Spine Wo Contrast  Result Date: 10/26/2017 CLINICAL DATA:  Extreme low back pain. EXAM: MRI LUMBAR SPINE WITHOUT CONTRAST TECHNIQUE: Multiplanar, multisequence MR imaging of the lumbar spine was performed. No intravenous contrast was administered. COMPARISON:  MRI lumbar spine 08/05/2010. FINDINGS: There is extreme motion degradation. Study is marginally diagnostic. Segmentation: Standard. Alignment: Exaggerated lumbar lordosis. 2 mm anterolisthesis L5-S1. 2 mm compensatory retrolisthesis L3-4 L2-3 and trace L1-2. Vertebrae: No worrisome osseous lesion. Endplate reactive changes related to disc space narrowing. Conus medullaris and cauda equina: Conus extends to the L1. level. Conus and cauda equina appear normal. Paraspinal and other soft tissues: Renal cystic disease, poorly visualized. Disc  levels: L1-L2: Disc space narrowing. Central protrusion. Facet arthropathy. No definite impingement. L2-L3: 2 mm retrolisthesis. Central protrusion. Posterior element hypertrophy. Moderate stenosis. BILATERAL L2 and L3 nerve root impingement likely. L3-L4: Disc space narrowing. Central protrusion. Posterior element hypertrophy. Moderate to severe stenosis. BILATERAL L3 and L4 nerve root impingement. L4-L5: Disc space narrowing. Central protrusion. Posterior element hypertrophy. Severe stenosis. BILATERAL L4 and L5 nerve root impingement. L5-S1: 2 mm anterolisthesis. Annular bulge. Posterior element hypertrophy. No definite impingement. IMPRESSION: Marginally diagnostic study demonstrating moderate to severe stenosis throughout the L2-3 through L4-5 disc spaces related to subluxations, disc space narrowing, disc protrusions, and posterior element hypertrophy. Symptomatic neural impingement could occur at any or all of those levels. See discussion above. Suspected progression since previous MR from 2012. Electronically Signed   By: Staci Righter [name]   On: 10/26/2017 20:45    EKG: Independently reviewed.  Atrial fibrillation at 90 bpm with a lot of background artifact  Assessment/Plan Spinal stenosis with gait disturbance.  Patient has been unable to walk for the last 2 days.  MRI revealing significant spinal stenosis of L2-5 with possible neuronal impingement as a cause of patient's symptoms.  Neurosurgery was consulted and recommended IV Decadron.  Patient reports issues with worsening tremor with steroids, but okay for trial. - Admit to a Telemetry bed - Continue IV Decadron 10 mg every 6 hours as tolerated per patient - Appreciate neurosurgery consultative services, follow-up for further recommendations  - Physical therapy to eval and treat  Suspect systolic congestive heart failure exacerbation: Acute.  Patient reports having worsening lower extremity swelling and intermittent missed Lasix doses.  BNP  elevated at 413.3.  Chest x-ray showing cardiomegaly with pulmonary vascular congestion question of pericardial effusion.  Last  echocardiogram performed on 03/06/2014 revealing EF of 50 to 55% at that time. - Check daily weights - Strict I&O's - Furosemide 40 mg IV twice daily - Check echocardiogram   - May warrant consult to cardiology in a.m.  Hypokalemia: Potassium noted be 3.4 on admission. - Give 20 mEq of potassium chloride x1 dose now - Continue current home supplementation and adjust dosage if needed  Venous stasis ulcerations: Patient has been on cephalexin for treatment.  Reports being set up to see wound care center. - Continue cephalexin - Wound care consult  Atrial fibrillation: Chronic.   Appears currently rate controlled. - Continue diltiazem  COPD, chronic respiratory failure: Stable.  Patient O2 saturations maintained on 2 L of nasal cannula oxygen. - Continue Spiriva and pharmacy substitution of Dulera for Symbicort  Hypothyroidism: TSH noted to be 1.99 on admission. - Continue levothyroxine  GERD - Continue pharmacy substitution of Protonix  OSA on CPAP - Continue CPAP per RT   DVT prophylaxis: Lovenox Code Status: Full Family Communication: Discussed plan of care with the patient and family present at bedside Disposition Plan: TBD Consults called: Neurosurgery Admission status: Inpatient  Norval Morton MD [facility] Pager [phone]   If 7PM-7AM, please contact night-coverage [email or web address] Password [facility]  10/26/2017, 9:48 PM

## 2017-10-26 NOTE — ED Notes (Signed)
Bed: WA19 Expected date:  Expected time:  Means of arrival:  Comments: EMS- SOB 

## 2017-10-26 NOTE — ED Notes (Signed)
ED TO INPATIENT HANDOFF REPORT  Name/Age/Gender Richard Rosales 84 y.o. male  Code Status    Code Status Orders  (From admission, onward)        Start     Ordered   10/26/17 2209  Full code  Continuous     10/26/17 2211    Code Status History    Date Active Date Inactive Code Status Order ID Comments User Context   11/11/2013 0530 11/11/2013 1850 Full Code [id number]  Rise Patience, MD Inpatient      Home/SNF/Other Home  Chief Complaint General Weakness; SOB  Level of Care/Admitting Diagnosis ED Disposition    ED Disposition Condition [facility] Area: [place] [[id number]]  Level of Care: Telemetry [5]  Admit to tele based on following criteria: Complex arrhythmia (Bradycardia/Tachycardia)  Diagnosis: Spinal stenosis of lumbar region at multiple levels [056979]  Admitting Physician: Norval Morton [[id number]]  Attending Physician: Norval Morton [[id number]]  Estimated length of stay: past midnight tomorrow  Certification:: I certify this patient will need inpatient services for at least 2 midnights  PT Class (Do Not Modify): Inpatient [101]  PT Acc Code (Do Not Modify): Private [1]       Medical History Past Medical History:  Diagnosis Date  . Acute bronchitis   . Adenomatous colon polyp   . Allergic rhinitis   . Anemia    iron deficient  . Atrial fibrillation ([place])   . Atrial flutter ([place])   . Chronic gastritis   . COPD (chronic obstructive pulmonary disease) ([place])   . Diastolic dysfunction   . Diverticulosis of colon    recurrent GI bleeding  . DJD (degenerative joint disease)   . GERD (gastroesophageal reflux disease)   . GI bleed   . HTN (hypertension)   . Hyperplastic colon polyp 2003  . Hypothyroidism   . Moderate aortic insufficiency   . OSA (obstructive sleep apnea)    compliants w/ CPAP  . Positive PPD    remote  . Shortness of breath   . Venous insufficiency     Allergies Allergies  Allergen  Reactions  . Ace Inhibitors Cough  . Beta Adrenergic Blockers Other (See Comments)    bradycardia  . Decadron [Dexamethasone]     unknown  . Fluticasone-Salmeterol     Doesn't remember  . Hytrin [Terazosin]     Nasal congestion   . Prednisone     Shakes / tremors  . Vioxx [Rofecoxib] Other (See Comments)    dyspepsia    IV Location/Drains/Wounds Patient Lines/Drains/Airways Status   Active Line/Drains/Airways    Name:   Placement date:   Placement time:   Site:   Days:   Peripheral IV 10/26/17 Right Antecubital   10/26/17    1810    Antecubital   less than 1   Incision 06/05/11 Shoulder Right   06/05/11    1236     2335          Labs/Imaging Results for orders placed or performed during the hospital encounter of 10/26/17 (from the past 48 hour(s))  I-stat troponin, ED     Status: None   Collection Time: 10/26/17  6:14 PM  Result Value Ref Range   Troponin i, poc 0.06 0.00 - 0.08 ng/mL   Comment 3            Comment: Due to the release kinetics of cTnI, a negative result within the first hours of the onset  of symptoms does not rule out myocardial infarction with certainty. If myocardial infarction is still suspected, repeat the test at appropriate intervals.   I-Stat CG4 Lactic Acid, ED     Status: None   Collection Time: 10/26/17  6:17 PM  Result Value Ref Range   Lactic Acid, Venous 1.35 0.5 - 1.9 mmol/L  Brain natriuretic peptide     Status: Abnormal   Collection Time: 10/26/17  6:21 PM  Result Value Ref Range   B Natriuretic Peptide 413.3 (H) 0.0 - 100.0 pg/mL    Comment: Performed at [facility], [place] [address], [place], [place] [address]  Basic metabolic panel     Status: Abnormal   Collection Time: 10/26/17  6:21 PM  Result Value Ref Range   Sodium 136 135 - 145 mmol/L   Potassium 3.4 (L) 3.5 - 5.1 mmol/L   Chloride 92 (L) 101 - 111 mmol/L   CO2 30 22 - 32 mmol/L   Glucose, Bld 96 65 - 99 mg/dL   BUN 15 6 - 20 mg/dL   Creatinine,  Ser 0.87 0.61 - 1.24 mg/dL   Calcium 9.9 8.9 - 10.3 mg/dL   GFR calc non Af Amer >60 >60 mL/min   GFR calc Af Amer >60 >60 mL/min    Comment: (NOTE) The eGFR has been calculated using the CKD EPI equation. This calculation has not been validated in all clinical situations. eGFR's persistently <60 mL/min signify possible Chronic Kidney Disease.    Anion gap 14 5 - 15    Comment: Performed at [facility], [place] [address], [place], [place] [address]  TSH     Status: None   Collection Time: 10/26/17  6:23 PM  Result Value Ref Range   TSH 1.999 0.350 - 4.500 uIU/mL    Comment: Performed by a 3rd Generation assay with a functional sensitivity of <=0.01 uIU/mL. Performed at [facility], [place] [address], [place], [place] [address]    Dg Chest 2 View  Result Date: 10/25/2017 CLINICAL DATA:  84 year old male with history of weakness. EXAM: CHEST - 2 VIEW COMPARISON:  Chest x-ray 04/07/2017. FINDINGS: In volumes are low. No consolidative airspace disease. No pleural effusions. Cephalization of the pulmonary vasculature, without frank pulmonary edema. Enlargement of the cardiopericardial silhouette, which could reflect underlying cardiomegaly and/or the presence of a pericardial effusion. Upper mediastinal contours are within normal limits. Aortic atherosclerosis. IMPRESSION: 1. Enlarged cardiopericardial silhouette, which could reflect cardiomegaly and/or presence of pericardial effusion. If, further evaluation there is clinical concern for pericardial effusion with echocardiography is suggested. 2. Pulmonary venous congestion, without frank pulmonary edema. 3. Aortic atherosclerosis. Electronically Signed   By: Vinnie Langton [name]   On: 10/25/2017 13:17   Ct Head Wo Contrast  Result Date: 10/26/2017 CLINICAL DATA:  Leg weakness for 4 days. EXAM: CT HEAD WITHOUT CONTRAST TECHNIQUE: Contiguous axial images were obtained from the base of the skull through the vertex  without intravenous contrast. COMPARISON:  11/11/2013 FINDINGS: Brain: Chronic moderate small vessel ischemic disease of periventricular white matter. Age appropriate involutional changes of the brain. No hydrocephalus. No effacement of the basal cisterns. Cerebellum and brainstem are stable and intact. No intra-axial mass nor extra-axial fluid collections. No large vascular territory infarct. Chronic small lacunar infarct involving the anterior limb of the left internal capsule as before. Vascular: No hyperdense vessel or unexpected calcification. Skull: Negative for fracture or focal lesion. Sinuses/Orbits: Status post bilateral maxillary antrectomies. Minimal ethmoid and sphenoid sinus  mucosal thickening. Intact orbits and globes. Other: None IMPRESSION: 1. Chronic microvascular ischemic disease and old left lacunar infarct of the anterior limb of the internal capsule. No acute intracranial abnormality. 2. Status post bilateral maxillary antrectomy. Electronically Signed   By: Ashley Royalty [name]   On: 10/26/2017 19:53   Mr Lumbar Spine Wo Contrast  Result Date: 10/26/2017 CLINICAL DATA:  Extreme low back pain. EXAM: MRI LUMBAR SPINE WITHOUT CONTRAST TECHNIQUE: Multiplanar, multisequence MR imaging of the lumbar spine was performed. No intravenous contrast was administered. COMPARISON:  MRI lumbar spine 08/05/2010. FINDINGS: There is extreme motion degradation. Study is marginally diagnostic. Segmentation: Standard. Alignment: Exaggerated lumbar lordosis. 2 mm anterolisthesis L5-S1. 2 mm compensatory retrolisthesis L3-4 L2-3 and trace L1-2. Vertebrae: No worrisome osseous lesion. Endplate reactive changes related to disc space narrowing. Conus medullaris and cauda equina: Conus extends to the L1. level. Conus and cauda equina appear normal. Paraspinal and other soft tissues: Renal cystic disease, poorly visualized. Disc levels: L1-L2: Disc space narrowing. Central protrusion. Facet arthropathy. No definite  impingement. L2-L3: 2 mm retrolisthesis. Central protrusion. Posterior element hypertrophy. Moderate stenosis. BILATERAL L2 and L3 nerve root impingement likely. L3-L4: Disc space narrowing. Central protrusion. Posterior element hypertrophy. Moderate to severe stenosis. BILATERAL L3 and L4 nerve root impingement. L4-L5: Disc space narrowing. Central protrusion. Posterior element hypertrophy. Severe stenosis. BILATERAL L4 and L5 nerve root impingement. L5-S1: 2 mm anterolisthesis. Annular bulge. Posterior element hypertrophy. No definite impingement. IMPRESSION: Marginally diagnostic study demonstrating moderate to severe stenosis throughout the L2-3 through L4-5 disc spaces related to subluxations, disc space narrowing, disc protrusions, and posterior element hypertrophy. Symptomatic neural impingement could occur at any or all of those levels. See discussion above. Suspected progression since previous MR from 2012. Electronically Signed   By: Staci Righter [name]   On: 10/26/2017 20:45    Pending Labs Unresulted Labs (From admission, onward)   Start     Ordered   10/27/17 0500  CBC  Tomorrow morning,   R     10/26/17 2211   10/27/17 8786  Basic metabolic panel  Tomorrow morning,   R     10/26/17 2211   10/27/17 0500  Magnesium  Tomorrow morning,   R     10/26/17 2211   10/27/17 0500  QuantiFERON-TB Gold Plus  Once,   R     10/26/17 2237      Vitals/Pain Today's Vitals   10/26/17 1612 10/26/17 1956 10/26/17 2000 10/26/17 2203  BP:   139/71 (!) 136/34  Pulse:   78 87  Resp:   18 15  Temp:      TempSrc:      SpO2:   94% 99%  PainSc: 0-No pain 0-No pain      Isolation Precautions No active isolations  Medications Medications  dexamethasone (DECADRON) injection 10 mg (has no administration in time range)  potassium chloride SA (K-DUR,KLOR-CON) CR tablet 20 mEq (has no administration in time range)  acetaminophen-codeine (TYLENOL #3) 300-30 MG per tablet 1 tablet (has no administration  in time range)  azelastine (ASTELIN) 0.1 % nasal spray 1 spray (has no administration in time range)  diltiazem (DILACOR XR) 24 hr capsule 240 mg (has no administration in time range)  diphenhydrAMINE (BENADRYL) tablet 25 mg (has no administration in time range)  levothyroxine (SYNTHROID, LEVOTHROID) tablet 100 mcg (has no administration in time range)  guaifenesin (HUMIBID E) tablet 400 mg (has no administration in time range)  furosemide (LASIX) tablet 40 mg (has no  administration in time range)  potassium chloride (K-DUR) CR tablet 10 mEq (has no administration in time range)  pantoprazole (PROTONIX) EC tablet 40 mg (has no administration in time range)  tiotropium (SPIRIVA) inhalation capsule 18 mcg (has no administration in time range)  mometasone-formoterol (DULERA) 200-5 MCG/ACT inhaler 2 puff (has no administration in time range)  docusate sodium (COLACE) capsule 100 mg (has no administration in time range)  enoxaparin (LOVENOX) injection 40 mg (has no administration in time range)  ondansetron (ZOFRAN) tablet 4 mg (has no administration in time range)    Or  ondansetron (ZOFRAN) injection 4 mg (has no administration in time range)  acetaminophen (TYLENOL) tablet 650 mg (has no administration in time range)    Or  acetaminophen (TYLENOL) suppository 650 mg (has no administration in time range)  albuterol (PROVENTIL) (2.5 MG/3ML) 0.083% nebulizer solution 2.5 mg (has no administration in time range)  morphine 4 MG/ML injection 4 mg (4 mg Intravenous Given 10/26/17 1836)    Mobility non-ambulatory

## 2017-10-26 NOTE — Telephone Encounter (Signed)
Wife, patient, and Susan(daughter in law), states wife unable to care for patient in home. Caregiver and patient now feel patient would benefit from being admited to [facility], for clinical care, PT, OT, SW due to abrupt change in patient's ability to ambulate, and no longer able to perform ADL's- hygiene compromised. Confined to bed. Advanced HH was ordered in ED, but has not been in the home to evaluate yet. Per Dr Melford Aase, return to ED for evaluation of abrupt change in ability to ambulate,  compassionate care evaluation-non safe home environment, and SW evaluation admit to skilled care facility.

## 2017-10-26 NOTE — ED Provider Notes (Signed)
[facility] DEPT Provider Note   CSN: [id number] Arrival date & time: 10/26/17  1540     History   Chief Complaint Chief Complaint  Patient presents with  . Weakness  . Shortness of Breath    HPI Richard Rosales is a 84 y.o. male.  HPI Patient at baseline has some gait dysfunction.  He reports this is due to a monoclonal gammopathy (MGUS) polyneuropathy.  He uses a walker to perform ADLs.  He typically transfers about his home to go to the bathroom, bedroom and eat.  As of 2 days ago, family patient report there was a distinct change.  He reports he was too weak to walk.  Family members could help him to a standing position and patient was able to bear weight but he could not pick up his legs to actually walk.  He required complete assistance for transferring and was at risk of falling repeatedly.  Patient reports he chronically has severe back pain.  Did not note that it is not acutely changed.  Patient also has persistent fairly severe lower extremity swelling and chronic venous wounds.  Patient has not had fevers or chills.  No vomiting or diarrhea.  He wears a pull-up for urination.  He denies specifically any change in urinary function. She was seen in the emergency department yesterday for ambulatory dysfunction.  He was referred back to the emergency department today by his primary care physician office for abrupt change in ambulatory function. Past Medical History:  Diagnosis Date  . Acute bronchitis   . Adenomatous colon polyp   . Allergic rhinitis   . Anemia    iron deficient  . Atrial fibrillation ([place])   . Atrial flutter ([place])   . Chronic gastritis   . COPD (chronic obstructive pulmonary disease) ([place])   . Diastolic dysfunction   . Diverticulosis of colon    recurrent GI bleeding  . DJD (degenerative joint disease)   . GERD (gastroesophageal reflux disease)   . GI bleed   . HTN (hypertension)   . Hyperplastic colon polyp 2003  .  Hypothyroidism   . Moderate aortic insufficiency   . OSA (obstructive sleep apnea)    compliants w/ CPAP  . Positive PPD    remote  . Shortness of breath   . Venous insufficiency     Patient Active Problem List   Diagnosis Date Noted  . Non-healing wound of lower extremity 10/22/2017  . Venous ulcers of both lower extremities ([place]) 08/12/2017  . Physical deconditioning 02/11/2017  . Essential tremor 12/04/2016  . MGUS (monoclonal gammopathy of unknown significance) 11/11/2016  . Neuropathy associated with MGUS ([place]) 10/07/2016  . Polyneuropathy 10/01/2016  . Gait disturbance 07/31/2016  . Aortic atherosclerosis ([place]) 06/02/2016  . Encounter for Medicare annual wellness exam 02/19/2015  . Hypertensive cardiovascular disease 03/06/2014  . Mixed hyperlipidemia 12/15/2013  . Prediabetes 12/15/2013  . Vitamin D deficiency 12/15/2013  . Medication management 12/15/2013  . DJD (degenerative joint disease)   . Allergic rhinitis   . Hypothyroidism   . Obesity, morbid ([place]) 06/13/2013  . COPD (chronic obstructive pulmonary disease) ([place]) 11/01/2009  . OSA and COPD overlap syndrome ([place]) 10/04/2009  . Chronic diastolic heart failure ([place]) 10/04/2009  . Diverticulosis of colon with hemorrhage 09/10/2009  . History of colonic polyps 09/10/2009  . Atrial fibrillation ([place]) 09/11/2008  . Essential hypertension 09/08/2008  . GERD 09/08/2008  . BPH with obstruction/lower urinary tract symptoms 09/08/2008    Past Surgical  History:  Procedure Laterality Date  . CARDIAC CATHETERIZATION  7.27.05   revealed a preserved EF with no CAD  . COLONOSCOPY    . HERNIA REPAIR Bilateral   . JOINT REPLACEMENT  2001   lt total knee-rt one done 00  . NASAL SINUS SURGERY    . POLYPECTOMY     adenomatous polyps 2010  . SHOULDER ARTHROSCOPY    . TOE SURGERY Right   . TONSILLECTOMY    . TOTAL KNEE ARTHROPLASTY     bilateral        Home Medications    Prior to Admission medications     Medication Sig Start Date End Date Taking? Authorizing Provider  acetaminophen-codeine (TYLENOL #3) 300-30 MG tablet Take 1 tablet by mouth every 4 (four) hours as needed for moderate pain. 06/02/16  Yes Vicie Mutters, PA-C  aspirin 81 MG tablet Take 1 tablet 3 days per week   Yes [provider]  azelastine (ASTELIN) 0.1 % nasal spray USE 1 SPRAY IN EACH NOSTRIL TWICE A DAY 07/29/17  Yes Parrett, Tammy S, NP  b complex vitamins tablet Take 1 tablet by mouth every morning.    Yes [provider]  cephALEXin (KEFLEX) 500 MG capsule Take 1 capsule (500 mg total) by mouth 3 (three) times daily. 10/22/17 11/21/17 Yes Liane Comber, NP  chlorpheniramine (CHLOR-TRIMETON) 4 MG tablet Take 1 tablet by mouth in the morning and 1 tablet at bedtime   Yes [provider]  Cholecalciferol (VITAMIN D) 2000 UNITS CAPS Take 2 capsules by mouth daily.    Yes [provider]  DILT-XR 240 MG 24 hr capsule TAKE 1 CAPSULE TWICE A DAY 08/24/17  Yes Allred, Jeneen Rinks, MD  docusate sodium (COLACE) 100 MG capsule Take 100 mg by mouth 2 (two) times daily.     Yes [provider]  fish oil-omega-3 fatty acids 1000 MG capsule Take 1 g by mouth 2 (two) times daily.     Yes [provider]  furosemide (LASIX) 40 MG tablet TAKE 1 TABLET TWICE A DAY FOR FLUID RETENTION 04/25/17  Yes Unk Pinto, MD  glucosamine-chondroitin 500-400 MG tablet Take 1 tablet by mouth 1 day or 1 dose.   Yes [provider]  guaifenesin (HUMIBID E) 400 MG TABS Take 400 mg by mouth 2 (two) times daily.     Yes [provider]  levothyroxine (SYNTHROID, LEVOTHROID) 100 MCG tablet TAKE 1 TABLET DAILY BEFORE BREAKFAST 06/17/17  Yes Liane Comber, NP  metolazone (ZAROXOLYN) 5 MG tablet TAKE 1 TABLET DAILY AS DIRECTED FOR FLUID AND SWELLING 04/25/17  Yes Unk Pinto, MD  omeprazole (PRILOSEC) 40 MG capsule TAKE 1 CAPSULE TWICE A DAY Patient taking differently: TAKE 1 CAPSULE (40 mg)  TWICE A DAY 06/17/17  Yes Liane Comber, NP  potassium chloride (K-DUR) 10 MEQ tablet Take 1 tablet (10 mEq total) by mouth daily. 04/09/17  Yes Parrett, Fonnie Mu, NP  SPIRIVA HANDIHALER 18 MCG inhalation capsule INHALE THE CONTENTS OF 1 CAPSULE DAILY 08/18/17  Yes Collene Gobble, MD  SYMBICORT 160-4.5 MCG/ACT inhaler USE 2 INHALATIONS TWICE A DAY 10/26/17  Yes Byrum, Rose Fillers, MD  albuterol (PROVENTIL HFA;VENTOLIN HFA) 108 (90 Base) MCG/ACT inhaler Inhale 2 puffs into the lungs every 6 (six) hours as needed for wheezing or shortness of breath. Patient not taking: Reported on 10/25/2017 08/21/15   Collene Gobble, MD  albuterol (PROVENTIL) (2.5 MG/3ML) 0.083% nebulizer solution Take 3 mLs (2.5 mg total) by nebulization every 4 (  four) hours. Patient not taking: Reported on 10/25/2017 06/28/14   Collene Gobble, MD  amoxicillin (AMOXIL) 500 MG capsule as directed. (DENTAL) 10/25/14   [provider]  HYDROcodone-acetaminophen (NORCO/VICODIN) 5-325 MG tablet Take 1 tablet by mouth every 6 (six) hours as needed for severe pain. Patient not taking: Reported on 10/26/2017 10/25/17   Long, Wonda Olds, MD  HYDROcodone-acetaminophen (NORCO/VICODIN) 5-325 MG tablet Take 1 tablet by mouth every 4 (four) hours as needed for severe pain. Patient not taking: Reported on 10/26/2017 10/25/17   Long, Wonda Olds, MD  losartan (COZAAR) 100 MG tablet TAKE 1 TABLET DAILY Patient not taking: Reported on 10/26/2017 06/01/17   Unk Pinto, MD    Family History Family History  Problem Relation Age of Onset  . Hypertension Mother   . Diabetes Father   . Heart failure Father   . Arrhythmia Sister 7  . Lung cancer Brother        chemical    Social History Social History   Tobacco Use  . Smoking status: Former Smoker    Packs/day: 1.00    Years: 20.00    Pack years: 20.00    Types: Cigarettes    Last attempt to quit: 08/01/1978    Years since quitting: 39.2  . Smokeless tobacco: Never Used  Substance Use Topics  .  Alcohol use: Yes    Alcohol/week: 0.0 oz    Comment: small glass of wine daily  . Drug use: No     Allergies   Ace inhibitors; Beta adrenergic blockers; Decadron [dexamethasone]; Fluticasone-salmeterol; Hytrin [terazosin]; Prednisone; and Vioxx [rofecoxib]   Review of Systems Review of Systems 10 Systems reviewed and are negative for acute change except as noted in the HPI.  Physical Exam Updated Vital Signs BP 139/71 (BP Location: Left Arm)   Pulse 78   Temp 98.4 F (36.9 C) (Oral)   Resp 18   SpO2 94%   Physical Exam  Constitutional: He is oriented to person, place, and time.  Patient is alert with normal mental status.  Cognitive function very good.  No respiratory distress at rest.  HENT:  Head: Normocephalic and atraumatic.  Mouth/Throat: Oropharynx is clear and moist.  Eyes: EOM are normal.  Cardiovascular:  Irregularly irregular with occasional ectopy.  2\6 systolic ejection murmur.  Normal distal pulses.  Pulmonary/Chest:  No increased work of breathing at rest.  Soft breath sounds at the bases.  No gross wheeze rhonchi or rale.  Abdominal: Soft. He exhibits no distension. There is no tenderness. There is no guarding.  Musculoskeletal:  2-3+ pitting edema bilateral lower extremities.  Dry eschar wounds pretibial surfaces without surrounding significant erythema.  Distal pulses 2+ and symmetric.  Neurological: He is alert and oriented to person, place, and time. No cranial nerve deficit.  Patient has weakness elevating and holding lower extremities off of the bed.  He is able to temporarily hold.  He does have good plantar flexion strength.   Skin: Skin is warm and dry.  Psychiatric: He has a normal mood and affect.     ED Treatments / Results  Labs (all labs ordered are listed, but only abnormal results are displayed) Labs Reviewed  BRAIN NATRIURETIC PEPTIDE - Abnormal; Notable for the following components:      Result Value   B Natriuretic Peptide 413.3 (*)     All other components within normal limits  BASIC METABOLIC PANEL - Abnormal; Notable for the following components:   Potassium 3.4 (*)  Chloride 92 (*)    All other components within normal limits  TSH  QUANTIFERON-TB GOLD PLUS  I-STAT CG4 LACTIC ACID, ED  I-STAT TROPONIN, ED    EKG None  Radiology Dg Chest 2 View  Result Date: 10/25/2017 CLINICAL DATA:  84 year old male with history of weakness. EXAM: CHEST - 2 VIEW COMPARISON:  Chest x-ray 04/07/2017. FINDINGS: In volumes are low. No consolidative airspace disease. No pleural effusions. Cephalization of the pulmonary vasculature, without frank pulmonary edema. Enlargement of the cardiopericardial silhouette, which could reflect underlying cardiomegaly and/or the presence of a pericardial effusion. Upper mediastinal contours are within normal limits. Aortic atherosclerosis. IMPRESSION: 1. Enlarged cardiopericardial silhouette, which could reflect cardiomegaly and/or presence of pericardial effusion. If, further evaluation there is clinical concern for pericardial effusion with echocardiography is suggested. 2. Pulmonary venous congestion, without frank pulmonary edema. 3. Aortic atherosclerosis. Electronically Signed   By: Vinnie Langton [name]   On: 10/25/2017 13:17   Ct Head Wo Contrast  Result Date: 10/26/2017 CLINICAL DATA:  Leg weakness for 4 days. EXAM: CT HEAD WITHOUT CONTRAST TECHNIQUE: Contiguous axial images were obtained from the base of the skull through the vertex without intravenous contrast. COMPARISON:  11/11/2013 FINDINGS: Brain: Chronic moderate small vessel ischemic disease of periventricular white matter. Age appropriate involutional changes of the brain. No hydrocephalus. No effacement of the basal cisterns. Cerebellum and brainstem are stable and intact. No intra-axial mass nor extra-axial fluid collections. No large vascular territory infarct. Chronic small lacunar infarct involving the anterior limb of the left  internal capsule as before. Vascular: No hyperdense vessel or unexpected calcification. Skull: Negative for fracture or focal lesion. Sinuses/Orbits: Status post bilateral maxillary antrectomies. Minimal ethmoid and sphenoid sinus mucosal thickening. Intact orbits and globes. Other: None IMPRESSION: 1. Chronic microvascular ischemic disease and old left lacunar infarct of the anterior limb of the internal capsule. No acute intracranial abnormality. 2. Status post bilateral maxillary antrectomy. Electronically Signed   By: Ashley Royalty [name]   On: 10/26/2017 19:53   Mr Lumbar Spine Wo Contrast  Result Date: 10/26/2017 CLINICAL DATA:  Extreme low back pain. EXAM: MRI LUMBAR SPINE WITHOUT CONTRAST TECHNIQUE: Multiplanar, multisequence MR imaging of the lumbar spine was performed. No intravenous contrast was administered. COMPARISON:  MRI lumbar spine 08/05/2010. FINDINGS: There is extreme motion degradation. Study is marginally diagnostic. Segmentation: Standard. Alignment: Exaggerated lumbar lordosis. 2 mm anterolisthesis L5-S1. 2 mm compensatory retrolisthesis L3-4 L2-3 and trace L1-2. Vertebrae: No worrisome osseous lesion. Endplate reactive changes related to disc space narrowing. Conus medullaris and cauda equina: Conus extends to the L1. level. Conus and cauda equina appear normal. Paraspinal and other soft tissues: Renal cystic disease, poorly visualized. Disc levels: L1-L2: Disc space narrowing. Central protrusion. Facet arthropathy. No definite impingement. L2-L3: 2 mm retrolisthesis. Central protrusion. Posterior element hypertrophy. Moderate stenosis. BILATERAL L2 and L3 nerve root impingement likely. L3-L4: Disc space narrowing. Central protrusion. Posterior element hypertrophy. Moderate to severe stenosis. BILATERAL L3 and L4 nerve root impingement. L4-L5: Disc space narrowing. Central protrusion. Posterior element hypertrophy. Severe stenosis. BILATERAL L4 and L5 nerve root impingement. L5-S1: 2 mm  anterolisthesis. Annular bulge. Posterior element hypertrophy. No definite impingement. IMPRESSION: Marginally diagnostic study demonstrating moderate to severe stenosis throughout the L2-3 through L4-5 disc spaces related to subluxations, disc space narrowing, disc protrusions, and posterior element hypertrophy. Symptomatic neural impingement could occur at any or all of those levels. See discussion above. Suspected progression since previous MR from 2012. Electronically Signed   By: Staci Righter  [name]   On: 10/26/2017 20:45    Procedures Procedures (including critical care time)  Medications Ordered in ED Medications  dexamethasone (DECADRON) injection 10 mg (has no administration in time range)  morphine 4 MG/ML injection 4 mg (4 mg Intravenous Given 10/26/17 1836)     Initial Impression / Assessment and Plan / ED Course  I have reviewed the triage vital signs and the nursing notes.  Pertinent labs & imaging results that were available during my care of the patient were reviewed by me and considered in my medical decision making (see chart for details).    Consult: Reviewed with Dr. Trenton Gammon, neurosurgery. Suggest Decadron 10 mg every 6 hours.Will consult. Consult: Dr. Tamala Julian for admission.  Final Clinical Impressions(s) / ED Diagnoses   Final diagnoses:  Neurologic gait dysfunction  Essential tremor  Severe comorbid illness   Patient presents with loss of ambulatory capacity.  At baseline patient was independently using a walker to meet ADLs at home.  As of 2 days ago he was unable to do so.  No neurologic changes or cognitive changes.  No upper extremity symptoms.  MRI shows multiple levels of significant spinal stenosis.  Multiple levels of potential impingement.  She has severe chronic venous stasis with wounds.  This time, no appearance of secondary infection.  Consultation with Dr. Trenton Gammon of neurosurgery suggest Decadron.  Patient reports that his intolerance of Decadron and other  steroids is typically significant increase in his baseline tremor.  He is agreeable to trial Decadron and if significant side effects, explore other options.  Plan for admission to hospitalist service for failure of ADLs secondary to gait dysfunction with consultation to neurosurgery. ED Discharge Orders    None      Charlesetta Shanks, MD 10/26/17 2158

## 2017-10-26 NOTE — ED Triage Notes (Signed)
Per EMS-states legs weak for 4 days-states edema which has resolved-was here and discharged from ED yesterday-states family sent him back here for rehab placement

## 2017-10-26 NOTE — Clinical Social Work Note (Signed)
Clinical Social Work Assessment  Patient Details  Name: Richard Rosales MRN: [id number] Date of Birth: June 28, 1930  Date of referral:  10/26/17               Reason for consult:  Facility Placement                Permission sought to share information with:  Facility Art therapist granted to share information::  Yes, Verbal Permission Granted  Name::        Agency::     Relationship::     Contact Information:     Housing/Transportation Living arrangements for the past 2 months:    Source of Information:  Patient Patient Interpreter Needed:  None Criminal Activity/Legal Involvement Pertinent to Current Situation/Hospitalization:    Significant Relationships:  Adult Children, Spouse Lives with:    Do you feel safe going back to the place where you live?  No Need for family participation in patient care:  Yes (Comment)  Care giving concerns:  8:26 PM CSW received  CALL FROM April Coble at ph: [phone] (EMAIL: [email or web address])    WHO STATED RESPITE CARE rooms are available at either [facility] or the [facility] at the cost $125-145 a day [facility] or the [facility].  Per April at TB test would be necessary but also stated that if a chest x-ray could be completed with the words "negative for TB" written on them then that would be sufficient.    April also stated that if the pt's EDP  can do a "certain type of blood test" then that would negate the need for a skin test or a chest x-ray.  April will email the CSW with the type of blood test needed shortly.  CSW will reach April by email at  [email or web address] to send this information.  April stated that the blood test called "quantiferon gold" which would be sufficient and that the results from this, and the pt's notes from the EPD and the FL-2 will be sufficient  Family will need to speak to Janett Billow at [facility] in the morning or at D/C at ph: [phone]  to be admitted..  CSW will need send the pt's information (FL-2, notes and bloood test results) via the hub with permission of the pt/family.  Social Worker assessment / plan:  CSW met with pt/family and confirmed pt's/pt's family's plan to be discharged to SNF for rehab at discharge if pt is admitted for 3-day required (by [facility]) inpatient stay.  Pt/family have been counseled by CSW observation days do not count  CSW provided active listening and validated pt's family's concerns pt cannot safely return home.   CSW Dept was given permission to complete FL-2 and send referrals out to SNF facilities via the hub.  Pt has been living independently with his wife, prior to being admitted to [facility]. Pt can be D/C's for respite care if 3-day stay is not completed (see above).  Employment status:    Insurance information:  Medicare, Managed Care PT Recommendations:  Not assessed at this time Information / Referral to community resources:     Patient/Family's Response to care:  Patient alert and oriented.  Patient and family agreeable to plan.  Pt's family supportive and strongly involved in pt.'s care.  Pt.'s family pleasant and appreciated CSW intervention.    Patient/Family's Understanding of and Emotional Response to Diagnosis, Current Treatment, and Prognosis:  Still assessing  Emotional Assessment Appearance:    Attitude/Demeanor/Rapport:  Affect (typically observed):  Accepting, Adaptable, Hopeful, Pleasant, Stoic Orientation:  Oriented to Self, Oriented to Place, Oriented to  Time, Oriented to Situation Alcohol / Substance use:    Psych involvement (Current and /or in the community):     Discharge Needs  Concerns to be addressed:  No discharge needs identified Readmission within the last 30 days:  No Current discharge risk:  None Barriers to Discharge:  No Barriers Identified   Claudine Mouton, LCSWA 10/26/2017, 10:28 PM

## 2017-10-26 NOTE — Progress Notes (Addendum)
Consult request has been received. CSW attempting to follow up at present time.  7:15 PM CSW reviewed chart and sees pt has Medicare A&B and as such would require a 3-day inpatient hospitalization to receive authorization for a SNF stay.  8:13 PM CSW counseled pt's daughter, spouse and DIL on SNF's, and ALF's, how they work, who is qualified to go there, insurances that do and don't pay for them and how it is paid for otherwise.  CSW coubseled pt's family on respite care and how it is sometimes attained. CSW offered to call "a couple of places" to find out the average cost of respite care and to check on possible openings for the pt. Pt and family stated they were agreeable to this and stated CSW can refer pt out to all available facilities.  Pt's daughter was appreciative and thanked the CSW.  CSW called Sharyn Lull with [facility] and they are "full" but stated respite care cost $180 a night for room/board, etc.  CSW called Alfonso Ellis with [facility] at ph: [phone] who said she would call to check on openings and would call the CSW back shortly.  8:26 PM CSW received  CALL FROM April Coble at ph: [phone] (EMAIL: [email or web address])    WHO STATED RESPITE CARE rooms are available at either [facility] or the [facility] at the cost $125-145 a day [facility] or the [facility].  Per April at TB test would be necessary but also stated that if a chest x-ray could be completed with the words "negative for TB" written on them then that would be sufficient.    April also stated that if the pt's EDP  can do a "certain type of blood test" then that would negate the need for a skin test or a chest x-ray.  April will email the CSW with the type of blood test needed shortly.  CSW will reach April by email at  [email or web address] to send this information.  April stated that the blood test called "quantiferon gold" which would be sufficient and  that the results from this, and the pt's notes from the EPD and the FL-2 will be sufficient  Family will need to speak to Janett Billow at [facility] in the morning at ph: [phone].  CSW will need send the pt's information (FL-2, notes and bloood test results) via the hub with permission of the pt/family.  CSW will continue to follow for D/C needs.  9:43 PM CSW spoke to the EDP and EDP informed family pt will be admitted.  CSW provided family with the following:  If Respite care is needed in the future please ask your social worker to complete an FL-2 and send to the [facility] at discharge and family please feel free to call Janett Billow in admissions at [facility] and at [place] at ph: [phone] for all concerns and questions.  Please reconsult if future social work needs arise.  CSW signing off, as social work intervention is no longer needed.  Richard Guild. Carsyn Taubman, LCSW, LCAS, CSI Clinical Social Worker Ph: [phone]

## 2017-10-27 ENCOUNTER — Inpatient Hospital Stay (HOSPITAL_COMMUNITY): Payer: Medicare Other

## 2017-10-27 ENCOUNTER — Other Ambulatory Visit: Payer: Self-pay

## 2017-10-27 ENCOUNTER — Encounter (HOSPITAL_COMMUNITY): Payer: Self-pay | Admitting: *Deleted

## 2017-10-27 DIAGNOSIS — I5023 Acute on chronic systolic (congestive) heart failure: Secondary | ICD-10-CM

## 2017-10-27 DIAGNOSIS — E876 Hypokalemia: Secondary | ICD-10-CM | POA: Diagnosis present

## 2017-10-27 DIAGNOSIS — I352 Nonrheumatic aortic (valve) stenosis with insufficiency: Secondary | ICD-10-CM

## 2017-10-27 LAB — CBC
HCT: 43.5 % (ref 39.0–52.0)
HEMOGLOBIN: 14.7 g/dL (ref 13.0–17.0)
MCH: 31.1 pg (ref 26.0–34.0)
MCHC: 33.8 g/dL (ref 30.0–36.0)
MCV: 92 fL (ref 78.0–100.0)
Platelets: 167 10*3/uL (ref 150–400)
RBC: 4.73 MIL/uL (ref 4.22–5.81)
RDW: 13.1 % (ref 11.5–15.5)
WBC: 5.7 10*3/uL (ref 4.0–10.5)

## 2017-10-27 LAB — BASIC METABOLIC PANEL
Anion gap: 13 (ref 5–15)
BUN: 16 mg/dL (ref 6–20)
CALCIUM: 9.8 mg/dL (ref 8.9–10.3)
CO2: 29 mmol/L (ref 22–32)
CREATININE: 0.84 mg/dL (ref 0.61–1.24)
Chloride: 91 mmol/L — ABNORMAL LOW (ref 101–111)
GFR calc non Af Amer: >60 mL/min (ref >60)
Glucose, Bld: 133 mg/dL — ABNORMAL HIGH (ref 65–99)
Potassium: 3.5 mmol/L (ref 3.5–5.1)
SODIUM: 133 mmol/L — AB (ref 135–145)

## 2017-10-27 LAB — MAGNESIUM: MAGNESIUM: 1.7 mg/dL (ref 1.7–2.4)

## 2017-10-27 LAB — ECHOCARDIOGRAM COMPLETE: Weight: 3830.71 oz

## 2017-10-27 MED ORDER — AMMONIUM LACTATE 12 % EX LOTN
TOPICAL_LOTION | Freq: Every morning | CUTANEOUS | Status: DC
  Administered 2017-10-27 – 2017-10-29 (×3): via TOPICAL
  Filled 2017-10-27: qty 225

## 2017-10-27 MED ORDER — POTASSIUM CHLORIDE CRYS ER 20 MEQ PO TBCR
40.0000 meq | EXTENDED_RELEASE_TABLET | Freq: Once | ORAL | Status: AC
  Administered 2017-10-27: 40 meq via ORAL
  Filled 2017-10-27: qty 2

## 2017-10-27 MED ORDER — MAGNESIUM SULFATE 2 GM/50ML IV SOLN
2.0000 g | Freq: Once | INTRAVENOUS | Status: AC
  Administered 2017-10-27: 2 g via INTRAVENOUS
  Filled 2017-10-27: qty 50

## 2017-10-27 MED ORDER — CARVEDILOL 6.25 MG PO TABS
6.2500 mg | ORAL_TABLET | Freq: Two times a day (BID) | ORAL | Status: DC
  Administered 2017-10-27 – 2017-10-31 (×7): 6.25 mg via ORAL
  Filled 2017-10-27 (×8): qty 1

## 2017-10-27 MED ORDER — DEXAMETHASONE SODIUM PHOSPHATE 10 MG/ML IJ SOLN
10.0000 mg | Freq: Four times a day (QID) | INTRAMUSCULAR | Status: DC
  Administered 2017-10-27 – 2017-10-30 (×12): 10 mg via INTRAVENOUS
  Filled 2017-10-27 (×12): qty 1

## 2017-10-27 MED ORDER — FUROSEMIDE 10 MG/ML IJ SOLN
20.0000 mg | Freq: Once | INTRAMUSCULAR | Status: AC
  Administered 2017-10-27: 20 mg via INTRAVENOUS
  Filled 2017-10-27: qty 2

## 2017-10-27 MED ORDER — FUROSEMIDE 10 MG/ML IJ SOLN
60.0000 mg | Freq: Two times a day (BID) | INTRAMUSCULAR | Status: DC
  Administered 2017-10-27 – 2017-10-30 (×7): 60 mg via INTRAVENOUS
  Filled 2017-10-27 (×7): qty 6

## 2017-10-27 MED ORDER — LOSARTAN POTASSIUM 50 MG PO TABS
100.0000 mg | ORAL_TABLET | Freq: Every day | ORAL | Status: DC
  Administered 2017-10-27 – 2017-10-31 (×5): 100 mg via ORAL
  Filled 2017-10-27 (×5): qty 2

## 2017-10-27 NOTE — Evaluation (Signed)
Physical Therapy Evaluation Patient Details Name: Richard Rosales MRN: [id number] DOB: Nov 30, 1930 Today's Date: 10/27/2017   History of Present Illness  84 yo male admitted with spinal stenosis with possible neuronal impingement, CHF. hx of falls, A fib, COPD, CHF, OSA, DJD, balance deficits  Clinical Impression  On eval, pt required Mod assist +2 for mobility. He was able to stand x 2 with a RW for ~20 seconds each time. Noted increased shaking/tremors with activity. Pt presents with general weakness, decreased activity tolerance, and impaired gait and balance. Recommend SNF for rehab.    Follow Up Recommendations SNF    Equipment Recommendations  None recommended by PT    Recommendations for Other Services       Precautions / Restrictions Precautions Precautions: Fall Precaution Comments: O2 dep Restrictions Weight Bearing Restrictions: No      Mobility  Bed Mobility Overal bed mobility: Needs Assistance Bed Mobility: Supine to Sit     Supine to sit: HOB elevated;Mod assist;+2 for physical assistance;+2 for safety/equipment     General bed mobility comments: Assist for LEs and trunk. Increased time. Utilized bedpad for scooting, positioning  Transfers Overall transfer level: Needs assistance Equipment used: Rolling walker (2 wheeled) Transfers: Sit to/from Stand Sit to Stand: Mod assist;+2 physical assistance;+2 safety/equipment;From elevated surface         General transfer comment: Assist to rise, stabilize, control descent. VCs safety, technique, hand placement. x2 for strengthening, endurance. Noted shakiness/tremors. Pt only able to stand for ~20 seconds before needing to sit down  Ambulation/Gait             General Gait Details: NT-too weak, unsteady  Stairs            Wheelchair Mobility    Modified Rankin (Stroke Patients Only)       Balance Overall balance assessment: Needs assistance;History of Falls         Standing balance  support: Bilateral upper extremity supported Standing balance-Leahy Scale: Poor                               Pertinent Vitals/Pain Pain Assessment: Faces Pain Location: anterior thighs with activity Pain Descriptors / Indicators: Aching;Sore;Discomfort Pain Intervention(s): Limited activity within patient's tolerance;Repositioned    Home Living Family/patient expects to be discharged to:: Unsure Living Arrangements: Spouse/significant other Available Help at Discharge: Family Type of Home: House         Home Equipment: Gilford Rile - 2 wheels;Cane - single point      Prior Function Level of Independence: Independent with assistive device(s)   Gait / Transfers Assistance Needed: ambulates with RW           Hand Dominance        Extremity/Trunk Assessment   Upper Extremity Assessment Upper Extremity Assessment: Generalized weakness    Lower Extremity Assessment Lower Extremity Assessment: Generalized weakness    Cervical / Trunk Assessment Cervical / Trunk Assessment: Kyphotic  Communication   Communication: No difficulties  Cognition Arousal/Alertness: Awake/alert Behavior During Therapy: WFL for tasks assessed/performed Overall Cognitive Status: Within Functional Limits for tasks assessed                                        General Comments      Exercises     Assessment/Plan    PT Assessment Patient needs continued  PT services  PT Problem List Decreased strength;Decreased balance;Decreased mobility;Decreased activity tolerance;Decreased knowledge of use of DME       PT Treatment Interventions DME instruction;Gait training;Functional mobility training;Therapeutic activities;Balance training;Patient/family education;Therapeutic exercise    PT Goals (Current goals can be found in the Care Plan section)  Acute Rehab PT Goals Patient Stated Goal: to get strength back PT Goal Formulation: With patient/family Time For Goal  Achievement: 11/10/17 Potential to Achieve Goals: Good    Frequency Min 3X/week   Barriers to discharge        Co-evaluation               AM-PAC PT "6 Clicks" Daily Activity  Outcome Measure Difficulty turning over in bed (including adjusting bedclothes, sheets and blankets)?: Unable Difficulty moving from lying on back to sitting on the side of the bed? : Unable Difficulty sitting down on and standing up from a chair with arms (e.g., wheelchair, bedside commode, etc,.)?: Unable Help needed moving to and from a bed to chair (including a wheelchair)?: Total Help needed walking in hospital room?: Total Help needed climbing 3-5 steps with a railing? : Total 6 Click Score: 6    End of Session Equipment Utilized During Treatment: Gait belt Activity Tolerance: Patient limited by fatigue Patient left: in bed;with call bell/phone within reach;with bed alarm set;with family/visitor present   PT Visit Diagnosis: Muscle weakness (generalized) (M62.81);Difficulty in walking, not elsewhere classified (R26.2);Unsteadiness on feet (R26.81);Other abnormalities of gait and mobility (R26.89)    Time: 2500-3704 PT Time Calculation (min) (ACUTE ONLY): 23 min   Charges:   PT Evaluation $PT Eval Moderate Complexity: 1 Mod PT Treatments $Therapeutic Activity: 8-22 mins   PT G Codes:          Weston Anna, MPT Pager: [phone]

## 2017-10-27 NOTE — Progress Notes (Signed)
PROGRESS NOTE [facility]   Richard Rosales   PYK:[id number] DOB: 1930/09/11  DOA: 10/26/2017 PCP: Unk Pinto, MD   Brief Narrative:  Richard Rosales is a 84 year old male with medical history significant for CHF, hypertension, A. fib, COPD oxygen dependent, MGUS, OSA on CPAP and venous stasis presented to the emergency department complaining of inability to walk.  Patient at baseline uses a walker to ambulate but approximately 2 days prior to admission he was unable to stand up.  Also was noted to have increased in lower extremity edema after missing some Lasix doses.  Upon ED evaluation patient was noted to have normal vital signs and normal saturation on home oxygen.  His BMP was mildly elevated at 413, chest x-ray show enlarged cardiac silhouette with possible pericardial effusion.  CT of the brain with no acute abnormality and MRI of the lumbar spine shows moderate to severe stenosis of L2-L5 with possible symptomatic neural impingement.  Neurosurgery was consulted and recommended IV steroids every 6 hours and monitor for 48 to 72 hours.  Patient is admitted with working diagnosis of CHF exacerbation and spinal stenosis with neurological symptoms.  Subjective: Patient seen and examined with family at bedside.  He reports feeling slight better from yesterday able to stand up with physical therapy but having a hard time to do so.  His leg swelling is improving.  Family concerned about coughing after eating and subsequently having a hoarse voice.  Otherwise afebrile, no back pain and denies chest pain and palpitations.  Assessment & Plan: Spinal stenosis with neuronal impingement at L2 to L5 I discussed case with neurosurgery who recommended to continue IV Decadron for 48 to 72-hour in hope of pain control and weakness improvement.  If this fails will need to call back for possible surgical options.  While discussing with the patient patient would like to avoid surgical procedure as  much as possible.  He is willing to work with physical therapy in order to get better.  No urinary incontinence of decrease in sensation in lower extremity. For now continue Decadron, continue neurochecks and physical therapy.  Acute on chronic systolic CHF Clinically correlate with CHF exacerbation given lower extremity edema, elevated BNP and chest x-ray showing signs of Increasing vascular congestion.  Patient also missing doses of Lasix at home. Echocardiogram shows mild to moderate global reduction in left ventricular systolic function with EF of 40 to 45%, diffuse hypokinesis, moderate LVH and mild pulmonary hypertension. We will increase IV Lasix to 60 mg twice daily, monitor renal function closely. Strict I&O's, low-salt diet and daily weights. Given his reduction of EF will resume his losartan, switch Cardizem to beta-blocker.  Will start with 6.125 twice daily.  Not candidate for Entresto at this point.  Continue to monitor  Hypokalemia Replete, check BMP and mag in a.m.  Keep K> 4 and mag> 2  Venous stasis ulceration  Patient reported being on Keflex better wound care center.  Will continue for now Wound care recommendations appreciated  Chronic Afib  A. fib on monitor, heart rate well controlled Cardizem switched to Coreg, monitor closely.  Adjust doses as needed Not on anticoagulation as patient declined in the past.  COPD  O2 dependent, saturation stable at baseline Continue home medications.  Dysphagia SLP, modified barium  DVT prophylaxis: Lovenox Code Status: Full code Family Communication: Family at bedside Disposition Plan: TBD  Consultants:   Neurosurgery  Procedures:   Echo  Antimicrobials:  Keflex   Objective: Vitals:  10/27/17 0240 10/27/17 0546 10/27/17 0650 10/27/17 1100  BP: (!) 154/99 (!) 148/88  (!) 130/92  Pulse: 94 (!) 38 88   Resp: (!) 22 (!) 22    Temp: 97.8 F (36.6 C) 98.5 F (36.9 C)    TempSrc: Oral Oral    SpO2: 93% 97%      Weight: 108.6 kg (239 lb 6.7 oz)       Intake/Output Summary (Last 24 hours) at 10/27/2017 1350 Last data filed at 10/27/2017 0900 Gross per 24 hour  Intake 240 ml  Output 2675 ml  Net -2435 ml   Filed Weights   10/27/17 0240  Weight: 108.6 kg (239 lb 6.7 oz)    Examination:  General exam: Appears calm and comfortable  HEENT: OP moist and clear Respiratory system: Decreased air entry bilaterally, bibasilar crackles, O2 nasal cannula Cardiovascular system: S1-S2, irregularly irregular, 2/6 systolic murmur Gastrointestinal system: Abdomen is nondistended, soft and nontender. Central nervous system: Alert and oriented. No focal neurological deficits.  Normal sensation Extremities: Bilateral lower extremity edema 2+.  Symmetric, bilateral lower extremity strength 3.5/5 Skin: Pretibial ulcer from venous stasis Psychiatry: Mood & affect appropriate.    Data Reviewed: I have personally reviewed following labs and imaging studies  CBC: Recent Labs  Lab 10/25/17 1253 10/27/17 0541  WBC 8.3 5.7  NEUTROABS 5.8  --   HGB 13.7 14.7  HCT 40.9 43.5  MCV 92.5 92.0  PLT 163 027   Basic Metabolic Panel: Recent Labs  Lab 10/25/17 1253 10/26/17 1821 10/27/17 0541  NA 136 136 133*  K 3.4* 3.4* 3.5  CL 94* 92* 91*  CO2 29 30 29   GLUCOSE 91 96 133*  BUN 16 15 16   CREATININE 0.84 0.87 0.84  CALCIUM 9.4 9.9 9.8  MG  --   --  1.7   GFR: Estimated Creatinine Clearance: 79.5 mL/min (by C-G formula based on SCr of 0.84 mg/dL). Liver Function Tests: Recent Labs  Lab 10/25/17 1253  AST 30  ALT 18  ALKPHOS 88  BILITOT 1.2  PROT 7.3  ALBUMIN 3.8   No results for input(s): LIPASE, AMYLASE in the last 168 hours. No results for input(s): AMMONIA in the last 168 hours. Coagulation Profile: No results for input(s): INR, PROTIME in the last 168 hours. Cardiac Enzymes: No results for input(s): CKTOTAL, CKMB, CKMBINDEX, TROPONINI in the last 168 hours. BNP (last 3 results) Recent  Labs    04/07/17 1632  PROBNP 230.0*   HbA1C: No results for input(s): HGBA1C in the last 72 hours. CBG: Recent Labs  Lab 10/25/17 1148  GLUCAP 89   Lipid Profile: No results for input(s): CHOL, HDL, LDLCALC, TRIG, CHOLHDL, LDLDIRECT in the last 72 hours. Thyroid Function Tests: Recent Labs    10/26/17 1823  TSH 1.999   Anemia Panel: No results for input(s): VITAMINB12, FOLATE, FERRITIN, TIBC, IRON, RETICCTPCT in the last 72 hours. Sepsis Labs: Recent Labs  Lab 10/26/17 1817  LATICACIDVEN 1.35    Recent Results (from the past 240 hour(s))  Urine culture     Status: None   Collection Time: 10/25/17 12:53 PM  Result Value Ref Range Status   Specimen Description   Final    URINE, CLEAN CATCH Performed at [facility] [address] [place], [place] [address]    Special Requests   Final    Normal Performed at [facility], [place] [address], [place], [place] [address]    Culture   Final  NO GROWTH Performed at [facility], [place] [address] [place], [place] [address]    Report Status 10/26/2017 FINAL  Final      Radiology Studies: Ct Head Wo Contrast  Result Date: 10/26/2017 CLINICAL DATA:  Leg weakness for 4 days. EXAM: CT HEAD WITHOUT CONTRAST TECHNIQUE: Contiguous axial images were obtained from the base of the skull through the vertex without intravenous contrast. COMPARISON:  11/11/2013 FINDINGS: Brain: Chronic moderate small vessel ischemic disease of periventricular white matter. Age appropriate involutional changes of the brain. No hydrocephalus. No effacement of the basal cisterns. Cerebellum and brainstem are stable and intact. No intra-axial mass nor extra-axial fluid collections. No large vascular territory infarct. Chronic small lacunar infarct involving the anterior limb of the left internal capsule as before. Vascular: No hyperdense vessel or unexpected calcification. Skull: Negative for fracture or focal  lesion. Sinuses/Orbits: Status post bilateral maxillary antrectomies. Minimal ethmoid and sphenoid sinus mucosal thickening. Intact orbits and globes. Other: None IMPRESSION: 1. Chronic microvascular ischemic disease and old left lacunar infarct of the anterior limb of the internal capsule. No acute intracranial abnormality. 2. Status post bilateral maxillary antrectomy. Electronically Signed   By: Ashley Royalty [name]   On: 10/26/2017 19:53   Mr Lumbar Spine Wo Contrast  Result Date: 10/26/2017 CLINICAL DATA:  Extreme low back pain. EXAM: MRI LUMBAR SPINE WITHOUT CONTRAST TECHNIQUE: Multiplanar, multisequence MR imaging of the lumbar spine was performed. No intravenous contrast was administered. COMPARISON:  MRI lumbar spine 08/05/2010. FINDINGS: There is extreme motion degradation. Study is marginally diagnostic. Segmentation: Standard. Alignment: Exaggerated lumbar lordosis. 2 mm anterolisthesis L5-S1. 2 mm compensatory retrolisthesis L3-4 L2-3 and trace L1-2. Vertebrae: No worrisome osseous lesion. Endplate reactive changes related to disc space narrowing. Conus medullaris and cauda equina: Conus extends to the L1. level. Conus and cauda equina appear normal. Paraspinal and other soft tissues: Renal cystic disease, poorly visualized. Disc levels: L1-L2: Disc space narrowing. Central protrusion. Facet arthropathy. No definite impingement. L2-L3: 2 mm retrolisthesis. Central protrusion. Posterior element hypertrophy. Moderate stenosis. BILATERAL L2 and L3 nerve root impingement likely. L3-L4: Disc space narrowing. Central protrusion. Posterior element hypertrophy. Moderate to severe stenosis. BILATERAL L3 and L4 nerve root impingement. L4-L5: Disc space narrowing. Central protrusion. Posterior element hypertrophy. Severe stenosis. BILATERAL L4 and L5 nerve root impingement. L5-S1: 2 mm anterolisthesis. Annular bulge. Posterior element hypertrophy. No definite impingement. IMPRESSION: Marginally diagnostic study  demonstrating moderate to severe stenosis throughout the L2-3 through L4-5 disc spaces related to subluxations, disc space narrowing, disc protrusions, and posterior element hypertrophy. Symptomatic neural impingement could occur at any or all of those levels. See discussion above. Suspected progression since previous MR from 2012. Electronically Signed   By: Staci Righter [name]   On: 10/26/2017 20:45      Scheduled Meds: . [START ON 10/28/2017] aspirin EC  81 mg Oral Q M,W,F  . azelastine  1 spray Each Nare BID  . cephALEXin  500 mg Oral TID  . diltiazem  240 mg Oral Daily  . docusate sodium  100 mg Oral BID  . enoxaparin (LOVENOX) injection  40 mg Subcutaneous Q24H  . furosemide  20 mg Intravenous Once  . furosemide  40 mg Intravenous BID  . guaiFENesin  400 mg Oral BID  . levothyroxine  100 mcg Oral QAC breakfast  . mometasone-formoterol  2 puff Inhalation BID  . pantoprazole  40 mg Oral BID  . potassium chloride  10 mEq Oral Daily  . tiotropium  1 capsule  Inhalation Daily   Continuous Infusions:   LOS: 1 day    Time spent: Total of 35 minutes spent with pt, greater than 50% of which was spent in discussion of  treatment, counseling and coordination of care   Chipper Oman, MD Pager: Text Page via [email or web address]   If 7PM-7AM, please contact night-coverage [email or web address] 10/27/2017, 1:50 PM   Note - This record has been created using [employer]. Chart creation errors have been sought, but may not always have been located. Such creation errors do not reflect on the standard of medical care.

## 2017-10-27 NOTE — ED Notes (Signed)
ED TO INPATIENT HANDOFF REPORT  Name/Age/Gender Richard Rosales 84 y.o. male  Code Status    Code Status Orders  (From admission, onward)        Start     Ordered   10/26/17 2209  Full code  Continuous     10/26/17 2211    Code Status History    Date Active Date Inactive Code Status Order ID Comments User Context   11/11/2013 0530 11/11/2013 1850 Full Code [id number]  Rise Patience, MD Inpatient      Home/SNF/Other Home  Chief Complaint General Weakness; SOB  Level of Care/Admitting Diagnosis ED Disposition    ED Disposition Condition [facility] Area: [place] [[id number]]  Level of Care: Telemetry [5]  Admit to tele based on following criteria: Complex arrhythmia (Bradycardia/Tachycardia)  Diagnosis: Spinal stenosis of lumbar region at multiple levels [482707]  Admitting Physician: Norval Morton [[id number]]  Attending Physician: Norval Morton [[id number]]  Estimated length of stay: past midnight tomorrow  Certification:: I certify this patient will need inpatient services for at least 2 midnights  PT Class (Do Not Modify): Inpatient [101]  PT Acc Code (Do Not Modify): Private [1]       Medical History Past Medical History:  Diagnosis Date  . Acute bronchitis   . Adenomatous colon polyp   . Allergic rhinitis   . Anemia    iron deficient  . Atrial fibrillation ([place])   . Atrial flutter ([place])   . Chronic gastritis   . COPD (chronic obstructive pulmonary disease) ([place])   . Diastolic dysfunction   . Diverticulosis of colon    recurrent GI bleeding  . DJD (degenerative joint disease)   . GERD (gastroesophageal reflux disease)   . GI bleed   . HTN (hypertension)   . Hyperplastic colon polyp 2003  . Hypothyroidism   . Moderate aortic insufficiency   . OSA (obstructive sleep apnea)    compliants w/ CPAP  . Positive PPD    remote  . Shortness of breath   . Venous insufficiency     Allergies Allergies  Allergen  Reactions  . Ace Inhibitors Cough  . Beta Adrenergic Blockers Other (See Comments)    bradycardia  . Decadron [Dexamethasone]     unknown  . Fluticasone-Salmeterol     Doesn't remember  . Hytrin [Terazosin]     Nasal congestion   . Prednisone     Shakes / tremors  . Vioxx [Rofecoxib] Other (See Comments)    dyspepsia    IV Location/Drains/Wounds Patient Lines/Drains/Airways Status   Active Line/Drains/Airways    Name:   Placement date:   Placement time:   Site:   Days:   Peripheral IV 10/26/17 Right Antecubital   10/26/17    1810    Antecubital   1   Incision 06/05/11 Shoulder Right   06/05/11    1236     2336          Labs/Imaging Results for orders placed or performed during the hospital encounter of 10/26/17 (from the past 48 hour(s))  I-stat troponin, ED     Status: None   Collection Time: 10/26/17  6:14 PM  Result Value Ref Range   Troponin i, poc 0.06 0.00 - 0.08 ng/mL   Comment 3            Comment: Due to the release kinetics of cTnI, a negative result within the first hours of the onset of symptoms  does not rule out myocardial infarction with certainty. If myocardial infarction is still suspected, repeat the test at appropriate intervals.   I-Stat CG4 Lactic Acid, ED     Status: None   Collection Time: 10/26/17  6:17 PM  Result Value Ref Range   Lactic Acid, Venous 1.35 0.5 - 1.9 mmol/L  Brain natriuretic peptide     Status: Abnormal   Collection Time: 10/26/17  6:21 PM  Result Value Ref Range   B Natriuretic Peptide 413.3 (H) 0.0 - 100.0 pg/mL    Comment: Performed at [facility], [place] [address], [place], [place] [address]  Basic metabolic panel     Status: Abnormal   Collection Time: 10/26/17  6:21 PM  Result Value Ref Range   Sodium 136 135 - 145 mmol/L   Potassium 3.4 (L) 3.5 - 5.1 mmol/L   Chloride 92 (L) 101 - 111 mmol/L   CO2 30 22 - 32 mmol/L   Glucose, Bld 96 65 - 99 mg/dL   BUN 15 6 - 20 mg/dL   Creatinine, Ser 0.87  0.61 - 1.24 mg/dL   Calcium 9.9 8.9 - 10.3 mg/dL   GFR calc non Af Amer >60 >60 mL/min   GFR calc Af Amer >60 >60 mL/min    Comment: (NOTE) The eGFR has been calculated using the CKD EPI equation. This calculation has not been validated in all clinical situations. eGFR's persistently <60 mL/min signify possible Chronic Kidney Disease.    Anion gap 14 5 - 15    Comment: Performed at [facility], [place] [address], [place], [place] [address]  TSH     Status: None   Collection Time: 10/26/17  6:23 PM  Result Value Ref Range   TSH 1.999 0.350 - 4.500 uIU/mL    Comment: Performed by a 3rd Generation assay with a functional sensitivity of <=0.01 uIU/mL. Performed at [facility], [place] [address], [place], [place] [address]    Dg Chest 2 View  Result Date: 10/25/2017 CLINICAL DATA:  84 year old male with history of weakness. EXAM: CHEST - 2 VIEW COMPARISON:  Chest x-ray 04/07/2017. FINDINGS: In volumes are low. No consolidative airspace disease. No pleural effusions. Cephalization of the pulmonary vasculature, without frank pulmonary edema. Enlargement of the cardiopericardial silhouette, which could reflect underlying cardiomegaly and/or the presence of a pericardial effusion. Upper mediastinal contours are within normal limits. Aortic atherosclerosis. IMPRESSION: 1. Enlarged cardiopericardial silhouette, which could reflect cardiomegaly and/or presence of pericardial effusion. If, further evaluation there is clinical concern for pericardial effusion with echocardiography is suggested. 2. Pulmonary venous congestion, without frank pulmonary edema. 3. Aortic atherosclerosis. Electronically Signed   By: Vinnie Langton [name]   On: 10/25/2017 13:17   Ct Head Wo Contrast  Result Date: 10/26/2017 CLINICAL DATA:  Leg weakness for 4 days. EXAM: CT HEAD WITHOUT CONTRAST TECHNIQUE: Contiguous axial images were obtained from the base of the skull through the vertex without  intravenous contrast. COMPARISON:  11/11/2013 FINDINGS: Brain: Chronic moderate small vessel ischemic disease of periventricular white matter. Age appropriate involutional changes of the brain. No hydrocephalus. No effacement of the basal cisterns. Cerebellum and brainstem are stable and intact. No intra-axial mass nor extra-axial fluid collections. No large vascular territory infarct. Chronic small lacunar infarct involving the anterior limb of the left internal capsule as before. Vascular: No hyperdense vessel or unexpected calcification. Skull: Negative for fracture or focal lesion. Sinuses/Orbits: Status post bilateral maxillary antrectomies. Minimal ethmoid and sphenoid sinus mucosal thickening.  Intact orbits and globes. Other: None IMPRESSION: 1. Chronic microvascular ischemic disease and old left lacunar infarct of the anterior limb of the internal capsule. No acute intracranial abnormality. 2. Status post bilateral maxillary antrectomy. Electronically Signed   By: Ashley Royalty [name]   On: 10/26/2017 19:53   Mr Lumbar Spine Wo Contrast  Result Date: 10/26/2017 CLINICAL DATA:  Extreme low back pain. EXAM: MRI LUMBAR SPINE WITHOUT CONTRAST TECHNIQUE: Multiplanar, multisequence MR imaging of the lumbar spine was performed. No intravenous contrast was administered. COMPARISON:  MRI lumbar spine 08/05/2010. FINDINGS: There is extreme motion degradation. Study is marginally diagnostic. Segmentation: Standard. Alignment: Exaggerated lumbar lordosis. 2 mm anterolisthesis L5-S1. 2 mm compensatory retrolisthesis L3-4 L2-3 and trace L1-2. Vertebrae: No worrisome osseous lesion. Endplate reactive changes related to disc space narrowing. Conus medullaris and cauda equina: Conus extends to the L1. level. Conus and cauda equina appear normal. Paraspinal and other soft tissues: Renal cystic disease, poorly visualized. Disc levels: L1-L2: Disc space narrowing. Central protrusion. Facet arthropathy. No definite impingement.  L2-L3: 2 mm retrolisthesis. Central protrusion. Posterior element hypertrophy. Moderate stenosis. BILATERAL L2 and L3 nerve root impingement likely. L3-L4: Disc space narrowing. Central protrusion. Posterior element hypertrophy. Moderate to severe stenosis. BILATERAL L3 and L4 nerve root impingement. L4-L5: Disc space narrowing. Central protrusion. Posterior element hypertrophy. Severe stenosis. BILATERAL L4 and L5 nerve root impingement. L5-S1: 2 mm anterolisthesis. Annular bulge. Posterior element hypertrophy. No definite impingement. IMPRESSION: Marginally diagnostic study demonstrating moderate to severe stenosis throughout the L2-3 through L4-5 disc spaces related to subluxations, disc space narrowing, disc protrusions, and posterior element hypertrophy. Symptomatic neural impingement could occur at any or all of those levels. See discussion above. Suspected progression since previous MR from 2012. Electronically Signed   By: Staci Righter [name]   On: 10/26/2017 20:45    Pending Labs Unresulted Labs (From admission, onward)   Start     Ordered   10/27/17 0500  CBC  Tomorrow morning,   R     10/26/17 2211   10/27/17 2751  Basic metabolic panel  Tomorrow morning,   R     10/26/17 2211   10/27/17 0500  Magnesium  Tomorrow morning,   R     10/26/17 2211   10/27/17 0500  QuantiFERON-TB Gold Plus  Once,   R     10/26/17 2237      Vitals/Pain Today's Vitals   10/26/17 2203 10/26/17 2300 10/27/17 0030 10/27/17 0122  BP: (!) 136/34 (!) 142/74 (!) 142/90 (!) 147/102  Pulse: 87 82 91 89  Resp: 15 17 (!) 23 (!) 22  Temp:      TempSrc:      SpO2: 99% 97% 97% 96%  PainSc:        Isolation Precautions No active isolations  Medications Medications  acetaminophen-codeine (TYLENOL #3) 300-30 MG per tablet 1 tablet (has no administration in time range)  azelastine (ASTELIN) 0.1 % nasal spray 1 spray (has no administration in time range)  diltiazem (CARDIZEM CD) 24 hr capsule 240 mg (has no  administration in time range)  diphenhydrAMINE (BENADRYL) capsule 25 mg (has no administration in time range)  levothyroxine (SYNTHROID, LEVOTHROID) tablet 100 mcg (has no administration in time range)  guaifenesin (HUMIBID E) tablet 400 mg (has no administration in time range)  potassium chloride (K-DUR,KLOR-CON) CR tablet 10 mEq (has no administration in time range)  pantoprazole (PROTONIX) EC tablet 40 mg (has no administration in time range)  tiotropium (SPIRIVA) inhalation capsule 18 mcg (  has no administration in time range)  mometasone-formoterol (DULERA) 200-5 MCG/ACT inhaler 2 puff (0 puffs Inhalation Hold 10/26/17 2251)  docusate sodium (COLACE) capsule 100 mg (has no administration in time range)  enoxaparin (LOVENOX) injection 40 mg (has no administration in time range)  ondansetron (ZOFRAN) tablet 4 mg (has no administration in time range)    Or  ondansetron (ZOFRAN) injection 4 mg (has no administration in time range)  acetaminophen (TYLENOL) tablet 650 mg (has no administration in time range)    Or  acetaminophen (TYLENOL) suppository 650 mg (has no administration in time range)  albuterol (PROVENTIL) (2.5 MG/3ML) 0.083% nebulizer solution 2.5 mg (has no administration in time range)  furosemide (LASIX) injection 40 mg (has no administration in time range)  aspirin EC tablet 81 mg (has no administration in time range)  cephALEXin (KEFLEX) capsule 500 mg (has no administration in time range)  morphine 4 MG/ML injection 4 mg (4 mg Intravenous Given 10/26/17 1836)  dexamethasone (DECADRON) injection 10 mg (10 mg Intravenous Given 10/26/17 2250)  potassium chloride SA (K-DUR,KLOR-CON) CR tablet 20 mEq (20 mEq Oral Given 10/26/17 2250)    Mobility non-ambulatory

## 2017-10-27 NOTE — Progress Notes (Signed)
  Echocardiogram 2D Echocardiogram has been performed.  Matilde Bash 10/27/2017, 1:13 PM

## 2017-10-27 NOTE — Consult Note (Signed)
[place] Nurse wound consult note Reason for Consult: bilateral lower legs with venous insufficiency  Wound type: Dry, crusted areas along lower legs, no open areas at the time of my assessment in room 1414.  Foam dressings in place, no drainage observed on dressings or linens. Bilateral lower legs are with +4 pitting edema.   Plan:  Wash lower legs with warm soapy water, pat dry.  Apply LacHydrin lotion to bilateral lower legs. Cover shins with foam dressings.  Spiral wrap kelex from just behind the toes to just above the knees, then apply ACE wraps in the same manner.  When the wife provides the patient's compression hose, apply the hose rather than the kerlex and ace wraps. Monitor the wound area(s) for worsening of condition such as: Signs/symptoms of infection,  Increase in size,  Development of or worsening of odor, Development of pain, or increased pain at the affected locations.  Notify the medical team if any of these develop.  Thank you for the consult.  Discussed plan of care with the patient and bedside nurse.  [place] nurse will not follow at this time.  Please re-consult the [place] team if needed.  Val Riles, RN, MSN, CWOCN, CNS-BC, pager [phone]

## 2017-10-27 NOTE — Progress Notes (Signed)
Elderly male admitted with severe back pain and decreased mobility secondary to marked L3-4 and L4-5 spinal stenosis.  Patient and I would both like to avoid surgical intervention.  I recommend IV Decadron for 48 to 72 hours for hopeful pain control.  If this fails to improve his situation then please call me and I will perform a formal consult and discuss possible surgical options with the patient and his family.

## 2017-10-27 NOTE — Progress Notes (Signed)
Modified Barium Swallow Progress Note  Patient Details  Name: Richard Rosales MRN: [id number] Date of Birth: [birth year]  Today's Date: 10/27/2017  Modified Barium Swallow completed.  Full report located under Chart Review in the Imaging Section.  Brief recommendations include the following:  Clinical Impression  Patient presents with mild pharyngoesophageal phase dysphagia without aspiration and only trace penetration of sequential swallows of thin.  Decreased timing and adequacy of laryngeal closure resulted in mild penetration with thin.  Pt observed to have retained secretions in pharynx that mixed with barium.  He was sensate to mild vallecular residuals and requested liquids to clear.   Decreased UES opening noted with resultant residuals  - ? prominent CP and mild backflow of liquids to distal pharynx without sensation.  Fortunately this was minimal and pt's lateral channels provided protection.      Suspect this backflow and pt's kyphosis (known h/o GERD) may contribute to pt's coughing over intake.  Of note, pt did NOT cough througout entire MBS - however after MBS, he did cough and expectorate viscous clear - no barium - secretions.    Advised him to keep his cough/hock strong for airway protection.  Using live video, educated pt to findings/recommendations.  Will follow for dysphagia management.    Swallow Evaluation Recommendations       SLP Diet Recommendations: Regular solids;Thin liquid   Liquid Administration via: Straw   Medication Administration: Whole meds with liquid   Supervision: Patient able to self feed;Full assist for feeding   Compensations: Minimize environmental distractions;Slow rate;Small sips/bites;Other (Comment)(intermittent dry swallow, start meals with liquids, drink liquids t/o meals, small frequent meals)   Postural Changes: Remain semi-upright after after feeds/meals (Comment);Seated upright at 90 degrees   Oral Care Recommendations: Oral care  BID       Richard Rosales, [place] SLP [phone]  Macario Golds 10/27/2017,3:45 PM

## 2017-10-28 ENCOUNTER — Inpatient Hospital Stay (HOSPITAL_COMMUNITY): Payer: Medicare Other

## 2017-10-28 DIAGNOSIS — E876 Hypokalemia: Secondary | ICD-10-CM

## 2017-10-28 DIAGNOSIS — G250 Essential tremor: Secondary | ICD-10-CM

## 2017-10-28 LAB — BASIC METABOLIC PANEL
Anion gap: 15 (ref 5–15)
BUN: 28 mg/dL — ABNORMAL HIGH (ref 6–20)
CALCIUM: 9.4 mg/dL (ref 8.9–10.3)
CO2: 31 mmol/L (ref 22–32)
CREATININE: 0.99 mg/dL (ref 0.61–1.24)
Chloride: 85 mmol/L — ABNORMAL LOW (ref 101–111)
GFR calc Af Amer: >60 mL/min (ref >60)
GFR calc non Af Amer: >60 mL/min (ref >60)
GLUCOSE: 138 mg/dL — AB (ref 65–99)
Potassium: 3.3 mmol/L — ABNORMAL LOW (ref 3.5–5.1)
Sodium: 131 mmol/L — ABNORMAL LOW (ref 135–145)

## 2017-10-28 LAB — MAGNESIUM: Magnesium: 1.9 mg/dL (ref 1.7–2.4)

## 2017-10-28 MED ORDER — ALUM & MAG HYDROXIDE-SIMETH 200-200-20 MG/5ML PO SUSP: 15.0000 mL | Freq: Four times a day (QID) | ORAL | Status: DC | PRN

## 2017-10-28 MED ORDER — POTASSIUM CHLORIDE CRYS ER 20 MEQ PO TBCR
40.0000 meq | EXTENDED_RELEASE_TABLET | Freq: Two times a day (BID) | ORAL | Status: AC
  Administered 2017-10-28 (×2): 40 meq via ORAL
  Filled 2017-10-28: qty 2

## 2017-10-28 NOTE — NC FL2 (Signed)
[place] LEVEL OF CARE SCREENING TOOL     IDENTIFICATION  Patient Name: Richard Rosales Birthdate: 09-21-1930 Sex: male Admission Date (Current Location): 10/26/2017  [facility] and [place] Number:  Herbalist and Address:  [facility],  [place] [address], [place]      Provider Number: [id number]  Attending Physician Name and Address:  Hosie Poisson, MD  Relative Name and Phone Number:       Current Level of Care: Hospital Recommended Level of Care: [place] Prior Approval Number:    Date Approved/Denied:   PASRR Number: [id number]  Discharge Plan: SNF    Current Diagnoses: Patient Active Problem List   Diagnosis Date Noted  . Hypokalemia 10/27/2017  . Spinal stenosis of lumbar region at multiple levels 10/26/2017  . Non-healing wound of lower extremity 10/22/2017  . Venous ulcers of both lower extremities ([place]) 08/12/2017  . Physical deconditioning 02/11/2017  . Essential tremor 12/04/2016  . MGUS (monoclonal gammopathy of unknown significance) 11/11/2016  . 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 Polyneuropathy 10/01/2016  . Gait disturbance 07/31/2016  . Aortic atherosclerosis ([place]) 06/02/2016  . Encounter for Medicare annual wellness exam 02/19/2015  . Hypertensive cardiovascular disease 03/06/2014  . Mixed hyperlipidemia 12/15/2013  . Prediabetes 12/15/2013  . Vitamin D deficiency 12/15/2013  . Medication management 12/15/2013  . DJD (degenerative joint disease)   . Allergic rhinitis   . Hypothyroidism   . Obesity, morbid ([place]) 06/13/2013  . COPD (chronic obstructive pulmonary disease) ([place]) 11/01/2009  . OSA and COPD overlap syndrome ([place]) 10/04/2009  . Chronic diastolic heart failure ([place]) 10/04/2009  . Diverticulosis of colon with hemorrhage 09/10/2009  . History of colonic polyps 09/10/2009  . Atrial fibrillation ([place]) 09/11/2008  . Essential hypertension 09/08/2008  . GERD  (gastroesophageal reflux disease) 09/08/2008  . BPH with obstruction/lower urinary tract symptoms 09/08/2008    Orientation RESPIRATION BLADDER Height & Weight     Self, Time, Situation, Place  O2(CPAP Adult Large Full face Mask 3lpm) Incontinent Weight: 233 lb 14.5 oz (106.1 kg) Height:  5\' 9"  (175.3 cm)  BEHAVIORAL SYMPTOMS/MOOD NEUROLOGICAL BOWEL NUTRITION STATUS        Diet(see dc summary)  AMBULATORY STATUS COMMUNICATION OF NEEDS Skin   Extensive Assist Verbally Other (Comment)( Wound/Incision(OpenorDehisced)05/07/19VenousstasisulcerLegRight;Left  Gauze Dressing)                       Personal Care Assistance Level of Assistance  Bathing, Feeding, Dressing Bathing Assistance: Maximum assistance Feeding assistance: Independent Dressing Assistance: Maximum assistance     Functional Limitations Info  Sight, Hearing, Speech Sight Info: Adequate Hearing Info: Adequate Speech Info: Adequate    SPECIAL CARE FACTORS FREQUENCY  PT (By licensed PT), OT (By licensed OT)     PT Frequency: 5x/week OT Frequency: 5x/week            Contractures      Additional Factors Info  Code Status, Allergies Code Status Info: Full Code Allergies Info: Decadron Dexamethasone;Prednisone;Ace Inhibitors;Beta Adrenergic Blockers;Fluticasone-salmeterol;Hytrin Terazosin;Vioxx Rofecoxib           Current Medications (10/28/2017):  This is the current hospital active medication list Current Facility-Administered Medications  Medication Dose Route Frequency Provider Last Rate Last Dose  . acetaminophen (TYLENOL) tablet 650 mg  650 mg Oral Q6H PRN Fuller Plan A, MD       Or  . acetaminophen (TYLENOL) suppository 650 mg  650 mg Rectal Q6H PRN Tamala Julian,  Rondell A, MD      . acetaminophen-codeine (TYLENOL #3) 300-30 MG per tablet 1 tablet  1 tablet Oral Q4H PRN Smith, Rondell A, MD      . albuterol (PROVENTIL) (2.5 MG/3ML) 0.083% nebulizer solution 2.5 mg  2.5 mg Nebulization Q6H  PRN Smith, Rondell A, MD      . alum & mag hydroxide-simeth (MAALOX/MYLANTA) 200-200-20 MG/5ML suspension 15 mL  15 mL Oral Q6H PRN Hosie Poisson, MD      . ammonium lactate (LAC-HYDRIN) 12 % lotion   Topical q morning - 10a Patrecia Pour, Christean Grief, MD      . aspirin EC tablet 81 mg  81 mg Oral Q M,W,F Fuller Plan A, MD   81 mg at 10/28/17 0958  . azelastine (ASTELIN) 0.1 % nasal spray 1 spray  1 spray Each Nare BID Fuller Plan A, MD   1 spray at 10/28/17 0959  . carvedilol (COREG) tablet 6.25 mg  6.25 mg Oral BID WC Patrecia Pour, Christean Grief, MD   6.25 mg at 10/28/17 0958  . cephALEXin (KEFLEX) capsule 500 mg  500 mg Oral TID Fuller Plan A, MD   500 mg at 10/28/17 0957  . dexamethasone (DECADRON) injection 10 mg  10 mg Intravenous Q6H Patrecia Pour, Christean Grief, MD   10 mg at 10/28/17 0959  . diphenhydrAMINE (BENADRYL) capsule 25 mg  25 mg Oral Q8H PRN Smith, Rondell A, MD      . docusate sodium (COLACE) capsule 100 mg  100 mg Oral BID Tamala Julian, Rondell A, MD   100 mg at 10/28/17 0957  . enoxaparin (LOVENOX) injection 40 mg  40 mg Subcutaneous Q24H Smith, Rondell A, MD   40 mg at 10/28/17 0956  . furosemide (LASIX) injection 60 mg  60 mg Intravenous BID Patrecia Pour, Christean Grief, MD   60 mg at 10/28/17 0959  . guaiFENesin tablet 400 mg  400 mg Oral BID Fuller Plan A, MD   400 mg at 10/28/17 0957  . levothyroxine (SYNTHROID, LEVOTHROID) tablet 100 mcg  100 mcg Oral QAC breakfast Fuller Plan A, MD   100 mcg at 10/28/17 0959  . losartan (COZAAR) tablet 100 mg  100 mg Oral Daily Patrecia Pour, Christean Grief, MD   100 mg at 10/28/17 1308  . mometasone-formoterol (DULERA) 200-5 MCG/ACT inhaler 2 puff  2 puff Inhalation BID Norval Morton, MD   Stopped at 10/26/17 2251  . ondansetron (ZOFRAN) tablet 4 mg  4 mg Oral Q6H PRN Fuller Plan A, MD       Or  . ondansetron (ZOFRAN) injection 4 mg  4 mg Intravenous Q6H PRN Smith, Rondell A, MD      . pantoprazole (PROTONIX) EC tablet 40 mg  40 mg Oral BID Fuller Plan A, MD    40 mg at 10/28/17 0957  . potassium chloride SA (K-DUR,KLOR-CON) CR tablet 40 mEq  40 mEq Oral BID Hosie Poisson, MD   40 mEq at 10/28/17 0958  . tiotropium (SPIRIVA) inhalation capsule 18 mcg  1 capsule Inhalation Daily Fuller Plan A, MD   18 mcg at 10/28/17 1126     Discharge Medications: Please see discharge summary for a list of discharge medications.  Relevant Imaging Results:  Relevant Lab Results:   Additional Information SSN [id number]  Burnis Medin, LCSW

## 2017-10-28 NOTE — Consult Note (Signed)
[place] Nurse wound consult note Spouse brought patient's compression hose in.  I spoke with the primary RN and explained that when she does the wound care to his legs today, she can apply the compression hose over the foam dressings rather than the Ace wraps.  I also told her the patient was requesting "tums for an acid stomach".  Val Riles, RN, MSN, CWOCN, CNS-BC, pager [phone]

## 2017-10-28 NOTE — Progress Notes (Signed)
PROGRESS NOTE    SAMEUL Rosales  XNA:[id number] DOB: 09-Jan-1931 DOA: 10/26/2017 PCP: Unk Pinto, MD    Brief Narrative: Richard Rosales is a 84 year old male with medical history significant for CHF, hypertension, A. fib, COPD oxygen dependent, MGUS, OSA on CPAP and venous stasis presented to the emergency department complaining of inability to walk.  Patient at baseline uses a walker to ambulate but approximately 2 days prior to admission he was unable to stand up.  Also was noted to have increased in lower extremity edema after missing some Lasix doses.  Upon ED evaluation patient was noted to have normal vital signs and normal saturation on home oxygen.  His BMP was mildly elevated at 413, chest x-ray show enlarged cardiac silhouette with possible pericardial effusion.  CT of the brain with no acute abnormality and MRI of the lumbar spine shows moderate to severe stenosis of L2-L5 with possible symptomatic neural impingement.  Neurosurgery was consulted and recommended IV steroids every 6 hours and monitor for 48 to 72 hours.  Patient is admitted with working diagnosis of CHF exacerbation and spinal stenosis with neurological symptoms.    Assessment & Plan:   Principal Problem:   Spinal stenosis of lumbar region at multiple levels Active Problems:   OSA and COPD overlap syndrome (HCC)   Atrial fibrillation (HCC)   Chronic diastolic heart failure (HCC)   COPD (chronic obstructive pulmonary disease) (HCC)   GERD (gastroesophageal reflux disease)   Hypothyroidism   Hypokalemia   Spinal stenosis with nerve impingement at L2 to L5. Neurosurgery consulted, recommended atleast 48 hours of IV decadron to check for improvement in the pain. If no improvement they will look in to the surgical options.  PT evaluation.  No new neurological deficits.    Acute onchronic systolic heart failure.  Admitted to telemetry.  Echo reviewed.  Resume IV lasix.  Strict intake and output.  Resume  losartan.  Resume coreg.  Watch creatinine and potassium while on lasix.     Hypokalemia replaced.   Venous statis ulceration:  Keflex to complete the course.  Appreciate wound care consult .    Chronic atrial fib: Rate controlled.  Pt declined anti coagulation.    COPD: No wheezing.    Dysphagia: slp evaluation.       DVT prophylaxis: lovenox.  Code Status: full code.  Family Communication: none at bedside.  Disposition Plan: pending resolution of the respiratory failure and resolution of the back pain.   Consultants:   Neuro surgery.    Procedures: MRI LS spine.    Antimicrobials: none.    Subjective: Reports pt still has back pain. On 0.5lit of [place] oxygen.   Objective: Vitals:   10/28/17 0441 10/28/17 1129 10/28/17 1131 10/28/17 1325  BP: 99/85   (!) 104/52  Pulse: 97   (!) 50  Resp: (!) 22   16  Temp: (!) 97.3 F (36.3 C)   97.6 F (36.4 C)  TempSrc: Oral   Oral  SpO2: 96% 97% 97%   Weight: 106.1 kg (233 lb 14.5 oz)     Height: 5\' 9"  (1.753 m)       Intake/Output Summary (Last 24 hours) at 10/28/2017 1438 Last data filed at 10/28/2017 1159 Gross per 24 hour  Intake 600 ml  Output 3400 ml  Net -2800 ml   Filed Weights   10/27/17 0240 10/28/17 0441  Weight: 108.6 kg (239 lb 6.7 oz) 106.1 kg (233 lb 14.5 oz)    Examination:  General exam: Appears calm and comfortable  Respiratory system: Clear to auscultation. Respiratory effort normal. Cardiovascular system: S1 & S2 heard, RRR. No JVD, murmurs, rubs, gallops or clicks. No pedal edema. Gastrointestinal system: Abdomen is nondistended, soft and nontender. No organomegaly or masses felt. Normal bowel sounds heard. Central nervous system: Alert and oriented. No focal neurological deficits. Extremities: Symmetric 5 x 5 power. Skin: No rashes, lesions or ulcers Psychiatry: Judgement and insight appear normal. Mood & affect appropriate.     Data Reviewed: I have personally reviewed  following labs and imaging studies  CBC: Recent Labs  Lab 10/25/17 1253 10/27/17 0541  WBC 8.3 5.7  NEUTROABS 5.8  --   HGB 13.7 14.7  HCT 40.9 43.5  MCV 92.5 92.0  PLT 163 702   Basic Metabolic Panel: Recent Labs  Lab 10/25/17 1253 10/26/17 1821 10/27/17 0541 10/28/17 0527  NA 136 136 133* 131*  K 3.4* 3.4* 3.5 3.3*  CL 94* 92* 91* 85*  CO2 29 30 29 31   GLUCOSE 91 96 133* 138*  BUN 16 15 16  28*  CREATININE 0.84 0.87 0.84 0.99  CALCIUM 9.4 9.9 9.8 9.4  MG  --   --  1.7 1.9   GFR: Estimated Creatinine Clearance: 66.7 mL/min (by C-G formula based on SCr of 0.99 mg/dL). Liver Function Tests: Recent Labs  Lab 10/25/17 1253  AST 30  ALT 18  ALKPHOS 88  BILITOT 1.2  PROT 7.3  ALBUMIN 3.8   No results for input(s): LIPASE, AMYLASE in the last 168 hours. No results for input(s): AMMONIA in the last 168 hours. Coagulation Profile: No results for input(s): INR, PROTIME in the last 168 hours. Cardiac Enzymes: No results for input(s): CKTOTAL, CKMB, CKMBINDEX, TROPONINI in the last 168 hours. BNP (last 3 results) Recent Labs    04/07/17 1632  PROBNP 230.0*   HbA1C: No results for input(s): HGBA1C in the last 72 hours. CBG: Recent Labs  Lab 10/25/17 1148  GLUCAP 89   Lipid Profile: No results for input(s): CHOL, HDL, LDLCALC, TRIG, CHOLHDL, LDLDIRECT in the last 72 hours. Thyroid Function Tests: Recent Labs    10/26/17 1823  TSH 1.999   Anemia Panel: No results for input(s): VITAMINB12, FOLATE, FERRITIN, TIBC, IRON, RETICCTPCT in the last 72 hours. Sepsis Labs: Recent Labs  Lab 10/26/17 1817  LATICACIDVEN 1.35    Recent Results (from the past 240 hour(s))  Urine culture     Status: None   Collection Time: 10/25/17 12:53 PM  Result Value Ref Range Status   Specimen Description   Final    URINE, CLEAN CATCH Performed at [facility] [address], [place], [place] [address]    Special Requests   Final    Normal Performed  at [facility], [place] [address], [place], [place] [address]    Culture   Final    NO GROWTH Performed at [facility], [place] [address], [place],  [address]    Report Status 10/26/2017 FINAL  Final         Radiology Studies: Ct Head Wo Contrast  Result Date: 10/26/2017 CLINICAL DATA:  Leg weakness for 4 days. EXAM: CT HEAD WITHOUT CONTRAST TECHNIQUE: Contiguous axial images were obtained from the base of the skull through the vertex without intravenous contrast. COMPARISON:  11/11/2013 FINDINGS: Brain: Chronic moderate small vessel ischemic disease of periventricular white matter. Age appropriate involutional changes of the brain. No hydrocephalus. No effacement of the basal cisterns. Cerebellum  and brainstem are stable and intact. No intra-axial mass nor extra-axial fluid collections. No large vascular territory infarct. Chronic small lacunar infarct involving the anterior limb of the left internal capsule as before. Vascular: No hyperdense vessel or unexpected calcification. Skull: Negative for fracture or focal lesion. Sinuses/Orbits: Status post bilateral maxillary antrectomies. Minimal ethmoid and sphenoid sinus mucosal thickening. Intact orbits and globes. Other: None IMPRESSION: 1. Chronic microvascular ischemic disease and old left lacunar infarct of the anterior limb of the internal capsule. No acute intracranial abnormality. 2. Status post bilateral maxillary antrectomy. Electronically Signed   By: Ashley Royalty [name]   On: 10/26/2017 19:53   Mr Lumbar Spine Wo Contrast  Result Date: 10/26/2017 CLINICAL DATA:  Extreme low back pain. EXAM: MRI LUMBAR SPINE WITHOUT CONTRAST TECHNIQUE: Multiplanar, multisequence MR imaging of the lumbar spine was performed. No intravenous contrast was administered. COMPARISON:  MRI lumbar spine 08/05/2010. FINDINGS: There is extreme motion degradation. Study is marginally diagnostic. Segmentation: Standard. Alignment: Exaggerated  lumbar lordosis. 2 mm anterolisthesis L5-S1. 2 mm compensatory retrolisthesis L3-4 L2-3 and trace L1-2. Vertebrae: No worrisome osseous lesion. Endplate reactive changes related to disc space narrowing. Conus medullaris and cauda equina: Conus extends to the L1. level. Conus and cauda equina appear normal. Paraspinal and other soft tissues: Renal cystic disease, poorly visualized. Disc levels: L1-L2: Disc space narrowing. Central protrusion. Facet arthropathy. No definite impingement. L2-L3: 2 mm retrolisthesis. Central protrusion. Posterior element hypertrophy. Moderate stenosis. BILATERAL L2 and L3 nerve root impingement likely. L3-L4: Disc space narrowing. Central protrusion. Posterior element hypertrophy. Moderate to severe stenosis. BILATERAL L3 and L4 nerve root impingement. L4-L5: Disc space narrowing. Central protrusion. Posterior element hypertrophy. Severe stenosis. BILATERAL L4 and L5 nerve root impingement. L5-S1: 2 mm anterolisthesis. Annular bulge. Posterior element hypertrophy. No definite impingement. IMPRESSION: Marginally diagnostic study demonstrating moderate to severe stenosis throughout the L2-3 through L4-5 disc spaces related to subluxations, disc space narrowing, disc protrusions, and posterior element hypertrophy. Symptomatic neural impingement could occur at any or all of those levels. See discussion above. Suspected progression since previous MR from 2012. Electronically Signed   By: Staci Righter [name]   On: 10/26/2017 20:45   Dg Chest Port 1 View  Result Date: 10/28/2017 CLINICAL DATA:  Monoclonal gammopathy of unknown significance. History of CHF, COPD, atrial fibrillation, on home oxygen. EXAM: PORTABLE CHEST 1 VIEW COMPARISON:  Chest x-ray of Oct 25, 2017 FINDINGS: The lungs are well-expanded. There is stable soft tissue fullness in the right paratracheal region. The cardiac silhouette remains enlarged. The pulmonary vascularity is not engorged. There is calcification in the wall of  the aortic arch. There degenerative changes of both shoulders. IMPRESSION: Resolution of pulmonary interstitial edema. Stable cardiac enlargement. No pulmonary vascular congestion. COPD. No acute pneumonia. Stable soft tissue fullness in the right paratracheal region which reflects prominent vascular structures on a CT scan in May of 2015. Thoracic aortic atherosclerosis. Electronically Signed   By: David  Martinique [name]   On: 10/28/2017 11:05   Dg Swallowing Func-speech Pathology  Result Date: 10/27/2017 Objective Swallowing Evaluation: Type of Study: MBS-Modified Barium Swallow Study  Patient Details Name: JASMOND RIVER MRN: [id number] Date of Birth: Sep 26, 1930 Today's Date: 10/27/2017 Time: SLP Start Time (ACUTE ONLY): 1425 -SLP Stop Time (ACUTE ONLY): 1500 SLP Time Calculation (min) (ACUTE ONLY): 35 min Past Medical History: Past Medical History: Diagnosis Date . Acute bronchitis  . Adenomatous colon polyp  . Allergic rhinitis  . Anemia   iron deficient . Atrial fibrillation ([place])  .  Atrial flutter ([place])  . Chronic gastritis  . COPD (chronic obstructive pulmonary disease) ([place])  . Diastolic dysfunction  . Diverticulosis of colon   recurrent GI bleeding . DJD (degenerative joint disease)  . GERD (gastroesophageal reflux disease)  . GI bleed  . HTN (hypertension)  . Hyperplastic colon polyp 2003 . Hypothyroidism  . Moderate aortic insufficiency  . OSA (obstructive sleep apnea)   compliants w/ CPAP . Positive PPD   remote . Shortness of breath  . Venous insufficiency  Past Surgical History: Past Surgical History: Procedure Laterality Date . CARDIAC CATHETERIZATION  7.27.05  revealed a preserved EF with no CAD . COLONOSCOPY   . HERNIA REPAIR Bilateral  . JOINT REPLACEMENT  2001  lt total knee-rt one done 00 . NASAL SINUS SURGERY   . POLYPECTOMY    adenomatous polyps 2010 . SHOULDER ARTHROSCOPY   . TOE SURGERY Right  . TONSILLECTOMY   . TOTAL KNEE ARTHROPLASTY    bilateral HPI: NORBERTO WISHON is a 84 y.o. male with  medical history significant of HTN, A. fib, COPD on 2 L of nasal cannula oxygen, CHF, hypothyroidism, MGUS, OSA on CPAP, and venous stasis; who presents with complaints of inability to ambulate.  At baseline patient uses a walker to ambulate.  2 days prior to admit pt have problems standing and fell back into a chair.   Pt unable to care for himself at home and his primary care provider advised him to come back into the hospital for further treatment.  He he has these chronic venous stasis ulcerations.  Patient also report to md having a 60 pound weight loss after changing his diet in the last 18 months, but for the last 6 months he reports that his weight is stable.  Other associated symptoms include increased shortness of breath and worsening tremor.  Patient denies any history of Parkinson's disease.  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  Family reports to RN pt coughs with intake - RN informed MD and swallow eval ordered.  Imaging study chest showed venous pulmonary congestion and possible pericardial effusion.  Pt found to have degenerative changes  C4-C5, C5-C6, C6-C7 on prior imaging study 2005.  SLP recommended to defer clinical eval and proceed with MBS.  Subjective: pt awake in chair Assessment / Plan / Recommendation CHL IP CLINICAL IMPRESSIONS 10/27/2017 Clinical Impression Patient presents with mild pharyngoesophageal phase dysphagia without aspiration and only trace penetration of sequential swallows of thin.  Decreased timing and adequacy of laryngeal closure resulted in mild penetration with thin.  Pt observed to have retained secretions in pharynx that mixed with barium.  He was sensate to mild vallecular residuals and requested liquids to clear.   Decreased UES opening noted with resultant residuals  - ? prominent CP and mild backflow of liquids to distal pharynx without sensation..   Fortunately this was minimal and pt's lateral channels  provided protection.    Suspect this backflow and pt's kyphosis (known h/o GERD) may contribute to pt's coughing over intake.  Of note, pt did NOT cough througout entire MBS - however after MBS, he did cough and expectorate viscous clear secretions.  Advised him to keep his cough/hock strong for airway protection.  Using live video, educated pt to findings/recommendations.  Will follow for dysphagia management.  SLP Visit Diagnosis Dysphagia, pharyngeal phase (R13.13);Dysphagia, pharyngoesophageal phase (R13.14) Attention and concentration deficit following -- Frontal lobe and executive function deficit following -- Impact  on safety and function Moderate aspiration risk;Risk for inadequate nutrition/hydration   CHL IP TREATMENT RECOMMENDATION 10/27/2017 Treatment Recommendations Therapy as outlined in treatment plan below   Prognosis 10/27/2017 Prognosis for Safe Diet Advancement Good Barriers to Reach Goals -- Barriers/Prognosis Comment -- CHL IP DIET RECOMMENDATION 10/27/2017 SLP Diet Recommendations Regular solids;Thin liquid Liquid Administration via Straw Medication Administration Whole meds with liquid Compensations Minimize environmental distractions;Slow rate;Small sips/bites;Other (Comment) Postural Changes Remain semi-upright after after feeds/meals (Comment);Seated upright at 90 degrees   CHL IP OTHER RECOMMENDATIONS 10/27/2017 Recommended Consults -- Oral Care Recommendations Oral care BID Other Recommendations --   No flowsheet data found.  CHL IP FREQUENCY AND DURATION 10/27/2017 Speech Therapy Frequency (ACUTE ONLY) min 2x/week Treatment Duration 1 week      CHL IP ORAL PHASE 10/27/2017 Oral Phase Impaired Oral - Pudding Teaspoon -- Oral - Pudding Cup -- Oral - Honey Teaspoon -- Oral - Honey Cup -- Oral - Nectar Teaspoon -- Oral - Nectar Cup WFL Oral - Nectar Straw -- Oral - Thin Teaspoon -- Oral - Thin Cup WFL Oral - Thin Straw WFL Oral - Puree WFL Oral - Mech Soft WFL Oral - Regular -- Oral - Multi-Consistency  -- Oral - Pill WFL Oral Phase - Comment --  CHL IP PHARYNGEAL PHASE 10/27/2017 Pharyngeal Phase Impaired Pharyngeal- Pudding Teaspoon -- Pharyngeal -- Pharyngeal- Pudding Cup -- Pharyngeal -- Pharyngeal- Honey Teaspoon -- Pharyngeal -- Pharyngeal- Honey Cup -- Pharyngeal -- Pharyngeal- Nectar Teaspoon -- Pharyngeal -- Pharyngeal- Nectar Cup Reduced tongue base retraction;Pharyngeal residue - valleculae Pharyngeal -- Pharyngeal- Nectar Straw -- Pharyngeal -- Pharyngeal- Thin Teaspoon -- Pharyngeal -- Pharyngeal- Thin Cup Pharyngeal residue - valleculae;Reduced tongue base retraction;Penetration/Aspiration during swallow;Reduced laryngeal elevation Pharyngeal Material enters airway, remains ABOVE vocal cords and not ejected out Pharyngeal- Thin Straw Reduced tongue base retraction;Pharyngeal residue - valleculae;Reduced laryngeal elevation;Reduced airway/laryngeal closure Pharyngeal -- Pharyngeal- Puree WFL Pharyngeal -- Pharyngeal- Mechanical Soft -- Pharyngeal -- Pharyngeal- Regular WFL Pharyngeal -- Pharyngeal- Multi-consistency -- Pharyngeal -- Pharyngeal- Pill WFL Pharyngeal -- Pharyngeal Comment --  CHL IP CERVICAL ESOPHAGEAL PHASE 10/27/2017 Cervical Esophageal Phase Impaired Pudding Teaspoon -- Pudding Cup -- Honey Teaspoon -- Honey Cup -- Nectar Teaspoon -- Nectar Cup -- Nectar Straw -- Thin Teaspoon -- Thin Cup Reduced cricopharyngeal relaxation;Prominent cricopharyngeal segment Thin Straw Reduced cricopharyngeal relaxation;Prominent cricopharyngeal segment Puree Reduced cricopharyngeal relaxation;Prominent cricopharyngeal segment Mechanical Soft -- Regular Reduced cricopharyngeal relaxation;Prominent cricopharyngeal segment Multi-consistency -- Pill Reduced cricopharyngeal relaxation;Prominent cricopharyngeal segment Cervical Esophageal Comment barium tablet taken with thin easily transited though pharynx and esophagus, pt did appear to have minimal stasis at mid-esophagus without sensation, this eventually  cleared independently No flowsheet data found. Janett Labella Starbuck, [facility] SLP [phone]                   Scheduled Meds: . ammonium lactate   Topical q morning - 10a  . aspirin EC  81 mg Oral Q M,W,F  . azelastine  1 spray Each Nare BID  . carvedilol  6.25 mg Oral BID WC  . cephALEXin  500 mg Oral TID  . dexamethasone  10 mg Intravenous Q6H  . docusate sodium  100 mg Oral BID  . enoxaparin (LOVENOX) injection  40 mg Subcutaneous Q24H  . furosemide  60 mg Intravenous BID  . guaiFENesin  400 mg Oral BID  . levothyroxine  100 mcg Oral QAC breakfast  . losartan  100 mg Oral Daily  . mometasone-formoterol  2 puff Inhalation BID  .  pantoprazole  40 mg Oral BID  . potassium chloride  40 mEq Oral BID  . tiotropium  1 capsule Inhalation Daily   Continuous Infusions:   LOS: 2 days    Time spent: 35 minutes.     Hosie Poisson, MD [facility] Pager [phone]  If 7PM-7AM, please contact night-coverage [email or web address] Password [facility] 10/28/2017, 2:38 PM

## 2017-10-29 LAB — BASIC METABOLIC PANEL
ANION GAP: 15 (ref 5–15)
BUN: 46 mg/dL — ABNORMAL HIGH (ref 6–20)
CALCIUM: 9.3 mg/dL (ref 8.9–10.3)
CO2: 33 mmol/L — ABNORMAL HIGH (ref 22–32)
Chloride: 85 mmol/L — ABNORMAL LOW (ref 101–111)
Creatinine, Ser: 1.02 mg/dL (ref 0.61–1.24)
GLUCOSE: 110 mg/dL — AB (ref 65–99)
POTASSIUM: 3.1 mmol/L — AB (ref 3.5–5.1)
SODIUM: 133 mmol/L — AB (ref 135–145)

## 2017-10-29 MED ORDER — HYDRALAZINE HCL 20 MG/ML IJ SOLN: 5.0000 mg | Freq: Four times a day (QID) | INTRAMUSCULAR | Status: DC | PRN

## 2017-10-29 NOTE — Progress Notes (Signed)
PROGRESS NOTE    Richard Rosales  NKN:[id number] DOB: 07-31-1930 DOA: 10/26/2017 PCP: Unk Pinto, MD    Brief Narrative: Richard Rosales is a 84 year old male with medical history significant for CHF, hypertension, A. fib, COPD oxygen dependent, MGUS, OSA on CPAP and venous stasis presented to the emergency department complaining of inability to walk.  Patient at baseline uses a walker to ambulate but approximately 2 days prior to admission he was unable to stand up.  Also was noted to have increased in lower extremity edema after missing some Lasix doses.  Upon ED evaluation patient was noted to have normal vital signs and normal saturation on home oxygen.  His BMP was mildly elevated at 413, chest x-ray show enlarged cardiac silhouette with possible pericardial effusion.  CT of the brain with no acute abnormality and MRI of the lumbar spine shows moderate to severe stenosis of L2-L5 with possible symptomatic neural impingement.  Neurosurgery was consulted and recommended IV steroids every 6 hours and monitor for 48 to 72 hours.  Patient is admitted with working diagnosis of CHF exacerbation and spinal stenosis with neurological symptoms.    Assessment & Plan:   Principal Problem:   Spinal stenosis of lumbar region at multiple levels Active Problems:   OSA and COPD overlap syndrome (HCC)   Atrial fibrillation (HCC)   Chronic diastolic heart failure (HCC)   COPD (chronic obstructive pulmonary disease) (HCC)   GERD (gastroesophageal reflux disease)   Hypothyroidism   Hypokalemia   Spinal stenosis with nerve impingement at L2 to L5. Neurosurgery consulted, recommended atleast 48 hours of IV decadron to check for improvement in the pain. If no improvement they will look in to the surgical options.  PT evaluation.  No new neurological deficits.  Pt is able to tolerate PT and his back pain is better but not completely resolved.    Acute onchronic systolic heart failure.  Admitted to  telemetry.  Echo reviewed.  Resume IV lasix.  Diuresed about 7.5 liters of fluid since admission. .  Strict intake and output.  Resume losartan.  Resume coreg.  Watch creatinine and potassium while on lasix.  Check potassium and creatinine today.  Plan to wean him off the oxygen today.     Hypokalemia replaced.   Venous statis ulceration:  Keflex to complete the course.  Appreciate wound care consult .    Chronic atrial fib: Rate controlled.  Pt declined anti coagulation.    COPD: No wheezing. Resume dulera and spiriva.    Dysphagia: slp evaluation. slp recommending dysphagia 3 diet.    Hypothyroidism: Resume synthroid.     DVT prophylaxis: lovenox.  Code Status: full code.  Family Communication: none at bedside.  Disposition Plan: pending resolution of the respiratory failure and resolution of the back pain.   Consultants:   Neuro surgery.    Procedures: MRI LS spine.    Antimicrobials: keflex to complete the course.    Subjective: Back pain is persistent but better than yesterday.  Plan to work with PT today.   Objective: Vitals:   10/29/17 0521 10/29/17 0819 10/29/17 0822 10/29/17 1350  BP: 138/78   (!) 133/104  Pulse:  86  72  Resp:    18  Temp:    (!) 97.4 F (36.3 C)  TempSrc:    Oral  SpO2:   93% 96%  Weight:      Height:        Intake/Output Summary (Last 24 hours) at 10/29/2017 1602 Last  data filed at 10/29/2017 1347 Gross per 24 hour  Intake 1000 ml  Output 2800 ml  Net -1800 ml   Filed Weights   10/27/17 0240 10/28/17 0441 10/29/17 0510  Weight: 108.6 kg (239 lb 6.7 oz) 106.1 kg (233 lb 14.5 oz) 105.7 kg (233 lb 1.6 oz)    Examination:  General exam: Appears calm and comfortable not in distress.  Respiratory system: Clear to auscultation. Respiratory effort normal.no wheezing or rhonchi.  Cardiovascular system: S1 & S2 heard, RRR. No JVD, murmurs, . Gastrointestinal system: Abdomen is soft nontender non distended bowel  sounds heard.  Central nervous system: Alert and oriented. No focal neurological deficits. Extremities: Symmetric 5 x 5 power. Skin: No rashes, lesions or ulcers Psychiatry:Mood & affect appropriate.     Data Reviewed: I have personally reviewed following labs and imaging studies  CBC: Recent Labs  Lab 10/25/17 1253 10/27/17 0541  WBC 8.3 5.7  NEUTROABS 5.8  --   HGB 13.7 14.7  HCT 40.9 43.5  MCV 92.5 92.0  PLT 163 387   Basic Metabolic Panel: Recent Labs  Lab 10/25/17 1253 10/26/17 1821 10/27/17 0541 10/28/17 0527  NA 136 136 133* 131*  K 3.4* 3.4* 3.5 3.3*  CL 94* 92* 91* 85*  CO2 29 30 29 31   GLUCOSE 91 96 133* 138*  BUN 16 15 16  28*  CREATININE 0.84 0.87 0.84 0.99  CALCIUM 9.4 9.9 9.8 9.4  MG  --   --  1.7 1.9   GFR: Estimated Creatinine Clearance: 66.5 mL/min (by C-G formula based on SCr of 0.99 mg/dL). Liver Function Tests: Recent Labs  Lab 10/25/17 1253  AST 30  ALT 18  ALKPHOS 88  BILITOT 1.2  PROT 7.3  ALBUMIN 3.8   No results for input(s): LIPASE, AMYLASE in the last 168 hours. No results for input(s): AMMONIA in the last 168 hours. Coagulation Profile: No results for input(s): INR, PROTIME in the last 168 hours. Cardiac Enzymes: No results for input(s): CKTOTAL, CKMB, CKMBINDEX, TROPONINI in the last 168 hours. BNP (last 3 results) Recent Labs    04/07/17 1632  PROBNP 230.0*   HbA1C: No results for input(s): HGBA1C in the last 72 hours. CBG: Recent Labs  Lab 10/25/17 1148  GLUCAP 89   Lipid Profile: No results for input(s): CHOL, HDL, LDLCALC, TRIG, CHOLHDL, LDLDIRECT in the last 72 hours. Thyroid Function Tests: Recent Labs    10/26/17 1823  TSH 1.999   Anemia Panel: No results for input(s): VITAMINB12, FOLATE, FERRITIN, TIBC, IRON, RETICCTPCT in the last 72 hours. Sepsis Labs: Recent Labs  Lab 10/26/17 1817  LATICACIDVEN 1.35    Recent Results (from the past 240 hour(s))  Urine culture     Status: None    Collection Time: 10/25/17 12:53 PM  Result Value Ref Range Status   Specimen Description   Final    URINE, CLEAN CATCH Performed at [facility] [address] [place], [place] [address]    Special Requests   Final    Normal Performed at [facility], [place] [address], [place], [place] [address]    Culture   Final    NO GROWTH Performed at [facility], [place] [address] [place], [place] [address]    Report Status 10/26/2017 FINAL  Final         Radiology Studies: Dg Chest Port 1 View  Result Date: 10/28/2017 CLINICAL DATA:  Monoclonal gammopathy of unknown significance. History of CHF, COPD, atrial  fibrillation, on home oxygen. EXAM: PORTABLE CHEST 1 VIEW COMPARISON:  Chest x-ray of Oct 25, 2017 FINDINGS: The lungs are well-expanded. There is stable soft tissue fullness in the right paratracheal region. The cardiac silhouette remains enlarged. The pulmonary vascularity is not engorged. There is calcification in the wall of the aortic arch. There degenerative changes of both shoulders. IMPRESSION: Resolution of pulmonary interstitial edema. Stable cardiac enlargement. No pulmonary vascular congestion. COPD. No acute pneumonia. Stable soft tissue fullness in the right paratracheal region which reflects prominent vascular structures on a CT scan in May of 2015. Thoracic aortic atherosclerosis. Electronically Signed   By: David  Martinique [name]   On: 10/28/2017 11:05        Scheduled Meds: . ammonium lactate   Topical q morning - 10a  . aspirin EC  81 mg Oral Q M,W,F  . azelastine  1 spray Each Nare BID  . carvedilol  6.25 mg Oral BID WC  . cephALEXin  500 mg Oral TID  . dexamethasone  10 mg Intravenous Q6H  . docusate sodium  100 mg Oral BID  . enoxaparin (LOVENOX) injection  40 mg Subcutaneous Q24H  . furosemide  60 mg Intravenous BID  . guaiFENesin  400 mg Oral BID  . levothyroxine  100 mcg Oral QAC breakfast  . losartan  100 mg Oral  Daily  . mometasone-formoterol  2 puff Inhalation BID  . pantoprazole  40 mg Oral BID  . tiotropium  1 capsule Inhalation Daily   Continuous Infusions:   LOS: 3 days    Time spent: 35 minutes.     Hosie Poisson, MD [facility] Pager [phone]  If 7PM-7AM, please contact night-coverage [email or web address] Password [email or web address] 10/29/2017, 4:02 PM

## 2017-10-29 NOTE — Progress Notes (Signed)
Received report from Sara RN. I agree with previous assessment. Will continue to monitor closely.  

## 2017-10-29 NOTE — Clinical Social Work Placement (Signed)
   CLINICAL SOCIAL WORK PLACEMENT  NOTE  Date:  10/29/2017  Patient Details  Name: Richard Rosales MRN: [id number] Date of Birth: May 17, 1931  Clinical Social Work is seeking post-discharge placement for this patient at the [place] level of care (*CSW will initial, date and re-position this form in  chart as items are completed):  Yes   Patient/family provided with [facility] Work Department's list of facilities offering this level of care within the geographic area requested by the patient (or if unable, by the patient's family).  Yes   Patient/family informed of their freedom to choose among providers that offer the needed level of care, that participate in Medicare, Medicaid or managed care program needed by the patient, have an available bed and are willing to accept the patient.  Yes   Patient/family informed of [facility]'s ownership interest in [facility] and [facility], as well as of the fact that they are under no obligation to receive care at these facilities.  PASRR submitted to EDS on       PASRR number received on 10/28/17     Existing PASRR number confirmed on       FL2 transmitted to all facilities in geographic area requested by pt/family on 10/28/17     FL2 transmitted to all facilities within larger geographic area on       Patient informed that his/her managed care company has contracts with or will negotiate with certain facilities, including the following:        Yes   Patient/family informed of bed offers received.  Patient chooses bed at [facility], [place]     Physician recommends and patient chooses bed at      Patient to be transferred to   on  .  Patient to be transferred to facility by       Patient family notified on   of transfer.  Name of family member notified:        PHYSICIAN       Additional Comment:    _______________________________________________ Burnis Medin, LCSW 10/29/2017,  3:35 PM

## 2017-10-29 NOTE — Progress Notes (Signed)
Pt. assissted with placement of CPAP mask, humidifier refilled with S/W, Oxygen remains inline at 3 lpm, tolerating well, aware to notify if needed.

## 2017-10-29 NOTE — Progress Notes (Signed)
Physical Therapy Treatment Patient Details Name: Richard Rosales MRN: [id number] DOB: 1930/07/20 Today's Date: 10/29/2017    History of Present Illness 84 yo male admitted with spinal stenosis with possible neuronal impingement, CHF. hx of falls, A fib, COPD, CHF, OSA, DJD, balance deficits    PT Comments    Progressing with mobility. Pt continues to require +2 assist for all mobility tasks. Improved standing tolerance on today. Minimal c/o pain during session. He was able to take a few ambulatory steps with a RW. Remained on [place] O2.Continue to recommend SNF.     Follow Up Recommendations  SNF     Equipment Recommendations  None recommended by PT    Recommendations for Other Services       Precautions / Restrictions Precautions Precautions: Fall Precaution Comments: O2 dep Restrictions Weight Bearing Restrictions: No    Mobility  Bed Mobility Overal bed mobility: Needs Assistance Bed Mobility: Supine to Sit     Supine to sit: Max assist;+2 for physical assistance;+2 for safety/equipment;HOB elevated     General bed mobility comments: Assist for LEs and trunk. Increased time. Utilized bedpad for scooting, positioning  Transfers Overall transfer level: Needs assistance Equipment used: Rolling walker (2 wheeled) Transfers: Sit to/from Stand Sit to Stand: From elevated surface;Mod assist;+2 physical assistance;+2 safety/equipment         General transfer comment: Assist to rise, stabilize, control descent. VCs safety, technique, hand placement. Stand pivot, bed to bsc, with RW.  Ambulation/Gait Ambulation/Gait assistance: Min assist;+2 physical assistance;+2 safety/equipment Ambulation Distance (Feet): 5 Feet Assistive device: Rolling walker (2 wheeled) Gait Pattern/deviations: Step-to pattern     General Gait Details: Assist to stabilize pt and maneuver with walker. Remained on [place] O2. VCs safety, distance from RW. Followed with recliner for safety.   Stairs              Wheelchair Mobility    Modified Rankin (Stroke Patients Only)       Balance Overall balance assessment: Needs assistance         Standing balance support: Bilateral upper extremity supported Standing balance-Leahy Scale: Poor                              Cognition Arousal/Alertness: Awake/alert Behavior During Therapy: WFL for tasks assessed/performed Overall Cognitive Status: Within Functional Limits for tasks assessed                                        Exercises      General Comments        Pertinent Vitals/Pain Pain Assessment: Faces Faces Pain Scale: Hurts little more Pain Location: back Pain Descriptors / Indicators: Aching;Sore;Discomfort Pain Intervention(s): Limited activity within patient's tolerance;Monitored during session;Repositioned    Home Living                      Prior Function            PT Goals (current goals can now be found in the care plan section) Progress towards PT goals: Progressing toward goals    Frequency    Min 3X/week      PT Plan Current plan remains appropriate    Co-evaluation              AM-PAC PT "6 Clicks" Daily Activity  Outcome Measure  Difficulty turning  over in bed (including adjusting bedclothes, sheets and blankets)?: Unable Difficulty moving from lying on back to sitting on the side of the bed? : Unable Difficulty sitting down on and standing up from a chair with arms (e.g., wheelchair, bedside commode, etc,.)?: Unable Help needed moving to and from a bed to chair (including a wheelchair)?: A Lot Help needed walking in hospital room?: A Lot Help needed climbing 3-5 steps with a railing? : Total 6 Click Score: 8    End of Session Equipment Utilized During Treatment: Gait belt;Oxygen Activity Tolerance: Patient tolerated treatment well Patient left: in chair;with call bell/phone within reach;with family/visitor present   PT Visit  Diagnosis: Muscle weakness (generalized) (M62.81);Difficulty in walking, not elsewhere classified (R26.2);Unsteadiness on feet (R26.81);Other abnormalities of gait and mobility (R26.89)     Time: 0037-0488 PT Time Calculation (min) (ACUTE ONLY): 15 min  Charges:  $Therapeutic Activity: 8-22 mins                    G Codes:          Weston Anna, MPT Pager: [phone]

## 2017-10-29 NOTE — Progress Notes (Signed)
  Speech Language Pathology Treatment: Dysphagia  Patient Details Name: Richard Rosales MRN: [id number] DOB: Aug 31, 1930 Today's Date: 10/29/2017 Time: 0263-7858 SLP Time Calculation (min) (ACUTE ONLY): 40 min  Assessment / Plan / Recommendation Clinical Impression  Pt and daughter in law Richard Rosales educated to findings of MBS using video loops and written documentation.  Pt admits to having difficulty "choking" on grits today - and he states he will stop eating them.  Pt reported sensing them lodging in his throat and coughing up viscous secretions.  Per family and pt, admit to long term h/o coughing with intake. Reviewed mitigation strategies using teach back to reasoning.   Heimlich maneuver, importance of cough and "hock" strength and pH neutral state of water reviewed.  Pt admits to xerostomia also and was provided with compensations.    Please note, despite best effort -including instructing for sequential swallows, pt did NOT cough during MBS making cough with meals difficult to differentiate source.    Recommend pt and family monitor pt's tolerance - if problems progress= recommend consider seeing GI or ENT as suspect prominent Cricopharyngeus/Decr UES opening as primary source of coughing/possible aspiration.  All agree to plan and no further SLP indicated as all education completed.     HPI HPI: Richard Rosales is a 84 y.o. male with medical history significant of HTN, A. fib, COPD on 2 L of nasal cannula oxygen, CHF, hypothyroidism, MGUS, OSA on CPAP, and venous stasis; who presents with complaints of inability to ambulate.  At baseline patient uses a walker to ambulate.  2 days prior to admit pt have problems standing and fell back into a chair.   Pt unable to care for himself at home and his primary care provider advised him to come back into the hospital for further treatment.  He he has these chronic venous stasis ulcerations.  Patient also report to md having a 60 pound weight loss after  changing his diet in the last 18 months, but for the last 6 months he reports that his weight is stable.  Other associated symptoms include increased shortness of breath and worsening tremor.  Patient denies any history of Parkinson's disease.  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  Family reports to RN pt coughs with intake - RN informed MD and swallow eval ordered.  Imaging study chest showed venous pulmonary congestion and possible pericardial effusion.  Pt found to have degenerative changes  C4-C5, C5-C6, C6-C7 on prior imaging study 2005.  SLP recommended to defer clinical eval and proceed with MBS.       SLP Plan  All goals met       Recommendations  Diet recommendations: Dysphagia 3 (mechanical soft);Thin liquid Liquids provided via: Cup;Straw(avoid straw if problematic) Medication Administration: (as tolerated) Compensations: Minimize environmental distractions;Slow rate;Small sips/bites;Other (Comment)(intermittent dry swallow, start meals with liquids, drink liquids t/o meals, small frequent meals) Postural Changes and/or Swallow Maneuvers: Seated upright 90 degrees;Upright 30-60 min after meal                SLP Visit Diagnosis: Dysphagia, pharyngeal phase (R13.13);Dysphagia, pharyngoesophageal phase (R13.14) Plan: All goals met       GO                Richard Rosales 10/29/2017, 3:21 PM   Richard Rosales, [facility] SLP [phone]

## 2017-10-29 NOTE — Care Management Important Message (Signed)
Important Message  Patient Details  Name: Richard Rosales MRN: [id number] Date of Birth: 1930/08/07   Medicare Important Message Given:  Yes    Kerin Salen 10/29/2017, 1:12 PMImportant Message  Patient Details  Name: Richard Rosales MRN: [id number] Date of Birth: 1930/12/21   Medicare Important Message Given:  Yes    Kerin Salen 10/29/2017, 1:12 PM

## 2017-10-30 LAB — BASIC METABOLIC PANEL
Anion gap: 12 (ref 5–15)
BUN: 47 mg/dL — AB (ref 6–20)
CHLORIDE: 85 mmol/L — AB (ref 101–111)
CO2: 34 mmol/L — ABNORMAL HIGH (ref 22–32)
Calcium: 8.7 mg/dL — ABNORMAL LOW (ref 8.9–10.3)
Creatinine, Ser: 0.9 mg/dL (ref 0.61–1.24)
Glucose, Bld: 119 mg/dL — ABNORMAL HIGH (ref 65–99)
POTASSIUM: 2.8 mmol/L — AB (ref 3.5–5.1)
SODIUM: 131 mmol/L — AB (ref 135–145)

## 2017-10-30 LAB — POTASSIUM: POTASSIUM: 3.5 mmol/L (ref 3.5–5.1)

## 2017-10-30 LAB — MAGNESIUM: MAGNESIUM: 2.3 mg/dL (ref 1.7–2.4)

## 2017-10-30 MED ORDER — DEXAMETHASONE SODIUM PHOSPHATE 10 MG/ML IJ SOLN
10.0000 mg | Freq: Two times a day (BID) | INTRAMUSCULAR | Status: DC
  Administered 2017-10-30 – 2017-10-31 (×2): 10 mg via INTRAVENOUS
  Filled 2017-10-30 (×2): qty 1

## 2017-10-30 MED ORDER — POTASSIUM CHLORIDE 10 MEQ/100ML IV SOLN
10.0000 meq | INTRAVENOUS | Status: AC
  Administered 2017-10-30 (×2): 10 meq via INTRAVENOUS
  Filled 2017-10-30 (×2): qty 100

## 2017-10-30 MED ORDER — POTASSIUM CHLORIDE CRYS ER 20 MEQ PO TBCR
40.0000 meq | EXTENDED_RELEASE_TABLET | Freq: Two times a day (BID) | ORAL | Status: AC
  Administered 2017-10-30 (×2): 40 meq via ORAL
  Filled 2017-10-30 (×2): qty 2

## 2017-10-30 NOTE — Progress Notes (Signed)
PROGRESS NOTE    Richard Rosales  ION:[id number] DOB: 1930-10-11 DOA: 10/26/2017 PCP: Unk Pinto, MD    Brief Narrative: Richard Rosales is a 84 year old male with medical history significant for CHF, hypertension, A. fib, COPD oxygen dependent, MGUS, OSA on CPAP and venous stasis presented to the emergency department complaining of inability to walk.  Patient at baseline uses a walker to ambulate but approximately 2 days prior to admission he was unable to stand up.  Also was noted to have increased in lower extremity edema after missing some Lasix doses.  Upon ED evaluation patient was noted to have normal vital signs and normal saturation on home oxygen.  His BMP was mildly elevated at 413, chest x-ray show enlarged cardiac silhouette with possible pericardial effusion.  CT of the brain with no acute abnormality and MRI of the lumbar spine shows moderate to severe stenosis of L2-L5 with possible symptomatic neural impingement.  Neurosurgery was consulted and recommended IV steroids every 6 hours and monitor for 48 to 72 hours.  Patient is admitted with working diagnosis of CHF exacerbation and spinal stenosis with neurological symptoms.    Assessment & Plan:   Principal Problem:   Spinal stenosis of lumbar region at multiple levels Active Problems:   OSA and COPD overlap syndrome (HCC)   Atrial fibrillation (HCC)   Chronic diastolic heart failure (HCC)   COPD (chronic obstructive pulmonary disease) (HCC)   GERD (gastroesophageal reflux disease)   Hypothyroidism   Hypokalemia   Spinal stenosis with nerve impingement at L2 to L5. Neurosurgery consulted, recommended atleast 48 hours of IV decadron to check for improvement in the pain. If no improvement they will look in to the surgical options.  PT evaluation.  No new neurological deficits.  Pt is able to tolerate PT and his back pain is better but not completely resolved. So we will start tapering decadron and plan for discharge to  SNF in the next 24 hours.    Acute onchronic systolic heart failure.  Admitted to telemetry.  Echo reviewed.  Resume IV lasix.  Diuresed about 8.8 liters of fluid since admission. .  Strict intake and output.  Resume losartan.  Resume coreg.  Watch creatinine and potassium while on lasix.  Weaned him off oxygen. Creatinine stable.     Hypokalemia replaced. Repeat K tomorrow.   Venous statis ulceration:  Keflex to complete the course.  Appreciate wound care consult .    Chronic atrial fib: Rate controlled.  Pt declined anti coagulation.    COPD: No wheezing. Resume dulera and spiriva.    Dysphagia: slp evaluation. slp recommending dysphagia 3 diet.    Hypothyroidism: Resume synthroid.     DVT prophylaxis:scd's Code Status: full code.  Family Communication: none at bedside.  Disposition Plan: possibly back to SNF in 1 to 2 days.    Consultants:   Neuro surgery.    Procedures: MRI LS spine.    Antimicrobials: keflex to complete the course.    Subjective: Worked with PT, and improving back pain.   Objective: Vitals:   10/29/17 2027 10/30/17 0412 10/30/17 0600 10/30/17 1410  BP: 102/70 110/73  (!) 101/59  Pulse: 66 89  67  Resp: 20   20  Temp: 97.9 F (36.6 C) (!) 97.5 F (36.4 C)  98 F (36.7 C)  TempSrc: Oral Oral  Oral  SpO2: 97% 95%  96%  Weight:   103.8 kg (228 lb 13.4 oz)   Height:  Intake/Output Summary (Last 24 hours) at 10/30/2017 1712 Last data filed at 10/30/2017 1105 Gross per 24 hour  Intake 320 ml  Output 1650 ml  Net -1330 ml   Filed Weights   10/28/17 0441 10/29/17 0510 10/30/17 0600  Weight: 106.1 kg (233 lb 14.5 oz) 105.7 kg (233 lb 1.6 oz) 103.8 kg (228 lb 13.4 oz)    Examination:  General exam: Appears calm and comfortable not in distress. Off Oxygen.  Respiratory system: air entry fair.  Cardiovascular system: S1 & S2 heard, RRR. No JVD, murmurs, . Gastrointestinal system: Abdomen is soft NT nd  bs+ Central nervous system: Alert and oriented. Non focal.  Extremities: Symmetric 5 x 5 power. Skin: No rashes, lesions or ulcers Psychiatry:Mood & affect appropriate.     Data Reviewed: I have personally reviewed following labs and imaging studies  CBC: Recent Labs  Lab 10/25/17 1253 10/27/17 0541  WBC 8.3 5.7  NEUTROABS 5.8  --   HGB 13.7 14.7  HCT 40.9 43.5  MCV 92.5 92.0  PLT 163 956   Basic Metabolic Panel: Recent Labs  Lab 10/26/17 1821 10/27/17 0541 10/28/17 0527 10/29/17 1643 10/30/17 0509  NA 136 133* 131* 133* 131*  K 3.4* 3.5 3.3* 3.1* 2.8*  CL 92* 91* 85* 85* 85*  CO2 30 29 31  33* 34*  GLUCOSE 96 133* 138* 110* 119*  BUN 15 16 28* 46* 47*  CREATININE 0.87 0.84 0.99 1.02 0.90  CALCIUM 9.9 9.8 9.4 9.3 8.7*  MG  --  1.7 1.9  --  2.3   GFR: Estimated Creatinine Clearance: 72.5 mL/min (by C-G formula based on SCr of 0.9 mg/dL). Liver Function Tests: Recent Labs  Lab 10/25/17 1253  AST 30  ALT 18  ALKPHOS 88  BILITOT 1.2  PROT 7.3  ALBUMIN 3.8   No results for input(s): LIPASE, AMYLASE in the last 168 hours. No results for input(s): AMMONIA in the last 168 hours. Coagulation Profile: No results for input(s): INR, PROTIME in the last 168 hours. Cardiac Enzymes: No results for input(s): CKTOTAL, CKMB, CKMBINDEX, TROPONINI in the last 168 hours. BNP (last 3 results) Recent Labs    04/07/17 1632  PROBNP 230.0*   HbA1C: No results for input(s): HGBA1C in the last 72 hours. CBG: Recent Labs  Lab 10/25/17 1148  GLUCAP 89   Lipid Profile: No results for input(s): CHOL, HDL, LDLCALC, TRIG, CHOLHDL, LDLDIRECT in the last 72 hours. Thyroid Function Tests: No results for input(s): TSH, T4TOTAL, FREET4, T3FREE, THYROIDAB in the last 72 hours. Anemia Panel: No results for input(s): VITAMINB12, FOLATE, FERRITIN, TIBC, IRON, RETICCTPCT in the last 72 hours. Sepsis Labs: Recent Labs  Lab 10/26/17 1817  LATICACIDVEN 1.35    Recent Results  (from the past 240 hour(s))  Urine culture     Status: None   Collection Time: 10/25/17 12:53 PM  Result Value Ref Range Status   Specimen Description   Final    URINE, CLEAN CATCH Performed at [facility] [address], [place], [place] [address]    Special Requests   Final    Normal Performed at [facility], [place] [address], [place], [place] [address]    Culture   Final    NO GROWTH Performed at [facility], [place] [address], [place], [place] [address]    Report Status 10/26/2017 FINAL  Final         Radiology Studies: No results found.      Scheduled  Meds: . ammonium lactate   Topical q morning - 10a  . aspirin EC  81 mg Oral Q M,W,F  . azelastine  1 spray Each Nare BID  . carvedilol  6.25 mg Oral BID WC  . cephALEXin  500 mg Oral TID  . dexamethasone  10 mg Intravenous Q12H  . docusate sodium  100 mg Oral BID  . enoxaparin (LOVENOX) injection  40 mg Subcutaneous Q24H  . furosemide  60 mg Intravenous BID  . guaiFENesin  400 mg Oral BID  . levothyroxine  100 mcg Oral QAC breakfast  . losartan  100 mg Oral Daily  . mometasone-formoterol  2 puff Inhalation BID  . pantoprazole  40 mg Oral BID  . potassium chloride  40 mEq Oral BID  . tiotropium  1 capsule Inhalation Daily   Continuous Infusions:   LOS: 4 days    Time spent: 35 minutes.     Hosie Poisson, MD [facility] Pager [phone]  If 7PM-7AM, please contact night-coverage [email or web address] Password [email or web address] 10/30/2017, 5:12 PM

## 2017-10-31 DIAGNOSIS — R279 Unspecified lack of coordination: Secondary | ICD-10-CM | POA: Diagnosis not present

## 2017-10-31 DIAGNOSIS — R0902 Hypoxemia: Secondary | ICD-10-CM | POA: Diagnosis not present

## 2017-10-31 DIAGNOSIS — B9689 Other specified bacterial agents as the cause of diseases classified elsewhere: Secondary | ICD-10-CM | POA: Diagnosis present

## 2017-10-31 DIAGNOSIS — R2689 Other abnormalities of gait and mobility: Secondary | ICD-10-CM | POA: Diagnosis not present

## 2017-10-31 DIAGNOSIS — R55 Syncope and collapse: Secondary | ICD-10-CM | POA: Diagnosis not present

## 2017-10-31 DIAGNOSIS — R748 Abnormal levels of other serum enzymes: Secondary | ICD-10-CM | POA: Diagnosis not present

## 2017-10-31 DIAGNOSIS — I482 Chronic atrial fibrillation: Secondary | ICD-10-CM | POA: Diagnosis not present

## 2017-10-31 DIAGNOSIS — E876 Hypokalemia: Secondary | ICD-10-CM | POA: Diagnosis not present

## 2017-10-31 DIAGNOSIS — I5023 Acute on chronic systolic (congestive) heart failure: Secondary | ICD-10-CM | POA: Diagnosis not present

## 2017-10-31 DIAGNOSIS — G4733 Obstructive sleep apnea (adult) (pediatric): Secondary | ICD-10-CM | POA: Diagnosis not present

## 2017-10-31 DIAGNOSIS — R682 Dry mouth, unspecified: Secondary | ICD-10-CM | POA: Diagnosis not present

## 2017-10-31 DIAGNOSIS — R0609 Other forms of dyspnea: Secondary | ICD-10-CM | POA: Diagnosis not present

## 2017-10-31 DIAGNOSIS — R1314 Dysphagia, pharyngoesophageal phase: Secondary | ICD-10-CM | POA: Diagnosis not present

## 2017-10-31 DIAGNOSIS — N3000 Acute cystitis without hematuria: Secondary | ICD-10-CM | POA: Diagnosis not present

## 2017-10-31 DIAGNOSIS — G9341 Metabolic encephalopathy: Secondary | ICD-10-CM | POA: Diagnosis present

## 2017-10-31 DIAGNOSIS — R410 Disorientation, unspecified: Secondary | ICD-10-CM | POA: Diagnosis not present

## 2017-10-31 DIAGNOSIS — R278 Other lack of coordination: Secondary | ICD-10-CM | POA: Diagnosis not present

## 2017-10-31 DIAGNOSIS — K219 Gastro-esophageal reflux disease without esophagitis: Secondary | ICD-10-CM | POA: Diagnosis not present

## 2017-10-31 DIAGNOSIS — G250 Essential tremor: Secondary | ICD-10-CM | POA: Diagnosis not present

## 2017-10-31 DIAGNOSIS — E039 Hypothyroidism, unspecified: Secondary | ICD-10-CM | POA: Diagnosis not present

## 2017-10-31 DIAGNOSIS — E860 Dehydration: Secondary | ICD-10-CM | POA: Diagnosis present

## 2017-10-31 DIAGNOSIS — D472 Monoclonal gammopathy: Secondary | ICD-10-CM | POA: Diagnosis present

## 2017-10-31 DIAGNOSIS — L97929 Non-pressure chronic ulcer of unspecified part of left lower leg with unspecified severity: Secondary | ICD-10-CM | POA: Diagnosis not present

## 2017-10-31 DIAGNOSIS — J9811 Atelectasis: Secondary | ICD-10-CM | POA: Diagnosis not present

## 2017-10-31 DIAGNOSIS — K5900 Constipation, unspecified: Secondary | ICD-10-CM | POA: Diagnosis not present

## 2017-10-31 DIAGNOSIS — I872 Venous insufficiency (chronic) (peripheral): Secondary | ICD-10-CM | POA: Diagnosis not present

## 2017-10-31 DIAGNOSIS — M6281 Muscle weakness (generalized): Secondary | ICD-10-CM | POA: Diagnosis not present

## 2017-10-31 DIAGNOSIS — I480 Paroxysmal atrial fibrillation: Secondary | ICD-10-CM | POA: Diagnosis not present

## 2017-10-31 DIAGNOSIS — Z743 Need for continuous supervision: Secondary | ICD-10-CM | POA: Diagnosis not present

## 2017-10-31 DIAGNOSIS — I481 Persistent atrial fibrillation: Secondary | ICD-10-CM | POA: Diagnosis not present

## 2017-10-31 DIAGNOSIS — I5032 Chronic diastolic (congestive) heart failure: Secondary | ICD-10-CM | POA: Diagnosis not present

## 2017-10-31 DIAGNOSIS — I4891 Unspecified atrial fibrillation: Secondary | ICD-10-CM | POA: Diagnosis not present

## 2017-10-31 DIAGNOSIS — I110 Hypertensive heart disease with heart failure: Secondary | ICD-10-CM | POA: Diagnosis present

## 2017-10-31 DIAGNOSIS — K5730 Diverticulosis of large intestine without perforation or abscess without bleeding: Secondary | ICD-10-CM | POA: Diagnosis present

## 2017-10-31 DIAGNOSIS — I10 Essential (primary) hypertension: Secondary | ICD-10-CM | POA: Diagnosis not present

## 2017-10-31 DIAGNOSIS — R41841 Cognitive communication deficit: Secondary | ICD-10-CM | POA: Diagnosis not present

## 2017-10-31 DIAGNOSIS — R531 Weakness: Secondary | ICD-10-CM | POA: Diagnosis not present

## 2017-10-31 DIAGNOSIS — M25571 Pain in right ankle and joints of right foot: Secondary | ICD-10-CM | POA: Diagnosis not present

## 2017-10-31 DIAGNOSIS — I472 Ventricular tachycardia: Secondary | ICD-10-CM | POA: Diagnosis present

## 2017-10-31 DIAGNOSIS — M4802 Spinal stenosis, cervical region: Secondary | ICD-10-CM | POA: Diagnosis present

## 2017-10-31 DIAGNOSIS — M351 Other overlap syndromes: Secondary | ICD-10-CM | POA: Diagnosis present

## 2017-10-31 DIAGNOSIS — E871 Hypo-osmolality and hyponatremia: Secondary | ICD-10-CM | POA: Diagnosis present

## 2017-10-31 DIAGNOSIS — N390 Urinary tract infection, site not specified: Secondary | ICD-10-CM | POA: Diagnosis not present

## 2017-10-31 DIAGNOSIS — J449 Chronic obstructive pulmonary disease, unspecified: Secondary | ICD-10-CM | POA: Diagnosis not present

## 2017-10-31 DIAGNOSIS — M48061 Spinal stenosis, lumbar region without neurogenic claudication: Secondary | ICD-10-CM | POA: Diagnosis not present

## 2017-10-31 DIAGNOSIS — L97919 Non-pressure chronic ulcer of unspecified part of right lower leg with unspecified severity: Secondary | ICD-10-CM | POA: Diagnosis not present

## 2017-10-31 DIAGNOSIS — I351 Nonrheumatic aortic (valve) insufficiency: Secondary | ICD-10-CM | POA: Diagnosis present

## 2017-10-31 DIAGNOSIS — R2681 Unsteadiness on feet: Secondary | ICD-10-CM | POA: Diagnosis not present

## 2017-10-31 LAB — BASIC METABOLIC PANEL
ANION GAP: 13 (ref 5–15)
BUN: 57 mg/dL — AB (ref 6–20)
CO2: 34 mmol/L — ABNORMAL HIGH (ref 22–32)
Calcium: 8.7 mg/dL — ABNORMAL LOW (ref 8.9–10.3)
Chloride: 87 mmol/L — ABNORMAL LOW (ref 101–111)
Creatinine, Ser: 0.92 mg/dL (ref 0.61–1.24)
GFR calc Af Amer: >60 mL/min (ref >60)
GLUCOSE: 125 mg/dL — AB (ref 65–99)
Potassium: 3.8 mmol/L (ref 3.5–5.1)
SODIUM: 134 mmol/L — AB (ref 135–145)

## 2017-10-31 MED ORDER — FUROSEMIDE 20 MG PO TABS: 60.0000 mg | ORAL_TABLET | Freq: Two times a day (BID) | ORAL | 0 refills | Status: DC

## 2017-10-31 MED ORDER — ALBUTEROL SULFATE (2.5 MG/3ML) 0.083% IN NEBU: 2.5000 mg | INHALATION_SOLUTION | RESPIRATORY_TRACT | 11 refills | Status: AC | PRN

## 2017-10-31 MED ORDER — AMMONIUM LACTATE 12 % EX LOTN: TOPICAL_LOTION | Freq: Every morning | CUTANEOUS | 0 refills | Status: AC

## 2017-10-31 MED ORDER — DEXAMETHASONE 6 MG PO TABS: 6.0000 mg | ORAL_TABLET | Freq: Two times a day (BID) | ORAL | 0 refills | Status: AC

## 2017-10-31 MED ORDER — CARVEDILOL 6.25 MG PO TABS: 6.2500 mg | ORAL_TABLET | Freq: Two times a day (BID) | ORAL | 0 refills | Status: DC

## 2017-10-31 MED ORDER — FUROSEMIDE 40 MG PO TABS: 40.0000 mg | ORAL_TABLET | Freq: Two times a day (BID) | ORAL | Status: DC

## 2017-10-31 MED ORDER — FUROSEMIDE 40 MG PO TABS: 60.0000 mg | ORAL_TABLET | Freq: Two times a day (BID) | ORAL | Status: DC

## 2017-10-31 NOTE — Plan of Care (Signed)
  Problem: Activity: Goal: Risk for activity intolerance will decrease Outcome: Progressing   Problem: Elimination: Goal: Will not experience complications related to bowel motility Outcome: Progressing Goal: Will not experience complications related to urinary retention Outcome: Progressing   Problem: Pain Managment: Goal: General experience of comfort will improve Outcome: Progressing   Problem: Skin Integrity: Goal: Risk for impaired skin integrity will decrease Outcome: Progressing   

## 2017-10-31 NOTE — Discharge Summary (Addendum)
Physician Discharge Summary  WON KREUZER OEV:[id number] DOB: Dec 26, 1930 DOA: 10/26/2017  PCP: Unk Pinto, MD  Admit date: 10/26/2017 Discharge date: 10/31/2017  Admitted From: Home.  Disposition: SNF.   Recommendations for Outpatient Follow-up:  1. Follow up with PCP in 1-2 weeks 2. Please obtain BMP/CBC in one week Please follow up with Dr [name] with 2 weeks.  Please follow up with cardiology in 1 to 2  Weeks.  Please follow up with wound care.   Discharge Condition:stable.  CODE STATUS: full code.  Diet recommendation: Heart Healthy   Brief/Interim Summary: Lenardo [place] Poulosis a 84 year old male with medical history significant for CHF, hypertension, A. fib, COPD oxygen dependent, MGUS, OSA on CPAP and venous stasis presented to the emergency department complaining of inability to walk. Patient at baseline uses a walker to ambulate but approximately 2 days prior to admission he was unable to stand up. Also was noted to have increased in lower extremity edema after missing some Lasix doses. Upon ED evaluation patient was noted to have normal vital signs and normal saturation on home oxygen. His BMP was mildly elevated at 413, chest x-ray show enlarged cardiac silhouette with possible pericardial effusion. CT of the brain with no acute abnormality and MRI of the lumbar spine shows moderate to severe stenosis of L2-L5 with possible symptomatic neural impingement. Neurosurgery was consulted and recommended IV steroids every 6 hours and monitor for 48 to 72 hours. Patient is admitted with working diagnosis of CHF exacerbation and spinal stenosis with neurological symptoms.     Discharge Diagnoses:  Principal Problem:   Spinal stenosis of lumbar region at multiple levels Active Problems:   OSA and COPD overlap syndrome (HCC)   Atrial fibrillation (HCC)   Chronic diastolic heart failure (HCC)   COPD (chronic obstructive pulmonary disease) (HCC)   GERD (gastroesophageal reflux  disease)   Hypothyroidism   Hypokalemia    Spinal stenosis with nerve impingement at L2 to L5. Neurosurgery consulted, recommended atleast 48 hours of IV decadron to check for improvement in the pain. If no improvement they will look in to the surgical options.  PT evaluation recommending SNF.  No new neurological deficits.  Pt is able to tolerate PT and his back pain is better but not completely resolved. So we will start tapering decadron and plan for discharge to SNF in the next 24 hours.  Recommend outpatient follow up with neuro surgery Dr [name] in 1 to 2 weeks.    Acute onchronic systolic heart failure.  Admitted to telemetry.  Echo reviewed.  Resume IV lasix.  Diuresed about 9.8  liters of fluid since admission. Marland Kitchen  Resume coreg.  Watch creatinine and potassium while on lasix.  Weaned him off oxygen. Creatinine stable.  His home medications losartan and cardizem were discontinued because of low bp .  They can be restarted at a later time as per his cardiologist. .     Hypokalemia replaced.   Venous statis ulceration:  Completed the course with keflex.  Appreciate wound care consult .    Chronic atrial fib: Rate controlled with coreg.  Pt declined anti coagulation.    COPD: No wheezing. Resume dulera and spiriva.    Dysphagia: slp evaluation. slp recommending dysphagia 3 diet.    Hypothyroidism: Resume synthroid.     Discharge Instructions  Discharge Instructions    Diet - low sodium heart healthy   Complete by:  As directed    Discharge instructions   Complete by:  As directed  Please follow up with Dr [name] in 2 weeks.  Please follow up with cardiology in 1 to 2 weeks.     Allergies as of 10/31/2017      Reactions   Ace Inhibitors Cough   Beta Adrenergic Blockers Other (See Comments)   bradycardia   Decadron [dexamethasone]    unknown   Fluticasone-salmeterol    Doesn't remember   Hytrin [terazosin]    Nasal congestion    Prednisone    Shakes / tremors   Vioxx [rofecoxib] Other (See Comments)   dyspepsia      Medication List    STOP taking these medications   acetaminophen-codeine 300-30 MG tablet Commonly known as:  TYLENOL #3   amoxicillin 500 MG capsule Commonly known as:  AMOXIL   cephALEXin 500 MG capsule Commonly known as:  KEFLEX   chlorpheniramine 4 MG tablet Commonly known as:  CHLOR-TRIMETON   DILT-XR 240 MG 24 hr capsule Generic drug:  diltiazem   guaifenesin 400 MG Tabs tablet Commonly known as:  HUMIBID E   HYDROcodone-acetaminophen 5-325 MG tablet Commonly known as:  NORCO/VICODIN   losartan 100 MG tablet Commonly known as:  COZAAR     TAKE these medications   albuterol 108 (90 Base) MCG/ACT inhaler Commonly known as:  PROVENTIL HFA;VENTOLIN HFA Inhale 2 puffs into the lungs every 6 (six) hours as needed for wheezing or shortness of breath. What changed:  Another medication with the same name was changed. Make sure you understand how and when to take each.   albuterol (2.5 MG/3ML) 0.083% nebulizer solution Commonly known as:  PROVENTIL Take 3 mLs (2.5 mg total) by nebulization every 4 (four) hours as needed for wheezing or shortness of breath. What changed:    when to take this  reasons to take this   ammonium lactate 12 % lotion Commonly known as:  LAC-HYDRIN Apply topically every morning.   aspirin 81 MG tablet Take 1 tablet 3 days per week   azelastine 0.1 % nasal spray Commonly known as:  ASTELIN USE 1 SPRAY IN EACH NOSTRIL TWICE A DAY   b complex vitamins tablet Take 1 tablet by mouth every morning.   carvedilol 6.25 MG tablet Commonly known as:  COREG Take 1 tablet (6.25 mg total) by mouth 2 (two) times daily with a meal.   dexamethasone 6 MG tablet Commonly known as:  DECADRON Take 1 tablet (6 mg total) by mouth 2 (two) times daily for 5 days.   docusate sodium 100 MG capsule Commonly known as:  COLACE Take 100 mg by mouth 2 (two) times  daily.   fish oil-omega-3 fatty acids 1000 MG capsule Take 1 g by mouth 2 (two) times daily.   furosemide 20 MG tablet Commonly known as:  LASIX Take 3 tablets (60 mg total) by mouth 2 (two) times daily. What changed:    medication strength  See the new instructions.   glucosamine-chondroitin 500-400 MG tablet Take 1 tablet by mouth 1 day or 1 dose.   levothyroxine 100 MCG tablet Commonly known as:  SYNTHROID, LEVOTHROID TAKE 1 TABLET DAILY BEFORE BREAKFAST   metolazone 5 MG tablet Commonly known as:  ZAROXOLYN TAKE 1 TABLET DAILY AS DIRECTED FOR FLUID AND SWELLING   omeprazole 40 MG capsule Commonly known as:  PRILOSEC TAKE 1 CAPSULE TWICE A DAY What changed:    how much to take  how to take this  when to take this   potassium chloride 10 MEQ tablet Commonly known as:  K-DUR Take 1 tablet (10 mEq total) by mouth daily.   SPIRIVA HANDIHALER 18 MCG inhalation capsule Generic drug:  tiotropium INHALE THE CONTENTS OF 1 CAPSULE DAILY   SYMBICORT 160-4.5 MCG/ACT inhaler Generic drug:  budesonide-formoterol USE 2 INHALATIONS TWICE A DAY   Vitamin D 2000 units Caps Take 2 capsules by mouth daily.       Contact information for follow-up providers    Thompson Grayer, MD Follow up on 12/28/2017.   Specialty:  Cardiology Why:  Please arrive 15 minutes early for your 2:15pm appointment Contact information: [place] [address] [phone]            Contact information for after-discharge care    Destination    [facility] .   Service:  Skilled Nursing Contact information: [place] [phone]                 Allergies  Allergen Reactions  . Ace Inhibitors Cough  . Beta Adrenergic Blockers Other (See Comments)    bradycardia  . Decadron [Dexamethasone]     unknown  . Fluticasone-Salmeterol     Doesn't remember  . Hytrin [Terazosin]     Nasal  congestion   . Prednisone     Shakes / tremors  . Vioxx [Rofecoxib] Other (See Comments)    dyspepsia    Consultations:  Neuro surgery over the phone.    Procedures/Studies: Dg Chest 2 View  Result Date: 10/25/2017 CLINICAL DATA:  84 year old male with history of weakness. EXAM: CHEST - 2 VIEW COMPARISON:  Chest x-ray 04/07/2017. FINDINGS: In volumes are low. No consolidative airspace disease. No pleural effusions. Cephalization of the pulmonary vasculature, without frank pulmonary edema. Enlargement of the cardiopericardial silhouette, which could reflect underlying cardiomegaly and/or the presence of a pericardial effusion. Upper mediastinal contours are within normal limits. Aortic atherosclerosis. IMPRESSION: 1. Enlarged cardiopericardial silhouette, which could reflect cardiomegaly and/or presence of pericardial effusion. If, further evaluation there is clinical concern for pericardial effusion with echocardiography is suggested. 2. Pulmonary venous congestion, without frank pulmonary edema. 3. Aortic atherosclerosis. Electronically Signed   By: Vinnie Langton [name]   On: 10/25/2017 13:17   Ct Head Wo Contrast  Result Date: 10/26/2017 CLINICAL DATA:  Leg weakness for 4 days. EXAM: CT HEAD WITHOUT CONTRAST TECHNIQUE: Contiguous axial images were obtained from the base of the skull through the vertex without intravenous contrast. COMPARISON:  11/11/2013 FINDINGS: Brain: Chronic moderate small vessel ischemic disease of periventricular white matter. Age appropriate involutional changes of the brain. No hydrocephalus. No effacement of the basal cisterns. Cerebellum and brainstem are stable and intact. No intra-axial mass nor extra-axial fluid collections. No large vascular territory infarct. Chronic small lacunar infarct involving the anterior limb of the left internal capsule as before. Vascular: No hyperdense vessel or unexpected calcification. Skull: Negative for fracture or focal lesion.  Sinuses/Orbits: Status post bilateral maxillary antrectomies. Minimal ethmoid and sphenoid sinus mucosal thickening. Intact orbits and globes. Other: None IMPRESSION: 1. Chronic microvascular ischemic disease and old left lacunar infarct of the anterior limb of the internal capsule. No acute intracranial abnormality. 2. Status post bilateral maxillary antrectomy. Electronically Signed   By: Ashley Royalty [name]   On: 10/26/2017 19:53   Mr Lumbar Spine Wo Contrast  Result Date: 10/26/2017 CLINICAL DATA:  Extreme low back pain. EXAM: MRI LUMBAR SPINE WITHOUT CONTRAST TECHNIQUE: Multiplanar, multisequence MR imaging of the lumbar spine was performed. No intravenous  contrast was administered. COMPARISON:  MRI lumbar spine 08/05/2010. FINDINGS: There is extreme motion degradation. Study is marginally diagnostic. Segmentation: Standard. Alignment: Exaggerated lumbar lordosis. 2 mm anterolisthesis L5-S1. 2 mm compensatory retrolisthesis L3-4 L2-3 and trace L1-2. Vertebrae: No worrisome osseous lesion. Endplate reactive changes related to disc space narrowing. Conus medullaris and cauda equina: Conus extends to the L1. level. Conus and cauda equina appear normal. Paraspinal and other soft tissues: Renal cystic disease, poorly visualized. Disc levels: L1-L2: Disc space narrowing. Central protrusion. Facet arthropathy. No definite impingement. L2-L3: 2 mm retrolisthesis. Central protrusion. Posterior element hypertrophy. Moderate stenosis. BILATERAL L2 and L3 nerve root impingement likely. L3-L4: Disc space narrowing. Central protrusion. Posterior element hypertrophy. Moderate to severe stenosis. BILATERAL L3 and L4 nerve root impingement. L4-L5: Disc space narrowing. Central protrusion. Posterior element hypertrophy. Severe stenosis. BILATERAL L4 and L5 nerve root impingement. L5-S1: 2 mm anterolisthesis. Annular bulge. Posterior element hypertrophy. No definite impingement. IMPRESSION: Marginally diagnostic study  demonstrating moderate to severe stenosis throughout the L2-3 through L4-5 disc spaces related to subluxations, disc space narrowing, disc protrusions, and posterior element hypertrophy. Symptomatic neural impingement could occur at any or all of those levels. See discussion above. Suspected progression since previous MR from 2012. Electronically Signed   By: Staci Righter [name]   On: 10/26/2017 20:45   Dg Chest Port 1 View  Result Date: 10/28/2017 CLINICAL DATA:  Monoclonal gammopathy of unknown significance. History of CHF, COPD, atrial fibrillation, on home oxygen. EXAM: PORTABLE CHEST 1 VIEW COMPARISON:  Chest x-ray of Oct 25, 2017 FINDINGS: The lungs are well-expanded. There is stable soft tissue fullness in the right paratracheal region. The cardiac silhouette remains enlarged. The pulmonary vascularity is not engorged. There is calcification in the wall of the aortic arch. There degenerative changes of both shoulders. IMPRESSION: Resolution of pulmonary interstitial edema. Stable cardiac enlargement. No pulmonary vascular congestion. COPD. No acute pneumonia. Stable soft tissue fullness in the right paratracheal region which reflects prominent vascular structures on a CT scan in May of 2015. Thoracic aortic atherosclerosis. Electronically Signed   By: David  Martinique [name]   On: 10/28/2017 11:05   Dg Swallowing Func-speech Pathology  Result Date: 10/27/2017 Objective Swallowing Evaluation: Type of Study: MBS-Modified Barium Swallow Study  Patient Details Name: OSAMU OLGUIN MRN: [id number] Date of Birth: 1930-11-13 Today's Date: 10/27/2017 Time: SLP Start Time (ACUTE ONLY): 1425 -SLP Stop Time (ACUTE ONLY): 1500 SLP Time Calculation (min) (ACUTE ONLY): 35 min Past Medical History: Past Medical History: Diagnosis Date . Acute bronchitis  . Adenomatous colon polyp  . Allergic rhinitis  . Anemia   iron deficient . Atrial fibrillation ([place])  . Atrial flutter ([place])  . Chronic gastritis  . COPD (chronic obstructive  pulmonary disease) ([place])  . Diastolic dysfunction  . Diverticulosis of colon   recurrent GI bleeding . DJD (degenerative joint disease)  . GERD (gastroesophageal reflux disease)  . GI bleed  . HTN (hypertension)  . Hyperplastic colon polyp 2003 . Hypothyroidism  . Moderate aortic insufficiency  . OSA (obstructive sleep apnea)   compliants w/ CPAP . Positive PPD   remote . Shortness of breath  . Venous insufficiency  Past Surgical History: Past Surgical History: Procedure Laterality Date . CARDIAC CATHETERIZATION  7.27.05  revealed a preserved EF with no CAD . COLONOSCOPY   . HERNIA REPAIR Bilateral  . JOINT REPLACEMENT  2001  lt total knee-rt one done 00 . NASAL SINUS SURGERY   . POLYPECTOMY    adenomatous polyps 2010 .  SHOULDER ARTHROSCOPY   . TOE SURGERY Right  . TONSILLECTOMY   . TOTAL KNEE ARTHROPLASTY    bilateral HPI: Richard Rosales is a 84 y.o. male with medical history significant of HTN, A. fib, COPD on 2 L of nasal cannula oxygen, CHF, hypothyroidism, MGUS, OSA on CPAP, and venous stasis; who presents with complaints of inability to ambulate.  At baseline patient uses a walker to ambulate.  2 days prior to admit pt have problems standing and fell back into a chair.   Pt unable to care for himself at home and his primary care provider advised him to come back into the hospital for further treatment.  He he has these chronic venous stasis ulcerations.  Patient also report to md having a 60 pound weight loss after changing his diet in the last 18 months, but for the last 6 months he reports that his weight is stable.  Other associated symptoms include increased shortness of breath and worsening tremor.  Patient denies any history of Parkinson's disease.  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  Family reports to RN pt coughs with intake - RN informed MD and swallow eval ordered.  Imaging study chest showed venous pulmonary congestion and possible  pericardial effusion.  Pt found to have degenerative changes  C4-C5, C5-C6, C6-C7 on prior imaging study 2005.  SLP recommended to defer clinical eval and proceed with MBS.  Subjective: pt awake in chair Assessment / Plan / Recommendation CHL IP CLINICAL IMPRESSIONS 10/27/2017 Clinical Impression Patient presents with mild pharyngoesophageal phase dysphagia without aspiration and only trace penetration of sequential swallows of thin.  Decreased timing and adequacy of laryngeal closure resulted in mild penetration with thin.  Pt observed to have retained secretions in pharynx that mixed with barium.  He was sensate to mild vallecular residuals and requested liquids to clear.   Decreased UES opening noted with resultant residuals  - ? prominent CP and mild backflow of liquids to distal pharynx without sensation..   Fortunately this was minimal and pt's lateral channels provided protection.    Suspect this backflow and pt's kyphosis (known h/o GERD) may contribute to pt's coughing over intake.  Of note, pt did NOT cough througout entire MBS - however after MBS, he did cough and expectorate viscous clear secretions.  Advised him to keep his cough/hock strong for airway protection.  Using live video, educated pt to findings/recommendations.  Will follow for dysphagia management.  SLP Visit Diagnosis Dysphagia, pharyngeal phase (R13.13);Dysphagia, pharyngoesophageal phase (R13.14) Attention and concentration deficit following -- Frontal lobe and executive function deficit following -- Impact on safety and function Moderate aspiration risk;Risk for inadequate nutrition/hydration   CHL IP TREATMENT RECOMMENDATION 10/27/2017 Treatment Recommendations Therapy as outlined in treatment plan below   Prognosis 10/27/2017 Prognosis for Safe Diet Advancement Good Barriers to Reach Goals -- Barriers/Prognosis Comment -- CHL IP DIET RECOMMENDATION 10/27/2017 SLP Diet Recommendations Regular solids;Thin liquid Liquid Administration via Straw  Medication Administration Whole meds with liquid Compensations Minimize environmental distractions;Slow rate;Small sips/bites;Other (Comment) Postural Changes Remain semi-upright after after feeds/meals (Comment);Seated upright at 90 degrees   CHL IP OTHER RECOMMENDATIONS 10/27/2017 Recommended Consults -- Oral Care Recommendations Oral care BID Other Recommendations --   No flowsheet data found.  CHL IP FREQUENCY AND DURATION 10/27/2017 Speech Therapy Frequency (ACUTE ONLY) min 2x/week Treatment Duration 1 week      CHL IP ORAL PHASE 10/27/2017 Oral Phase Impaired Oral - Pudding Teaspoon --  Oral - Pudding Cup -- Oral - Honey Teaspoon -- Oral - Honey Cup -- Oral - Nectar Teaspoon -- Oral - Nectar Cup WFL Oral - Nectar Straw -- Oral - Thin Teaspoon -- Oral - Thin Cup WFL Oral - Thin Straw WFL Oral - Puree WFL Oral - Mech Soft WFL Oral - Regular -- Oral - Multi-Consistency -- Oral - Pill WFL Oral Phase - Comment --  CHL IP PHARYNGEAL PHASE 10/27/2017 Pharyngeal Phase Impaired Pharyngeal- Pudding Teaspoon -- Pharyngeal -- Pharyngeal- Pudding Cup -- Pharyngeal -- Pharyngeal- Honey Teaspoon -- Pharyngeal -- Pharyngeal- Honey Cup -- Pharyngeal -- Pharyngeal- Nectar Teaspoon -- Pharyngeal -- Pharyngeal- Nectar Cup Reduced tongue base retraction;Pharyngeal residue - valleculae Pharyngeal -- Pharyngeal- Nectar Straw -- Pharyngeal -- Pharyngeal- Thin Teaspoon -- Pharyngeal -- Pharyngeal- Thin Cup Pharyngeal residue - valleculae;Reduced tongue base retraction;Penetration/Aspiration during swallow;Reduced laryngeal elevation Pharyngeal Material enters airway, remains ABOVE vocal cords and not ejected out Pharyngeal- Thin Straw Reduced tongue base retraction;Pharyngeal residue - valleculae;Reduced laryngeal elevation;Reduced airway/laryngeal closure Pharyngeal -- Pharyngeal- Puree WFL Pharyngeal -- Pharyngeal- Mechanical Soft -- Pharyngeal -- Pharyngeal- Regular WFL Pharyngeal -- Pharyngeal- Multi-consistency -- Pharyngeal --  Pharyngeal- Pill WFL Pharyngeal -- Pharyngeal Comment --  CHL IP CERVICAL ESOPHAGEAL PHASE 10/27/2017 Cervical Esophageal Phase Impaired Pudding Teaspoon -- Pudding Cup -- Honey Teaspoon -- Honey Cup -- Nectar Teaspoon -- Nectar Cup -- Nectar Straw -- Thin Teaspoon -- Thin Cup Reduced cricopharyngeal relaxation;Prominent cricopharyngeal segment Thin Straw Reduced cricopharyngeal relaxation;Prominent cricopharyngeal segment Puree Reduced cricopharyngeal relaxation;Prominent cricopharyngeal segment Mechanical Soft -- Regular Reduced cricopharyngeal relaxation;Prominent cricopharyngeal segment Multi-consistency -- Pill Reduced cricopharyngeal relaxation;Prominent cricopharyngeal segment Cervical Esophageal Comment barium tablet taken with thin easily transited though pharynx and esophagus, pt did appear to have minimal stasis at mid-esophagus without sensation, this eventually cleared independently No flowsheet data found. Janett Labella [place], [facility] SLP [phone]              Echocardiogram.    Subjective: Back pain improved. Breathing has improved and weaned off oxygen.   Discharge Exam: Vitals:   10/31/17 1316 10/31/17 1424  BP:  103/61  Pulse:  (!) 52  Resp:    Temp:    SpO2: (!) 89%    Vitals:   10/31/17 1302 10/31/17 1311 10/31/17 1316 10/31/17 1424  BP: (!) 86/63 (!) 93/56  103/61  Pulse: 67 73  (!) 52  Resp: 16     Temp:      TempSrc:      SpO2: 97% (!) 89% (!) 89%   Weight:      Height:        General: Pt is alert, awake, not in acute distress Cardiovascular: RRR, S1/S2 +, no rubs, no gallops Respiratory: CTA bilaterally, no wheezing, no rhonchi Abdominal: Soft, NT, ND, bowel sounds + Extremities: no edema, no cyanosis    The results of significant diagnostics from this hospitalization (including imaging, microbiology, ancillary and laboratory) are listed below for reference.     Microbiology: Recent Results (from the past 240 hour(s))  Urine culture      Status: None   Collection Time: 10/25/17 12:53 PM  Result Value Ref Range Status   Specimen Description   Final    URINE, CLEAN CATCH Performed at [facility], [place] [address] [place], [place] [address]    Special Requests   Final    Normal Performed at [facility], [place] [address] [place], [place] [address]    Culture  Final    NO GROWTH Performed at [facility], [place] [address], [place], [place] [address]    Report Status 10/26/2017 FINAL  Final     Labs: BNP (last 3 results) Recent Labs    10/25/17 1253 10/26/17 1821  BNP 398.5* 962.2*   Basic Metabolic Panel: Recent Labs  Lab 10/27/17 0541 10/28/17 0527 10/29/17 1643 10/30/17 0509 10/30/17 1736 10/31/17 0504  NA 133* 131* 133* 131*  --  134*  K 3.5 3.3* 3.1* 2.8* 3.5 3.8  CL 91* 85* 85* 85*  --  87*  CO2 29 31 33* 34*  --  34*  GLUCOSE 133* 138* 110* 119*  --  125*  BUN 16 28* 46* 47*  --  57*  CREATININE 0.84 0.99 1.02 0.90  --  0.92  CALCIUM 9.8 9.4 9.3 8.7*  --  8.7*  MG 1.7 1.9  --  2.3  --   --    Liver Function Tests: Recent Labs  Lab 10/25/17 1253  AST 30  ALT 18  ALKPHOS 88  BILITOT 1.2  PROT 7.3  ALBUMIN 3.8   No results for input(s): LIPASE, AMYLASE in the last 168 hours. No results for input(s): AMMONIA in the last 168 hours. CBC: Recent Labs  Lab 10/25/17 1253 10/27/17 0541  WBC 8.3 5.7  NEUTROABS 5.8  --   HGB 13.7 14.7  HCT 40.9 43.5  MCV 92.5 92.0  PLT 163 167   Cardiac Enzymes: No results for input(s): CKTOTAL, CKMB, CKMBINDEX, TROPONINI in the last 168 hours. BNP: Invalid input(s): POCBNP CBG: Recent Labs  Lab 10/25/17 1148  GLUCAP 89   D-Dimer No results for input(s): DDIMER in the last 72 hours. Hgb A1c No results for input(s): HGBA1C in the last 72 hours. Lipid Profile No results for input(s): CHOL, HDL, LDLCALC, TRIG, CHOLHDL, LDLDIRECT in the last 72 hours. Thyroid function studies No results for input(s):  TSH, T4TOTAL, T3FREE, THYROIDAB in the last 72 hours.  Invalid input(s): FREET3 Anemia work up No results for input(s): VITAMINB12, FOLATE, FERRITIN, TIBC, IRON, RETICCTPCT in the last 72 hours. Urinalysis    Component Value Date/Time   COLORURINE STRAW (A) 10/25/2017 1253   APPEARANCEUR CLEAR 10/25/2017 1253   LABSPEC 1.004 (L) 10/25/2017 1253   PHURINE 7.0 10/25/2017 1253   GLUCOSEU NEGATIVE 10/25/2017 1253   HGBUR NEGATIVE 10/25/2017 [place] 10/25/2017 1253   KETONESUR NEGATIVE 10/25/2017 1253   PROTEINUR NEGATIVE 10/25/2017 1253   UROBILINOGEN 1.0 11/11/2013 0125   NITRITE NEGATIVE 10/25/2017 1253   LEUKOCYTESUR NEGATIVE 10/25/2017 1253   Sepsis Labs Invalid input(s): PROCALCITONIN,  WBC,  LACTICIDVEN Microbiology Recent Results (from the past 240 hour(s))  Urine culture     Status: None   Collection Time: 10/25/17 12:53 PM  Result Value Ref Range Status   Specimen Description   Final    URINE, CLEAN CATCH Performed at [facility], [place] [address] [place], [place] [address]    Special Requests   Final    Normal Performed at [facility], [place] [address] [place], [place] [address]    Culture   Final    NO GROWTH Performed at [facility], [place] [address], [place], [place] [address]    Report Status 10/26/2017 FINAL  Final     Time coordinating discharge: 35  minutes  SIGNED:   Hosie Poisson, MD  [facility] 10/31/2017, 3:17 PM Pager   If 7PM-7AM, please contact night-coverage  [email or web address] Password [email or web address]

## 2017-10-31 NOTE — Progress Notes (Signed)
Discharge report called to [facility] at [facility]. Awaiting transportation via [facility]. Eulas Post, RN

## 2017-10-31 NOTE — Progress Notes (Signed)
Clinical Social Worker facilitated patient discharge including contacting patient family and facility to confirm patient discharge plans.  Clinical information faxed to facility and family agreeable with plan.  CSW arranged ambulance transport via [facility] to [facility] .  RN to call [phone] (pt will go in room 402 and ask for nurse Deb) for report prior to discharge.  Clinical Social Worker will sign off for now as social work intervention is no longer needed. Please consult [place] again if new need arises.  [name], MSW, [place]

## 2017-11-01 DIAGNOSIS — J449 Chronic obstructive pulmonary disease, unspecified: Secondary | ICD-10-CM | POA: Diagnosis not present

## 2017-11-01 DIAGNOSIS — R2689 Other abnormalities of gait and mobility: Secondary | ICD-10-CM | POA: Diagnosis not present

## 2017-11-01 DIAGNOSIS — I480 Paroxysmal atrial fibrillation: Secondary | ICD-10-CM | POA: Diagnosis not present

## 2017-11-01 DIAGNOSIS — R0609 Other forms of dyspnea: Secondary | ICD-10-CM | POA: Diagnosis not present

## 2017-11-01 DIAGNOSIS — K219 Gastro-esophageal reflux disease without esophagitis: Secondary | ICD-10-CM | POA: Diagnosis not present

## 2017-11-01 DIAGNOSIS — R682 Dry mouth, unspecified: Secondary | ICD-10-CM | POA: Diagnosis not present

## 2017-11-01 LAB — QUANTIFERON-TB GOLD PLUS (RQFGPL)
QUANTIFERON TB1 AG VALUE: 0.01 [IU]/mL
QuantiFERON Mitogen Value: 0.07 IU/mL
QuantiFERON Nil Value: 0.02 IU/mL
QuantiFERON TB2 Ag Value: 0.01 IU/mL

## 2017-11-01 LAB — QUANTIFERON-TB GOLD PLUS: QuantiFERON-TB Gold Plus: UNDETERMINED

## 2017-11-05 ENCOUNTER — Encounter (HOSPITAL_BASED_OUTPATIENT_CLINIC_OR_DEPARTMENT_OTHER): Payer: Medicare Other

## 2017-11-06 DIAGNOSIS — M25571 Pain in right ankle and joints of right foot: Secondary | ICD-10-CM | POA: Diagnosis not present

## 2017-11-08 ENCOUNTER — Encounter (HOSPITAL_COMMUNITY): Payer: Self-pay | Admitting: Emergency Medicine

## 2017-11-08 ENCOUNTER — Emergency Department (HOSPITAL_COMMUNITY): Payer: Medicare Other

## 2017-11-08 ENCOUNTER — Inpatient Hospital Stay (HOSPITAL_COMMUNITY)
Admission: EM | Admit: 2017-11-08 | Discharge: 2017-11-12 | DRG: 640 | Disposition: A | Discharge status: Skilled Nursing Facility | Payer: Medicare Other | Attending: Internal Medicine | Admitting: Internal Medicine

## 2017-11-08 ENCOUNTER — Other Ambulatory Visit: Payer: Self-pay

## 2017-11-08 DIAGNOSIS — M351 Other overlap syndromes: Secondary | ICD-10-CM | POA: Diagnosis present

## 2017-11-08 DIAGNOSIS — Z9089 Acquired absence of other organs: Secondary | ICD-10-CM

## 2017-11-08 DIAGNOSIS — J90 Pleural effusion, not elsewhere classified: Secondary | ICD-10-CM | POA: Diagnosis not present

## 2017-11-08 DIAGNOSIS — E860 Dehydration: Secondary | ICD-10-CM | POA: Diagnosis not present

## 2017-11-08 DIAGNOSIS — B9689 Other specified bacterial agents as the cause of diseases classified elsewhere: Secondary | ICD-10-CM | POA: Diagnosis present

## 2017-11-08 DIAGNOSIS — Z801 Family history of malignant neoplasm of trachea, bronchus and lung: Secondary | ICD-10-CM

## 2017-11-08 DIAGNOSIS — D472 Monoclonal gammopathy: Secondary | ICD-10-CM | POA: Diagnosis present

## 2017-11-08 DIAGNOSIS — I110 Hypertensive heart disease with heart failure: Secondary | ICD-10-CM | POA: Diagnosis present

## 2017-11-08 DIAGNOSIS — L97929 Non-pressure chronic ulcer of unspecified part of left lower leg with unspecified severity: Secondary | ICD-10-CM | POA: Diagnosis not present

## 2017-11-08 DIAGNOSIS — K219 Gastro-esophageal reflux disease without esophagitis: Secondary | ICD-10-CM | POA: Diagnosis present

## 2017-11-08 DIAGNOSIS — R55 Syncope and collapse: Secondary | ICD-10-CM | POA: Diagnosis not present

## 2017-11-08 DIAGNOSIS — Z888 Allergy status to other drugs, medicaments and biological substances status: Secondary | ICD-10-CM

## 2017-11-08 DIAGNOSIS — I872 Venous insufficiency (chronic) (peripheral): Secondary | ICD-10-CM | POA: Diagnosis not present

## 2017-11-08 DIAGNOSIS — M48061 Spinal stenosis, lumbar region without neurogenic claudication: Secondary | ICD-10-CM | POA: Diagnosis present

## 2017-11-08 DIAGNOSIS — Z66 Do not resuscitate: Secondary | ICD-10-CM | POA: Diagnosis present

## 2017-11-08 DIAGNOSIS — K5900 Constipation, unspecified: Secondary | ICD-10-CM | POA: Diagnosis present

## 2017-11-08 DIAGNOSIS — I481 Persistent atrial fibrillation: Secondary | ICD-10-CM | POA: Diagnosis not present

## 2017-11-08 DIAGNOSIS — N281 Cyst of kidney, acquired: Secondary | ICD-10-CM | POA: Diagnosis present

## 2017-11-08 DIAGNOSIS — L97919 Non-pressure chronic ulcer of unspecified part of right lower leg with unspecified severity: Secondary | ICD-10-CM | POA: Diagnosis not present

## 2017-11-08 DIAGNOSIS — I5023 Acute on chronic systolic (congestive) heart failure: Secondary | ICD-10-CM | POA: Diagnosis not present

## 2017-11-08 DIAGNOSIS — Z79890 Hormone replacement therapy (postmenopausal): Secondary | ICD-10-CM

## 2017-11-08 DIAGNOSIS — E876 Hypokalemia: Secondary | ICD-10-CM | POA: Diagnosis present

## 2017-11-08 DIAGNOSIS — G4733 Obstructive sleep apnea (adult) (pediatric): Secondary | ICD-10-CM | POA: Diagnosis present

## 2017-11-08 DIAGNOSIS — G9341 Metabolic encephalopathy: Secondary | ICD-10-CM | POA: Diagnosis present

## 2017-11-08 DIAGNOSIS — J9811 Atelectasis: Secondary | ICD-10-CM | POA: Diagnosis not present

## 2017-11-08 DIAGNOSIS — Z96653 Presence of artificial knee joint, bilateral: Secondary | ICD-10-CM | POA: Diagnosis present

## 2017-11-08 DIAGNOSIS — I472 Ventricular tachycardia: Secondary | ICD-10-CM | POA: Diagnosis present

## 2017-11-08 DIAGNOSIS — J449 Chronic obstructive pulmonary disease, unspecified: Secondary | ICD-10-CM | POA: Diagnosis present

## 2017-11-08 DIAGNOSIS — Z833 Family history of diabetes mellitus: Secondary | ICD-10-CM

## 2017-11-08 DIAGNOSIS — I83029 Varicose veins of left lower extremity with ulcer of unspecified site: Secondary | ICD-10-CM | POA: Diagnosis present

## 2017-11-08 DIAGNOSIS — I4891 Unspecified atrial fibrillation: Secondary | ICD-10-CM | POA: Diagnosis present

## 2017-11-08 DIAGNOSIS — I509 Heart failure, unspecified: Secondary | ICD-10-CM | POA: Diagnosis not present

## 2017-11-08 DIAGNOSIS — E039 Hypothyroidism, unspecified: Secondary | ICD-10-CM | POA: Diagnosis not present

## 2017-11-08 DIAGNOSIS — I351 Nonrheumatic aortic (valve) insufficiency: Secondary | ICD-10-CM | POA: Diagnosis present

## 2017-11-08 DIAGNOSIS — I482 Chronic atrial fibrillation: Secondary | ICD-10-CM | POA: Diagnosis not present

## 2017-11-08 DIAGNOSIS — Z8249 Family history of ischemic heart disease and other diseases of the circulatory system: Secondary | ICD-10-CM

## 2017-11-08 DIAGNOSIS — E871 Hypo-osmolality and hyponatremia: Secondary | ICD-10-CM | POA: Diagnosis present

## 2017-11-08 DIAGNOSIS — I5032 Chronic diastolic (congestive) heart failure: Secondary | ICD-10-CM | POA: Diagnosis not present

## 2017-11-08 DIAGNOSIS — M2550 Pain in unspecified joint: Secondary | ICD-10-CM | POA: Diagnosis not present

## 2017-11-08 DIAGNOSIS — M4802 Spinal stenosis, cervical region: Secondary | ICD-10-CM | POA: Diagnosis present

## 2017-11-08 DIAGNOSIS — N390 Urinary tract infection, site not specified: Secondary | ICD-10-CM | POA: Diagnosis present

## 2017-11-08 DIAGNOSIS — Z7401 Bed confinement status: Secondary | ICD-10-CM | POA: Diagnosis not present

## 2017-11-08 DIAGNOSIS — R0902 Hypoxemia: Secondary | ICD-10-CM | POA: Diagnosis not present

## 2017-11-08 DIAGNOSIS — I10 Essential (primary) hypertension: Secondary | ICD-10-CM | POA: Diagnosis present

## 2017-11-08 DIAGNOSIS — Z9981 Dependence on supplemental oxygen: Secondary | ICD-10-CM

## 2017-11-08 DIAGNOSIS — N3000 Acute cystitis without hematuria: Secondary | ICD-10-CM | POA: Diagnosis not present

## 2017-11-08 DIAGNOSIS — R410 Disorientation, unspecified: Secondary | ICD-10-CM | POA: Diagnosis not present

## 2017-11-08 DIAGNOSIS — Z86010 Personal history of colonic polyps: Secondary | ICD-10-CM

## 2017-11-08 DIAGNOSIS — Z7982 Long term (current) use of aspirin: Secondary | ICD-10-CM

## 2017-11-08 DIAGNOSIS — R531 Weakness: Secondary | ICD-10-CM

## 2017-11-08 DIAGNOSIS — T502X5A Adverse effect of carbonic-anhydrase inhibitors, benzothiadiazides and other diuretics, initial encounter: Secondary | ICD-10-CM | POA: Diagnosis present

## 2017-11-08 DIAGNOSIS — K5730 Diverticulosis of large intestine without perforation or abscess without bleeding: Secondary | ICD-10-CM | POA: Diagnosis present

## 2017-11-08 DIAGNOSIS — R748 Abnormal levels of other serum enzymes: Secondary | ICD-10-CM | POA: Diagnosis not present

## 2017-11-08 DIAGNOSIS — Z87891 Personal history of nicotine dependence: Secondary | ICD-10-CM

## 2017-11-08 DIAGNOSIS — R001 Bradycardia, unspecified: Secondary | ICD-10-CM | POA: Diagnosis present

## 2017-11-08 LAB — I-STAT ARTERIAL BLOOD GAS, ED
Acid-Base Excess: 16 mmol/L — ABNORMAL HIGH (ref 0.0–2.0)
Bicarbonate: 40.6 mmol/L — ABNORMAL HIGH (ref 20.0–28.0)
O2 SAT: 98 %
PCO2 ART: 46.3 mmHg (ref 32.0–48.0)
TCO2: 42 mmol/L — AB (ref 22–32)
pH, Arterial: 7.552 — ABNORMAL HIGH (ref 7.350–7.450)
pO2, Arterial: 91 mmHg (ref 83.0–108.0)

## 2017-11-08 LAB — CBC WITH DIFFERENTIAL/PLATELET
ABS IMMATURE GRANULOCYTES: 0.2 10*3/uL — AB (ref 0.0–0.1)
Basophils Absolute: 0 10*3/uL (ref 0.0–0.1)
Basophils Relative: 0 %
Eosinophils Absolute: 0.2 10*3/uL (ref 0.0–0.7)
Eosinophils Relative: 1 %
HCT: 45.1 % (ref 39.0–52.0)
HEMOGLOBIN: 15.1 g/dL (ref 13.0–17.0)
Immature Granulocytes: 1 %
LYMPHS PCT: 7 %
Lymphs Abs: 1.2 10*3/uL (ref 0.7–4.0)
MCH: 30 pg (ref 26.0–34.0)
MCHC: 33.5 g/dL (ref 30.0–36.0)
MCV: 89.7 fL (ref 78.0–100.0)
MONO ABS: 1.6 10*3/uL — AB (ref 0.1–1.0)
MONOS PCT: 9 %
NEUTROS ABS: 15.2 10*3/uL — AB (ref 1.7–7.7)
Neutrophils Relative %: 82 %
PLATELETS: 227 10*3/uL (ref 150–400)
RBC: 5.03 MIL/uL (ref 4.22–5.81)
RDW: 12.7 % (ref 11.5–15.5)
WBC: 18.4 10*3/uL — ABNORMAL HIGH (ref 4.0–10.5)

## 2017-11-08 LAB — URINALYSIS, ROUTINE W REFLEX MICROSCOPIC
Bilirubin Urine: NEGATIVE
GLUCOSE, UA: NEGATIVE mg/dL
Ketones, ur: NEGATIVE mg/dL
NITRITE: NEGATIVE
PH: 7 (ref 5.0–8.0)
PROTEIN: NEGATIVE mg/dL
SPECIFIC GRAVITY, URINE: 1.01 (ref 1.005–1.030)

## 2017-11-08 LAB — BASIC METABOLIC PANEL
ANION GAP: 12 (ref 5–15)
BUN: 29 mg/dL — ABNORMAL HIGH (ref 6–20)
CALCIUM: 8.6 mg/dL — AB (ref 8.9–10.3)
CO2: 38 mmol/L — ABNORMAL HIGH (ref 22–32)
Chloride: 84 mmol/L — ABNORMAL LOW (ref 101–111)
Creatinine, Ser: 0.88 mg/dL (ref 0.61–1.24)
GFR calc Af Amer: >60 mL/min (ref >60)
Glucose, Bld: 122 mg/dL — ABNORMAL HIGH (ref 65–99)
POTASSIUM: 2.9 mmol/L — AB (ref 3.5–5.1)
SODIUM: 134 mmol/L — AB (ref 135–145)

## 2017-11-08 LAB — T4, FREE: Free T4: 1.76 ng/dL (ref 0.82–1.77)

## 2017-11-08 LAB — I-STAT TROPONIN, ED: TROPONIN I, POC: 0.24 ng/mL — AB (ref 0.00–0.08)

## 2017-11-08 LAB — MRSA PCR SCREENING: MRSA by PCR: NEGATIVE

## 2017-11-08 LAB — TSH: TSH: 1.951 u[IU]/mL (ref 0.350–4.500)

## 2017-11-08 LAB — I-STAT CG4 LACTIC ACID, ED
LACTIC ACID, VENOUS: 0.99 mmol/L (ref 0.5–1.9)
Lactic Acid, Venous: 2.53 mmol/L (ref 0.5–1.9)

## 2017-11-08 MED ORDER — POTASSIUM CHLORIDE IN NACL 40-0.9 MEQ/L-% IV SOLN
INTRAVENOUS | Status: DC
  Administered 2017-11-08: 100 mL/h via INTRAVENOUS
  Filled 2017-11-08 (×4): qty 1000

## 2017-11-08 MED ORDER — BISACODYL 10 MG RE SUPP: 10.0000 mg | Freq: Once | RECTAL | Status: DC | PRN

## 2017-11-08 MED ORDER — B COMPLEX VITAMINS PO CAPS
1.0000 | ORAL_CAPSULE | Freq: Every day | ORAL | Status: DC
  Filled 2017-11-08 (×2): qty 1

## 2017-11-08 MED ORDER — SODIUM CHLORIDE 0.9 % IV SOLN
1.0000 g | Freq: Once | INTRAVENOUS | Status: AC
  Administered 2017-11-08: 1 g via INTRAVENOUS
  Filled 2017-11-08: qty 10

## 2017-11-08 MED ORDER — PANTOPRAZOLE SODIUM 40 MG PO TBEC
40.0000 mg | DELAYED_RELEASE_TABLET | Freq: Every day | ORAL | Status: DC
  Administered 2017-11-09 – 2017-11-12 (×4): 40 mg via ORAL
  Filled 2017-11-08 (×4): qty 1

## 2017-11-08 MED ORDER — POTASSIUM CHLORIDE CRYS ER 20 MEQ PO TBCR
40.0000 meq | EXTENDED_RELEASE_TABLET | Freq: Once | ORAL | Status: AC
  Administered 2017-11-08: 40 meq via ORAL
  Filled 2017-11-08: qty 2

## 2017-11-08 MED ORDER — HYDROCODONE-ACETAMINOPHEN 5-325 MG PO TABS
1.0000 | ORAL_TABLET | ORAL | Status: DC | PRN
  Administered 2017-11-10: 1 via ORAL
  Filled 2017-11-08: qty 1

## 2017-11-08 MED ORDER — TIOTROPIUM BROMIDE MONOHYDRATE 18 MCG IN CAPS
1.0000 | ORAL_CAPSULE | Freq: Every day | RESPIRATORY_TRACT | Status: DC
  Administered 2017-11-10 – 2017-11-12 (×3): 18 ug via RESPIRATORY_TRACT
  Filled 2017-11-08: qty 5

## 2017-11-08 MED ORDER — HEPARIN SODIUM (PORCINE) 5000 UNIT/ML IJ SOLN
5000.0000 [IU] | Freq: Three times a day (TID) | INTRAMUSCULAR | Status: DC
  Administered 2017-11-08 – 2017-11-09 (×2): 5000 [IU] via SUBCUTANEOUS
  Filled 2017-11-08 (×3): qty 1

## 2017-11-08 MED ORDER — ACETAMINOPHEN 325 MG PO TABS: 650.0000 mg | ORAL_TABLET | Freq: Four times a day (QID) | ORAL | Status: DC | PRN

## 2017-11-08 MED ORDER — IPRATROPIUM-ALBUTEROL 0.5-2.5 (3) MG/3ML IN SOLN: 3.0000 mL | Freq: Four times a day (QID) | RESPIRATORY_TRACT | Status: DC

## 2017-11-08 MED ORDER — BISACODYL 10 MG RE SUPP: 10.0000 mg | Freq: Every day | RECTAL | Status: DC | PRN

## 2017-11-08 MED ORDER — AZELASTINE HCL 0.1 % NA SOLN
1.0000 | Freq: Two times a day (BID) | NASAL | Status: DC
  Administered 2017-11-09 – 2017-11-12 (×6): 1 via NASAL
  Filled 2017-11-08 (×2): qty 30

## 2017-11-08 MED ORDER — VITAMIN D 1000 UNITS PO TABS
4000.0000 [IU] | ORAL_TABLET | Freq: Every day | ORAL | Status: DC
  Administered 2017-11-09 – 2017-11-12 (×4): 4000 [IU] via ORAL
  Filled 2017-11-08 (×6): qty 4

## 2017-11-08 MED ORDER — POTASSIUM CHLORIDE 10 MEQ/100ML IV SOLN
10.0000 meq | Freq: Once | INTRAVENOUS | Status: AC
  Administered 2017-11-08: 10 meq via INTRAVENOUS
  Filled 2017-11-08: qty 100

## 2017-11-08 MED ORDER — ACETAMINOPHEN 500 MG PO TABS
1000.0000 mg | ORAL_TABLET | Freq: Once | ORAL | Status: AC
  Administered 2017-11-08: 1000 mg via ORAL
  Filled 2017-11-08: qty 2

## 2017-11-08 MED ORDER — CARVEDILOL 12.5 MG PO TABS
6.2500 mg | ORAL_TABLET | Freq: Two times a day (BID) | ORAL | Status: DC
  Administered 2017-11-09 – 2017-11-12 (×6): 6.25 mg via ORAL
  Filled 2017-11-08 (×6): qty 1

## 2017-11-08 MED ORDER — MOMETASONE FURO-FORMOTEROL FUM 200-5 MCG/ACT IN AERO
2.0000 | INHALATION_SPRAY | Freq: Two times a day (BID) | RESPIRATORY_TRACT | Status: DC
  Administered 2017-11-09 – 2017-11-12 (×7): 2 via RESPIRATORY_TRACT
  Filled 2017-11-08: qty 8.8

## 2017-11-08 MED ORDER — DOXYCYCLINE HYCLATE 100 MG PO TABS: 100.0000 mg | ORAL_TABLET | Freq: Two times a day (BID) | ORAL | Status: DC

## 2017-11-08 MED ORDER — ASPIRIN EC 81 MG PO TBEC
81.0000 mg | DELAYED_RELEASE_TABLET | ORAL | Status: DC
  Administered 2017-11-09 – 2017-11-11 (×2): 81 mg via ORAL
  Filled 2017-11-08 (×2): qty 1

## 2017-11-08 MED ORDER — ALBUTEROL SULFATE HFA 108 (90 BASE) MCG/ACT IN AERS: 2.0000 | INHALATION_SPRAY | Freq: Four times a day (QID) | RESPIRATORY_TRACT | Status: DC | PRN

## 2017-11-08 MED ORDER — DOCUSATE SODIUM 100 MG PO CAPS
100.0000 mg | ORAL_CAPSULE | Freq: Two times a day (BID) | ORAL | Status: DC
  Administered 2017-11-08 – 2017-11-12 (×8): 100 mg via ORAL
  Filled 2017-11-08 (×7): qty 1

## 2017-11-08 MED ORDER — DOCUSATE SODIUM 100 MG PO CAPS
100.0000 mg | ORAL_CAPSULE | Freq: Two times a day (BID) | ORAL | Status: DC
  Filled 2017-11-08 (×2): qty 1

## 2017-11-08 MED ORDER — ALBUTEROL SULFATE (2.5 MG/3ML) 0.083% IN NEBU: 2.5000 mg | INHALATION_SOLUTION | RESPIRATORY_TRACT | Status: DC

## 2017-11-08 MED ORDER — OMEGA-3 FATTY ACIDS 1000 MG PO CAPS
1.0000 g | ORAL_CAPSULE | Freq: Two times a day (BID) | ORAL | Status: DC
  Administered 2017-11-08: 1 g via ORAL
  Filled 2017-11-08 (×3): qty 1

## 2017-11-08 MED ORDER — ADULT MULTIVITAMIN W/MINERALS CH
1.0000 | ORAL_TABLET | Freq: Every day | ORAL | Status: DC
  Administered 2017-11-09 – 2017-11-12 (×4): 1 via ORAL
  Filled 2017-11-08 (×4): qty 1

## 2017-11-08 MED ORDER — CALCIUM CARBONATE ANTACID 500 MG PO CHEW
2.0000 | CHEWABLE_TABLET | Freq: Three times a day (TID) | ORAL | Status: DC | PRN
  Administered 2017-11-09: 400 mg via ORAL
  Filled 2017-11-08: qty 2

## 2017-11-08 MED ORDER — LACTATED RINGERS IV BOLUS
1000.0000 mL | Freq: Once | INTRAVENOUS | Status: AC
  Administered 2017-11-08: 1000 mL via INTRAVENOUS

## 2017-11-08 MED ORDER — CALCIUM CARBONATE ANTACID 500 MG PO CHEW
1.0000 | CHEWABLE_TABLET | Freq: Once | ORAL | Status: AC
  Administered 2017-11-08: 200 mg via ORAL
  Filled 2017-11-08: qty 1

## 2017-11-08 MED ORDER — ACETAMINOPHEN 650 MG RE SUPP: 650.0000 mg | Freq: Four times a day (QID) | RECTAL | Status: DC | PRN

## 2017-11-08 MED ORDER — SODIUM CHLORIDE 0.9 % IV SOLN
1.0000 g | INTRAVENOUS | Status: DC
  Administered 2017-11-08 – 2017-11-11 (×3): 1 g via INTRAVENOUS
  Filled 2017-11-08 (×4): qty 10

## 2017-11-08 NOTE — H&P (Addendum)
History and Physical    Richard Rosales:[id number] DOB: 14-Dec-1930 DOA: 11/08/2017  PCP: Unk Pinto, MD   Patient coming from: Skilled nursing facility  Chief Complaint: Weakness and confusion  HPI: Richard Rosales is a 84 y.o. male with medical history significant of hypertension, atrial fibrillation not on anticoagulation, COPD on 2 L of nasal cannula at baseline, congestive heart failure, hypothyroidism, history of MGUS, sleep apnea on CPAP and venous stasis presented to the hospital with increasing weakness and difficulty ambulation.  Patient is currently at the skilled nursing facility.  He was recently admitted to the hospital for bilateral lower extremity weakness and had cervical spinal stenosis and was started on Decadron.  Patient was then discharged to skilled nursing facility for continued rehabilitation.  EMS was called in the day because of weakness and confusion at the skilled nursing facility.  EMS noted that patient had bradycardia and few runs of V. tach.  Patient however denied any chest pain, palpitation, shortness of breath fever or chills.  Denies nausea vomiting or diarrhea.  He is however constipated over the last several days.  Planes of mild dysuria.  Patient has diminished oral intake for last few days.  At the rehab, he has been walking few steps with help.  There is no mention of increasing cough sputum production fever or chills.  Patient denies any chest pain palpitation.  ED Course:  In the ED, patient was noted to have hypokalemia, hyponatremia and leukocytosis.  There was some concern for atelectasis in the chest x-ray and lactate was slightly elevated.  Patient received 10 mEq of IV potassium and 40 oral and was considered for admission to the hospital for electrolyte imbalances, weakness confusion.  Review of Systems: As per HPI otherwise 10 point review of systems negative.    Past Medical History:  Diagnosis Date  . Acute bronchitis   . Adenomatous  colon polyp   . Allergic rhinitis   . Anemia    iron deficient  . Atrial fibrillation ([place])   . Atrial flutter ([place])   . Chronic gastritis   . COPD (chronic obstructive pulmonary disease) ([place])   . Diastolic dysfunction   . Diverticulosis of colon    recurrent GI bleeding  . DJD (degenerative joint disease)   . GERD (gastroesophageal reflux disease)   . GI bleed   . HTN (hypertension)   . Hyperplastic colon polyp 2003  . Hypothyroidism   . Moderate aortic insufficiency   . OSA (obstructive sleep apnea)    compliants w/ CPAP  . Positive PPD    remote  . Shortness of breath   . Venous insufficiency     Past Surgical History:  Procedure Laterality Date  . CARDIAC CATHETERIZATION  7.27.05   revealed a preserved EF with no CAD  . COLONOSCOPY    . HERNIA REPAIR Bilateral   . JOINT REPLACEMENT  2001   lt total knee-rt one done 00  . NASAL SINUS SURGERY    . POLYPECTOMY     adenomatous polyps 2010  . SHOULDER ARTHROSCOPY    . TOE SURGERY Right   . TONSILLECTOMY    . TOTAL KNEE ARTHROPLASTY     bilateral     reports that he quit smoking about 39 years ago. His smoking use included cigarettes. He has a 20.00 pack-year smoking history. He has never used smokeless tobacco. He reports that he drinks alcohol. He reports that he does not use drugs.  Allergies  Allergen Reactions  .  Ace Inhibitors Cough  . Beta Adrenergic Blockers Other (See Comments)    bradycardia  . Decadron [Dexamethasone]     unknown  . Fluticasone-Salmeterol     Doesn't remember  . Hytrin [Terazosin]     Nasal congestion   . Prednisone     Shakes / tremors  . Vioxx [Rofecoxib] Other (See Comments)    dyspepsia    Family History  Problem Relation Age of Onset  . Hypertension Mother   . Diabetes Father   . Heart failure Father   . Arrhythmia Sister 56  . Lung cancer Brother        chemical     Prior to Admission medications   Medication Sig Start Date End Date Taking? Authorizing  Provider  albuterol (PROVENTIL HFA;VENTOLIN HFA) 108 (90 Base) MCG/ACT inhaler Inhale 2 puffs into the lungs every 6 (six) hours as needed for wheezing or shortness of breath. 08/21/15  Yes Collene Gobble, MD  albuterol (PROVENTIL) (2.5 MG/3ML) 0.083% nebulizer solution Take 3 mLs (2.5 mg total) by nebulization every 4 (four) hours as needed for wheezing or shortness of breath. Patient taking differently: Take 2.5 mg by nebulization See admin instructions. Inhale one vial (2.5 mg) via nebulization every 4 hours while awake 10/31/17  Yes Hosie Poisson, MD  aspirin EC 81 MG tablet Take 81 mg by mouth every Monday, Wednesday, and Friday.   Yes [provider]  azelastine (ASTELIN) 0.1 % nasal spray USE 1 SPRAY IN EACH NOSTRIL TWICE A DAY 07/29/17  Yes Parrett, Tammy S, NP  b complex vitamins capsule Take 1 capsule by mouth daily.   Yes [provider]  bisacodyl (DULCOLAX) 10 MG suppository Place 10 mg rectally once as needed (if no BM in 8 hours after MOM administered).   Yes [provider]  calcium carbonate (TUMS - DOSED IN MG ELEMENTAL CALCIUM) 500 MG chewable tablet Chew 2 tablets by mouth 3 (three) times daily as needed for indigestion.   Yes [provider]  carvedilol (COREG) 6.25 MG tablet Take 1 tablet (6.25 mg total) by mouth 2 (two) times daily with a meal. 10/31/17  Yes Hosie Poisson, MD  Cholecalciferol (VITAMIN D) 2000 units tablet Take 4,000 Units by mouth daily.   Yes [provider]  docusate sodium (COLACE) 100 MG capsule Take 100 mg by mouth 2 (two) times daily.     Yes [provider]  doxycycline (VIBRA-TABS) 100 MG tablet Take 100 mg by mouth 2 (two) times daily. 10 day course started 11/06/17 pm   Yes [provider]  fish oil-omega-3 fatty acids 1000 MG capsule Take 1 g by mouth 2 (two) times daily.     Yes [provider]  furosemide (LASIX) 20 MG tablet Take 3 tablets (60 mg total) by mouth 2 (two) times  daily. Patient taking differently: Take 60 mg by mouth daily.  10/31/17  Yes Hosie Poisson, MD  GLUCOSAMINE-CHONDROITIN PO Take 1 capsule by mouth daily.   Yes [provider]  HYDROcodone-acetaminophen (NORCO/VICODIN) 5-325 MG tablet Take 1-2 tablets by mouth every 4 (four) hours as needed for severe pain.   Yes [provider]  levothyroxine (SYNTHROID, LEVOTHROID) 100 MCG tablet TAKE 1 TABLET DAILY BEFORE BREAKFAST 06/17/17  Yes Liane Comber, NP  Magnesium Hydroxide (MILK OF MAGNESIA PO) Take 30 mLs by mouth once as needed (if no BM in 3 days).   Yes [provider]  metolazone (ZAROXOLYN) 5 MG tablet TAKE 1 TABLET DAILY AS  DIRECTED FOR FLUID AND SWELLING Patient taking differently: TAKE ONE TABLET (5 MG) BY MOUTH DAILY FOR CHF 04/25/17  Yes Unk Pinto, MD  Multiple Vitamin (MULTIVITAMIN WITH MINERALS) TABS tablet Take 1 tablet by mouth daily.   Yes [provider]  omeprazole (PRILOSEC) 40 MG capsule TAKE 1 CAPSULE TWICE A DAY Patient taking differently: TAKE 1 CAPSULE (40 MG) BY MOUTH TWICE DAILY WITH A MEAL 06/17/17  Yes Liane Comber, NP  OXYGEN Inhale 2 L into the lungs continuous.   Yes [provider]  potassium chloride (K-DUR) 10 MEQ tablet Take 1 tablet (10 mEq total) by mouth daily. Patient taking differently: Take 10-20 mEq by mouth See admin instructions. Order date 11/07/17: take 2 tablets (10 mg) by mouth twice daily for 3 days, then resume 1 tablet (10 mg) daily 04/09/17  Yes Parrett, Tammy S, NP  PRESCRIPTION MEDICATION Inhale into the lungs at bedtime. CPAP   Yes [provider]  Sodium Phosphates (FLEET ENEMA RE) Place 1 each rectally once as needed (if no BM within 2 hours after dulcolax suppository).   Yes [provider]  SPIRIVA HANDIHALER 18 MCG inhalation capsule INHALE THE CONTENTS OF 1 CAPSULE DAILY 08/18/17  Yes Collene Gobble, MD  SYMBICORT 160-4.5 MCG/ACT inhaler USE 2 INHALATIONS TWICE A DAY  10/26/17  Yes Byrum, Rose Fillers, MD  ammonium lactate (LAC-HYDRIN) 12 % lotion Apply topically every morning. Patient not taking: Reported on 11/08/2017 10/31/17   Hosie Poisson, MD    Physical Exam: Vitals:   11/08/17 1815 11/08/17 1915 11/08/17 1917 11/08/17 1948  BP: (!) 124/57 (!) 131/94 (!) 131/94 (!) 97/58  Pulse: 69 (!) 48 75 64  Resp: (!) 22 19 16 18   Temp:      TempSrc:      SpO2: 96% 95% 99% 97%    Constitutional: NAD, calm, comfortable Vitals:   11/08/17 1815 11/08/17 1915 11/08/17 1917 11/08/17 1948  BP: (!) 124/57 (!) 131/94 (!) 131/94 (!) 97/58  Pulse: 69 (!) 48 75 64  Resp: (!) 22 19 16 18   Temp:      TempSrc:      SpO2: 96% 95% 99% 97%   Eyes: PERRL, lids and conjunctivae normal ENMT: Mucous membranes are dry. Posterior pharynx clear of any exudate or lesions.Normal dentition.  Neck: normal, supple, no masses, no thyromegaly Respiratory: clear to auscultation bilaterally, no wheezing, no crackles. Normal respiratory effort. No accessory muscle use.  Cardiovascular: Regular rhythm, no murmurs / rubs / gallops.  2+ pedal pulses. No carotid bruits.  Abdomen: no tenderness, no masses palpated. No hepatosplenomegaly. Bowel sounds positive.  Musculoskeletal: no clubbing / cyanosis. No joint deformity upper and lower extremities. Good ROM, no contractures. Normal muscle tone.  Lower extremity with signs of venous insufficiency.  Some pitting edema. Skin: no rashes, lesions.  Lower extremity erythema no induration.  Diminished skin turgor Neurologic: CN 2-12 grossly intact. Sensation intact, DTR normal. Strength 5/5 in all 4.  Psychiatric: Normal judgment and insight. Alert and oriented x 3. Normal mood.   Foley Catheter: None  Labs on Admission: I have personally reviewed following labs and imaging studies  CBC: Recent Labs  Lab 11/08/17 1531  WBC 18.4*  NEUTROABS 15.2*  HGB 15.1  HCT 45.1  MCV 89.7  PLT 536   Basic Metabolic Panel: Recent Labs  Lab  11/08/17 1531  NA 134*  K 2.9*  CL 84*  CO2 38*  GLUCOSE 122*  BUN 29*  CREATININE 0.88  CALCIUM  8.6*   GFR: Estimated Creatinine Clearance: 73.9 mL/min (by C-G formula based on SCr of 0.88 mg/dL). Liver Function Tests: No results for input(s): AST, ALT, ALKPHOS, BILITOT, PROT, ALBUMIN in the last 168 hours. No results for input(s): LIPASE, AMYLASE in the last 168 hours. No results for input(s): AMMONIA in the last 168 hours. Coagulation Profile: No results for input(s): INR, PROTIME in the last 168 hours. Cardiac Enzymes: No results for input(s): CKTOTAL, CKMB, CKMBINDEX, TROPONINI in the last 168 hours. BNP (last 3 results) Recent Labs    04/07/17 1632  PROBNP 230.0*   HbA1C: No results for input(s): HGBA1C in the last 72 hours. CBG: No results for input(s): GLUCAP in the last 168 hours. Lipid Profile: No results for input(s): CHOL, HDL, LDLCALC, TRIG, CHOLHDL, LDLDIRECT in the last 72 hours. Thyroid Function Tests: Recent Labs    11/08/17 1531  TSH 1.951  FREET4 1.76   Anemia Panel: No results for input(s): VITAMINB12, FOLATE, FERRITIN, TIBC, IRON, RETICCTPCT in the last 72 hours. Urine analysis:    Component Value Date/Time   COLORURINE YELLOW 11/08/2017 1903   APPEARANCEUR HAZY (A) 11/08/2017 1903   LABSPEC 1.010 11/08/2017 1903   PHURINE 7.0 11/08/2017 1903   GLUCOSEU NEGATIVE 11/08/2017 1903   HGBUR SMALL (A) 11/08/2017 1903   BILIRUBINUR NEGATIVE 11/08/2017 1903   KETONESUR NEGATIVE 11/08/2017 1903   PROTEINUR NEGATIVE 11/08/2017 1903   UROBILINOGEN 1.0 11/11/2013 0125   NITRITE NEGATIVE 11/08/2017 1903   LEUKOCYTESUR MODERATE (A) 11/08/2017 1903    Radiological Exams on Admission: Dg Chest Portable 1 View  Result Date: 11/08/2017 CLINICAL DATA:  Patient with worsening lethargy. EXAM: PORTABLE CHEST 1 VIEW COMPARISON:  Chest radiograph 10/28/2017. FINDINGS: Limited rotated exam. Monitoring leads overlie the patient. Stable cardiomegaly. Similar  right paratracheal density. Thoracic aortic vascular calcifications. Low lung volumes. Bibasilar heterogeneous opacities. Mild interstitial opacities bilaterally. No definite pleural effusion or pneumothorax. IMPRESSION: Cardiomegaly. Interstitial opacities bilaterally may represent mild edema. Basilar atelectasis. Electronically Signed   By: Lovey Newcomer [name]   On: 11/08/2017 16:22    Assessment/Plan  Principal Problem:   Weakness Active Problems:   OSA and COPD overlap syndrome (HCC)   Essential hypertension   Atrial fibrillation (HCC)   Chronic diastolic heart failure (HCC)   COPD (chronic obstructive pulmonary disease) (HCC)   Hypothyroidism   Venous ulcers of both lower extremities (HCC)   Hypokalemia   A-fib (HCC)   Generalized weakness, debility deconditioning.  Likely secondary to volume depletion and electrolyte imbalance and recent history of cervical stenosis.  Patient does have history of cervical stenosis and was on prednisone.  Will get physical therapy, occupational therapy reevaluation.  Patient is currently at a skilled nursing facility.  TSH within normal limits.  Elevated lactate likely secondary to volume depletion.  We will continue IV fluid hydration.  Check lactate in a.m.  Severe hypokalemia likely secondary to diuretics.  We will continue to hold diuretics for now.  Patient has been started on IV fluids with potassium supplements.  Will check BMP and magnesium in a.m. as well.  History of hypertension.  Currently moderately hypotensive.  Will continue on IV fluids.  Hold antihypertensives for now.  Bradycardia and tachycardia with irregular rhythm from atrial atrial fibrillation, volume depletion and electrolyte imbalance, patient is not on anticoagulation.  We will correct the underlying causes.  History of chronic diastolic heart failure.  Was on diuretics at home.  Will keep on hold for now.  Continue CHF protocol.  History  of COPD and obstructive sleep  apnea.  Continue on oxygen nebulizers.  Not in exacerbation.  Resume CPAP.  Check ABG due to somnolence  Significant leukocytosis on presentation.  Very active as well.  Was on steroids recently, has UTI.  To be on IV Rocephin for now.  Follow WBC in a.m.  UTI.  Send urine cultures.  Patient received Rocephin in the ED.  Disposition:  skilled nursing facility.    Bilateral lower extremity venous stasis ulcer on presentation.  Continue supportive care  Because of multiple comorbidities Electrolyte imbalances, volume depletion patient would need more than 2 midnights in the hospital so the patient will be admitted as inpatient.  DVT prophylaxis: Heparin subcu Code Status: DO NOT RESUSCITATE DO NOT INTUBATE Family Communication: With the patient's wife and daughter at bedside regarding the plan for admission and further work-up and treatment in the hospital Consults called: None   Flora Lipps MD [facility] Pager [phone]  If 7PM-7AM, please contact night-coverage [email or web address] Password [email or web address]  11/08/2017, 8:06 PM

## 2017-11-08 NOTE — ED Notes (Signed)
Admitting at bedside 

## 2017-11-08 NOTE — ED Provider Notes (Signed)
[facility] EMERGENCY DEPARTMENT Provider Note   CSN: [id number] Arrival date & time: 11/08/17  1511     History   Chief Complaint Chief Complaint  Patient presents with  . Bradycardia    HPI Richard Rosales is a 84 y.o. male.  HPI  Patient is an 84 year old male with past medical history of A. fib not currently on any anticoagulation, COPD, CHF, hypertension, hypothyroidism who comes in today after being sent from his facility for weakness and confusion.  Of note patient was recently admitted for lower extremity weakness and was found to have cervical spine stenosis with and trialed on Decadron.  Patient was discharged to a SNF and previously lived at home with his wife.  Per EMS, patient has been bradycardic throughout transport with possible few beat runs of V. tach.  Patient currently denies any complaint.  Patient denies chest pain shortness of breath nausea vomiting fevers chills.  Patient states that he is constipated and has not had a bowel movement in approximately 5 days, however patient had a bowel movement shortly after arriving.  Past Medical History:  Diagnosis Date  . Acute bronchitis   . Adenomatous colon polyp   . Allergic rhinitis   . Anemia    iron deficient  . Atrial fibrillation ([place])   . Atrial flutter ([place])   . Chronic gastritis   . COPD (chronic obstructive pulmonary disease) ([place])   . Diastolic dysfunction   . Diverticulosis of colon    recurrent GI bleeding  . DJD (degenerative joint disease)   . GERD (gastroesophageal reflux disease)   . GI bleed   . HTN (hypertension)   . Hyperplastic colon polyp 2003  . Hypothyroidism   . Moderate aortic insufficiency   . OSA (obstructive sleep apnea)    compliants w/ CPAP  . Positive PPD    remote  . Shortness of breath   . Venous insufficiency     Patient Active Problem List   Diagnosis Date Noted  . A-fib ([place]) 11/08/2017  . Weakness 11/08/2017  . Hypokalemia 10/27/2017  .  Spinal stenosis of lumbar region at multiple levels 10/26/2017  . Non-healing wound of lower extremity 10/22/2017  . Venous ulcers of both lower extremities ([place]) 08/12/2017  . Physical deconditioning 02/11/2017  . Essential tremor 12/04/2016  . MGUS (monoclonal gammopathy of unknown significance) 11/11/2016  . 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 Polyneuropathy 10/01/2016  . Gait disturbance 07/31/2016  . Aortic atherosclerosis ([place]) 06/02/2016  . Encounter for Medicare annual wellness exam 02/19/2015  . Hypertensive cardiovascular disease 03/06/2014  . Mixed hyperlipidemia 12/15/2013  . Prediabetes 12/15/2013  . Vitamin D deficiency 12/15/2013  . Medication management 12/15/2013  . DJD (degenerative joint disease)   . Allergic rhinitis   . Hypothyroidism   . Obesity, morbid ([place]) 06/13/2013  . COPD (chronic obstructive pulmonary disease) ([place]) 11/01/2009  . OSA and COPD overlap syndrome ([place]) 10/04/2009  . Chronic diastolic heart failure ([place]) 10/04/2009  . Diverticulosis of colon with hemorrhage 09/10/2009  . History of colonic polyps 09/10/2009  . Atrial fibrillation ([place]) 09/11/2008  . Essential hypertension 09/08/2008  . GERD (gastroesophageal reflux disease) 09/08/2008  . BPH with obstruction/lower urinary tract symptoms 09/08/2008    Past Surgical History:  Procedure Laterality Date  . CARDIAC CATHETERIZATION  7.27.05   revealed a preserved EF with no CAD  . COLONOSCOPY    . HERNIA REPAIR Bilateral   . JOINT REPLACEMENT  2001   lt total  knee-rt one done 00  . NASAL SINUS SURGERY    . POLYPECTOMY     adenomatous polyps 2010  . SHOULDER ARTHROSCOPY    . TOE SURGERY Right   . TONSILLECTOMY    . TOTAL KNEE ARTHROPLASTY     bilateral        Home Medications    Prior to Admission medications   Medication Sig Start Date End Date Taking? Authorizing Provider  albuterol (PROVENTIL HFA;VENTOLIN HFA) 108 (90 Base) MCG/ACT inhaler Inhale 2 puffs into  the lungs every 6 (six) hours as needed for wheezing or shortness of breath. 08/21/15  Yes Collene Gobble, MD  albuterol (PROVENTIL) (2.5 MG/3ML) 0.083% nebulizer solution Take 3 mLs (2.5 mg total) by nebulization every 4 (four) hours as needed for wheezing or shortness of breath. Patient taking differently: Take 2.5 mg by nebulization See admin instructions. Inhale one vial (2.5 mg) via nebulization every 4 hours while awake 10/31/17  Yes Hosie Poisson, MD  aspirin EC 81 MG tablet Take 81 mg by mouth every Monday, Wednesday, and Friday.   Yes [provider]  azelastine (ASTELIN) 0.1 % nasal spray USE 1 SPRAY IN EACH NOSTRIL TWICE A DAY 07/29/17  Yes Parrett, Tammy S, NP  b complex vitamins capsule Take 1 capsule by mouth daily.   Yes [provider]  bisacodyl (DULCOLAX) 10 MG suppository Place 10 mg rectally once as needed (if no BM in 8 hours after MOM administered).   Yes [provider]  calcium carbonate (TUMS - DOSED IN MG ELEMENTAL CALCIUM) 500 MG chewable tablet Chew 2 tablets by mouth 3 (three) times daily as needed for indigestion.   Yes [provider]  carvedilol (COREG) 6.25 MG tablet Take 1 tablet (6.25 mg total) by mouth 2 (two) times daily with a meal. 10/31/17  Yes Hosie Poisson, MD  Cholecalciferol (VITAMIN D) 2000 units tablet Take 4,000 Units by mouth daily.   Yes [provider]  docusate sodium (COLACE) 100 MG capsule Take 100 mg by mouth 2 (two) times daily.     Yes [provider]  doxycycline (VIBRA-TABS) 100 MG tablet Take 100 mg by mouth 2 (two) times daily. 10 day course started 11/06/17 pm   Yes [provider]  fish oil-omega-3 fatty acids 1000 MG capsule Take 1 g by mouth 2 (two) times daily.     Yes [provider]  furosemide (LASIX) 20 MG tablet Take 3 tablets (60 mg total) by mouth 2 (two) times daily. Patient taking differently: Take 60 mg by mouth daily.  10/31/17  Yes Hosie Poisson, MD    GLUCOSAMINE-CHONDROITIN PO Take 1 capsule by mouth daily.   Yes [provider]  HYDROcodone-acetaminophen (NORCO/VICODIN) 5-325 MG tablet Take 1-2 tablets by mouth every 4 (four) hours as needed for severe pain.   Yes [provider]  levothyroxine (SYNTHROID, LEVOTHROID) 100 MCG tablet TAKE 1 TABLET DAILY BEFORE BREAKFAST 06/17/17  Yes Liane Comber, NP  Magnesium Hydroxide (MILK OF MAGNESIA PO) Take 30 mLs by mouth once as needed (if no BM in 3 days).   Yes [provider]  metolazone (ZAROXOLYN) 5 MG tablet TAKE 1 TABLET DAILY AS DIRECTED FOR FLUID AND SWELLING Patient taking differently: TAKE ONE TABLET (5 MG) BY MOUTH DAILY FOR CHF 04/25/17  Yes Unk Pinto, MD  Multiple Vitamin (MULTIVITAMIN WITH MINERALS) TABS tablet Take 1 tablet by mouth daily.   Yes [provider]  omeprazole (PRILOSEC) 40 MG capsule TAKE 1 CAPSULE  TWICE A DAY Patient taking differently: TAKE 1 CAPSULE (40 MG) BY MOUTH TWICE DAILY WITH A MEAL 06/17/17  Yes Liane Comber, NP  OXYGEN Inhale 2 L into the lungs continuous.   Yes [provider]  potassium chloride (K-DUR) 10 MEQ tablet Take 1 tablet (10 mEq total) by mouth daily. Patient taking differently: Take 10-20 mEq by mouth See admin instructions. Order date 11/07/17: take 2 tablets (10 mg) by mouth twice daily for 3 days, then resume 1 tablet (10 mg) daily 04/09/17  Yes Parrett, Tammy S, NP  PRESCRIPTION MEDICATION Inhale into the lungs at bedtime. CPAP   Yes [provider]  Sodium Phosphates (FLEET ENEMA RE) Place 1 each rectally once as needed (if no BM within 2 hours after dulcolax suppository).   Yes [provider]  SPIRIVA HANDIHALER 18 MCG inhalation capsule INHALE THE CONTENTS OF 1 CAPSULE DAILY 08/18/17  Yes Collene Gobble, MD  SYMBICORT 160-4.5 MCG/ACT inhaler USE 2 INHALATIONS TWICE A DAY 10/26/17  Yes Byrum, Rose Fillers, MD  ammonium lactate (LAC-HYDRIN) 12 % lotion Apply topically every  morning. Patient not taking: Reported on 11/08/2017 10/31/17   Hosie Poisson, MD    Family History Family History  Problem Relation Age of Onset  . Hypertension Mother   . Diabetes Father   . Heart failure Father   . Arrhythmia Sister 68  . Lung cancer Brother        chemical    Social History Social History   Tobacco Use  . Smoking status: Former Smoker    Packs/day: 1.00    Years: 20.00    Pack years: 20.00    Types: Cigarettes    Last attempt to quit: 08/01/1978    Years since quitting: 39.3  . Smokeless tobacco: Never Used  Substance Use Topics  . Alcohol use: Yes    Alcohol/week: 0.0 oz    Comment: small glass of wine daily  . Drug use: No     Allergies   Ace inhibitors; Beta adrenergic blockers; Decadron [dexamethasone]; Fluticasone-salmeterol; Hytrin [terazosin]; Prednisone; and Vioxx [rofecoxib]   Review of Systems Review of Systems  Constitutional: Negative for chills and fever.  Respiratory: Negative for chest tightness and shortness of breath.   Cardiovascular: Negative for chest pain.  Gastrointestinal: Positive for abdominal distention and constipation.  Neurological: Positive for weakness. Negative for numbness.  Psychiatric/Behavioral: Positive for confusion.  All other systems reviewed and are negative.    Physical Exam Updated Vital Signs BP (!) 88/58 (BP Location: Right Arm)   Pulse (!) 56   Temp 97.8 F (36.6 C) (Axillary)   Resp 14   Ht 5\' 9"  (1.753 m)   Wt 101.9 kg (224 lb 10.4 oz)   SpO2 98%   BMI 33.17 kg/m   Physical Exam  Constitutional: He is oriented to person, place, and time. He appears well-developed and well-nourished.  HENT:  Head: Normocephalic and atraumatic.  Eyes: Pupils are equal, round, and reactive to light. Conjunctivae and EOM are normal.  Neck: Neck supple.  Cardiovascular: Normal rate and regular rhythm.  No murmur heard. Pulmonary/Chest: Effort normal and breath sounds normal. No respiratory distress.    Abdominal: Soft. He exhibits distension. There is no tenderness.  Musculoskeletal: He exhibits no edema.  Neurological: He is alert and oriented to person, place, and time. No cranial nerve deficit or sensory deficit. He exhibits normal muscle tone. Coordination normal.  Skin: Skin is warm and dry.  Psychiatric: He has a normal mood  and affect.  Nursing note and vitals reviewed.    ED Treatments / Results  Labs (all labs ordered are listed, but only abnormal results are displayed) Labs Reviewed  CBC WITH DIFFERENTIAL/PLATELET - Abnormal; Notable for the following components:      Result Value   WBC 18.4 (*)    Neutro Abs 15.2 (*)    Monocytes Absolute 1.6 (*)    Abs Immature Granulocytes 0.2 (*)    All other components within normal limits  BASIC METABOLIC PANEL - Abnormal; Notable for the following components:   Sodium 134 (*)    Potassium 2.9 (*)    Chloride 84 (*)    CO2 38 (*)    Glucose, Bld 122 (*)    BUN 29 (*)    Calcium 8.6 (*)    All other components within normal limits  URINALYSIS, ROUTINE W REFLEX MICROSCOPIC - Abnormal; Notable for the following components:   APPearance HAZY (*)    Hgb urine dipstick SMALL (*)    Leukocytes, UA MODERATE (*)    Bacteria, UA MANY (*)    All other components within normal limits  I-STAT TROPONIN, ED - Abnormal; Notable for the following components:   Troponin i, poc 0.24 (*)    All other components within normal limits  I-STAT CG4 LACTIC ACID, ED - Abnormal; Notable for the following components:   Lactic Acid, Venous 2.53 (*)    All other components within normal limits  I-STAT ARTERIAL BLOOD GAS, ED - Abnormal; Notable for the following components:   pH, Arterial 7.552 (*)    Bicarbonate 40.6 (*)    TCO2 42 (*)    Acid-Base Excess 16.0 (*)    All other components within normal limits  MRSA PCR SCREENING  URINE CULTURE  TSH  T4, FREE  BLOOD GAS, ARTERIAL  CREATININE, SERUM  BASIC METABOLIC PANEL  CBC  MAGNESIUM   PHOSPHORUS  LACTIC ACID, PLASMA  I-STAT CG4 LACTIC ACID, ED    EKG EKG Interpretation  Date/Time:  Sunday Nov 08 2017 15:19:08 EDT Ventricular Rate:  64 PR Interval:    QRS Duration: 239 QT Interval:  522 QTC Calculation: 539 R Axis:   -86 Text Interpretation:  Atrial fibrillation Right bundle branch block Confirmed by Elnora Morrison [phone]) on 11/08/2017 3:29:13 PM   Radiology Dg Chest Portable 1 View  Result Date: 11/08/2017 CLINICAL DATA:  Patient with worsening lethargy. EXAM: PORTABLE CHEST 1 VIEW COMPARISON:  Chest radiograph 10/28/2017. FINDINGS: Limited rotated exam. Monitoring leads overlie the patient. Stable cardiomegaly. Similar right paratracheal density. Thoracic aortic vascular calcifications. Low lung volumes. Bibasilar heterogeneous opacities. Mild interstitial opacities bilaterally. No definite pleural effusion or pneumothorax. IMPRESSION: Cardiomegaly. Interstitial opacities bilaterally may represent mild edema. Basilar atelectasis. Electronically Signed   By: Lovey Newcomer [name]   On: 11/08/2017 16:22    Procedures Procedures (including critical care time)  Medications Ordered in ED Medications  docusate sodium (COLACE) capsule 100 mg (0 mg Oral Duplicate 0/98/11 9147)  fish oil-omega-3 fatty acids capsule 1 g (1 g Oral Given 11/08/17 2221)  pantoprazole (PROTONIX) EC tablet 40 mg (40 mg Oral Not Given 11/08/17 2223)  azelastine (ASTELIN) 0.1 % nasal spray 1 spray (has no administration in time range)  tiotropium (SPIRIVA) inhalation capsule 18 mcg (18 mcg Inhalation Not Given 11/08/17 2115)  mometasone-formoterol (DULERA) 200-5 MCG/ACT inhaler 2 puff (2 puffs Inhalation Not Given 11/08/17 2115)  carvedilol (COREG) tablet 6.25 mg (has no administration in time range)  aspirin EC tablet  81 mg (has no administration in time range)  b complex vitamins capsule 1 capsule (1 capsule Oral Not Given 11/08/17 2224)  multivitamin with minerals tablet 1 tablet (1 tablet Oral  Not Given 11/08/17 2223)  cholecalciferol (VITAMIN D) tablet 4,000 Units (4,000 Units Oral Not Given 11/08/17 2223)  HYDROcodone-acetaminophen (NORCO/VICODIN) 5-325 MG per tablet 1-2 tablet (has no administration in time range)  calcium carbonate (TUMS - dosed in mg elemental calcium) chewable tablet 400 mg of elemental calcium (has no administration in time range)  bisacodyl (DULCOLAX) suppository 10 mg (has no administration in time range)  ipratropium-albuterol (DUONEB) 0.5-2.5 (3) MG/3ML nebulizer solution 3 mL (3 mLs Nebulization Not Given 11/08/17 2321)  heparin injection 5,000 Units (5,000 Units Subcutaneous Given 11/08/17 2220)  0.9 % NaCl with KCl 40 mEq / L  infusion (100 mL/hr Intravenous New Bag/Given 11/08/17 2304)  acetaminophen (TYLENOL) tablet 650 mg (has no administration in time range)    Or  acetaminophen (TYLENOL) suppository 650 mg (has no administration in time range)  docusate sodium (COLACE) capsule 100 mg (100 mg Oral Given 11/08/17 2220)  bisacodyl (DULCOLAX) suppository 10 mg (has no administration in time range)  cefTRIAXone (ROCEPHIN) 1 g in sodium chloride 0.9 % 100 mL IVPB (1 g Intravenous New Bag/Given 11/08/17 2304)  lactated ringers bolus 1,000 mL (0 mLs Intravenous Stopped 11/08/17 1736)  acetaminophen (TYLENOL) tablet 1,000 mg (1,000 mg Oral Given 11/08/17 1730)  potassium chloride 10 mEq in 100 mL IVPB (0 mEq Intravenous Stopped 11/08/17 1903)  potassium chloride SA (K-DUR,KLOR-CON) CR tablet 40 mEq (40 mEq Oral Given 11/08/17 1729)  calcium carbonate (TUMS - dosed in mg elemental calcium) chewable tablet 200 mg of elemental calcium (200 mg of elemental calcium Oral Given 11/08/17 1818)  cefTRIAXone (ROCEPHIN) 1 g in sodium chloride 0.9 % 100 mL IVPB (0 g Intravenous Stopped 11/08/17 2058)     Initial Impression / Assessment and Plan / ED Course  I have reviewed the triage vital signs and the nursing notes.  Pertinent labs & imaging results that were available during  my care of the patient were reviewed by me and considered in my medical decision making (see chart for details).     Physical exam within normal limits with exception of irregular irregular rhythm and bradycardia which ranges from mid 30s to 80s.  Patient mildly hypotensive but afebrile.   Patient found to be hypokalemic.  Gave 1 round of IV as well as 1 dose of p.o.  Patient with positive troponin.  Lactic acid cleared following 500 cc fluid ministration.  Will admit patient to the hospitalist for further evaluation and care.  Final Clinical Impressions(s) / ED Diagnoses   Final diagnoses:  None    ED Discharge Orders    None       Chapman Moss, MD 11/09/17 8502    Elnora Morrison, MD 11/09/17 Alena Bills    Elnora Morrison, MD 11/17/17 [phone]

## 2017-11-08 NOTE — ED Triage Notes (Signed)
Pt BIB [facility] from [facility] for increased lethargy over "a few days". Initial HR 38, hx afib, rate between 30-50. Denies pain. A&O x 4 at this time. Given 517ml NS PTA.

## 2017-11-09 ENCOUNTER — Telehealth: Payer: Self-pay | Admitting: Internal Medicine

## 2017-11-09 ENCOUNTER — Inpatient Hospital Stay (HOSPITAL_COMMUNITY): Payer: Medicare Other

## 2017-11-09 DIAGNOSIS — R531 Weakness: Secondary | ICD-10-CM

## 2017-11-09 DIAGNOSIS — E039 Hypothyroidism, unspecified: Secondary | ICD-10-CM

## 2017-11-09 DIAGNOSIS — N390 Urinary tract infection, site not specified: Secondary | ICD-10-CM

## 2017-11-09 DIAGNOSIS — E876 Hypokalemia: Secondary | ICD-10-CM

## 2017-11-09 DIAGNOSIS — I10 Essential (primary) hypertension: Secondary | ICD-10-CM

## 2017-11-09 DIAGNOSIS — I5032 Chronic diastolic (congestive) heart failure: Secondary | ICD-10-CM

## 2017-11-09 DIAGNOSIS — I4891 Unspecified atrial fibrillation: Secondary | ICD-10-CM

## 2017-11-09 DIAGNOSIS — J449 Chronic obstructive pulmonary disease, unspecified: Secondary | ICD-10-CM

## 2017-11-09 LAB — CBC
HEMATOCRIT: 43.2 % (ref 39.0–52.0)
Hemoglobin: 14.3 g/dL (ref 13.0–17.0)
MCH: 30.6 pg (ref 26.0–34.0)
MCHC: 33.1 g/dL (ref 30.0–36.0)
MCV: 92.3 fL (ref 78.0–100.0)
Platelets: 203 10*3/uL (ref 150–400)
RBC: 4.68 MIL/uL (ref 4.22–5.81)
RDW: 12.9 % (ref 11.5–15.5)
WBC: 14 10*3/uL — ABNORMAL HIGH (ref 4.0–10.5)

## 2017-11-09 LAB — MAGNESIUM: Magnesium: 2 mg/dL (ref 1.7–2.4)

## 2017-11-09 LAB — BASIC METABOLIC PANEL
Anion gap: 7 (ref 5–15)
Anion gap: 13 (ref 5–15)
BUN: 26 mg/dL — AB (ref 6–20)
BUN: 27 mg/dL — ABNORMAL HIGH (ref 6–20)
CALCIUM: 8.6 mg/dL — AB (ref 8.9–10.3)
CHLORIDE: 94 mmol/L — AB (ref 101–111)
CO2: 35 mmol/L — ABNORMAL HIGH (ref 22–32)
CO2: 33 mmol/L — AB (ref 22–32)
CREATININE: 0.77 mg/dL (ref 0.61–1.24)
CREATININE: 0.77 mg/dL (ref 0.61–1.24)
Calcium: 7.6 mg/dL — ABNORMAL LOW (ref 8.9–10.3)
Chloride: 89 mmol/L — ABNORMAL LOW (ref 101–111)
GFR calc Af Amer: >60 mL/min (ref >60)
GFR calc Af Amer: >60 mL/min (ref >60)
GFR calc non Af Amer: >60 mL/min (ref >60)
GLUCOSE: 88 mg/dL (ref 65–99)
GLUCOSE: 81 mg/dL (ref 65–99)
POTASSIUM: 7.3 mmol/L — AB (ref 3.5–5.1)
Potassium: 3.9 mmol/L (ref 3.5–5.1)
Sodium: 136 mmol/L (ref 135–145)
Sodium: 135 mmol/L (ref 135–145)

## 2017-11-09 LAB — LACTIC ACID, PLASMA: Lactic Acid, Venous: 0.9 mmol/L (ref 0.5–1.9)

## 2017-11-09 LAB — PHOSPHORUS: Phosphorus: 2.4 mg/dL — ABNORMAL LOW (ref 2.5–4.6)

## 2017-11-09 MED ORDER — OMEGA-3-ACID ETHYL ESTERS 1 G PO CAPS
1.0000 g | ORAL_CAPSULE | Freq: Two times a day (BID) | ORAL | Status: DC
  Administered 2017-11-09 – 2017-11-12 (×7): 1 g via ORAL
  Filled 2017-11-09 (×7): qty 1

## 2017-11-09 MED ORDER — B COMPLEX-C PO TABS
1.0000 | ORAL_TABLET | Freq: Every day | ORAL | Status: DC
  Administered 2017-11-09 – 2017-11-12 (×4): 1 via ORAL
  Filled 2017-11-09 (×4): qty 1

## 2017-11-09 MED ORDER — ALBUTEROL SULFATE (2.5 MG/3ML) 0.083% IN NEBU: 2.5000 mg | INHALATION_SOLUTION | Freq: Four times a day (QID) | RESPIRATORY_TRACT | Status: DC | PRN

## 2017-11-09 MED ORDER — IPRATROPIUM-ALBUTEROL 0.5-2.5 (3) MG/3ML IN SOLN
3.0000 mL | Freq: Three times a day (TID) | RESPIRATORY_TRACT | Status: DC
  Administered 2017-11-09: 3 mL via RESPIRATORY_TRACT
  Filled 2017-11-09: qty 3

## 2017-11-09 MED ORDER — IPRATROPIUM-ALBUTEROL 0.5-2.5 (3) MG/3ML IN SOLN: 3.0000 mL | Freq: Four times a day (QID) | RESPIRATORY_TRACT | Status: DC | PRN

## 2017-11-09 NOTE — Evaluation (Signed)
Physical Therapy Evaluation Patient Details Name: Richard Rosales MRN: [id number] DOB: 1931-05-20 Today's Date: 11/09/2017   History of Present Illness  84 y.o. male presented to the hospital with increasing weakness and difficulty ambulation. PHM including medical history significant of hypertension, atrial fibrillation not on anticoagulation, COPD on 2 L of nasal cannula, congestive heart failure, hypothyroidism, history of MGUS, sleep apnea on CPAP and venous stasis. Pt with recent admission for BLEs weakness and had cervical spinal stenosis. Discharged to SNF for rehabilitation.   Clinical Impression  Pt very weak but did follow commands and initiate all task and transfers. Pt currently requires maxAx2 for transfers and is unable to walk at this time. Acute PT to con't to follow.    Follow Up Recommendations SNF    Equipment Recommendations  None recommended by PT    Recommendations for Other Services       Precautions / Restrictions Precautions Precautions: Fall Precaution Comments: O2 dep Restrictions Weight Bearing Restrictions: No      Mobility  Bed Mobility Overal bed mobility: Needs Assistance Bed Mobility: Supine to Sit     Supine to sit: Max assist;+2 for physical assistance     General bed mobility comments: with v/c's pt able ot bring LEs off EOB, pt reports due to torn rotator cuff and arthritis he is unable to pull with UEs, PT/OT provided maxAx2 to elevate trunk and bring to EOB  Transfers Overall transfer level: Needs assistance Equipment used: (2 person lift with gait belt and bed pad) Transfers: Sit to/from Stand;Lateral/Scoot Transfers Sit to Stand: Max assist;+2 physical assistance;From elevated surface        Lateral/Scoot Transfers: Max assist;+2 physical assistance General transfer comment: pt requires max v/c's to complete task, pt initiated all movement but was unable to achieve full upright posture and was unable to physical step with L or R  to complete std pvt to chair.  Ambulation/Gait             General Gait Details: unable at this time  Stairs            Wheelchair Mobility    Modified Rankin (Stroke Patients Only)       Balance Overall balance assessment: Needs assistance Sitting-balance support: No upper extremity supported;Feet supported Sitting balance-Leahy Scale: Fair Sitting balance - Comments: Able to maintain sitting at EOB. fatigues Postural control: Other (comment)(forward flexion of shoulder and neck) Standing balance support: Bilateral upper extremity supported;During functional activity Standing balance-Leahy Scale: Poor Standing balance comment: unable to stand without physical assist                             Pertinent Vitals/Pain Pain Assessment: Faces Faces Pain Scale: Hurts little more Pain Location: back(pt states all over) Pain Descriptors / Indicators: Aching;Sore;Discomfort Pain Intervention(s): Monitored during session    Home Living Family/patient expects to be discharged to:: Skilled nursing facility Living Arrangements: Spouse/significant other Available Help at Discharge: Family             Additional Comments: pt reports he was at [facility]     Prior Function Level of [place]: Needs assistance   Gait / Transfers Assistance Needed: Pt reports that he was walking short distance at SNF with assistance  ADL's / Homemaking Assistance Needed: Reports assistance for BADLs at SNF  Comments: No family present to confirm information     Hand Dominance   Dominant Hand: Right    Extremity/Trunk Assessment  Upper Extremity Assessment Upper Extremity Assessment: Defer to OT evaluation RUE Deficits / Details: Limited shoulder ROM 0-45degrees. Decreased pinch and grasp strength with slight tremors. Able to bring hand to face during grooming tasks. RUE Coordination: decreased gross motor LUE Deficits / Details: Limited shoulder ROM 0-45degrees.  Decreased pinch and grasp strength with slight tremors. Able to bring hand to face during grooming tasks. LUE Coordination: decreased gross motor    Lower Extremity Assessment Lower Extremity Assessment: Generalized weakness(grossly 3-/5)    Cervical / Trunk Assessment Cervical / Trunk Assessment: Kyphotic  Communication   Communication: (mild slurred speech, L facial droop)  Cognition Arousal/Alertness: Awake/alert Behavior During Therapy: WFL for tasks assessed/performed Overall Cognitive Status: Impaired/Different from baseline Area of Impairment: Memory;Following commands;Problem solving;Orientation;Awareness                 Orientation Level: Disoriented to;Time;Place   Memory: Decreased short-term memory Following Commands: Follows one step commands with increased time   Awareness: Emergent Problem Solving: Slow processing;Requires verbal cues;Requires tactile cues General Comments: Pt stated he was in [facility] initially. Pt likes ot joke alot and avoid questions      General Comments General comments (skin integrity, edema, etc.): BP supine in bed 105/43; sitting EOB 84/54; sitting in recliner 101/71. BP taken at left arm    Exercises     Assessment/Plan    PT Assessment Patient needs continued PT services  PT Problem List Decreased strength;Decreased balance;Decreased mobility;Decreased activity tolerance;Decreased knowledge of use of DME       PT Treatment Interventions DME instruction;Gait training;Functional mobility training;Therapeutic activities;Balance training;Patient/family education;Therapeutic exercise    PT Goals (Current goals can be found in the Care Plan section)  Acute Rehab PT Goals Patient Stated Goal: Get stronger PT Goal Formulation: With patient/family Time For Goal Achievement: 11/24/17 Potential to Achieve Goals: Good    Frequency Min 3X/week   Barriers to discharge        Co-evaluation PT/OT/SLP  Co-Evaluation/Treatment: Yes Reason for Co-Treatment: Complexity of the patient's impairments (multi-system involvement) PT goals addressed during session: Mobility/safety with mobility OT goals addressed during session: ADL's and self-care       AM-PAC PT "6 Clicks" Daily Activity  Outcome Measure Difficulty turning over in bed (including adjusting bedclothes, sheets and blankets)?: Unable Difficulty moving from lying on back to sitting on the side of the bed? : Unable Difficulty sitting down on and standing up from a chair with arms (e.g., wheelchair, bedside commode, etc,.)?: Unable Help needed moving to and from a bed to chair (including a wheelchair)?: A Lot Help needed walking in hospital room?: A Lot Help needed climbing 3-5 steps with a railing? : Total 6 Click Score: 8    End of Session Equipment Utilized During Treatment: Gait belt;Oxygen Activity Tolerance: Patient tolerated treatment well Patient left: in chair;with call bell/phone within reach;with family/visitor present   PT Visit Diagnosis: Muscle weakness (generalized) (M62.81);Difficulty in walking, not elsewhere classified (R26.2);Unsteadiness on feet (R26.81);Other abnormalities of gait and mobility (R26.89)    Time: 5176-1607 PT Time Calculation (min) (ACUTE ONLY): 34 min   Charges:   PT Evaluation $PT Eval Moderate Complexity: 1 Mod     PT G Codes:        Kittie Plater, PT, DPT Pager #: [phone] Office #: [phone]   Darneshia Demary M Eveleigh Crumpler 11/09/2017, 10:22 AM

## 2017-11-09 NOTE — Progress Notes (Addendum)
PROGRESS NOTE    Richard Rosales  WUX:[id number] DOB: 12-28-1930 DOA: 11/08/2017 PCP: Unk Pinto, MD    Brief Narrative:  84 year old male presented with a chief complaint of weakness and confusion.  He does have the significant past medical history for hypertension, chronic atrial fibrillation, COPD with chronic hypoxic respirator failure, preserved systolic function heart failure, hypothyroidism, MGUS and sleep apnea.  Patient was found very weak and deconditioned, EMS was called, he was found to have bradycardia with a few runs of ventricular tachycardia.  Patient had significant decreased oral intake.  He is a nursing home resident, admitted to the hospital for bilateral extremity weakness and had cervical spinal stenosis, started on Decadron.  On his initial physical examination blood pressure 124/57, heart rate 48, respiratory 22, oxygen saturation 95%.  Dry mucous membranes, his lungs are clear to auscultation bilaterally, heart S1-S2 present, rhythmic, abdomen soft nontender, positive pitting bilateral lower extremity edema.UA with 21-50 wbc.   Patient was admitted to the hospital with a working diagnosis of generalized weakness due to significant hypokalemia and dehydration.  Assessment & Plan:   Principal Problem:   Weakness Active Problems:   OSA and COPD overlap syndrome (HCC)   Essential hypertension   Atrial fibrillation (HCC)   Chronic diastolic heart failure (HCC)   COPD (chronic obstructive pulmonary disease) (HCC)   Hypothyroidism   Venous ulcers of both lower extremities (HCC)   Hypokalemia   A-fib ([place])   1.  Generalized weakness due to dehydration hypokalemia. Improved volume status, continue gentle hydration with saline, serum K improved. Follow physical therapy evaluation.   2.  Urinary tract infection. Continue antibiotic therapy Iv ceftroaxone, will continue to follow cell count, cultures and temperature curve.   3.  Heart failure with preserved systolic  function. No sing of decompensation, will continue gentle hydration. Will continue with carvedilol.   4.  COPD. No signs of exacerbation, continue oxymetry monitoring. Continue albuterol and tiotropium. Continue dulera.    DVT prophylaxis: heparin  Code Status: dnr Family Communication: no family at the bedside Disposition Plan: snf in 24 to 48 hours   Consultants:     Procedures:     Antimicrobials:   IV ceftriaxone.     Subjective: Patient is feeling better, but persistent weakness, no dyspnea or chest pain, no nausea or vomiting, this am with no confusion or agitation.   Objective: Vitals:   11/09/17 0300 11/09/17 0613 11/09/17 0700 11/09/17 0943  BP: 100/81 (!) 86/59 (!) 83/54   Pulse: 71  70   Resp: 16 (!) 26 17   Temp: (!) 97.5 F (36.4 C)  98.5 F (36.9 C)   TempSrc: Axillary  Oral   SpO2: 96%  100% 97%  Weight: 104.2 kg (229 lb 11.5 oz)     Height:        Intake/Output Summary (Last 24 hours) at 11/09/2017 1059 Last data filed at 11/09/2017 0500 Gross per 24 hour  Intake 1633.33 ml  Output 756 ml  Net 877.33 ml   Filed Weights   11/08/17 2146 11/09/17 0300  Weight: 101.9 kg (224 lb 10.4 oz) 104.2 kg (229 lb 11.5 oz)    Examination:   General: Not in pain or dyspnea, deconditined Neurology: Awake and alert, non focal  E ENT: mild pallor, no icterus, oral mucosa moist Cardiovascular: No JVD. S1-S2 present, rhythmic, no gallops, rubs, or murmurs. Non pitting edema +++ at the lower extremity. Pulmonary: decreased breath sounds bilaterally, adequate air movement, no wheezing, rhonchi or  rales. Gastrointestinal. Abdomen with no organomegaly, non tender, no rebound or guarding Skin. No rashes Musculoskeletal: no joint deformities     Data Reviewed: I have personally reviewed following labs and imaging studies  CBC: Recent Labs  Lab 11/08/17 1531 11/09/17 0333  WBC 18.4* 14.0*  NEUTROABS 15.2*  --   HGB 15.1 14.3  HCT 45.1 43.2  MCV 89.7  92.3  PLT 227 867   Basic Metabolic Panel: Recent Labs  Lab 11/08/17 1531 11/09/17 0333 11/09/17 0536  NA 134* 136 135  K 2.9* 7.3* 3.9  CL 84* 94* 89*  CO2 38* 35* 33*  GLUCOSE 122* 88 81  BUN 29* 26* 27*  CREATININE 0.88 0.77 0.77  CALCIUM 8.6* 7.6* 8.6*  MG  --   --  2.0  PHOS  --   --  2.4*   GFR: Estimated Creatinine Clearance: 81.8 mL/min (by C-G formula based on SCr of 0.77 mg/dL). Liver Function Tests: No results for input(s): AST, ALT, ALKPHOS, BILITOT, PROT, ALBUMIN in the last 168 hours. No results for input(s): LIPASE, AMYLASE in the last 168 hours. No results for input(s): AMMONIA in the last 168 hours. Coagulation Profile: No results for input(s): INR, PROTIME in the last 168 hours. Cardiac Enzymes: No results for input(s): CKTOTAL, CKMB, CKMBINDEX, TROPONINI in the last 168 hours. BNP (last 3 results) Recent Labs    04/07/17 1632  PROBNP 230.0*   HbA1C: No results for input(s): HGBA1C in the last 72 hours. CBG: No results for input(s): GLUCAP in the last 168 hours. Lipid Profile: No results for input(s): CHOL, HDL, LDLCALC, TRIG, CHOLHDL, LDLDIRECT in the last 72 hours. Thyroid Function Tests: Recent Labs    11/08/17 1531  TSH 1.951  FREET4 1.76   Anemia Panel: No results for input(s): VITAMINB12, FOLATE, FERRITIN, TIBC, IRON, RETICCTPCT in the last 72 hours.    Radiology Studies: I have reviewed all of the imaging during this hospital visit personally     Scheduled Meds: . aspirin EC  81 mg Oral Q M,W,F  . azelastine  1 spray Each Nare BID  . B-complex with vitamin C  1 tablet Oral Daily  . carvedilol  6.25 mg Oral BID WC  . cholecalciferol  4,000 Units Oral Daily  . docusate sodium  100 mg Oral BID  . docusate sodium  100 mg Oral BID  . heparin  5,000 Units Subcutaneous Q8H  . ipratropium-albuterol  3 mL Nebulization TID  . mometasone-formoterol  2 puff Inhalation BID  . multivitamin with minerals  1 tablet Oral Daily  . omega-3  acid ethyl esters  1 g Oral BID  . pantoprazole  40 mg Oral Daily  . tiotropium  1 capsule Inhalation Daily   Continuous Infusions: . 0.9 % NaCl with KCl 40 mEq / L Stopped (11/09/17 0500)  . cefTRIAXone (ROCEPHIN)  IV Stopped (11/09/17 0118)     LOS: 1 day        Mauricio Gerome Apley, MD [facility] Pager [phone]

## 2017-11-09 NOTE — Progress Notes (Signed)
Chaplain's Note:  I have known patient for many years. I made a supportive visit with him while he was visiting with another friend. Pt. Is a retired [employer], married with three children. 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 He has good family and community support.   Sue Lush # [phone]

## 2017-11-09 NOTE — Evaluation (Signed)
Occupational Therapy Evaluation Patient Details Name: Richard Rosales MRN: [id number] DOB: [birth year] Today's Date: 11/09/2017    History of Present Illness 84 y.o. male presented to the hospital with increasing weakness and difficulty ambulation. PHM including medical history significant of hypertension, atrial fibrillation not on anticoagulation, COPD on 2 L of nasal cannula, congestive heart failure, hypothyroidism, history of MGUS, sleep apnea on CPAP and venous stasis. Pt with recent admission for BLEs weakness and had cervical spinal stenosis. Discharged to SNF for rehabilitation.    Clinical Impression   PTA, pt was at [facility] SNF for rehab after recent hospital admission for BLE weakness and cervical spinal stenosis. Pt currently requiring Min A for grooming while seated at EOB, Mod A for UB ADLs, Max A for LB ADLs, and Max A for functional mobility. Pt presenting with poor activity tolerance, balance, strength, and cognition. Pt would benefit from further acute OT to facilitate safe dc. Recommend dc to SNF to continue rehab services and for further OT to optimize safety, independence with ADLs, and return to PLOF.       SNF;Supervision/Assistance - 24 hour    Equipment Recommendations  Other (comment)(Defer to next venue)    Recommendations for Other Services PT consult     Precautions / Restrictions Precautions Precautions: Fall Restrictions Weight Bearing Restrictions: No      Mobility Bed Mobility Overal bed mobility: Needs Assistance Bed Mobility: Supine to Sit     Supine to sit: Max assist;+2 for physical assistance     General bed mobility comments: Pt able to bring BLEs towards EOB in preparation to sit EOB. Max A to elevate trunk and bring hips towards EOB with use of bed pad.   Transfers Overall transfer level: Needs assistance Equipment used: 2 person hand held assist Transfers: Sit to/from Stand;Lateral/Scoot Transfers Sit to Stand: Max assist;+2 physical  assistance;From elevated surface        Lateral/Scoot Transfers: Max assist;+2 physical assistance General transfer comment: Pt requiring Max A +2 to power up into standing with use of bed pad to elevate hips. Max A +2 for alteral scoot to right with bed pad to manage hips    Balance Overall balance assessment: Needs assistance Sitting-balance support: No upper extremity supported;Feet supported Sitting balance-Leahy Scale: Fair Sitting balance - Comments: Able to maintain sitting at EOB. fatigues Postural control: Other (comment)(forward flexion of shoulder and neck) Standing balance support: Bilateral upper extremity supported;During functional activity Standing balance-Leahy Scale: Poor Standing balance comment: Reliant on physical A                           ADL either performed or assessed with clinical judgement   ADL Overall ADL's : Needs assistance/impaired Eating/Feeding: Set up;Sitting Eating/Feeding Details (indicate cue type and reason): Pt demonstrating decreased pinch strength and slight tremors during self feeding and drinking.  Grooming: Minimal assistance;Sitting;Wash/dry face Grooming Details (indicate cue type and reason): Pt able to bring wash cloth to face while seated at EOB. Demonstrating decreased activity tolerance and fatigues. Pt with difficulty raising BUEs over head. Requiring Min A for reaching during combing hair Upper Body Bathing: Moderate assistance;Sitting   Lower Body Bathing: Maximal assistance;+2 for physical assistance;Sit to/from stand   Upper Body Dressing : Moderate assistance;Sitting   Lower Body Dressing: Maximal assistance;+2 for physical assistance;Sit to/from stand   Toilet Transfer: Maximal assistance;+2 for physical assistance(Simulated to recliner with lateral scoots) Toilet Transfer Details (indicate cue type and reason): Pt  performing lateral scoots to right with Max A +2 to a drop arm recliner. Use of bed pad to elevate  hips.  Toileting- Clothing Manipulation and Hygiene: Total assistance       Functional mobility during ADLs: Maximal assistance;+2 for physical assistance(lateral scoot) General ADL Comments: Pt presenting with decreased activity tolerance and fatigues quickly.     Vision Baseline Vision/History: Wears glasses Wears Glasses: Reading only Patient Visual Report: No change from baseline       Perception     Praxis      Pertinent Vitals/Pain Pain Assessment: Faces Faces Pain Scale: Hurts little more Pain Location: back Pain Descriptors / Indicators: Aching;Sore;Discomfort Pain Intervention(s): Monitored during session;Limited activity within patient's tolerance;Repositioned     Hand Dominance Right   Extremity/Trunk Assessment Upper Extremity Assessment Upper Extremity Assessment: RUE deficits/detail;LUE deficits/detail RUE Deficits / Details: Limited shoulder ROM 0-45degrees. Decreased pinch and grasp strength with slight tremors. Able to bring hand to face during grooming tasks. RUE Coordination: decreased gross motor LUE Deficits / Details: Limited shoulder ROM 0-45degrees. Decreased pinch and grasp strength with slight tremors. Able to bring hand to face during grooming tasks. LUE Coordination: decreased gross motor   Lower Extremity Assessment Lower Extremity Assessment: Defer to PT evaluation   Cervical / Trunk Assessment Cervical / Trunk Assessment: Kyphotic   Communication Communication Communication: No difficulties   Cognition Arousal/Alertness: Awake/alert Behavior During Therapy: WFL for tasks assessed/performed Overall Cognitive Status: Impaired/Different from baseline Area of Impairment: Memory;Following commands;Problem solving;Orientation;Awareness                 Orientation Level: Disoriented to;Time;Place   Memory: Decreased short-term memory Following Commands: Follows one step commands with increased time   Awareness: Emergent Problem  Solving: Slow processing;Requires verbal cues;Requires tactile cues General Comments: Pt disoriented to place stating "I am [name]." Pt using humor and sarcasm throughout session. At times, seems oriented to situation. Pt repeating questions during session such as "have you seen my wife" Requiring increased time during session to follow cues   General Comments  BP supine in bed 105/43; sitting EOB 84/54; sitting in recliner 101/71. BP taken at left arm    Exercises     Shoulder Instructions      Home Living Family/patient expects to be discharged to:: Skilled nursing facility Living Arrangements: Spouse/significant other Available Help at Discharge: Family                                    Prior Functioning/Environment Level of Independence: Needs assistance  Gait / Transfers Assistance Needed: Pt reports that he was walking short distance at SNF with assistance ADL's / Homemaking Assistance Needed: Reports assistance for BADLs at SNF   Comments: No family present to confirm information        OT Problem List: Decreased strength;Decreased range of motion;Decreased activity tolerance;Impaired balance (sitting and/or standing);Decreased cognition;Decreased safety awareness;Decreased knowledge of use of DME or AE;Decreased knowledge of precautions;Pain;Impaired UE functional use      OT Treatment/Interventions: Self-care/ADL training;Therapeutic exercise;Energy conservation;DME and/or AE instruction;Therapeutic activities;Patient/family education    OT Goals(Current goals can be found in the care plan section) Acute Rehab OT Goals Patient Stated Goal: Get stronger OT Goal Formulation: With patient Time For Goal Achievement: 11/23/17 Potential to Achieve Goals: Good ADL Goals Pt Will Perform Grooming: with set-up;with supervision;sitting Pt Will Perform Upper Body Dressing: sitting;with supervision;with set-up Pt Will Perform Lower Body  Dressing: with mod  assist;sit to/from stand;sitting/lateral leans Pt Will Transfer to Toilet: with mod assist;stand pivot transfer;bedside commode Pt Will Perform Toileting - Clothing Manipulation and hygiene: with mod assist;sit to/from stand;sitting/lateral leans Additional ADL Goal #1: Pt will perform bed mobility with Min A +2 in preparation for ADLs  OT Frequency: Min 2X/week   Barriers to D/C:            Co-evaluation PT/OT/SLP Co-Evaluation/Treatment: Yes Reason for Co-Treatment: For patient/therapist safety;To address functional/ADL transfers   OT goals addressed during session: ADL's and self-care      AM-PAC PT "6 Clicks" Daily Activity     Outcome Measure Help from another person eating meals?: A Little Help from another person taking care of personal grooming?: A Little Help from another person toileting, which includes using toliet, bedpan, or urinal?: A Lot Help from another person bathing (including washing, rinsing, drying)?: A Lot Help from another person to put on and taking off regular upper body clothing?: A Lot Help from another person to put on and taking off regular lower body clothing?: A Lot 6 Click Score: 14   End of Session Equipment Utilized During Treatment: Gait belt;Oxygen Nurse Communication: Mobility status(No chair alarm box in room)  Activity Tolerance: Patient tolerated treatment well;Patient limited by fatigue Patient left: in chair;with call bell/phone within reach;with chair alarm set  OT Visit Diagnosis: Unsteadiness on feet (R26.81);Other abnormalities of gait and mobility (R26.89);Muscle weakness (generalized) (M62.81);Pain Pain - Right/Left: (Bilateral) Pain - part of body: Leg;Ankle and joints of foot;Hip;Shoulder                Time: 5093-2671 OT Time Calculation (min): 35 min Charges:  OT General Charges $OT Visit: 1 Visit OT Evaluation $OT Eval Moderate Complexity: 1 Mod G-Codes:     Ravin Denardo MSOT, OTR/L Acute Rehab Pager:  [phone] Office: [place] 11/09/2017, 9:15 AM

## 2017-11-09 NOTE — Telephone Encounter (Signed)
New Message:       Pt's son is calling to see if allred can tell him about what is going on with his father due to his father being hospitalized on yesterday.

## 2017-11-09 NOTE — Progress Notes (Signed)
Md notified of potassium of 7.3. Redrawing lab to verify if correct.  Potassium on adm was 2.9.  Holding drip until lab value posted.  Will continue to monitor. Saunders Revel T

## 2017-11-10 DIAGNOSIS — I482 Chronic atrial fibrillation: Secondary | ICD-10-CM

## 2017-11-10 DIAGNOSIS — N3000 Acute cystitis without hematuria: Secondary | ICD-10-CM

## 2017-11-10 LAB — CBC WITH DIFFERENTIAL/PLATELET
Abs Immature Granulocytes: 0.1 10*3/uL (ref 0.0–0.1)
Basophils Absolute: 0 10*3/uL (ref 0.0–0.1)
Basophils Relative: 0 %
EOS PCT: 2 %
Eosinophils Absolute: 0.3 10*3/uL (ref 0.0–0.7)
HEMATOCRIT: 41.2 % (ref 39.0–52.0)
HEMOGLOBIN: 13.7 g/dL (ref 13.0–17.0)
Immature Granulocytes: 1 %
LYMPHS ABS: 1.8 10*3/uL (ref 0.7–4.0)
LYMPHS PCT: 14 %
MCH: 29.9 pg (ref 26.0–34.0)
MCHC: 33.3 g/dL (ref 30.0–36.0)
MCV: 90 fL (ref 78.0–100.0)
MONO ABS: 1.1 10*3/uL — AB (ref 0.1–1.0)
MONOS PCT: 8 %
Neutro Abs: 9.9 10*3/uL — ABNORMAL HIGH (ref 1.7–7.7)
Neutrophils Relative %: 75 %
Platelets: 204 10*3/uL (ref 150–400)
RBC: 4.58 MIL/uL (ref 4.22–5.81)
RDW: 12.9 % (ref 11.5–15.5)
WBC: 13.2 10*3/uL — AB (ref 4.0–10.5)

## 2017-11-10 LAB — BASIC METABOLIC PANEL
ANION GAP: 11 (ref 5–15)
Anion gap: 10 (ref 5–15)
BUN: 22 mg/dL — ABNORMAL HIGH (ref 6–20)
BUN: 23 mg/dL — ABNORMAL HIGH (ref 6–20)
CHLORIDE: 88 mmol/L — AB (ref 101–111)
CO2: 34 mmol/L — AB (ref 22–32)
CO2: 33 mmol/L — AB (ref 22–32)
CREATININE: 0.74 mg/dL (ref 0.61–1.24)
Calcium: 8.2 mg/dL — ABNORMAL LOW (ref 8.9–10.3)
Calcium: 8.3 mg/dL — ABNORMAL LOW (ref 8.9–10.3)
Chloride: 87 mmol/L — ABNORMAL LOW (ref 101–111)
Creatinine, Ser: 0.74 mg/dL (ref 0.61–1.24)
GFR calc Af Amer: >60 mL/min (ref >60)
GFR calc non Af Amer: >60 mL/min (ref >60)
GFR calc non Af Amer: >60 mL/min (ref >60)
GLUCOSE: 90 mg/dL (ref 65–99)
Glucose, Bld: 97 mg/dL (ref 65–99)
Potassium: 2.8 mmol/L — ABNORMAL LOW (ref 3.5–5.1)
Potassium: 3.1 mmol/L — ABNORMAL LOW (ref 3.5–5.1)
Sodium: 132 mmol/L — ABNORMAL LOW (ref 135–145)
Sodium: 131 mmol/L — ABNORMAL LOW (ref 135–145)

## 2017-11-10 LAB — URINE CULTURE: Culture: >=100000 — AB

## 2017-11-10 MED ORDER — POTASSIUM CHLORIDE 10 MEQ/100ML IV SOLN
10.0000 meq | INTRAVENOUS | Status: AC
  Administered 2017-11-10 (×5): 10 meq via INTRAVENOUS
  Filled 2017-11-10 (×6): qty 100

## 2017-11-10 MED ORDER — CALCIUM CARBONATE ANTACID 500 MG PO CHEW: 2.0000 | CHEWABLE_TABLET | Freq: Three times a day (TID) | ORAL | Status: DC

## 2017-11-10 MED ORDER — POTASSIUM CHLORIDE CRYS ER 20 MEQ PO TBCR
40.0000 meq | EXTENDED_RELEASE_TABLET | ORAL | Status: AC
  Administered 2017-11-10 (×2): 40 meq via ORAL
  Filled 2017-11-10 (×2): qty 2

## 2017-11-10 MED ORDER — CALCIUM CARBONATE ANTACID 500 MG PO CHEW
2.0000 | CHEWABLE_TABLET | Freq: Three times a day (TID) | ORAL | Status: DC
Start: 2017-11-10 — End: 2017-11-12
  Administered 2017-11-10 – 2017-11-12 (×6): 400 mg via ORAL
  Filled 2017-11-10 (×5): qty 2

## 2017-11-10 MED ORDER — LEVOTHYROXINE SODIUM 100 MCG PO TABS
100.0000 ug | ORAL_TABLET | Freq: Every day | ORAL | Status: DC
  Administered 2017-11-11 – 2017-11-12 (×2): 100 ug via ORAL
  Filled 2017-11-10 (×2): qty 1

## 2017-11-10 NOTE — Telephone Encounter (Signed)
Returned call to Pt son.   Broadly discussed Pt's hospitalization.  Notified per last hospitalist note plan to send Pt back to SNF in 24 to 48 hours. Son thanked this nurse for call.  Pt wants Dr. Rayann Heman to know "treat [place] like kings and queens or I will haunt you".  Told son sounds like Pt is improving.

## 2017-11-10 NOTE — Progress Notes (Addendum)
PROGRESS NOTE    Richard Rosales  HMC:[id number] DOB: [birth year] DOA: 11/08/2017 PCP: Unk Pinto, MD    Brief Narrative:  84 year old male presented with a chief complaint of weakness and confusion.  He does have the significant past medical history for hypertension, chronic atrial fibrillation, COPD with chronic hypoxic respirator failure, diastolic heart failure, hypothyroidism, MGUS and sleep apnea.  Patient was found very weak and deconditioned, EMS was called, he was found to have bradycardia with a few runs of ventricular tachycardia.  Patient had significant decreased oral intake.  He is a nursing home resident, recently admitted to the hospital for bilateral extremity weakness and found to have cervical spinal stenosis, started on Decadron.  On his initial physical examination blood pressure 124/57, heart rate 48, respiratory rate 22, oxygen saturation 95%.  Dry mucous membranes, his lungs were clear to auscultation bilaterally, heart S1-S2 present, rhythmic, abdomen soft nontender, positive pitting bilateral lower extremity edema. UA with 21-50 wbc.   Patient was admitted to the hospital with a working diagnosis of generalized weakness due to significant hypokalemia and dehydration, complicated by urinary tract infection and metabolic encephalopathy.   Assessment & Plan:   Principal Problem:   Weakness Active Problems:   OSA and COPD overlap syndrome (HCC)   Essential hypertension   Atrial fibrillation (HCC)   Chronic diastolic heart failure (HCC)   COPD (chronic obstructive pulmonary disease) (HCC)   Hypothyroidism   Venous ulcers of both lower extremities (HCC)   Hypokalemia   A-fib ([place])  1.  Generalized weakness due to dehydration and hypokalemia. Continue gentle hydration, continue to hold diuretics. No clinical signs of volume overload, will continue K correction with kcl, will check basic metabolic panel this pm, will prefer oral kcl to avoid IV burning sensation.  Follow renal panel in am. Physical therapy recommendations for SNF at discharge.   2.  Urinary tract infection due to enterobacter (present on admission). Enterobacter sensitive to cephalosporins, will continue IV  antibiotic therapy with ceftriaxone. Renal US with no signs of obstructive uropathy.   3.  Heart failure with preserved systolic function. Continue with carvedilol, holding diuresis. Continue gentle hydration with isotonic saline.    4.  COPD. No current clinical signs of exacerbation. On albuterol, tiotropium amd dulera. Oxymetry monitoring.   5. Hypothyroidism. Will resume levothyroxine per home regimen.   DVT prophylaxis: heparin  Code Status: dnr Family Communication: I spoke with patient's family at the bedside and all questions were addressed.  Disposition Plan: snf in 24 to 48 hours   Consultants:     Procedures:     Antimicrobials:   IV ceftriaxone.      Subjective: Patient is feeling better, but continue to feeling weak and deconditioned, no dyspnea, no chest pain, no nausea or vomiting.   Objective: Vitals:   11/10/17 0700 11/10/17 0927 11/10/17 0928 11/10/17 1100  BP: (!) 139/105   103/66  Pulse: 79   (!) 36  Resp: 20   19  Temp: 98.2 F (36.8 C)   (!) 97.4 F (36.3 C)  TempSrc: Oral   Oral  SpO2: 98% 100% 98% 99%  Weight:      Height:        Intake/Output Summary (Last 24 hours) at 11/10/2017 1233 Last data filed at 11/10/2017 0900 Gross per 24 hour  Intake 1287.5 ml  Output 101 ml  Net 1186.5 ml   Filed Weights   11/08/17 2146 11/09/17 0300 11/10/17 0550  Weight: 101.9 kg (224 lb 10.4  oz) 104.2 kg (229 lb 11.5 oz) 104.9 kg (231 lb 4.2 oz)    Examination:   General: dcondotioned Neurology: Awake and alert, non focal  E ENT: mild pallor, no icterus, oral mucosa moist Cardiovascular: No JVD. S1-S2 present, rhythmic, no gallops, rubs, or murmurs. Trace non ptitting lower extremity edema. Pulmonary: vesicular breath sounds  bilaterally, adequate air movement, no wheezing, rhonchi or rales. Gastrointestinal. Abdomen with no organomegaly, non tender, no rebound or guarding Skin. No rashes Musculoskeletal: no joint deformities     Data Reviewed: I have personally reviewed following labs and imaging studies  CBC: Recent Labs  Lab 11/08/17 1531 11/09/17 0333 11/10/17 0242  WBC 18.4* 14.0* 13.2*  NEUTROABS 15.2*  --  9.9*  HGB 15.1 14.3 13.7  HCT 45.1 43.2 41.2  MCV 89.7 92.3 90.0  PLT 227 203 585   Basic Metabolic Panel: Recent Labs  Lab 11/08/17 1531 11/09/17 0333 11/09/17 0536 11/10/17 0242  NA 134* 136 135 132*  K 2.9* 7.3* 3.9 2.8*  CL 84* 94* 89* 87*  CO2 38* 35* 33* 34*  GLUCOSE 122* 88 81 90  BUN 29* 26* 27* 22*  CREATININE 0.88 0.77 0.77 0.74  CALCIUM 8.6* 7.6* 8.6* 8.2*  MG  --   --  2.0  --   PHOS  --   --  2.4*  --    GFR: Estimated Creatinine Clearance: 82.1 mL/min (by C-G formula based on SCr of 0.74 mg/dL). Liver Function Tests: No results for input(s): AST, ALT, ALKPHOS, BILITOT, PROT, ALBUMIN in the last 168 hours. No results for input(s): LIPASE, AMYLASE in the last 168 hours. No results for input(s): AMMONIA in the last 168 hours. Coagulation Profile: No results for input(s): INR, PROTIME in the last 168 hours. Cardiac Enzymes: No results for input(s): CKTOTAL, CKMB, CKMBINDEX, TROPONINI in the last 168 hours. BNP (last 3 results) Recent Labs    04/07/17 1632  PROBNP 230.0*   HbA1C: No results for input(s): HGBA1C in the last 72 hours. CBG: No results for input(s): GLUCAP in the last 168 hours. Lipid Profile: No results for input(s): CHOL, HDL, LDLCALC, TRIG, CHOLHDL, LDLDIRECT in the last 72 hours. Thyroid Function Tests: Recent Labs    11/08/17 1531  TSH 1.951  FREET4 1.76   Anemia Panel: No results for input(s): VITAMINB12, FOLATE, FERRITIN, TIBC, IRON, RETICCTPCT in the last 72 hours.    Radiology Studies: I have reviewed all of the imaging  during this hospital visit personally     Scheduled Meds: . aspirin EC  81 mg Oral Q M,W,F  . azelastine  1 spray Each Nare BID  . B-complex with vitamin C  1 tablet Oral Daily  . carvedilol  6.25 mg Oral BID WC  . cholecalciferol  4,000 Units Oral Daily  . docusate sodium  100 mg Oral BID  . docusate sodium  100 mg Oral BID  . mometasone-formoterol  2 puff Inhalation BID  . multivitamin with minerals  1 tablet Oral Daily  . omega-3 acid ethyl esters  1 g Oral BID  . pantoprazole  40 mg Oral Daily  . tiotropium  1 capsule Inhalation Daily   Continuous Infusions: . cefTRIAXone (ROCEPHIN)  IV Stopped (11/09/17 2355)     LOS: 2 days        [place] Droll Gerome Apley, MD [facility] Pager [phone]

## 2017-11-10 NOTE — Progress Notes (Signed)
Pt placed on auto titrate Cpap with 2 lpm bled in Oxygen tolerating well

## 2017-11-11 ENCOUNTER — Inpatient Hospital Stay (HOSPITAL_COMMUNITY): Payer: Medicare Other

## 2017-11-11 DIAGNOSIS — I481 Persistent atrial fibrillation: Secondary | ICD-10-CM

## 2017-11-11 LAB — BASIC METABOLIC PANEL
Anion gap: 10 (ref 5–15)
BUN: 18 mg/dL (ref 6–20)
CHLORIDE: 91 mmol/L — AB (ref 101–111)
CO2: 30 mmol/L (ref 22–32)
CREATININE: 0.62 mg/dL (ref 0.61–1.24)
Calcium: 8.5 mg/dL — ABNORMAL LOW (ref 8.9–10.3)
GFR calc Af Amer: >60 mL/min (ref >60)
GFR calc non Af Amer: >60 mL/min (ref >60)
GLUCOSE: 98 mg/dL (ref 65–99)
Potassium: 4.1 mmol/L (ref 3.5–5.1)
Sodium: 131 mmol/L — ABNORMAL LOW (ref 135–145)

## 2017-11-11 LAB — CBC WITH DIFFERENTIAL/PLATELET
Abs Immature Granulocytes: 0.1 10*3/uL (ref 0.0–0.1)
Basophils Absolute: 0 10*3/uL (ref 0.0–0.1)
Basophils Relative: 0 %
EOS ABS: 0.3 10*3/uL (ref 0.0–0.7)
EOS PCT: 3 %
HEMATOCRIT: 41 % (ref 39.0–52.0)
Hemoglobin: 13.7 g/dL (ref 13.0–17.0)
Immature Granulocytes: 1 %
LYMPHS ABS: 2 10*3/uL (ref 0.7–4.0)
Lymphocytes Relative: 17 %
MCH: 29.9 pg (ref 26.0–34.0)
MCHC: 33.4 g/dL (ref 30.0–36.0)
MCV: 89.5 fL (ref 78.0–100.0)
MONO ABS: 1 10*3/uL (ref 0.1–1.0)
MONOS PCT: 9 %
Neutro Abs: 8 10*3/uL — ABNORMAL HIGH (ref 1.7–7.7)
Neutrophils Relative %: 70 %
Platelets: 181 10*3/uL (ref 150–400)
RBC: 4.58 MIL/uL (ref 4.22–5.81)
RDW: 12.8 % (ref 11.5–15.5)
WBC: 11.4 10*3/uL — ABNORMAL HIGH (ref 4.0–10.5)

## 2017-11-11 LAB — BRAIN NATRIURETIC PEPTIDE: B Natriuretic Peptide: 646.8 pg/mL — ABNORMAL HIGH (ref 0.0–100.0)

## 2017-11-11 MED ORDER — CEFDINIR 300 MG PO CAPS
300.0000 mg | ORAL_CAPSULE | Freq: Two times a day (BID) | ORAL | Status: DC
  Administered 2017-11-11 – 2017-11-12 (×3): 300 mg via ORAL
  Filled 2017-11-11 (×3): qty 1

## 2017-11-11 NOTE — Progress Notes (Signed)
PROGRESS NOTE    Richard Rosales  ONG:[id number] DOB: 06-Jan-1931 DOA: 11/08/2017 PCP: Unk Pinto, MD    Brief Narrative:  84 year old male presented with a chief complaint of weakness and confusion. He does have the significant past medical history for hypertension, chronic atrial fibrillation, COPD with chronic hypoxic respirator failure, diastolic heart failure, hypothyroidism, MGUS and sleep apnea.Patient was found very weak and deconditioned, EMS was called, he was found to havebradycardia with a few runs of ventricular tachycardia. Patient had significant decreased oral intake. He is a nursing home resident, recently admitted to the hospital for bilateral extremity weakness and found to have cervical spinal stenosis, started on Decadron.On his initial physical examination blood pressure 124/57, heart rate 48, respiratory rate 22, oxygen saturation 95%.Dry mucous membranes, his lungs were clear to auscultation bilaterally,heart S1-S2 present, rhythmic, abdomen soft nontender, positive pitting bilateral lower extremity edema. UA with 21-50 wbc.  Patient was admitted to the hospital with a working diagnosis of generalized weakness due to significant hypokalemia and dehydration, complicated by urinary tract infection and metabolic encephalopathy.    Assessment & Plan:   Principal Problem:   Weakness Active Problems:   OSA and COPD overlap syndrome (HCC)   Essential hypertension   Atrial fibrillation (HCC)   Chronic diastolic heart failure (HCC)   COPD (chronic obstructive pulmonary disease) (HCC)   Hypothyroidism   Venous ulcers of both lower extremities (HCC)   Hypokalemia   A-fib ([place])  1.Generalized weakness due to dehydration and hypokalemia.   Dehydration seems improved.  Patient was given gentle IV fluids with diuretics held.  Physical therapy/Occupational Therapy has recommended return to skilled nursing facility at time of discharge.  IV fluids currently on  hold, see below   2.Urinary tract infection due to enterobacter (present on admission). Enterobacter sensitive to cephalosporins, and has been continued on IV  antibiotic therapy with ceftriaxone. Renal US with no signs of obstructive uropathy.  Leukocytosis improving.  Patient afebrile.  Will transition patient to oral antibiotic.   3.Heart failure with preserved systolic function. Continue with carvedilol.  Diuretics on hold as per above.  Patient noted to have increased O2 requirements as high as 3 L nasal cannula.  Most recent BNP of over 400.  Will check chest x-ray and repeat BNP to rule out acute heart failure.  Hold off on any further IV fluids for now.  4.COPD. No current clinical signs of exacerbation. On albuterol, tiotropium amd dulera. Patient is continued on 3 L nasal cannula as per above  5. Hypothyroidism.  Have resumed levothyroxine per home regimen.   DVT prophylaxis: SCD's Code Status: DNR Family Communication: Pt in room, family not at bedside Disposition Plan: SNF when off O2  Consultants:     Procedures:     Antimicrobials: Anti-infectives (From admission, onward)   Start     Dose/Rate Route Frequency Ordered Stop   11/09/17 0000  cefTRIAXone (ROCEPHIN) 1 g in sodium chloride 0.9 % 100 mL IVPB     1 g 200 mL/hr over 30 Minutes Intravenous Every 24 hours 11/08/17 2118     11/08/17 2200  doxycycline (VIBRA-TABS) tablet 100 mg  Status:  Discontinued    Note to Pharmacy:  10 day course started 11/06/17 pm     100 mg Oral 2 times daily 11/08/17 2038 11/08/17 2118   11/08/17 1930  cefTRIAXone (ROCEPHIN) 1 g in sodium chloride 0.9 % 100 mL IVPB     1 g 200 mL/hr over 30 Minutes Intravenous  Once  11/08/17 1923 11/08/17 2058       Subjective: Without complaints at this time  Objective: Vitals:   11/11/17 0425 11/11/17 0740 11/11/17 0753 11/11/17 1151  BP: (!) 109/57 (!) 97/47  108/66  Pulse: 91   75  Resp: 18   (!) 24  Temp: (!) 97.4 F (36.3 C)  98.1 F (36.7 C)  99.2 F (37.3 C)  TempSrc: Axillary Oral  Oral  SpO2: 91%  93% 93%  Weight: 108 kg (238 lb 1.6 oz)     Height:        Intake/Output Summary (Last 24 hours) at 11/11/2017 1530 Last data filed at 11/10/2017 1700 Gross per 24 hour  Intake 240 ml  Output -  Net 240 ml   Filed Weights   11/09/17 0300 11/10/17 0550 11/11/17 0425  Weight: 104.2 kg (229 lb 11.5 oz) 104.9 kg (231 lb 4.2 oz) 108 kg (238 lb 1.6 oz)    Examination:  General exam: Appears calm and comfortable  Respiratory system: Clear to auscultation. Respiratory effort normal. Cardiovascular system: S1 & S2 heard, RRR Gastrointestinal system: Abdomen is nondistended, soft and nontender. No organomegaly or masses felt. Normal bowel sounds heard. Central nervous system: Alert and oriented. No focal neurological deficits. Extremities: Symmetric 5 x 5 power. Skin: No rashes, lesions Psychiatry: Judgement and insight appear normal. Mood & affect appropriate.   Data Reviewed: I have personally reviewed following labs and imaging studies  CBC: Recent Labs  Lab 11/08/17 1531 11/09/17 0333 11/10/17 0242 11/11/17 0238  WBC 18.4* 14.0* 13.2* 11.4*  NEUTROABS 15.2*  --  9.9* 8.0*  HGB 15.1 14.3 13.7 13.7  HCT 45.1 43.2 41.2 41.0  MCV 89.7 92.3 90.0 89.5  PLT 227 203 204 734   Basic Metabolic Panel: Recent Labs  Lab 11/09/17 0333 11/09/17 0536 11/10/17 0242 11/10/17 1351 11/11/17 0238  NA 136 135 132* 131* 131*  K 7.3* 3.9 2.8* 3.1* 4.1  CL 94* 89* 87* 88* 91*  CO2 35* 33* 34* 33* 30  GLUCOSE 88 81 90 97 98  BUN 26* 27* 22* 23* 18  CREATININE 0.77 0.77 0.74 0.74 0.62  CALCIUM 7.6* 8.6* 8.2* 8.3* 8.5*  MG  --  2.0  --   --   --   PHOS  --  2.4*  --   --   --    GFR: Estimated Creatinine Clearance: 83.2 mL/min (by C-G formula based on SCr of 0.62 mg/dL). Liver Function Tests: No results for input(s): AST, ALT, ALKPHOS, BILITOT, PROT, ALBUMIN in the last 168 hours. No results for input(s):  LIPASE, AMYLASE in the last 168 hours. No results for input(s): AMMONIA in the last 168 hours. Coagulation Profile: No results for input(s): INR, PROTIME in the last 168 hours. Cardiac Enzymes: No results for input(s): CKTOTAL, CKMB, CKMBINDEX, TROPONINI in the last 168 hours. BNP (last 3 results) Recent Labs    04/07/17 1632  PROBNP 230.0*   HbA1C: No results for input(s): HGBA1C in the last 72 hours. CBG: No results for input(s): GLUCAP in the last 168 hours. Lipid Profile: No results for input(s): CHOL, HDL, LDLCALC, TRIG, CHOLHDL, LDLDIRECT in the last 72 hours. Thyroid Function Tests: Recent Labs    11/08/17 1531  TSH 1.951  FREET4 1.76   Anemia Panel: No results for input(s): VITAMINB12, FOLATE, FERRITIN, TIBC, IRON, RETICCTPCT in the last 72 hours. Sepsis Labs: Recent Labs  Lab 11/08/17 1550 11/08/17 1929 11/09/17 0333  LATICACIDVEN 2.53* 0.99 0.9    Recent  Results (from the past 240 hour(s))  Urine culture     Status: Abnormal   Collection Time: 11/08/17  6:00 PM  Result Value Ref Range Status   Specimen Description URINE, RANDOM  Final   Special Requests   Final    NONE Performed at [facility], [address], [place], [place] [address]    Culture >=100,000 COLONIES/mL ENTEROBACTER CLOACAE (A)  Final   Report Status 11/10/2017 FINAL  Final   Organism ID, Bacteria ENTEROBACTER CLOACAE (A)  Final      Susceptibility   Enterobacter cloacae - MIC*    CEFAZOLIN >=64 RESISTANT Resistant     CEFTRIAXONE <=1 SENSITIVE Sensitive     CIPROFLOXACIN <=0.25 SENSITIVE Sensitive     GENTAMICIN <=1 SENSITIVE Sensitive     IMIPENEM <=0.25 SENSITIVE Sensitive     NITROFURANTOIN 64 INTERMEDIATE Intermediate     TRIMETH/SULFA <=20 SENSITIVE Sensitive     PIP/TAZO 16 SENSITIVE Sensitive     * >=100,000 COLONIES/mL ENTEROBACTER CLOACAE  MRSA PCR Screening     Status: None   Collection Time: 11/08/17 10:12 PM  Result Value Ref Range Status   MRSA by PCR  NEGATIVE NEGATIVE Final    Comment:        The GeneXpert MRSA Assay (FDA approved for NASAL specimens only), is one component of a comprehensive MRSA colonization surveillance program. It is not intended to diagnose MRSA infection nor to guide or monitor treatment for MRSA infections. Performed at [facility], [place] [address] [place], [place] [address]      Radiology Studies: US Renal  Result Date: 11/09/2017 CLINICAL DATA:  Initial evaluation for urinary tract infection. EXAM: RENAL / URINARY TRACT ULTRASOUND COMPLETE COMPARISON:  None. FINDINGS: Right Kidney: Length: 10.8 cm. Echogenicity grossly within normal limits. No mass lesion. No nephrolithiasis or hydronephrosis. Left Kidney: Length: 11.7 cm. Echogenicity grossly within normal limits. 4.1 x 4.4 x 4.8 cm exophytic cyst present at the lower pole of the left kidney. No associated vascularity. No hydronephrosis or nephrolithiasis. Bladder: Appears normal for degree of bladder distention. IMPRESSION: 1. No acute abnormality.  No hydronephrosis. 2. 4.8 cm exophytic left renal cyst. Electronically Signed   By: Jeannine Boga [name]   On: 11/09/2017 21:51    Scheduled Meds: . aspirin EC  81 mg Oral Q M,W,F  . azelastine  1 spray Each Nare BID  . B-complex with vitamin C  1 tablet Oral Daily  . calcium carbonate  2 tablet Oral TID WC  . carvedilol  6.25 mg Oral BID WC  . cholecalciferol  4,000 Units Oral Daily  . docusate sodium  100 mg Oral BID  . levothyroxine  100 mcg Oral QAC breakfast  . mometasone-formoterol  2 puff Inhalation BID  . multivitamin with minerals  1 tablet Oral Daily  . omega-3 acid ethyl esters  1 g Oral BID  . pantoprazole  40 mg Oral Daily  . tiotropium  1 capsule Inhalation Daily   Continuous Infusions: . cefTRIAXone (ROCEPHIN)  IV Stopped (11/11/17 0225)     LOS: 3 days   Marylu Lund, MD [facility] Pager [phone]  If 7PM-7AM, please contact  night-coverage [email or web address] Password [email or web address] 11/11/2017, 3:30 PM

## 2017-11-11 NOTE — Progress Notes (Signed)
Occupational Therapy Treatment Patient Details Name: Richard Rosales MRN: [id number] DOB: 05-Jul-1930 Today's Date: 11/11/2017    History of present illness 84 y.o. male presented to the hospital with increasing weakness and difficulty ambulation. PHM including medical history significant of hypertension, atrial fibrillation not on anticoagulation, COPD on 2 L of nasal cannula, congestive heart failure, hypothyroidism, history of MGUS, sleep apnea on CPAP and venous stasis. Pt with recent admission for BLEs weakness and had cervical spinal stenosis. Discharged to SNF for rehabilitation.    OT comments  Pt progressing towards established OT goals. Pt received supine in bed with urinary and bowl incontinence. Pt requiring Max A +2 to roll side to side and complete toilet hygiene at bed level. Pt performing simulated toilet transfer to recliner with Max A +2 and sara stedy. Continue to recommend dc to SNF for further OT and will continue to follow acutely as admitted.    Follow Up Recommendations  SNF;Supervision/Assistance - 24 hour    Equipment Recommendations  Other (comment)(Defer to next venue)    Recommendations for Other Services PT consult    Precautions / Restrictions Precautions Precautions: Fall Precaution Comments: O2 dep Restrictions Weight Bearing Restrictions: No       Mobility Bed Mobility Overal bed mobility: Needs Assistance Bed Mobility: Rolling;Supine to Sit Rolling: Max assist;+2 for safety/equipment   Supine to sit: Max assist;+2 for physical assistance     General bed mobility comments: Pt able to bring BLEs towards EOB. Requiring Max A to elevate trunk and pivot hips using bed pad.  Transfers Overall transfer level: Needs assistance Equipment used: 2 person hand held assist Transfers: Sit to/from [facility] Sit to Stand: Max assist;+2 physical assistance         General transfer comment: patient able to utilize stedy this visit, max  A to power up with bed pad to support hips, once standing inside stedy, able to tolerate standing for 1 minute, fatigues quickly    Balance Overall balance assessment: Needs assistance Sitting-balance support: No upper extremity supported;Feet supported Sitting balance-Leahy Scale: Fair Sitting balance - Comments: Able to maintain sitting at EOB. fatigues Postural control: Other (comment)(forward flexion of shoulder and neck) Standing balance support: Bilateral upper extremity supported;During functional activity Standing balance-Leahy Scale: Poor Standing balance comment: unable to stand without physical assist                           ADL either performed or assessed with clinical judgement   ADL Overall ADL's : Needs assistance/impaired                     Lower Body Dressing: Bed level;Total assistance Lower Body Dressing Details (indicate cue type and reason): Donning socks Toilet Transfer: Maximal assistance;+2 for physical assistance(simulated to recliner; sarar stedy) Toilet Transfer Details (indicate cue type and reason): Performing sit<>Stand with use of sara stedy and Max A +2 for simulated toilet transfer to [place] and Hygiene: Maximal assistance;+2 for physical assistance;Bed level Toileting - Clothing Manipulation Details (indicate cue type and reason): Pt requiring Max A +2 to roll at bed level for peri care. Pt able to lay in supine to clean front.     Functional mobility during ADLs: Maximal assistance;+2 for physical assistance(Sara Stedy) General ADL Comments: Pt recieved at bed level with urinar and bowl incontinence. Requiring Max A +2 to roll side to side and for toilet hygiene. Pt able to participate  and clean front of peri area in supine with HOB slightly elevated. Pt performing simulated toilet transfer to recliner with Max A +2 and sara stedy. Demonstrating increased acitvity tolerance.      Vision        Perception     Praxis      Cognition Arousal/Alertness: Awake/alert Behavior During Therapy: WFL for tasks assessed/performed Overall Cognitive Status: Impaired/Different from baseline Area of Impairment: Memory;Following commands;Problem solving;Orientation;Awareness                 Orientation Level: Disoriented to;Time;Place   Memory: Decreased short-term memory Following Commands: Follows one step commands with increased time   Awareness: Emergent Problem Solving: Slow processing;Requires verbal cues;Requires tactile cues General Comments: Pt presenting with increased attention and following commands compared to prior session. Continues to joke throughout session. Presenting with memory deficits and disconnected thoughts during conversation. Very agreeable to therapy.         Exercises     Shoulder Instructions       General Comments VSS    Pertinent Vitals/ Pain       Pain Assessment: Faces Faces Pain Scale: Hurts little more Pain Location: Generalized Pain Descriptors / Indicators: Aching;Sore;Discomfort Pain Intervention(s): Monitored during session;Limited activity within patient's tolerance;Repositioned  Home Living Family/patient expects to be discharged to:: Skilled nursing facility Living Arrangements: Spouse/significant other Available Help at Discharge: Family Type of Home: House                                  Prior Functioning/Environment              Frequency  Min 2X/week        Progress Toward Goals  OT Goals(current goals can now be found in the care plan section)  Progress towards OT goals: Progressing toward goals  Acute Rehab OT Goals Patient Stated Goal: Get stronger OT Goal Formulation: With patient Time For Goal Achievement: 11/23/17 Potential to Achieve Goals: Good ADL Goals Pt Will Perform Grooming: with set-up;with supervision;sitting Pt Will Perform Upper Body Dressing: sitting;with  supervision;with set-up Pt Will Perform Lower Body Dressing: with mod assist;sit to/from stand;sitting/lateral leans Pt Will Transfer to Toilet: with mod assist;stand pivot transfer;bedside commode Pt Will Perform Toileting - Clothing Manipulation and hygiene: with mod assist;sit to/from stand;sitting/lateral leans Additional ADL Goal #1: Pt will perform bed mobility with Min A +2 in preparation for ADLs  Plan Discharge plan remains appropriate    Co-evaluation    PT/OT/SLP Co-Evaluation/Treatment: Yes Reason for Co-Treatment: Complexity of the patient's impairments (multi-system involvement);For patient/therapist safety;To address functional/ADL transfers PT goals addressed during session: Mobility/safety with mobility;Balance;Proper use of DME;Strengthening/ROM OT goals addressed during session: ADL's and self-care      AM-PAC PT "6 Clicks" Daily Activity     Outcome Measure   Help from another person eating meals?: A Little Help from another person taking care of personal grooming?: A Little Help from another person toileting, which includes using toliet, bedpan, or urinal?: A Lot Help from another person bathing (including washing, rinsing, drying)?: A Lot Help from another person to put on and taking off regular upper body clothing?: A Lot Help from another person to put on and taking off regular lower body clothing?: A Lot 6 Click Score: 14    End of Session Equipment Utilized During Treatment: Gait belt;Oxygen;Other (comment)(Sara Stedy)  OT Visit Diagnosis: Unsteadiness on feet (R26.81);Other abnormalities of gait and mobility (  R26.89);Muscle weakness (generalized) (M62.81);Pain Pain - Right/Left: (Bilateral) Pain - part of body: Leg;Ankle and joints of foot;Hip;Shoulder   Activity Tolerance Patient tolerated treatment well;Patient limited by fatigue   Patient Left in chair;with call bell/phone within reach;with chair alarm set   Nurse Communication Mobility status;Need  for lift equipment(No chair alarm box in room)        Time: 1001-1039 OT Time Calculation (min): 38 min  Charges: OT General Charges $OT Visit: 1 Visit OT Treatments $Self Care/Home Management : 23-37 mins  [place], OTR/L Acute Rehab Pager: [phone] Office: [place] 11/11/2017, 2:04 PM

## 2017-11-11 NOTE — Evaluation (Signed)
Physical Therapy Evaluation Patient Details Name: Richard Rosales MRN: [id number] DOB: [birth year] Today's Date: 11/11/2017   History of Present Illness  84 y.o. male presented to the hospital with increasing weakness and difficulty ambulation. PHM including medical history significant of hypertension, atrial fibrillation not on anticoagulation, COPD on 2 L of nasal cannula, congestive heart failure, hypothyroidism, history of MGUS, sleep apnea on CPAP and venous stasis. Pt with recent admission for BLEs weakness and had cervical spinal stenosis. Discharged to SNF for rehabilitation.      Clinical Impression  Patient with mild progression this visit, able to move legs more independently with bed mobility. Also able to utilize [place] this visit and help with BUE support on device. Found it easier to stand and tolerate standing with support of Stedy. Still max A x2 for transfers and SNF recs appropriate, updated frequency to reflect.     Follow Up Recommendations SNF    Equipment Recommendations  None recommended by PT    Recommendations for Other Services       Precautions / Restrictions Precautions Precautions: Fall Precaution Comments: O2 dep Restrictions Weight Bearing Restrictions: No      Mobility  Bed Mobility Overal bed mobility: Needs Assistance Bed Mobility: Rolling;Supine to Sit Rolling: Mod assist   Supine to sit: Max assist;+2 for physical assistance     General bed mobility comments: patient with increased bed mobility but ulimately max A to supine to sit to support trunk and using bed pad   Transfers Overall transfer level: Needs assistance Equipment used: 2 person hand held assist Transfers: Sit to/from [employer] Transfers Sit to Stand: Max assist;+2 physical assistance         General transfer comment: patient able to utilize stedy this visit, max A to power up with bed pad to support hips, once standing inside stedy, able to tolerate standing for  1 minute, fatigues quickly  Ambulation/Gait             General Gait Details: unable at this time  Stairs            Wheelchair Mobility    Modified Rankin (Stroke Patients Only)       Balance Overall balance assessment: Needs assistance Sitting-balance support: No upper extremity supported;Feet supported Sitting balance-Leahy Scale: Fair     Standing balance support: Bilateral upper extremity supported;During functional activity Standing balance-Leahy Scale: Poor Standing balance comment: unable to stand without physical assist                             Pertinent Vitals/Pain Pain Assessment: Faces Faces Pain Scale: Hurts little more Pain Location: back Pain Descriptors / Indicators: Aching;Sore;Discomfort Pain Intervention(s): Limited activity within patient's tolerance    Home Living Family/patient expects to be discharged to:: Skilled nursing facility Living Arrangements: Spouse/significant other Available Help at Discharge: Family Type of Home: House                Prior Function                 Hand Dominance        Extremity/Trunk Assessment                Communication      Cognition Arousal/Alertness: Awake/alert Behavior During Therapy: WFL for tasks assessed/performed Overall Cognitive Status: Impaired/Different from baseline Area of Impairment: Memory;Following commands;Problem solving;Orientation;Awareness  Orientation Level: Disoriented to;Time;Place   Memory: Decreased short-term memory Following Commands: Follows one step commands with increased time   Awareness: Emergent Problem Solving: Slow processing;Requires verbal cues;Requires tactile cues        General Comments General comments (skin integrity, edema, etc.): incread time for pericare    Exercises     Assessment/Plan    PT Assessment    PT Problem List         PT Treatment Interventions      PT Goals  (Current goals can be found in the Care Plan section)  Acute Rehab PT Goals Patient Stated Goal: Get stronger PT Goal Formulation: With patient/family Time For Goal Achievement: 11/24/17 Potential to Achieve Goals: Good    Frequency Min 2X/week   Barriers to discharge        Co-evaluation PT/OT/SLP Co-Evaluation/Treatment: Yes Reason for Co-Treatment: For patient/therapist safety;To address functional/ADL transfers PT goals addressed during session: Mobility/safety with mobility;Balance;Proper use of DME;Strengthening/ROM OT goals addressed during session: ADL's and self-care;Proper use of Adaptive equipment and DME;Strengthening/ROM;Other (comment)       AM-PAC PT "6 Clicks" Daily Activity  Outcome Measure Difficulty turning over in bed (including adjusting bedclothes, sheets and blankets)?: Unable Difficulty moving from lying on back to sitting on the side of the bed? : Unable Difficulty sitting down on and standing up from a chair with arms (e.g., wheelchair, bedside commode, etc,.)?: Unable Help needed moving to and from a bed to chair (including a wheelchair)?: A Lot Help needed walking in hospital room?: A Lot Help needed climbing 3-5 steps with a railing? : Total 6 Click Score: 8    End of Session Equipment Utilized During Treatment: Gait belt;Oxygen Activity Tolerance: Patient tolerated treatment well Patient left: in chair;with call bell/phone within reach;with family/visitor present   PT Visit Diagnosis: Muscle weakness (generalized) (M62.81);Difficulty in walking, not elsewhere classified (R26.2);Unsteadiness on feet (R26.81);Other abnormalities of gait and mobility (R26.89)    Time: 1000-1039 PT Time Calculation (min) (ACUTE ONLY): 39 min   Charges:     PT Treatments $Therapeutic Activity: 8-22 mins   PT G Codes:        Reinaldo Berber, PT, DPT Acute Rehab Services Pager: [phone]    Reinaldo Berber 11/11/2017, 11:44 AM

## 2017-11-11 NOTE — Care Management Important Message (Signed)
Important Message  Patient Details  Name: Richard Rosales MRN: [id number] Date of Birth: [birth year]   Medicare Important Message Given:  Yes    [name] 11/11/2017, 3:20 PM

## 2017-11-12 LAB — BASIC METABOLIC PANEL
Anion gap: 9 (ref 5–15)
BUN: 14 mg/dL (ref 6–20)
CHLORIDE: 90 mmol/L — AB (ref 101–111)
CO2: 33 mmol/L — AB (ref 22–32)
CREATININE: 0.65 mg/dL (ref 0.61–1.24)
Calcium: 8.6 mg/dL — ABNORMAL LOW (ref 8.9–10.3)
GFR calc Af Amer: >60 mL/min (ref >60)
GFR calc non Af Amer: >60 mL/min (ref >60)
Glucose, Bld: 97 mg/dL (ref 65–99)
Potassium: 4.1 mmol/L (ref 3.5–5.1)
Sodium: 132 mmol/L — ABNORMAL LOW (ref 135–145)

## 2017-11-12 LAB — CBC
HEMATOCRIT: 39 % (ref 39.0–52.0)
Hemoglobin: 12.9 g/dL — ABNORMAL LOW (ref 13.0–17.0)
MCH: 30.1 pg (ref 26.0–34.0)
MCHC: 33.1 g/dL (ref 30.0–36.0)
MCV: 91.1 fL (ref 78.0–100.0)
PLATELETS: 162 10*3/uL (ref 150–400)
RBC: 4.28 MIL/uL (ref 4.22–5.81)
RDW: 13.1 % (ref 11.5–15.5)
WBC: 11.2 10*3/uL — ABNORMAL HIGH (ref 4.0–10.5)

## 2017-11-12 MED ORDER — FUROSEMIDE 20 MG PO TABS: 40.0000 mg | ORAL_TABLET | Freq: Two times a day (BID) | ORAL | 0 refills | Status: DC

## 2017-11-12 MED ORDER — CEFDINIR 300 MG PO CAPS: 300.0000 mg | ORAL_CAPSULE | Freq: Two times a day (BID) | ORAL | 0 refills | Status: DC

## 2017-11-12 NOTE — NC FL2 (Signed)
[place] LEVEL OF CARE SCREENING TOOL     IDENTIFICATION  Patient Name: Richard Rosales Birthdate: July 21, 1930 Sex: male Admission Date (Current Location): 11/08/2017  [facility] and [place] Number:  Herbalist and Address:  [facility], [place] [address], [place], [place] [address]      Provider Number: [id number]  Attending Physician Name and Address:  Donne Hazel, MD  Relative Name and Phone Number:  Elliot Meldrum, [phone]    Current Level of Care: Hospital Recommended Level of Care: [place] Prior Approval Number:    Date Approved/Denied:   PASRR Number: [id number]  Discharge Plan: SNF    Current Diagnoses: Patient Active Problem List   Diagnosis Date Noted  . A-fib ([place]) 11/08/2017  . Weakness 11/08/2017  . Hypokalemia 10/27/2017  . Spinal stenosis of lumbar region at multiple levels 10/26/2017  . Non-healing wound of lower extremity 10/22/2017  . Venous ulcers of both lower extremities ([place]) 08/12/2017  . Physical deconditioning 02/11/2017  . Essential tremor 12/04/2016  . MGUS (monoclonal gammopathy of unknown significance) 11/11/2016  . 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 Polyneuropathy 10/01/2016  . Gait disturbance 07/31/2016  . Aortic atherosclerosis ([place]) 06/02/2016  . Encounter for Medicare annual wellness exam 02/19/2015  . Hypertensive cardiovascular disease 03/06/2014  . Mixed hyperlipidemia 12/15/2013  . Prediabetes 12/15/2013  . Vitamin D deficiency 12/15/2013  . Medication management 12/15/2013  . DJD (degenerative joint disease)   . Allergic rhinitis   . Hypothyroidism   . Obesity, morbid ([place]) 06/13/2013  . COPD (chronic obstructive pulmonary disease) ([place]) 11/01/2009  . OSA and COPD overlap syndrome ([place]) 10/04/2009  . Chronic diastolic heart failure ([place]) 10/04/2009  . Diverticulosis of colon with hemorrhage 09/10/2009  . History of colonic polyps  09/10/2009  . Atrial fibrillation ([place]) 09/11/2008  . Essential hypertension 09/08/2008  . GERD (gastroesophageal reflux disease) 09/08/2008  . BPH with obstruction/lower urinary tract symptoms 09/08/2008    Orientation RESPIRATION BLADDER Height & Weight     Time, Self, Situation, Place  O2(CPAP Adult Large Full face Mask 3lpm), 2l of [place]) External catheter, Incontinent Weight: 234 lb 9.1 oz (106.4 kg) Height:  5\' 9"  (175.3 cm)  BEHAVIORAL SYMPTOMS/MOOD NEUROLOGICAL BOWEL NUTRITION STATUS      Continent (heart land)  AMBULATORY STATUS COMMUNICATION OF NEEDS Skin   Extensive Assist Verbally                         Personal Care Assistance Level of Assistance  Bathing, Feeding, Dressing Bathing Assistance: Maximum assistance Feeding assistance: Independent Dressing Assistance: Maximum assistance     Functional Limitations Info  Sight, Hearing, Speech Sight Info: Adequate Hearing Info: Adequate Speech Info: Adequate    SPECIAL CARE FACTORS FREQUENCY  PT (By licensed PT), OT (By licensed OT)     PT Frequency: 5x wk OT Frequency: 5x wk            Contractures      Additional Factors Info    Code Status Info: DNR Allergies Info: ACE INHIBITORS, BETA ADRENERGIC BLOCKERS, DECADRON DEXAMETHASONE, FLUTICASONE-SALMETEROL, HYTRIN TERAZOSIN, PREDNISONE, VIOXX ROFECOXIB           Current Medications (11/12/2017):  This is the current hospital active medication list Current Facility-Administered Medications  Medication Dose Route Frequency Provider Last Rate Last Dose  . acetaminophen (TYLENOL) tablet 650 mg  650 mg Oral Q6H PRN Flora Lipps, MD  Or  . acetaminophen (TYLENOL) suppository 650 mg  650 mg Rectal Q6H PRN Pokhrel, Laxman, MD      . albuterol (PROVENTIL) (2.5 MG/3ML) 0.083% nebulizer solution 2.5 mg  2.5 mg Nebulization Q6H PRN Arrien, Jimmy Picket, MD      . aspirin EC tablet 81 mg  81 mg Oral Q M,W,F Pokhrel, Laxman, MD   81 mg at 11/11/17 0928   . azelastine (ASTELIN) 0.1 % nasal spray 1 spray  1 spray Each Nare BID Pokhrel, Laxman, MD   1 spray at 11/12/17 1018  . B-complex with vitamin C tablet 1 tablet  1 tablet Oral Daily Pokhrel, Laxman, MD   1 tablet at 11/12/17 1016  . bisacodyl (DULCOLAX) suppository 10 mg  10 mg Rectal Once PRN Pokhrel, Laxman, MD      . bisacodyl (DULCOLAX) suppository 10 mg  10 mg Rectal Daily PRN Pokhrel, Laxman, MD      . calcium carbonate (TUMS - dosed in mg elemental calcium) chewable tablet 400 mg of elemental calcium  2 tablet Oral TID WC Arrien, Jimmy Picket, MD   400 mg of elemental calcium at 11/12/17 0741  . carvedilol (COREG) tablet 6.25 mg  6.25 mg Oral BID WC Pokhrel, Laxman, MD   6.25 mg at 11/12/17 0740  . cefdinir ([name]) capsule 300 mg  300 mg Oral Q12H Donne Hazel, MD   300 mg at 11/12/17 1016  . cholecalciferol (VITAMIN D) tablet 4,000 Units  4,000 Units Oral Daily Pokhrel, Laxman, MD   4,000 Units at 11/12/17 1015  . docusate sodium (COLACE) capsule 100 mg  100 mg Oral BID Pokhrel, Laxman, MD   100 mg at 11/12/17 1014  . HYDROcodone-acetaminophen (NORCO/VICODIN) 5-325 MG per tablet 1-2 tablet  1-2 tablet Oral Q4H PRN Pokhrel, Laxman, MD   1 tablet at 11/10/17 1624  . levothyroxine (SYNTHROID, LEVOTHROID) tablet 100 mcg  100 mcg Oral QAC breakfast Arrien, Jimmy Picket, MD   100 mcg at 11/12/17 0739  . mometasone-formoterol (DULERA) 200-5 MCG/ACT inhaler 2 puff  2 puff Inhalation BID Pokhrel, Laxman, MD   2 puff at 11/12/17 0901  . multivitamin with minerals tablet 1 tablet  1 tablet Oral Daily Pokhrel, Laxman, MD   1 tablet at 11/12/17 1014  . omega-3 acid ethyl esters (LOVAZA) capsule 1 g  1 g Oral BID Pokhrel, Laxman, MD   1 g at 11/12/17 1014  . pantoprazole (PROTONIX) EC tablet 40 mg  40 mg Oral Daily Pokhrel, Laxman, MD   40 mg at 11/12/17 1015  . tiotropium (SPIRIVA) inhalation capsule 18 mcg  1 capsule Inhalation Daily Pokhrel, Laxman, MD   18 mcg at 11/12/17 3295      Discharge Medications: Please see discharge summary for a list of discharge medications.  Relevant Imaging Results:  Relevant Lab Results:   Additional Information SS# [id number]  CPAP  Wende Neighbors, LCSW

## 2017-11-12 NOTE — Progress Notes (Signed)
Pt placed on CPAP auto titrate tolerating well with 2 lpm bled in oxygen

## 2017-11-12 NOTE — Plan of Care (Signed)
Continue current care plan, pt to return to [facility] for rehab.

## 2017-11-12 NOTE — Progress Notes (Signed)
Clinical Social Worker facilitated patient discharge including contacting patient family and facility to confirm patient discharge plans.  Clinical information faxed to facility and family agreeable with plan.  CSW arranged ambulance transport via [facility] to [facility]  .  RN to call [phone] ( pt will go in room 402) for report prior to discharge.  Clinical Social Worker will sign off for now as social work intervention is no longer needed. Please consult [place] again if new need arises.  [name], MSW, [place]

## 2017-11-12 NOTE — Clinical Social Work Note (Signed)
Clinical Social Work Assessment  Patient Details  Name: Richard Rosales MRN: [id number] Date of Birth: August 09, 1930  Date of referral:  11/12/17               Reason for consult:  Discharge Planning, Facility Placement                Permission sought to share information with:  Family Supports Permission granted to share information::  Yes, Verbal Permission Granted  Name::     Tylik Treese  Agency::  [facility]  Relationship::  son  Contact Information:  [phone]  Housing/Transportation Living arrangements for the past 2 months:  [place] of Information:  Patient Patient Interpreter Needed:  None Criminal Activity/Legal Involvement Pertinent to Current Situation/Hospitalization:    Significant Relationships:  Adult Children, Spouse Lives with:  Spouse Do you feel safe going back to the place where you live?  No Need for family participation in patient care:  Yes (Comment)  Care giving concerns:  Patients son Harrell Gave)  at bedside. Patient was very pleasant and funny during assessment. Patent and son both agreeable for patient to  discharge back to facility to continue rehab.   Social Worker assessment / plan: CSW met family and patient at bedside to offer support and discharge needs. Patient stated he would like to return back to facility and son is in agreement. CSW confirmed patient return with admission coordinator at facility. Facility stated they will be able to take patient back once summary is ready. Patient and family agreeable with [facility] transportation.   Employment status:  Retired Forensic scientist:  [employer] PT Recommendations:  [place] / Referral to community resources:  [place]  Patient/Family's Response to care:  Patient very friendly and eager to leave hospital. Patient and family appreciative of the care patient has received  Patient/Family's Understanding of and Emotional Response to  Diagnosis, Current Treatment, and Prognosis: Patient agreeable to discharge plan  Emotional Assessment Appearance:  Appears stated age Attitude/Demeanor/Rapport:  Engaged, Self-Confident Affect (typically observed):  Calm, Accepting, Pleasant, Happy Orientation:  Oriented to Situation, Oriented to  Time, Oriented to Place, Oriented to Self Alcohol / Substance use:  Not Applicable Psych involvement (Current and /or in the community):  No (Comment)  Discharge Needs  Concerns to be addressed:  No discharge needs identified Readmission within the last 30 days:  Yes Current discharge risk:  None Barriers to Discharge:  No Barriers Identified   Wende Neighbors, LCSW 11/12/2017, 11:19 AM

## 2017-11-12 NOTE — Progress Notes (Signed)
Pt going to [facility] via [place], Son chris as pt belongings. Prescriptions, follow up appts. [place] DNR sheet given to ambulance driver.

## 2017-11-12 NOTE — Discharge Summary (Signed)
Physician Discharge Summary  SKEETER SHEARD JIR:[id number] DOB: 07/15/1930 DOA: 11/08/2017  PCP: Unk Pinto, MD  Admit date: 11/08/2017 Discharge date: 11/12/2017  Admitted From: SNF Disposition:  SNF  Recommendations for Outpatient Follow-up:  1. Follow up with PCP in 1-2 weeks 2. Recommend repeat basic metabolic panel in 1 week 3. Complete 3 more days of omnicef  Discharge Condition:Improved CODE STATUS:DNR Diet recommendation: Heart healthy   Brief/Interim Summary: 84 year old male presented with a chief complaint of weakness and confusion. He does have the significant past medical history for hypertension, chronic atrial fibrillation, COPD with chronic hypoxic respirator failure,diastolicheart failure, hypothyroidism, MGUS and sleep apnea.Patient was found very weak and deconditioned, EMS was called, he was found to havebradycardia with a few runs of ventricular tachycardia. Patient had significant decreased oral intake. He is a nursing home resident,recentlyadmitted to the hospital for bilateral extremity weakness andfound to havecervical spinal stenosis, started on Decadron.On his initial physical examination blood pressure 124/57, heart rate 48, respiratoryrate22, oxygen saturation 95%.Dry mucous membranes, his lungswereclear to auscultation bilaterally,heart S1-S2 present, rhythmic, abdomen soft nontender, positive pitting bilateral lower extremity edema. UA with 21-50 wbc.  Patient was admitted to the hospital with a working diagnosis of generalized weakness due to significant hypokalemia and dehydration, complicated by urinary tract infection and metabolic encephalopathy.  1.Generalized weakness due to dehydrationandhypokalemia.  Dehydration had improved with gentle IVF.  Patient was given gentle IV fluids with diuretics held briefly.  Physical therapy/Occupational Therapy has recommended return to skilled nursing facility at time of  discharge.  2.Urinary tract infectiondue to enterobacter (present on admission).Enterobacter sensitive to cephalosporins, and has been continued on IVantibiotic therapy withceftriaxone. Renal US with no signs of obstructive uropathy.  Leukocytosis improving.  Patient afebrile.  Complete 3 more days of omnicef.  3.Heart failure with preserved systolic function.Continue with carvedilol.  Diuretics on hold as per above.  Patient noted to have increased O2 requirements as high as 3 L nasal cannula.  Most recent BNP of over 400.  Repeat bnp stable. CXR unchanged  4.COPD. Nocurrent clinicalsigns of exacerbation. Onalbuterol,tiotropiumamddulera. Patient is continued on 2L nasal cannula as per home regimen  5. Hypothyroidism.  Have resumed levothyroxine per home regimen.   Discharge Diagnoses:  Principal Problem:   Weakness Active Problems:   OSA and COPD overlap syndrome (HCC)   Essential hypertension   Atrial fibrillation (HCC)   Chronic diastolic heart failure (HCC)   COPD (chronic obstructive pulmonary disease) (HCC)   Hypothyroidism   Venous ulcers of both lower extremities (HCC)   Hypokalemia   A-fib (HCC)    Discharge Instructions   Allergies as of 11/12/2017      Reactions   Ace Inhibitors Cough   Beta Adrenergic Blockers Other (See Comments)   bradycardia   Decadron [dexamethasone]    unknown   Fluticasone-salmeterol    Doesn't remember   Hytrin [terazosin]    Nasal congestion   Prednisone    Shakes / tremors   Vioxx [rofecoxib] Other (See Comments)   dyspepsia      Medication List    STOP taking these medications   doxycycline 100 MG tablet Commonly known as:  VIBRA-TABS   metolazone 5 MG tablet Commonly known as:  ZAROXOLYN     TAKE these medications   albuterol 108 (90 Base) MCG/ACT inhaler Commonly known as:  PROVENTIL HFA;VENTOLIN HFA Inhale 2 puffs into the lungs every 6 (six) hours as needed for wheezing or shortness of  breath. What changed:  Another medication with the  same name was changed. Make sure you understand how and when to take each.   albuterol (2.5 MG/3ML) 0.083% nebulizer solution Commonly known as:  PROVENTIL Take 3 mLs (2.5 mg total) by nebulization every 4 (four) hours as needed for wheezing or shortness of breath. What changed:    when to take this  additional instructions   ammonium lactate 12 % lotion Commonly known as:  LAC-HYDRIN Apply topically every morning.   aspirin EC 81 MG tablet Take 81 mg by mouth every Monday, Wednesday, and Friday.   azelastine 0.1 % nasal spray Commonly known as:  ASTELIN USE 1 SPRAY IN EACH NOSTRIL TWICE A DAY   b complex vitamins capsule Take 1 capsule by mouth daily.   calcium carbonate 500 MG chewable tablet Commonly known as:  TUMS - dosed in mg elemental calcium Chew 2 tablets by mouth 3 (three) times daily as needed for indigestion.   carvedilol 6.25 MG tablet Commonly known as:  COREG Take 1 tablet (6.25 mg total) by mouth 2 (two) times daily with a meal.   cefdinir 300 MG capsule Commonly known as:  [name] Take 1 capsule (300 mg total) by mouth every 12 (twelve) hours.   docusate sodium 100 MG capsule Commonly known as:  COLACE Take 100 mg by mouth 2 (two) times daily.   DULCOLAX 10 MG suppository Generic drug:  bisacodyl Place 10 mg rectally once as needed (if no BM in 8 hours after MOM administered).   fish oil-omega-3 fatty acids 1000 MG capsule Take 1 g by mouth 2 (two) times daily.   FLEET ENEMA RE Place 1 each rectally once as needed (if no BM within 2 hours after dulcolax suppository).   furosemide 20 MG tablet Commonly known as:  LASIX Take 2 tablets (40 mg total) by mouth 2 (two) times daily. What changed:  how much to take   GLUCOSAMINE-CHONDROITIN PO Take 1 capsule by mouth daily.   HYDROcodone-acetaminophen 5-325 MG tablet Commonly known as:  NORCO/VICODIN Take 1-2 tablets by mouth every 4 (four)  hours as needed for severe pain.   levothyroxine 100 MCG tablet Commonly known as:  SYNTHROID, LEVOTHROID TAKE 1 TABLET DAILY BEFORE BREAKFAST   MILK OF MAGNESIA PO Take 30 mLs by mouth once as needed (if no BM in 3 days).   multivitamin with minerals Tabs tablet Take 1 tablet by mouth daily.   omeprazole 40 MG capsule Commonly known as:  PRILOSEC TAKE 1 CAPSULE TWICE A DAY What changed:    how much to take  how to take this  when to take this   OXYGEN Inhale 2 L into the lungs continuous.   potassium chloride 10 MEQ tablet Commonly known as:  K-DUR Take 1 tablet (10 mEq total) by mouth daily. What changed:    how much to take  when to take this  additional instructions   PRESCRIPTION MEDICATION Inhale into the lungs at bedtime. CPAP   SPIRIVA HANDIHALER 18 MCG inhalation capsule Generic drug:  tiotropium INHALE THE CONTENTS OF 1 CAPSULE DAILY   SYMBICORT 160-4.5 MCG/ACT inhaler Generic drug:  budesonide-formoterol USE 2 INHALATIONS TWICE A DAY   Vitamin D 2000 units tablet Take 4,000 Units by mouth daily.      Contact information for after-discharge care    Destination    [facility] .   Service:  Skilled Nursing Contact information: [address] [place] [phone]  Allergies  Allergen Reactions  . Ace Inhibitors Cough  . Beta Adrenergic Blockers Other (See Comments)    bradycardia  . Decadron [Dexamethasone]     unknown  . Fluticasone-Salmeterol     Doesn't remember  . Hytrin [Terazosin]     Nasal congestion   . Prednisone     Shakes / tremors  . Vioxx [Rofecoxib] Other (See Comments)    dyspepsia    Consultations:  Orthopedic Surgery  Procedures/Studies: Dg Chest 2 View  Result Date: 10/25/2017 CLINICAL DATA:  84 year old male with history of weakness. EXAM: CHEST - 2 VIEW COMPARISON:  Chest x-ray 04/07/2017. FINDINGS: In volumes are low. No consolidative  airspace disease. No pleural effusions. Cephalization of the pulmonary vasculature, without frank pulmonary edema. Enlargement of the cardiopericardial silhouette, which could reflect underlying cardiomegaly and/or the presence of a pericardial effusion. Upper mediastinal contours are within normal limits. Aortic atherosclerosis. IMPRESSION: 1. Enlarged cardiopericardial silhouette, which could reflect cardiomegaly and/or presence of pericardial effusion. If, further evaluation there is clinical concern for pericardial effusion with echocardiography is suggested. 2. Pulmonary venous congestion, without frank pulmonary edema. 3. Aortic atherosclerosis. Electronically Signed   By: Vinnie Langton [name]   On: 10/25/2017 13:17   Ct Head Wo Contrast  Result Date: 10/26/2017 CLINICAL DATA:  Leg weakness for 4 days. EXAM: CT HEAD WITHOUT CONTRAST TECHNIQUE: Contiguous axial images were obtained from the base of the skull through the vertex without intravenous contrast. COMPARISON:  11/11/2013 FINDINGS: Brain: Chronic moderate small vessel ischemic disease of periventricular white matter. Age appropriate involutional changes of the brain. No hydrocephalus. No effacement of the basal cisterns. Cerebellum and brainstem are stable and intact. No intra-axial mass nor extra-axial fluid collections. No large vascular territory infarct. Chronic small lacunar infarct involving the anterior limb of the left internal capsule as before. Vascular: No hyperdense vessel or unexpected calcification. Skull: Negative for fracture or focal lesion. Sinuses/Orbits: Status post bilateral maxillary antrectomies. Minimal ethmoid and sphenoid sinus mucosal thickening. Intact orbits and globes. Other: None IMPRESSION: 1. Chronic microvascular ischemic disease and old left lacunar infarct of the anterior limb of the internal capsule. No acute intracranial abnormality. 2. Status post bilateral maxillary antrectomy. Electronically Signed   By:  Ashley Royalty [name]   On: 10/26/2017 19:53   Mr Lumbar Spine Wo Contrast  Result Date: 10/26/2017 CLINICAL DATA:  Extreme low back pain. EXAM: MRI LUMBAR SPINE WITHOUT CONTRAST TECHNIQUE: Multiplanar, multisequence MR imaging of the lumbar spine was performed. No intravenous contrast was administered. COMPARISON:  MRI lumbar spine 08/05/2010. FINDINGS: There is extreme motion degradation. Study is marginally diagnostic. Segmentation: Standard. Alignment: Exaggerated lumbar lordosis. 2 mm anterolisthesis L5-S1. 2 mm compensatory retrolisthesis L3-4 L2-3 and trace L1-2. Vertebrae: No worrisome osseous lesion. Endplate reactive changes related to disc space narrowing. Conus medullaris and cauda equina: Conus extends to the L1. level. Conus and cauda equina appear normal. Paraspinal and other soft tissues: Renal cystic disease, poorly visualized. Disc levels: L1-L2: Disc space narrowing. Central protrusion. Facet arthropathy. No definite impingement. L2-L3: 2 mm retrolisthesis. Central protrusion. Posterior element hypertrophy. Moderate stenosis. BILATERAL L2 and L3 nerve root impingement likely. L3-L4: Disc space narrowing. Central protrusion. Posterior element hypertrophy. Moderate to severe stenosis. BILATERAL L3 and L4 nerve root impingement. L4-L5: Disc space narrowing. Central protrusion. Posterior element hypertrophy. Severe stenosis. BILATERAL L4 and L5 nerve root impingement. L5-S1: 2 mm anterolisthesis. Annular bulge. Posterior element hypertrophy. No definite impingement. IMPRESSION: Marginally diagnostic study demonstrating moderate to severe stenosis throughout the L2-3 through  L4-5 disc spaces related to subluxations, disc space narrowing, disc protrusions, and posterior element hypertrophy. Symptomatic neural impingement could occur at any or all of those levels. See discussion above. Suspected progression since previous MR from 2012. Electronically Signed   By: Staci Righter [name]   On: 10/26/2017 20:45    US Renal  Result Date: 11/09/2017 CLINICAL DATA:  Initial evaluation for urinary tract infection. EXAM: RENAL / URINARY TRACT ULTRASOUND COMPLETE COMPARISON:  None. FINDINGS: Right Kidney: Length: 10.8 cm. Echogenicity grossly within normal limits. No mass lesion. No nephrolithiasis or hydronephrosis. Left Kidney: Length: 11.7 cm. Echogenicity grossly within normal limits. 4.1 x 4.4 x 4.8 cm exophytic cyst present at the lower pole of the left kidney. No associated vascularity. No hydronephrosis or nephrolithiasis. Bladder: Appears normal for degree of bladder distention. IMPRESSION: 1. No acute abnormality.  No hydronephrosis. 2. 4.8 cm exophytic left renal cyst. Electronically Signed   By: Jeannine Boga [name]   On: 11/09/2017 21:51   Dg Chest Port 1 View  Result Date: 11/11/2017 CLINICAL DATA:  Congestive failure EXAM: PORTABLE CHEST 1 VIEW COMPARISON:  11/08/2017 FINDINGS: Cardiac shadow is enlarged but stable. Aortic calcifications are again seen. Fullness in the right paratracheal region is again noted and stable. This is related to vascularity and the right thyroid lobe as seen on prior CT examination from 2015. Small left pleural effusion is noted. No focal infiltrate is seen. IMPRESSION: Stable small pleural effusion on the left. No new focal abnormality is seen. Electronically Signed   By: Inez Catalina [name]   On: 11/11/2017 15:50   Dg Chest Portable 1 View  Result Date: 11/08/2017 CLINICAL DATA:  Patient with worsening lethargy. EXAM: PORTABLE CHEST 1 VIEW COMPARISON:  Chest radiograph 10/28/2017. FINDINGS: Limited rotated exam. Monitoring leads overlie the patient. Stable cardiomegaly. Similar right paratracheal density. Thoracic aortic vascular calcifications. Low lung volumes. Bibasilar heterogeneous opacities. Mild interstitial opacities bilaterally. No definite pleural effusion or pneumothorax. IMPRESSION: Cardiomegaly. Interstitial opacities bilaterally may represent mild edema.  Basilar atelectasis. Electronically Signed   By: Lovey Newcomer [name]   On: 11/08/2017 16:22   Dg Chest Port 1 View  Result Date: 10/28/2017 CLINICAL DATA:  Monoclonal gammopathy of unknown significance. History of CHF, COPD, atrial fibrillation, on home oxygen. EXAM: PORTABLE CHEST 1 VIEW COMPARISON:  Chest x-ray of Oct 25, 2017 FINDINGS: The lungs are well-expanded. There is stable soft tissue fullness in the right paratracheal region. The cardiac silhouette remains enlarged. The pulmonary vascularity is not engorged. There is calcification in the wall of the aortic arch. There degenerative changes of both shoulders. IMPRESSION: Resolution of pulmonary interstitial edema. Stable cardiac enlargement. No pulmonary vascular congestion. COPD. No acute pneumonia. Stable soft tissue fullness in the right paratracheal region which reflects prominent vascular structures on a CT scan in May of 2015. Thoracic aortic atherosclerosis. Electronically Signed   By: David  Martinique [name]   On: 10/28/2017 11:05   Dg Swallowing Func-speech Pathology  Result Date: 10/27/2017 Objective Swallowing Evaluation: Type of Study: MBS-Modified Barium Swallow Study  Patient Details Name: ROLLIN KOTOWSKI MRN: [id number] Date of Birth: Sep 24, 1930 Today's Date: 10/27/2017 Time: SLP Start Time (ACUTE ONLY): 1425 -SLP Stop Time (ACUTE ONLY): 1500 SLP Time Calculation (min) (ACUTE ONLY): 35 min Past Medical History: Past Medical History: Diagnosis Date . Acute bronchitis  . Adenomatous colon polyp  . Allergic rhinitis  . Anemia   iron deficient . Atrial fibrillation ([place])  . Atrial flutter ([place])  . Chronic gastritis  .  COPD (chronic obstructive pulmonary disease) ([place])  . Diastolic dysfunction  . Diverticulosis of colon   recurrent GI bleeding . DJD (degenerative joint disease)  . GERD (gastroesophageal reflux disease)  . GI bleed  . HTN (hypertension)  . Hyperplastic colon polyp 2003 . Hypothyroidism  . Moderate aortic insufficiency  . OSA (obstructive  sleep apnea)   compliants w/ CPAP . Positive PPD   remote . Shortness of breath  . Venous insufficiency  Past Surgical History: Past Surgical History: Procedure Laterality Date . CARDIAC CATHETERIZATION  7.27.05  revealed a preserved EF with no CAD . COLONOSCOPY   . HERNIA REPAIR Bilateral  . JOINT REPLACEMENT  2001  lt total knee-rt one done 00 . NASAL SINUS SURGERY   . POLYPECTOMY    adenomatous polyps 2010 . SHOULDER ARTHROSCOPY   . TOE SURGERY Right  . TONSILLECTOMY   . TOTAL KNEE ARTHROPLASTY    bilateral HPI: ISIAHA GREENUP is a 84 y.o. male with medical history significant of HTN, A. fib, COPD on 2 L of nasal cannula oxygen, CHF, hypothyroidism, MGUS, OSA on CPAP, and venous stasis; who presents with complaints of inability to ambulate.  At baseline patient uses a walker to ambulate.  2 days prior to admit pt have problems standing and fell back into a chair.   Pt unable to care for himself at home and his primary care provider advised him to come back into the hospital for further treatment.  He he has these chronic venous stasis ulcerations.  Patient also report to md having a 60 pound weight loss after changing his diet in the last 18 months, but for the last 6 months he reports that his weight is stable.  Other associated symptoms include increased shortness of breath and worsening tremor.  Patient denies any history of Parkinson's disease.  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  Family reports to RN pt coughs with intake - RN informed MD and swallow eval ordered.  Imaging study chest showed venous pulmonary congestion and possible pericardial effusion.  Pt found to have degenerative changes  C4-C5, C5-C6, C6-C7 on prior imaging study 2005.  SLP recommended to defer clinical eval and proceed with MBS.  Subjective: pt awake in chair Assessment / Plan / Recommendation CHL IP CLINICAL IMPRESSIONS 10/27/2017 Clinical Impression Patient presents with  mild pharyngoesophageal phase dysphagia without aspiration and only trace penetration of sequential swallows of thin.  Decreased timing and adequacy of laryngeal closure resulted in mild penetration with thin.  Pt observed to have retained secretions in pharynx that mixed with barium.  He was sensate to mild vallecular residuals and requested liquids to clear.   Decreased UES opening noted with resultant residuals  - ? prominent CP and mild backflow of liquids to distal pharynx without sensation..   Fortunately this was minimal and pt's lateral channels provided protection.    Suspect this backflow and pt's kyphosis (known h/o GERD) may contribute to pt's coughing over intake.  Of note, pt did NOT cough througout entire MBS - however after MBS, he did cough and expectorate viscous clear secretions.  Advised him to keep his cough/hock strong for airway protection.  Using live video, educated pt to findings/recommendations.  Will follow for dysphagia management.  SLP Visit Diagnosis Dysphagia, pharyngeal phase (R13.13);Dysphagia, pharyngoesophageal phase (R13.14) Attention and concentration deficit following -- Frontal lobe and executive function deficit following -- Impact on safety and function Moderate aspiration risk;Risk for inadequate  nutrition/hydration   CHL IP TREATMENT RECOMMENDATION 10/27/2017 Treatment Recommendations Therapy as outlined in treatment plan below   Prognosis 10/27/2017 Prognosis for Safe Diet Advancement Good Barriers to Reach Goals -- Barriers/Prognosis Comment -- CHL IP DIET RECOMMENDATION 10/27/2017 SLP Diet Recommendations Regular solids;Thin liquid Liquid Administration via Straw Medication Administration Whole meds with liquid Compensations Minimize environmental distractions;Slow rate;Small sips/bites;Other (Comment) Postural Changes Remain semi-upright after after feeds/meals (Comment);Seated upright at 90 degrees   CHL IP OTHER RECOMMENDATIONS 10/27/2017 Recommended Consults -- Oral Care  Recommendations Oral care BID Other Recommendations --   No flowsheet data found.  CHL IP FREQUENCY AND DURATION 10/27/2017 Speech Therapy Frequency (ACUTE ONLY) min 2x/week Treatment Duration 1 week      CHL IP ORAL PHASE 10/27/2017 Oral Phase Impaired Oral - Pudding Teaspoon -- Oral - Pudding Cup -- Oral - Honey Teaspoon -- Oral - Honey Cup -- Oral - Nectar Teaspoon -- Oral - Nectar Cup WFL Oral - Nectar Straw -- Oral - Thin Teaspoon -- Oral - Thin Cup WFL Oral - Thin Straw WFL Oral - Puree WFL Oral - Mech Soft WFL Oral - Regular -- Oral - Multi-Consistency -- Oral - Pill WFL Oral Phase - Comment --  CHL IP PHARYNGEAL PHASE 10/27/2017 Pharyngeal Phase Impaired Pharyngeal- Pudding Teaspoon -- Pharyngeal -- Pharyngeal- Pudding Cup -- Pharyngeal -- Pharyngeal- Honey Teaspoon -- Pharyngeal -- Pharyngeal- Honey Cup -- Pharyngeal -- Pharyngeal- Nectar Teaspoon -- Pharyngeal -- Pharyngeal- Nectar Cup Reduced tongue base retraction;Pharyngeal residue - valleculae Pharyngeal -- Pharyngeal- Nectar Straw -- Pharyngeal -- Pharyngeal- Thin Teaspoon -- Pharyngeal -- Pharyngeal- Thin Cup Pharyngeal residue - valleculae;Reduced tongue base retraction;Penetration/Aspiration during swallow;Reduced laryngeal elevation Pharyngeal Material enters airway, remains ABOVE vocal cords and not ejected out Pharyngeal- Thin Straw Reduced tongue base retraction;Pharyngeal residue - valleculae;Reduced laryngeal elevation;Reduced airway/laryngeal closure Pharyngeal -- Pharyngeal- Puree WFL Pharyngeal -- Pharyngeal- Mechanical Soft -- Pharyngeal -- Pharyngeal- Regular WFL Pharyngeal -- Pharyngeal- Multi-consistency -- Pharyngeal -- Pharyngeal- Pill WFL Pharyngeal -- Pharyngeal Comment --  CHL IP CERVICAL ESOPHAGEAL PHASE 10/27/2017 Cervical Esophageal Phase Impaired Pudding Teaspoon -- Pudding Cup -- Honey Teaspoon -- Honey Cup -- Nectar Teaspoon -- Nectar Cup -- Nectar Straw -- Thin Teaspoon -- Thin Cup Reduced cricopharyngeal relaxation;Prominent  cricopharyngeal segment Thin Straw Reduced cricopharyngeal relaxation;Prominent cricopharyngeal segment Puree Reduced cricopharyngeal relaxation;Prominent cricopharyngeal segment Mechanical Soft -- Regular Reduced cricopharyngeal relaxation;Prominent cricopharyngeal segment Multi-consistency -- Pill Reduced cricopharyngeal relaxation;Prominent cricopharyngeal segment Cervical Esophageal Comment barium tablet taken with thin easily transited though pharynx and esophagus, pt did appear to have minimal stasis at mid-esophagus without sensation, this eventually cleared independently No flowsheet data found. Janett Labella [place], [place] [facility] SLP [phone]               Subjective: Lorelee Cover to return to SNF  Discharge Exam: Vitals:   11/12/17 0906 11/12/17 1143  BP:  115/69  Pulse: 86 79  Resp: (!) 27 (!) 22  Temp:  97.6 F (36.4 C)  SpO2: 94% (!) 87%   Vitals:   11/12/17 0317 11/12/17 0734 11/12/17 0906 11/12/17 1143  BP: 106/68 (!) 97/58  115/69  Pulse: 63 85 86 79  Resp: (!) 21 16 (!) 27 (!) 22  Temp: 99.2 F (37.3 C) 97.8 F (36.6 C)  97.6 F (36.4 C)  TempSrc: Axillary Oral  Oral  SpO2: 93% 95% 94% (!) 87%  Weight: 106.4 kg (234 lb 9.1 oz)     Height:        General: Pt is alert,  awake, not in acute distress Cardiovascular: RRR, S1/S2 +, no rubs, no gallops Respiratory: CTA bilaterally, no wheezing, no rhonchi Abdominal: Soft, NT, ND, bowel sounds + Extremities: no edema, no cyanosis   The results of significant diagnostics from this hospitalization (including imaging, microbiology, ancillary and laboratory) are listed below for reference.     Microbiology: Recent Results (from the past 240 hour(s))  Urine culture     Status: Abnormal   Collection Time: 11/08/17  6:00 PM  Result Value Ref Range Status   Specimen Description URINE, RANDOM  Final   Special Requests   Final    NONE Performed at [facility], [address] [place], [place] [address]     Culture >=100,000 COLONIES/mL ENTEROBACTER CLOACAE (A)  Final   Report Status 11/10/2017 FINAL  Final   Organism ID, Bacteria ENTEROBACTER CLOACAE (A)  Final      Susceptibility   Enterobacter cloacae - MIC*    CEFAZOLIN >=64 RESISTANT Resistant     CEFTRIAXONE <=1 SENSITIVE Sensitive     CIPROFLOXACIN <=0.25 SENSITIVE Sensitive     GENTAMICIN <=1 SENSITIVE Sensitive     IMIPENEM <=0.25 SENSITIVE Sensitive     NITROFURANTOIN 64 INTERMEDIATE Intermediate     TRIMETH/SULFA <=20 SENSITIVE Sensitive     PIP/TAZO 16 SENSITIVE Sensitive     * >=100,000 COLONIES/mL ENTEROBACTER CLOACAE  MRSA PCR Screening     Status: None   Collection Time: 11/08/17 10:12 PM  Result Value Ref Range Status   MRSA by PCR NEGATIVE NEGATIVE Final    Comment:        The GeneXpert MRSA Assay (FDA approved for NASAL specimens only), is one component of a comprehensive MRSA colonization surveillance program. It is not intended to diagnose MRSA infection nor to guide or monitor treatment for MRSA infections. Performed at [facility], [place] [address], [place], [place] [address]      Labs: BNP (last 3 results) Recent Labs    10/25/17 1253 10/26/17 1821 11/11/17 1601  BNP 398.5* 413.3* 329.5*   Basic Metabolic Panel: Recent Labs  Lab 11/09/17 0536 11/10/17 0242 11/10/17 1351 11/11/17 0238 11/12/17 0239  NA 135 132* 131* 131* 132*  K 3.9 2.8* 3.1* 4.1 4.1  CL 89* 87* 88* 91* 90*  CO2 33* 34* 33* 30 33*  GLUCOSE 81 90 97 98 97  BUN 27* 22* 23* 18 14  CREATININE 0.77 0.74 0.74 0.62 0.65  CALCIUM 8.6* 8.2* 8.3* 8.5* 8.6*  MG 2.0  --   --   --   --   PHOS 2.4*  --   --   --   --    Liver Function Tests: No results for input(s): AST, ALT, ALKPHOS, BILITOT, PROT, ALBUMIN in the last 168 hours. No results for input(s): LIPASE, AMYLASE in the last 168 hours. No results for input(s): AMMONIA in the last 168 hours. CBC: Recent Labs  Lab 11/08/17 1531 11/09/17 0333 11/10/17 0242  11/11/17 0238 11/12/17 0239  WBC 18.4* 14.0* 13.2* 11.4* 11.2*  NEUTROABS 15.2*  --  9.9* 8.0*  --   HGB 15.1 14.3 13.7 13.7 12.9*  HCT 45.1 43.2 41.2 41.0 39.0  MCV 89.7 92.3 90.0 89.5 91.1  PLT 227 203 204 181 162   Cardiac Enzymes: No results for input(s): CKTOTAL, CKMB, CKMBINDEX, TROPONINI in the last 168 hours. BNP: Invalid input(s): POCBNP CBG: No results for input(s): GLUCAP in the last 168 hours. D-Dimer No results for input(s): DDIMER in the  last 72 hours. Hgb A1c No results for input(s): HGBA1C in the last 72 hours. Lipid Profile No results for input(s): CHOL, HDL, LDLCALC, TRIG, CHOLHDL, LDLDIRECT in the last 72 hours. Thyroid function studies No results for input(s): TSH, T4TOTAL, T3FREE, THYROIDAB in the last 72 hours.  Invalid input(s): FREET3 Anemia work up No results for input(s): VITAMINB12, FOLATE, FERRITIN, TIBC, IRON, RETICCTPCT in the last 72 hours. Urinalysis    Component Value Date/Time   COLORURINE YELLOW 11/08/2017 1903   APPEARANCEUR HAZY (A) 11/08/2017 1903   LABSPEC 1.010 11/08/2017 1903   PHURINE 7.0 11/08/2017 1903   GLUCOSEU NEGATIVE 11/08/2017 1903   HGBUR SMALL (A) 11/08/2017 1903   BILIRUBINUR NEGATIVE 11/08/2017 1903   KETONESUR NEGATIVE 11/08/2017 1903   PROTEINUR NEGATIVE 11/08/2017 1903   UROBILINOGEN 1.0 11/11/2013 0125   NITRITE NEGATIVE 11/08/2017 1903   LEUKOCYTESUR MODERATE (A) 11/08/2017 1903   Sepsis Labs Invalid input(s): PROCALCITONIN,  WBC,  LACTICIDVEN Microbiology Recent Results (from the past 240 hour(s))  Urine culture     Status: Abnormal   Collection Time: 11/08/17  6:00 PM  Result Value Ref Range Status   Specimen Description URINE, RANDOM  Final   Special Requests   Final    NONE Performed at [facility], [place] [address] [place], [place] [address]    Culture >=100,000 COLONIES/mL ENTEROBACTER CLOACAE (A)  Final   Report Status 11/10/2017 FINAL  Final   Organism ID, Bacteria ENTEROBACTER CLOACAE  (A)  Final      Susceptibility   Enterobacter cloacae - MIC*    CEFAZOLIN >=64 RESISTANT Resistant     CEFTRIAXONE <=1 SENSITIVE Sensitive     CIPROFLOXACIN <=0.25 SENSITIVE Sensitive     GENTAMICIN <=1 SENSITIVE Sensitive     IMIPENEM <=0.25 SENSITIVE Sensitive     NITROFURANTOIN 64 INTERMEDIATE Intermediate     TRIMETH/SULFA <=20 SENSITIVE Sensitive     PIP/TAZO 16 SENSITIVE Sensitive     * >=100,000 COLONIES/mL ENTEROBACTER CLOACAE  MRSA PCR Screening     Status: None   Collection Time: 11/08/17 10:12 PM  Result Value Ref Range Status   MRSA by PCR NEGATIVE NEGATIVE Final    Comment:        The GeneXpert MRSA Assay (FDA approved for NASAL specimens only), is one component of a comprehensive MRSA colonization surveillance program. It is not intended to diagnose MRSA infection nor to guide or monitor treatment for MRSA infections. Performed at [facility], [place] [address], [place], [place] [address]      SIGNED:   Marylu Lund, MD  [facility] 11/12/2017, 12:08 PM  If 7PM-7AM, please contact night-coverage [email or web address] Password [email or web address]

## 2017-11-13 ENCOUNTER — Emergency Department (HOSPITAL_COMMUNITY): Payer: Medicare Other

## 2017-11-13 ENCOUNTER — Inpatient Hospital Stay (HOSPITAL_COMMUNITY)
Admission: EM | Admit: 2017-11-13 | Discharge: 2017-11-24 | DRG: 378 | Disposition: A | Discharge status: Skilled Nursing Facility | Payer: Medicare Other | Attending: Family Medicine | Admitting: Family Medicine

## 2017-11-13 ENCOUNTER — Other Ambulatory Visit: Payer: Self-pay

## 2017-11-13 ENCOUNTER — Encounter (HOSPITAL_COMMUNITY): Payer: Self-pay | Admitting: Family Medicine

## 2017-11-13 DIAGNOSIS — R55 Syncope and collapse: Secondary | ICD-10-CM | POA: Diagnosis present

## 2017-11-13 DIAGNOSIS — E871 Hypo-osmolality and hyponatremia: Secondary | ICD-10-CM | POA: Diagnosis present

## 2017-11-13 DIAGNOSIS — Z96653 Presence of artificial knee joint, bilateral: Secondary | ICD-10-CM | POA: Diagnosis present

## 2017-11-13 DIAGNOSIS — Z801 Family history of malignant neoplasm of trachea, bronchus and lung: Secondary | ICD-10-CM

## 2017-11-13 DIAGNOSIS — I714 Abdominal aortic aneurysm, without rupture: Secondary | ICD-10-CM | POA: Diagnosis present

## 2017-11-13 DIAGNOSIS — I480 Paroxysmal atrial fibrillation: Secondary | ICD-10-CM | POA: Diagnosis present

## 2017-11-13 DIAGNOSIS — I5022 Chronic systolic (congestive) heart failure: Secondary | ICD-10-CM | POA: Diagnosis present

## 2017-11-13 DIAGNOSIS — J811 Chronic pulmonary edema: Secondary | ICD-10-CM | POA: Diagnosis not present

## 2017-11-13 DIAGNOSIS — M4802 Spinal stenosis, cervical region: Secondary | ICD-10-CM | POA: Diagnosis present

## 2017-11-13 DIAGNOSIS — Z833 Family history of diabetes mellitus: Secondary | ICD-10-CM

## 2017-11-13 DIAGNOSIS — L89301 Pressure ulcer of unspecified buttock, stage 1: Secondary | ICD-10-CM | POA: Diagnosis present

## 2017-11-13 DIAGNOSIS — Z7401 Bed confinement status: Secondary | ICD-10-CM | POA: Diagnosis not present

## 2017-11-13 DIAGNOSIS — R0902 Hypoxemia: Secondary | ICD-10-CM | POA: Diagnosis not present

## 2017-11-13 DIAGNOSIS — K2950 Unspecified chronic gastritis without bleeding: Secondary | ICD-10-CM | POA: Diagnosis present

## 2017-11-13 DIAGNOSIS — Z9181 History of falling: Secondary | ICD-10-CM

## 2017-11-13 DIAGNOSIS — R935 Abnormal findings on diagnostic imaging of other abdominal regions, including retroperitoneum: Secondary | ICD-10-CM

## 2017-11-13 DIAGNOSIS — G4733 Obstructive sleep apnea (adult) (pediatric): Secondary | ICD-10-CM | POA: Diagnosis not present

## 2017-11-13 DIAGNOSIS — D509 Iron deficiency anemia, unspecified: Secondary | ICD-10-CM | POA: Diagnosis present

## 2017-11-13 DIAGNOSIS — I499 Cardiac arrhythmia, unspecified: Secondary | ICD-10-CM | POA: Diagnosis not present

## 2017-11-13 DIAGNOSIS — K219 Gastro-esophageal reflux disease without esophagitis: Secondary | ICD-10-CM | POA: Diagnosis present

## 2017-11-13 DIAGNOSIS — I4891 Unspecified atrial fibrillation: Secondary | ICD-10-CM | POA: Diagnosis not present

## 2017-11-13 DIAGNOSIS — Y838 Other surgical procedures as the cause of abnormal reaction of the patient, or of later complication, without mention of misadventure at the time of the procedure: Secondary | ICD-10-CM | POA: Diagnosis present

## 2017-11-13 DIAGNOSIS — Z9989 Dependence on other enabling machines and devices: Secondary | ICD-10-CM

## 2017-11-13 DIAGNOSIS — K5731 Diverticulosis of large intestine without perforation or abscess with bleeding: Secondary | ICD-10-CM | POA: Diagnosis not present

## 2017-11-13 DIAGNOSIS — Z7951 Long term (current) use of inhaled steroids: Secondary | ICD-10-CM

## 2017-11-13 DIAGNOSIS — I493 Ventricular premature depolarization: Secondary | ICD-10-CM | POA: Diagnosis not present

## 2017-11-13 DIAGNOSIS — N3000 Acute cystitis without hematuria: Secondary | ICD-10-CM

## 2017-11-13 DIAGNOSIS — Z66 Do not resuscitate: Secondary | ICD-10-CM | POA: Diagnosis present

## 2017-11-13 DIAGNOSIS — I4510 Unspecified right bundle-branch block: Secondary | ICD-10-CM | POA: Diagnosis present

## 2017-11-13 DIAGNOSIS — E039 Hypothyroidism, unspecified: Secondary | ICD-10-CM | POA: Diagnosis present

## 2017-11-13 DIAGNOSIS — D62 Acute posthemorrhagic anemia: Secondary | ICD-10-CM | POA: Diagnosis not present

## 2017-11-13 DIAGNOSIS — K625 Hemorrhage of anus and rectum: Secondary | ICD-10-CM | POA: Diagnosis not present

## 2017-11-13 DIAGNOSIS — I2729 Other secondary pulmonary hypertension: Secondary | ICD-10-CM | POA: Diagnosis present

## 2017-11-13 DIAGNOSIS — Z79899 Other long term (current) drug therapy: Secondary | ICD-10-CM

## 2017-11-13 DIAGNOSIS — K922 Gastrointestinal hemorrhage, unspecified: Secondary | ICD-10-CM

## 2017-11-13 DIAGNOSIS — Z7982 Long term (current) use of aspirin: Secondary | ICD-10-CM

## 2017-11-13 DIAGNOSIS — Z886 Allergy status to analgesic agent status: Secondary | ICD-10-CM

## 2017-11-13 DIAGNOSIS — Z9981 Dependence on supplemental oxygen: Secondary | ICD-10-CM

## 2017-11-13 DIAGNOSIS — I498 Other specified cardiac arrhythmias: Secondary | ICD-10-CM

## 2017-11-13 DIAGNOSIS — E669 Obesity, unspecified: Secondary | ICD-10-CM | POA: Diagnosis present

## 2017-11-13 DIAGNOSIS — Z993 Dependence on wheelchair: Secondary | ICD-10-CM

## 2017-11-13 DIAGNOSIS — Z6834 Body mass index (BMI) 34.0-34.9, adult: Secondary | ICD-10-CM

## 2017-11-13 DIAGNOSIS — I10 Essential (primary) hypertension: Secondary | ICD-10-CM | POA: Diagnosis present

## 2017-11-13 DIAGNOSIS — R269 Unspecified abnormalities of gait and mobility: Secondary | ICD-10-CM | POA: Diagnosis not present

## 2017-11-13 DIAGNOSIS — T8189XD Other complications of procedures, not elsewhere classified, subsequent encounter: Secondary | ICD-10-CM

## 2017-11-13 DIAGNOSIS — L8990 Pressure ulcer of unspecified site, unspecified stage: Secondary | ICD-10-CM

## 2017-11-13 DIAGNOSIS — R748 Abnormal levels of other serum enzymes: Secondary | ICD-10-CM | POA: Diagnosis not present

## 2017-11-13 DIAGNOSIS — J9611 Chronic respiratory failure with hypoxia: Secondary | ICD-10-CM | POA: Diagnosis present

## 2017-11-13 DIAGNOSIS — I951 Orthostatic hypotension: Secondary | ICD-10-CM | POA: Diagnosis not present

## 2017-11-13 DIAGNOSIS — M75101 Unspecified rotator cuff tear or rupture of right shoulder, not specified as traumatic: Secondary | ICD-10-CM | POA: Diagnosis present

## 2017-11-13 DIAGNOSIS — I872 Venous insufficiency (chronic) (peripheral): Secondary | ICD-10-CM | POA: Diagnosis present

## 2017-11-13 DIAGNOSIS — G9009 Other idiopathic peripheral autonomic neuropathy: Secondary | ICD-10-CM | POA: Diagnosis present

## 2017-11-13 DIAGNOSIS — I352 Nonrheumatic aortic (valve) stenosis with insufficiency: Secondary | ICD-10-CM | POA: Diagnosis present

## 2017-11-13 DIAGNOSIS — J449 Chronic obstructive pulmonary disease, unspecified: Secondary | ICD-10-CM | POA: Diagnosis not present

## 2017-11-13 DIAGNOSIS — N390 Urinary tract infection, site not specified: Secondary | ICD-10-CM | POA: Diagnosis present

## 2017-11-13 DIAGNOSIS — B9689 Other specified bacterial agents as the cause of diseases classified elsewhere: Secondary | ICD-10-CM | POA: Diagnosis present

## 2017-11-13 DIAGNOSIS — E876 Hypokalemia: Secondary | ICD-10-CM | POA: Diagnosis present

## 2017-11-13 DIAGNOSIS — R778 Other specified abnormalities of plasma proteins: Secondary | ICD-10-CM | POA: Diagnosis present

## 2017-11-13 DIAGNOSIS — K921 Melena: Secondary | ICD-10-CM | POA: Diagnosis not present

## 2017-11-13 DIAGNOSIS — R41841 Cognitive communication deficit: Secondary | ICD-10-CM | POA: Diagnosis not present

## 2017-11-13 DIAGNOSIS — R609 Edema, unspecified: Secondary | ICD-10-CM | POA: Diagnosis not present

## 2017-11-13 DIAGNOSIS — M48061 Spinal stenosis, lumbar region without neurogenic claudication: Secondary | ICD-10-CM | POA: Diagnosis present

## 2017-11-13 DIAGNOSIS — R2681 Unsteadiness on feet: Secondary | ICD-10-CM | POA: Diagnosis not present

## 2017-11-13 DIAGNOSIS — S91131D Puncture wound without foreign body of right great toe without damage to nail, subsequent encounter: Secondary | ICD-10-CM

## 2017-11-13 DIAGNOSIS — Z9089 Acquired absence of other organs: Secondary | ICD-10-CM

## 2017-11-13 DIAGNOSIS — Z87891 Personal history of nicotine dependence: Secondary | ICD-10-CM

## 2017-11-13 DIAGNOSIS — R404 Transient alteration of awareness: Secondary | ICD-10-CM | POA: Diagnosis not present

## 2017-11-13 DIAGNOSIS — M2550 Pain in unspecified joint: Secondary | ICD-10-CM | POA: Diagnosis not present

## 2017-11-13 DIAGNOSIS — Z8249 Family history of ischemic heart disease and other diseases of the circulatory system: Secondary | ICD-10-CM

## 2017-11-13 DIAGNOSIS — M545 Low back pain: Secondary | ICD-10-CM | POA: Diagnosis not present

## 2017-11-13 DIAGNOSIS — Z888 Allergy status to other drugs, medicaments and biological substances status: Secondary | ICD-10-CM

## 2017-11-13 DIAGNOSIS — R531 Weakness: Secondary | ICD-10-CM | POA: Diagnosis not present

## 2017-11-13 DIAGNOSIS — M75102 Unspecified rotator cuff tear or rupture of left shoulder, not specified as traumatic: Secondary | ICD-10-CM | POA: Diagnosis present

## 2017-11-13 DIAGNOSIS — I110 Hypertensive heart disease with heart failure: Secondary | ICD-10-CM | POA: Diagnosis present

## 2017-11-13 DIAGNOSIS — R7989 Other specified abnormal findings of blood chemistry: Secondary | ICD-10-CM | POA: Diagnosis present

## 2017-11-13 DIAGNOSIS — M351 Other overlap syndromes: Secondary | ICD-10-CM | POA: Diagnosis present

## 2017-11-13 DIAGNOSIS — J90 Pleural effusion, not elsewhere classified: Secondary | ICD-10-CM | POA: Diagnosis not present

## 2017-11-13 DIAGNOSIS — Z79890 Hormone replacement therapy (postmenopausal): Secondary | ICD-10-CM

## 2017-11-13 DIAGNOSIS — I5023 Acute on chronic systolic (congestive) heart failure: Secondary | ICD-10-CM | POA: Diagnosis not present

## 2017-11-13 DIAGNOSIS — Z741 Need for assistance with personal care: Secondary | ICD-10-CM | POA: Diagnosis not present

## 2017-11-13 DIAGNOSIS — Z86010 Personal history of colonic polyps: Secondary | ICD-10-CM

## 2017-11-13 DIAGNOSIS — R278 Other lack of coordination: Secondary | ICD-10-CM | POA: Diagnosis not present

## 2017-11-13 DIAGNOSIS — M7989 Other specified soft tissue disorders: Secondary | ICD-10-CM | POA: Diagnosis not present

## 2017-11-13 DIAGNOSIS — R1314 Dysphagia, pharyngoesophageal phase: Secondary | ICD-10-CM | POA: Diagnosis not present

## 2017-11-13 DIAGNOSIS — M79609 Pain in unspecified limb: Secondary | ICD-10-CM | POA: Diagnosis not present

## 2017-11-13 LAB — BASIC METABOLIC PANEL
ANION GAP: 9 (ref 5–15)
BUN: 16 mg/dL (ref 6–20)
CALCIUM: 8.6 mg/dL — AB (ref 8.9–10.3)
CO2: 33 mmol/L — ABNORMAL HIGH (ref 22–32)
Chloride: 90 mmol/L — ABNORMAL LOW (ref 101–111)
Creatinine, Ser: 0.79 mg/dL (ref 0.61–1.24)
Glucose, Bld: 97 mg/dL (ref 65–99)
Potassium: 3.5 mmol/L (ref 3.5–5.1)
SODIUM: 132 mmol/L — AB (ref 135–145)

## 2017-11-13 LAB — I-STAT TROPONIN, ED: TROPONIN I, POC: 0.11 ng/mL — AB (ref 0.00–0.08)

## 2017-11-13 LAB — URINALYSIS, ROUTINE W REFLEX MICROSCOPIC
Bilirubin Urine: NEGATIVE
GLUCOSE, UA: NEGATIVE mg/dL
Hgb urine dipstick: NEGATIVE
Ketones, ur: NEGATIVE mg/dL
Leukocytes, UA: NEGATIVE
NITRITE: NEGATIVE
PROTEIN: NEGATIVE mg/dL
Specific Gravity, Urine: 1.013 (ref 1.005–1.030)
pH: 5 (ref 5.0–8.0)

## 2017-11-13 LAB — CBC
HCT: 40.9 % (ref 39.0–52.0)
HEMOGLOBIN: 13.5 g/dL (ref 13.0–17.0)
MCH: 29.9 pg (ref 26.0–34.0)
MCHC: 33 g/dL (ref 30.0–36.0)
MCV: 90.5 fL (ref 78.0–100.0)
Platelets: 163 10*3/uL (ref 150–400)
RBC: 4.52 MIL/uL (ref 4.22–5.81)
RDW: 13 % (ref 11.5–15.5)
WBC: 11.6 10*3/uL — AB (ref 4.0–10.5)

## 2017-11-13 LAB — TROPONIN I: TROPONIN I: 0.1 ng/mL — AB (ref <0.03)

## 2017-11-13 MED ORDER — VITAMIN D 1000 UNITS PO TABS
4000.0000 [IU] | ORAL_TABLET | Freq: Every day | ORAL | Status: DC
  Administered 2017-11-14 – 2017-11-24 (×10): 4000 [IU] via ORAL
  Filled 2017-11-13 (×10): qty 4

## 2017-11-13 MED ORDER — ALBUTEROL SULFATE (2.5 MG/3ML) 0.083% IN NEBU: 2.5000 mg | INHALATION_SOLUTION | RESPIRATORY_TRACT | Status: DC | PRN

## 2017-11-13 MED ORDER — SODIUM CHLORIDE 0.9% FLUSH
3.0000 mL | Freq: Two times a day (BID) | INTRAVENOUS | Status: DC
  Administered 2017-11-14 – 2017-11-24 (×20): 3 mL via INTRAVENOUS

## 2017-11-13 MED ORDER — HYDROCODONE-ACETAMINOPHEN 5-325 MG PO TABS
1.0000 | ORAL_TABLET | ORAL | Status: DC | PRN
  Administered 2017-11-14: 2 via ORAL
  Filled 2017-11-13: qty 2

## 2017-11-13 MED ORDER — LEVOTHYROXINE SODIUM 100 MCG PO TABS
100.0000 ug | ORAL_TABLET | Freq: Every day | ORAL | Status: DC
  Administered 2017-11-15 – 2017-11-24 (×9): 100 ug via ORAL
  Filled 2017-11-13 (×11): qty 1

## 2017-11-13 MED ORDER — CALCIUM CARBONATE ANTACID 500 MG PO CHEW
2.0000 | CHEWABLE_TABLET | Freq: Three times a day (TID) | ORAL | Status: DC | PRN
  Filled 2017-11-13: qty 2

## 2017-11-13 MED ORDER — SODIUM CHLORIDE 0.9 % IV BOLUS
500.0000 mL | Freq: Once | INTRAVENOUS | Status: AC
  Administered 2017-11-13: 500 mL via INTRAVENOUS

## 2017-11-13 MED ORDER — CEFDINIR 300 MG PO CAPS
300.0000 mg | ORAL_CAPSULE | Freq: Two times a day (BID) | ORAL | Status: AC
  Administered 2017-11-14 – 2017-11-15 (×4): 300 mg via ORAL
  Filled 2017-11-13 (×6): qty 1

## 2017-11-13 MED ORDER — BISACODYL 5 MG PO TBEC: 5.0000 mg | DELAYED_RELEASE_TABLET | Freq: Every day | ORAL | Status: DC | PRN

## 2017-11-13 MED ORDER — ACETAMINOPHEN 650 MG RE SUPP: 650.0000 mg | Freq: Four times a day (QID) | RECTAL | Status: DC | PRN

## 2017-11-13 MED ORDER — TIOTROPIUM BROMIDE MONOHYDRATE 18 MCG IN CAPS
1.0000 | ORAL_CAPSULE | Freq: Every day | RESPIRATORY_TRACT | Status: DC
  Administered 2017-11-14 – 2017-11-24 (×9): 18 ug via RESPIRATORY_TRACT
  Filled 2017-11-13 (×3): qty 5

## 2017-11-13 MED ORDER — ADULT MULTIVITAMIN W/MINERALS CH
1.0000 | ORAL_TABLET | Freq: Every day | ORAL | Status: DC
  Administered 2017-11-14 – 2017-11-24 (×10): 1 via ORAL
  Filled 2017-11-13 (×11): qty 1

## 2017-11-13 MED ORDER — B COMPLEX-C PO TABS
1.0000 | ORAL_TABLET | Freq: Every day | ORAL | Status: DC
  Administered 2017-11-14 – 2017-11-24 (×10): 1 via ORAL
  Filled 2017-11-13 (×12): qty 1

## 2017-11-13 MED ORDER — PANTOPRAZOLE SODIUM 40 MG PO TBEC
40.0000 mg | DELAYED_RELEASE_TABLET | Freq: Two times a day (BID) | ORAL | Status: DC
  Administered 2017-11-14 – 2017-11-18 (×10): 40 mg via ORAL
  Filled 2017-11-13 (×10): qty 1

## 2017-11-13 MED ORDER — MAGNESIUM SULFATE IN D5W 1-5 GM/100ML-% IV SOLN
1.0000 g | Freq: Once | INTRAVENOUS | Status: AC
  Administered 2017-11-13: 1 g via INTRAVENOUS
  Filled 2017-11-13: qty 100

## 2017-11-13 MED ORDER — DOCUSATE SODIUM 100 MG PO CAPS
100.0000 mg | ORAL_CAPSULE | Freq: Two times a day (BID) | ORAL | Status: DC
  Administered 2017-11-14 – 2017-11-20 (×13): 100 mg via ORAL
  Filled 2017-11-13 (×15): qty 1

## 2017-11-13 MED ORDER — MOMETASONE FURO-FORMOTEROL FUM 200-5 MCG/ACT IN AERO
2.0000 | INHALATION_SPRAY | Freq: Two times a day (BID) | RESPIRATORY_TRACT | Status: DC
  Administered 2017-11-14 – 2017-11-24 (×17): 2 via RESPIRATORY_TRACT
  Filled 2017-11-13 (×3): qty 8.8

## 2017-11-13 MED ORDER — ACETAMINOPHEN 325 MG PO TABS
650.0000 mg | ORAL_TABLET | Freq: Four times a day (QID) | ORAL | Status: DC | PRN
  Administered 2017-11-19: 650 mg via ORAL
  Filled 2017-11-13 (×2): qty 2

## 2017-11-13 MED ORDER — SENNOSIDES-DOCUSATE SODIUM 8.6-50 MG PO TABS
1.0000 | ORAL_TABLET | Freq: Every evening | ORAL | Status: DC | PRN
  Filled 2017-11-13: qty 1

## 2017-11-13 MED ORDER — POTASSIUM CHLORIDE IN NACL 20-0.9 MEQ/L-% IV SOLN
INTRAVENOUS | Status: AC
  Administered 2017-11-14: 01:00:00 via INTRAVENOUS
  Filled 2017-11-13: qty 1000

## 2017-11-13 MED ORDER — ENOXAPARIN SODIUM 40 MG/0.4ML ~~LOC~~ SOLN
40.0000 mg | Freq: Every day | SUBCUTANEOUS | Status: DC
  Administered 2017-11-14 – 2017-11-18 (×5): 40 mg via SUBCUTANEOUS
  Filled 2017-11-13 (×6): qty 0.4

## 2017-11-13 MED ORDER — ASPIRIN EC 81 MG PO TBEC
81.0000 mg | DELAYED_RELEASE_TABLET | ORAL | Status: DC
  Administered 2017-11-16 – 2017-11-23 (×4): 81 mg via ORAL
  Filled 2017-11-13 (×9): qty 1

## 2017-11-13 MED ORDER — POTASSIUM CHLORIDE CRYS ER 10 MEQ PO TBCR
10.0000 meq | EXTENDED_RELEASE_TABLET | Freq: Every day | ORAL | Status: DC
  Administered 2017-11-14 – 2017-11-24 (×10): 10 meq via ORAL
  Filled 2017-11-13 (×11): qty 1

## 2017-11-13 MED ORDER — FLEET ENEMA 7-19 GM/118ML RE ENEM
1.0000 | ENEMA | Freq: Once | RECTAL | Status: DC | PRN
  Filled 2017-11-13: qty 1

## 2017-11-13 NOTE — ED Notes (Signed)
ED Provider at bedside. 

## 2017-11-13 NOTE — ED Notes (Signed)
Pt unable to stand up for orthostatics.

## 2017-11-13 NOTE — H&P (Signed)
History and Physical    Richard Rosales YQI:[id number] DOB: 1931/04/12 DOA: 11/13/2017  PCP: Unk Pinto, MD   Patient coming from: SNF   Chief Complaint: Syncope   HPI: Richard Rosales is a 84 y.o. male with medical history significant for chronic systolic CHF, atrial fibrillation not on anticoagulation, hypothyroidism, COPD with chronic hypoxic respiratory failure, and UTI and antibiotics, now presenting for evaluation of syncope.  Patient was discharged from the hospital yesterday after management of the lethargy, hypokalemia, and UTI.  His condition was in part attributed to recent diuresis and the patient was hydrated while in the hospital.  He was treated with antibiotics and discharged back to his SNF yesterday to complete a course of [place].  He reports feeling well while at rest, but becomes acutely lightheaded while sitting up or trying to stand.  He had a syncopal episode today while trying to participate in physical therapy.  He denies any chest pain or palpitations and denies shortness of breath, though he is requiring increased supplemental oxygen.  ED Course: Upon arrival to the ED, patient is found to be afebrile, saturating well on 4 L/min supplemental oxygen, and with blood pressure of 81/47.  EKG features a wandering atrial pacemaker with PVCs and RBBB.  Chest x-ray is notable for cardiomegaly with pulmonary edema superimposed on emphysematous changes.  Chemistry panel features a mild hyponatremia and CBC is notable for leukocytosis to 11,600.  Troponin is elevated to 0.11.  Patient was given 500 cc normal saline in the ED.  He was too symptomatic to stand for orthostatic vital signs.  Blood pressure has improved, he continues to deny any chest pain or dyspnea, and will be admitted for ongoing evaluation and management of syncope.  Review of Systems:  All other systems reviewed and apart from HPI, are negative.  Past Medical History:  Diagnosis Date  . Acute bronchitis     . Adenomatous colon polyp   . Allergic rhinitis   . Anemia    iron deficient  . Atrial fibrillation ([place])   . Atrial flutter ([place])   . Chronic gastritis   . COPD (chronic obstructive pulmonary disease) ([place])   . Diastolic dysfunction   . Diverticulosis of colon    recurrent GI bleeding  . DJD (degenerative joint disease)   . GERD (gastroesophageal reflux disease)   . GI bleed   . HTN (hypertension)   . Hyperplastic colon polyp 2003  . Hypothyroidism   . Moderate aortic insufficiency   . OSA (obstructive sleep apnea)    compliants w/ CPAP  . Positive PPD    remote  . Shortness of breath   . Venous insufficiency     Past Surgical History:  Procedure Laterality Date  . CARDIAC CATHETERIZATION  7.27.05   revealed a preserved EF with no CAD  . COLONOSCOPY    . HERNIA REPAIR Bilateral   . JOINT REPLACEMENT  2001   lt total knee-rt one done 00  . NASAL SINUS SURGERY    . POLYPECTOMY     adenomatous polyps 2010  . SHOULDER ARTHROSCOPY    . TOE SURGERY Right   . TONSILLECTOMY    . TOTAL KNEE ARTHROPLASTY     bilateral     reports that he quit smoking about 39 years ago. His smoking use included cigarettes. He has a 20.00 pack-year smoking history. He has never used smokeless tobacco. He reports that he drinks alcohol. He reports that he does not use drugs.  Allergies  Allergen Reactions  . Other Other (See Comments)    No blood thinners; they caused him to [place]  . Ace Inhibitors Cough  . Beta Adrenergic Blockers Other (See Comments)    Bradycardia   . Decadron [Dexamethasone] Other (See Comments)    Becomes very "mean" if taking this  . Fluticasone-Salmeterol Other (See Comments)    Doesn't remember the reaction  . Hytrin [Terazosin] Other (See Comments)    Nasal congestion   . Prednisone     Shakes / tremors  . Vioxx [Rofecoxib] Other (See Comments)    Dyspepsia     Family History  Problem Relation Age of Onset  . Hypertension Mother   .  Diabetes Father   . Heart failure Father   . Arrhythmia Sister 27  . Lung cancer Brother        chemical     Prior to Admission medications   Medication Sig Start Date End Date Taking? Authorizing Provider  albuterol (PROVENTIL HFA;VENTOLIN HFA) 108 (90 Base) MCG/ACT inhaler Inhale 2 puffs into the lungs every 6 (six) hours as needed for wheezing or shortness of breath. 08/21/15  Yes Collene Gobble, MD  albuterol (PROVENTIL) (2.5 MG/3ML) 0.083% nebulizer solution Take 3 mLs (2.5 mg total) by nebulization every 4 (four) hours as needed for wheezing or shortness of breath. 10/31/17  Yes Hosie Poisson, MD  aspirin EC 81 MG tablet Take 81 mg by mouth every Monday, Wednesday, and Friday. 11/16/17  Yes [provider]  azelastine (ASTELIN) 0.1 % nasal spray USE 1 SPRAY IN EACH NOSTRIL TWICE A DAY 07/29/17  Yes Parrett, Tammy S, NP  b complex vitamins capsule Take 1 capsule by mouth daily.   Yes [provider]  calcium carbonate (TUMS - DOSED IN MG ELEMENTAL CALCIUM) 500 MG chewable tablet Chew 2 tablets by mouth 3 (three) times daily as needed for indigestion.   Yes [provider]  carvedilol (COREG) 6.25 MG tablet Take 1 tablet (6.25 mg total) by mouth 2 (two) times daily with a meal. 10/31/17  Yes Hosie Poisson, MD  cefdinir ([name]) 300 MG capsule Take 1 capsule (300 mg total) by mouth every 12 (twelve) hours. Patient taking differently: Take 300 mg by mouth See admin instructions. Take 300 mg by mouth two times a day for 3 days 11/12/17  Yes Donne Hazel, MD  Cholecalciferol (VITAMIN D) 2000 units tablet Take 4,000 Units by mouth daily.   Yes [provider]  docusate sodium (COLACE) 100 MG capsule Take 100 mg by mouth 2 (two) times daily.     Yes [provider]  furosemide (LASIX) 40 MG tablet Take 40 mg by mouth 2 (two) times daily.   Yes [provider]  GLUCOSAMINE-CHONDROITIN PO Take 1 capsule by mouth daily.   Yes [provider]  HYDROcodone-acetaminophen (NORCO/VICODIN) 5-325 MG tablet Take 1-2 tablets by mouth every 4 (four) hours as needed for moderate pain or severe pain.    Yes [provider]  levothyroxine (SYNTHROID, LEVOTHROID) 100 MCG tablet TAKE 1 TABLET DAILY BEFORE BREAKFAST 06/17/17  Yes Liane Comber, NP  Multiple Vitamin (MULTIVITAMIN WITH MINERALS) TABS tablet Take 1 tablet by mouth daily.   Yes [provider]  Omega-3 Fatty Acids (FISH OIL) 1000 MG CAPS Take 1,000 mg by mouth 2 (two) times daily.   Yes [provider]  omeprazole (PRILOSEC) 40 MG capsule TAKE 1 CAPSULE TWICE A DAY Patient taking differently: Take 40 mg by mouth two  times a day 06/17/17  Yes Corbett, Caryl Pina, NP  OXYGEN Inhale 2 L into the lungs continuous.   Yes [provider]  potassium chloride (K-DUR) 10 MEQ tablet Take 1 tablet (10 mEq total) by mouth daily. 04/09/17  Yes Parrett, Fonnie Mu, NP  SPIRIVA HANDIHALER 18 MCG inhalation capsule INHALE THE CONTENTS OF 1 CAPSULE DAILY 08/18/17  Yes Collene Gobble, MD  SYMBICORT 160-4.5 MCG/ACT inhaler USE 2 INHALATIONS TWICE A DAY 10/26/17  Yes Byrum, Rose Fillers, MD  UNABLE TO FIND CPAP: At bedtime   Yes [provider]  ammonium lactate (LAC-HYDRIN) 12 % lotion Apply topically every morning. Patient not taking: Reported on 11/13/2017 10/31/17   Hosie Poisson, MD  bisacodyl (DULCOLAX) 10 MG suppository Place 10 mg rectally once as needed (if no BM in 8 hours after MOM administered).    [provider]  Magnesium Hydroxide (MILK OF MAGNESIA PO) Take 30 mLs by mouth once as needed (if no BM in 3 days).    [provider]  Sodium Phosphates (FLEET ENEMA RE) Place 1 each rectally once as needed (if no BM within 2 hours after dulcolax suppository).    [provider]    Physical Exam: Vitals:   11/13/17 1900 11/13/17 1915 11/13/17 1930 11/13/17 1952  BP: (!) 104/49 108/61 111/62 106/63  Pulse: (!) 40  (!) 43 (!) 45  Resp:  18 18 (!) 21 (!) 23  Temp:      TempSrc:      SpO2: 94%  94% 92%  Weight:      Height:          Constitutional: NAD, calm  Eyes: PERTLA, lids and conjunctivae normal ENMT: Mucous membranes are moist. Posterior pharynx clear of any exudate or lesions.   Neck: normal, supple, no masses, no thyromegaly Respiratory: Rales bilaterally. No accessory muscle use.  Cardiovascular: Rate ~80 and irregular. Pretibial edema bilaterally. Abdomen: No distension, no tenderness, soft. Bowel sounds normal.  Musculoskeletal: no clubbing / cyanosis. No joint deformity upper and lower extremities.    Skin: Hyperpigmentation and superficial ulcerations with crust at bilateral lower legs in gaiter distribution. Warm, dry, well-perfused. Neurologic: CN 2-12 grossly intact. Sensation intact. Moving all extremities.  Psychiatric: Alert and oriented x 3. Calm, cooperative.     Labs on Admission: I have personally reviewed following labs and imaging studies  CBC: Recent Labs  Lab 11/08/17 1531 11/09/17 0333 11/10/17 0242 11/11/17 0238 11/12/17 0239 11/13/17 1532  WBC 18.4* 14.0* 13.2* 11.4* 11.2* 11.6*  NEUTROABS 15.2*  --  9.9* 8.0*  --   --   HGB 15.1 14.3 13.7 13.7 12.9* 13.5  HCT 45.1 43.2 41.2 41.0 39.0 40.9  MCV 89.7 92.3 90.0 89.5 91.1 90.5  PLT 227 203 204 181 162 371   Basic Metabolic Panel: Recent Labs  Lab 11/09/17 0536 11/10/17 0242 11/10/17 1351 11/11/17 0238 11/12/17 0239 11/13/17 1532  NA 135 132* 131* 131* 132* 132*  K 3.9 2.8* 3.1* 4.1 4.1 3.5  CL 89* 87* 88* 91* 90* 90*  CO2 33* 34* 33* 30 33* 33*  GLUCOSE 81 90 97 98 97 97  BUN 27* 22* 23* 18 14 16   CREATININE 0.77 0.74 0.74 0.62 0.65 0.79  CALCIUM 8.6* 8.2* 8.3* 8.5* 8.6* 8.6*  MG 2.0  --   --   --   --   --   PHOS 2.4*  --   --   --   --   --  GFR: Estimated Creatinine Clearance: 81.7 mL/min (by C-G formula based on SCr of 0.79 mg/dL). Liver Function Tests: No results for input(s): AST, ALT, ALKPHOS,  BILITOT, PROT, ALBUMIN in the last 168 hours. No results for input(s): LIPASE, AMYLASE in the last 168 hours. No results for input(s): AMMONIA in the last 168 hours. Coagulation Profile: No results for input(s): INR, PROTIME in the last 168 hours. Cardiac Enzymes: No results for input(s): CKTOTAL, CKMB, CKMBINDEX, TROPONINI in the last 168 hours. BNP (last 3 results) Recent Labs    04/07/17 1632  PROBNP 230.0*   HbA1C: No results for input(s): HGBA1C in the last 72 hours. CBG: No results for input(s): GLUCAP in the last 168 hours. Lipid Profile: No results for input(s): CHOL, HDL, LDLCALC, TRIG, CHOLHDL, LDLDIRECT in the last 72 hours. Thyroid Function Tests: No results for input(s): TSH, T4TOTAL, FREET4, T3FREE, THYROIDAB in the last 72 hours. Anemia Panel: No results for input(s): VITAMINB12, FOLATE, FERRITIN, TIBC, IRON, RETICCTPCT in the last 72 hours. Urine analysis:    Component Value Date/Time   COLORURINE YELLOW 11/13/2017 1530   APPEARANCEUR HAZY (A) 11/13/2017 1530   LABSPEC 1.013 11/13/2017 1530   PHURINE 5.0 11/13/2017 1530   GLUCOSEU NEGATIVE 11/13/2017 1530   HGBUR NEGATIVE 11/13/2017 [place] 11/13/2017 1530   KETONESUR NEGATIVE 11/13/2017 1530   PROTEINUR NEGATIVE 11/13/2017 1530   UROBILINOGEN 1.0 11/11/2013 0125   NITRITE NEGATIVE 11/13/2017 1530   LEUKOCYTESUR NEGATIVE 11/13/2017 1530   Sepsis Labs: @LABRCNTIP (procalcitonin:4,lacticidven:4) ) Recent Results (from the past 240 hour(s))  Urine culture     Status: Abnormal   Collection Time: 11/08/17  6:00 PM  Result Value Ref Range Status   Specimen Description URINE, RANDOM  Final   Special Requests   Final    NONE Performed at [facility], [place] [address], [place], [place] [address]    Culture >=100,000 COLONIES/mL ENTEROBACTER CLOACAE (A)  Final   Report Status 11/10/2017 FINAL  Final   Organism ID, Bacteria ENTEROBACTER CLOACAE (A)  Final      Susceptibility    Enterobacter cloacae - MIC*    CEFAZOLIN >=64 RESISTANT Resistant     CEFTRIAXONE <=1 SENSITIVE Sensitive     CIPROFLOXACIN <=0.25 SENSITIVE Sensitive     GENTAMICIN <=1 SENSITIVE Sensitive     IMIPENEM <=0.25 SENSITIVE Sensitive     NITROFURANTOIN 64 INTERMEDIATE Intermediate     TRIMETH/SULFA <=20 SENSITIVE Sensitive     PIP/TAZO 16 SENSITIVE Sensitive     * >=100,000 COLONIES/mL ENTEROBACTER CLOACAE  MRSA PCR Screening     Status: None   Collection Time: 11/08/17 10:12 PM  Result Value Ref Range Status   MRSA by PCR NEGATIVE NEGATIVE Final    Comment:        The GeneXpert MRSA Assay (FDA approved for NASAL specimens only), is one component of a comprehensive MRSA colonization surveillance program. It is not intended to diagnose MRSA infection nor to guide or monitor treatment for MRSA infections. Performed at [facility], [place] [address] [place], [place] [address]      Radiological Exams on Admission: Dg Chest Port 1 View  Result Date: 11/13/2017 CLINICAL DATA:  Bradycardia.  Former smoker. EXAM: PORTABLE CHEST 1 VIEW COMPARISON:  11/11/2017; 10/25/2017; 04/07/2017 FINDINGS: Grossly unchanged enlarged cardiac silhouette and mediastinal contours with atherosclerotic plaque within thoracic aorta. Grossly unchanged small bilateral effusions and associated bibasilar heterogeneous/consolidative opacities, left greater than right. No new focal airspace opacities. Re demonstrated thinning the  biapical pulmonary parenchyma however the pulmonary vasculature appears indistinct within mid and lower lungs. No definite pneumothorax, though note, evaluation degraded secondary to patient's overlying chin. No acute osseus abnormalities. IMPRESSION: Similar findings of cardiomegaly and pulmonary edema superimposed on advanced emphysematous change with small bilateral effusions and associated bibasilar opacities, left greater than right, atelectasis versus infiltrate. Electronically Signed    By: Sandi Mariscal [name]   On: 11/13/2017 15:59    EKG: Independently reviewed. Wandering atrial pacemaker, PVC's, RBBB.   Assessment/Plan  1. Syncope  - Presents from SNF after a syncopal episode  - He was just discharged the day before and has reportedly been orthostatic with reported 50 mmHg drop in SBP on standing at his SNF and he had a syncopal episode while during PT  - Patient denies chest pain, palpitations, or any other complaints  - In an atrial arrhythmia in ED  - Was too symptomatic on standing to complete orthostatic vitals  - Given 500 cc NS in ED  - Continue a very gentle IVF hydration mindful of CHF and pulm edema, hold diuretics, hold antihypertensives, repeat orthostatics in am  - Echo done earlier this month with EF 40-45%, diffuse HK, severe LAE, mild AI, mild AS, and mild pulm HTN   2. Chronic systolic CHF  - There is pulmonary edema on CXR with slight increase in his supplemental O2 requirement, but no respiratory distress  - Lasix held in light of orthostasis with syncope and low BP's  - He is being gently hydrated  - Follow daily wt and I/O's, hold Lasix and Coreg in light of hypotensive episodes  - Echo done earlier this month with EF 40-45%, diffuse HK, severe LAE, mild AI, mild AS, and mild pulm HTN    3. COPD with chronic hypoxic respiratory failure  - No wheezing on admission, has increased O2-requirement but denies SOB or cough  - Continue ICS/LABA, Spiriva, and prn albuterol  - Continue supplemental O2   4. Atrial fibrillation  - In an atrial arrhythmia on admission  - CHADS-VASc at least 3 (age x2, CHF)  - Follows with cardiology and has declined anticoagulation  - Beta-blocker held on admission in light of low blood pressures    5. Hypothyroidism  - Thyroid studies normal earlier this month  - Continue Synthroid    6. UTI  - Treated with Rocephin during recent hospitalization and transitioned to [facility] on discharge  - No fever on admission  -  Continue [name] to complete the course    7. Elevated troponin  - Troponin is elevated to 0.11 in ED  - Patient denies chest pain or SOB  - Continue cardiac monitoring, trend troponin, hold Coreg in light of hypotension    DVT prophylaxis: Lovenox  Code Status: DNR  Family Communication: Daughter and wife updated at bedside Consults called: None Admission status: Inpatient    Vianne Bulls, MD [facility] Pager [phone]  If 7PM-7AM, please contact night-coverage [email or web address] Password [facility]  11/13/2017, 8:39 PM

## 2017-11-13 NOTE — ED Provider Notes (Signed)
[facility] EMERGENCY DEPARTMENT Provider Note   CSN: [id number] Arrival date & time: 11/13/17  1501     History   Chief Complaint Chief Complaint  Patient presents with  . Bradycardia    HPI Richard Rosales is a 84 y.o. male.  Pt presents to the ED today with bradycardia.  The pt has been admitted recently (5/19-23) for weakness and confusion.  The pt was d/c back to the SNF and said he's been feeling fine.  EMS said SNF told them he was bradycardic (no numbers given).  Pt was not bradycardic en route.  While in the hospital, he was treated for an uti due to enterobacter (present on admission) from an indwelling foley.  Pt was also hypokalemic and weak due to dehydration and deconditioning.  Pt does have a.fib, but is not on anticoagulation as he has had problems with internal bleeding.  CHA2DS2/VAS Stroke Risk Points  Current as of 12 minutes ago     4 >= 2 Points: High Risk  1 - 1.99 Points: Medium Risk  0 Points: Low Risk    This is the only CHA2DS2/VAS Stroke Risk Points available for the past  year.:  Last Change: N/A     Details    This score determines the patient's risk of having a stroke if the  patient has atrial fibrillation.       Points Metrics  1 Has Congestive Heart Failure:  Yes    Current as of 12 minutes ago  0 Has Vascular Disease:  No    Current as of 12 minutes ago  1 Has Hypertension:  Yes    Current as of 12 minutes ago  2 Age:  79    Current as of 12 minutes ago  0 Has Diabetes:  No    Current as of 12 minutes ago  0 Had Stroke:  No  Had TIA:  No  Had thromboembolism:  No    Current as of 12 minutes ago  0 Male:  No    Current as of 12 minutes ago     Family now here and gives a little more hx.  Pt became unresponsive with standing and passed out while at the SNF.  His family said his medications have been changed around a lot in the last few admissions.  His lasix was held during this last admission, then it was  restarted.         Past Medical History:  Diagnosis Date  . Acute bronchitis   . Adenomatous colon polyp   . Allergic rhinitis   . Anemia    iron deficient  . Atrial fibrillation ([place])   . Atrial flutter ([place])   . Chronic gastritis   . COPD (chronic obstructive pulmonary disease) ([place])   . Diastolic dysfunction   . Diverticulosis of colon    recurrent GI bleeding  . DJD (degenerative joint disease)   . GERD (gastroesophageal reflux disease)   . GI bleed   . HTN (hypertension)   . Hyperplastic colon polyp 2003  . Hypothyroidism   . Moderate aortic insufficiency   . OSA (obstructive sleep apnea)    compliants w/ CPAP  . Positive PPD    remote  . Shortness of breath   . Venous insufficiency     Patient Active Problem List   Diagnosis Date Noted  . Acute on chronic systolic CHF (congestive heart failure) ([place]) 11/13/2017  . Syncope 11/13/2017  . Elevated troponin 11/13/2017  .  A-fib ([place]) 11/08/2017  . Weakness 11/08/2017  . Spinal stenosis of lumbar region at multiple levels 10/26/2017  . Non-healing wound of lower extremity 10/22/2017  . Venous ulcers of both lower extremities ([place]) 08/12/2017  . Physical deconditioning 02/11/2017  . Essential tremor 12/04/2016  . MGUS (monoclonal gammopathy of unknown significance) 11/11/2016  . 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 Polyneuropathy 10/01/2016  . Gait disturbance 07/31/2016  . Aortic atherosclerosis ([place]) 06/02/2016  . Encounter for Medicare annual wellness exam 02/19/2015  . Hypertensive cardiovascular disease 03/06/2014  . Mixed hyperlipidemia 12/15/2013  . Prediabetes 12/15/2013  . Vitamin D deficiency 12/15/2013  . Medication management 12/15/2013  . DJD (degenerative joint disease)   . Allergic rhinitis   . Hypothyroidism   . Obesity, morbid ([place]) 06/13/2013  . COPD (chronic obstructive pulmonary disease) ([place]) 11/01/2009  . OSA and COPD overlap syndrome ([place]) 10/04/2009  .  Diverticulosis of colon with hemorrhage 09/10/2009  . History of colonic polyps 09/10/2009  . Essential hypertension 09/08/2008  . GERD (gastroesophageal reflux disease) 09/08/2008  . BPH with obstruction/lower urinary tract symptoms 09/08/2008    Past Surgical History:  Procedure Laterality Date  . CARDIAC CATHETERIZATION  7.27.05   revealed a preserved EF with no CAD  . COLONOSCOPY    . HERNIA REPAIR Bilateral   . JOINT REPLACEMENT  2001   lt total knee-rt one done 00  . NASAL SINUS SURGERY    . POLYPECTOMY     adenomatous polyps 2010  . SHOULDER ARTHROSCOPY    . TOE SURGERY Right   . TONSILLECTOMY    . TOTAL KNEE ARTHROPLASTY     bilateral        Home Medications    Prior to Admission medications   Medication Sig Start Date End Date Taking? Authorizing Provider  OXYGEN Inhale 2 L into the lungs continuous.   Yes [provider]  SYMBICORT 160-4.5 MCG/ACT inhaler USE 2 INHALATIONS TWICE A DAY 10/26/17  Yes Byrum, Rose Fillers, MD  albuterol (PROVENTIL HFA;VENTOLIN HFA) 108 (90 Base) MCG/ACT inhaler Inhale 2 puffs into the lungs every 6 (six) hours as needed for wheezing or shortness of breath. 08/21/15   Collene Gobble, MD  albuterol (PROVENTIL) (2.5 MG/3ML) 0.083% nebulizer solution Take 3 mLs (2.5 mg total) by nebulization every 4 (four) hours as needed for wheezing or shortness of breath. Patient taking differently: Take 2.5 mg by nebulization See admin instructions. Inhale one vial (2.5 mg) via nebulization every 4 hours while awake 10/31/17   Hosie Poisson, MD  ammonium lactate (LAC-HYDRIN) 12 % lotion Apply topically every morning. Patient not taking: Reported on 11/08/2017 10/31/17   Hosie Poisson, MD  aspirin EC 81 MG tablet Take 81 mg by mouth every Monday, Wednesday, and Friday. 11/16/17   [provider]  azelastine (ASTELIN) 0.1 % nasal spray USE 1 SPRAY IN EACH NOSTRIL TWICE A DAY 07/29/17   Parrett, Tammy S, NP  b complex vitamins capsule Take 1 capsule  by mouth daily.    [provider]  bisacodyl (DULCOLAX) 10 MG suppository Place 10 mg rectally once as needed (if no BM in 8 hours after MOM administered).    [provider]  calcium carbonate (TUMS - DOSED IN MG ELEMENTAL CALCIUM) 500 MG chewable tablet Chew 2 tablets by mouth 3 (three) times daily as needed for indigestion.    [provider]  carvedilol (COREG) 6.25 MG tablet Take 1 tablet (6.25 mg total) by mouth 2 (two) times  daily with a meal. 10/31/17   Hosie Poisson, MD  cefdinir ([name]) 300 MG capsule Take 1 capsule (300 mg total) by mouth every 12 (twelve) hours. Patient taking differently: Take 300 mg by mouth See admin instructions. Take 300 mg by mouth two times a day for 3 days 11/12/17   Donne Hazel, MD  Cholecalciferol (VITAMIN D) 2000 units tablet Take 4,000 Units by mouth daily.    [provider]  docusate sodium (COLACE) 100 MG capsule Take 100 mg by mouth 2 (two) times daily.      [provider]  furosemide (LASIX) 20 MG tablet Take 2 tablets (40 mg total) by mouth 2 (two) times daily. 11/12/17   Donne Hazel, MD  GLUCOSAMINE-CHONDROITIN PO Take 1 capsule by mouth daily.    [provider]  HYDROcodone-acetaminophen (NORCO/VICODIN) 5-325 MG tablet Take 1-2 tablets by mouth every 4 (four) hours as needed for severe pain.    [provider]  levothyroxine (SYNTHROID, LEVOTHROID) 100 MCG tablet TAKE 1 TABLET DAILY BEFORE BREAKFAST 06/17/17   Liane Comber, NP  Magnesium Hydroxide (MILK OF MAGNESIA PO) Take 30 mLs by mouth once as needed (if no BM in 3 days).    [provider]  Multiple Vitamin (MULTIVITAMIN WITH MINERALS) TABS tablet Take 1 tablet by mouth daily.    [provider]  Omega-3 Fatty Acids (FISH OIL) 1000 MG CAPS Take 1,000 mg by mouth 2 (two) times daily.    [provider]  omeprazole (PRILOSEC) 40 MG capsule TAKE 1 CAPSULE TWICE A DAY Patient taking differently: TAKE  1 CAPSULE (40 MG) BY MOUTH TWICE DAILY WITH A MEAL 06/17/17   Liane Comber, NP  potassium chloride (K-DUR) 10 MEQ tablet Take 1 tablet (10 mEq total) by mouth daily. Patient taking differently: Take 10-20 mEq by mouth See admin instructions. Order date 11/07/17: take 2 tablets (10 mg) by mouth twice daily for 3 days, then resume 1 tablet (10 mg) daily 04/09/17   Parrett, Fonnie Mu, NP  PRESCRIPTION MEDICATION Inhale into the lungs at bedtime. CPAP    [provider]  Sodium Phosphates (FLEET ENEMA RE) Place 1 each rectally once as needed (if no BM within 2 hours after dulcolax suppository).    [provider]  SPIRIVA HANDIHALER 18 MCG inhalation capsule INHALE THE CONTENTS OF 1 CAPSULE DAILY 08/18/17   Collene Gobble, MD    Family History Family History  Problem Relation Age of Onset  . Hypertension Mother   . Diabetes Father   . Heart failure Father   . Arrhythmia Sister 39  . Lung cancer Brother        chemical    Social History Social History   Tobacco Use  . Smoking status: Former Smoker    Packs/day: 1.00    Years: 20.00    Pack years: 20.00    Types: Cigarettes    Last attempt to quit: 08/01/1978    Years since quitting: 39.3  . Smokeless tobacco: Never Used  Substance Use Topics  . Alcohol use: Yes    Alcohol/week: 0.0 oz    Comment: small glass of wine daily  . Drug use: No     Allergies   Other; Ace inhibitors; Beta adrenergic blockers; Decadron [dexamethasone]; Fluticasone-salmeterol; Hytrin [terazosin]; Prednisone; and Vioxx [rofecoxib]   Review of Systems Review of Systems  All other systems reviewed and are negative.    Physical Exam Updated Vital Signs BP 106/63   Pulse (!) 45  Temp 99.1 F (37.3 C) (Oral)   Resp (!) 23   Ht 5\' 9"  (1.753 m)   Wt 103.9 kg (229 lb)   SpO2 92%   BMI 33.82 kg/m   Physical Exam  Constitutional: He is oriented to person, place, and time. He appears well-developed and well-nourished.  HENT:   Head: Normocephalic and atraumatic.  Right Ear: External ear normal.  Left Ear: External ear normal.  Nose: Nose normal.  Mouth/Throat: Oropharynx is clear and moist.  Eyes: Pupils are equal, round, and reactive to light. Conjunctivae and EOM are normal.  Neck: Normal range of motion. Neck supple.  Cardiovascular: Normal heart sounds and intact distal pulses. An irregularly irregular rhythm present.  Pulmonary/Chest: Effort normal and breath sounds normal.  Abdominal: Soft. Bowel sounds are normal.  Musculoskeletal: He exhibits edema.  Neurological: He is alert and oriented to person, place, and time.  Skin: Skin is warm. Capillary refill takes less than 2 seconds.  Chronic skin changes BLE  Psychiatric: He has a normal mood and affect. His behavior is normal. Judgment and thought content normal.  Nursing note and vitals reviewed.    ED Treatments / Results  Labs (all labs ordered are listed, but only abnormal results are displayed) Labs Reviewed  BASIC METABOLIC PANEL - Abnormal; Notable for the following components:      Result Value   Sodium 132 (*)    Chloride 90 (*)    CO2 33 (*)    Calcium 8.6 (*)    All other components within normal limits  CBC - Abnormal; Notable for the following components:   WBC 11.6 (*)    All other components within normal limits  URINALYSIS, ROUTINE W REFLEX MICROSCOPIC - Abnormal; Notable for the following components:   APPearance HAZY (*)    Bacteria, UA RARE (*)    All other components within normal limits  I-STAT TROPONIN, ED - Abnormal; Notable for the following components:   Troponin i, poc 0.11 (*)    All other components within normal limits  URINE CULTURE    EKG EKG Interpretation  Date/Time:  Friday Nov 13 2017 15:07:31 EDT Ventricular Rate:  83 PR Interval:    QRS Duration: 212 QT Interval:  476 QTC Calculation: 492 R Axis:   -81 Text Interpretation:  Wandering atrial pacemaker Ventricular bigeminy Right bundle branch  block Confirmed by Isla Pence [phone]) on 11/13/2017 3:32:40 PM   Radiology Dg Chest Port 1 View  Result Date: 11/13/2017 CLINICAL DATA:  Bradycardia.  Former smoker. EXAM: PORTABLE CHEST 1 VIEW COMPARISON:  11/11/2017; 10/25/2017; 04/07/2017 FINDINGS: Grossly unchanged enlarged cardiac silhouette and mediastinal contours with atherosclerotic plaque within thoracic aorta. Grossly unchanged small bilateral effusions and associated bibasilar heterogeneous/consolidative opacities, left greater than right. No new focal airspace opacities. Re demonstrated thinning the biapical pulmonary parenchyma however the pulmonary vasculature appears indistinct within mid and lower lungs. No definite pneumothorax, though note, evaluation degraded secondary to patient's overlying chin. No acute osseus abnormalities. IMPRESSION: Similar findings of cardiomegaly and pulmonary edema superimposed on advanced emphysematous change with small bilateral effusions and associated bibasilar opacities, left greater than right, atelectasis versus infiltrate. Electronically Signed   By: Sandi Mariscal [name]   On: 11/13/2017 15:59    Procedures Procedures (including critical care time)  Medications Ordered in ED Medications  sodium chloride 0.9 % bolus 500 mL (500 mLs Intravenous New Bag/Given 11/13/17 1716)     Initial Impression / Assessment and Plan / ED Course  I have reviewed  the triage vital signs and the nursing notes.  Pertinent labs & imaging results that were available during my care of the patient were reviewed by me and considered in my medical decision making (see chart for details).    Bradycardia on vitals from monitor not picking up the bigeminy beats.  Pt given gentle hydration as he is orthostatic.    Pt is not well enough for d/c back to SNF.  I think his medications need further adjusting.  I spoke with Dr. Myna Hidalgo (triad) for admission.      Final Clinical Impressions(s) / ED Diagnoses   Final  diagnoses:  Bigeminy  Orthostatic hypotension  Hyponatremia    ED Discharge Orders    None       Isla Pence, MD 11/13/17 2009

## 2017-11-13 NOTE — ED Triage Notes (Signed)
EMS states patient has bradycardia since this AM, per nursing home. Patient states no pain or n/v.

## 2017-11-14 DIAGNOSIS — L8990 Pressure ulcer of unspecified site, unspecified stage: Secondary | ICD-10-CM

## 2017-11-14 LAB — URINE CULTURE

## 2017-11-14 LAB — BASIC METABOLIC PANEL
Anion gap: 10 (ref 5–15)
BUN: 13 mg/dL (ref 6–20)
CHLORIDE: 94 mmol/L — AB (ref 101–111)
CO2: 29 mmol/L (ref 22–32)
Calcium: 8.3 mg/dL — ABNORMAL LOW (ref 8.9–10.3)
Creatinine, Ser: 0.75 mg/dL (ref 0.61–1.24)
GFR calc non Af Amer: >60 mL/min (ref >60)
Glucose, Bld: 137 mg/dL — ABNORMAL HIGH (ref 65–99)
POTASSIUM: 3.4 mmol/L — AB (ref 3.5–5.1)
SODIUM: 133 mmol/L — AB (ref 135–145)

## 2017-11-14 LAB — CBC
HEMATOCRIT: 39.7 % (ref 39.0–52.0)
Hemoglobin: 13.2 g/dL (ref 13.0–17.0)
MCH: 30.6 pg (ref 26.0–34.0)
MCHC: 33.2 g/dL (ref 30.0–36.0)
MCV: 92.1 fL (ref 78.0–100.0)
Platelets: 138 10*3/uL — ABNORMAL LOW (ref 150–400)
RBC: 4.31 MIL/uL (ref 4.22–5.81)
RDW: 13.1 % (ref 11.5–15.5)
WBC: 10 10*3/uL (ref 4.0–10.5)

## 2017-11-14 LAB — TROPONIN I
TROPONIN I: 0.1 ng/mL — AB (ref <0.03)
Troponin I: 0.09 ng/mL (ref <0.03)

## 2017-11-14 LAB — GLUCOSE, CAPILLARY
GLUCOSE-CAPILLARY: 115 mg/dL — AB (ref 65–99)
Glucose-Capillary: 130 mg/dL — ABNORMAL HIGH (ref 65–99)

## 2017-11-14 LAB — BRAIN NATRIURETIC PEPTIDE: B NATRIURETIC PEPTIDE 5: 698.4 pg/mL — AB (ref 0.0–100.0)

## 2017-11-14 LAB — MAGNESIUM: Magnesium: 2 mg/dL (ref 1.7–2.4)

## 2017-11-14 MED ORDER — OXYCODONE HCL 5 MG PO TABS
2.5000 mg | ORAL_TABLET | ORAL | Status: DC | PRN
  Administered 2017-11-15 – 2017-11-23 (×10): 2.5 mg via ORAL
  Filled 2017-11-14 (×11): qty 1

## 2017-11-14 NOTE — Clinical Social Work Note (Signed)
Clinical Social Work Assessment  Patient Details  Name: Richard Rosales MRN: [id number] Date of Birth: 13-Jan-1931  Date of referral:  11/14/17               Reason for consult:  Discharge Planning                Permission sought to share information with:  Chartered certified accountant granted to share information::  Yes, Verbal Permission Granted  Name::        Agency::  [facility]  Relationship::     Contact Information:     Housing/Transportation Living arrangements for the past 2 months:  [place], [place] of Information:  Patient, Medical Team Patient Interpreter Needed:  None Criminal Activity/Legal Involvement Pertinent to Current Situation/Hospitalization:  No - Comment as needed Significant Relationships:  Adult Children, Spouse Lives with:  Spouse Do you feel safe going back to the place where you live?  Yes Need for family participation in patient care:  Yes (Comment)  Care giving concerns:  Patient is a short-term rehab resident at [facility].   Social Worker assessment / plan:  CSW met with patient. No supports at bedside. CSW introduced role and explained that discharge planning would be discussed. Patient confirmed he was admitted from [facility] and plans to return at discharge. PT evaluation pending. No further concerns. CSW encouraged patient to contact CSW as needed. CSW will continue to follow patient for support and facilitate discharge back to SNF once medically stable.  Employment status:  Retired Forensic scientist:  Medicare PT Recommendations:  Not assessed at this time [place] / Referral to community resources:  [place]  Patient/Family's Response to care:  Patient agreeable to return to SNF. Patient's family supportive and involved in patient's care. Patient appreciated social work intervention.  Patient/Family's Understanding of and  Emotional Response to Diagnosis, Current Treatment, and Prognosis:  Patient has a good understanding of the reason for admission and plan to return to SNF at discharge. Patient appears happy with hospital care.  Emotional Assessment Appearance:  Appears stated age Attitude/Demeanor/Rapport:  Engaged, Gracious Affect (typically observed):  Accepting, Appropriate, Calm, Pleasant Orientation:  Oriented to Self, Oriented to Place, Oriented to  Time, Oriented to Situation Alcohol / Substance use:  Never Used Psych involvement (Current and /or in the community):  No (Comment)  Discharge Needs  Concerns to be addressed:  Care Coordination Readmission within the last 30 days:  Yes Current discharge risk:  Dependent with Mobility Barriers to Discharge:  Continued Medical Work up   Candie Chroman, LCSW 11/14/2017, 10:23 AM

## 2017-11-14 NOTE — Progress Notes (Signed)
It is not safe for pt to stand for orthostatic vitals at this time as, he is too weak. Will reassess and continue to monitor pt.

## 2017-11-14 NOTE — Evaluation (Signed)
Physical Therapy Evaluation Patient Details Name: Richard Rosales MRN: [id number] DOB: 1930-07-23 Today's Date: 11/14/2017   History of Present Illness  84 y.o. male admitted 11/13/17 after syncopal event at [facility]. Pt was recently d/c from hospital 5/23 for management of lethargy, hypokalemia, and UTI. PMH includes: systolic CHF, a fib not on anticoagulation, COPD with chronic hypoxic respiratory failure and UTI. Patients BP was 81/47 at ED arrival. Chest Xray notable for cardiomegaly with pulmonary edema superimposed on emphysematous changes.     Clinical Impression  Pt admitted with above diagnosis. Pt currently with functional limitations due to the deficits listed below (see PT Problem List). PTA, pt recovering at SNF for rehab. Has not ambulated in weeks. Upon eval pt presents with fatigue and deconditioning. Requiring max A for all bed mobility, tolerating sitting EOB with min guard. Patient is too weak to walk at this time and will benefit from continued therapy at SNF once medically able.  Pt will benefit from skilled PT to increase their independence and safety with mobility to allow discharge to the venue listed below.       Follow Up Recommendations SNF;Supervision/Assistance - 24 hour    Equipment Recommendations  None recommended by PT    Recommendations for Other Services OT consult     Precautions / Restrictions Precautions Precautions: Fall Precaution Comments: O2 dep Restrictions Weight Bearing Restrictions: No      Mobility  Bed Mobility Overal bed mobility: Needs Assistance Bed Mobility: Supine to Sit;Sit to Supine;Rolling Rolling: Max assist;+2 for safety/equipment   Supine to sit: Max assist;+2 for physical assistance        Transfers                 General transfer comment: defered 2/2 to weakness  Ambulation/Gait                Stairs            Wheelchair Mobility    Modified Rankin (Stroke Patients Only)       Balance  Overall balance assessment: Needs assistance Sitting-balance support: No upper extremity supported;Feet supported Sitting balance-Leahy Scale: Fair       Standing balance-Leahy Scale: Poor                               Pertinent Vitals/Pain Pain Assessment: No/denies pain    Home Living Family/patient expects to be discharged to:: Skilled nursing facility                      Prior Function Level of Independence: Needs assistance(Living at SNF)               Hand Dominance   Dominant Hand: Right    Extremity/Trunk Assessment   Upper Extremity Assessment Upper Extremity Assessment: Defer to OT evaluation;Generalized weakness    Lower Extremity Assessment Lower Extremity Assessment: Generalized weakness       Communication   Communication: No difficulties  Cognition Arousal/Alertness: Awake/alert Behavior During Therapy: WFL for tasks assessed/performed                         Memory: Decreased short-term memory         General Comments: AOx4, unable to recall working together several days  ago      General Comments      Exercises General Exercises - Lower Extremity Long Arc Quad: 10  reps   Assessment/Plan    PT Assessment Patient needs continued PT services  PT Problem List Decreased strength;Decreased balance;Decreased mobility;Decreased activity tolerance;Decreased knowledge of use of DME       PT Treatment Interventions DME instruction;Gait training;Functional mobility training;Therapeutic activities;Balance training;Patient/family education;Therapeutic exercise    PT Goals (Current goals can be found in the Care Plan section)  Acute Rehab PT Goals Patient Stated Goal: Get stronger PT Goal Formulation: With patient/family Time For Goal Achievement: 11/24/17 Potential to Achieve Goals: Good    Frequency Min 2X/week   Barriers to discharge        Co-evaluation               AM-PAC PT "6 Clicks"  Daily Activity  Outcome Measure Difficulty turning over in bed (including adjusting bedclothes, sheets and blankets)?: Unable Difficulty moving from lying on back to sitting on the side of the bed? : Unable Difficulty sitting down on and standing up from a chair with arms (e.g., wheelchair, bedside commode, etc,.)?: Unable Help needed moving to and from a bed to chair (including a wheelchair)?: A Lot Help needed walking in hospital room?: A Lot Help needed climbing 3-5 steps with a railing? : Total 6 Click Score: 8    End of Session Equipment Utilized During Treatment: Gait belt;Oxygen Activity Tolerance: Patient tolerated treatment well Patient left: with call bell/phone within reach;with family/visitor present;in bed Nurse Communication: Mobility status PT Visit Diagnosis: Muscle weakness (generalized) (M62.81);Difficulty in walking, not elsewhere classified (R26.2);Unsteadiness on feet (R26.81);Other abnormalities of gait and mobility (R26.89)    Time: 1325-1350 PT Time Calculation (min) (ACUTE ONLY): 25 min   Charges:   PT Evaluation $PT Eval Low Complexity: 1 Low PT Treatments $Therapeutic Activity: 8-22 mins   PT G Codes:        Reinaldo Berber, PT, DPT Acute Rehab Services Pager: [phone]    Reinaldo Berber 11/14/2017, 2:22 PM

## 2017-11-14 NOTE — Progress Notes (Signed)
PROGRESS NOTE    Richard Rosales  VVO:[id number] DOB: Jan 18, 1931 DOA: 11/13/2017 PCP: Unk Pinto, MD   Brief Narrative:  Richard Rosales is a 84 y.o. male with medical history significant for chronic systolic CHF, atrial fibrillation not on anticoagulation, hypothyroidism, COPD with chronic hypoxic respiratory failure, and UTI and antibiotics, now presenting for evaluation of syncope.  Patient was discharged from the hospital yesterday after management of the lethargy, hypokalemia, and UTI.  His condition was in part attributed to recent diuresis and the patient was hydrated while in the hospital.  He was treated with antibiotics and discharged back to his SNF yesterday to complete a course of [place].  He reports feeling well while at rest, but becomes acutely lightheaded while sitting up or trying to stand.  He had a syncopal episode today while trying to participate in physical therapy.  He denies any chest pain or palpitations and denies shortness of breath, though he is requiring increased supplemental oxygen.  Assessment & Plan:   Principal Problem:   Syncope Active Problems:   OSA and COPD overlap syndrome (HCC)   Essential hypertension   COPD (chronic obstructive pulmonary disease) (HCC)   Hypothyroidism   A-fib (HCC)   Acute on chronic systolic CHF (congestive heart failure) (HCC)   Elevated troponin   UTI (urinary tract infection)   Syncope  - Presents from SNF after a syncopal episode  - He was just discharged the day before and has reportedly been orthostatic with reported 50 mmHg drop in SBP on standing at his SNF and he had a syncopal episode while during PT  - Patient denies chest pain, palpitations, or any other complaints  - Given 500 cc NS in ED and now receiving 75 cc/hr NS - follow up repeat orthostatics, ability to work with PT - holding coreg, lasix - Echo done earlier this month with EF 40-45%, diffuse HK, severe LAE, mild AI, mild AS, and mild pulm HTN  -  family notes he's had some slurred speech recently as well (they note this comes and goes).  Will follow up MRI brain.  Generalized Weakness: multifactorial.  Likely some contribution from spinal stenosis, heart failure, and other comorbidities.  Not a great candidate for and surgery for spinal disease.  Had appointment with Dr. [name] as outpatient that he missed as result of his recent hospitalizations.  Discussed again with Dr. [name] and noted we could potentially plan for outpatient neuromuscular studies as well with neurology.  No new numbess to LE's and he has R>L weakness of his LE (asymmetry is chronic related to injury in past per family, but it seems he's generally weaker).  Following MRI above as well.   - continue PT/OT and treatment of other chronic medical conditions  2. Chronic systolic CHF  - CXR with cardiomegaly and pulm edema with small bilateral effusions and L greater than R bibasilar opacities - O2 requirement increased, will try to wean back to baseline - Lasix held in light of orthostasis with syncope and low BP's  - He is being gently hydrated  - Follow daily wt and I/O's, hold Lasix and Coreg in light of hypotensive episodes  - Echo done earlier this month with EF 40-45%, diffuse HK, severe LAE, mild AI, mild AS, and mild pulm HTN   - if not improving with holding diuretics, consider cardiology consult  Wt Readings from Last 3 Encounters:  11/14/17 104.7 kg (230 lb 13.2 oz)  11/12/17 106.4 kg (234 lb 9.1 oz)  10/31/17 103 kg (227 lb 1.2 oz)    3. COPD with chronic hypoxic respiratory failure  - No wheezing on admission, has increased O2-requirement but denies SOB or cough  - Continue ICS/LABA, Spiriva, and prn albuterol  - Continue supplemental O2 (generally on 2 L, but at present on 5 - will wean and see how he does)  4. Atrial fibrillation  - In an atrial arrhythmia on admission  - CHADS-VASc at least 3 (age x2, CHF)  - Follows with cardiology and has declined  anticoagulation  - Beta-blocker held on admission in light of low blood pressures  (brady noted in chart, but not on tele)  5. Hypothyroidism  - Thyroid studies normal earlier this month  - Continue Synthroid    6. UTI  - Treated with Rocephin during recent hospitalization and transitioned to [facility] on discharge  - No fever on admission  - Continue [name] to complete the course    7. Elevated troponin  - Troponin is elevated to 0.11 in ED (down from previous hospitalization) - Patient denies chest pain or SOB  - Continue cardiac monitoring, trend troponin, hold Coreg in light of hypotension   # Hypokalemia: mild, replete with K in IVF  DVT prophylaxis: lovenox Code Status: DNR Family Communication: wife/daughter at bedside Disposition Plan: pending improvement   Consultants:   none  Procedures:   none  Antimicrobials:  Anti-infectives (From admission, onward)   Start     Dose/Rate Route Frequency Ordered Stop   11/14/17 0030  cefdinir ([name]) capsule 300 mg    Note to Pharmacy:  Patient taking differently: Take 300 mg by mouth two times a day for 3 days     300 mg Oral Every 12 hours 11/13/17 2038        Subjective: No CP, No SOB. A&OX3.  Doesn't remember fainting.  He's not able to tell me about this, but does tell me about his generalized weakness and repeated hospital admissions.  Objective: Vitals:   11/14/17 0430 11/14/17 0445 11/14/17 0502 11/14/17 0638  BP:   91/79 112/78  Pulse: 68 (!) 38 86 (!) 41  Resp: (!) 24 (!) 24 (!) 25 20  Temp:    99 F (37.2 C)  TempSrc:    Oral  SpO2: 99% 100% 98% 97%  Weight:    104.7 kg (230 lb 13.2 oz)  Height:        Intake/Output Summary (Last 24 hours) at 11/14/2017 0801 Last data filed at 11/14/2017 0410 Gross per 24 hour  Intake 600 ml  Output 400 ml  Net 200 ml   Filed Weights   11/13/17 1506 11/14/17 0638  Weight: 103.9 kg (229 lb) 104.7 kg (230 lb 13.2 oz)    Examination:  General exam:  Appears calm and comfortable  Respiratory system: Crackles at bases. Respiratory effort normal. Cardiovascular system: S1 & S2 heard, RRR. No JVD, murmurs, rubs, gallops or clicks. No pedal edema. Gastrointestinal system: Abdomen is nondistended, soft and nontender. No organomegaly or masses felt. Normal bowel sounds heard. Central nervous system: Alert and oriented. Generalized weakness, seems most prominent to LE's.  Chronic R>L LE weakness (hx injury to RLE per family).     Extremities: bilateral LE edema (worse in R, chronic per family) Skin: No rashes, lesions or ulcers Psychiatry: Judgement and insight appear normal. Mood & affect appropriate.     Data Reviewed: I have personally reviewed following labs and imaging studies  CBC: Recent Labs  Lab 11/08/17 1531  11/10/17 0242 11/11/17 0238 11/12/17 0239 11/13/17 1532 11/14/17 0245  WBC 18.4*   < > 13.2* 11.4* 11.2* 11.6* 10.0  NEUTROABS 15.2*  --  9.9* 8.0*  --   --   --   HGB 15.1   < > 13.7 13.7 12.9* 13.5 13.2  HCT 45.1   < > 41.2 41.0 39.0 40.9 39.7  MCV 89.7   < > 90.0 89.5 91.1 90.5 92.1  PLT 227   < > 204 181 162 163 138*   < > = values in this interval not displayed.   Basic Metabolic Panel: Recent Labs  Lab 11/09/17 0536  11/10/17 1351 11/11/17 0238 11/12/17 0239 11/13/17 1532 11/14/17 0245  NA 135   < > 131* 131* 132* 132* 133*  K 3.9   < > 3.1* 4.1 4.1 3.5 3.4*  CL 89*   < > 88* 91* 90* 90* 94*  CO2 33*   < > 33* 30 33* 33* 29  GLUCOSE 81   < > 97 98 97 97 137*  BUN 27*   < > 23* 18 14 16 13   CREATININE 0.77   < > 0.74 0.62 0.65 0.79 0.75  CALCIUM 8.6*   < > 8.3* 8.5* 8.6* 8.6* 8.3*  MG 2.0  --   --   --   --   --  2.0  PHOS 2.4*  --   --   --   --   --   --    < > = values in this interval not displayed.   GFR: Estimated Creatinine Clearance: 82 mL/min (by C-G formula based on SCr of 0.75 mg/dL). Liver Function Tests: No results for input(s): AST, ALT, ALKPHOS, BILITOT, PROT, ALBUMIN in the last  168 hours. No results for input(s): LIPASE, AMYLASE in the last 168 hours. No results for input(s): AMMONIA in the last 168 hours. Coagulation Profile: No results for input(s): INR, PROTIME in the last 168 hours. Cardiac Enzymes: Recent Labs  Lab 11/13/17 2059 11/14/17 0245  TROPONINI 0.10* 0.09*   BNP (last 3 results) Recent Labs    04/07/17 1632  PROBNP 230.0*   HbA1C: No results for input(s): HGBA1C in the last 72 hours. CBG: No results for input(s): GLUCAP in the last 168 hours. Lipid Profile: No results for input(s): CHOL, HDL, LDLCALC, TRIG, CHOLHDL, LDLDIRECT in the last 72 hours. Thyroid Function Tests: No results for input(s): TSH, T4TOTAL, FREET4, T3FREE, THYROIDAB in the last 72 hours. Anemia Panel: No results for input(s): VITAMINB12, FOLATE, FERRITIN, TIBC, IRON, RETICCTPCT in the last 72 hours. Sepsis Labs: Recent Labs  Lab 11/08/17 1550 11/08/17 1929 11/09/17 0333  LATICACIDVEN 2.53* 0.99 0.9    Recent Results (from the past 240 hour(s))  Urine culture     Status: Abnormal   Collection Time: 11/08/17  6:00 PM  Result Value Ref Range Status   Specimen Description URINE, RANDOM  Final   Special Requests   Final    NONE Performed at [facility], [place] [address] [place], [place] [address]    Culture >=100,000 COLONIES/mL ENTEROBACTER CLOACAE (A)  Final   Report Status 11/10/2017 FINAL  Final   Organism ID, Bacteria ENTEROBACTER CLOACAE (A)  Final      Susceptibility   Enterobacter cloacae - MIC*    CEFAZOLIN >=64 RESISTANT Resistant     CEFTRIAXONE <=1 SENSITIVE Sensitive     CIPROFLOXACIN <=0.25 SENSITIVE Sensitive     GENTAMICIN <=1 SENSITIVE Sensitive  IMIPENEM <=0.25 SENSITIVE Sensitive     NITROFURANTOIN 64 INTERMEDIATE Intermediate     TRIMETH/SULFA <=20 SENSITIVE Sensitive     PIP/TAZO 16 SENSITIVE Sensitive     * >=100,000 COLONIES/mL ENTEROBACTER CLOACAE  MRSA PCR Screening     Status: None   Collection Time: 11/08/17 10:12 PM   Result Value Ref Range Status   MRSA by PCR NEGATIVE NEGATIVE Final    Comment:        The GeneXpert MRSA Assay (FDA approved for NASAL specimens only), is one component of a comprehensive MRSA colonization surveillance program. It is not intended to diagnose MRSA infection nor to guide or monitor treatment for MRSA infections. Performed at [facility], [place] [address] [place], [place] [address]          Radiology Studies: Dg Chest Port 1 View  Result Date: 11/13/2017 CLINICAL DATA:  Bradycardia.  Former smoker. EXAM: PORTABLE CHEST 1 VIEW COMPARISON:  11/11/2017; 10/25/2017; 04/07/2017 FINDINGS: Grossly unchanged enlarged cardiac silhouette and mediastinal contours with atherosclerotic plaque within thoracic aorta. Grossly unchanged small bilateral effusions and associated bibasilar heterogeneous/consolidative opacities, left greater than right. No new focal airspace opacities. Re demonstrated thinning the biapical pulmonary parenchyma however the pulmonary vasculature appears indistinct within mid and lower lungs. No definite pneumothorax, though note, evaluation degraded secondary to patient's overlying chin. No acute osseus abnormalities. IMPRESSION: Similar findings of cardiomegaly and pulmonary edema superimposed on advanced emphysematous change with small bilateral effusions and associated bibasilar opacities, left greater than right, atelectasis versus infiltrate. Electronically Signed   By: Sandi Mariscal [name]   On: 11/13/2017 15:59        Scheduled Meds: . aspirin EC  81 mg Oral Q M,W,F  . B-complex with vitamin C  1 tablet Oral Daily  . cefdinir  300 mg Oral Q12H  . cholecalciferol  4,000 Units Oral Daily  . docusate sodium  100 mg Oral BID  . enoxaparin (LOVENOX) injection  40 mg Subcutaneous Daily  . levothyroxine  100 mcg Oral QAC breakfast  . mometasone-formoterol  2 puff Inhalation BID  . multivitamin with minerals  1 tablet Oral Daily  . pantoprazole  40  mg Oral BID  . potassium chloride  10 mEq Oral Daily  . sodium chloride flush  3 mL Intravenous Q12H  . tiotropium  1 capsule Inhalation Daily   Continuous Infusions: . 0.9 % NaCl with KCl 20 mEq / L 75 mL/hr at 11/14/17 0124     LOS: 1 day    Time spent: over [place], MD [facility] Pager [phone]   If 7PM-7AM, please contact night-coverage [email or web address] Password [email or web address] 11/14/2017, 8:01 AM

## 2017-11-14 NOTE — ED Notes (Signed)
Pt given sandwich meal and CF Coke, per request.

## 2017-11-14 NOTE — Progress Notes (Signed)
Pt requests to sit in the chair. Pt states he cannot move his legs. Pt states he has not walked in four weeks. Pt unable to dangle over the edge. Primary RN aware

## 2017-11-15 ENCOUNTER — Inpatient Hospital Stay (HOSPITAL_COMMUNITY): Payer: Medicare Other

## 2017-11-15 DIAGNOSIS — I951 Orthostatic hypotension: Secondary | ICD-10-CM

## 2017-11-15 LAB — COMPREHENSIVE METABOLIC PANEL
ALBUMIN: 2.3 g/dL — AB (ref 3.5–5.0)
ALK PHOS: 88 U/L (ref 38–126)
ALT: 24 U/L (ref 17–63)
AST: 26 U/L (ref 15–41)
Anion gap: 7 (ref 5–15)
BILIRUBIN TOTAL: 0.8 mg/dL (ref 0.3–1.2)
BUN: 8 mg/dL (ref 6–20)
CALCIUM: 8.2 mg/dL — AB (ref 8.9–10.3)
CO2: 33 mmol/L — AB (ref 22–32)
CREATININE: 0.54 mg/dL — AB (ref 0.61–1.24)
Chloride: 91 mmol/L — ABNORMAL LOW (ref 101–111)
GFR calc Af Amer: >60 mL/min (ref >60)
GFR calc non Af Amer: >60 mL/min (ref >60)
Glucose, Bld: 89 mg/dL (ref 65–99)
Potassium: 4.2 mmol/L (ref 3.5–5.1)
SODIUM: 131 mmol/L — AB (ref 135–145)
Total Protein: 5.5 g/dL — ABNORMAL LOW (ref 6.5–8.1)

## 2017-11-15 LAB — CBC
HEMATOCRIT: 38.3 % — AB (ref 39.0–52.0)
Hemoglobin: 12.5 g/dL — ABNORMAL LOW (ref 13.0–17.0)
MCH: 30.2 pg (ref 26.0–34.0)
MCHC: 32.6 g/dL (ref 30.0–36.0)
MCV: 92.5 fL (ref 78.0–100.0)
PLATELETS: 144 10*3/uL — AB (ref 150–400)
RBC: 4.14 MIL/uL — ABNORMAL LOW (ref 4.22–5.81)
RDW: 13 % (ref 11.5–15.5)
WBC: 8.1 10*3/uL (ref 4.0–10.5)

## 2017-11-15 LAB — GLUCOSE, CAPILLARY: Glucose-Capillary: 72 mg/dL (ref 65–99)

## 2017-11-15 LAB — MAGNESIUM: Magnesium: 2.1 mg/dL (ref 1.7–2.4)

## 2017-11-15 MED ORDER — AZELASTINE HCL 0.1 % NA SOLN
1.0000 | Freq: Two times a day (BID) | NASAL | Status: DC
  Administered 2017-11-16 – 2017-11-24 (×14): 1 via NASAL
  Filled 2017-11-15 (×2): qty 30

## 2017-11-15 NOTE — Progress Notes (Signed)
PROGRESS NOTE    Richard Rosales  ZOX:[id number] DOB: 04-Aug-1930 DOA: 11/13/2017 PCP: Richard Pinto, MD   Brief Narrative:  Richard Rosales is Richard Rosales 84 y.o. male with medical history significant for chronic systolic CHF, atrial fibrillation not on anticoagulation, hypothyroidism, COPD with chronic hypoxic respiratory failure, and UTI and antibiotics, now presenting for evaluation of syncope.  Patient was discharged from the hospital yesterday after management of the lethargy, hypokalemia, and UTI.  His condition was in part attributed to recent diuresis and the patient was hydrated while in the hospital.  He was treated with antibiotics and discharged back to his SNF yesterday to complete Richard Rosales course of Richard Rosales.  He reports feeling well while at rest, but becomes acutely lightheaded while sitting up or trying to stand.  He had Richard Rosales syncopal episode today while trying to participate in physical therapy.  He denies any chest pain or palpitations and denies shortness of breath, though he is requiring increased supplemental oxygen.  Assessment & Plan:   Principal Problem:   Syncope Active Problems:   OSA and COPD overlap syndrome (HCC)   Essential hypertension   COPD (chronic obstructive pulmonary disease) (HCC)   Hypothyroidism   Richard Rosales-fib (HCC)   Acute on chronic systolic CHF (congestive heart failure) (HCC)   Elevated troponin   UTI (urinary tract infection)   Pressure injury of skin   Syncope  - Presents from SNF after Richard Rosales syncopal episode.  Thought 2/2 orthostatic hypotension 2/2 hypovolemia and BP meds, but his lasix was recently held.  ? Whether possibly related to his polyneuropathy.   - He was just discharged the day before and has reportedly been orthostatic with reported 50 mmHg drop in SBP on standing at his SNF and he had Richard Rosales syncopal episode while during PT  - Patient denies chest pain, palpitations, or any other complaints  - S/p IVF, currently holding lasix.  Also holding coreg. - follow  up repeat orthostatics, ability to work with PT as seems improved today - Echo done earlier this month with EF 40-45%, diffuse HK, severe LAE, mild AI, mild AS, and mild pulm HTN  - family notes he's had some slurred speech recently as well (they note this comes and goes).  Will follow up MRI brain (notable for atrophy and white matter changes advanced for age).  Generalized Weakness: multifactorial.  Likely some contribution from spinal stenosis, heart failure, and other comorbidities.  Not Richard Rosales great candidate for and surgery for spinal disease.  Had appointment with Dr. Annette Rosales as outpatient that he missed as result of his recent hospitalizations.  Discussed again with Dr. 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 Garth Rosales note from 12/04/2016.  MRI notable for atrophy and white matter changes moderately advance for age - nonspecific finding.  See report.   - His LE strength seemed better to me today, will have him work with PT, hopefully improved and can follow outpatient - continue PT/OT and treatment of other chronic medical conditions  2. Chronic systolic CHF  - CXR with cardiomegaly and pulm edema with small bilateral effusions and L greater than R bibasilar opacities (CXR Rosales) - O2 requirement increased, back to baseline today - Lasix held in light of  orthostasis with syncope and low BP's  - He is being gently hydrated  - Follow daily wt and I/O's, hold Lasix and Coreg in light of hypotensive episodes  - Echo done earlier this month with EF 40-45%, diffuse HK, severe LAE, mild AI, mild AS, and mild pulm HTN   - if not improving with holding diuretics, consider cardiology consult  Wt Readings from Last 3  Encounters:  11/15/17 105.2 kg (231 lb 14.8 oz)  11/12/17 106.4 kg (234 lb 9.1 oz)  10/31/17 103 kg (227 lb 1.2 oz)    3. COPD with chronic hypoxic respiratory failure  - No wheezing on admission, has increased O2-requirement but denies SOB or cough  - Continue ICS/LABA, Spiriva, and prn albuterol  - Continue supplemental O2 (generally on 2 L, back to baseline)  4. Atrial fibrillation  - In an atrial arrhythmia on admission  - CHADS-VASc at least 3 (age x2, CHF)  - Follows with cardiology and has declined anticoagulation  - Beta-blocker held on admission in light of low blood pressures   5. Hypothyroidism  - Thyroid studies normal earlier this month  - Continue Synthroid    6. UTI  - Treated with Rocephin during recent hospitalization and transitioned to Richard Rosales on discharge  - No fever on admission  - will d/c omnicef after completes regimen tonight  7. Elevated troponin  - Troponin is elevated to 0.11 in ED (down from previous hospitalization) - Patient denies chest pain or SOB  - Continue cardiac monitoring, trend troponin, hold Coreg in light of hypotension   # Hypokalemia: mild, replete with K in IVF  DVT prophylaxis: lovenox Code Status: DNR Family Communication: wife/daughter at bedside Disposition Plan: pending improvement   Consultants:   none  Procedures:   none  Antimicrobials:  Anti-infectives (From admission, onward)   Start     Dose/Rate Route Frequency Ordered Stop   11/14/17 0030  cefdinir ([name]) capsule 300 mg    Note to Pharmacy:  Patient taking differently: Take 300 mg by mouth two times [name] day for 3 days     300 mg Oral Every 12 hours 11/13/17 2038        Subjective: Feeling ok. No complaints.   Objective: Vitals:   11/15/17 0831 11/15/17 0836 11/15/17 0837 11/15/17 1148  BP:   (!) 134/101 (!) 146/101  Pulse: 87  (!) 113 94  Resp: 18   18  Temp:    98.3 F (36.8 C)  TempSrc:    Oral  SpO2: 98% 98% 100% 96%  Weight:        Height:        Intake/Output Summary (Last 24 hours) at 11/15/2017 1422 Last data filed at 11/15/2017 0900 Gross per 24 hour  Intake 480 ml  Output 1350 ml  Net -870 ml   Filed Weights   11/13/17 1506 11/14/17 0638 11/15/17 0410  Weight: 103.9 kg (229 lb) 104.7 kg (230 lb 13.2 oz) 105.2 kg (231 lb 14.8 oz)    Examination:  General: No acute distress. Cardiovascular: Heart sounds show Laurence Folz regular rate, and rhythm. No gallops or rubs. No murmurs. No JVD. Lungs: Clear to auscultation bilaterally with good air movement. No rales, rhonchi or wheezes. Abdomen: Soft, nontender, nondistended with normal active bowel sounds. No masses. No hepatosplenomegaly. Neurological: Alert and oriented 3. Moves all extremities 4. Cranial nerves II through XII grossly intact.  5/5 strength to upper extremities.  RLE strength improved today.  Skin: Warm and dry. No rashes  or lesions. Extremities: No clubbing or cyanosis. No edema. Pedal pulses 2+. Psychiatric: Mood and affect are normal. Insight and judgment are appropriate.    Data Reviewed: I have personally reviewed following labs and imaging studies  CBC: Recent Labs  Lab 11/08/17 1531  11/10/17 0242 11/11/17 0238 11/12/17 0239 11/13/17 1532 11/14/17 0245 11/15/17 0426  WBC 18.4*   < > 13.2* 11.4* 11.2* 11.6* 10.0 8.1  NEUTROABS 15.2*  --  9.9* 8.0*  --   --   --   --   HGB 15.1   < > 13.7 13.7 12.9* 13.5 13.2 12.5*  HCT 45.1   < > 41.2 41.0 39.0 40.9 39.7 38.3*  MCV 89.7   < > 90.0 89.5 91.1 90.5 92.1 92.5  PLT 227   < > 204 181 162 163 138* 144*   < > = values in this interval not displayed.   Basic Metabolic Panel: Recent Labs  Lab 11/09/17 0536  11/11/17 0238 11/12/17 0239 11/13/17 1532 11/14/17 0245 11/15/17 0426  NA 135   < > 131* 132* 132* 133* 131*  K 3.9   < > 4.1 4.1 3.5 3.4* 4.2  CL 89*   < > 91* 90* 90* 94* 91*  CO2 33*   < > 30 33* 33* 29 33*  GLUCOSE 81   < > 98 97 97 137* 89  BUN 27*   < > 18 14 16 13 8    CREATININE 0.77   < > 0.62 0.65 0.79 0.75 0.54*  CALCIUM 8.6*   < > 8.5* 8.6* 8.6* 8.3* 8.2*  MG 2.0  --   --   --   --  2.0 2.1  PHOS 2.4*  --   --   --   --   --   --    < > = values in this interval not displayed.   GFR: Estimated Creatinine Clearance: 82.2 mL/min (Piers Baade) (by C-G formula based on SCr of 0.54 mg/dL (L)). Liver Function Tests: Recent Labs  Lab 11/15/17 0426  AST 26  ALT 24  ALKPHOS 88  BILITOT 0.8  PROT 5.5*  ALBUMIN 2.3*   No results for input(s): LIPASE, AMYLASE in the last 168 hours. No results for input(s): AMMONIA in the last 168 hours. Coagulation Profile: No results for input(s): INR, PROTIME in the last 168 hours. Cardiac Enzymes: Recent Labs  Lab 11/13/17 2059 11/14/17 0245 11/14/17 0703  TROPONINI 0.10* 0.09* 0.10*   BNP (last 3 results) Recent Labs    04/07/17 1632  PROBNP 230.0*   HbA1C: No results for input(s): HGBA1C in the last 72 hours. CBG: Recent Labs  Lab 11/14/17 0643 11/14/17 2132 11/15/17 0609  GLUCAP 130* 115* 72   Lipid Profile: No results for input(s): CHOL, HDL, LDLCALC, TRIG, CHOLHDL, LDLDIRECT in the last 72 hours. Thyroid Function Tests: No results for input(s): TSH, T4TOTAL, FREET4, T3FREE, THYROIDAB in the last 72 hours. Anemia Panel: No results for input(s): VITAMINB12, FOLATE, FERRITIN, TIBC, IRON, RETICCTPCT in the last 72 hours. Sepsis Labs: Recent Labs  Lab 11/08/17 1550 11/08/17 1929 11/09/17 0333  LATICACIDVEN 2.53* 0.99 0.9    Recent Results (from the past 240 hour(s))  Urine culture     Status: Abnormal   Collection Time: 11/08/17  6:00 PM  Result Value Ref Range Status   Specimen Description URINE, RANDOM  Final   Special Requests   Final    NONE Performed at [facility], [place] [address] [place], [place]  [address]    Culture >=100,000 COLONIES/mL ENTEROBACTER CLOACAE (Briyah Wheelwright)  Final   Report Status 11/10/2017 FINAL  Final   Organism ID, Bacteria ENTEROBACTER CLOACAE (Camil Hausmann)  Final       Susceptibility   Enterobacter cloacae - MIC*    CEFAZOLIN >=64 RESISTANT Resistant     CEFTRIAXONE <=1 SENSITIVE Sensitive     CIPROFLOXACIN <=0.25 SENSITIVE Sensitive     GENTAMICIN <=1 SENSITIVE Sensitive     IMIPENEM <=0.25 SENSITIVE Sensitive     NITROFURANTOIN 64 INTERMEDIATE Intermediate     TRIMETH/SULFA <=20 SENSITIVE Sensitive     PIP/TAZO 16 SENSITIVE Sensitive     * >=100,000 COLONIES/mL ENTEROBACTER CLOACAE  MRSA PCR Screening     Status: None   Collection Time: 11/08/17 10:12 PM  Result Value Ref Range Status   MRSA by PCR NEGATIVE NEGATIVE Final    Comment:        The GeneXpert MRSA Assay (FDA approved for NASAL specimens only), is one component of Kendle Erker comprehensive MRSA colonization surveillance program. It is not intended to diagnose MRSA infection nor to guide or monitor treatment for MRSA infections. Performed at [facility], [place] [address] [place], [place] [address]   Urine culture     Status: Abnormal   Collection Time: 11/13/17  3:30 PM  Result Value Ref Range Status   Specimen Description URINE, RANDOM  Final   Special Requests   Final    NONE Performed at [facility], [place] [address] [place], [place] [address]    Culture MULTIPLE SPECIES PRESENT, SUGGEST RECOLLECTION (Jaciel Diem)  Final   Report Status 11/14/2017 FINAL  Final         Radiology Studies: Mr Brain Wo Contrast  Result Date: 11/15/2017 CLINICAL DATA:  Syncope, simple, abnormal neuro exam. Recent hospital admission for lethargy and urinary tract infection. Syncopal episode at skilled nursing facility. EXAM: MRI HEAD WITHOUT CONTRAST TECHNIQUE: Multiplanar, multiecho pulse sequences of the brain and surrounding structures were obtained without intravenous contrast. COMPARISON:  CT head without contrast 10/26/2017 FINDINGS: Brain: Mild generalized atrophy is present. Moderate diffuse periventricular and subcortical T2 hyperintensities are present bilaterally. Ventricles are  proportionate to the degree of atrophy. No significant extra-axial fluid collection is present. The internal auditory canals are within normal limits bilaterally. The brainstem and cerebellum are normal. Vascular: Flow is present in the major intracranial arteries. Skull and upper cervical spine: Skull base is within normal limits. Exaggerated cervical lordosis is present. There is focal central canal stenosis at C3-4 with grade 1 anterolisthesis. Midline sagittal structures are otherwise unremarkable. Sinuses/Orbits: Bilateral maxillary antrostomies are present. No significant residual recurrent sinus disease is present. Small polyps are present within the sphenoid sinuses bilaterally. The mastoid air cells are clear. Globes and orbits are within normal limits. IMPRESSION: 1. No acute intracranial abnormality. 2. Atrophy and white matter changes are moderately advanced for age. The finding is nonspecific but can be seen in the setting of chronic microvascular ischemia, Coby Shrewsberry demyelinating process such as multiple sclerosis, vasculitis, complicated migraine headaches, or as the sequelae of Marilin Kofman prior infectious or inflammatory process. 3. Moderate central canal stenosis at C3-4. 4. Previous sinus surgery. Electronically Signed   By: San Morelle [name]   On: 11/15/2017 11:19   Dg Chest Port 1 View  Result Date: 11/15/2017 CLINICAL DATA:  Hypoxia. EXAM: PORTABLE CHEST 1 VIEW COMPARISON:  Multiple prior plain films including most recently 11/13/2017. CT 11/18/2013. FINDINGS: Mildly degraded exam due to AP portable technique and  patient body habitus. Chin overlies the apices. Midline trachea. Moderate cardiomegaly. Atherosclerosis in the transverse aorta. Right paratracheal soft tissue fullness is similar over multiple prior exams and likely related to right-sided thyroid enlargement. Probable layering bilateral pleural effusions. No pneumothorax. No congestive failure. Bibasilar airspace disease. IMPRESSION:  Similar appearance of layering bilateral pleural effusions and bibasilar atelectasis or infection. Cardiomegaly without congestive failure. Aortic Atherosclerosis (ICD10-I70.0). Electronically Signed   By: Abigail Miyamoto [name]   On: 11/15/2017 14:04   Dg Chest Port 1 View  Result Date: 11/13/2017 CLINICAL DATA:  Bradycardia.  Former smoker. EXAM: PORTABLE CHEST 1 VIEW COMPARISON:  11/11/2017; 10/25/2017; 04/07/2017 FINDINGS: Grossly unchanged enlarged cardiac silhouette and mediastinal contours with atherosclerotic plaque within thoracic aorta. Grossly unchanged small bilateral effusions and associated bibasilar heterogeneous/consolidative opacities, left greater than right. No new focal airspace opacities. Re demonstrated thinning the biapical pulmonary parenchyma however the pulmonary vasculature appears indistinct within mid and lower lungs. No definite pneumothorax, though note, evaluation degraded secondary to patient's overlying chin. No acute osseus abnormalities. IMPRESSION: Similar findings of cardiomegaly and pulmonary edema superimposed on advanced emphysematous change with small bilateral effusions and associated bibasilar opacities, left greater than right, atelectasis versus infiltrate. Electronically Signed   By: Sandi Mariscal [name]   On: 11/13/2017 15:59        Scheduled Meds: . aspirin EC  81 mg Oral Q M,W,F  . B-complex with vitamin C  1 tablet Oral Daily  . cefdinir  300 mg Oral Q12H  . cholecalciferol  4,000 Units Oral Daily  . docusate sodium  100 mg Oral BID  . enoxaparin (LOVENOX) injection  40 mg Subcutaneous Daily  . levothyroxine  100 mcg Oral QAC breakfast  . mometasone-formoterol  2 puff Inhalation BID  . multivitamin with minerals  1 tablet Oral Daily  . pantoprazole  40 mg Oral BID  . potassium chloride  10 mEq Oral Daily  . sodium chloride flush  3 mL Intravenous Q12H  . tiotropium  1 capsule Inhalation Daily   Continuous Infusions:    LOS: 2 days    Time  spent: over [place], MD [facility] Pager [phone]   If 7PM-7AM, please contact night-coverage [email or web address] Password [email or web address] 11/15/2017, 2:22 PM

## 2017-11-15 NOTE — Plan of Care (Signed)
  Problem: Education: Goal: Knowledge of General Education information will improve Outcome: Progressing   Problem: Health Behavior/Discharge Planning: Goal: Ability to manage health-related needs will improve Outcome: Progressing   Problem: Clinical Measurements: Goal: Respiratory complications will improve Outcome: Progressing   Problem: Pain Managment: Goal: General experience of comfort will improve Outcome: Progressing   Problem: Safety: Goal: Ability to remain free from injury will improve Outcome: Progressing   Problem: Education: Goal: Ability to demonstrate management of disease process will improve Outcome: Progressing   Problem: Activity: Goal: Capacity to carry out activities will improve Outcome: Progressing

## 2017-11-15 NOTE — Progress Notes (Signed)
Patient having some nasal congestion.  Is normally on a nasal spray and antihistamine.  Paged MD

## 2017-11-16 ENCOUNTER — Inpatient Hospital Stay (HOSPITAL_COMMUNITY): Payer: Medicare Other

## 2017-11-16 DIAGNOSIS — R609 Edema, unspecified: Secondary | ICD-10-CM

## 2017-11-16 DIAGNOSIS — M79609 Pain in unspecified limb: Secondary | ICD-10-CM

## 2017-11-16 DIAGNOSIS — M7989 Other specified soft tissue disorders: Secondary | ICD-10-CM

## 2017-11-16 LAB — COMPREHENSIVE METABOLIC PANEL
ALBUMIN: 2.3 g/dL — AB (ref 3.5–5.0)
ALT: 23 U/L (ref 17–63)
AST: 28 U/L (ref 15–41)
Alkaline Phosphatase: 93 U/L (ref 38–126)
Anion gap: 7 (ref 5–15)
BUN: 8 mg/dL (ref 6–20)
CHLORIDE: 94 mmol/L — AB (ref 101–111)
CO2: 35 mmol/L — AB (ref 22–32)
Calcium: 8.6 mg/dL — ABNORMAL LOW (ref 8.9–10.3)
Creatinine, Ser: 0.75 mg/dL (ref 0.61–1.24)
GFR calc Af Amer: >60 mL/min (ref >60)
GFR calc non Af Amer: >60 mL/min (ref >60)
Glucose, Bld: 96 mg/dL (ref 65–99)
POTASSIUM: 4.5 mmol/L (ref 3.5–5.1)
SODIUM: 136 mmol/L (ref 135–145)
Total Bilirubin: 0.7 mg/dL (ref 0.3–1.2)
Total Protein: 5.3 g/dL — ABNORMAL LOW (ref 6.5–8.1)

## 2017-11-16 LAB — CBC
HCT: 38.7 % — ABNORMAL LOW (ref 39.0–52.0)
Hemoglobin: 12.5 g/dL — ABNORMAL LOW (ref 13.0–17.0)
MCH: 30.1 pg (ref 26.0–34.0)
MCHC: 32.3 g/dL (ref 30.0–36.0)
MCV: 93.3 fL (ref 78.0–100.0)
PLATELETS: 157 10*3/uL (ref 150–400)
RBC: 4.15 MIL/uL — ABNORMAL LOW (ref 4.22–5.81)
RDW: 13.1 % (ref 11.5–15.5)
WBC: 7.3 10*3/uL (ref 4.0–10.5)

## 2017-11-16 LAB — MAGNESIUM: MAGNESIUM: 2.3 mg/dL (ref 1.7–2.4)

## 2017-11-16 LAB — GLUCOSE, CAPILLARY
GLUCOSE-CAPILLARY: 118 mg/dL — AB (ref 65–99)
Glucose-Capillary: 86 mg/dL (ref 65–99)
Glucose-Capillary: 77 mg/dL (ref 65–99)

## 2017-11-16 MED ORDER — BACITRACIN-NEOMYCIN-POLYMYXIN OINTMENT TUBE
TOPICAL_OINTMENT | Freq: Every day | CUTANEOUS | Status: DC
  Administered 2017-11-16 – 2017-11-22 (×7): via TOPICAL
  Administered 2017-11-23: 1 via TOPICAL
  Administered 2017-11-24: 09:00:00 via TOPICAL
  Filled 2017-11-16 (×2): qty 14

## 2017-11-16 MED ORDER — FUROSEMIDE 40 MG PO TABS
60.0000 mg | ORAL_TABLET | Freq: Every day | ORAL | Status: DC
  Administered 2017-11-17: 60 mg via ORAL
  Filled 2017-11-16: qty 1

## 2017-11-16 NOTE — Progress Notes (Signed)
PROGRESS NOTE    Richard Rosales  IPJ:[id number] DOB: Oct 06, 1930 DOA: 11/13/2017 PCP: Unk Pinto, MD   Brief Narrative:  Richard Rosales is a 84 y.o. male with medical history significant for chronic systolic CHF, atrial fibrillation not on anticoagulation, hypothyroidism, COPD with chronic hypoxic respiratory failure, and UTI and antibiotics, now presenting for evaluation of syncope.  Patient was discharged from the hospital yesterday after management of the lethargy, hypokalemia, and UTI.  His condition was in part attributed to recent diuresis and the patient was hydrated while in the hospital.  He was treated with antibiotics and discharged back to his SNF yesterday to complete a course of [place].  He reports feeling well while at rest, but becomes acutely lightheaded while sitting up or trying to stand.  He had a syncopal episode today while trying to participate in physical therapy.  He denies any chest pain or palpitations and denies shortness of breath, though he is requiring increased supplemental oxygen.  Assessment & Plan:   Principal Problem:   Syncope Active Problems:   OSA and COPD overlap syndrome (HCC)   Essential hypertension   COPD (chronic obstructive pulmonary disease) (HCC)   Hypothyroidism   A-fib (HCC)   Acute on chronic systolic CHF (congestive heart failure) (HCC)   Elevated troponin   UTI (urinary tract infection)   Pressure injury of skin   Orthostatic hypotension   Syncope due to orthostatic hypotension due to hypovolemia or autonomic dysfunction related to polyneuropathy - Presents from SNF after a syncopal episode.  - He was just discharged the day before and has reportedly been orthostatic with reported 50 mmHg drop in SBP on standing at his SNF and he had a syncopal episode while during PT  - Patient denies chest pain, palpitations, or any other complaints  - S/p IVF, will resume lasix today (though if recurrent orthostasis consider every other day  dosing or holding this again).  Continue to hold coreg for now - If recurrent orthostatic hypotension in future, would consider medication like midodrine - Only able to get lying and sitting orthostatics on 5/27 (not able to get standing), but these were negative - Echo done earlier this month with EF 40-45%, diffuse HK, severe LAE, mild AI, mild AS, and mild pulm HTN  - MRI brain notable for atrophy and white matter changes moderately advanced for age  Generalized Weakness:  multifactorial.  Likely some contribution from spinal stenosis, heart failure, and peripheral neuropathy.  Not a great candidate for and surgery for spinal disease.  Had appointment with Dr. [name] as outpatient that he missed as result of his recent hospitalizations.  Discussed again with Dr. 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 [name] note from 12/04/2016.  MRI brain notable for atrophy and white matter changes moderately advance for age - nonspecific finding.  See report.   - His LE strength seems better than on my initial exam, but he's still unable to stand - will need outpatient follow up with neurology and neurosurgery  - continue PT/OT and treatment of other chronic medical conditions  2. Chronic systolic CHF  - CXR with cardiomegaly and pulm edema with small bilateral effusions and L greater than R bibasilar opacities (  CXR stable) - O2 requirement increased, back to baseline today - Lasix held in light of orthostasis with syncope and low BP's  - He is being gently hydrated  - Follow daily wt and I/O's - hold coreg - will restart lasix at lower dose (60 mg daily instead of BID) - Echo done earlier this month with EF 40-45%,  diffuse HK, severe LAE, mild AI, mild AS, and mild pulm HTN    Wt Readings from Last 3 Encounters:  11/16/17 106.1 kg (233 lb 14.5 oz)  11/12/17 106.4 kg (234 lb 9.1 oz)  10/31/17 103 kg (227 lb 1.2 oz)    3. COPD with chronic hypoxic respiratory failure  - No wheezing on admission, has increased O2-requirement but denies SOB or cough  - Continue ICS/LABA, Spiriva, and prn albuterol  - Continue supplemental O2 (generally on 2 L, on 3 today, will ctm and wean back to baseline as able)  4. Atrial fibrillation  - In an atrial arrhythmia on admission  - CHADS-VASc at least 3 (age x2, CHF)  - Follows with cardiology and has declined anticoagulation  - Beta-blocker held on admission in light of low blood pressures   5. Hypothyroidism  - Thyroid studies normal earlier this month  - Continue Synthroid    6. UTI  - Treated with Rocephin during recent hospitalization and transitioned to [facility] on discharge  - No fever on admission  - will d/c omnicef after completes regimen tonight  7. Elevated troponin  - Troponin is elevated to 0.11 in ED (down from previous hospitalization) - Patient denies chest pain or SOB  - Continue cardiac monitoring, trend troponin, hold Coreg in light of hypotension   # Hypokalemia: mild, replete with K in IVF  # Stage 1 decub: with foam dressing on, follow.  Frequent repositioning.  # LE redness: doesn't appear infected, follow.    # Full thickness wound to R great toe: ensure outpatient follow up with ortho.  Seen by wound care.  # LE edema: R>L is chronic, but appeared worse yesterday per daughter.  [place]. Restart lasix.  DVT prophylaxis: lovenox Code Status: DNR Family Communication: wife/daughter at bedside Disposition Plan: pending improvement   Consultants:   none  Procedures:   none  Antimicrobials:  Anti-infectives (From admission, onward)   Start     Dose/Rate Route Frequency Ordered Stop   11/14/17 0030  cefdinir ([name])  capsule 300 mg    Note to Pharmacy:  Patient taking differently: Take 300 mg by mouth two times a day for 3 days     300 mg Oral Every 12 hours 11/13/17 2038 11/16/17 0959      Subjective: Feeling ok. No complaints.   Objective: Vitals:   11/16/17 0501 11/16/17 0540 11/16/17 0814 11/16/17 0818  BP: 132/73 115/72    Pulse: 72 73 69   Resp: 20 19 18    Temp: 97.7 F (36.5 C) 98.3 F (36.8 C)    TempSrc: Oral Oral    SpO2: 100% 94% 98% 98%  Weight:      Height:        Intake/Output Summary (Last 24 hours) at 11/16/2017 0954 Last data filed at 11/15/2017 2213 Gross per 24 hour  Intake 480 ml  Output 980 ml  Net -500 ml   Filed Weights   11/14/17 0638 11/15/17 0410 11/16/17 0445  Weight: 104.7 kg (230 lb 13.2 oz) 105.2 kg (231 lb 14.8 oz) 106.1 kg (233 lb 14.5 oz)    Examination:  General: No acute distress. Cardiovascular: Heart sounds show a regular rate, and rhythm. No gallops or rubs. No murmurs. No JVD. Lungs: Clear to auscultation bilaterally with good air movement. No rales, rhonchi or wheezes. Abdomen: Soft, nontender, nondistended with normal active bowel sounds. No masses. No hepatosplenomegaly. Neurological: Alert and oriented 3. Moves all extremities 4. Cranial nerves II through XII grossly intact. L>R LEE strength, but this has improved since his initial presentation on my exam.  Skin: Warm and dry. No rashes or lesions.   Extremities: No clubbing or cyanosis. No edema. Pedal pulses 2+. Psychiatric: Mood and affect are normal. Insight and judgment are appropriate.   Data Reviewed: I have personally reviewed following labs and imaging studies  CBC: Recent Labs  Lab 11/10/17 0242 11/11/17 0238 11/12/17 0239 11/13/17 1532 11/14/17 0245 11/15/17 0426 11/16/17 0340  WBC 13.2* 11.4* 11.2* 11.6* 10.0 8.1 7.3  NEUTROABS 9.9* 8.0*  --   --   --   --   --   HGB 13.7 13.7 12.9* 13.5 13.2 12.5* 12.5*  HCT 41.2 41.0 39.0 40.9 39.7 38.3* 38.7*  MCV 90.0 89.5  91.1 90.5 92.1 92.5 93.3  PLT 204 181 162 163 138* 144* 419   Basic Metabolic Panel: Recent Labs  Lab 11/12/17 0239 11/13/17 1532 11/14/17 0245 11/15/17 0426 11/16/17 0340  NA 132* 132* 133* 131* 136  K 4.1 3.5 3.4* 4.2 4.5  CL 90* 90* 94* 91* 94*  CO2 33* 33* 29 33* 35*  GLUCOSE 97 97 137* 89 96  BUN 14 16 13 8 8   CREATININE 0.65 0.79 0.75 0.54* 0.75  CALCIUM 8.6* 8.6* 8.3* 8.2* 8.6*  MG  --   --  2.0 2.1 2.3   GFR: Estimated Creatinine Clearance: 82.5 mL/min (by C-G formula based on SCr of 0.75 mg/dL). Liver Function Tests: Recent Labs  Lab 11/15/17 0426 11/16/17 0340  AST 26 28  ALT 24 23  ALKPHOS 88 93  BILITOT 0.8 0.7  PROT 5.5* 5.3*  ALBUMIN 2.3* 2.3*   No results for input(s): LIPASE, AMYLASE in the last 168 hours. No results for input(s): AMMONIA in the last 168 hours. Coagulation Profile: No results for input(s): INR, PROTIME in the last 168 hours. Cardiac Enzymes: Recent Labs  Lab 11/13/17 2059 11/14/17 0245 11/14/17 0703  TROPONINI 0.10* 0.09* 0.10*   BNP (last 3 results) Recent Labs    04/07/17 1632  PROBNP 230.0*   HbA1C: No results for input(s): HGBA1C in the last 72 hours. CBG: Recent Labs  Lab 11/14/17 0643 11/14/17 2132 11/15/17 0609 11/16/17 0531 11/16/17 0731  GLUCAP 130* 115* 72 86 77   Lipid Profile: No results for input(s): CHOL, HDL, LDLCALC, TRIG, CHOLHDL, LDLDIRECT in the last 72 hours. Thyroid Function Tests: No results for input(s): TSH, T4TOTAL, FREET4, T3FREE, THYROIDAB in the last 72 hours. Anemia Panel: No results for input(s): VITAMINB12, FOLATE, FERRITIN, TIBC, IRON, RETICCTPCT in the last 72 hours. Sepsis Labs: No results for input(s): PROCALCITON, LATICACIDVEN in the last 168 hours.  Recent Results (from the past 240 hour(s))  Urine culture     Status: Abnormal   Collection Time: 11/08/17  6:00 PM  Result Value Ref Range Status   Specimen Description URINE, RANDOM  Final   Special Requests   Final     NONE Performed at [facility], [address], [place], [place] [address]    Culture >=100,000 COLONIES/mL ENTEROBACTER CLOACAE (A)  Final   Report Status 11/10/2017 FINAL  Final  Organism ID, Bacteria ENTEROBACTER CLOACAE (A)  Final      Susceptibility   Enterobacter cloacae - MIC*    CEFAZOLIN >=64 RESISTANT Resistant     CEFTRIAXONE <=1 SENSITIVE Sensitive     CIPROFLOXACIN <=0.25 SENSITIVE Sensitive     GENTAMICIN <=1 SENSITIVE Sensitive     IMIPENEM <=0.25 SENSITIVE Sensitive     NITROFURANTOIN 64 INTERMEDIATE Intermediate     TRIMETH/SULFA <=20 SENSITIVE Sensitive     PIP/TAZO 16 SENSITIVE Sensitive     * >=100,000 COLONIES/mL ENTEROBACTER CLOACAE  MRSA PCR Screening     Status: None   Collection Time: 11/08/17 10:12 PM  Result Value Ref Range Status   MRSA by PCR NEGATIVE NEGATIVE Final    Comment:        The GeneXpert MRSA Assay (FDA approved for NASAL specimens only), is one component of a comprehensive MRSA colonization surveillance program. It is not intended to diagnose MRSA infection nor to guide or monitor treatment for MRSA infections. Performed at [facility], [place] [address], [place], [place] [address]   Urine culture     Status: Abnormal   Collection Time: 11/13/17  3:30 PM  Result Value Ref Range Status   Specimen Description URINE, RANDOM  Final   Special Requests   Final    NONE Performed at [facility], [place] [address], [place], [place] [address]    Culture MULTIPLE SPECIES PRESENT, SUGGEST RECOLLECTION (A)  Final   Report Status 11/14/2017 FINAL  Final         Radiology Studies: Mr Brain Wo Contrast  Result Date: 11/15/2017 CLINICAL DATA:  Syncope, simple, abnormal neuro exam. Recent hospital admission for lethargy and urinary tract infection. Syncopal episode at skilled nursing facility. EXAM: MRI HEAD WITHOUT CONTRAST TECHNIQUE: Multiplanar, multiecho pulse sequences of the brain and surrounding structures were obtained  without intravenous contrast. COMPARISON:  CT head without contrast 10/26/2017 FINDINGS: Brain: Mild generalized atrophy is present. Moderate diffuse periventricular and subcortical T2 hyperintensities are present bilaterally. Ventricles are proportionate to the degree of atrophy. No significant extra-axial fluid collection is present. The internal auditory canals are within normal limits bilaterally. The brainstem and cerebellum are normal. Vascular: Flow is present in the major intracranial arteries. Skull and upper cervical spine: Skull base is within normal limits. Exaggerated cervical lordosis is present. There is focal central canal stenosis at C3-4 with grade 1 anterolisthesis. Midline sagittal structures are otherwise unremarkable. Sinuses/Orbits: Bilateral maxillary antrostomies are present. No significant residual recurrent sinus disease is present. Small polyps are present within the sphenoid sinuses bilaterally. The mastoid air cells are clear. Globes and orbits are within normal limits. IMPRESSION: 1. No acute intracranial abnormality. 2. Atrophy and white matter changes are moderately advanced for age. The finding is nonspecific but can be seen in the setting of chronic microvascular ischemia, a demyelinating process such as multiple sclerosis, vasculitis, complicated migraine headaches, or as the sequelae of a prior infectious or inflammatory process. 3. Moderate central canal stenosis at C3-4. 4. Previous sinus surgery. Electronically Signed   By: San Morelle [name]   On: 11/15/2017 11:19   Dg Chest Port 1 View  Result Date: 11/15/2017 CLINICAL DATA:  Hypoxia. EXAM: PORTABLE CHEST 1 VIEW COMPARISON:  Multiple prior plain films including most recently 11/13/2017. CT 11/18/2013. FINDINGS: Mildly degraded exam due to AP portable technique and patient body habitus. Chin overlies the apices. Midline trachea. Moderate cardiomegaly. Atherosclerosis in the transverse aorta. Right paratracheal soft  tissue fullness is  similar over multiple prior exams and likely related to right-sided thyroid enlargement. Probable layering bilateral pleural effusions. No pneumothorax. No congestive failure. Bibasilar airspace disease. IMPRESSION: Similar appearance of layering bilateral pleural effusions and bibasilar atelectasis or infection. Cardiomegaly without congestive failure. Aortic Atherosclerosis (ICD10-I70.0). Electronically Signed   By: Abigail Miyamoto [name]   On: 11/15/2017 14:04        Scheduled Meds: . aspirin EC  81 mg Oral Q M,W,F  . azelastine  1 spray Each Nare BID  . B-complex with vitamin C  1 tablet Oral Daily  . cefdinir  300 mg Oral Q12H  . cholecalciferol  4,000 Units Oral Daily  . docusate sodium  100 mg Oral BID  . enoxaparin (LOVENOX) injection  40 mg Subcutaneous Daily  . levothyroxine  100 mcg Oral QAC breakfast  . mometasone-formoterol  2 puff Inhalation BID  . multivitamin with minerals  1 tablet Oral Daily  . neomycin-bacitracin-polymyxin   Topical Daily  . pantoprazole  40 mg Oral BID  . potassium chloride  10 mEq Oral Daily  . sodium chloride flush  3 mL Intravenous Q12H  . tiotropium  1 capsule Inhalation Daily   Continuous Infusions:    LOS: 3 days    Time spent: over [place], MD [facility] Pager [phone]   If 7PM-7AM, please contact night-coverage [email or web address] Password [facility] 11/16/2017, 9:54 AM

## 2017-11-16 NOTE — Progress Notes (Signed)
Preliminary notes--Bilateral lower extremities venous duplex exam completed. Negative for DVT.  Incidental finding: A 2.06x2.90x1.36cm cystic structure seen at upper calf medially.   Hongying Bernece Gall (RDMS RVT) 11/16/17 4:12 PM

## 2017-11-16 NOTE — Care Management Important Message (Signed)
Important Message  Patient Details  Name: Richard Rosales MRN: [id number] Date of Birth: August 17, 1930   Medicare Important Message Given:  Yes    [name] [place] 11/16/2017, 12:41 PM

## 2017-11-16 NOTE — Plan of Care (Signed)
  Problem: Education: Goal: Knowledge of General Education information will improve Outcome: Progressing   Problem: Health Behavior/Discharge Planning: Goal: Ability to manage health-related needs will improve Outcome: Progressing   Problem: Clinical Measurements: Goal: Will remain free from infection Outcome: Progressing   

## 2017-11-16 NOTE — Progress Notes (Signed)
Placed patient on CPAP via FFM, previous settings (auto 20-5) cm h20 with 3 lpm O2 bleed in. Tolerating well at this time.

## 2017-11-16 NOTE — Consult Note (Addendum)
[facility] Nurse wound consult note Reason for Consult: Consult requested for BLE.  Pt is familiar to [facility] team from recent visit in May.  He states he has been wearing compression stockings prior to admission Wound type: BLE with generalized edema and erythremia.  Patchy areas of dry scabs which lift off easily.  No open wounds or drainage requiring topical treatment. Measurement: Right great toe with a full thickness wound at the tip where the patient states he recently had orthopaedic hardware removed.  Physician has told him to apply antibiotic ointment daily.  .2X.2X.1cm dark reddish brown scabbed area where pin was removed.  No odor, drainage, or fluctuance.  Continue present plan of care with antibiotic ointment and bandaid and pt states he will resume follow-up with the ortho team after discharge. Please re-consult if further assistance is needed.  Thank-you,  Julien Girt MSN, [place], [place], [place], [place]

## 2017-11-17 DIAGNOSIS — R269 Unspecified abnormalities of gait and mobility: Secondary | ICD-10-CM

## 2017-11-17 LAB — BASIC METABOLIC PANEL
ANION GAP: 7 (ref 5–15)
BUN: 8 mg/dL (ref 6–20)
CALCIUM: 8.7 mg/dL — AB (ref 8.9–10.3)
CO2: 33 mmol/L — ABNORMAL HIGH (ref 22–32)
Chloride: 95 mmol/L — ABNORMAL LOW (ref 101–111)
Creatinine, Ser: 0.73 mg/dL (ref 0.61–1.24)
GFR calc Af Amer: >60 mL/min (ref >60)
GLUCOSE: 92 mg/dL (ref 65–99)
Potassium: 4.4 mmol/L (ref 3.5–5.1)
Sodium: 135 mmol/L (ref 135–145)

## 2017-11-17 LAB — CBC
HCT: 39.9 % (ref 39.0–52.0)
Hemoglobin: 13 g/dL (ref 13.0–17.0)
MCH: 30.3 pg (ref 26.0–34.0)
MCHC: 32.6 g/dL (ref 30.0–36.0)
MCV: 93 fL (ref 78.0–100.0)
PLATELETS: 192 10*3/uL (ref 150–400)
RBC: 4.29 MIL/uL (ref 4.22–5.81)
RDW: 13.2 % (ref 11.5–15.5)
WBC: 5.6 10*3/uL (ref 4.0–10.5)

## 2017-11-17 LAB — VITAMIN B12: VITAMIN B 12: 835 pg/mL (ref 180–914)

## 2017-11-17 LAB — GLUCOSE, CAPILLARY: Glucose-Capillary: 83 mg/dL (ref 65–99)

## 2017-11-17 LAB — MAGNESIUM: Magnesium: 2.1 mg/dL (ref 1.7–2.4)

## 2017-11-17 MED ORDER — DILTIAZEM HCL 30 MG PO TABS: 30.0000 mg | ORAL_TABLET | Freq: Four times a day (QID) | ORAL | Status: DC

## 2017-11-17 MED ORDER — METOPROLOL TARTRATE 12.5 MG HALF TABLET
12.5000 mg | ORAL_TABLET | Freq: Two times a day (BID) | ORAL | Status: DC
  Administered 2017-11-17 – 2017-11-24 (×11): 12.5 mg via ORAL
  Filled 2017-11-17 (×14): qty 1

## 2017-11-17 MED ORDER — FUROSEMIDE 40 MG PO TABS
60.0000 mg | ORAL_TABLET | ORAL | Status: DC
  Administered 2017-11-19: 60 mg via ORAL
  Filled 2017-11-17: qty 1

## 2017-11-17 NOTE — Progress Notes (Addendum)
PROGRESS NOTE    Richard Rosales  VFI:[id number] DOB: August 10, 1930 DOA: 11/13/2017 PCP: Unk Pinto, MD   Brief Narrative:  Richard Rosales is a 84 y.o. male with medical history significant for chronic systolic CHF, atrial fibrillation not on anticoagulation, hypothyroidism, COPD with chronic hypoxic respiratory failure, and UTI and antibiotics, now presenting for evaluation of syncope.  Patient was discharged from the hospital yesterday after management of the lethargy, hypokalemia, and UTI.  His condition was in part attributed to recent diuresis and the patient was hydrated while in the hospital.  He was treated with antibiotics and discharged back to his SNF yesterday to complete a course of [place].  He reports feeling well while at rest, but becomes acutely lightheaded while sitting up or trying to stand.  He had a syncopal episode today while trying to participate in physical therapy.  He denies any chest pain or palpitations and denies shortness of breath, though he is requiring increased supplemental oxygen.  Presented with syncope 2/2 orthostatic hypotension thought 2/2 hypovolemia from diuresis vs autonomic dysfunction from peripheral neuropathy.  Has LE weakness likely related to spinal stenosis and peripheral neuropathy.  Neurology asked to evaluate on 5/28, recommending MRI.  Hopefully after this w/u complete, maybe able to get him back to [facility] on 5/29.  Assessment & Plan:   Principal Problem:   Syncope Active Problems:   OSA and COPD overlap syndrome (HCC)   Essential hypertension   COPD (chronic obstructive pulmonary disease) (HCC)   Hypothyroidism   A-fib (HCC)   Acute on chronic systolic CHF (congestive heart failure) (HCC)   Elevated troponin   UTI (urinary tract infection)   Pressure injury of skin   Orthostatic hypotension   Syncope due to orthostatic hypotension due to hypovolemia or autonomic dysfunction related to polyneuropathy - Presents from SNF after a  syncopal episode.  - He was just discharged the day before and has reportedly been orthostatic with reported 50 mmHg drop in SBP on standing at his SNF and he had a syncopal episode while during PT  - Patient denies chest pain, palpitations, or any other complaints  - Initially had lasix and coreg held and was hydrated - Will dose lasix every other day.  Will restart metop instead of coreg (now with some RVR) with hope that this will affect his BP less    - If recurrent orthostatic hypotension in future, would adjust above medications further, but also consider adding medication like midodrine - Only able to get lying and sitting orthostatics on 5/27 (not able to get standing), but these were negative - Echo done earlier this month with EF 40-45%, diffuse HK, severe LAE, mild AI, mild AS, and mild pulm HTN  - MRI brain notable for atrophy and white matter changes moderately advanced for age  Generalized Weakness:  Multifactorial.  Likely some contribution from spinal stenosis, heart failure, and peripheral neuropathy.  Not a great candidate for and surgery for spinal disease.  Had appointment with Dr. [name] as outpatient that he missed as result of his recent hospitalizations.  Discussed again with Dr. 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 [name] note from 12/04/2016.  MRI brain notable for atrophy and white matter  changes moderately advance for age - nonspecific finding (See report)  - Still with R>L LE weakness today, but also noticed some involuntary jerking of his LE which I hadn't noticed before, I discussed with neurology and officially consulted given overall picture - will need outpatient follow up with  neurology and neurosurgery  - continue PT/OT and treatment of other chronic medical conditions - repeat spep/immunofixation pending, follow  2. Chronic systolic CHF  - CXR with cardiomegaly and pulm edema with small bilateral effusions and L greater than R bibasilar opacities (CXR stable) - Will dose lasix every other day - Restart metop instead of coreg, hopefully this spares BP at bit - Follow daily wt and I/O's - Echo done earlier this month with EF 40-45%, diffuse HK, severe LAE, mild AI, mild AS, and mild pulm HTN    Wt Readings from Last 3 Encounters:  11/17/17 107.2 kg (236 lb 5.3 oz)  11/12/17 106.4 kg (234 lb 9.1 oz)  10/31/17 103 kg (227 lb 1.2 oz)    3. COPD with chronic hypoxic respiratory failure  - No wheezing on admission, has increased O2-requirement but denies SOB or cough  - Continue ICS/LABA, Spiriva, and prn albuterol  - Continue supplemental O2 (generally on 2 L, currently at baseline)  4. Atrial fibrillation  RVR  - Some RVR noticed yesterday and today.  Will metoprolol instead of coreg which will hopefully spare his BP a bit - CHADS-VASc at least 3 (age x2, CHF)  - Follows with cardiology and has declined anticoagulation    5. Hypothyroidism  - Thyroid studies normal earlier this month  - Continue Synthroid    6. UTI  - Treated with Rocephin during recent hospitalization and transitioned to [facility] on discharge  - No fever on admission  - Completed course of omnicef  7. Elevated troponin  - Troponin is elevated to 0.11 in ED (down from previous hospitalization) - Patient denies chest pain or SOB  - Continue cardiac monitoring, trend troponin, hold Coreg in light of hypotension   # Hypokalemia: mild, replete with K in IVF  # Stage 1 decub: with foam dressing on, follow.  Frequent repositioning.  # LE redness: doesn't appear infected, follow.    # Full thickness wound to R great toe: ensure outpatient follow up with ortho.  Seen by wound  care.  # LE edema: R>L is chronic, but appeared worse yesterday per daughter.  [place] (without DVT - cystic structure noted in upper calf medially on left). Restart lasix.  DVT prophylaxis: lovenox Code Status: DNR Family Communication: wife/daughter at bedside Disposition Plan: pending improvement   Consultants:   none  Procedures:  LE [place] Final Interpretation: Right: There is no evidence of deep vein thrombosis in the lower extremity. No cystic structure found in the popliteal fossa. Left: There is no evidence of deep vein thrombosis in the lower extremity. However, portions of this examination were limited- see technologist comments above. No cystic structure found in the popliteal fossa.  Antimicrobials:  Anti-infectives (From admission, onward)   Start     Dose/Rate Route Frequency Ordered Stop   11/14/17 0030  cefdinir ([name]) capsule 300 mg    Note to Pharmacy:  Patient taking differently: Take 300 mg by mouth two times a day for 3 days     300 mg Oral Every 12 hours 11/13/17 2038 11/16/17 0959      Subjective: Feeling better.  Ready to go.   Objective: Vitals:   11/17/17 0962 11/17/17 8366  11/17/17 0838 11/17/17 1343  BP:   93/73 115/79  Pulse: 67  66 (!) 113  Resp: 18  18 20   Temp:   98.7 F (37.1 C) 98.5 F (36.9 C)  TempSrc:   Oral Oral  SpO2: 94% 94% 100% 94%  Weight:      Height:        Intake/Output Summary (Last 24 hours) at 11/17/2017 1732 Last data filed at 11/17/2017 0500 Gross per 24 hour  Intake 240 ml  Output 1300 ml  Net -1060 ml   Filed Weights   11/15/17 0410 11/16/17 0445 11/17/17 0533  Weight: 105.2 kg (231 lb 14.8 oz) 106.1 kg (233 lb 14.5 oz) 107.2 kg (236 lb 5.3 oz)    Examination:  General: No acute distress. Cardiovascular: Heart sounds show a regular rate, and rhythm. No gallops or rubs. No murmurs. No JVD. Lungs: Clear to auscultation bilaterally with good air movement. No rales, rhonchi or wheezes. Abdomen: Soft, nontender,  nondistended with normal active bowel sounds. No masses. No hepatosplenomegaly. Neurological: Alert and oriented 3. R>L weakness of LE. Cranial nerves II through XII intact. Skin: Warm and dry. No rashes or lesions. Extremities: No clubbing or cyanosis. No edema. Pedal pulses 2+. Psychiatric: Mood and affect are normal. Insight and judgment are appropriate.    Data Reviewed: I have personally reviewed following labs and imaging studies  CBC: Recent Labs  Lab 11/11/17 0238  11/13/17 1532 11/14/17 0245 11/15/17 0426 11/16/17 0340 11/17/17 0609  WBC 11.4*   < > 11.6* 10.0 8.1 7.3 5.6  NEUTROABS 8.0*  --   --   --   --   --   --   HGB 13.7   < > 13.5 13.2 12.5* 12.5* 13.0  HCT 41.0   < > 40.9 39.7 38.3* 38.7* 39.9  MCV 89.5   < > 90.5 92.1 92.5 93.3 93.0  PLT 181   < > 163 138* 144* 157 192   < > = values in this interval not displayed.   Basic Metabolic Panel: Recent Labs  Lab 11/13/17 1532 11/14/17 0245 11/15/17 0426 11/16/17 0340 11/17/17 0609  NA 132* 133* 131* 136 135  K 3.5 3.4* 4.2 4.5 4.4  CL 90* 94* 91* 94* 95*  CO2 33* 29 33* 35* 33*  GLUCOSE 97 137* 89 96 92  BUN 16 13 8 8 8   CREATININE 0.79 0.75 0.54* 0.75 0.73  CALCIUM 8.6* 8.3* 8.2* 8.6* 8.7*  MG  --  2.0 2.1 2.3 2.1   GFR: Estimated Creatinine Clearance: 82.9 mL/min (by C-G formula based on SCr of 0.73 mg/dL). Liver Function Tests: Recent Labs  Lab 11/15/17 0426 11/16/17 0340  AST 26 28  ALT 24 23  ALKPHOS 88 93  BILITOT 0.8 0.7  PROT 5.5* 5.3*  ALBUMIN 2.3* 2.3*   No results for input(s): LIPASE, AMYLASE in the last 168 hours. No results for input(s): AMMONIA in the last 168 hours. Coagulation Profile: No results for input(s): INR, PROTIME in the last 168 hours. Cardiac Enzymes: Recent Labs  Lab 11/13/17 2059 11/14/17 0245 11/14/17 0703  TROPONINI 0.10* 0.09* 0.10*   BNP (last 3 results) Recent Labs    04/07/17 1632  PROBNP 230.0*   HbA1C: No results for input(s): HGBA1C in  the last 72 hours. CBG: Recent Labs  Lab 11/15/17 0609 11/16/17 0531 11/16/17 0731 11/16/17 1708 11/17/17 0551  GLUCAP 72 86 77 118* 83   Lipid Profile: No results for input(s): CHOL, HDL, LDLCALC, TRIG,  CHOLHDL, LDLDIRECT in the last 72 hours. Thyroid Function Tests: No results for input(s): TSH, T4TOTAL, FREET4, T3FREE, THYROIDAB in the last 72 hours. Anemia Panel: Recent Labs    11/17/17 1519  VITAMINB12 835   Sepsis Labs: No results for input(s): PROCALCITON, LATICACIDVEN in the last 168 hours.  Recent Results (from the past 240 hour(s))  Urine culture     Status: Abnormal   Collection Time: 11/08/17  6:00 PM  Result Value Ref Range Status   Specimen Description URINE, RANDOM  Final   Special Requests   Final    NONE Performed at [facility], [address] [place], [place] [address]    Culture >=100,000 COLONIES/mL ENTEROBACTER CLOACAE (A)  Final   Report Status 11/10/2017 FINAL  Final   Organism ID, Bacteria ENTEROBACTER CLOACAE (A)  Final      Susceptibility   Enterobacter cloacae - MIC*    CEFAZOLIN >=64 RESISTANT Resistant     CEFTRIAXONE <=1 SENSITIVE Sensitive     CIPROFLOXACIN <=0.25 SENSITIVE Sensitive     GENTAMICIN <=1 SENSITIVE Sensitive     IMIPENEM <=0.25 SENSITIVE Sensitive     NITROFURANTOIN 64 INTERMEDIATE Intermediate     TRIMETH/SULFA <=20 SENSITIVE Sensitive     PIP/TAZO 16 SENSITIVE Sensitive     * >=100,000 COLONIES/mL ENTEROBACTER CLOACAE  MRSA PCR Screening     Status: None   Collection Time: 11/08/17 10:12 PM  Result Value Ref Range Status   MRSA by PCR NEGATIVE NEGATIVE Final    Comment:        The GeneXpert MRSA Assay (FDA approved for NASAL specimens only), is one component of a comprehensive MRSA colonization surveillance program. It is not intended to diagnose MRSA infection nor to guide or monitor treatment for MRSA infections. Performed at [facility], [place] [address], [place], [place] [address]   Urine  culture     Status: Abnormal   Collection Time: 11/13/17  3:30 PM  Result Value Ref Range Status   Specimen Description URINE, RANDOM  Final   Special Requests   Final    NONE Performed at [facility], [place] [address], [place], [place] [address]    Culture MULTIPLE SPECIES PRESENT, SUGGEST RECOLLECTION (A)  Final   Report Status 11/14/2017 FINAL  Final         Radiology Studies: No results found.      Scheduled Meds: . aspirin EC  81 mg Oral Q M,W,F  . azelastine  1 spray Each Nare BID  . B-complex with vitamin C  1 tablet Oral Daily  . cholecalciferol  4,000 Units Oral Daily  . docusate sodium  100 mg Oral BID  . enoxaparin (LOVENOX) injection  40 mg Subcutaneous Daily  . [START ON 11/19/2017] furosemide  60 mg Oral QODAY  . levothyroxine  100 mcg Oral QAC breakfast  . mometasone-formoterol  2 puff Inhalation BID  . multivitamin with minerals  1 tablet Oral Daily  . neomycin-bacitracin-polymyxin   Topical Daily  . pantoprazole  40 mg Oral BID  . potassium chloride  10 mEq Oral Daily  . sodium chloride flush  3 mL Intravenous Q12H  . tiotropium  1 capsule Inhalation Daily   Continuous Infusions:    LOS: 4 days    Time spent: over [place], MD [facility] Pager [phone]   If 7PM-7AM, please contact night-coverage [email or web address] Password [facility] 11/17/2017, 5:32 PM

## 2017-11-17 NOTE — Care Management Note (Signed)
Case Management Note  Patient Details  Name: Richard Rosales MRN: [id number] Date of Birth: [birth year]  Subjective/Objective:  Admitted for Syncope.                 Action/Plan: Prior to admission patient resided at [facility].  Once patient is medically stable for discharge and bed available plan to discharge to SNF, per CSW  arrangements. NCM will continue to follow how patient progresses.  Expected Discharge Date:   To be determined               Expected Discharge Plan:  [place]  In-House Referral:  Clinical Social Work  Discharge planning Services  CM Consult  Status of Service:  Completed, signed off  Kristen Cardinal, RN 11/17/2017, 1:42 PM

## 2017-11-17 NOTE — Clinical Social Work Note (Signed)
Clinical Social Work Assessment  Patient Details  Name: Richard Rosales MRN: [id number] Date of Birth: 1930-12-06  Date of referral:  11/17/17               Reason for consult:  Discharge Planning                Permission sought to share information with:  Chartered certified accountant granted to share information::  Yes, Verbal Permission Granted  Name::        Agency::  [facility]  Relationship::     Contact Information:     Housing/Transportation Living arrangements for the past 2 months:  [place], [place] of Information:  Medical Team, Adult Children Patient Interpreter Needed:  None Criminal Activity/Legal Involvement Pertinent to Current Situation/Hospitalization:  No - Comment as needed Significant Relationships:  Adult Children, Spouse Lives with:  Spouse, Facility Resident Do you feel safe going back to the place where you live?  Yes Need for family participation in patient care:  Yes (Comment)  Care giving concerns:  CSW has had patient several times in the past 2 months as patient has had several readmissions. Patient has support from wife and adult children.   Social Worker assessment / plan:  CSW spoke to patients son Richard Rosales via phone. Richard Rosales stated he would like patient to return back to rehab ([facility]). Richard Rosales stated that him and family are about to step out for the rest of the day but would like CSW to keep them in the loop if patient discharges.  CSW reached out to admission coordinator Richard Rosales from [facility]. Richard Rosales stated they are able to take patient back once medically cleared by MD. Richard Rosales stated they will not need a new FL2 as nothing major has changed in patients status  Employment status:  Retired Forensic scientist:  Medicare PT Recommendations:  Not assessed at this time Information / Referral to community resources:  [place]  Patient/Family's Response to care: Family  supportive of patients needs. Family wanting patients to progress well so he is able to be back home with wife   Patient/Family's Understanding of and Emotional Response to Diagnosis, Current Treatment, and Prognosis:  Patient will return back to [facility] via [facility] once cleared by MD Emotional Assessment Appearance:  Appears stated age Attitude/Demeanor/Rapport:  Engaged, Gracious Affect (typically observed):  Accepting, Appropriate, Calm, Pleasant Orientation:  Oriented to Self, Oriented to Place, Oriented to  Time, Oriented to Situation Alcohol / Substance use:  Never Used Psych involvement (Current and /or in the community):  No (Comment)  Discharge Needs  Concerns to be addressed:  Care Coordination Readmission within the last 30 days:  Yes Current discharge risk:  Dependent with Mobility Barriers to Discharge:  Continued Medical Work up   [employer], LCSW 11/17/2017, 12:14 PM

## 2017-11-17 NOTE — Consult Note (Signed)
   [facility] Inpatient Consult   11/17/2017  Richard Rosales May 20, 1931 [id number]  Patient screened for re-hospitalization after being at SNF x 1 day (11/13/17). He was hospitalized 11/08/17 thru 11/12/17 after being treated or UTI.   Admitted with bradycardia per MD notes he reports feeling well while at rest, but becomes acutely lightheaded while sitting up or trying to stand.  He had a syncopal episode while trying to participate in physical therapy at the skilled nursing facility. Will follow for disposition and needs in the [place] Management.   Patient is in the [place] [facility] the [place] Management services under patient's Medicare  plan.   Please place a [facility] Care Management consult or for questions contact:   Natividad Brood, RN BSN [place] [facility] Liaison  [phone] business mobile phone Toll free office [phone]

## 2017-11-17 NOTE — Consult Note (Signed)
Neurology Consultation  Reason for Consult:bilateral lower leg weakness Referring Physician: Dr. Florene Glen  CC: Hospitalized for syncope  History is obtained from: patient  HPI: Richard Rosales is a 84 y.o. male  With PMH significant for COPD, Severe Neuropathy, Afib, aortic insufficiency, and venous insufficiency presented to ED from SNF after an syncopal episode. Neurology consulted for Bilateral lower leg weakness.  Patient has had prolonged gait and lower extremity issues; however on may  may 1st he has been having trouble  Walking and standing due to bilateral lower leg weakness. Patient denies worsening SOB. Patient has been seen previously by Dr. Felecia Shelling a year ago for lower extremities weakness and numbness which showed significant NCV consisting of long fiber. He has been having peripheral neuropathy for a prolonged period of time and also has severe edema. He takes 80 mg lasix per day for edema. At this point he is w/c bound due to inability to standup and multiple falls. He states he would like to return to Dr. Felecia Shelling d/t miscommunications he is unable to go back to that practice. D/w patient severe lumbar stenosis. While in hospital planned to have MRI of sacral spine ; however patient out right refused.   ROS: A 14 point ROS was performed and is negative except as noted in the HPI.  Unable to obtain due to altered mental status.   Past Medical History:  Diagnosis Date  . Acute bronchitis   . Adenomatous colon polyp   . Allergic rhinitis   . Anemia    iron deficient  . Atrial fibrillation ([place])   . Atrial flutter ([place])   . Chronic gastritis   . COPD (chronic obstructive pulmonary disease) ([place])   . Diastolic dysfunction   . Diverticulosis of colon    recurrent GI bleeding  . DJD (degenerative joint disease)   . GERD (gastroesophageal reflux disease)   . GI bleed   . HTN (hypertension)   . Hyperplastic colon polyp 2003  . Hypothyroidism   . Moderate aortic insufficiency   .  OSA (obstructive sleep apnea)    compliants w/ CPAP  . Positive PPD    remote  . Shortness of breath   . Venous insufficiency      Family History  Problem Relation Age of Onset  . Hypertension Mother   . Diabetes Father   . Heart failure Father   . Arrhythmia Sister 67  . Lung cancer Brother        chemical     Social History:  reports that he quit smoking about 39 years ago. His smoking use included cigarettes. He has a 20.00 pack-year smoking history. He has never used smokeless tobacco. He reports that he drinks alcohol. He reports that he does not use drugs.   Exam: Current vital signs: BP 93/73 (BP Location: Left Arm)   Pulse 66   Temp 98.7 F (37.1 C) (Oral)   Resp 18   Ht 5\' 9"  (1.753 m)   Wt 107.2 kg (236 lb 5.3 oz)   SpO2 100%   BMI 34.90 kg/m  Vital signs in last 24 hours: Temp:  [97.2 F (36.2 C)-98.7 F (37.1 C)] 98.7 F (37.1 C) (05/28 0838) Pulse Rate:  [66-104] 66 (05/28 0838) Resp:  [16-18] 18 (05/28 0838) BP: (93-154)/(73-95) 93/73 (05/28 0838) SpO2:  [92 %-100 %] 100 % (05/28 0838) FiO2 (%):  [28 %] 28 % (05/28 0728) Weight:  [107.2 kg (236 lb 5.3 oz)] 107.2 kg (236 lb 5.3 oz) (05/28  [place].Precise)   Physical Exam  Constitutional: Appears well-developed and well-nourished.  Psych: Affect appropriate to situation Eyes: Normal sclera and conjunctiva HENT: No OP obstrucion Head: Normocephalic.  Cardiovascular: Normal rate and regular rhythm. Edema in bilateral lower extremities  Respiratory: appeared to be SOB after moving to chair with PT on 2L [place]. Breathing did improve after resting in chair.  Skin: WD bilateral lower extremities with hemosiderin staining  Neuro: Mental Status: Patient is awake, alert, oriented to person, place, month, year, and situation. Patient is able to give a clear and coherent history. No signs of aphasia or neglect Cranial Nerves: II: Visual Fields are full. Pupils are equal, round, and reactive to light.  III,IV, VI:  EOMI without ptosis or diploplia.  V: Facial sensation is symmetric to light touch and  temperature VII: Facial movement is symmetric.  VIII: hearing is intact to voice X: Uvula elevates symmetrically XI: Shoulder shrug is symmetric. XII: tongue is midline without atrophy or fasciculations.   Motor: Tone is normal. Bulk is normal. Strength is equal in bilateral arms. Bilateral proximal weakness greater than distal with left leg weaker than right.  Patient has bilateral RTC tears causing him inability to raise arms proximal; however distal strength 5/5 Sensory: Sensation is symmetric, he has market decreased to vibration in the toes, mildly decreased to the ankles and intact at the knees.  Temperature sensation is relatively preserved. Deep Tendon Reflexes: 2+ and symmetric in the biceps  muted patellae reflexes. Plantars: Toes are downgoing on left and Up going on right Cerebellar: No dysmetria noted with FNF; however difficult to obtain d/t bilateral shoulder RTC injury.   I have reviewed labs in epic and the results pertinent to this consultation are: BMP-unremarkable [id number]  I have reviewed the images obtained:  MRI brain:  1. No acute intracranial abnormality. Moderate central canal stenosis at C3-4. Atrophy and white matter changes are moderately advanced for age.The finding is nonspecific but can be seen in the setting of chronic microvascular ischemia, a demyelinating process such as multiple sclerosis, vasculitis, complicated migraine headaches, or as the sequelae of a prior infectious or inflammatory process. Previous sinus surgery  Impression:  Richard Rosales is a 84 y.o. male  With PMH significant for COPD, Severe Neuropathy, Afib, aortic insufficiency, and venous insufficiency presented to ED from SNF after an syncopal episode. Patient with acute on chronic lower extremities weakness.   Laurey Morale, MSN, NP-C [facility] [phone]  I have seen  the patient reviewed the above note.  His weakness is proximal greater than distal, it is possible that this is related to his lumbar disease if he had mild worsening, it could present as him finally not being able to walk with his walker.  He apparently has been evaluated and was deemed not to be a surgical candidate.  With an upgoing toe, I do have concern that there may be another spinal lesion higher, but he is refusing MRI currently.  I do think another evaluation of his neuropathy as an outpatient may be useful.  Also, with conservative measures such as compression stockings, etc. for treatment of his orthostatic hypotension may be helpful also.   Recommendations: 1) MRI of spine if the patient agrees 2) continue PT and OT 3) Out patient referral to [facility] 4) please call with any further questions or concerns.  Roland Rack, MD [facility] [phone]  If 7pm- 7am, please page neurology on call as listed in [place].

## 2017-11-17 NOTE — Progress Notes (Signed)
Physical Therapy Treatment Patient Details Name: Richard Rosales MRN: [id number] DOB: 09-23-1930 Today's Date: 11/17/2017    History of Present Illness 83 y.o. male admitted 11/13/17 after syncopal event at [facility]. Pt was recently d/c from hospital 5/23 for management of lethargy, hypokalemia, and UTI. PMH includes: systolic CHF, a fib not on anticoagulation, COPD with chronic hypoxic respiratory failure and UTI. Patients BP was 81/47 at ED arrival. Chest Xray notable for cardiomegaly with pulmonary edema superimposed on emphysematous changes.     PT Comments    Pt very deconditioned requiring maxAx2 for all mobility. Pt with onset of SOB with activity. SPO2 >95% on 2LO2 via [place]. Pt transfer to chair using the stedy. Acute PT to con't to follow.   Follow Up Recommendations  SNF;Supervision/Assistance - 24 hour     Equipment Recommendations  None recommended by PT    Recommendations for Other Services OT consult     Precautions / Restrictions Precautions Precautions: Fall Precaution Comments: O2 dep Restrictions Weight Bearing Restrictions: No    Mobility  Bed Mobility Overal bed mobility: Needs Assistance Bed Mobility: Supine to Sit Rolling: Max assist;+2 for safety/equipment         General bed mobility comments: pt able to initiate LEs to EOB, maxAx2 for trunk elevation and to scoot to EOB to place feet on the floor  Transfers Overall transfer level: Needs assistance   Transfers: Sit to/from Stand;Stand Pivot Transfers Sit to Stand: Max assist;+2 physical assistance Stand pivot transfers: Max assist;+2 physical assistance       General transfer comment: pt required maxAx2 via bed pad and gait belt to pull up into standing in stedy. pt then pivoted with stedy to chair, pt with SOB with activity, SpO2 >95% on 2LO2 via [place]  Ambulation/Gait             General Gait Details: unable   Stairs             Wheelchair Mobility    Modified Rankin (Stroke  Patients Only)       Balance Overall balance assessment: Needs assistance Sitting-balance support: No upper extremity supported;Feet supported Sitting balance-Leahy Scale: Fair Sitting balance - Comments: Able to maintain sitting at EOB. fatigues   Standing balance support: Bilateral upper extremity supported;During functional activity Standing balance-Leahy Scale: Poor Standing balance comment: unable to stand without physical assist                            Cognition Arousal/Alertness: Awake/alert Behavior During Therapy: WFL for tasks assessed/performed Overall Cognitive Status: No family/caregiver present to determine baseline cognitive functioning                                 General Comments: 2 questionable historian, pt able to follow commands however inconsistant history      Exercises      General Comments General comments (skin integrity, edema, etc.): VSS      Pertinent Vitals/Pain Pain Assessment: No/denies pain    Home Living                      Prior Function            PT Goals (current goals can now be found in the care plan section) Progress towards PT goals: Progressing toward goals    Frequency    Min 2X/week  PT Plan Current plan remains appropriate    Co-evaluation              AM-PAC PT "6 Clicks" Daily Activity  Outcome Measure  Difficulty turning over in bed (including adjusting bedclothes, sheets and blankets)?: Unable Difficulty moving from lying on back to sitting on the side of the bed? : Unable Difficulty sitting down on and standing up from a chair with arms (e.g., wheelchair, bedside commode, etc,.)?: Unable Help needed moving to and from a bed to chair (including a wheelchair)?: Total Help needed walking in hospital room?: Total Help needed climbing 3-5 steps with a railing? : Total 6 Click Score: 6    End of Session Equipment Utilized During Treatment: Gait  belt;Oxygen Activity Tolerance: Patient tolerated treatment well Patient left: with call bell/phone within reach;with family/visitor present;in chair Nurse Communication: Mobility status PT Visit Diagnosis: Muscle weakness (generalized) (M62.81);Difficulty in walking, not elsewhere classified (R26.2);Unsteadiness on feet (R26.81);Other abnormalities of gait and mobility (R26.89)     Time: 2035-5974 PT Time Calculation (min) (ACUTE ONLY): 26 min  Charges:  $Therapeutic Activity: 23-37 mins                    G Codes:       Kittie Plater, PT, DPT Pager #: [phone] Office #: [phone]    [place] 11/17/2017, 1:36 PM

## 2017-11-18 ENCOUNTER — Inpatient Hospital Stay (HOSPITAL_COMMUNITY): Payer: Medicare Other

## 2017-11-18 DIAGNOSIS — K922 Gastrointestinal hemorrhage, unspecified: Secondary | ICD-10-CM | POA: Diagnosis not present

## 2017-11-18 LAB — CBC
HEMATOCRIT: 31.8 % — AB (ref 39.0–52.0)
Hemoglobin: 10.4 g/dL — ABNORMAL LOW (ref 13.0–17.0)
MCH: 30 pg (ref 26.0–34.0)
MCHC: 32.7 g/dL (ref 30.0–36.0)
MCV: 91.6 fL (ref 78.0–100.0)
PLATELETS: 196 10*3/uL (ref 150–400)
RBC: 3.47 MIL/uL — ABNORMAL LOW (ref 4.22–5.81)
RDW: 13.2 % (ref 11.5–15.5)
WBC: 8.8 10*3/uL (ref 4.0–10.5)

## 2017-11-18 LAB — BASIC METABOLIC PANEL
ANION GAP: 10 (ref 5–15)
BUN: 13 mg/dL (ref 6–20)
CALCIUM: 8.2 mg/dL — AB (ref 8.9–10.3)
CO2: 28 mmol/L (ref 22–32)
Chloride: 95 mmol/L — ABNORMAL LOW (ref 101–111)
Creatinine, Ser: 0.77 mg/dL (ref 0.61–1.24)
Glucose, Bld: 89 mg/dL (ref 65–99)
Potassium: 4.5 mmol/L (ref 3.5–5.1)
Sodium: 133 mmol/L — ABNORMAL LOW (ref 135–145)

## 2017-11-18 LAB — OCCULT BLOOD X 1 CARD TO LAB, STOOL: FECAL OCCULT BLD: POSITIVE — AB

## 2017-11-18 LAB — HEMOGLOBIN AND HEMATOCRIT, BLOOD
HCT: 29.6 % — ABNORMAL LOW (ref 39.0–52.0)
Hemoglobin: 9.8 g/dL — ABNORMAL LOW (ref 13.0–17.0)

## 2017-11-18 LAB — GLUCOSE, CAPILLARY: GLUCOSE-CAPILLARY: 86 mg/dL (ref 65–99)

## 2017-11-18 LAB — MAGNESIUM: Magnesium: 1.9 mg/dL (ref 1.7–2.4)

## 2017-11-18 MED ORDER — TECHNETIUM TC 99M-LABELED RED BLOOD CELLS IV KIT
25.0000 | PACK | Freq: Once | INTRAVENOUS | Status: AC | PRN
  Administered 2017-11-18: 25 via INTRAVENOUS

## 2017-11-18 MED ORDER — DIAZEPAM 5 MG PO TABS
5.0000 mg | ORAL_TABLET | Freq: Two times a day (BID) | ORAL | Status: DC | PRN
  Administered 2017-11-22: 5 mg via ORAL
  Filled 2017-11-18: qty 1

## 2017-11-18 MED ORDER — LORAZEPAM 1 MG PO TABS
1.0000 mg | ORAL_TABLET | Freq: Once | ORAL | Status: AC
  Administered 2017-11-18: 1 mg via ORAL
  Filled 2017-11-18: qty 1

## 2017-11-18 MED ORDER — PANTOPRAZOLE SODIUM 40 MG PO TBEC
40.0000 mg | DELAYED_RELEASE_TABLET | Freq: Every day | ORAL | Status: DC
  Administered 2017-11-19 – 2017-11-24 (×6): 40 mg via ORAL
  Filled 2017-11-18 (×6): qty 1

## 2017-11-18 MED ORDER — DILTIAZEM HCL 30 MG PO TABS
30.0000 mg | ORAL_TABLET | Freq: Three times a day (TID) | ORAL | Status: DC
Start: 2017-11-18 — End: 2017-11-21
  Administered 2017-11-18 – 2017-11-20 (×7): 30 mg via ORAL
  Filled 2017-11-18 (×8): qty 1

## 2017-11-18 MED ORDER — SODIUM CHLORIDE 0.9 % IV SOLN
40.0000 mg | Freq: Two times a day (BID) | INTRAVENOUS | Status: DC
  Administered 2017-11-18: 40 mg via INTRAVENOUS
  Filled 2017-11-18: qty 4

## 2017-11-18 NOTE — Progress Notes (Signed)
Pt placed on cpap with a pressure of 10 and 3L bled in. Pt tolerating well. Cont to monitor.

## 2017-11-18 NOTE — Progress Notes (Signed)
Pharmacist Heart Failure Core Measure Documentation  Assessment: Richard Rosales has an EF documented as 40-45% on 10/27/17 by ECHO.  Rationale: Heart failure patients with left ventricular systolic dysfunction (LVSD) and an EF < 40% should be prescribed an angiotensin converting enzyme inhibitor (ACEI) or angiotensin receptor blocker (ARB) at discharge unless a contraindication is documented in the medical record.  This patient is not currently on an ACEI or ARB for HF.  This note is being placed in the record in order to provide documentation that a contraindication to the use of these agents is present for this encounter.  ACE Inhibitor or Angiotensin Receptor Blocker is contraindicated (specify all that apply)  [x]   ACEI allergy (cough) AND ARB allergy []   Angioedema []   Moderate or severe aortic stenosis []   Hyperkalemia [x]   Hypotension -Improving []   Renal artery stenosis []   Worsening renal function, preexisting renal disease or dysfunction    Brain Hilts 11/18/2017 10:14 AM

## 2017-11-18 NOTE — Progress Notes (Signed)
Notified MD Shahmehdi regarding patient's large to moderate GI bleed this afternoon, order given to obtain hemoglobin and hematocrit, orders entered Neta Mends RN 2:51 PM

## 2017-11-18 NOTE — Progress Notes (Signed)
Pt came to MR for cspine and tspine.  Pt was given meds before exam.  Pt was very uncomfortable laying on his back and after the sagittal sequences of the cervical only, pt refused further imaging.  Images taken will be turned into the radiologist and pt was sent back to the floor.

## 2017-11-18 NOTE — Consult Note (Addendum)
[facility] Consult: 3:39 PM 11/18/2017  LOS: 5 days    Referring Provider: Dr Roger Shelter  Primary Care Physician:  Unk Pinto, MD Primary Gastroenterologist:  Dr. Deatra Ina     Reason for Consultation:  Painless hematochezia.     HPI: Richard Rosales is a 84 y.o. male.  Hx CHF systolic.  Atrial fibrillation not on anticoagulation.  COPD with chronic hypoxia.  Hypothyroidism.  Peripheral neuropathy.  Spinal stenosis with chronic lower extremity weakness.  Diverticular bleeds in remote past, once in 1999 and 3 times in 2012.  A CT chest of 10/2013 shows cirrhosis of the liver.  In reviewing Dr. Kelby Fam office note, this had never come to his attention and has never been an active issue. Colonoscopy 01/2000.  Removed tubular adenoma. Colonoscopy 01/2002.  For IDA.  10 mm colon polyp, diverticulosis.  Unable to locate associated pathology report EGD 01/2002.  For IDA. Clean based gastric ulcer, gastritis.  No active bleeding and no stigmata of recent bleeding.  Unable to locate associated pathology report Colonoscopy 02/2005 for FOBT +.   Diverticulosis, internal hemorrhoids, colon polyps.  Polyp pathology showed polypoid colorectal mucosa without adenomatous changes or malignancy. Colonoscopy 02/2009.  To evaluate hematochezia.  Moderate diverticulosis from the a sending to sigmoid colon.  Three polyps removed measuring 4 - 54mm from the descending, sigmoid and rectum.  Pathology consistent with both hyperplastic and adenomatous polyps, no HGD.    Patient admitted recently and discharged 11/12/2017 for UTI, hypokalemia and lethargy.  He was discharged to SNF with [facility].  Within 24 hours he became acutely lightheaded with sitting up or standing and had a syncopal spell during physical therapy.  Hypovolemia related  hypotension versus autonomic dysfunction from peripheral neuropathy was suspected.  He was readmitted on 11/13/2017. MRI of the head showed moderately advanced for age atrophy and white matter disease, cervical stenosis.  Yesterday he had blood noted with his bowel movement.  This afternoon, after returning from brain MRI, he had a moderately large, mostly bloody bowel movement.  RN confirms that this was bright red blood with clots and not melena.  This was red blood.  Patient is not having any associated nausea or abdominal pain.  Generally he alternates between constipation and diarrhea and uses stool softeners and MiraLAX as needed.  His wife is not sure if he is recently been constipated or having diarrhea since he has been either in the hospital or at [facility] for the last several weeks.  She does say that he has not had any hematochezia for several years. Hgb has gone from 13.5 gradually down to 10.4 yesterday, 9.8 at 1500 today.  MCV normal.  Platelets, coags normal.  Normal.  Chronic meds include aspirin 81 mg every M/W/F and Protonix 40 mg daily.  For the last 5 days he has been getting subcu Lovenox.  This is now discontinued but was last given at 9 AM today. Hospitalist ordered a nuclear medicine bleeding scan but this has yet to be performed.     Past Medical History:  Diagnosis Date  .  Acute bronchitis   . Adenomatous colon polyp   . Allergic rhinitis   . Anemia    iron deficient  . Atrial fibrillation ([place])   . Atrial flutter ([place])   . Chronic gastritis   . COPD (chronic obstructive pulmonary disease) ([place])   . Diastolic dysfunction   . Diverticulosis of colon    recurrent GI bleeding  . DJD (degenerative joint disease)   . GERD (gastroesophageal reflux disease)   . GI bleed   . HTN (hypertension)   . Hyperplastic colon polyp 2003  . Hypothyroidism   . Moderate aortic insufficiency   . OSA (obstructive sleep apnea)    compliants w/ CPAP  . Positive PPD    remote  .  Shortness of breath   . Venous insufficiency     Past Surgical History:  Procedure Laterality Date  . CARDIAC CATHETERIZATION  7.27.05   revealed a preserved EF with no CAD  . COLONOSCOPY    . HERNIA REPAIR Bilateral   . JOINT REPLACEMENT  2001   lt total knee-rt one done 00  . NASAL SINUS SURGERY    . POLYPECTOMY     adenomatous polyps 2010  . SHOULDER ARTHROSCOPY    . TOE SURGERY Right   . TONSILLECTOMY    . TOTAL KNEE ARTHROPLASTY     bilateral    Prior to Admission medications   Medication Sig Start Date End Date Taking? Authorizing Provider  albuterol (PROVENTIL HFA;VENTOLIN HFA) 108 (90 Base) MCG/ACT inhaler Inhale 2 puffs into the lungs every 6 (six) hours as needed for wheezing or shortness of breath. 08/21/15  Yes Collene Gobble, MD  albuterol (PROVENTIL) (2.5 MG/3ML) 0.083% nebulizer solution Take 3 mLs (2.5 mg total) by nebulization every 4 (four) hours as needed for wheezing or shortness of breath. 10/31/17  Yes Hosie Poisson, MD  aspirin EC 81 MG tablet Take 81 mg by mouth every Monday, Wednesday, and Friday. 11/16/17  Yes [provider]  azelastine (ASTELIN) 0.1 % nasal spray USE 1 SPRAY IN EACH NOSTRIL TWICE A DAY 07/29/17  Yes Parrett, Tammy S, NP  b complex vitamins capsule Take 1 capsule by mouth daily.   Yes [provider]  calcium carbonate (TUMS - DOSED IN MG ELEMENTAL CALCIUM) 500 MG chewable tablet Chew 2 tablets by mouth 3 (three) times daily as needed for indigestion.   Yes [provider]  carvedilol (COREG) 6.25 MG tablet Take 1 tablet (6.25 mg total) by mouth 2 (two) times daily with a meal. 10/31/17  Yes Hosie Poisson, MD  cefdinir ([name]) 300 MG capsule Take 1 capsule (300 mg total) by mouth every 12 (twelve) hours. Patient taking differently: Take 300 mg by mouth See admin instructions. Take 300 mg by mouth two times a day for 3 days 11/12/17  Yes Donne Hazel, MD  Cholecalciferol (VITAMIN D) 2000 units tablet Take 4,000  Units by mouth daily.   Yes [provider]  docusate sodium (COLACE) 100 MG capsule Take 100 mg by mouth 2 (two) times daily.     Yes [provider]  furosemide (LASIX) 40 MG tablet Take 40 mg by mouth 2 (two) times daily.   Yes [provider]  GLUCOSAMINE-CHONDROITIN PO Take 1 capsule by mouth daily.   Yes [provider]  HYDROcodone-acetaminophen (NORCO/VICODIN) 5-325 MG tablet Take 1-2 tablets by mouth every 4 (four) hours as needed for moderate pain or severe pain.    Yes [provider]  levothyroxine (SYNTHROID,  LEVOTHROID) 100 MCG tablet TAKE 1 TABLET DAILY BEFORE BREAKFAST 06/17/17  Yes Liane Comber, NP  Multiple Vitamin (MULTIVITAMIN WITH MINERALS) TABS tablet Take 1 tablet by mouth daily.   Yes [provider]  Omega-3 Fatty Acids (FISH OIL) 1000 MG CAPS Take 1,000 mg by mouth 2 (two) times daily.   Yes [provider]  omeprazole (PRILOSEC) 40 MG capsule TAKE 1 CAPSULE TWICE A DAY Patient taking differently: Take 40 mg by mouth two times a day 06/17/17  Yes Liane Comber, NP  OXYGEN Inhale 2 L into the lungs continuous.   Yes [provider]  potassium chloride (K-DUR) 10 MEQ tablet Take 1 tablet (10 mEq total) by mouth daily. 04/09/17  Yes Parrett, Fonnie Mu, NP  SPIRIVA HANDIHALER 18 MCG inhalation capsule INHALE THE CONTENTS OF 1 CAPSULE DAILY 08/18/17  Yes Collene Gobble, MD  SYMBICORT 160-4.5 MCG/ACT inhaler USE 2 INHALATIONS TWICE A DAY 10/26/17  Yes Byrum, Rose Fillers, MD  UNABLE TO FIND CPAP: At bedtime   Yes [provider]  ammonium lactate (LAC-HYDRIN) 12 % lotion Apply topically every morning. Patient not taking: Reported on 11/13/2017 10/31/17   Hosie Poisson, MD  bisacodyl (DULCOLAX) 10 MG suppository Place 10 mg rectally once as needed (if no BM in 8 hours after MOM administered).    [provider]  Magnesium Hydroxide (MILK OF MAGNESIA PO) Take 30 mLs by mouth once as needed (if no  BM in 3 days).    [provider]  Sodium Phosphates (FLEET ENEMA RE) Place 1 each rectally once as needed (if no BM within 2 hours after dulcolax suppository).    [provider]    Scheduled Meds: . aspirin EC  81 mg Oral Q M,W,F  . azelastine  1 spray Each Nare BID  . B-complex with vitamin C  1 tablet Oral Daily  . cholecalciferol  4,000 Units Oral Daily  . docusate sodium  100 mg Oral BID  . [START ON 11/19/2017] furosemide  60 mg Oral QODAY  . levothyroxine  100 mcg Oral QAC breakfast  . metoprolol tartrate  12.5 mg Oral BID  . mometasone-formoterol  2 puff Inhalation BID  . multivitamin with minerals  1 tablet Oral Daily  . neomycin-bacitracin-polymyxin   Topical Daily  . potassium chloride  10 mEq Oral Daily  . sodium chloride flush  3 mL Intravenous Q12H  . tiotropium  1 capsule Inhalation Daily   Infusions: . famotidine (PEPCID) IV     PRN Meds: acetaminophen **OR** acetaminophen, albuterol, bisacodyl, calcium carbonate, diazepam, oxyCODONE, senna-docusate, sodium phosphate   Allergies as of 11/13/2017 - Review Complete 11/13/2017  Allergen Reaction Noted  . Other Other (See Comments) 11/13/2017  . Ace inhibitors Cough 06/12/2013  . Beta adrenergic blockers Other (See Comments) 06/12/2013  . Decadron [dexamethasone] Other (See Comments) 11/14/2014  . Fluticasone-salmeterol Other (See Comments)   . Hytrin [terazosin] Other (See Comments) 06/12/2013  . Prednisone    . Vioxx [rofecoxib] Other (See Comments) 06/12/2013    Family History  Problem Relation Age of Onset  . Hypertension Mother   . Diabetes Father   . Heart failure Father   . Arrhythmia Sister 15  . Lung cancer Brother        chemical    Social History   Socioeconomic History  . Marital status: Married    Spouse name: Not on file  . Number of children: 4  . Years of education: Not on file  . Highest  education level: Not on file  Occupational History  . Occupation: part-time  as a Theme park manager at [employer]  . Financial resource strain: Not on file  . Food insecurity:    Worry: Not on file    Inability: Not on file  . Transportation needs:    Medical: Not on file    Non-medical: Not on file  Tobacco Use  . Smoking status: Former Smoker    Packs/day: 1.00    Years: 20.00    Pack years: 20.00    Types: Cigarettes    Last attempt to quit: 08/01/1978    Years since quitting: 39.3  . Smokeless tobacco: Never Used  Substance and Sexual Activity  . Alcohol use: Yes    Alcohol/week: 0.0 oz    Comment: small glass of wine daily  . Drug use: No  . Sexual activity: Not on file  Lifestyle  . Physical activity:    Days per week: Not on file    Minutes per session: Not on file  . Stress: Not on file  Relationships  . Social connections:    Talks on phone: Not on file    Gets together: Not on file    Attends religious service: Not on file    Active member of club or organization: Not on file    Attends meetings of clubs or organizations: Not on file    Relationship status: Not on file  . Intimate partner violence:    Fear of current or ex partner: Not on file    Emotionally abused: Not on file    Physically abused: Not on file    Forced sexual activity: Not on file  Other Topics Concern  . 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    REVIEW OF SYSTEMS: Constitutional: Weakness. ENT:  No nose bleeds Pulm: Overall his breathing has improved in the last several weeks with diuresis. CV:  No palpitations, no pain.  A few weeks ago patient had fairly massive lower extremity edema.  He has persistent edema but wife says that it has improved overall. GU:  No hematuria, no frequency GI: Generally has a good appetite but in recent weeks this has diminished.  No dysphagia. Heme: Other than the lower GI  bleeding, patient has not had any excessive bleeding or bruising. Transfusions: Reviewed epic and there is no records of previous blood transfusions. Neuro: Wife confirms that the patient has recently been more somnolent and confused. Derm:  No itching, no rash or sores.  Endocrine:  No sweats or chills.  No polyuria or dysuria Immunization: Reviewed. Travel:  None beyond local counties in last few months.    PHYSICAL EXAM: Vital signs in last 24 hours: Vitals:   11/18/17 0840 11/18/17 1308  BP:  118/68  Pulse:  (!) 110  Resp:  20  Temp:  98.9 F (37.2 C)  SpO2: 98% 97%   Wt Readings from Last 3 Encounters:  11/18/17 234 lb 5.6 oz (106.3 kg)  11/12/17 234 lb 9.1 oz (106.4 kg)  10/31/17 227 lb 1.2 oz (103 kg)    General: Obese and chronically ill.  Somnolent. Head: No signs of head trauma.  Symmetric facies.  No facial edema. Eyes: No scleral icterus.  No conjunctival pallor. Ears: Seems to have some hearing loss, had to speak loud to him to  get him to wake up but this may be more a mental status problem than a hearing problem Nose: No discharge or congestion. Mouth: No blood in the mouth. Neck: No JVD, no masses, no thyromegaly. Lungs: No labored breathing or cough.  Lungs clear bilaterally though overall reduced breath sounds. Heart: RRR.  No MRG.  S1, S2 present Abdomen: Obese.  Soft.  No bruising.  No HSM, masses, hernias, bruits..   Rectal: Did not perform.  RN reports medium to bright red blood with clots earlier this afternoon.  This also confirmed by the patient's wife. Musc/Skeltl: No joint redness or swelling. Extremities: Bilateral lower extremity edema from the mid calf down associated with venous stasis changes and venous stasis dermatitis.  No open sores. Neurologic: Patient is slow to respond and has difficulty answering accurately.  When asked where he was he answered "I do not know I am confused".   Skin: Venous stasis changes in the lower legs. Tattoos:  None. Nodes: No cervical adenopathy. Psych: Calm, pleasant.  Intake/Output from previous day: 05/28 0701 - 05/29 0700 In: 240 [P.O.:240] Out: 250 [Urine:250] Intake/Output this shift: Total I/O In: 120 [P.O.:120] Out: 250 [Urine:250]  LAB RESULTS: Recent Labs    11/16/17 0340 11/17/17 0609 11/18/17 0752 11/18/17 1457  WBC 7.3 5.6 8.8  --   HGB 12.5* 13.0 10.4* 9.8*  HCT 38.7* 39.9 31.8* 29.6*  PLT 157 192 196  --    BMET Lab Results  Component Value Date   NA 133 (L) 11/18/2017   NA 135 11/17/2017   NA 136 11/16/2017   K 4.5 11/18/2017   K 4.4 11/17/2017   K 4.5 11/16/2017   CL 95 (L) 11/18/2017   CL 95 (L) 11/17/2017   CL 94 (L) 11/16/2017   CO2 28 11/18/2017   CO2 33 (H) 11/17/2017   CO2 35 (H) 11/16/2017   GLUCOSE 89 11/18/2017   GLUCOSE 92 11/17/2017   GLUCOSE 96 11/16/2017   BUN 13 11/18/2017   BUN 8 11/17/2017   BUN 8 11/16/2017   CREATININE 0.77 11/18/2017   CREATININE 0.73 11/17/2017   CREATININE 0.75 11/16/2017   CALCIUM 8.2 (L) 11/18/2017   CALCIUM 8.7 (L) 11/17/2017   CALCIUM 8.6 (L) 11/16/2017   LFT Recent Labs    11/16/17 0340  PROT 5.3*  ALBUMIN 2.3*  AST 28  ALT 23  ALKPHOS 93  BILITOT 0.7   PT/INR Lab Results  Component Value Date   INR 1.11 10/03/2010   INR 1.20 07/25/2009   INR 1.1 02/19/2009   Hepatitis Panel No results for input(s): HEPBSAG, HCVAB, HEPAIGM, HEPBIGM in the last 72 hours. C-Diff No components found for: CDIFF Lipase  No results found for: LIPASE  Drugs of Abuse  No results found for: LABOPIA, COCAINSCRNUR, LABBENZ, AMPHETMU, THCU, LABBARB   RADIOLOGY STUDIES: Mr Cervical Spine Wo Contrast  Result Date: 11/18/2017 CLINICAL DATA:  Cervical spinal stenosis. EXAM: MRI CERVICAL SPINE WITHOUT CONTRAST TECHNIQUE: Multiplanar, multisequence MR imaging of the cervical spine was performed. No intravenous contrast was administered. COMPARISON:  Radiography 09/18/2016.  Previous MRI 2005. FINDINGS: Patient  would not tolerate axial imaging. Only sagittal sequences were obtained. The patient would not tolerate thoracic spine imaging as was ordered. Alignment: 6 mm anterolisthesis C3-4. Vertebrae: Distant fusion at C4-5. Cord: Some cord deformity at the C3-4 level. AP diameter of the canal only approximately 6 mm. Posterior Fossa, vertebral arteries, paraspinal tissues: Negative Disc levels: Foramen magnum sufficiently patent. Ordinary osteoarthritis at C1-2 without stenosis.  C2-3 shows facet arthropathy on the left but no central canal stenosis. C3-4 shows central canal stenosis because of facet arthropathy and 6 mm of anterolisthesis. As noted above, the cord could be partially compressed at this level though there is no visible abnormal T2 signal. C4-5 shows distant fusion with sufficient patency of the canal and foramina. C5-6 shows spondylosis with osteophytes that narrow the ventral subarachnoid space but do not compress the cord. C6-7 shows small osteophytes but no compressive stenosis. C7-T1 is normal. Upper thoracic region is unremarkable. IMPRESSION: Spinal stenosis at C3-4. Advanced facet arthropathy with anterolisthesis of 6 mm. The cord may be compressed or partially compressed at this level. See above discussion. Electronically Signed   By: [place] Chimes [name]   On: 11/18/2017 14:00     IMPRESSION:   *      Painless hematochezia. History remote diverticular bleeding.  Current symptoms consistent with diverticular bleed.  If he has recently had issues with constipation or obstipation, he could have a stroke oral ulcer that is bleeding. Given this is hematochezia and not melena, it is less likely to be upper GI source though cannot rule this out entirely.  However his BUN is not elevated.    *    Cirrhosis of the liver seen on chest CT in 2015. No dedicated liver imaging found in epic going back more than 15 years. Last set of coags was in 2012  *    Acute blood loss anemia.  No transfusions to  date.  *    COPD, chronic hypoxia.  CHF.    PLAN:     *   Await findings of nuclear medicine bleeding scan. Agree with your decision to stop Lovenox.  I do not think he needs IV Pepcid place of oral Protonix so we will restart oral Protonix.  *    Allow full liquid diet.  *   Check CBC and PT/INR in the morning.   Azucena Freed  11/18/2017, 3:39 PM Phone [phone]  GI ATTENDING  History, laboratories, x-rays, multiple prior endoscopy reports reviewed. Patient seen and examined. Agree with comprehensive consultation note as outlined above. We are asked to see the patient for GI bleeding. Has painless hematochezia. Significant decrease in hemoglobin from baseline. BUN normal. Known to have pandiverticulosis. Has had prior diverticular bleeding. Clinical picture most consistent with acute diverticular bleeding. Hemodynamically stable. Agree with holding prophylactic blood thinner. Monitor stools and hemoglobin. Transfuse as clinically indicated. If bleeding persists and a significant manner I would recommend a tagged red blood cell scan. If positive proceed to interventional radiology for possible embolization therapy. We'll follow. Thank you.  Docia Chuck. Geri Seminole., [name] [facility] Division of Gastroenterology.

## 2017-11-18 NOTE — Progress Notes (Addendum)
PROGRESS NOTE    Richard Rosales  CWC:[id number] DOB: [birth year] DOA: 11/13/2017 PCP: Unk Pinto, MD   Subjective;  Patient was seen and examined this morning, stable no acute distress. Patient has refused MRI of his spine yesterday.  Was addressed again now he is willing to try.  As long as he gets some sedative medication such as Ativan or Valium. He states he is unable to tolerate it he is not claustrophobic, but due to extensive pain and discomfort.  Later this afternoon after completion of his MRI, return back to room per nursing staff had a large bloody bowel movement.  [name] given has been contacted and consulted.   ============================================================================  Brief Narrative:  Richard Rosales is a 84 y.o. male with medical history significant for chronic systolic CHF, atrial fibrillation not on anticoagulation, hypothyroidism, COPD with chronic hypoxic respiratory failure, and UTI and antibiotics, now presenting for evaluation of syncope.  Patient was discharged from the hospital yesterday after management of the lethargy, hypokalemia, and UTI.  His condition was in part attributed to recent diuresis and the patient was hydrated while in the hospital.  He was treated with antibiotics and discharged back to his SNF yesterday to complete a course of [place].  He reports feeling well while at rest, but becomes acutely lightheaded while sitting up or trying to stand.  He had a syncopal episode today while trying to participate in physical therapy.  He denies any chest pain or palpitations and denies shortness of breath, though he is requiring increased supplemental oxygen.  Presented with syncope 2/2 orthostatic hypotension thought 2/2 hypovolemia from diuresis vs autonomic dysfunction from peripheral neuropathy.  Has LE weakness likely related to spinal stenosis and peripheral neuropathy.  Neurology asked to evaluate on 5/28, recommending  MRI.  Hopefully after this w/u complete, maybe able to get him back to [facility] on 5/29. ----------------------------------------------------------------------------------------------------------------------------------------- Assessment & Plan:   Principal Problem:   Syncope Active Problems:   OSA and COPD overlap syndrome (HCC)   Essential hypertension   COPD (chronic obstructive pulmonary disease) (HCC)   Hypothyroidism   A-fib (HCC)   Acute on chronic systolic CHF (congestive heart failure) (HCC)   Elevated troponin   UTI (urinary tract infection)   Pressure injury of skin   Orthostatic hypotension  Acute GI bleed/? rectal bleed -Patient's hemoglobin dropped from 13-->10--> 9.8 -Patient is switched to IV Pepcid -[place] PA Judson Roch given has been contacted and consulted.  -Patient will be kept n.p.o. -Monitoring H&H closely -Concerning to transfuse with packed red blood cell if H&H drops any further and patient becomes more symptomatic.  Syncope / due to orthostatic hypotension due to hypovolemia or autonomic dysfunction related to polyneuropathy -Stable no syncopal episode overnight -Pressure stable this morning, but tachycardic 110 - Presents from SNF after a syncopal episode.  - He was just discharged the day before and has reportedly been orthostatic with reported 50 mmHg drop in SBP on standing at his SNF and he had a syncopal episode while during PT  - Patient denies chest pain, palpitations, or any other complaints  - Initially had lasix and coreg held and was hydrated -patient medication has been changed to Lasix every other day, metoprolol switched to Coreg  -- Only able to get lying and sitting orthostatics on 5/27 (not able to get standing), but these were negative - Echo done earlier this month with EF 40-45%, diffuse HK, severe LAE, mild AI, mild AS, and mild pulm HTN  -  MRI brain notable for atrophy and white matter changes moderately advanced for age -Could not  complete MRI of the spine   Generalized Weakness:  - Multifactorial.  Likely some contribution from spinal stenosis, heart failure, and peripheral neuropathy.  Not a great candidate for and surgery for spinal disease.  Had appointment with Dr. [name] as outpatient that he missed as result of his recent hospitalizations.  Discussed again with Dr. 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 [name] note from 12/04/2016.  MRI brain notable for atrophy and white matter changes moderately advance for age - nonspecific finding (See report)  - Still with R>L LE weakness today, but also noticed some involuntary jerking of his LE which I hadn't noticed before,  -Unfortunately patient is unable to complete MRI of the spine due to uncomfortable position on the table -Neurology consulted - will need outpatient follow up with neurology and neurosurgery  - continue PT/OT and treatment of other chronic medical conditions - repeat spep/immunofixation pending,    Chronic systolic CHF  - CXR with cardiomegaly and pulm edema with small bilateral effusions and L greater than R bibasilar opacities (CXR stable) - cont. With  lasix every other day -DC Coreg, continue metoprolol - Follow daily wt and I/O's - Echo done earlier this month with EF 40-45%, diffuse HK, severe LAE, mild AI, mild AS, and mild pulm HTN    Wt Readings from Last 3 Encounters:  11/18/17 106.3 kg (234 lb 5.6 oz)  11/12/17 106.4 kg (234 lb 9.1 oz)  10/31/17 103 kg (227 lb 1.2 oz)    COPD with chronic hypoxic respiratory failure  - No wheezing on admission, has increased O2-requirement but denies SOB or cough  - Continue ICS/LABA, Spiriva, and prn albuterol    - Continue supplemental O2 (generally on 2 L, currently at baseline)  Atrial fibrillation  RVR  -Still in A. fib with RVR, heart rate about 110, was started on metoprolol, and the hope of sparing blood pressure - CHADS-VASc at least 3 (age x2, CHF)  - Follows with cardiology and has declined anticoagulation   -We will add small dose of diltiazem 30 mg p.o. every 8 hours -Monitor closely   Hypothyroidism  - Thyroid studies normal earlier this month  - Continue Synthroid     UTI  - Treated with Rocephin during recent hospitalization and transitioned to [facility] on discharge  - No fever on admission  - Completed course of omnicef  Elevated troponin  - Troponin is elevated to 0.11 in ED (down from previous hospitalization) -He continues to deny any chest pain or shortness of breath. - Continue cardiac monitoring, trend troponin, hold Coreg in light of hypotension   Hypokalemia:  -Mild, resolved , continue to monitor replete accordingly  Stage 1 decub: with foam dressing on, follow.  Frequent repositioning.  LE redness: doesn't appear infected, follow.    Full thickness wound to R great toe: ensure outpatient follow up with ortho.  Seen by wound care.  LE edema: R>L is chronic, but appeared worse yesterday per daughter.  [place] (without DVT - cystic structure noted in upper calf medially on left). Restart lasix.  DVT prophylaxis: lovenox DC'd as to GI bleed, placed on  SCDs Code Status: DNR Family Communication: wife/daughter at bedside Disposition Plan: pending improvement   Consultants:   none  Procedures:  LE [place] Final Interpretation: Right: There is no evidence of deep vein thrombosis in the lower extremity. No cystic structure found in the popliteal fossa. Left: There is no evidence of deep vein thrombosis in the lower extremity. However, portions of this examination were limited- see technologist comments above. No cystic structure found in the popliteal  fossa.  Antimicrobials:  Anti-infectives (From admission, onward)   Start     Dose/Rate Route Frequency Ordered Stop   11/14/17 0030  cefdinir ([name]) capsule 300 mg    Note to Pharmacy:  Patient taking differently: Take 300 mg by mouth two times a day for 3 days     300 mg Oral Every 12 hours 11/13/17 2038 11/16/17 0959      Subjective: Feeling better.  Ready to go.   Objective: Vitals:   11/18/17 0409 11/18/17 0422 11/18/17 0840 11/18/17 1308  BP:  107/64  118/68  Pulse:  88  (!) 110  Resp:  20  20  Temp:  99.1 F (37.3 C)  98.9 F (37.2 C)  TempSrc:  Oral  Oral  SpO2:  97% 98% 97%  Weight: 106.3 kg (234 lb 5.6 oz)     Height:        Intake/Output Summary (Last 24 hours) at 11/18/2017 1516 Last data filed at 11/18/2017 1448 Gross per 24 hour  Intake 360 ml  Output 500 ml  Net -140 ml   Filed Weights   11/16/17 0445 11/17/17 0533 11/18/17 0409  Weight: 106.1 kg (233 lb 14.5 oz) 107.2 kg (236 lb 5.3 oz) 106.3 kg (234 lb 5.6 oz)    Examination:  General: Awake alert no acute distress Cardiovascular: S1-S2, irregularly irregular, tachycardia or any gallops Lungs: Shellfish but regular wheeze no crackles Abdomen: Soft, nontender nondistended positive bowel sounds  neurological: Lysed weakness is noted, alert oriented, right-sided weakness greater than left at baseline,  Sensation intact Skin: Warm to touch, bilateral lower extremity wound, negative any open wound Extremities: Unable to ambulate without assist, negative any clubbing or cyanosis, +2 pitting edema   Data Reviewed: I have personally reviewed following labs and imaging studies  CBC: Recent Labs  Lab 11/14/17 0245 11/15/17 0426 11/16/17 0340 11/17/17 0609 11/18/17 0752 11/18/17 1457  WBC 10.0 8.1 7.3 5.6 8.8  --   HGB 13.2 12.5* 12.5* 13.0 10.4* 9.8*  HCT 39.7 38.3* 38.7* 39.9 31.8* 29.6*  MCV 92.1 92.5 93.3 93.0 91.6  --   PLT 138* 144* 157 192 196  --    Basic Metabolic Panel: Recent  Labs  Lab 11/14/17 0245 11/15/17 0426 11/16/17 0340 11/17/17 0609 11/18/17 0752  NA 133* 131* 136 135 133*  K 3.4* 4.2 4.5 4.4 4.5  CL 94* 91* 94* 95* 95*  CO2 29 33* 35* 33* 28  GLUCOSE 137* 89 96 92 89  BUN 13 8 8 8 13   CREATININE 0.75 0.54* 0.75 0.73 0.77  CALCIUM 8.3* 8.2* 8.6* 8.7* 8.2*  MG 2.0 2.1 2.3 2.1 1.9   GFR: Estimated Creatinine Clearance: 81.1 mL/min (by C-G formula based on SCr of 0.77 mg/dL). Liver Function Tests: Recent Labs  Lab 11/15/17 0426 11/16/17 0340  AST 26 28  ALT 24 23  ALKPHOS 88 93  BILITOT 0.8 0.7  PROT 5.5* 5.3*  ALBUMIN 2.3* 2.3*  Cardiac Enzymes: Recent Labs  Lab 11/13/17 2059 11/14/17 0245 11/14/17 0703  TROPONINI  0.10* 0.09* 0.10*   BNP (last 3 results) Recent Labs    04/07/17 1632  PROBNP 230.0*   HbA1C: No results for input(s): HGBA1C in the last 72 hours. CBG: Recent Labs  Lab 11/16/17 0531 11/16/17 0731 11/16/17 1708 11/17/17 0551 11/18/17 0651  GLUCAP 86 77 118* 83 86    Recent Labs    11/17/17 1519  VITAMINB12 835   Sepsis Labs: No results for input(s): PROCALCITON, LATICACIDVEN in the last 168 hours.  Recent Results (from the past 240 hour(s))  Urine culture     Status: Abnormal   Collection Time: 11/08/17  6:00 PM  Result Value Ref Range Status   Specimen Description URINE, RANDOM  Final   Special Requests   Final    NONE Performed at [facility], [address], [place], [place] [address]    Culture >=100,000 COLONIES/mL ENTEROBACTER CLOACAE (A)  Final   Report Status 11/10/2017 FINAL  Final   Organism ID, Bacteria ENTEROBACTER CLOACAE (A)  Final      Susceptibility   Enterobacter cloacae - MIC*    CEFAZOLIN >=64 RESISTANT Resistant     CEFTRIAXONE <=1 SENSITIVE Sensitive     CIPROFLOXACIN <=0.25 SENSITIVE Sensitive     GENTAMICIN <=1 SENSITIVE Sensitive     IMIPENEM <=0.25 SENSITIVE Sensitive     NITROFURANTOIN 64 INTERMEDIATE Intermediate     TRIMETH/SULFA <=20 SENSITIVE  Sensitive     PIP/TAZO 16 SENSITIVE Sensitive     * >=100,000 COLONIES/mL ENTEROBACTER CLOACAE  MRSA PCR Screening     Status: None   Collection Time: 11/08/17 10:12 PM  Result Value Ref Range Status   MRSA by PCR NEGATIVE NEGATIVE Final    Comment:        The GeneXpert MRSA Assay (FDA approved for NASAL specimens only), is one component of a comprehensive MRSA colonization surveillance program. It is not intended to diagnose MRSA infection nor to guide or monitor treatment for MRSA infections. Performed at [facility], [place] [address], [place], [place] [address]   Urine culture     Status: Abnormal   Collection Time: 11/13/17  3:30 PM  Result Value Ref Range Status   Specimen Description URINE, RANDOM  Final   Special Requests   Final    NONE Performed at [facility], [place] [address], [place], [place] [address]    Culture MULTIPLE SPECIES PRESENT, SUGGEST RECOLLECTION (A)  Final   Report Status 11/14/2017 FINAL  Final         Radiology Studies: Mr Cervical Spine Wo Contrast  Result Date: 11/18/2017 CLINICAL DATA:  Cervical spinal stenosis. EXAM: MRI CERVICAL SPINE WITHOUT CONTRAST TECHNIQUE: Multiplanar, multisequence MR imaging of the cervical spine was performed. No intravenous contrast was administered. COMPARISON:  Radiography 09/18/2016.  Previous MRI 2005. FINDINGS: Patient would not tolerate axial imaging. Only sagittal sequences were obtained. The patient would not tolerate thoracic spine imaging as was ordered. Alignment: 6 mm anterolisthesis C3-4. Vertebrae: Distant fusion at C4-5. Cord: Some cord deformity at the C3-4 level. AP diameter of the canal only approximately 6 mm. Posterior Fossa, vertebral arteries, paraspinal tissues: Negative Disc levels: Foramen magnum sufficiently patent. Ordinary osteoarthritis at C1-2 without stenosis. C2-3 shows facet arthropathy on the left but no central canal stenosis. C3-4 shows central canal stenosis because of  facet arthropathy and 6 mm of anterolisthesis. As noted above, the cord could be partially compressed at this level though there is no visible abnormal T2 signal. C4-5 shows  distant fusion with sufficient patency of the canal and foramina. C5-6 shows spondylosis with osteophytes that narrow the ventral subarachnoid space but do not compress the cord. C6-7 shows small osteophytes but no compressive stenosis. C7-T1 is normal. Upper thoracic region is unremarkable. IMPRESSION: Spinal stenosis at C3-4. Advanced facet arthropathy with anterolisthesis of 6 mm. The cord may be compressed or partially compressed at this level. See above discussion. Electronically Signed   By: Nelson Chimes [name]   On: 11/18/2017 14:00     Scheduled Meds: . aspirin EC  81 mg Oral Q M,W,F  . azelastine  1 spray Each Nare BID  . B-complex with vitamin C  1 tablet Oral Daily  . cholecalciferol  4,000 Units Oral Daily  . docusate sodium  100 mg Oral BID  . [START ON 11/19/2017] furosemide  60 mg Oral QODAY  . levothyroxine  100 mcg Oral QAC breakfast  . metoprolol tartrate  12.5 mg Oral BID  . mometasone-formoterol  2 puff Inhalation BID  . multivitamin with minerals  1 tablet Oral Daily  . neomycin-bacitracin-polymyxin   Topical Daily  . pantoprazole  40 mg Oral BID  . potassium chloride  10 mEq Oral Daily  . sodium chloride flush  3 mL Intravenous Q12H  . tiotropium  1 capsule Inhalation Daily   Continuous Infusions:    LOS: 5 days    Time spent: over [place], MD [facility] Pager [phone]   If 7PM-7AM, please contact night-coverage [email or web address] Password [facility] 11/18/2017, 3:16 PM

## 2017-11-18 NOTE — Progress Notes (Signed)
Patient had bloody stool with approximately 30cc of dark red blood and hard lumps of stool. [facility] (no response), Family practice (Not our patient), and Internal medicine (Not our patient) paged. Will inform rounding MD in AM

## 2017-11-19 ENCOUNTER — Encounter: Payer: Self-pay | Admitting: Internal Medicine

## 2017-11-19 DIAGNOSIS — D62 Acute posthemorrhagic anemia: Secondary | ICD-10-CM

## 2017-11-19 DIAGNOSIS — K625 Hemorrhage of anus and rectum: Secondary | ICD-10-CM

## 2017-11-19 LAB — PROTEIN ELECTROPHORESIS, SERUM
A/G Ratio: 1 (ref 0.7–1.7)
ALPHA-2-GLOBULIN: 0.8 g/dL (ref 0.4–1.0)
Albumin ELP: 2.5 g/dL — ABNORMAL LOW (ref 2.9–4.4)
Alpha-1-Globulin: 0.3 g/dL (ref 0.0–0.4)
BETA GLOBULIN: 0.9 g/dL (ref 0.7–1.3)
Gamma Globulin: 0.6 g/dL (ref 0.4–1.8)
Globulin, Total: 2.5 g/dL (ref 2.2–3.9)
M-Spike, %: 0.2 g/dL — ABNORMAL HIGH
Total Protein ELP: 5 g/dL — ABNORMAL LOW (ref 6.0–8.5)

## 2017-11-19 LAB — CBC
HCT: 27.9 % — ABNORMAL LOW (ref 39.0–52.0)
HEMOGLOBIN: 9.1 g/dL — AB (ref 13.0–17.0)
MCH: 30.1 pg (ref 26.0–34.0)
MCHC: 32.6 g/dL (ref 30.0–36.0)
MCV: 92.4 fL (ref 78.0–100.0)
Platelets: 188 10*3/uL (ref 150–400)
RBC: 3.02 MIL/uL — ABNORMAL LOW (ref 4.22–5.81)
RDW: 13.4 % (ref 11.5–15.5)
WBC: 6.5 10*3/uL (ref 4.0–10.5)

## 2017-11-19 LAB — PROTIME-INR
INR: 1.15
PROTHROMBIN TIME: 14.6 s (ref 11.4–15.2)

## 2017-11-19 LAB — GLUCOSE, CAPILLARY: GLUCOSE-CAPILLARY: 97 mg/dL (ref 65–99)

## 2017-11-19 NOTE — Progress Notes (Signed)
Physical Therapy Treatment Patient Details Name: Richard Rosales MRN: [id number] DOB: May 19, 1931 Today's Date: 11/19/2017    History of Present Illness 84 y.o. male admitted 11/13/17 after syncopal event at SNF. Pt was recently d/c from hospital 5/23 for management of lethargy, hypokalemia, and UTI. PMH includes: systolic CHF, a fib not on anticoagulation, COPD with chronic hypoxic respiratory failure and UTI. Patients BP was 81/47 at ED arrival. Chest Xray notable for cardiomegaly with pulmonary edema superimposed on emphysematous changes.     PT Comments    Pt pleasant and joking throughout session and appears to be cognitively intact. Session focused on bed mobility and transfers. Stedy used for transferring pt from [employer] and Consulting civil engineer. Pt performed sit>stand from elevated bed, BSC, and Stedy with various assist levels needed (see mobility section). Subjectively, pt states that sit>stand was significantly improved today.  Follow Up Recommendations  SNF;Supervision/Assistance - 24 hour     Equipment Recommendations  None recommended by PT    Recommendations for Other Services       Precautions / Restrictions Precautions Precautions: Fall Restrictions Weight Bearing Restrictions: No    Mobility  Bed Mobility Overal bed mobility: Needs Assistance Bed Mobility: Rolling;Sidelying to Sit Rolling: +2 for physical assistance;Max assist Sidelying to sit: HOB elevated;Max assist;+2 for physical assistance       General bed mobility comments: Pt required max A x 2 for bed mobility with assistance required for BLE and trunk for S/L>sit EOB. Pt required assistance to place LUE on bed rail.   Transfers Overall transfer level: Needs assistance   Transfers: Sit to/from Stand Sit to Stand: Min guard;Mod assist;Max assist;+2 physical assistance;From elevated surface         General transfer comment: Pt Performed sit<>stand from elevated bed mod A x 2 with cueing required  for forward trunk lean and hand placement. Pt performed sit<>stand from [facility] max A x 2. Pt performed sit<>stand x 2 from Stedy min guard x 2. Pt on 2L O2 throughout session. Pt able to maintain standing position for 10 seconds min guard x 2  Ambulation/Gait             General Gait Details: Not appropriate today   Stairs             Wheelchair Mobility    Modified Rankin (Stroke Patients Only)       Balance Overall balance assessment: Needs assistance Sitting-balance support: No upper extremity supported;Feet supported Sitting balance-Leahy Scale: Fair Sitting balance - Comments: Able to maintain sitting at EOB Postural control: Other (comment)(Flexes trunk forward) Standing balance support: Bilateral upper extremity supported;During functional activity Standing balance-Leahy Scale: Poor Standing balance comment: Pt able to stand min guard x 2 with Charlaine Dalton for 10 seconds                            Cognition Arousal/Alertness: Awake/alert Behavior During Therapy: WFL for tasks assessed/performed Overall Cognitive Status: Within Functional Limits for tasks assessed                                 General Comments: Pt friendly and joking throughout session; Cognition appears intact      Exercises      General Comments        Pertinent Vitals/Pain Pain Assessment: 0-10 Pain Score: 7  Pain Location: Generalized Pain Descriptors / Indicators: Aching;Sore;Discomfort;Constant Pain Intervention(s): Limited activity  within patient's tolerance;Monitored during session    Home Living                      Prior Function            PT Goals (current goals can now be found in the care plan section) Acute Rehab PT Goals Patient Stated Goal: No goal stated today Progress towards PT goals: Progressing toward goals    Frequency    Min 2X/week      PT Plan Current plan remains appropriate    Co-evaluation               AM-PAC PT "6 Clicks" Daily Activity  Outcome Measure  Difficulty turning over in bed (including adjusting bedclothes, sheets and blankets)?: Unable Difficulty moving from lying on back to sitting on the side of the bed? : Unable Difficulty sitting down on and standing up from a chair with arms (e.g., wheelchair, bedside commode, etc,.)?: Unable Help needed moving to and from a bed to chair (including a wheelchair)?: Total Help needed walking in hospital room?: Total Help needed climbing 3-5 steps with a railing? : Total 6 Click Score: 6    End of Session Equipment Utilized During Treatment: Gait belt;Oxygen Activity Tolerance: Patient tolerated treatment well Patient left: with call bell/phone within reach;in chair Nurse Communication: Other (comment);Mobility status;Need for lift equipment(Need for Condom Catheter to be placed) PT Visit Diagnosis: Muscle weakness (generalized) (M62.81);Difficulty in walking, not elsewhere classified (R26.2);Unsteadiness on feet (R26.81);Other abnormalities of gait and mobility (R26.89)     Time: 5498-2641 PT Time Calculation (min) (ACUTE ONLY): 39 min  Charges:  $Therapeutic Activity: 23-37 mins                    G Codes:       Gabe Harlowe Dowler, SPT   [employer] 11/19/2017, 12:38 PM

## 2017-11-19 NOTE — Progress Notes (Addendum)
Daily Rounding Note  11/19/2017, 8:46 AM  LOS: 6 days   SUBJECTIVE:   Some bloody stools overnight, smear of bloody stool this AM.    Pt without complaint.   No abd pain, no n/v, no SOB.  All he wants is to be able to stand up for prolonged length of time without assistance, so that he feels safe returning home.    OBJECTIVE:         Vital signs in last 24 hours:    Temp:  [97.9 F (36.6 C)-98.9 F (37.2 C)] 98.2 F (36.8 C) (05/30 0300) Pulse Rate:  [78-110] 78 (05/30 0300) Resp:  [14-20] 18 (05/30 0300) BP: (118-126)/(68-86) 119/82 (05/30 0300) SpO2:  [93 %-98 %] 98 % (05/30 0801) FiO2 (%):  [2 %] 2 % (05/29 1308) Weight:  [227 lb 1 oz (103 kg)] 227 lb 1 oz (103 kg) (05/30 0529) Last BM Date: 11/19/17 Filed Weights   11/17/17 0533 11/18/17 0409 11/19/17 0529  Weight: 236 lb 5.3 oz (107.2 kg) 234 lb 5.6 oz (106.3 kg) 227 lb 1 oz (103 kg)   General: chronically ill looking.  Comfortable and alert   Heart: RRR Chest: clear bil in front.  No cough or labored breathing Abdomen: soft, obese, NT, ND  Extremities: + LE edema Neuro/Psych:  Appropriate.   Much more alert and "with it" than yest afternoon.  Moves all 4 limbs.    Intake/Output from previous day: 05/29 0701 - 05/30 0700 In: 464 [P.O.:464] Out: 950 [Urine:950]  Intake/Output this shift: No intake/output data recorded.  Lab Results: Recent Labs    11/17/17 0609 11/18/17 0752 11/18/17 1457 11/19/17 0500  WBC 5.6 8.8  --  6.5  HGB 13.0 10.4* 9.8* 9.1*  HCT 39.9 31.8* 29.6* 27.9*  PLT 192 196  --  188   BMET Recent Labs    11/17/17 0609 11/18/17 0752  NA 135 133*  K 4.4 4.5  CL 95* 95*  CO2 33* 28  GLUCOSE 92 89  BUN 8 13  CREATININE 0.73 0.77  CALCIUM 8.7* 8.2*   LFT No results for input(s): PROT, ALBUMIN, AST, ALT, ALKPHOS, BILITOT, BILIDIR, IBILI in the last 72 hours. PT/INR Recent Labs    11/19/17 0500  LABPROT 14.6  INR 1.15     Hepatitis Panel No results for input(s): HEPBSAG, HCVAB, HEPAIGM, HEPBIGM in the last 72 hours.  Studies/Results: Mr Cervical Spine Wo Contrast  Result Date: 11/18/2017 CLINICAL DATA:  Cervical spinal stenosis. EXAM: MRI CERVICAL SPINE WITHOUT CONTRAST TECHNIQUE: Multiplanar, multisequence MR imaging of the cervical spine was performed. No intravenous contrast was administered. COMPARISON:  Radiography 09/18/2016.  Previous MRI 2005. FINDINGS: Patient would not tolerate axial imaging. Only sagittal sequences were obtained. The patient would not tolerate thoracic spine imaging as was ordered. Alignment: 6 mm anterolisthesis C3-4. Vertebrae: Distant fusion at C4-5. Cord: Some cord deformity at the C3-4 level. AP diameter of the canal only approximately 6 mm. Posterior Fossa, vertebral arteries, paraspinal tissues: Negative Disc levels: Foramen magnum sufficiently patent. Ordinary osteoarthritis at C1-2 without stenosis. C2-3 shows facet arthropathy on the left but no central canal stenosis. C3-4 shows central canal stenosis because of facet arthropathy and 6 mm of anterolisthesis. As noted above, the cord could be partially compressed at this level though there is no visible abnormal T2 signal. C4-5 shows distant fusion with sufficient patency of the canal and foramina. C5-6 shows spondylosis with osteophytes that narrow the ventral  subarachnoid space but do not compress the cord. C6-7 shows small osteophytes but no compressive stenosis. C7-T1 is normal. Upper thoracic region is unremarkable. IMPRESSION: Spinal stenosis at C3-4. Advanced facet arthropathy with anterolisthesis of 6 mm. The cord may be compressed or partially compressed at this level. See above discussion. Electronically Signed   By: Nelson Chimes [name]   On: 11/18/2017 14:00   Nm Gi Blood Loss  Result Date: 11/18/2017 CLINICAL DATA:  GI bleed EXAM: NUCLEAR MEDICINE GASTROINTESTINAL BLEEDING SCAN TECHNIQUE: Sequential abdominal images were  obtained following intravenous administration of Tc-14m labeled red blood cells. RADIOPHARMACEUTICALS:  25 mCi Tc-80m pertechnetate in-vitro labeled red cells. COMPARISON:  None. FINDINGS: Penile uptake. Excretory uptake in the bladder. No findings suspicious for active GI bleed. IMPRESSION: No findings suspicious for active GI bleed. Electronically Signed   By: Julian Hy [name]   On: 11/18/2017 21:53    ASSESMENT:   *      Painless hematochezia. Hx remote diverticular bleeding.  Current symptoms consistent with diverticular bleed.  If recent constipation or obstipation, he could have a bleeding stercoral ulcer.  Given hematochezia and not melena, BUN normal so UGIB unlikely.    NM RBC bleeding scan unrevealiing.    *    Acute blood loss anemia.  No transfusions to date. Hgb down a bit but no need of transfusion.    *    Cirrhosis of the liver on chest CT in 2015. No dedicated liver imaging found in epic going back more than 15 years. PT/INR normal.    *    COPD, chronic hypoxia.  CHF.   PLAN   *  Follow daily CBC.  Hope to avoid invasive endoscopic procedures.    *   Leave on [place] diet for now (he actually likes it!), though may be able to advance to [place] soon.    Azucena Freed  11/19/2017, 8:46 AM Phone [phone]  GI ATTENDING  Interval history data reviewed. Patient personally seen and examined. Agree with interval progress note. Patient is sitting up in bed comfortably working crossword's. No complaints. No significant bowel movement today. Hemoglobin stable. The clinical impression remains clinically significant acute diverticular bleed. Appears to have stopped. Continue to monitor blood counts and stools. Transfuse as needed. Advance diet while monitoring. Will follow.  Docia Chuck. Geri Seminole., [name] [facility] Division of Gastroenterology

## 2017-11-19 NOTE — Progress Notes (Signed)
Clinical Social Worker received phone call from patients son Harrell Gave wanting some update on patients. CSW relayed messaged to patients MD. Md stated he will contact son later on tonight or early in the morning for patient update.  [name], MSW,  [place]

## 2017-11-19 NOTE — Progress Notes (Addendum)
Patient had another episode of bloody stool tonight.  Stool was 90% red blood with blood clots. Patient reports no pain. Paged NP on call K. Schorr and informed her.  Awaiting on possible orders.   Richard Swanton, RN

## 2017-11-19 NOTE — Progress Notes (Signed)
Patient slept during the night without complains or concerns.  While bathed this morning noticed that patient had small (smear) bowel movement with dark blood in it and blood clots.  Will pass it on to the day shift.  Judie Hollick, RN

## 2017-11-19 NOTE — Progress Notes (Signed)
PROGRESS NOTE    Richard Rosales  OJJ:[id number] DOB: 22-Jul-1930 DOA: 11/13/2017 PCP: Unk Pinto, MD   Subjective;  Patient was seen and examined this morning, awake alert oriented no acute distress. Reported that he has had one bloody bowel movement yesterday evening, and this morning nurses report a tinge of blood rectally. H&H mildly dropped.  But he denies of any shortness of breath chest pain or dizziness.  He admitted that he could not tolerate the MRI of his spine, he did complete the cervical spine part.  Which reported some stenosis  Findings were discussed with him in detail He also states that he has a history of diverticulosis, history of GI bleed which she has been refusing blood thinners for it.    ============================================================================  Brief Narrative:  Richard Rosales is a 84 y.o. male with medical history significant for chronic systolic CHF, atrial fibrillation not on anticoagulation, hypothyroidism, COPD with chronic hypoxic respiratory failure, and UTI and antibiotics, now presenting for evaluation of syncope.  Patient was discharged from the hospital yesterday after management of the lethargy, hypokalemia, and UTI.  His condition was in part attributed to recent diuresis and the patient was hydrated while in the hospital.  He was treated with antibiotics and discharged back to his SNF yesterday to complete a course of [place].  He reports feeling well while at rest, but becomes acutely lightheaded while sitting up or trying to stand.  He had a syncopal episode today while trying to participate in physical therapy.  He denies any chest pain or palpitations and denies shortness of breath, though he is requiring increased supplemental oxygen.  Presented with syncope 2/2 orthostatic hypotension thought 2/2 hypovolemia from diuresis vs autonomic dysfunction from peripheral neuropathy.  Has LE weakness likely related to spinal  stenosis and peripheral neuropathy.  Neurology asked to evaluate on 5/28, recommending MRI.  Hopefully after this w/u complete, maybe able to get him back to [facility] on 5/29. ----------------------------------------------------------------------------------------------------------------------------------------- Assessment & Plan:   Principal Problem:   Syncope Active Problems:   OSA and COPD overlap syndrome (HCC)   Essential hypertension   COPD (chronic obstructive pulmonary disease) (HCC)   Hypothyroidism   A-fib (HCC)   Acute on chronic systolic CHF (congestive heart failure) (HCC)   Elevated troponin   UTI (urinary tract infection)   Pressure injury of skin   Orthostatic hypotension   GI bleed  Acute GI bleed/? rectal bleed -Possibly diverticular bleed, status post one large bowel movement overnight which was bloody, attention blood this morning. - Patient's hemoglobin dropped from 13-->10--> 9.8-->9.1 today  -Switched patient back to p.o. Protonix -GI following closely, hoping to avoid any intervention including colonoscopy, -Monitoring H&H closely - Syncope / due to orthostatic hypotension due to hypovolemia or autonomic dysfunction related to polyneuropathy -Stable no further episodes  - Presents from SNF after a syncopal episode.  - He was just discharged the day before and has reportedly been orthostatic with reported 50 mmHg drop in SBP on standing at his SNF and he had a syncopal episode while during PT  - Patient denies chest pain, palpitations, or any other complaints  - Initially had lasix and coreg held and was hydrated -patient medication has been changed to Lasix every other day, metoprolol switched to Coreg  -- Only able to get lying and sitting orthostatics on 5/27 (not able to get standing), but these were negative - Echo done earlier this month with EF 40-45%, diffuse HK, severe LAE, mild AI, mild  AS, and mild pulm HTN  - MRI brain notable for atrophy and  white matter changes moderately advanced for age -Could not complete MRI of the spine   Generalized Weakness:  - Multifactorial.  Likely some contribution from spinal stenosis, heart failure, and peripheral neuropathy.  Not a great candidate for and surgery for spinal disease.  Had appointment with Dr. [name] as outpatient that he missed as result of his recent hospitalizations.  Discussed again with Dr. 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 [name] note from 12/04/2016.  MRI brain notable for atrophy and white matter changes moderately advance for age - nonspecific finding (See report)  - Still with R>L LE weakness today, but also noticed some involuntary jerking of his LE which I hadn't noticed before,  -Unfortunately patient is unable to complete MRI of the spine due to uncomfortable position on the table  -He did manage to get a cervical spine MRI which is reporting Spinal stenosis at C3-4. Advanced facet arthropathy .  Anterolisthesis of 6 mm. The cord may be compressed or partially compressed at this level -Neurology following -should their input - will need outpatient follow up with neurology and neurosurgery  - continue PT/OT and treatment of other chronic medical conditions - repeat spep/immunofixation pending,    Chronic systolic CHF  - CXR with cardiomegaly and pulm edema with small bilateral effusions and L greater than R bibasilar opacities (CXR stable) - cont. With  lasix every other day -DC Coreg, continue metoprolol - Follow daily wt and I/O's - Echo done earlier this month with EF 40-45%, diffuse HK, severe LAE, mild AI, mild AS, and mild pulm   Wt Readings from Last 3 Encounters:    11/19/17 103 kg (227 lb 1 oz)  11/12/17 106.4 kg (234 lb 9.1 oz)  10/31/17 103 kg (227 lb 1.2 oz)   Hypertension -Stable we will continue current medications  COPD with chronic hypoxic respiratory failure  - No wheezing on admission, has increased O2-requirement but denies SOB or cough  - Continue ICS/LABA, Spiriva, and prn albuterol  - Continue supplemental O2 (generally on 2 L, currently at baseline)  Atrial fibrillation  RVR  -Still in A. fib with RVR, heart rate about 110, was started on metoprolol, and the hope of sparing blood pressure - CHADS-VASc at least 3 (age x2, CHF)  - Follows with cardiology and has declined anticoagulation   -We will add small dose of diltiazem 30 mg p.o. every 8 hours -Monitor closely   Hypothyroidism  - Thyroid studies normal earlier this month  - Continue Synthroid     UTI  - Treated with Rocephin during recent hospitalization and transitioned to [facility] on discharge  - No fever on admission  - Completed course of omnicef  Elevated troponin  - Troponin is elevated to 0.11 in ED (down from previous hospitalization) -He continues to deny any chest pain or shortness of breath. - Continue cardiac monitoring, trend troponin, hold Coreg in light of hypotension   Hypokalemia:  -Mild, resolved , continue to monitor replete accordingly  Stage 1 decub: with foam dressing on, follow.  Frequent repositioning.  LE redness: doesn't appear infected, follow.    Full thickness wound to R  great toe: ensure outpatient follow up with ortho.  Seen by wound care.  LE edema: R>L is chronic, but appeared worse yesterday per daughter.  [place] (without DVT - cystic structure noted in upper calf medially on left). Restart lasix.  DVT prophylaxis: lovenox DC'd as to GI bleed, placed on SCDs Code Status: DNR Family Communication: wife/daughter at bedside Disposition Plan: pending improvement   Consultants:   none  Procedures:  LE [place] Final  Interpretation: Right: There is no evidence of deep vein thrombosis in the lower extremity. No cystic structure found in the popliteal fossa. Left: There is no evidence of deep vein thrombosis in the lower extremity. However, portions of this examination were limited- see technologist comments above. No cystic structure found in the popliteal fossa.  Antimicrobials:  Anti-infectives (From admission, onward)   Start     Dose/Rate Route Frequency Ordered Stop   11/14/17 0030  cefdinir ([name]) capsule 300 mg    Note to Pharmacy:  Patient taking differently: Take 300 mg by mouth two times a day for 3 days     300 mg Oral Every 12 hours 11/13/17 2038 11/16/17 0959      Subjective: Feeling better.  Ready to go.   Objective: Vitals:   11/18/17 2332 11/19/17 0300 11/19/17 0529 11/19/17 0801  BP:  119/82    Pulse:  78    Resp: 18 18    Temp:  98.2 F (36.8 C)    TempSrc:  Axillary    SpO2:  95%  98%  Weight:   103 kg (227 lb 1 oz)   Height:        Intake/Output Summary (Last 24 hours) at 11/19/2017 1356 Last data filed at 11/19/2017 0600 Gross per 24 hour  Intake 464 ml  Output 700 ml  Net -236 ml   Filed Weights   11/17/17 0533 11/18/17 0409 11/19/17 0529  Weight: 107.2 kg (236 lb 5.3 oz) 106.3 kg (234 lb 5.6 oz) 103 kg (227 lb 1 oz)    Examination:  General: Awake alert no acute distress Cardiovascular: S1-S2, irregularly irregular, tachycardia or any gallops Lungs: Shellfish but regular wheeze no crackles Abdomen: Soft, nontender nondistended positive bowel sounds  neurological: Lysed weakness is noted, alert oriented, right-sided weakness greater than left at baseline,  Sensation intact Skin: Warm to touch, bilateral lower extremity wound, negative any open wound Extremities: Unable to ambulate without assist, negative any clubbing or cyanosis, +2 pitting edema   Data Reviewed: I have personally reviewed following labs and imaging studies  CBC: Recent Labs  Lab  11/15/17 0426 11/16/17 0340 11/17/17 0609 11/18/17 0752 11/18/17 1457 11/19/17 0500  WBC 8.1 7.3 5.6 8.8  --  6.5  HGB 12.5* 12.5* 13.0 10.4* 9.8* 9.1*  HCT 38.3* 38.7* 39.9 31.8* 29.6* 27.9*  MCV 92.5 93.3 93.0 91.6  --  92.4  PLT 144* 157 192 196  --  948   Basic Metabolic Panel: Recent Labs  Lab 11/14/17 0245 11/15/17 0426 11/16/17 0340 11/17/17 0609 11/18/17 0752  NA 133* 131* 136 135 133*  K 3.4* 4.2 4.5 4.4 4.5  CL 94* 91* 94* 95* 95*  CO2 29 33* 35* 33* 28  GLUCOSE 137* 89 96 92 89  BUN 13 8 8 8 13   CREATININE 0.75 0.54* 0.75 0.73 0.77  CALCIUM 8.3* 8.2* 8.6* 8.7* 8.2*  MG 2.0 2.1 2.3 2.1 1.9   GFR: Estimated Creatinine Clearance: 79.8 mL/min (by C-G formula based on SCr of 0.77 mg/dL). Liver Function  Tests: Recent Labs  Lab 11/15/17 0426 11/16/17 0340  AST 26 28  ALT 24 23  ALKPHOS 88 93  BILITOT 0.8 0.7  PROT 5.5* 5.3*  ALBUMIN 2.3* 2.3*  Cardiac Enzymes: Recent Labs  Lab 11/13/17 2059 11/14/17 0245 11/14/17 0703  TROPONINI 0.10* 0.09* 0.10*   BNP (last 3 results) Recent Labs    04/07/17 1632  PROBNP 230.0*   HbA1C: No results for input(s): HGBA1C in the last 72 hours. CBG: Recent Labs  Lab 11/16/17 0731 11/16/17 1708 11/17/17 0551 11/18/17 0651 11/19/17 0722  GLUCAP 77 118* 83 86 97    Recent Labs    11/17/17 1519  VITAMINB12 835   Sepsis Labs: No results for input(s): PROCALCITON, LATICACIDVEN in the last 168 hours.  Recent Results (from the past 240 hour(s))  Urine culture     Status: Abnormal   Collection Time: 11/13/17  3:30 PM  Result Value Ref Range Status   Specimen Description URINE, RANDOM  Final   Special Requests   Final    NONE Performed at [facility], [address] [place], [place] [address]    Culture MULTIPLE SPECIES PRESENT, SUGGEST RECOLLECTION (A)  Final   Report Status 11/14/2017 FINAL  Final         Radiology Studies: Mr Cervical Spine Wo Contrast  Result Date:  11/18/2017 CLINICAL DATA:  Cervical spinal stenosis. EXAM: MRI CERVICAL SPINE WITHOUT CONTRAST TECHNIQUE: Multiplanar, multisequence MR imaging of the cervical spine was performed. No intravenous contrast was administered. COMPARISON:  Radiography 09/18/2016.  Previous MRI 2005. FINDINGS: Patient would not tolerate axial imaging. Only sagittal sequences were obtained. The patient would not tolerate thoracic spine imaging as was ordered. Alignment: 6 mm anterolisthesis C3-4. Vertebrae: Distant fusion at C4-5. Cord: Some cord deformity at the C3-4 level. AP diameter of the canal only approximately 6 mm. Posterior Fossa, vertebral arteries, paraspinal tissues: Negative Disc levels: Foramen magnum sufficiently patent. Ordinary osteoarthritis at C1-2 without stenosis. C2-3 shows facet arthropathy on the left but no central canal stenosis. C3-4 shows central canal stenosis because of facet arthropathy and 6 mm of anterolisthesis. As noted above, the cord could be partially compressed at this level though there is no visible abnormal T2 signal. C4-5 shows distant fusion with sufficient patency of the canal and foramina. C5-6 shows spondylosis with osteophytes that narrow the ventral subarachnoid space but do not compress the cord. C6-7 shows small osteophytes but no compressive stenosis. C7-T1 is normal. Upper thoracic region is unremarkable. IMPRESSION: Spinal stenosis at C3-4. Advanced facet arthropathy with anterolisthesis of 6 mm. The cord may be compressed or partially compressed at this level. See above discussion. Electronically Signed   By: Nelson Chimes [name]   On: 11/18/2017 14:00   Nm Gi Blood Loss  Result Date: 11/18/2017 CLINICAL DATA:  GI bleed EXAM: NUCLEAR MEDICINE GASTROINTESTINAL BLEEDING SCAN TECHNIQUE: Sequential abdominal images were obtained following intravenous administration of Tc-33m labeled red blood cells. RADIOPHARMACEUTICALS:  25 mCi Tc-5m pertechnetate in-vitro labeled red cells.  COMPARISON:  None. FINDINGS: Penile uptake. Excretory uptake in the bladder. No findings suspicious for active GI bleed. IMPRESSION: No findings suspicious for active GI bleed. Electronically Signed   By: Julian Hy [name]   On: 11/18/2017 21:53     Scheduled Meds: . aspirin EC  81 mg Oral Q M,W,F  . azelastine  1 spray Each Nare BID  . B-complex with vitamin C  1 tablet Oral Daily  . cholecalciferol  4,000 Units Oral Daily  .  diltiazem  30 mg Oral Q8H  . docusate sodium  100 mg Oral BID  . furosemide  60 mg Oral QODAY  . levothyroxine  100 mcg Oral QAC breakfast  . metoprolol tartrate  12.5 mg Oral BID  . mometasone-formoterol  2 puff Inhalation BID  . multivitamin with minerals  1 tablet Oral Daily  . neomycin-bacitracin-polymyxin   Topical Daily  . pantoprazole  40 mg Oral Q0600  . potassium chloride  10 mEq Oral Daily  . sodium chloride flush  3 mL Intravenous Q12H  . tiotropium  1 capsule Inhalation Daily   Continuous Infusions:    LOS: 6 days    Time spent: over [place], MD [facility] Pager [phone]   If 7PM-7AM, please contact night-coverage [email or web address] Password [email or web address] 11/19/2017, 1:56 PM

## 2017-11-20 ENCOUNTER — Encounter (HOSPITAL_COMMUNITY): Payer: Self-pay | Admitting: Radiology

## 2017-11-20 ENCOUNTER — Inpatient Hospital Stay (HOSPITAL_COMMUNITY): Payer: Medicare Other

## 2017-11-20 HISTORY — PX: IR ANGIOGRAM VISCERAL SELECTIVE: IMG657

## 2017-11-20 HISTORY — PX: IR US GUIDE VASC ACCESS RIGHT: IMG2390

## 2017-11-20 LAB — CBC WITH DIFFERENTIAL/PLATELET
Abs Immature Granulocytes: 0.1 10*3/uL (ref 0.0–0.1)
BASOS PCT: 0 %
Basophils Absolute: 0 10*3/uL (ref 0.0–0.1)
EOS ABS: 0.5 10*3/uL (ref 0.0–0.7)
EOS PCT: 5 %
HCT: 23 % — ABNORMAL LOW (ref 39.0–52.0)
Hemoglobin: 7.8 g/dL — ABNORMAL LOW (ref 13.0–17.0)
IMMATURE GRANULOCYTES: 1 %
Lymphocytes Relative: 22 %
Lymphs Abs: 2 10*3/uL (ref 0.7–4.0)
MCH: 30.7 pg (ref 26.0–34.0)
MCHC: 33.9 g/dL (ref 30.0–36.0)
MCV: 90.6 fL (ref 78.0–100.0)
MONOS PCT: 8 %
Monocytes Absolute: 0.8 10*3/uL (ref 0.1–1.0)
NEUTROS PCT: 64 %
Neutro Abs: 5.9 10*3/uL (ref 1.7–7.7)
PLATELETS: 235 10*3/uL (ref 150–400)
RBC: 2.54 MIL/uL — AB (ref 4.22–5.81)
RDW: 13.3 % (ref 11.5–15.5)
WBC: 9.3 10*3/uL (ref 4.0–10.5)

## 2017-11-20 LAB — IMMUNOFIXATION ELECTROPHORESIS
IGG (IMMUNOGLOBIN G), SERUM: 641 mg/dL — AB (ref 700–1600)
IGM (IMMUNOGLOBULIN M), SRM: 51 mg/dL (ref 15–143)
IgA: 419 mg/dL (ref 61–437)
Total Protein ELP: 5 g/dL — ABNORMAL LOW (ref 6.0–8.5)

## 2017-11-20 LAB — CBC
HCT: 27.3 % — ABNORMAL LOW (ref 39.0–52.0)
HCT: 23 % — ABNORMAL LOW (ref 39.0–52.0)
Hemoglobin: 9 g/dL — ABNORMAL LOW (ref 13.0–17.0)
Hemoglobin: 7.5 g/dL — ABNORMAL LOW (ref 13.0–17.0)
MCH: 30.2 pg (ref 26.0–34.0)
MCH: 30.1 pg (ref 26.0–34.0)
MCHC: 33 g/dL (ref 30.0–36.0)
MCHC: 32.6 g/dL (ref 30.0–36.0)
MCV: 91.6 fL (ref 78.0–100.0)
MCV: 92.4 fL (ref 78.0–100.0)
PLATELETS: 280 10*3/uL (ref 150–400)
Platelets: 234 10*3/uL (ref 150–400)
RBC: 2.98 MIL/uL — ABNORMAL LOW (ref 4.22–5.81)
RBC: 2.49 MIL/uL — AB (ref 4.22–5.81)
RDW: 13.5 % (ref 11.5–15.5)
RDW: 13.8 % (ref 11.5–15.5)
WBC: 8.9 10*3/uL (ref 4.0–10.5)
WBC: 11.3 10*3/uL — ABNORMAL HIGH (ref 4.0–10.5)

## 2017-11-20 LAB — GLUCOSE, CAPILLARY: Glucose-Capillary: 107 mg/dL — ABNORMAL HIGH (ref 65–99)

## 2017-11-20 LAB — HEMOGLOBIN AND HEMATOCRIT, BLOOD
HCT: 23.5 % — ABNORMAL LOW (ref 39.0–52.0)
HEMOGLOBIN: 7.7 g/dL — AB (ref 13.0–17.0)

## 2017-11-20 LAB — ABO/RH: ABO/RH(D): O POS

## 2017-11-20 LAB — PREPARE RBC (CROSSMATCH)

## 2017-11-20 MED ORDER — FENTANYL CITRATE (PF) 100 MCG/2ML IJ SOLN
INTRAMUSCULAR | Status: AC | PRN
  Administered 2017-11-20 (×6): 25 ug via INTRAVENOUS

## 2017-11-20 MED ORDER — IOPAMIDOL (ISOVUE-300) INJECTION 61%
INTRAVENOUS | Status: AC
  Administered 2017-11-20: 25 mL
  Filled 2017-11-20: qty 50

## 2017-11-20 MED ORDER — TECHNETIUM TC 99M-LABELED RED BLOOD CELLS IV KIT: 25.0000 | PACK | Freq: Once | INTRAVENOUS | Status: DC | PRN

## 2017-11-20 MED ORDER — LIDOCAINE HCL (PF) 1 % IJ SOLN
INTRAMUSCULAR | Status: AC | PRN
  Administered 2017-11-20: 5 mL

## 2017-11-20 MED ORDER — TECHNETIUM TC 99M-LABELED RED BLOOD CELLS IV KIT
25.0000 | PACK | Freq: Once | INTRAVENOUS | Status: AC | PRN
  Administered 2017-11-20: 25 via INTRAVENOUS

## 2017-11-20 MED ORDER — MIDAZOLAM HCL 2 MG/2ML IJ SOLN
INTRAMUSCULAR | Status: AC
  Filled 2017-11-20: qty 2

## 2017-11-20 MED ORDER — FENTANYL CITRATE (PF) 100 MCG/2ML IJ SOLN
INTRAMUSCULAR | Status: AC
  Filled 2017-11-20: qty 2

## 2017-11-20 MED ORDER — MIDAZOLAM HCL 2 MG/2ML IJ SOLN
INTRAMUSCULAR | Status: AC | PRN
  Administered 2017-11-20 (×3): 0.5 mg via INTRAVENOUS

## 2017-11-20 MED ORDER — LIDOCAINE HCL 1 % IJ SOLN
INTRAMUSCULAR | Status: AC
  Filled 2017-11-20: qty 20

## 2017-11-20 MED ORDER — SODIUM CHLORIDE 0.9 % IV SOLN: Freq: Once | INTRAVENOUS | Status: DC

## 2017-11-20 MED ORDER — IOPAMIDOL (ISOVUE-300) INJECTION 61%
INTRAVENOUS | Status: AC
  Administered 2017-11-20: 80 mL
  Filled 2017-11-20: qty 200

## 2017-11-20 NOTE — Progress Notes (Signed)
Pt just had another episode of bloody pass from the rectum, MD was present and saw it. GI PA paged per MD.

## 2017-11-20 NOTE — Procedures (Signed)
Interventional Radiology Procedure Note  Procedure: 3 vessel mesenteric angiogram: negative for acute bleeding  Vascular access:  Right CFA 69F --> AngioSeal  Complications: None  Estimated Blood Loss: None  Recommendations: - Bedrest with leg straight x 4 hrs - Watch for signs of recurrent bleeding - Trend H&H and transfuse as needed  Signed,  Criselda Peaches, MD

## 2017-11-20 NOTE — Progress Notes (Addendum)
PROGRESS NOTE    Richard Rosales  DGU:[id number] DOB: 03-02-1931 DOA: 11/13/2017 PCP: Unk Pinto, MD   Subjective;  Patient was seen and examined this morning, awake alert, found mildly hypotensive complaining of generalized weaknesses but no dizziness.  Report of multiple bloody bowel movements.   At present patient had another episode of liquidy blood rectal bleed, bright red. GI already has ordered tagged RBC scan.  Verbal consent was obtained from the patient for blood transfusion as he is becoming symptomatic with hyportension, progressively becoming weak.  But denies any visual changes chest pain.  The permission of the patient, his son was called at the bedside at phone # [phone] Findings were discussed in detail with him.  He expressed understanding and agree with the plan.  ============================================================================  Brief Narrative:  Richard Rosales is a 84 y.o. male with medical history significant for chronic systolic CHF, atrial fibrillation not on anticoagulation, hypothyroidism, COPD with chronic hypoxic respiratory failure, and UTI and antibiotics, now presenting for evaluation of syncope.  Patient was discharged from the hospital yesterday after management of the lethargy, hypokalemia, and UTI.  His condition was in part attributed to recent diuresis and the patient was hydrated while in the hospital.  He was treated with antibiotics and discharged back to his SNF yesterday to complete a course of [place].  He reports feeling well while at rest, but becomes acutely lightheaded while sitting up or trying to stand.  He had a syncopal episode today while trying to participate in physical therapy.  He denies any chest pain or palpitations and denies shortness of breath, though he is requiring increased supplemental oxygen.  Presented with syncope 2/2 orthostatic hypotension thought 2/2 hypovolemia from diuresis vs autonomic dysfunction  from peripheral neuropathy.  Has LE weakness likely related to spinal stenosis and peripheral neuropathy.  Neurology asked to evaluate on 5/28, recommending MRI.  Hopefully after this w/u complete, maybe able to get him back to [facility] on 5/29. ----------------------------------------------------------------------------------------------------------------------------------------- Assessment & Plan:   Principal Problem:   Syncope Active Problems:   OSA and COPD overlap syndrome (HCC)   Essential hypertension   COPD (chronic obstructive pulmonary disease) (HCC)   Hypothyroidism   A-fib (HCC)   Acute on chronic systolic CHF (congestive heart failure) (HCC)   Elevated troponin   UTI (urinary tract infection)   Pressure injury of skin   Orthostatic hypotension   GI bleed   Acute blood loss anemia  Acute GI bleed/? rectal bleed -Multiple bloody bowel movement, GI thinks it is possible from his previous diverticula disease. -Initial tagged RBC scan on 5/29 was negative, another one was ordered for today. -GI following closely - Patient's hemoglobin dropped from 13-->10--> 9.8-->9.1 ..> 7.7  today  -Blood pressure dropping to 84/57, denies any chest pain or shortness of breath  -Consent was obtained from the patient and his son for blood transfusion Pros and cons of blood transfusion was discussed, agreed with pursuing with 2 units of packed red blood cell transfusion.  Addendum: Repeat NM tagged RBC scan positive for site of bleeding was identified, interventional radiologist Dr. Laurence Ferrari has been called, he is to see the patient urgently for possible embolectomy   Syncope / due to orthostatic hypotension due to hypovolemia or autonomic dysfunction related to polyneuropathy -Stable no episodes  - Presents from SNF after a syncopal episode.  - He was just discharged from the hospital, at SNF reportedly been orthostatic with reported 50 mmHg drop in SBP on standing at his  SNF and he had  a syncopal episode while during PT  - Patient denied chest pain, palpitations, or any other complaints  - Initially had lasix and coreg held and was hydrated -patient medication has been changed to Lasix every other day, metoprolol switched to Coreg  -- Only able to get lying and sitting orthostatics  (not able to get standing), but these were negative - Echo done earlier this month with EF 40-45%, diffuse HK, severe LAE, mild AI, mild AS, and mild pulm HTN  - MRI brain notable for atrophy and white matter changes moderately advanced for age -Could not complete MRI of the spine, was only able to complete cervical MRI which revealed stenosis at C3/4   Generalized Weakness:  - Multifactorial.  Likely some contribution from spinal stenosis, heart failure, and peripheral neuropathy.  Not a great candidate for and surgery for spinal disease.  Had appointment with Dr. [name] as outpatient that he missed as result of his recent hospitalizations.  Discussed again with Dr. 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 [name] note from 12/04/2016.  MRI brain notable for atrophy and white matter changes moderately advance for age - nonspecific finding (See report)  - Still with R>L LE weakness today, but also noticed some involuntary jerking of his LE which I hadn't noticed before,  -Unfortunately patient is unable to complete MRI of the spine due to uncomfortable position on the table  -He did manage to get a cervical spine MRI which is reporting Spinal stenosis at C3-4. Advanced facet arthropathy .  Anterolisthesis of 6 mm. The cord may be compressed or partially compressed at this level -Neurology following -should their  input - will need outpatient follow up with neurology and neurosurgery  - continue PT/OT and treatment of other chronic medical conditions - repeat spep/immunofixation pending,    Chronic systolic CHF  - CXR with cardiomegaly and pulm edema with small bilateral effusions and L greater than R bibasilar opacities (CXR stable) - cont. With  lasix every other day -DC Coreg, continue metoprolol - Follow daily wt and I/O's - Echo done earlier this month with EF 40-45%, diffuse HK, severe LAE, mild AI, mild AS, and mild pulm   Wt Readings from Last 3 Encounters:  11/20/17 104.2 kg (229 lb 11.5 oz)  11/12/17 106.4 kg (234 lb 9.1 oz)  10/31/17 103 kg (227 lb 1.2 oz)   Hypertension -Stable we will continue current medications  COPD with chronic hypoxic respiratory failure  - No wheezing on admission, has increased O2-requirement but denies SOB or cough  - Continue ICS/LABA, Spiriva, and prn albuterol  - Continue supplemental O2 (generally on 2 L, currently at baseline)  Atrial fibrillation  RVR  -Still in A. fib with RVR, heart rate about 110, was started on metoprolol, and the hope of sparing blood pressure - CHADS-VASc at least 3 (age x2, CHF)  - Follows with cardiology and has declined anticoagulation   -We will add small dose of diltiazem 30 mg p.o. every 8 hours -Monitor closely   Hypothyroidism  - Thyroid studies normal earlier this month  - Continue Synthroid     UTI  - Treated with Rocephin during recent hospitalization and transitioned to  [name] on discharge  - No fever on admission  - Completed course of omnicef  Elevated troponin  - Troponin is elevated to 0.11 in ED (down from previous hospitalization) -He continues to deny any chest pain or shortness of breath. - Continue cardiac monitoring, trend troponin, hold Coreg in light of hypotension   Hypokalemia:  -Mild, resolved , continue to monitor replete accordingly  Stage 1 decub: with foam dressing on,  follow.  Frequent repositioning.  LE redness: doesn't appear infected, follow.    Full thickness wound to R great toe: ensure outpatient follow up with ortho.  Seen by wound care.  LE edema: R>L is chronic, but appeared worse yesterday per daughter.  [place] (without DVT - cystic structure noted in upper calf medially on left). Restart lasix.  DVT prophylaxis: lovenox DC'd as to GI bleed, placed on SCDs Code Status: DNR Family Communication: wife/daughter at bedside Disposition Plan: pending improvement   Consultants:   none  Procedures:  LE [place] Final Interpretation: Right: There is no evidence of deep vein thrombosis in the lower extremity. No cystic structure found in the popliteal fossa. Left: There is no evidence of deep vein thrombosis in the lower extremity. However, portions of this examination were limited- see technologist comments above. No cystic structure found in the popliteal fossa.  Antimicrobials:  Anti-infectives (From admission, onward)   Start     Dose/Rate Route Frequency Ordered Stop   11/14/17 0030  cefdinir ([name]) capsule 300 mg    Note to Pharmacy:  Patient taking differently: Take 300 mg by mouth two times a day for 3 days     300 mg Oral Every 12 hours 11/13/17 2038 11/16/17 0959      Subjective: Feeling better.  Ready to go.   Objective: Vitals:   11/20/17 0804 11/20/17 0848 11/20/17 0951 11/20/17 1108  BP:  91/60 105/64 (!) 84/57  Pulse:  76  74  Resp:  20  20  Temp:  98.4 F (36.9 C)    TempSrc:  Oral    SpO2: 96% 91%  100%  Weight:      Height:        Intake/Output Summary (Last 24 hours) at 11/20/2017 1231 Last data filed at 11/20/2017 7425 Gross per 24 hour  Intake 240 ml  Output 750 ml  Net -510 ml   Filed Weights   11/18/17 0409 11/19/17 0529 11/20/17 0512  Weight: 106.3 kg (234 lb 5.6 oz) 103 kg (227 lb 1 oz) 104.2 kg (229 lb 11.5 oz)    BP (!) 84/57 (BP Location: Left Arm)   Pulse 74   Temp 98.4 F (36.9 C) (Oral)   Resp  20   Ht 5\' 9"  (1.753 m)   Wt 104.2 kg (229 lb 11.5 oz)   SpO2 100%   BMI 33.92 kg/m    Physical Exam  Constitution:  Alert, cooperative, no distress,  Psychiatric: Normal and stable mood and affect, cognition intact,   HEENT: Normocephalic, PERRL, otherwise with in Normal limits  Cardio vascular:  S1/S2, RRR, No murmure, No Rubs or Gallops  Chest/pulmonary: Clear to auscultation bilaterally, respirations unlabored  Chest symmetric Abdomen: Soft, non-tender, non-distended, bowel sounds,no masses, no organomegaly Muscular skeletal: Limited exam - in bed, able to move all 4 extremities, reduced strength left lower extremity than right.  Chronically unable to abduct his arms or his shoulders,  good grip no changes Neuro: CNII-XII intact. , normal motor and sensation, reflexes intact  Extremities: No pitting edema lower  extremities, +2 pulses  Skin: Dry, warm to touch, negative for any Rashes, No open wounds       Data Reviewed: I have personally reviewed following labs and imaging studies  CBC: Recent Labs  Lab 11/17/17 0609 11/18/17 0752 11/18/17 1457 11/19/17 0500 11/20/17 0151 11/20/17 0722 11/20/17 1044  WBC 5.6 8.8  --  6.5 8.9 9.3  --   NEUTROABS  --   --   --   --   --  5.9  --   HGB 13.0 10.4* 9.8* 9.1* 9.0* 7.8* 7.7*  HCT 39.9 31.8* 29.6* 27.9* 27.3* 23.0* 23.5*  MCV 93.0 91.6  --  92.4 91.6 90.6  --   PLT 192 196  --  188 234 235  --    Basic Metabolic Panel: Recent Labs  Lab 11/14/17 0245 11/15/17 0426 11/16/17 0340 11/17/17 0609 11/18/17 0752  NA 133* 131* 136 135 133*  K 3.4* 4.2 4.5 4.4 4.5  CL 94* 91* 94* 95* 95*  CO2 29 33* 35* 33* 28  GLUCOSE 137* 89 96 92 89  BUN 13 8 8 8 13   CREATININE 0.75 0.54* 0.75 0.73 0.77  CALCIUM 8.3* 8.2* 8.6* 8.7* 8.2*  MG 2.0 2.1 2.3 2.1 1.9   GFR: Estimated Creatinine Clearance: 80.3 mL/min (by C-G formula based on SCr of 0.77 mg/dL). Liver Function Tests: Recent Labs  Lab 11/15/17 0426 11/16/17 0340  AST  26 28  ALT 24 23  ALKPHOS 88 93  BILITOT 0.8 0.7  PROT 5.5* 5.3*  ALBUMIN 2.3* 2.3*  Cardiac Enzymes: Recent Labs  Lab 11/13/17 2059 11/14/17 0245 11/14/17 0703  TROPONINI 0.10* 0.09* 0.10*   BNP (last 3 results) Recent Labs    04/07/17 1632  PROBNP 230.0*   HbA1C: No results for input(s): HGBA1C in the last 72 hours. CBG: Recent Labs  Lab 11/16/17 1708 11/17/17 0551 11/18/17 0651 11/19/17 0722 11/20/17 0757  GLUCAP 118* 83 86 97 107*    Recent Labs    11/17/17 1519  VITAMINB12 835   Sepsis Labs: No results for input(s): PROCALCITON, LATICACIDVEN in the last 168 hours.  Recent Results (from the past 240 hour(s))  Urine culture     Status: Abnormal   Collection Time: 11/13/17  3:30 PM  Result Value Ref Range Status   Specimen Description URINE, RANDOM  Final   Special Requests   Final    NONE Performed at [facility], [address] [place], [place] [address]    Culture MULTIPLE SPECIES PRESENT, SUGGEST RECOLLECTION (A)  Final   Report Status 11/14/2017 FINAL  Final         Radiology Studies: Nm Gi Blood Loss  Result Date: 11/18/2017 CLINICAL DATA:  GI bleed EXAM: NUCLEAR MEDICINE GASTROINTESTINAL BLEEDING SCAN TECHNIQUE: Sequential abdominal images were obtained following intravenous administration of Tc-75m labeled red blood cells. RADIOPHARMACEUTICALS:  25 mCi Tc-62m pertechnetate in-vitro labeled red cells. COMPARISON:  None. FINDINGS: Penile uptake. Excretory uptake in the bladder. No findings suspicious for active GI bleed. IMPRESSION: No findings suspicious for active GI bleed. Electronically Signed   By: Julian Hy [name]   On: 11/18/2017 21:53     Scheduled Meds: . aspirin EC  81 mg Oral Q M,W,F  . azelastine  1 spray Each Nare BID  . B-complex with vitamin C  1 tablet Oral Daily  . cholecalciferol  4,000 Units Oral Daily  . diltiazem  30 mg Oral Q8H  . docusate sodium  100 mg Oral BID  .  furosemide  60 mg Oral QODAY  .  levothyroxine  100 mcg Oral QAC breakfast  . metoprolol tartrate  12.5 mg Oral BID  . mometasone-formoterol  2 puff Inhalation BID  . multivitamin with minerals  1 tablet Oral Daily  . neomycin-bacitracin-polymyxin   Topical Daily  . pantoprazole  40 mg Oral Q0600  . potassium chloride  10 mEq Oral Daily  . sodium chloride flush  3 mL Intravenous Q12H  . tiotropium  1 capsule Inhalation Daily   Continuous Infusions: . sodium chloride       LOS: 7 days    Time spent: over [place], MD [facility] Pager [phone]   If 7PM-7AM, please contact night-coverage [email or web address] Password [facility] 11/20/2017, 12:31 PM

## 2017-11-20 NOTE — Sedation Documentation (Signed)
6 Fr. Fuller Mandril to right groin

## 2017-11-20 NOTE — Progress Notes (Addendum)
Chief Complaint: Patient was seen in consultation today for  at the request of Dr. Roger Shelter  Referring Physician(s): Dr. Cherlyn Labella  Supervising Physician: Jacqulynn Cadet  Patient Status: [facility] - In-pt  History of Present Illness: Richard Rosales is a 84 y.o. male with recurrent hematochezia. His Hgb has dropped again from 13--->7.8 and he continues to have bloody BMs, most recently less than 1 hour ago. Had a Tagged RBC NM scan a couple days ago, which was negative, however, given his drop in Hgb and hypotension, a repeat scan was performed today. This scan is positive for active bleed in the region of the splenic flexure. IR is asked to proceed with visceral angiogram and possible embolization. Chart, meds, labs, imaging reviewed. Pt has been NPO today. No family at bedside  Past Medical History:  Diagnosis Date  . Acute bronchitis   . Adenomatous colon polyp   . Allergic rhinitis   . Anemia    iron deficient  . Atrial fibrillation ([place])   . Atrial flutter ([place])   . Chronic gastritis   . COPD (chronic obstructive pulmonary disease) ([place])   . Diastolic dysfunction   . Diverticulosis of colon    recurrent GI bleeding  . DJD (degenerative joint disease)   . GERD (gastroesophageal reflux disease)   . GI bleed   . HTN (hypertension)   . Hyperplastic colon polyp 2003  . Hypothyroidism   . Moderate aortic insufficiency   . OSA (obstructive sleep apnea)    compliants w/ CPAP  . Positive PPD    remote  . Shortness of breath   . Venous insufficiency     Past Surgical History:  Procedure Laterality Date  . CARDIAC CATHETERIZATION  7.27.05   revealed a preserved EF with no CAD  . COLONOSCOPY    . HERNIA REPAIR Bilateral   . JOINT REPLACEMENT  2001   lt total knee-rt one done 00  . NASAL SINUS SURGERY    . POLYPECTOMY     adenomatous polyps 2010  . SHOULDER ARTHROSCOPY    . TOE SURGERY Right   . TONSILLECTOMY    . TOTAL KNEE ARTHROPLASTY     bilateral     Allergies: Other; Ace inhibitors; Beta adrenergic blockers; Decadron [dexamethasone]; Fluticasone-salmeterol; Hytrin [terazosin]; Prednisone; and Vioxx [rofecoxib]  Medications:  Current Facility-Administered Medications:  .  0.9 %  sodium chloride infusion, , Intravenous, Once, Shahmehdi, Seyed A, MD .  acetaminophen (TYLENOL) tablet 650 mg, 650 mg, Oral, Q6H PRN, 650 mg at 11/19/17 2231 **OR** acetaminophen (TYLENOL) suppository 650 mg, 650 mg, Rectal, Q6H PRN, Opyd, Timothy S, MD .  albuterol (PROVENTIL) (2.5 MG/3ML) 0.083% nebulizer solution 2.5 mg, 2.5 mg, Nebulization, Q4H PRN, Opyd, Timothy S, MD .  aspirin EC tablet 81 mg, 81 mg, Oral, Q M,W,F, Opyd, Ilene Qua, MD, 81 mg at 11/20/17 0943 .  azelastine (ASTELIN) 0.1 % nasal spray 1 spray, 1 spray, Each Nare, BID, Elodia Florence., MD, 1 spray at 11/20/17 0945 .  B-complex with vitamin C tablet 1 tablet, 1 tablet, Oral, Daily, Opyd, Ilene Qua, MD, 1 tablet at 11/20/17 0944 .  bisacodyl (DULCOLAX) EC tablet 5 mg, 5 mg, Oral, Daily PRN, Opyd, Ilene Qua, MD .  calcium carbonate (TUMS - dosed in mg elemental calcium) chewable tablet 400 mg of elemental calcium, 2 tablet, Oral, TID PRN, Opyd, Ilene Qua, MD .  cholecalciferol (VITAMIN D) tablet 4,000 Units, 4,000 Units, Oral, Daily, Opyd, Ilene Qua, MD, 4,000 Units at  11/20/17 0943 .  diazepam (VALIUM) tablet 5 mg, 5 mg, Oral, Q12H PRN, Shahmehdi, Seyed A, MD .  diltiazem (CARDIZEM) tablet 30 mg, 30 mg, Oral, Q8H, Shahmehdi, Seyed A, MD, 30 mg at 11/20/17 0600 .  docusate sodium (COLACE) capsule 100 mg, 100 mg, Oral, BID, Opyd, Ilene Qua, MD, 100 mg at 11/20/17 0944 .  furosemide (LASIX) tablet 60 mg, 60 mg, Oral, QODAY, Elodia Florence., MD, 60 mg at 11/19/17 0858 .  levothyroxine (SYNTHROID, LEVOTHROID) tablet 100 mcg, 100 mcg, Oral, QAC breakfast, Opyd, Ilene Qua, MD, 100 mcg at 11/20/17 0944 .  metoprolol tartrate (LOPRESSOR) tablet 12.5 mg, 12.5 mg, Oral, BID, Elodia Florence., MD, 12.5 mg at 11/20/17 0950 .  mometasone-formoterol (DULERA) 200-5 MCG/ACT inhaler 2 puff, 2 puff, Inhalation, BID, Opyd, Ilene Qua, MD, 2 puff at 11/20/17 0804 .  multivitamin with minerals tablet 1 tablet, 1 tablet, Oral, Daily, Opyd, Ilene Qua, MD, 1 tablet at 11/20/17 0944 .  neomycin-bacitracin-polymyxin (NEOSPORIN) ointment, , Topical, Daily, Elodia Florence., MD .  oxyCODONE (Oxy IR/ROXICODONE) immediate release tablet 2.5 mg, 2.5 mg, Oral, Q4H PRN, Elodia Florence., MD, 2.5 mg at 11/20/17 0048 .  pantoprazole (PROTONIX) EC tablet 40 mg, 40 mg, Oral, Q0600, Vena Rua, PA-C, 40 mg at 11/20/17 0600 .  potassium chloride (K-DUR,KLOR-CON) CR tablet 10 mEq, 10 mEq, Oral, Daily, Opyd, Ilene Qua, MD, 10 mEq at 11/20/17 0943 .  senna-docusate (Senokot-S) tablet 1 tablet, 1 tablet, Oral, QHS PRN, Opyd, Timothy S, MD .  sodium chloride flush (NS) 0.9 % injection 3 mL, 3 mL, Intravenous, Q12H, Opyd, Ilene Qua, MD, 3 mL at 11/20/17 0946 .  sodium phosphate (FLEET) 7-19 GM/118ML enema 1 enema, 1 enema, Rectal, Once PRN, Opyd, Timothy S, MD .  technetium labeled red blood cells (ULTRATAG) injection kit 25 millicurie, 25 millicurie, Intravenous, Once PRN, Gribbin, Sarah J, PA-C .  tiotropium [facility]) inhalation capsule 18 mcg, 1 capsule, Inhalation, Daily, Opyd, Ilene Qua, MD, 18 mcg at 11/20/17 0740    Family History  Problem Relation Age of Onset  . Hypertension Mother   . Diabetes Father   . Heart failure Father   . Arrhythmia Sister 43  . Lung cancer Brother        chemical    Social History   Socioeconomic History  . Marital status: Married    Spouse name: Not on file  . Number of children: 4  . Years of education: Not on file  . Highest education level: Not on file  Occupational History  . Occupation: part-time as a Theme park manager at [employer]  . Financial resource strain: Not on file  . Food insecurity:    Worry: Not on file      Inability: Not on file  . Transportation needs:    Medical: Not on file    Non-medical: Not on file  Tobacco Use  . Smoking status: Former Smoker    Packs/day: 1.00    Years: 20.00    Pack years: 20.00    Types: Cigarettes    Last attempt to quit: 08/01/1978    Years since quitting: 39.3  . Smokeless tobacco: Never Used  Substance and Sexual Activity  . Alcohol use: Yes    Alcohol/week: 0.0 oz    Comment: small glass of wine daily  . Drug use: No  . Sexual activity: Not on file  Lifestyle  . Physical activity:    Days  per week: Not on file    Minutes per session: Not on file  . Stress: Not on file  Relationships  . Social connections:    Talks on phone: Not on file    Gets together: Not on file    Attends religious service: Not on file    Active member of club or organization: Not on file    Attends meetings of clubs or organizations: Not on file    Relationship status: Not on file  Other Topics Concern  . Not on file  Social History Narrative   Patient lives in [place]   Retired [employer] but works part-time as a Theme park manager at [employer] in [place].     Review of Systems: A 12 point ROS discussed and pertinent positives are indicated in the HPI above.  All other systems are negative.  Review of Systems  Vital Signs: BP (!) 84/57 (BP Location: Left Arm)   Pulse 74   Temp 98.4 F (36.9 C) (Oral)   Resp 20   Ht _0  (1.753 m)   Wt 229 lb 11.5 oz (104.2 kg)   SpO2 100%   BMI 33.92 kg/m   Physical Exam  Constitutional: He is oriented to person, place, and time. He appears well-developed. No distress.  HENT:  Head: Normocephalic.  Mouth/Throat: Oropharynx is clear and moist.  Neck: Normal range of motion. No tracheal deviation present.  Cardiovascular: Normal rate, regular rhythm, normal heart sounds and intact distal pulses.  Pulmonary/Chest: Effort normal and breath sounds normal. No respiratory distress.  Abdominal: Soft.  There is no tenderness.  Neurological: He is alert and oriented to person, place, and time.  Skin: Skin is warm and dry. He is not diaphoretic.  Psychiatric: He has a normal mood and affect. Judgment normal.    Imaging: Dg Chest 2 View  Result Date: 10/25/2017 CLINICAL DATA:  84 year old male with history of weakness. EXAM: CHEST - 2 VIEW COMPARISON:  Chest x-ray 04/07/2017. FINDINGS: In volumes are low. No consolidative airspace disease. No pleural effusions. Cephalization of the pulmonary vasculature, without frank pulmonary edema. Enlargement of the cardiopericardial silhouette, which could reflect underlying cardiomegaly and/or the presence of a pericardial effusion. Upper mediastinal contours are within normal limits. Aortic atherosclerosis. IMPRESSION: 1. Enlarged cardiopericardial silhouette, which could reflect cardiomegaly and/or presence of pericardial effusion. If, further evaluation there is clinical concern for pericardial effusion with echocardiography is suggested. 2. Pulmonary venous congestion, without frank pulmonary edema. 3. Aortic atherosclerosis. Electronically Signed   By: Vinnie Langton [name]   On: 10/25/2017 13:17   Ct Head Wo Contrast  Result Date: 10/26/2017 CLINICAL DATA:  Leg weakness for 4 days. EXAM: CT HEAD WITHOUT CONTRAST TECHNIQUE: Contiguous axial images were obtained from the base of the skull through the vertex without intravenous contrast. COMPARISON:  11/11/2013 FINDINGS: Brain: Chronic moderate small vessel ischemic disease of periventricular white matter. Age appropriate involutional changes of the brain. No hydrocephalus. No effacement of the basal cisterns. Cerebellum and brainstem are stable and intact. No intra-axial mass nor extra-axial fluid collections. No large vascular territory infarct. Chronic small lacunar infarct involving the anterior limb of the left internal capsule as before. Vascular: No hyperdense vessel or unexpected calcification. Skull:  Negative for fracture or focal lesion. Sinuses/Orbits: Status post bilateral maxillary antrectomies. Minimal ethmoid and sphenoid sinus mucosal thickening. Intact orbits and globes. Other: None IMPRESSION: 1. Chronic microvascular ischemic disease and old left lacunar infarct of the anterior limb of the internal capsule. No  acute intracranial abnormality. 2. Status post bilateral maxillary antrectomy. Electronically Signed   By: Ashley Royalty [name]   On: 10/26/2017 19:53   Mr Brain Wo Contrast  Result Date: 11/15/2017 CLINICAL DATA:  Syncope, simple, abnormal neuro exam. Recent hospital admission for lethargy and urinary tract infection. Syncopal episode at skilled nursing facility. EXAM: MRI HEAD WITHOUT CONTRAST TECHNIQUE: Multiplanar, multiecho pulse sequences of the brain and surrounding structures were obtained without intravenous contrast. COMPARISON:  CT head without contrast 10/26/2017 FINDINGS: Brain: Mild generalized atrophy is present. Moderate diffuse periventricular and subcortical T2 hyperintensities are present bilaterally. Ventricles are proportionate to the degree of atrophy. No significant extra-axial fluid collection is present. The internal auditory canals are within normal limits bilaterally. The brainstem and cerebellum are normal. Vascular: Flow is present in the major intracranial arteries. Skull and upper cervical spine: Skull base is within normal limits. Exaggerated cervical lordosis is present. There is focal central canal stenosis at C3-4 with grade 1 anterolisthesis. Midline sagittal structures are otherwise unremarkable. Sinuses/Orbits: Bilateral maxillary antrostomies are present. No significant residual recurrent sinus disease is present. Small polyps are present within the sphenoid sinuses bilaterally. The mastoid air cells are clear. Globes and orbits are within normal limits. IMPRESSION: 1. No acute intracranial abnormality. 2. Atrophy and white matter changes are moderately  advanced for age. The finding is nonspecific but can be seen in the setting of chronic microvascular ischemia, a demyelinating process such as multiple sclerosis, vasculitis, complicated migraine headaches, or as the sequelae of a prior infectious or inflammatory process. 3. Moderate central canal stenosis at C3-4. 4. Previous sinus surgery. Electronically Signed   By: San Morelle [name]   On: 11/15/2017 11:19   Mr Cervical Spine Wo Contrast  Result Date: 11/18/2017 CLINICAL DATA:  Cervical spinal stenosis. EXAM: MRI CERVICAL SPINE WITHOUT CONTRAST TECHNIQUE: Multiplanar, multisequence MR imaging of the cervical spine was performed. No intravenous contrast was administered. COMPARISON:  Radiography 09/18/2016.  Previous MRI 2005. FINDINGS: Patient would not tolerate axial imaging. Only sagittal sequences were obtained. The patient would not tolerate thoracic spine imaging as was ordered. Alignment: 6 mm anterolisthesis C3-4. Vertebrae: Distant fusion at C4-5. Cord: Some cord deformity at the C3-4 level. AP diameter of the canal only approximately 6 mm. Posterior Fossa, vertebral arteries, paraspinal tissues: Negative Disc levels: Foramen magnum sufficiently patent. Ordinary osteoarthritis at C1-2 without stenosis. C2-3 shows facet arthropathy on the left but no central canal stenosis. C3-4 shows central canal stenosis because of facet arthropathy and 6 mm of anterolisthesis. As noted above, the cord could be partially compressed at this level though there is no visible abnormal T2 signal. C4-5 shows distant fusion with sufficient patency of the canal and foramina. C5-6 shows spondylosis with osteophytes that narrow the ventral subarachnoid space but do not compress the cord. C6-7 shows small osteophytes but no compressive stenosis. C7-T1 is normal. Upper thoracic region is unremarkable. IMPRESSION: Spinal stenosis at C3-4. Advanced facet arthropathy with anterolisthesis of 6 mm. The cord may be compressed  or partially compressed at this level. See above discussion. Electronically Signed   By: Nelson Chimes [name]   On: 11/18/2017 14:00   Mr Lumbar Spine Wo Contrast  Result Date: 10/26/2017 CLINICAL DATA:  Extreme low back pain. EXAM: MRI LUMBAR SPINE WITHOUT CONTRAST TECHNIQUE: Multiplanar, multisequence MR imaging of the lumbar spine was performed. No intravenous contrast was administered. COMPARISON:  MRI lumbar spine 08/05/2010. FINDINGS: There is extreme motion degradation. Study is marginally diagnostic. Segmentation: Standard. Alignment: Exaggerated lumbar  lordosis. 2 mm anterolisthesis L5-S1. 2 mm compensatory retrolisthesis L3-4 L2-3 and trace L1-2. Vertebrae: No worrisome osseous lesion. Endplate reactive changes related to disc space narrowing. Conus medullaris and cauda equina: Conus extends to the L1. level. Conus and cauda equina appear normal. Paraspinal and other soft tissues: Renal cystic disease, poorly visualized. Disc levels: L1-L2: Disc space narrowing. Central protrusion. Facet arthropathy. No definite impingement. L2-L3: 2 mm retrolisthesis. Central protrusion. Posterior element hypertrophy. Moderate stenosis. BILATERAL L2 and L3 nerve root impingement likely. L3-L4: Disc space narrowing. Central protrusion. Posterior element hypertrophy. Moderate to severe stenosis. BILATERAL L3 and L4 nerve root impingement. L4-L5: Disc space narrowing. Central protrusion. Posterior element hypertrophy. Severe stenosis. BILATERAL L4 and L5 nerve root impingement. L5-S1: 2 mm anterolisthesis. Annular bulge. Posterior element hypertrophy. No definite impingement. IMPRESSION: Marginally diagnostic study demonstrating moderate to severe stenosis throughout the L2-3 through L4-5 disc spaces related to subluxations, disc space narrowing, disc protrusions, and posterior element hypertrophy. Symptomatic neural impingement could occur at any or all of those levels. See discussion above. Suspected progression since  previous MR from 2012. Electronically Signed   By: Staci Righter [name]   On: 10/26/2017 20:45   Nm Gi Blood Loss  Result Date: 11/20/2017 CLINICAL DATA:  Gastrointestinal bleeding. EXAM: NUCLEAR MEDICINE GASTROINTESTINAL BLEEDING SCAN TECHNIQUE: Sequential abdominal images were obtained following intravenous administration of Tc-101mlabeled red blood cells. RADIOPHARMACEUTICALS:  25 mCi Tc-983mertechnetate in-vitro labeled red cells. COMPARISON:  11/18/2017. FINDINGS: Active gastrointestinal bleeding in the left upper quadrant of the abdomen with tracer extending through tortuous bowel loops in the left abdomen to the pelvis. This is following the expected course of the colon with some reflux into the distal transverse colon. IMPRESSION: Active gastrointestinal bleeding in the region of the splenic flexure of the colon with some reflux into the distal transverse colon and distal passage of tracer into the sigmoid colon. These results will be called to the ordering clinician or representative by the Radiologist Assistant, and communication documented in the PACS or zVision Dashboard. Electronically Signed   By: StClaudie Revering.D.   On: 11/20/2017 14:46   Nm Gi Blood Loss  Result Date: 11/18/2017 CLINICAL DATA:  GI bleed EXAM: NUCLEAR MEDICINE GASTROINTESTINAL BLEEDING SCAN TECHNIQUE: Sequential abdominal images were obtained following intravenous administration of Tc-9936mbeled red blood cells. RADIOPHARMACEUTICALS:  25 mCi Tc-12m44mtechnetate in-vitro labeled red cells. COMPARISON:  None. FINDINGS: Penile uptake. Excretory uptake in the bladder. No findings suspicious for active GI bleed. IMPRESSION: No findings suspicious for active GI bleed. Electronically Signed   By: SriyJulian Hy.   On: 11/18/2017 21:53   Us RK[place]al  Result Date: 11/09/2017 CLINICAL DATA:  Initial evaluation for urinary tract infection. EXAM: RENAL / URINARY TRACT ULTRASOUND COMPLETE COMPARISON:  None. FINDINGS: Right Kidney:  Length: 10.8 cm. Echogenicity grossly within normal limits. No mass lesion. No nephrolithiasis or hydronephrosis. Left Kidney: Length: 11.7 cm. Echogenicity grossly within normal limits. 4.1 x 4.4 x 4.8 cm exophytic cyst present at the lower pole of the left kidney. No associated vascularity. No hydronephrosis or nephrolithiasis. Bladder: Appears normal for degree of bladder distention. IMPRESSION: 1. No acute abnormality.  No hydronephrosis. 2. 4.8 cm exophytic left renal cyst. Electronically Signed   By: BenjJeannine Boga.   On: 11/09/2017 21:51   Dg Chest Port 1 View  Result Date: 11/15/2017 CLINICAL DATA:  Hypoxia. EXAM: PORTABLE CHEST 1 VIEW COMPARISON:  Multiple prior plain films including most recently 11/13/2017. CT 11/18/2013. FINDINGS: Mildly degraded  exam due to AP portable technique and patient body habitus. Chin overlies the apices. Midline trachea. Moderate cardiomegaly. Atherosclerosis in the transverse aorta. Right paratracheal soft tissue fullness is similar over multiple prior exams and likely related to right-sided thyroid enlargement. Probable layering bilateral pleural effusions. No pneumothorax. No congestive failure. Bibasilar airspace disease. IMPRESSION: Similar appearance of layering bilateral pleural effusions and bibasilar atelectasis or infection. Cardiomegaly without congestive failure. Aortic Atherosclerosis (ICD10-I70.0). Electronically Signed   By: Abigail Miyamoto [name]   On: 11/15/2017 14:04   Dg Chest Port 1 View  Result Date: 11/13/2017 CLINICAL DATA:  Bradycardia.  Former smoker. EXAM: PORTABLE CHEST 1 VIEW COMPARISON:  11/11/2017; 10/25/2017; 04/07/2017 FINDINGS: Grossly unchanged enlarged cardiac silhouette and mediastinal contours with atherosclerotic plaque within thoracic aorta. Grossly unchanged small bilateral effusions and associated bibasilar heterogeneous/consolidative opacities, left greater than right. No new focal airspace opacities. Re demonstrated  thinning the biapical pulmonary parenchyma however the pulmonary vasculature appears indistinct within mid and lower lungs. No definite pneumothorax, though note, evaluation degraded secondary to patient's overlying chin. No acute osseus abnormalities. IMPRESSION: Similar findings of cardiomegaly and pulmonary edema superimposed on advanced emphysematous change with small bilateral effusions and associated bibasilar opacities, left greater than right, atelectasis versus infiltrate. Electronically Signed   By: Sandi Mariscal [name]   On: 11/13/2017 15:59   Dg Chest Port 1 View  Result Date: 11/11/2017 CLINICAL DATA:  Congestive failure EXAM: PORTABLE CHEST 1 VIEW COMPARISON:  11/08/2017 FINDINGS: Cardiac shadow is enlarged but stable. Aortic calcifications are again seen. Fullness in the right paratracheal region is again noted and stable. This is related to vascularity and the right thyroid lobe as seen on prior CT examination from 2015. Small left pleural effusion is noted. No focal infiltrate is seen. IMPRESSION: Stable small pleural effusion on the left. No new focal abnormality is seen. Electronically Signed   By: Inez Catalina [name]   On: 11/11/2017 15:50   Dg Chest Portable 1 View  Result Date: 11/08/2017 CLINICAL DATA:  Patient with worsening lethargy. EXAM: PORTABLE CHEST 1 VIEW COMPARISON:  Chest radiograph 10/28/2017. FINDINGS: Limited rotated exam. Monitoring leads overlie the patient. Stable cardiomegaly. Similar right paratracheal density. Thoracic aortic vascular calcifications. Low lung volumes. Bibasilar heterogeneous opacities. Mild interstitial opacities bilaterally. No definite pleural effusion or pneumothorax. IMPRESSION: Cardiomegaly. Interstitial opacities bilaterally may represent mild edema. Basilar atelectasis. Electronically Signed   By: Lovey Newcomer [name]   On: 11/08/2017 16:22   Dg Chest Port 1 View  Result Date: 10/28/2017 CLINICAL DATA:  Monoclonal gammopathy of unknown significance.  History of CHF, COPD, atrial fibrillation, on home oxygen. EXAM: PORTABLE CHEST 1 VIEW COMPARISON:  Chest x-ray of Oct 25, 2017 FINDINGS: The lungs are well-expanded. There is stable soft tissue fullness in the right paratracheal region. The cardiac silhouette remains enlarged. The pulmonary vascularity is not engorged. There is calcification in the wall of the aortic arch. There degenerative changes of both shoulders. IMPRESSION: Resolution of pulmonary interstitial edema. Stable cardiac enlargement. No pulmonary vascular congestion. COPD. No acute pneumonia. Stable soft tissue fullness in the right paratracheal region which reflects prominent vascular structures on a CT scan in May of 2015. Thoracic aortic atherosclerosis. Electronically Signed   By: David  Martinique [name]   On: 10/28/2017 11:05   Dg Swallowing Func-speech Pathology  Result Date: 10/27/2017 Objective Swallowing Evaluation: Type of Study: MBS-Modified Barium Swallow Study  Patient Details Name: Richard Rosales MRN: [id number] Date of Birth: 1931-05-17 Today's Date: 10/27/2017 Time: SLP Start Time (  ACUTE ONLY): 1425 -SLP Stop Time (ACUTE ONLY): 1500 SLP Time Calculation (min) (ACUTE ONLY): 35 min Past Medical History: Past Medical History: Diagnosis Date . Acute bronchitis  . Adenomatous colon polyp  . Allergic rhinitis  . Anemia   iron deficient . Atrial fibrillation ([place])  . Atrial flutter ([place])  . Chronic gastritis  . COPD (chronic obstructive pulmonary disease) ([place])  . Diastolic dysfunction  . Diverticulosis of colon   recurrent GI bleeding . DJD (degenerative joint disease)  . GERD (gastroesophageal reflux disease)  . GI bleed  . HTN (hypertension)  . Hyperplastic colon polyp 2003 . Hypothyroidism  . Moderate aortic insufficiency  . OSA (obstructive sleep apnea)   compliants w/ CPAP . Positive PPD   remote . Shortness of breath  . Venous insufficiency  Past Surgical History: Past Surgical History: Procedure Laterality Date . CARDIAC CATHETERIZATION   7.27.05  revealed a preserved EF with no CAD . COLONOSCOPY   . HERNIA REPAIR Bilateral  . JOINT REPLACEMENT  2001  lt total knee-rt one done 00 . NASAL SINUS SURGERY   . POLYPECTOMY    adenomatous polyps 2010 . SHOULDER ARTHROSCOPY   . TOE SURGERY Right  . TONSILLECTOMY   . TOTAL KNEE ARTHROPLASTY    bilateral HPI: Richard Rosales is a 84 y.o. male with medical history significant of HTN, A. fib, COPD on 2 L of nasal cannula oxygen, CHF, hypothyroidism, MGUS, OSA on CPAP, and venous stasis; who presents with complaints of inability to ambulate.  At baseline patient uses a walker to ambulate.  2 days prior to admit pt have problems standing and fell back into a chair.   Pt unable to care for himself at home and his primary care provider advised him to come back into the hospital for further treatment.  He he has these chronic venous stasis ulcerations.  Patient also report to md having a 60 pound weight loss after changing his diet in the last 18 months, but for the last 6 months he reports that his weight is stable.  Other associated symptoms include increased shortness of breath and worsening tremor.  Patient denies any history of Parkinson's disease.  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  Family reports to RN pt coughs with intake - RN informed MD and swallow eval ordered.  Imaging study chest showed venous pulmonary congestion and possible pericardial effusion.  Pt found to have degenerative changes  C4-C5, C5-C6, C6-C7 on prior imaging study 2005.  SLP recommended to defer clinical eval and proceed with MBS.  Subjective: pt awake in chair Assessment / Plan / Recommendation CHL IP CLINICAL IMPRESSIONS 10/27/2017 Clinical Impression Patient presents with mild pharyngoesophageal phase dysphagia without aspiration and only trace penetration of sequential swallows of thin.  Decreased timing and adequacy of laryngeal closure resulted in mild penetration with  thin.  Pt observed to have retained secretions in pharynx that mixed with barium.  He was sensate to mild vallecular residuals and requested liquids to clear.   Decreased UES opening noted with resultant residuals  - ? prominent CP and mild backflow of liquids to distal pharynx without sensation..   Fortunately this was minimal and pt's lateral channels provided protection.    Suspect this backflow and pt's kyphosis (known h/o GERD) may contribute to pt's coughing over intake.  Of note, pt did NOT cough througout entire MBS - however after MBS, he did cough and expectorate viscous clear secretions.  Advised him to keep his cough/hock strong for airway protection.  Using live video, educated pt to findings/recommendations.  Will follow for dysphagia management.  SLP Visit Diagnosis Dysphagia, pharyngeal phase (R13.13);Dysphagia, pharyngoesophageal phase (R13.14) Attention and concentration deficit following -- Frontal lobe and executive function deficit following -- Impact on safety and function Moderate aspiration risk;Risk for inadequate nutrition/hydration   CHL IP TREATMENT RECOMMENDATION 10/27/2017 Treatment Recommendations Therapy as outlined in treatment plan below   Prognosis 10/27/2017 Prognosis for Safe Diet Advancement Good Barriers to Reach Goals -- Barriers/Prognosis Comment -- CHL IP DIET RECOMMENDATION 10/27/2017 SLP Diet Recommendations Regular solids;Thin liquid Liquid Administration via Straw Medication Administration Whole meds with liquid Compensations Minimize environmental distractions;Slow rate;Small sips/bites;Other (Comment) Postural Changes Remain semi-upright after after feeds/meals (Comment);Seated upright at 90 degrees   CHL IP OTHER RECOMMENDATIONS 10/27/2017 Recommended Consults -- Oral Care Recommendations Oral care BID Other Recommendations --   No flowsheet data found.  CHL IP FREQUENCY AND DURATION 10/27/2017 Speech Therapy Frequency (ACUTE ONLY) min 2x/week Treatment Duration 1 week      CHL  IP ORAL PHASE 10/27/2017 Oral Phase Impaired Oral - Pudding Teaspoon -- Oral - Pudding Cup -- Oral - Honey Teaspoon -- Oral - Honey Cup -- Oral - Nectar Teaspoon -- Oral - Nectar Cup WFL Oral - Nectar Straw -- Oral - Thin Teaspoon -- Oral - Thin Cup WFL Oral - Thin Straw WFL Oral - Puree WFL Oral - Mech Soft WFL Oral - Regular -- Oral - Multi-Consistency -- Oral - Pill WFL Oral Phase - Comment --  CHL IP PHARYNGEAL PHASE 10/27/2017 Pharyngeal Phase Impaired Pharyngeal- Pudding Teaspoon -- Pharyngeal -- Pharyngeal- Pudding Cup -- Pharyngeal -- Pharyngeal- Honey Teaspoon -- Pharyngeal -- Pharyngeal- Honey Cup -- Pharyngeal -- Pharyngeal- Nectar Teaspoon -- Pharyngeal -- Pharyngeal- Nectar Cup Reduced tongue base retraction;Pharyngeal residue - valleculae Pharyngeal -- Pharyngeal- Nectar Straw -- Pharyngeal -- Pharyngeal- Thin Teaspoon -- Pharyngeal -- Pharyngeal- Thin Cup Pharyngeal residue - valleculae;Reduced tongue base retraction;Penetration/Aspiration during swallow;Reduced laryngeal elevation Pharyngeal Material enters airway, remains ABOVE vocal cords and not ejected out Pharyngeal- Thin Straw Reduced tongue base retraction;Pharyngeal residue - valleculae;Reduced laryngeal elevation;Reduced airway/laryngeal closure Pharyngeal -- Pharyngeal- Puree WFL Pharyngeal -- Pharyngeal- Mechanical Soft -- Pharyngeal -- Pharyngeal- Regular WFL Pharyngeal -- Pharyngeal- Multi-consistency -- Pharyngeal -- Pharyngeal- Pill WFL Pharyngeal -- Pharyngeal Comment --  CHL IP CERVICAL ESOPHAGEAL PHASE 10/27/2017 Cervical Esophageal Phase Impaired Pudding Teaspoon -- Pudding Cup -- Honey Teaspoon -- Honey Cup -- Nectar Teaspoon -- Nectar Cup -- Nectar Straw -- Thin Teaspoon -- Thin Cup Reduced cricopharyngeal relaxation;Prominent cricopharyngeal segment Thin Straw Reduced cricopharyngeal relaxation;Prominent cricopharyngeal segment Puree Reduced cricopharyngeal relaxation;Prominent cricopharyngeal segment Mechanical Soft -- Regular  Reduced cricopharyngeal relaxation;Prominent cricopharyngeal segment Multi-consistency -- Pill Reduced cricopharyngeal relaxation;Prominent cricopharyngeal segment Cervical Esophageal Comment barium tablet taken with thin easily transited though pharynx and esophagus, pt did appear to have minimal stasis at mid-esophagus without sensation, this eventually cleared independently No flowsheet data found. Janett Labella [place], [place] [facility] [place] [phone]               Labs:  CBC: Recent Labs    11/19/17 0500 11/20/17 0151 11/20/17 0722 11/20/17 1044 11/20/17 1511  WBC 6.5 8.9 9.3  --  11.3*  HGB 9.1* 9.0* 7.8* 7.7* 7.5*  HCT 27.9* 27.3* 23.0* 23.5* 23.0*  PLT 188 234 235  --  280    COAGS: Recent Labs    11/19/17 0500  INR 1.15    BMP: Recent Labs  11/15/17 0426 11/16/17 0340 11/17/17 0609 11/18/17 0752  NA 131* 136 135 133*  K 4.2 4.5 4.4 4.5  CL 91* 94* 95* 95*  CO2 33* 35* 33* 28  GLUCOSE 89 96 92 89  BUN _0 CALCIUM 8.2* 8.6* 8.7* 8.2*  CREATININE 0.54* 0.75 0.73 0.77  GFRNONAA >60 >60 >60 >60  GFRAA >60 >60 >60 >60    LIVER FUNCTION TESTS: Recent Labs    08/12/17 1551 10/25/17 1253 11/15/17 0426 11/16/17 0340  BILITOT 0.7 1.2 0.8 0.7  AST _1 ALT _2 ALKPHOS  --  88 88 93  PROT 6.6 7.3 5.5* 5.3*  ALBUMIN  --  3.8 2.3* 2.3*    TUMOR MARKERS: No results for input(s): AFPTM, CEA, CA199, CHROMGRNA in the last 8760 hours.  Assessment and Plan: Hematochezia Active lower GI bleed, suspect descending colon NM RBC scan reviewed. IR will proceed with visceral arteriogram and possible embolization. Labs reviewed., pt with PRBCs ordered/available Normal renal function Risks and benefits of visceral angio and embolization were discussed with the patient including, but not limited to bleeding, infection, vascular injury or contrast induced renal failure. Also chance that bleeding source vessel is not visualized and therefore  embolization not done.  This interventional procedure involves the use of X-rays and because of the nature of the planned procedure, it is possible that we will have prolonged use of X-ray fluoroscopy.  Potential radiation risks to you include (but are not limited to) the following: - A slightly elevated risk for cancer  several years later in life. This risk is typically less than 0.5% percent. This risk is low in comparison to the normal incidence of human cancer, which is 33% for women and 50% for men according to the [employer]. - Radiation induced injury can include skin redness, resembling a rash, tissue breakdown / ulcers and hair loss (which can be temporary or permanent).   The likelihood of either of these occurring depends on the difficulty of the procedure and whether you are sensitive to radiation due to previous procedures, disease, or genetic conditions.   IF your procedure requires a prolonged use of radiation, you will be notified and given written instructions for further action.  It is your responsibility to monitor the irradiated area for the 2 weeks following the procedure and to notify your physician if you are concerned that you have suffered a radiation induced injury.    All of the patient's questions were answered, patient is agreeable to proceed.  Consent signed and in chart.   Thank you for this interesting consult.  I greatly enjoyed meeting Richard Rosales and look forward to participating in their care.  A copy of this report was sent to the requesting provider on this date.  Electronically Signed: Ascencion Dike, PA-C 11/20/2017, 3:58 PM   I spent a total of 30 minutes in face to face in clinical consultation, greater than 50% of which was counseling/coordinating care for GI bleed

## 2017-11-20 NOTE — Progress Notes (Signed)
GI PA Gribbin paged with updates per MD.

## 2017-11-20 NOTE — Progress Notes (Addendum)
Daily Rounding Note  11/20/2017, 8:12 AM  LOS: 7 days   SUBJECTIVE:   Chief complaint: hematochezia continues.  At least 4 overnight, 1 large episode of blood with clots this AM.   "Dark blood with clots" per RN note. No associated pain.  Pt denies weak and dizziness.  BPs softer: 90s/60s.  Heart rate stable in 60s to 80s (on metoprolol).    Hgb 9 >> 7.8 over 6 hours this AM.      OBJECTIVE:         Vital signs in last 24 hours:    Temp:  [97.3 F (36.3 C)-98.4 F (36.9 C)] 97.5 F (36.4 C) (05/31 0758) Pulse Rate:  [64-88] 88 (05/31 0758) Resp:  [12-20] 20 (05/31 0758) BP: (88-110)/(55-83) 90/63 (05/31 0758) SpO2:  [92 %-100 %] 96 % (05/31 0804) Weight:  [229 lb 11.5 oz (104.2 kg)] 229 lb 11.5 oz (104.2 kg) (05/31 0512) Last BM Date: 11/19/17 Filed Weights   11/18/17 0409 11/19/17 0529 11/20/17 0512  Weight: 234 lb 5.6 oz (106.3 kg) 227 lb 1 oz (103 kg) 229 lb 11.5 oz (104.2 kg)   General: alert, looks chronically ill   Heart: RRR Chest: clear bil.   Abdomen: obese, NT, soft, active BS  Extremities: no CCE Neuro/Psych:  Alert, oriented x 3.    Intake/Output from previous day: 05/30 0701 - 05/31 0700 In: 240 [P.O.:240] Out: 750 [Urine:750]  Intake/Output this shift: No intake/output data recorded.  Lab Results: Recent Labs    11/19/17 0500 11/20/17 0151 11/20/17 0722  WBC 6.5 8.9 9.3  HGB 9.1* 9.0* 7.8*  HCT 27.9* 27.3* 23.0*  PLT 188 234 235   BMET Recent Labs    11/18/17 0752  NA 133*  K 4.5  CL 95*  CO2 28  GLUCOSE 89  BUN 13  CREATININE 0.77  CALCIUM 8.2*   LFT No results for input(s): PROT, ALBUMIN, AST, ALT, ALKPHOS, BILITOT, BILIDIR, IBILI in the last 72 hours. PT/INR Recent Labs    11/19/17 0500  LABPROT 14.6  INR 1.15   Hepatitis Panel No results for input(s): HEPBSAG, HCVAB, HEPAIGM, HEPBIGM in the last 72 hours.  Studies/Results: Mr Cervical Spine Wo Contrast  Result  Date: 11/18/2017 CLINICAL DATA:  Cervical spinal stenosis. EXAM: MRI CERVICAL SPINE WITHOUT CONTRAST TECHNIQUE: Multiplanar, multisequence MR imaging of the cervical spine was performed. No intravenous contrast was administered. COMPARISON:  Radiography 09/18/2016.  Previous MRI 2005. FINDINGS: Patient would not tolerate axial imaging. Only sagittal sequences were obtained. The patient would not tolerate thoracic spine imaging as was ordered. Alignment: 6 mm anterolisthesis C3-4. Vertebrae: Distant fusion at C4-5. Cord: Some cord deformity at the C3-4 level. AP diameter of the canal only approximately 6 mm. Posterior Fossa, vertebral arteries, paraspinal tissues: Negative Disc levels: Foramen magnum sufficiently patent. Ordinary osteoarthritis at C1-2 without stenosis. C2-3 shows facet arthropathy on the left but no central canal stenosis. C3-4 shows central canal stenosis because of facet arthropathy and 6 mm of anterolisthesis. As noted above, the cord could be partially compressed at this level though there is no visible abnormal T2 signal. C4-5 shows distant fusion with sufficient patency of the canal and foramina. C5-6 shows spondylosis with osteophytes that narrow the ventral subarachnoid space but do not compress the cord. C6-7 shows small osteophytes but no compressive stenosis. C7-T1 is normal. Upper thoracic region is unremarkable. IMPRESSION: Spinal stenosis at C3-4. Advanced facet arthropathy with anterolisthesis of 6 mm. The  cord may be compressed or partially compressed at this level. See above discussion. Electronically Signed   By: Nelson Chimes [name]   On: 11/18/2017 14:00   Nm Gi Blood Loss  Result Date: 11/18/2017 CLINICAL DATA:  GI bleed EXAM: NUCLEAR MEDICINE GASTROINTESTINAL BLEEDING SCAN TECHNIQUE: Sequential abdominal images were obtained following intravenous administration of Tc-71m labeled red blood cells. RADIOPHARMACEUTICALS:  25 mCi Tc-21m pertechnetate in-vitro labeled red cells.  COMPARISON:  None. FINDINGS: Penile uptake. Excretory uptake in the bladder. No findings suspicious for active GI bleed. IMPRESSION: No findings suspicious for active GI bleed. Electronically Signed   By: Julian Hy [name]   On: 11/18/2017 21:53   Scheduled Meds: . aspirin EC  81 mg Oral Q M,W,F  . azelastine  1 spray Each Nare BID  . B-complex with vitamin C  1 tablet Oral Daily  . cholecalciferol  4,000 Units Oral Daily  . diltiazem  30 mg Oral Q8H  . docusate sodium  100 mg Oral BID  . furosemide  60 mg Oral QODAY  . levothyroxine  100 mcg Oral QAC breakfast  . metoprolol tartrate  12.5 mg Oral BID  . mometasone-formoterol  2 puff Inhalation BID  . multivitamin with minerals  1 tablet Oral Daily  . neomycin-bacitracin-polymyxin   Topical Daily  . pantoprazole  40 mg Oral Q0600  . potassium chloride  10 mEq Oral Daily  . sodium chloride flush  3 mL Intravenous Q12H  . tiotropium  1 capsule Inhalation Daily   Continuous Infusions: PRN Meds:.acetaminophen **OR** acetaminophen, albuterol, bisacodyl, calcium carbonate, diazepam, oxyCODONE, senna-docusate, sodium phosphate  ASSESMENT:   *   Painless hematochezia.  NM RBC blood loss scan 5/29 with no active bleeding.  Known diverticulosis per previous colonoscopies.  Presumed diverticular bleed.  *   ABL, Hgb drop of 1.2 gm over 6 hours.  No transfusions to date.    *   Cirrhosis of liver per chest CT 2015.  coags normal.    *   COPD, CHF.  Chronic hypoxia.     PLAN   *   Repeat nuc med bleeding scan this AM.  Need this to be positive before IR will pursue angiography/embolization.     *   Defer transfusion decisions to hospitalist.  Repeat CBC this afternoon 1500.     Azucena Freed  11/20/2017, 8:12 AM Phone [phone]  GI ATTENDING  Interval data and history reviewed. Agree with above progress note. Unfortunately he has had recurrent bleeding. Agree with plans for nuclear medicine scan and if positive proceed to  interventional radiology for possible embolization therapy for recurrent diverticular bleeding. Transfuse as needed  Docia Chuck. Geri Seminole., [name] [facility] Division of Gastroenterology

## 2017-11-20 NOTE — Progress Notes (Signed)
Patient just had another episode of passing out blood from rectum with clots, and bright red.MD Shahmehdi paged with updates, asked RN to page GI.

## 2017-11-20 NOTE — Progress Notes (Signed)
    Addendum: Repeat NM tagged RBC scan positive for site of bleeding was identified, interventional radiologist Dr. Laurence Ferrari has been called, he is to see the patient urgently for possible embolectomy  Patient has been typed and crossed for stat blood transfusion of 2 units.   Mikaele Stecher MD

## 2017-11-20 NOTE — Progress Notes (Addendum)
Patient with 4th bloody stool this morning. Patient remains asymptomatic, VS stable.Patient with no complains.  Paged NP X.Blount for another CBC order. Order placed.   Will continue to monitor.  Dearra Myhand, RN

## 2017-11-20 NOTE — Progress Notes (Signed)
Patient with another bleed tonight. Still with dark red blood and blood clots. Page Rhunette Croft. Blount and notified.  Awaiting on orders.  Cap Massi, RN

## 2017-11-20 NOTE — Progress Notes (Addendum)
Patient had 3rd bloody stool tonight, this time with dark blood and blood clots present again. Patient remains asymptomatic, alert and oriented with stable vitals signs. CBC will be done now instead 5 am, verbal order given per X.Blount. . Will continue to monitor.  Neftali Abair, RN

## 2017-11-20 NOTE — Progress Notes (Signed)
Hemoglobin 9.0. Patient denies pain, asymptomatic.  Bion Todorov, RN

## 2017-11-21 ENCOUNTER — Encounter (HOSPITAL_COMMUNITY): Payer: Self-pay | Admitting: Interventional Radiology

## 2017-11-21 DIAGNOSIS — R935 Abnormal findings on diagnostic imaging of other abdominal regions, including retroperitoneum: Secondary | ICD-10-CM

## 2017-11-21 LAB — CBC WITH DIFFERENTIAL/PLATELET
ABS IMMATURE GRANULOCYTES: 0 10*3/uL (ref 0.0–0.1)
BASOS ABS: 0 10*3/uL (ref 0.0–0.1)
Basophils Relative: 1 %
Eosinophils Absolute: 0.3 10*3/uL (ref 0.0–0.7)
Eosinophils Relative: 4 %
HCT: 24.1 % — ABNORMAL LOW (ref 39.0–52.0)
HEMOGLOBIN: 7.9 g/dL — AB (ref 13.0–17.0)
IMMATURE GRANULOCYTES: 1 %
LYMPHS PCT: 17 %
Lymphs Abs: 1.5 10*3/uL (ref 0.7–4.0)
MCH: 29.3 pg (ref 26.0–34.0)
MCHC: 32.8 g/dL (ref 30.0–36.0)
MCV: 89.3 fL (ref 78.0–100.0)
MONO ABS: 0.7 10*3/uL (ref 0.1–1.0)
MONOS PCT: 8 %
NEUTROS PCT: 71 %
Neutro Abs: 6.2 10*3/uL (ref 1.7–7.7)
Platelets: 245 10*3/uL (ref 150–400)
RBC: 2.7 MIL/uL — ABNORMAL LOW (ref 4.22–5.81)
RDW: 15.3 % (ref 11.5–15.5)
WBC: 8.7 10*3/uL (ref 4.0–10.5)

## 2017-11-21 LAB — CBC
HEMATOCRIT: 29.3 % — AB (ref 39.0–52.0)
HEMOGLOBIN: 9.8 g/dL — AB (ref 13.0–17.0)
MCH: 29 pg (ref 26.0–34.0)
MCHC: 33.4 g/dL (ref 30.0–36.0)
MCV: 86.7 fL (ref 78.0–100.0)
Platelets: 259 10*3/uL (ref 150–400)
RBC: 3.38 MIL/uL — AB (ref 4.22–5.81)
RDW: 15.9 % — ABNORMAL HIGH (ref 11.5–15.5)
WBC: 8.4 10*3/uL (ref 4.0–10.5)

## 2017-11-21 LAB — PREPARE RBC (CROSSMATCH)

## 2017-11-21 MED ORDER — FUROSEMIDE 10 MG/ML IJ SOLN: 40.0000 mg | Freq: Once | INTRAMUSCULAR | Status: DC

## 2017-11-21 MED ORDER — SODIUM CHLORIDE 0.9 % IV SOLN: INTRAVENOUS | Status: DC

## 2017-11-21 MED ORDER — SODIUM CHLORIDE 0.9 % IV SOLN
Freq: Once | INTRAVENOUS | Status: AC
  Administered 2017-11-21: 03:00:00 via INTRAVENOUS

## 2017-11-21 MED ORDER — SODIUM CHLORIDE 0.9 % IV SOLN
INTRAVENOUS | Status: DC
  Administered 2017-11-21: 02:00:00 via INTRAVENOUS

## 2017-11-21 MED ORDER — FUROSEMIDE 10 MG/ML IJ SOLN
20.0000 mg | Freq: Once | INTRAMUSCULAR | Status: AC
  Administered 2017-11-21: 20 mg via INTRAVENOUS
  Filled 2017-11-21: qty 2

## 2017-11-21 MED ORDER — ORAL CARE MOUTH RINSE
15.0000 mL | Freq: Two times a day (BID) | OROMUCOSAL | Status: DC
  Administered 2017-11-21 – 2017-11-24 (×7): 15 mL via OROMUCOSAL

## 2017-11-21 NOTE — Progress Notes (Signed)
Received order to transfer pt to step down unit for profuse bleeding with clots. At the moment, no bed is available in ICU. Will continue to monitor.

## 2017-11-21 NOTE — Progress Notes (Signed)
Pt is bleeding out heavily, MD has been paged Via Am[employer]. Waiting for response

## 2017-11-21 NOTE — Progress Notes (Signed)
Pt is alert and follow commands. Currently bleeding out  with clots. Still waiting for on call to address

## 2017-11-21 NOTE — Progress Notes (Signed)
GI ATTENDING  HISTORY OF PRESENT ILLNESS:  Richard Rosales is a 84 y.o. male who we have been following for acute diverticular bleeding. Bleeding had stopped, only to return vigorously. Tagged red blood cell scan was positive yesterday for bleeding in the region of the splenic flexure. Subsequent mesenteric angiogram was negative for active bleeding, thus no embolization. Has had no further bleeding since. Has received 4 units of packed red blood cells. Last hemoglobin 7.9. Follow-up hemoglobin pending. He has no complaints at this time. Has been moved to the MICU. Multiple family members in room. Hungry  REVIEW OF SYSTEMS:  All non-GI ROS negative except for back pain  Past Medical History:  Diagnosis Date  . Acute bronchitis   . Adenomatous colon polyp   . Allergic rhinitis   . Anemia    iron deficient  . Atrial fibrillation ([place])   . Atrial flutter ([place])   . Chronic gastritis   . COPD (chronic obstructive pulmonary disease) ([place])   . Diastolic dysfunction   . Diverticulosis of colon    recurrent GI bleeding  . DJD (degenerative joint disease)   . GERD (gastroesophageal reflux disease)   . GI bleed   . HTN (hypertension)   . Hyperplastic colon polyp 2003  . Hypothyroidism   . Moderate aortic insufficiency   . OSA (obstructive sleep apnea)    compliants w/ CPAP  . Positive PPD    remote  . Shortness of breath   . Venous insufficiency     Past Surgical History:  Procedure Laterality Date  . CARDIAC CATHETERIZATION  7.27.05   revealed a preserved EF with no CAD  . COLONOSCOPY    . HERNIA REPAIR Bilateral   . IR ANGIOGRAM VISCERAL SELECTIVE  11/20/2017  . IR ANGIOGRAM VISCERAL SELECTIVE  11/20/2017  . IR US GUIDE VASC ACCESS RIGHT  11/20/2017  . JOINT REPLACEMENT  2001   lt total knee-rt one done 00  . NASAL SINUS SURGERY    . POLYPECTOMY     adenomatous polyps 2010  . SHOULDER ARTHROSCOPY    . TOE SURGERY Right   . TONSILLECTOMY    . TOTAL KNEE ARTHROPLASTY     bilateral    Social History Richard Rosales  reports that he quit smoking about 39 years ago. His smoking use included cigarettes. He has a 20.00 pack-year smoking history. He has never used smokeless tobacco. He reports that he drinks alcohol. He reports that he does not use drugs.  family history includes Arrhythmia (age of onset: 46) in his sister; Diabetes in his father; Heart failure in his father; Hypertension in his mother; Lung cancer in his brother.  Allergies  Allergen Reactions  . Other Other (See Comments)    No blood thinners; they caused him to [place]  . Ace Inhibitors Cough  . Beta Adrenergic Blockers Other (See Comments)    Bradycardia   . Decadron [Dexamethasone] Other (See Comments)    Becomes very "mean" if taking this  . Fluticasone-Salmeterol Other (See Comments)    Doesn't remember the reaction  . Hytrin [Terazosin] Other (See Comments)    Nasal congestion   . Prednisone     Shakes / tremors  . Vioxx [Rofecoxib] Other (See Comments)    Dyspepsia        PHYSICAL EXAMINATION: Vital signs: BP 105/83 (BP Location: Left Arm)   Pulse 83   Temp (!) 95.8 F (35.4 C) (Oral)   Resp 19   Ht 5\' 9"  (  1.753 m)   Wt 224 lb 6.9 oz (101.8 kg)   SpO2 99%   BMI 33.14 kg/m   Constitutional: generally well-appearing, no acute distress Psychiatric: alert and oriented x3, cooperative Eyes: extraocular movements intact, anicteric, conjunctiva pink Mouth: oral pharynx moist, no lesions Neck: supple no lymphadenopathy Cardiovascular: heart regular rate and rhythm, no murmur Lungs: clear to auscultation bilaterally Abdomen: soft,obese, nontender, nondistended, no obvious ascites, no peritoneal signs, normal bowel sounds, no organomegaly Rectal:omitted Extremities: no clubbing or cyanosis. Trace lower extremity edema bilaterally Skin: no lesions on visible extremities Neuro: No focal deficits. No asterixis.    ASSESSMENT:  #1. Acute diverticular bleed.  He has stopped spontaneously for the time being. Recent tagged red blood cell scan and angiogram as described #2. Acute blood loss anemia #3. Multiple general medical problems and advanced age   PLAN:  #1. Continue to monitor stools and blood counts #2. Transfuse as clinically indicated #3. If brisk bleeding resumes, straight to angiogram with IR attention to the region of the splenic flexure #4. GERD with diet We'll follow. All family questions answered  Docia Chuck. Geri Seminole., [name] [facility] Division of Gastroenterology

## 2017-11-21 NOTE — Progress Notes (Signed)
Patient Demographics:    Richard Rosales, is a 84 y.o. male, DOB - [birth year], YIR:[id number]  Admit date - 11/13/2017   Admitting Physician Vianne Bulls, MD  Outpatient Primary MD for the patient is Unk Pinto, MD  LOS - 8   Chief Complaint  Patient presents with  . Bradycardia        Subjective:    Richard Rosales today has no fevers, no emesis,  No chest pain, daughter at bedside, questions answered, patient is hungry, no further BM since 1 AM, no obvious bleeding at this time  Assessment  & Plan :    Principal Problem:   Syncope Active Problems:   OSA and COPD overlap syndrome (HCC)   Essential hypertension   COPD (chronic obstructive pulmonary disease) (HCC)   Hypothyroidism   A-fib (HCC)   Acute on chronic systolic CHF (congestive heart failure) (HCC)   Elevated troponin   UTI (urinary tract infection)   Pressure injury of skin   Orthostatic hypotension   GI bleed   Acute blood loss anemia   Abnormal x-ray of abdomen  Brief summary 84 y.o. male  past medical history relevant for A. fib, not on anticoagulation, COPD with chronic hypoxic respiratory failure on home O2, systolic dysfunction CHF and history of recurrent GI bleed admitted on 11/13/2017 from [facility] facility with syncope .  He was found to be orthostatic but presumably from hypovolemia induced by diuresis.    History of recurrent/intermittent diverticular bleed, Tagged red blood cell scan was positive on 11/20/2017 for bleeding in the region of the splenic flexure. Subsequent mesenteric angiogram on 11/20/2017 was negative for active bleeding, thus no embolization. Has had no further bleeding since. Has received 4 units of packed red blood cells, hemoglobin is up to 9.8 from 7.5   Plan:- 1) recurrent GI/rectal bleeding--presumed diverticular origin, Tagged red blood cell scan was positive on 11/20/2017 for bleeding in  the region of the splenic flexure. Subsequent mesenteric angiogram on 11/20/2017 was negative for active bleeding, thus no embolization. Has had no further bleeding since. Has received 4 units of packed red blood cells, hemoglobin is up to 9.8 from 7.5.  No further BMs, as of 11/21/2017 p.m. may start clear liquid diet.  So far patient has had a total of 4 units of packed cells [patient had 2 units of packed cells on 11/20/2017 and another 2 units of packed cells on 11/21/2017)  2) syncope with orthostatic hypotension/ hypovolemia--secondary to #1 above  3)HFrEF--patient with history of chronic systolic dysfunction CHF, last known EF 40 to 45% , currently does not appear to be volume overloaded, use diuretics with transfusion to avoid volume overload.  Continue metoprolol 12.5 mg twice daily as BP allows  4) generalized weakness/debility--chronic anemia as a contributing factor, CHF is also contributory, patient also has  spinal stenosis and peripheral neuropathy and lower extremity weakness  5) chronic hypoxic respiratory failure secondary to underlying COPD--continue oxygen supplementation, continue bronchodilators  6) chronic Afib-declines anticoagulation due to recurrent bleed, continue metoprolol 12.5 mg twice daily for rate control as BP allows, patient is aspirating on Mondays Wednesdays and Fridays  7)Full thickness wound to R great toe:  outpatient follow up with ortho.  Seen by wound care  8)Social/Ethics--discussed with patient and daughter at bedside, patient is a DNR, patient interested in completing the MOST form, palliative care consult requested for goals of care and to have patient and family complete the MOST form   Code Status : DNR   Disposition Plan  : SNF  Consults  :  Gi/IR  DVT Prophylaxis  :    SCDs   Lab Results  Component Value Date   PLT 259 11/21/2017    Inpatient Medications  Scheduled Meds: . aspirin EC  81 mg Oral Q M,W,F  . azelastine  1 spray Each Nare  BID  . B-complex with vitamin C  1 tablet Oral Daily  . cholecalciferol  4,000 Units Oral Daily  . docusate sodium  100 mg Oral BID  . furosemide  20 mg Intravenous Once  . levothyroxine  100 mcg Oral QAC breakfast  . mouth rinse  15 mL Mouth Rinse BID  . metoprolol tartrate  12.5 mg Oral BID  . mometasone-formoterol  2 puff Inhalation BID  . multivitamin with minerals  1 tablet Oral Daily  . neomycin-bacitracin-polymyxin   Topical Daily  . pantoprazole  40 mg Oral Q0600  . potassium chloride  10 mEq Oral Daily  . sodium chloride flush  3 mL Intravenous Q12H  . tiotropium  1 capsule Inhalation Daily   Continuous Infusions: . sodium chloride Stopped (11/21/17 1113)   PRN Meds:.acetaminophen **OR** acetaminophen, albuterol, bisacodyl, calcium carbonate, diazepam, oxyCODONE, senna-docusate, sodium phosphate, technetium labeled red blood cells    Anti-infectives (From admission, onward)   Start     Dose/Rate Route Frequency Ordered Stop   11/14/17 0030  cefdinir ([name]) capsule 300 mg    Note to Pharmacy:  Patient taking differently: Take 300 mg by mouth two times a day for 3 days     300 mg Oral Every 12 hours 11/13/17 2038 11/16/17 0959        Objective:   Vitals:   11/21/17 1300 11/21/17 1400 11/21/17 1500 11/21/17 1545  BP: (!) 91/58 (!) 101/52 95/66   Pulse: 95 94 94   Resp: (!) 28 (!) 23 (!) 25   Temp:    (!) 97.4 F (36.3 C)  TempSrc:    Oral  SpO2: 100% 100% 100%   Weight:      Height:        Wt Readings from Last 3 Encounters:  11/21/17 101.8 kg (224 lb 6.9 oz)  11/12/17 106.4 kg (234 lb 9.1 oz)  10/31/17 103 kg (227 lb 1.2 oz)     Intake/Output Summary (Last 24 hours) at 11/21/2017 1625 Last data filed at 11/21/2017 1500 Gross per 24 hour  Intake 1526.33 ml  Output -  Net 1526.33 ml     Physical Exam  Gen:- Awake Alert, in no acute distress HEENT:- [place].AT, No sclera icterus Neck-Supple Neck,No JVD,.  Lungs-  CTAB , fair air movement CV- S1, S2  normal, irregularly irregular Abd-  +ve B.Sounds, Abd Soft, No tenderness,    Extremity/Skin:-Trace edema, good pulses Psych-affect is appropriate, oriented x3 Neuro-no new focal deficits, no tremors   Data Review:   Micro Results Recent Results (from the past 240 hour(s))  Urine culture     Status: Abnormal   Collection Time: 11/13/17  3:30 PM  Result Value Ref Range Status   Specimen Description URINE, RANDOM  Final   Special Requests   Final    NONE Performed at [facility], [place] [address], [place], [place] [address]  Culture MULTIPLE SPECIES PRESENT, SUGGEST RECOLLECTION (A)  Final   Report Status 11/14/2017 FINAL  Final    Radiology Reports Dg Chest 2 View  Result Date: 10/25/2017 CLINICAL DATA:  84 year old male with history of weakness. EXAM: CHEST - 2 VIEW COMPARISON:  Chest x-ray 04/07/2017. FINDINGS: In volumes are low. No consolidative airspace disease. No pleural effusions. Cephalization of the pulmonary vasculature, without frank pulmonary edema. Enlargement of the cardiopericardial silhouette, which could reflect underlying cardiomegaly and/or the presence of a pericardial effusion. Upper mediastinal contours are within normal limits. Aortic atherosclerosis. IMPRESSION: 1. Enlarged cardiopericardial silhouette, which could reflect cardiomegaly and/or presence of pericardial effusion. If, further evaluation there is clinical concern for pericardial effusion with echocardiography is suggested. 2. Pulmonary venous congestion, without frank pulmonary edema. 3. Aortic atherosclerosis. Electronically Signed   By: Vinnie Langton [name]   On: 10/25/2017 13:17   Ct Head Wo Contrast  Result Date: 10/26/2017 CLINICAL DATA:  Leg weakness for 4 days. EXAM: CT HEAD WITHOUT CONTRAST TECHNIQUE: Contiguous axial images were obtained from the base of the skull through the vertex without intravenous contrast. COMPARISON:  11/11/2013 FINDINGS: Brain: Chronic moderate small vessel  ischemic disease of periventricular white matter. Age appropriate involutional changes of the brain. No hydrocephalus. No effacement of the basal cisterns. Cerebellum and brainstem are stable and intact. No intra-axial mass nor extra-axial fluid collections. No large vascular territory infarct. Chronic small lacunar infarct involving the anterior limb of the left internal capsule as before. Vascular: No hyperdense vessel or unexpected calcification. Skull: Negative for fracture or focal lesion. Sinuses/Orbits: Status post bilateral maxillary antrectomies. Minimal ethmoid and sphenoid sinus mucosal thickening. Intact orbits and globes. Other: None IMPRESSION: 1. Chronic microvascular ischemic disease and old left lacunar infarct of the anterior limb of the internal capsule. No acute intracranial abnormality. 2. Status post bilateral maxillary antrectomy. Electronically Signed   By: Ashley Royalty [name]   On: 10/26/2017 19:53   Mr Brain Wo Contrast  Result Date: 11/15/2017 CLINICAL DATA:  Syncope, simple, abnormal neuro exam. Recent hospital admission for lethargy and urinary tract infection. Syncopal episode at skilled nursing facility. EXAM: MRI HEAD WITHOUT CONTRAST TECHNIQUE: Multiplanar, multiecho pulse sequences of the brain and surrounding structures were obtained without intravenous contrast. COMPARISON:  CT head without contrast 10/26/2017 FINDINGS: Brain: Mild generalized atrophy is present. Moderate diffuse periventricular and subcortical T2 hyperintensities are present bilaterally. Ventricles are proportionate to the degree of atrophy. No significant extra-axial fluid collection is present. The internal auditory canals are within normal limits bilaterally. The brainstem and cerebellum are normal. Vascular: Flow is present in the major intracranial arteries. Skull and upper cervical spine: Skull base is within normal limits. Exaggerated cervical lordosis is present. There is focal central canal stenosis at  C3-4 with grade 1 anterolisthesis. Midline sagittal structures are otherwise unremarkable. Sinuses/Orbits: Bilateral maxillary antrostomies are present. No significant residual recurrent sinus disease is present. Small polyps are present within the sphenoid sinuses bilaterally. The mastoid air cells are clear. Globes and orbits are within normal limits. IMPRESSION: 1. No acute intracranial abnormality. 2. Atrophy and white matter changes are moderately advanced for age. The finding is nonspecific but can be seen in the setting of chronic microvascular ischemia, a demyelinating process such as multiple sclerosis, vasculitis, complicated migraine headaches, or as the sequelae of a prior infectious or inflammatory process. 3. Moderate central canal stenosis at C3-4. 4. Previous sinus surgery. Electronically Signed   By: San Morelle [name]   On: 11/15/2017 11:19  Mr Cervical Spine Wo Contrast  Result Date: 11/18/2017 CLINICAL DATA:  Cervical spinal stenosis. EXAM: MRI CERVICAL SPINE WITHOUT CONTRAST TECHNIQUE: Multiplanar, multisequence MR imaging of the cervical spine was performed. No intravenous contrast was administered. COMPARISON:  Radiography 09/18/2016.  Previous MRI 2005. FINDINGS: Patient would not tolerate axial imaging. Only sagittal sequences were obtained. The patient would not tolerate thoracic spine imaging as was ordered. Alignment: 6 mm anterolisthesis C3-4. Vertebrae: Distant fusion at C4-5. Cord: Some cord deformity at the C3-4 level. AP diameter of the canal only approximately 6 mm. Posterior Fossa, vertebral arteries, paraspinal tissues: Negative Disc levels: Foramen magnum sufficiently patent. Ordinary osteoarthritis at C1-2 without stenosis. C2-3 shows facet arthropathy on the left but no central canal stenosis. C3-4 shows central canal stenosis because of facet arthropathy and 6 mm of anterolisthesis. As noted above, the cord could be partially compressed at this level though there  is no visible abnormal T2 signal. C4-5 shows distant fusion with sufficient patency of the canal and foramina. C5-6 shows spondylosis with osteophytes that narrow the ventral subarachnoid space but do not compress the cord. C6-7 shows small osteophytes but no compressive stenosis. C7-T1 is normal. Upper thoracic region is unremarkable. IMPRESSION: Spinal stenosis at C3-4. Advanced facet arthropathy with anterolisthesis of 6 mm. The cord may be compressed or partially compressed at this level. See above discussion. Electronically Signed   By: Nelson Chimes [name]   On: 11/18/2017 14:00   Mr Lumbar Spine Wo Contrast  Result Date: 10/26/2017 CLINICAL DATA:  Extreme low back pain. EXAM: MRI LUMBAR SPINE WITHOUT CONTRAST TECHNIQUE: Multiplanar, multisequence MR imaging of the lumbar spine was performed. No intravenous contrast was administered. COMPARISON:  MRI lumbar spine 08/05/2010. FINDINGS: There is extreme motion degradation. Study is marginally diagnostic. Segmentation: Standard. Alignment: Exaggerated lumbar lordosis. 2 mm anterolisthesis L5-S1. 2 mm compensatory retrolisthesis L3-4 L2-3 and trace L1-2. Vertebrae: No worrisome osseous lesion. Endplate reactive changes related to disc space narrowing. Conus medullaris and cauda equina: Conus extends to the L1. level. Conus and cauda equina appear normal. Paraspinal and other soft tissues: Renal cystic disease, poorly visualized. Disc levels: L1-L2: Disc space narrowing. Central protrusion. Facet arthropathy. No definite impingement. L2-L3: 2 mm retrolisthesis. Central protrusion. Posterior element hypertrophy. Moderate stenosis. BILATERAL L2 and L3 nerve root impingement likely. L3-L4: Disc space narrowing. Central protrusion. Posterior element hypertrophy. Moderate to severe stenosis. BILATERAL L3 and L4 nerve root impingement. L4-L5: Disc space narrowing. Central protrusion. Posterior element hypertrophy. Severe stenosis. BILATERAL L4 and L5 nerve root  impingement. L5-S1: 2 mm anterolisthesis. Annular bulge. Posterior element hypertrophy. No definite impingement. IMPRESSION: Marginally diagnostic study demonstrating moderate to severe stenosis throughout the L2-3 through L4-5 disc spaces related to subluxations, disc space narrowing, disc protrusions, and posterior element hypertrophy. Symptomatic neural impingement could occur at any or all of those levels. See discussion above. Suspected progression since previous MR from 2012. Electronically Signed   By: Staci Righter [name]   On: 10/26/2017 20:45   Nm Gi Blood Loss  Result Date: 11/20/2017 CLINICAL DATA:  Gastrointestinal bleeding. EXAM: NUCLEAR MEDICINE GASTROINTESTINAL BLEEDING SCAN TECHNIQUE: Sequential abdominal images were obtained following intravenous administration of Tc-47m labeled red blood cells. RADIOPHARMACEUTICALS:  25 mCi Tc-76m pertechnetate in-vitro labeled red cells. COMPARISON:  11/18/2017. FINDINGS: Active gastrointestinal bleeding in the left upper quadrant of the abdomen with tracer extending through tortuous bowel loops in the left abdomen to the pelvis. This is following the expected course of the colon with some reflux into the distal transverse  colon. IMPRESSION: Active gastrointestinal bleeding in the region of the splenic flexure of the colon with some reflux into the distal transverse colon and distal passage of tracer into the sigmoid colon. These results will be called to the ordering clinician or representative by the Radiologist Assistant, and communication documented in the PACS or zVision Dashboard. Electronically Signed   By: Claudie Revering [name]   On: 11/20/2017 14:46   Nm Gi Blood Loss  Result Date: 11/18/2017 CLINICAL DATA:  GI bleed EXAM: NUCLEAR MEDICINE GASTROINTESTINAL BLEEDING SCAN TECHNIQUE: Sequential abdominal images were obtained following intravenous administration of Tc-14m labeled red blood cells. RADIOPHARMACEUTICALS:  25 mCi Tc-33m pertechnetate in-vitro  labeled red cells. COMPARISON:  None. FINDINGS: Penile uptake. Excretory uptake in the bladder. No findings suspicious for active GI bleed. IMPRESSION: No findings suspicious for active GI bleed. Electronically Signed   By: Julian Hy [name]   On: 11/18/2017 21:53   Ir Angiogram Visceral Selective  Result Date: 11/21/2017 INDICATION: 84 year old male with a history of diverticulosis and acute lower GI bleeding. Tagged nuclear medicine red blood cell study suggests active bleeding in the region of the splenic flexure. He presents for urgent visceral selective angiography and embolization if the bleeding source can be identified. EXAM: IR ULTRASOUND GUIDANCE VASC ACCESS RIGHT; SELECTIVE VISCERAL ARTERIOGRAPHY MEDICATIONS: None ANESTHESIA/SEDATION: Moderate (conscious) sedation was employed during this procedure. A total of Versed 1.5 mg and Fentanyl 150 mcg was administered intravenously. Moderate Sedation Time: 40 minutes. The patient's level of consciousness and vital signs were monitored continuously by radiology nursing throughout the procedure under my direct supervision. CONTRAST:  105 cc Isovue 300 FLUOROSCOPY TIME:  Fluoroscopy Time: 19 minutes 54 seconds (516 mGy). COMPLICATIONS: None immediate. PROCEDURE: Informed consent was obtained from the patient following explanation of the procedure, risks, benefits and alternatives. The patient understands, agrees and consents for the procedure. All questions were addressed. A time out was performed prior to the initiation of the procedure. Maximal barrier sterile technique utilized including caps, mask, sterile gowns, sterile gloves, large sterile drape, hand hygiene, and Betadine prep. The right common femoral artery was interrogated with ultrasound and found to be widely patent. An image was obtained and stored for the medical record. Local anesthesia was attained by infiltration with 1% lidocaine. A small dermatotomy was made. Under real-time sonographic  guidance, the vessel was punctured with a 21 gauge micropuncture needle. Using standard technique, the initial micro needle was exchanged over a 0.018 micro wire for a transitional 4 [place] micro sheath. The micro sheath was then exchanged over a 0.035 wire for a 5 French vascular sheath. A RIM catheter was advanced over a Bentson wire into the abdominal aorta. Attempts were made to select the inferior mesenteric artery. 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 Therefore, the rim catheter was exchanged over a Bentson wire for a SOS catheter which was successfully used to engage the origin of the inferior mesenteric artery. An inferior mesenteric arteriogram was performed. No evidence of active bleeding. Good visualization of the left colon. The SOS catheter was then removed and replaced with a C2 cobra catheter. The Cobra catheter was used to select the celiac artery. A celiac arteriogram was performed. No evidence of upper GI bleeding. No evidence of replaced middle colic artery. The cobra catheter was then used to select the superior mesenteric artery. A superior mesenteric arteriogram was performed. No evidence of acute arterial bleeding. At this time,  the procedure was terminated. The patient was relatively confused and having difficulty staying still. Hemostasis was attained with the assistance of an Angio-Seal extra arterial vascular plug. IMPRESSION: 1. Negative three-vessel visceral arteriogram. No evidence of active lower GI bleeding at this time. 2. Mild fusiform aneurysmal dilatation of the infrarenal abdominal aorta with a maximal diameter of 3 cm. Recommend followup by ultrasound in 3 years. This recommendation follows ACR consensus guidelines: White Paper of the ACR Incidental Findings Committee II on Vascular Findings. Joellyn Rued Radiol 2013; [id number] Signed, Criselda Peaches, MD [facility] Radiology Electronically Signed   By: Jacqulynn Cadet [name]   On: 11/21/2017 10:49   Ir Angiogram Visceral Selective  Result Date: 11/21/2017 INDICATION: 84 year old male with a history of diverticulosis and acute lower GI bleeding. Tagged nuclear medicine red blood cell study suggests active bleeding in the region of the splenic flexure. He presents for urgent visceral selective angiography and embolization if the bleeding source can be identified. EXAM: IR ULTRASOUND GUIDANCE VASC ACCESS RIGHT; SELECTIVE VISCERAL ARTERIOGRAPHY MEDICATIONS: None ANESTHESIA/SEDATION: Moderate (conscious) sedation was employed during this procedure. A total of Versed 1.5 mg and Fentanyl 150 mcg was administered intravenously. Moderate Sedation Time: 40 minutes. The patient's level of consciousness and vital signs were monitored continuously by radiology nursing throughout the procedure under my direct supervision. CONTRAST:  105 cc Isovue 300 FLUOROSCOPY TIME:  Fluoroscopy Time: 19 minutes 54 seconds (516 mGy). COMPLICATIONS: None immediate. PROCEDURE: Informed consent was obtained from the patient following explanation of the procedure, risks, benefits and alternatives. The patient understands, agrees and consents for the procedure. All questions were addressed. A time out was performed prior to the initiation of the procedure. Maximal barrier sterile technique utilized including caps, mask, sterile gowns, sterile gloves, large sterile drape, hand hygiene, and Betadine prep. The right common femoral artery was interrogated with ultrasound and found to be widely patent. An image was obtained and stored for the medical record. Local anesthesia was attained by infiltration with 1% lidocaine. A small dermatotomy was made. Under real-time sonographic guidance, the vessel was punctured with a 21 gauge micropuncture needle. Using standard technique, the initial micro needle was exchanged over a 0.018  micro wire for a transitional 4 [place] micro sheath. The micro sheath was then exchanged over a 0.035 wire for a 5 French vascular sheath. A RIM catheter was advanced over a Bentson wire into the abdominal aorta. Attempts were made to select the inferior mesenteric artery. 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 Therefore, the rim catheter was exchanged over a Bentson wire for a SOS catheter which was successfully used to engage the origin of the inferior mesenteric artery. An inferior mesenteric arteriogram was performed. No evidence of active bleeding. Good visualization of the left colon. The SOS catheter was then removed and replaced with a C2 cobra catheter. The Cobra catheter was used to select the celiac artery. A celiac arteriogram was performed. No evidence of upper GI bleeding. No evidence of replaced middle colic artery. The cobra catheter was then used to select the superior mesenteric artery. A superior mesenteric arteriogram was performed. No evidence of acute arterial bleeding. At this time, the procedure was terminated. The patient was relatively confused and having difficulty staying still. Hemostasis was attained with the assistance of an Angio-Seal extra arterial vascular plug. IMPRESSION: 1. Negative three-vessel visceral arteriogram. No evidence of active lower GI  bleeding at this time. 2. Mild fusiform aneurysmal dilatation of the infrarenal abdominal aorta with a maximal diameter of 3 cm. Recommend followup by ultrasound in 3 years. This recommendation follows ACR consensus guidelines: White Paper of the ACR Incidental Findings Committee II on Vascular Findings. Joellyn Rued Radiol 2013; [id number] Signed, Criselda Peaches, MD [facility] Radiology Electronically Signed   By: Jacqulynn Cadet [name]   On: 11/21/2017 10:49   US Renal  Result Date:  11/09/2017 CLINICAL DATA:  Initial evaluation for urinary tract infection. EXAM: RENAL / URINARY TRACT ULTRASOUND COMPLETE COMPARISON:  None. FINDINGS: Right Kidney: Length: 10.8 cm. Echogenicity grossly within normal limits. No mass lesion. No nephrolithiasis or hydronephrosis. Left Kidney: Length: 11.7 cm. Echogenicity grossly within normal limits. 4.1 x 4.4 x 4.8 cm exophytic cyst present at the lower pole of the left kidney. No associated vascularity. No hydronephrosis or nephrolithiasis. Bladder: Appears normal for degree of bladder distention. IMPRESSION: 1. No acute abnormality.  No hydronephrosis. 2. 4.8 cm exophytic left renal cyst. Electronically Signed   By: Jeannine Boga [name]   On: 11/09/2017 21:51   Ir US Guide Vasc Access Right  Result Date: 11/21/2017 INDICATION: 84 year old male with a history of diverticulosis and acute lower GI bleeding. Tagged nuclear medicine red blood cell study suggests active bleeding in the region of the splenic flexure. He presents for urgent visceral selective angiography and embolization if the bleeding source can be identified. EXAM: IR ULTRASOUND GUIDANCE VASC ACCESS RIGHT; SELECTIVE VISCERAL ARTERIOGRAPHY MEDICATIONS: None ANESTHESIA/SEDATION: Moderate (conscious) sedation was employed during this procedure. A total of Versed 1.5 mg and Fentanyl 150 mcg was administered intravenously. Moderate Sedation Time: 40 minutes. The patient's level of consciousness and vital signs were monitored continuously by radiology nursing throughout the procedure under my direct supervision. CONTRAST:  105 cc Isovue 300 FLUOROSCOPY TIME:  Fluoroscopy Time: 19 minutes 54 seconds (516 mGy). COMPLICATIONS: None immediate. PROCEDURE: Informed consent was obtained from the patient following explanation of the procedure, risks, benefits and alternatives. The patient understands, agrees and consents for the procedure. All questions were addressed. A time out was performed prior to the  initiation of the procedure. Maximal barrier sterile technique utilized including caps, mask, sterile gowns, sterile gloves, large sterile drape, hand hygiene, and Betadine prep. The right common femoral artery was interrogated with ultrasound and found to be widely patent. An image was obtained and stored for the medical record. Local anesthesia was attained by infiltration with 1% lidocaine. A small dermatotomy was made. Under real-time sonographic guidance, the vessel was punctured with a 21 gauge micropuncture needle. Using standard technique, the initial micro needle was exchanged over a 0.018 micro wire for a transitional 4 [place] micro sheath. The micro sheath was then exchanged over a 0.035 wire for a 5 French vascular sheath. A RIM catheter was advanced over a Bentson wire into the abdominal aorta. Attempts were made to select the inferior mesenteric artery. 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 Therefore, the rim catheter was exchanged over a Bentson wire for a SOS catheter which was successfully used to engage the origin of the inferior mesenteric artery. An inferior mesenteric arteriogram was performed. No evidence of active bleeding. Good visualization of the left colon. The SOS catheter was then removed and replaced with a C2 cobra catheter. The Cobra catheter was used to select the celiac artery. A celiac arteriogram was performed.  No evidence of upper GI bleeding. No evidence of replaced middle colic artery. The cobra catheter was then used to select the superior mesenteric artery. A superior mesenteric arteriogram was performed. No evidence of acute arterial bleeding. At this time, the procedure was terminated. The patient was relatively confused and having difficulty staying still. Hemostasis was attained with the assistance of an Angio-Seal extra arterial vascular plug. IMPRESSION: 1. Negative  three-vessel visceral arteriogram. No evidence of active lower GI bleeding at this time. 2. Mild fusiform aneurysmal dilatation of the infrarenal abdominal aorta with a maximal diameter of 3 cm. Recommend followup by ultrasound in 3 years. This recommendation follows ACR consensus guidelines: White Paper of the ACR Incidental Findings Committee II on Vascular Findings. Joellyn Rued Radiol 2013; [phone]:[id number] Signed, Criselda Peaches, MD [facility] Radiology Electronically Signed   By: Jacqulynn Cadet [name]   On: 11/21/2017 10:49   Dg Chest Port 1 View  Result Date: 11/15/2017 CLINICAL DATA:  Hypoxia. EXAM: PORTABLE CHEST 1 VIEW COMPARISON:  Multiple prior plain films including most recently 11/13/2017. CT 11/18/2013. FINDINGS: Mildly degraded exam due to AP portable technique and patient body habitus. Chin overlies the apices. Midline trachea. Moderate cardiomegaly. Atherosclerosis in the transverse aorta. Right paratracheal soft tissue fullness is similar over multiple prior exams and likely related to right-sided thyroid enlargement. Probable layering bilateral pleural effusions. No pneumothorax. No congestive failure. Bibasilar airspace disease. IMPRESSION: Similar appearance of layering bilateral pleural effusions and bibasilar atelectasis or infection. Cardiomegaly without congestive failure. Aortic Atherosclerosis (ICD10-I70.0). Electronically Signed   By: Abigail Miyamoto [name]   On: 11/15/2017 14:04   Dg Chest Port 1 View  Result Date: 11/13/2017 CLINICAL DATA:  Bradycardia.  Former smoker. EXAM: PORTABLE CHEST 1 VIEW COMPARISON:  11/11/2017; 10/25/2017; 04/07/2017 FINDINGS: Grossly unchanged enlarged cardiac silhouette and mediastinal contours with atherosclerotic plaque within thoracic aorta. Grossly unchanged small bilateral effusions and associated bibasilar heterogeneous/consolidative opacities, left greater than right. No new focal airspace opacities.  Re demonstrated thinning the biapical pulmonary parenchyma however the pulmonary vasculature appears indistinct within mid and lower lungs. No definite pneumothorax, though note, evaluation degraded secondary to patient's overlying chin. No acute osseus abnormalities. IMPRESSION: Similar findings of cardiomegaly and pulmonary edema superimposed on advanced emphysematous change with small bilateral effusions and associated bibasilar opacities, left greater than right, atelectasis versus infiltrate. Electronically Signed   By: Sandi Mariscal [name]   On: 11/13/2017 15:59   Dg Chest Port 1 View  Result Date: 11/11/2017 CLINICAL DATA:  Congestive failure EXAM: PORTABLE CHEST 1 VIEW COMPARISON:  11/08/2017 FINDINGS: Cardiac shadow is enlarged but stable. Aortic calcifications are again seen. Fullness in the right paratracheal region is again noted and stable. This is related to vascularity and the right thyroid lobe as seen on prior CT examination from 2015. Small left pleural effusion is noted. No focal infiltrate is seen. IMPRESSION: Stable small pleural effusion on the left. No new focal abnormality is seen. Electronically Signed   By: Inez Catalina [name]   On: 11/11/2017 15:50   Dg Chest Portable 1 View  Result Date: 11/08/2017 CLINICAL DATA:  Patient with worsening lethargy. EXAM: PORTABLE CHEST 1 VIEW COMPARISON:  Chest radiograph 10/28/2017. FINDINGS: Limited rotated exam. Monitoring leads overlie the patient. Stable cardiomegaly. Similar right paratracheal density. Thoracic aortic vascular calcifications. Low lung volumes. Bibasilar heterogeneous opacities. Mild interstitial opacities bilaterally. No definite pleural effusion or pneumothorax. IMPRESSION: Cardiomegaly. Interstitial opacities bilaterally may represent mild edema. Basilar atelectasis. Electronically Signed  By: Lovey Newcomer [name]   On: 11/08/2017 16:22   Dg Chest Port 1 View  Result Date: 10/28/2017 CLINICAL DATA:  Monoclonal gammopathy of  unknown significance. History of CHF, COPD, atrial fibrillation, on home oxygen. EXAM: PORTABLE CHEST 1 VIEW COMPARISON:  Chest x-ray of Oct 25, 2017 FINDINGS: The lungs are well-expanded. There is stable soft tissue fullness in the right paratracheal region. The cardiac silhouette remains enlarged. The pulmonary vascularity is not engorged. There is calcification in the wall of the aortic arch. There degenerative changes of both shoulders. IMPRESSION: Resolution of pulmonary interstitial edema. Stable cardiac enlargement. No pulmonary vascular congestion. COPD. No acute pneumonia. Stable soft tissue fullness in the right paratracheal region which reflects prominent vascular structures on a CT scan in May of 2015. Thoracic aortic atherosclerosis. Electronically Signed   By: David  Martinique [name]   On: 10/28/2017 11:05   Dg Swallowing Func-speech Pathology  Result Date: 10/27/2017 Objective Swallowing Evaluation: Type of Study: MBS-Modified Barium Swallow Study  Patient Details Name: GERSHON SHORTEN MRN: [id number] Date of Birth: 1930/07/27 Today's Date: 10/27/2017 Time: SLP Start Time (ACUTE ONLY): 1425 -SLP Stop Time (ACUTE ONLY): 1500 SLP Time Calculation (min) (ACUTE ONLY): 35 min Past Medical History: Past Medical History: Diagnosis Date . Acute bronchitis  . Adenomatous colon polyp  . Allergic rhinitis  . Anemia   iron deficient . Atrial fibrillation ([place])  . Atrial flutter ([place])  . Chronic gastritis  . COPD (chronic obstructive pulmonary disease) ([place])  . Diastolic dysfunction  . Diverticulosis of colon   recurrent GI bleeding . DJD (degenerative joint disease)  . GERD (gastroesophageal reflux disease)  . GI bleed  . HTN (hypertension)  . Hyperplastic colon polyp 2003 . Hypothyroidism  . Moderate aortic insufficiency  . OSA (obstructive sleep apnea)   compliants w/ CPAP . Positive PPD   remote . Shortness of breath  . Venous insufficiency  Past Surgical History: Past Surgical History: Procedure Laterality Date .  CARDIAC CATHETERIZATION  7.27.05  revealed a preserved EF with no CAD . COLONOSCOPY   . HERNIA REPAIR Bilateral  . JOINT REPLACEMENT  2001  lt total knee-rt one done 00 . NASAL SINUS SURGERY   . POLYPECTOMY    adenomatous polyps 2010 . SHOULDER ARTHROSCOPY   . TOE SURGERY Right  . TONSILLECTOMY   . TOTAL KNEE ARTHROPLASTY    bilateral HPI: RAYCE BRAHMBHATT is a 84 y.o. male with medical history significant of HTN, A. fib, COPD on 2 L of nasal cannula oxygen, CHF, hypothyroidism, MGUS, OSA on CPAP, and venous stasis; who presents with complaints of inability to ambulate.  At baseline patient uses a walker to ambulate.  2 days prior to admit pt have problems standing and fell back into a chair.   Pt unable to care for himself at home and his primary care provider advised him to come back into the hospital for further treatment.  He he has these chronic venous stasis ulcerations.  Patient also report to md having a 60 pound weight loss after changing his diet in the last 18 months, but for the last 6 months he reports that his weight is stable.  Other associated symptoms include increased shortness of breath and worsening tremor.  Patient denies any history of Parkinson's disease.  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  Family reports to RN pt coughs with intake - RN informed MD and swallow eval ordered.  Imaging study  chest showed venous pulmonary congestion and possible pericardial effusion.  Pt found to have degenerative changes  C4-C5, C5-C6, C6-C7 on prior imaging study 2005.  SLP recommended to defer clinical eval and proceed with MBS.  Subjective: pt awake in chair Assessment / Plan / Recommendation CHL IP CLINICAL IMPRESSIONS 10/27/2017 Clinical Impression Patient presents with mild pharyngoesophageal phase dysphagia without aspiration and only trace penetration of sequential swallows of thin.  Decreased timing and adequacy of laryngeal closure resulted  in mild penetration with thin.  Pt observed to have retained secretions in pharynx that mixed with barium.  He was sensate to mild vallecular residuals and requested liquids to clear.   Decreased UES opening noted with resultant residuals  - ? prominent CP and mild backflow of liquids to distal pharynx without sensation..   Fortunately this was minimal and pt's lateral channels provided protection.    Suspect this backflow and pt's kyphosis (known h/o GERD) may contribute to pt's coughing over intake.  Of note, pt did NOT cough througout entire MBS - however after MBS, he did cough and expectorate viscous clear secretions.  Advised him to keep his cough/hock strong for airway protection.  Using live video, educated pt to findings/recommendations.  Will follow for dysphagia management.  SLP Visit Diagnosis Dysphagia, pharyngeal phase (R13.13);Dysphagia, pharyngoesophageal phase (R13.14) Attention and concentration deficit following -- Frontal lobe and executive function deficit following -- Impact on safety and function Moderate aspiration risk;Risk for inadequate nutrition/hydration   CHL IP TREATMENT RECOMMENDATION 10/27/2017 Treatment Recommendations Therapy as outlined in treatment plan below   Prognosis 10/27/2017 Prognosis for Safe Diet Advancement Good Barriers to Reach Goals -- Barriers/Prognosis Comment -- CHL IP DIET RECOMMENDATION 10/27/2017 SLP Diet Recommendations Regular solids;Thin liquid Liquid Administration via Straw Medication Administration Whole meds with liquid Compensations Minimize environmental distractions;Slow rate;Small sips/bites;Other (Comment) Postural Changes Remain semi-upright after after feeds/meals (Comment);Seated upright at 90 degrees   CHL IP OTHER RECOMMENDATIONS 10/27/2017 Recommended Consults -- Oral Care Recommendations Oral care BID Other Recommendations --   No flowsheet data found.  CHL IP FREQUENCY AND DURATION 10/27/2017 Speech Therapy Frequency (ACUTE ONLY) min 2x/week Treatment  Duration 1 week      CHL IP ORAL PHASE 10/27/2017 Oral Phase Impaired Oral - Pudding Teaspoon -- Oral - Pudding Cup -- Oral - Honey Teaspoon -- Oral - Honey Cup -- Oral - Nectar Teaspoon -- Oral - Nectar Cup WFL Oral - Nectar Straw -- Oral - Thin Teaspoon -- Oral - Thin Cup WFL Oral - Thin Straw WFL Oral - Puree WFL Oral - Mech Soft WFL Oral - Regular -- Oral - Multi-Consistency -- Oral - Pill WFL Oral Phase - Comment --  CHL IP PHARYNGEAL PHASE 10/27/2017 Pharyngeal Phase Impaired Pharyngeal- Pudding Teaspoon -- Pharyngeal -- Pharyngeal- Pudding Cup -- Pharyngeal -- Pharyngeal- Honey Teaspoon -- Pharyngeal -- Pharyngeal- Honey Cup -- Pharyngeal -- Pharyngeal- Nectar Teaspoon -- Pharyngeal -- Pharyngeal- Nectar Cup Reduced tongue base retraction;Pharyngeal residue - valleculae Pharyngeal -- Pharyngeal- Nectar Straw -- Pharyngeal -- Pharyngeal- Thin Teaspoon -- Pharyngeal -- Pharyngeal- Thin Cup Pharyngeal residue - valleculae;Reduced tongue base retraction;Penetration/Aspiration during swallow;Reduced laryngeal elevation Pharyngeal Material enters airway, remains ABOVE vocal cords and not ejected out Pharyngeal- Thin Straw Reduced tongue base retraction;Pharyngeal residue - valleculae;Reduced laryngeal elevation;Reduced airway/laryngeal closure Pharyngeal -- Pharyngeal- Puree WFL Pharyngeal -- Pharyngeal- Mechanical Soft -- Pharyngeal -- Pharyngeal- Regular WFL Pharyngeal -- Pharyngeal- Multi-consistency -- Pharyngeal -- Pharyngeal- Pill WFL Pharyngeal -- Pharyngeal Comment --  CHL IP CERVICAL ESOPHAGEAL PHASE 10/27/2017 Cervical  Esophageal Phase Impaired Pudding Teaspoon -- Pudding Cup -- Honey Teaspoon -- Honey Cup -- Nectar Teaspoon -- Nectar Cup -- Nectar Straw -- Thin Teaspoon -- Thin Cup Reduced cricopharyngeal relaxation;Prominent cricopharyngeal segment Thin Straw Reduced cricopharyngeal relaxation;Prominent cricopharyngeal segment Puree Reduced cricopharyngeal relaxation;Prominent cricopharyngeal segment  Mechanical Soft -- Regular Reduced cricopharyngeal relaxation;Prominent cricopharyngeal segment Multi-consistency -- Pill Reduced cricopharyngeal relaxation;Prominent cricopharyngeal segment Cervical Esophageal Comment barium tablet taken with thin easily transited though pharynx and esophagus, pt did appear to have minimal stasis at mid-esophagus without sensation, this eventually cleared independently No flowsheet data found. Janett Labella [place], [facility] [phone]                CBC Recent Labs  Lab 11/20/17 [id number] 11/20/17 7096 11/20/17 1044 11/20/17 1511 11/21/17 0243 11/21/17 1452  WBC 8.9 9.3  --  11.3* 8.7 8.4  HGB 9.0* 7.8* 7.7* 7.5* 7.9* 9.8*  HCT 27.3* 23.0* 23.5* 23.0* 24.1* 29.3*  PLT 234 235  --  280 245 259  MCV 91.6 90.6  --  92.4 89.3 86.7  MCH 30.2 30.7  --  30.1 29.3 29.0  MCHC 33.0 33.9  --  32.6 32.8 33.4  RDW 13.5 13.3  --  13.8 15.3 15.9*  LYMPHSABS  --  2.0  --   --  1.5  --   MONOABS  --  0.8  --   --  0.7  --   EOSABS  --  0.5  --   --  0.3  --   BASOSABS  --  0.0  --   --  0.0  --     Chemistries  Recent Labs  Lab 11/15/17 0426 11/16/17 0340 11/17/17 0609 11/18/17 0752  NA 131* 136 135 133*  K 4.2 4.5 4.4 4.5  CL 91* 94* 95* 95*  CO2 33* 35* 33* 28  GLUCOSE 89 96 92 89  BUN 8 8 8 13   CREATININE 0.54* 0.75 0.73 0.77  CALCIUM 8.2* 8.6* 8.7* 8.2*  MG 2.1 2.3 2.1 1.9  AST 26 28  --   --   ALT 24 23  --   --   ALKPHOS 88 93  --   --   BILITOT 0.8 0.7  --   --    ------------------------------------------------------------------------------------------------------------------ No results for input(s): CHOL, HDL, LDLCALC, TRIG, CHOLHDL, LDLDIRECT in the last 72 hours.  Lab Results  Component Value Date   HGBA1C 5.6 08/12/2017   ------------------------------------------------------------------------------------------------------------------ No results for input(s): TSH, T4TOTAL, T3FREE, THYROIDAB in the last 72 hours.  Invalid  input(s): FREET3 ------------------------------------------------------------------------------------------------------------------ No results for input(s): VITAMINB12, FOLATE, FERRITIN, TIBC, IRON, RETICCTPCT in the last 72 hours.  Coagulation profile Recent Labs  Lab 11/19/17 0500  INR 1.15    No results for input(s): DDIMER in the last 72 hours.  Cardiac Enzymes No results for input(s): CKMB, TROPONINI, MYOGLOBIN in the last 168 hours.  Invalid input(s): CK ------------------------------------------------------------------------------------------------------------------    Component Value Date/Time   BNP 698.4 (H) 11/14/2017 0245   BNP 106.8 (H) 11/17/2013 1213     Roxan Hockey M.D on 11/21/2017 at 4:25 PM  Between 7am to 7pm - Pager - [phone]  After 7pm go to [email or web address] - password [email or web address]  [facility] -  Office  [phone]   Voice Recognition Viviann Spare dictation system was used to create this note, attempts have been made to correct errors. Please contact the author with questions and/or clarifications.

## 2017-11-21 NOTE — Progress Notes (Signed)
Pt was moved to 38M ICU at shift change. Pt was A/O x4. Report given at bedside to the assigned nurse. Pt's wife has been notified of the transfer.

## 2017-11-21 NOTE — Progress Notes (Signed)
On call provider wants Hgb to be drawn before transfusing the next units of blood. Waiting for lab to draw blood

## 2017-11-22 LAB — CBC
HEMATOCRIT: 25.4 % — AB (ref 39.0–52.0)
HEMOGLOBIN: 8.4 g/dL — AB (ref 13.0–17.0)
MCH: 29.3 pg (ref 26.0–34.0)
MCHC: 33.1 g/dL (ref 30.0–36.0)
MCV: 88.5 fL (ref 78.0–100.0)
Platelets: 279 10*3/uL (ref 150–400)
RBC: 2.87 MIL/uL — ABNORMAL LOW (ref 4.22–5.81)
RDW: 16 % — AB (ref 11.5–15.5)
WBC: 7.6 10*3/uL (ref 4.0–10.5)

## 2017-11-22 LAB — BASIC METABOLIC PANEL
ANION GAP: 10 (ref 5–15)
ANION GAP: 8 (ref 5–15)
BUN: 28 mg/dL — ABNORMAL HIGH (ref 6–20)
BUN: 28 mg/dL — ABNORMAL HIGH (ref 6–20)
CALCIUM: 7.8 mg/dL — AB (ref 8.9–10.3)
CHLORIDE: 100 mmol/L — AB (ref 101–111)
CHLORIDE: 98 mmol/L — AB (ref 101–111)
CO2: 24 mmol/L (ref 22–32)
CO2: 28 mmol/L (ref 22–32)
Calcium: 7.7 mg/dL — ABNORMAL LOW (ref 8.9–10.3)
Creatinine, Ser: 0.86 mg/dL (ref 0.61–1.24)
Creatinine, Ser: 0.98 mg/dL (ref 0.61–1.24)
GFR calc Af Amer: >60 mL/min (ref >60)
GFR calc non Af Amer: >60 mL/min (ref >60)
Glucose, Bld: 99 mg/dL (ref 65–99)
Glucose, Bld: 138 mg/dL — ABNORMAL HIGH (ref 65–99)
POTASSIUM: 4.2 mmol/L (ref 3.5–5.1)
POTASSIUM: 3.5 mmol/L (ref 3.5–5.1)
SODIUM: 134 mmol/L — AB (ref 135–145)
Sodium: 134 mmol/L — ABNORMAL LOW (ref 135–145)

## 2017-11-22 LAB — PREPARE RBC (CROSSMATCH)

## 2017-11-22 LAB — HEMOGLOBIN AND HEMATOCRIT, BLOOD
HCT: 23.6 % — ABNORMAL LOW (ref 39.0–52.0)
HEMOGLOBIN: 8 g/dL — AB (ref 13.0–17.0)

## 2017-11-22 LAB — GLUCOSE, CAPILLARY: Glucose-Capillary: 99 mg/dL (ref 65–99)

## 2017-11-22 MED ORDER — SODIUM CHLORIDE 0.9 % IV SOLN
Freq: Once | INTRAVENOUS | Status: AC
  Administered 2017-11-22: 22:00:00 via INTRAVENOUS

## 2017-11-22 MED ORDER — FUROSEMIDE 10 MG/ML IJ SOLN
20.0000 mg | Freq: Once | INTRAMUSCULAR | Status: AC
  Administered 2017-11-23: 20 mg via INTRAVENOUS
  Filled 2017-11-22: qty 2

## 2017-11-22 NOTE — Progress Notes (Addendum)
Patient ID: Richard Rosales, male   DOB: Mar 31, 1931, 84 y.o.   MRN: [id number]     Progress Note   Subjective   Talkative, family at bedside - no c/o abdominal pain- had small BM this am apparently  Hard-tinge of blood Talking about not wanting to have MRI done - hgb 8.4  this am ( s/p 4 units total this admit)  Objective   Vital signs in last 24 hours: Temp:  [97.4 F (36.3 C)-98.4 F (36.9 C)] 98.4 F (36.9 C) (06/02 1159) Pulse Rate:  [73-111] 111 (06/02 0901) Resp:  [17-25] 17 (06/02 0900) BP: (89-127)/(52-104) 95/65 (06/02 0901) SpO2:  [93 %-100 %] 95 % (06/02 0900) Last BM Date: 11/22/17 General:    Elderly WM  in NAD Heart:  irRegular rate and rhythm; no murmurs Lungs: Respirations even and unlabored, lungs CTA bilaterally Abdomen:  Soft, nontender and nondistended. Normal bowel sounds. Extremities:  Without edema. Neurologic:  Alert and oriented,  grossly normal neurologically. Psych:  Cooperative. Normal mood and affect.  Intake/Output from previous day: 06/01 0701 - 06/02 0700 In: 3785 [P.O.:1078; I.V.:3; Blood:315] Out: 750 [Urine:750] Intake/Output this shift: Total I/O In: 800 [P.O.:800] Out: -   Lab Results: Recent Labs    11/21/17 0243 11/21/17 1452 11/22/17 0927  WBC 8.7 8.4 7.6  HGB 7.9* 9.8* 8.4*  HCT 24.1* 29.3* 25.4*  PLT 245 259 279   BMET Recent Labs    11/22/17 0627 11/22/17 0927  NA 134* 134*  K 4.2 3.5  CL 100* 98*  CO2 24 28  GLUCOSE 99 138*  BUN 28* 28*  CREATININE 0.86 0.98  CALCIUM 7.7* 7.8*   LFT No results for input(s): PROT, ALBUMIN, AST, ALT, ALKPHOS, BILITOT, BILIDIR, IBILI in the last 72 hours. PT/INR No results for input(s): LABPROT, INR in the last 72 hours.  Studies/Results: Nm Gi Blood Loss  Result Date: 11/20/2017 CLINICAL DATA:  Gastrointestinal bleeding. EXAM: NUCLEAR MEDICINE GASTROINTESTINAL BLEEDING SCAN TECHNIQUE: Sequential abdominal images were obtained following intravenous administration of Tc-73m  labeled red blood cells. RADIOPHARMACEUTICALS:  25 mCi Tc-34m pertechnetate in-vitro labeled red cells. COMPARISON:  11/18/2017. FINDINGS: Active gastrointestinal bleeding in the left upper quadrant of the abdomen with tracer extending through tortuous bowel loops in the left abdomen to the pelvis. This is following the expected course of the colon with some reflux into the distal transverse colon. IMPRESSION: Active gastrointestinal bleeding in the region of the splenic flexure of the colon with some reflux into the distal transverse colon and distal passage of tracer into the sigmoid colon. These results will be called to the ordering clinician or representative by the Radiologist Assistant, and communication documented in the PACS or zVision Dashboard. Electronically Signed   By: Claudie Revering [name]   On: 11/20/2017 14:46   Ir Angiogram Visceral Selective  Result Date: 11/21/2017 INDICATION: 84 year old male with a history of diverticulosis and acute lower GI bleeding. Tagged nuclear medicine red blood cell study suggests active bleeding in the region of the splenic flexure. He presents for urgent visceral selective angiography and embolization if the bleeding source can be identified. EXAM: IR ULTRASOUND GUIDANCE VASC ACCESS RIGHT; SELECTIVE VISCERAL ARTERIOGRAPHY MEDICATIONS: None ANESTHESIA/SEDATION: Moderate (conscious) sedation was employed during this procedure. A total of Versed 1.5 mg and Fentanyl 150 mcg was administered intravenously. Moderate Sedation Time: 40 minutes. The patient's level of consciousness and vital signs were monitored continuously by radiology nursing throughout the procedure under my direct supervision. CONTRAST:  105 cc Isovue 300  FLUOROSCOPY TIME:  Fluoroscopy Time: 19 minutes 54 seconds (516 mGy). COMPLICATIONS: None immediate. PROCEDURE: Informed consent was obtained from the patient following explanation of the procedure, risks, benefits and alternatives. The patient  understands, agrees and consents for the procedure. All questions were addressed. A time out was performed prior to the initiation of the procedure. Maximal barrier sterile technique utilized including caps, mask, sterile gowns, sterile gloves, large sterile drape, hand hygiene, and Betadine prep. The right common femoral artery was interrogated with ultrasound and found to be widely patent. An image was obtained and stored for the medical record. Local anesthesia was attained by infiltration with 1% lidocaine. A small dermatotomy was made. Under real-time sonographic guidance, the vessel was punctured with a 21 gauge micropuncture needle. Using standard technique, the initial micro needle was exchanged over a 0.018 micro wire for a transitional 4 [place] micro sheath. The micro sheath was then exchanged over a 0.035 wire for a 5 French vascular sheath. A RIM catheter was advanced over a Bentson wire into the abdominal aorta. Attempts were made to select the inferior mesenteric artery. 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 Therefore, the rim catheter was exchanged over a Bentson wire for a SOS catheter which was successfully used to engage the origin of the inferior mesenteric artery. An inferior mesenteric arteriogram was performed. No evidence of active bleeding. Good visualization of the left colon. The SOS catheter was then removed and replaced with a C2 cobra catheter. The Cobra catheter was used to select the celiac artery. A celiac arteriogram was performed. No evidence of upper GI bleeding. No evidence of replaced middle colic artery. The cobra catheter was then used to select the superior mesenteric artery. A superior mesenteric arteriogram was performed. No evidence of acute arterial bleeding. At this time, the procedure was terminated. The patient was relatively confused and having difficulty staying still.  Hemostasis was attained with the assistance of an Angio-Seal extra arterial vascular plug. IMPRESSION: 1. Negative three-vessel visceral arteriogram. No evidence of active lower GI bleeding at this time. 2. Mild fusiform aneurysmal dilatation of the infrarenal abdominal aorta with a maximal diameter of 3 cm. Recommend followup by ultrasound in 3 years. This recommendation follows ACR consensus guidelines: White Paper of the ACR Incidental Findings Committee II on Vascular Findings. Joellyn Rued Radiol 2013; 49:201-007 Signed, Criselda Peaches, MD [facility] Radiology Electronically Signed   By: Jacqulynn Cadet [name]   On: 11/21/2017 10:49   Ir Angiogram Visceral Selective  Result Date: 11/21/2017 INDICATION: 84 year old male with a history of diverticulosis and acute lower GI bleeding. Tagged nuclear medicine red blood cell study suggests active bleeding in the region of the splenic flexure. He presents for urgent visceral selective angiography and embolization if the bleeding source can be identified. EXAM: IR ULTRASOUND GUIDANCE VASC ACCESS RIGHT; SELECTIVE VISCERAL ARTERIOGRAPHY MEDICATIONS: None ANESTHESIA/SEDATION: Moderate (conscious) sedation was employed during this procedure. A total of Versed 1.5 mg and Fentanyl 150 mcg was administered intravenously. Moderate Sedation Time: 40 minutes. The patient's level of consciousness and vital signs were monitored continuously by radiology nursing throughout the procedure under my direct supervision. CONTRAST:  105 cc Isovue 300 FLUOROSCOPY TIME:  Fluoroscopy Time: 19 minutes 54 seconds (516 mGy). COMPLICATIONS: None immediate. PROCEDURE: Informed consent was obtained from the patient following explanation of the procedure, risks, benefits and alternatives. The patient understands, agrees and consents for the  procedure. All questions were addressed. A time out was performed prior to the initiation of the  procedure. Maximal barrier sterile technique utilized including caps, mask, sterile gowns, sterile gloves, large sterile drape, hand hygiene, and Betadine prep. The right common femoral artery was interrogated with ultrasound and found to be widely patent. An image was obtained and stored for the medical record. Local anesthesia was attained by infiltration with 1% lidocaine. A small dermatotomy was made. Under real-time sonographic guidance, the vessel was punctured with a 21 gauge micropuncture needle. Using standard technique, the initial micro needle was exchanged over a 0.018 micro wire for a transitional 4 [place] micro sheath. The micro sheath was then exchanged over a 0.035 wire for a 5 French vascular sheath. A RIM catheter was advanced over a Bentson wire into the abdominal aorta. Attempts were made to select the inferior mesenteric artery. 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 Therefore, the rim catheter was exchanged over a Bentson wire for a SOS catheter which was successfully used to engage the origin of the inferior mesenteric artery. An inferior mesenteric arteriogram was performed. No evidence of active bleeding. Good visualization of the left colon. The SOS catheter was then removed and replaced with a C2 cobra catheter. The Cobra catheter was used to select the celiac artery. A celiac arteriogram was performed. No evidence of upper GI bleeding. No evidence of replaced middle colic artery. The cobra catheter was then used to select the superior mesenteric artery. A superior mesenteric arteriogram was performed. No evidence of acute arterial bleeding. At this time, the procedure was terminated. The patient was relatively confused and having difficulty staying still. Hemostasis was attained with the assistance of an Angio-Seal extra arterial vascular plug. IMPRESSION: 1. Negative three-vessel visceral  arteriogram. No evidence of active lower GI bleeding at this time. 2. Mild fusiform aneurysmal dilatation of the infrarenal abdominal aorta with a maximal diameter of 3 cm. Recommend followup by ultrasound in 3 years. This recommendation follows ACR consensus guidelines: White Paper of the ACR Incidental Findings Committee II on Vascular Findings. Joellyn Rued Radiol 2013; 16:109-604 Signed, Criselda Peaches, MD [facility] Radiology Electronically Signed   By: Jacqulynn Cadet [name]   On: 11/21/2017 10:49   Ir US Guide Vasc Access Right  Result Date: 11/21/2017 INDICATION: 84 year old male with a history of diverticulosis and acute lower GI bleeding. Tagged nuclear medicine red blood cell study suggests active bleeding in the region of the splenic flexure. He presents for urgent visceral selective angiography and embolization if the bleeding source can be identified. EXAM: IR ULTRASOUND GUIDANCE VASC ACCESS RIGHT; SELECTIVE VISCERAL ARTERIOGRAPHY MEDICATIONS: None ANESTHESIA/SEDATION: Moderate (conscious) sedation was employed during this procedure. A total of Versed 1.5 mg and Fentanyl 150 mcg was administered intravenously. Moderate Sedation Time: 40 minutes. The patient's level of consciousness and vital signs were monitored continuously by radiology nursing throughout the procedure under my direct supervision. CONTRAST:  105 cc Isovue 300 FLUOROSCOPY TIME:  Fluoroscopy Time: 19 minutes 54 seconds (516 mGy). COMPLICATIONS: None immediate. PROCEDURE: Informed consent was obtained from the patient following explanation of the procedure, risks, benefits and alternatives. The patient understands, agrees and consents for the procedure. All questions were addressed. A time out was performed prior to the initiation of the procedure. Maximal barrier sterile technique utilized including caps, mask, sterile gowns, sterile gloves, large sterile drape, hand hygiene, and  Betadine  prep. The right common femoral artery was interrogated with ultrasound and found to be widely patent. An image was obtained and stored for the medical record. Local anesthesia was attained by infiltration with 1% lidocaine. A small dermatotomy was made. Under real-time sonographic guidance, the vessel was punctured with a 21 gauge micropuncture needle. Using standard technique, the initial micro needle was exchanged over a 0.018 micro wire for a transitional 4 [place] micro sheath. The micro sheath was then exchanged over a 0.035 wire for a 5 French vascular sheath. A RIM catheter was advanced over a Bentson wire into the abdominal aorta. Attempts were made to select the inferior mesenteric artery. 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 Therefore, the rim catheter was exchanged over a Bentson wire for a SOS catheter which was successfully used to engage the origin of the inferior mesenteric artery. An inferior mesenteric arteriogram was performed. No evidence of active bleeding. Good visualization of the left colon. The SOS catheter was then removed and replaced with a C2 cobra catheter. The Cobra catheter was used to select the celiac artery. A celiac arteriogram was performed. No evidence of upper GI bleeding. No evidence of replaced middle colic artery. The cobra catheter was then used to select the superior mesenteric artery. A superior mesenteric arteriogram was performed. No evidence of acute arterial bleeding. At this time, the procedure was terminated. The patient was relatively confused and having difficulty staying still. Hemostasis was attained with the assistance of an Angio-Seal extra arterial vascular plug. IMPRESSION: 1. Negative three-vessel visceral arteriogram. No evidence of active lower GI bleeding at this time. 2. Mild fusiform aneurysmal dilatation of the infrarenal abdominal aorta with a  maximal diameter of 3 cm. Recommend followup by ultrasound in 3 years. This recommendation follows ACR consensus guidelines: White Paper of the ACR Incidental Findings Committee II on Vascular Findings. Joellyn Rued Radiol 2013; 35:[id number] Signed, Criselda Peaches, MD [facility] Radiology Electronically Signed   By: Jacqulynn Cadet [name]   On: 11/21/2017 10:49       Assessment / Plan:    #1 84 yo WM with acute lower  GI bleed - Diverticular + bleeding scan 5/31 -splenic flexure   Mesenteric angio - negative   No active bleeding yesterday or today and hgb stable  #2 anemia - secondary to acute blood loss- 4 unit transfusion since admit #3  COPD  #4 Afib #5 CHF #6 Back pain  #7 Debilitation - Plan  is for rehab on discharge - Pt expressing desire for no more imaging, pain control for back   Plan; Advance to regular  Diet Follow HGB    Hopefully bleeding has completely resolved - if re-bleeds  Needs to go straight to IR for angio /embolization with attention to splenic flexure of colon   Contact  Amy Esterwood, P.A.-C               [phone]  Principal Problem:   Syncope Active Problems:   OSA and COPD overlap syndrome (HCC)   Essential hypertension   COPD (chronic obstructive pulmonary disease) (HCC)   Hypothyroidism   A-fib (HCC)   Acute on chronic systolic CHF (congestive heart failure) (HCC)   Elevated troponin   UTI (urinary tract infection)   Pressure injury of skin   Orthostatic hypotension   GI bleed   Acute blood loss anemia   Abnormal x-ray of  abdomen    LOS: 9 days   Amy Esterwood  11/22/2017, 1:19 PM  GI ATTENDING  Interval history data reviewed. Patient seen and examined. Agree with interval progress note. No further bleeding. Hemoglobin stable. Advance diet. Continue to monitor for evidence of rebleed.  Docia Chuck. Geri Seminole., [name] [facility] Division of Gastroenterology

## 2017-11-22 NOTE — Progress Notes (Signed)
Patient Demographics:    Richard Rosales, is a 84 y.o. male, DOB - Mar 20, 1931, DVV:[id number]  Admit date - 11/13/2017   Admitting Physician Richard Bulls, MD  Outpatient Primary MD for the patient is Richard Pinto, MD  LOS - 9   Chief Complaint  Patient presents with  . Bradycardia        Subjective:    Richard Rosales today has no fevers, no emesis,  No chest pain, daughter at bedside, questions answered, patient is hungry, no further BM since 1 AM, no obvious bleeding at this time  Assessment  & Plan :    Principal Problem:   Syncope Active Problems:   OSA and COPD overlap syndrome (HCC)   Essential hypertension   COPD (chronic obstructive pulmonary disease) (HCC)   Hypothyroidism   A-fib (HCC)   Acute on chronic systolic CHF (congestive heart failure) (HCC)   Elevated troponin   UTI (urinary tract infection)   Pressure injury of skin   Orthostatic hypotension   GI bleed   Acute blood loss anemia   Abnormal x-ray of abdomen  Brief summary 84 y.o. male  past medical history relevant for A. fib, not on anticoagulation, COPD with chronic hypoxic respiratory failure on home O2, systolic dysfunction CHF and history of recurrent GI bleed admitted on 11/13/2017 from [facility] facility with syncope .  He was found to be orthostatic but presumably from hypovolemia induced by diuresis.    History of recurrent/intermittent diverticular bleed, Tagged red blood cell scan was positive on 11/20/2017 for bleeding in the region of the splenic flexure. Subsequent mesenteric angiogram on 11/20/2017 was negative for active bleeding, thus no embolization. Has had no further bleeding since. Had previously received 4 units of packed red blood cells (2 additional units ordered on 11/22/2017 for total of 6 units as hemoglobin Hgb  has decreased from 9.8 > 8.4 > 8.0 after 2 bloody BMs)    Plan:- 1) recurrent  GI/rectal bleeding--presumed diverticular origin, Tagged red blood cell scan was positive on 11/20/2017 for bleeding in the region of the splenic flexure. Subsequent mesenteric angiogram on 11/20/2017 was negative for active bleeding, thus no embolization. Had no further bleeding and HH was stable for over 24 hours then  Hgb  has decreased from 9.8 > 8.4 > 8.0 after 2 bloody BMs on 11/22/2017. Previously received 4 units of packed red blood cells, (patient had 2 units of packed cells on 11/20/2017 and another 2 units of packed cells on 11/21/2017;;; 2 additional units ordered on 11/22/2017 for total of 6 units this admission.... We will transfuse additional 2 units of packed cells on 11/22/2017 as ordered, each unit over 3 hours, give Lasix 20 mg IV after first unit of packed cells. Risk, benefits and alternatives to transfusion of blood products discussed. Indication for transfusion discussed. Consent obtained  NB!!! Should patient have brisk bleeding again Dr. Henrene Rosales advises to forego packed red blood scan and interventional radiology to go directly for mesenteric angiogram with focus on the splenic flexure area with a goal to hopefully embolize the bleeding vessel.  Trying to do an RBC tagged scan again may delay ability to embolize the bleeding vessel  2) syncope with orthostatic hypotension/ hypovolemia--secondary to #1 above  3)HFrEF--patient  with history of chronic systolic dysfunction CHF, last known EF 40 to 45% , currently does not appear to be volume overloaded, use diuretics with transfusion to avoid volume overload.  Continue metoprolol 12.5 mg twice daily as BP allows  4)Generalized weakness/debility--chronic anemia as a contributing factor, CHF is also contributory, patient also has  spinal stenosis and peripheral neuropathy and lower extremity weakness  5) chronic hypoxic respiratory failure secondary to underlying COPD--continue oxygen supplementation, continue bronchodilators  6) chronic  Afib-declines anticoagulation due to recurrent bleed, continue metoprolol 12.5 mg twice daily for rate control as BP allows, patient is aspirating on Mondays Wednesdays and Fridays  7)Full thickness wound to R great toe:  outpatient follow up with ortho.  Seen by wound care  8)Social/Ethics--discussed with patient and daughter at bedside, patient is a DNR, patient interested in completing the MOST form, palliative care consult requested for goals of care and to have patient and family complete the MOST form   Code Status : DNR   Disposition Plan  : SNF  Consults  :  Gi/IR  DVT Prophylaxis  :    SCDs   Lab Results  Component Value Date   PLT 279 11/22/2017    Inpatient Medications  Scheduled Meds: . aspirin EC  81 mg Oral Q M,W,F  . azelastine  1 spray Each Nare BID  . B-complex with vitamin C  1 tablet Oral Daily  . cholecalciferol  4,000 Units Oral Daily  . furosemide  20 mg Intravenous Once  . levothyroxine  100 mcg Oral QAC breakfast  . mouth rinse  15 mL Mouth Rinse BID  . metoprolol tartrate  12.5 mg Oral BID  . mometasone-formoterol  2 puff Inhalation BID  . multivitamin with minerals  1 tablet Oral Daily  . neomycin-bacitracin-polymyxin   Topical Daily  . pantoprazole  40 mg Oral Q0600  . potassium chloride  10 mEq Oral Daily  . sodium chloride flush  3 mL Intravenous Q12H  . tiotropium  1 capsule Inhalation Daily   Continuous Infusions: . sodium chloride Stopped (11/21/17 1113)  . sodium chloride     PRN Meds:.acetaminophen **OR** acetaminophen, albuterol, bisacodyl, calcium carbonate, diazepam, oxyCODONE, senna-docusate, sodium phosphate, technetium labeled red blood cells    Anti-infectives (From admission, onward)   Start     Dose/Rate Route Frequency Ordered Stop   11/14/17 0030  cefdinir ([name]) capsule 300 mg    Note to Pharmacy:  Patient taking differently: Take 300 mg by mouth two times a day for 3 days     300 mg Oral Every 12 hours 11/13/17 2038  11/16/17 0959        Objective:   Vitals:   11/22/17 0901 11/22/17 1036 11/22/17 1159 11/22/17 1437  BP: 95/65 101/62  90/76  Pulse: (!) 111 (!) 44  (!) 48  Resp:  16  (!) 23  Temp:   98.4 F (36.9 C)   TempSrc:   Oral   SpO2:  99%  100%  Weight:      Height:        Wt Readings from Last 3 Encounters:  11/21/17 101.8 kg (224 lb 6.9 oz)  11/12/17 106.4 kg (234 lb 9.1 oz)  10/31/17 103 kg (227 lb 1.2 oz)     Intake/Output Summary (Last 24 hours) at 11/22/2017 1835 Last data filed at 11/22/2017 1710 Gross per 24 hour  Intake 1038 ml  Output 700 ml  Net 338 ml     Physical Exam  Gen:-  Awake Alert, in no acute distress HEENT:- [place].AT, No sclera icterus Neck-Supple Neck,No JVD,.  Lungs-  CTAB , fair air movement CV- S1, S2 normal, irregularly irregular Abd-  +ve B.Sounds, Abd Soft, No tenderness,    Extremity/Skin:-Trace edema, good pulses Psych-affect is appropriate, oriented x3 Neuro-no new focal deficits, no tremors   Data Review:   Micro Results Recent Results (from the past 240 hour(s))  Urine culture     Status: Abnormal   Collection Time: 11/13/17  3:30 PM  Result Value Ref Range Status   Specimen Description URINE, RANDOM  Final   Special Requests   Final    NONE Performed at [facility], [place] [address], [place],  [address]    Culture MULTIPLE SPECIES PRESENT, SUGGEST RECOLLECTION (A)  Final   Report Status 11/14/2017 FINAL  Final    Radiology Reports Dg Chest 2 View  Result Date: 10/25/2017 CLINICAL DATA:  84 year old male with history of weakness. EXAM: CHEST - 2 VIEW COMPARISON:  Chest x-ray 04/07/2017. FINDINGS: In volumes are low. No consolidative airspace disease. No pleural effusions. Cephalization of the pulmonary vasculature, without frank pulmonary edema. Enlargement of the cardiopericardial silhouette, which could reflect underlying cardiomegaly and/or the presence of a pericardial effusion. Upper mediastinal contours are within  normal limits. Aortic atherosclerosis. IMPRESSION: 1. Enlarged cardiopericardial silhouette, which could reflect cardiomegaly and/or presence of pericardial effusion. If, further evaluation there is clinical concern for pericardial effusion with echocardiography is suggested. 2. Pulmonary venous congestion, without frank pulmonary edema. 3. Aortic atherosclerosis. Electronically Signed   By: Vinnie Langton [name]   On: 10/25/2017 13:17   Ct Head Wo Contrast  Result Date: 10/26/2017 CLINICAL DATA:  Leg weakness for 4 days. EXAM: CT HEAD WITHOUT CONTRAST TECHNIQUE: Contiguous axial images were obtained from the base of the skull through the vertex without intravenous contrast. COMPARISON:  11/11/2013 FINDINGS: Brain: Chronic moderate small vessel ischemic disease of periventricular white matter. Age appropriate involutional changes of the brain. No hydrocephalus. No effacement of the basal cisterns. Cerebellum and brainstem are stable and intact. No intra-axial mass nor extra-axial fluid collections. No large vascular territory infarct. Chronic small lacunar infarct involving the anterior limb of the left internal capsule as before. Vascular: No hyperdense vessel or unexpected calcification. Skull: Negative for fracture or focal lesion. Sinuses/Orbits: Status post bilateral maxillary antrectomies. Minimal ethmoid and sphenoid sinus mucosal thickening. Intact orbits and globes. Other: None IMPRESSION: 1. Chronic microvascular ischemic disease and old left lacunar infarct of the anterior limb of the internal capsule. No acute intracranial abnormality. 2. Status post bilateral maxillary antrectomy. Electronically Signed   By: Ashley Royalty [name]   On: 10/26/2017 19:53   Mr Brain Wo Contrast  Result Date: 11/15/2017 CLINICAL DATA:  Syncope, simple, abnormal neuro exam. Recent hospital admission for lethargy and urinary tract infection. Syncopal episode at skilled nursing facility. EXAM: MRI HEAD WITHOUT CONTRAST  TECHNIQUE: Multiplanar, multiecho pulse sequences of the brain and surrounding structures were obtained without intravenous contrast. COMPARISON:  CT head without contrast 10/26/2017 FINDINGS: Brain: Mild generalized atrophy is present. Moderate diffuse periventricular and subcortical T2 hyperintensities are present bilaterally. Ventricles are proportionate to the degree of atrophy. No significant extra-axial fluid collection is present. The internal auditory canals are within normal limits bilaterally. The brainstem and cerebellum are normal. Vascular: Flow is present in the major intracranial arteries. Skull and upper cervical spine: Skull base is within normal limits. Exaggerated cervical lordosis is present. There is focal central canal stenosis at C3-4  with grade 1 anterolisthesis. Midline sagittal structures are otherwise unremarkable. Sinuses/Orbits: Bilateral maxillary antrostomies are present. No significant residual recurrent sinus disease is present. Small polyps are present within the sphenoid sinuses bilaterally. The mastoid air cells are clear. Globes and orbits are within normal limits. IMPRESSION: 1. No acute intracranial abnormality. 2. Atrophy and white matter changes are moderately advanced for age. The finding is nonspecific but can be seen in the setting of chronic microvascular ischemia, a demyelinating process such as multiple sclerosis, vasculitis, complicated migraine headaches, or as the sequelae of a prior infectious or inflammatory process. 3. Moderate central canal stenosis at C3-4. 4. Previous sinus surgery. Electronically Signed   By: San Morelle [name]   On: 11/15/2017 11:19   Mr Cervical Spine Wo Contrast  Result Date: 11/18/2017 CLINICAL DATA:  Cervical spinal stenosis. EXAM: MRI CERVICAL SPINE WITHOUT CONTRAST TECHNIQUE: Multiplanar, multisequence MR imaging of the cervical spine was performed. No intravenous contrast was administered. COMPARISON:  Radiography  09/18/2016.  Previous MRI 2005. FINDINGS: Patient would not tolerate axial imaging. Only sagittal sequences were obtained. The patient would not tolerate thoracic spine imaging as was ordered. Alignment: 6 mm anterolisthesis C3-4. Vertebrae: Distant fusion at C4-5. Cord: Some cord deformity at the C3-4 level. AP diameter of the canal only approximately 6 mm. Posterior Fossa, vertebral arteries, paraspinal tissues: Negative Disc levels: Foramen magnum sufficiently patent. Ordinary osteoarthritis at C1-2 without stenosis. C2-3 shows facet arthropathy on the left but no central canal stenosis. C3-4 shows central canal stenosis because of facet arthropathy and 6 mm of anterolisthesis. As noted above, the cord could be partially compressed at this level though there is no visible abnormal T2 signal. C4-5 shows distant fusion with sufficient patency of the canal and foramina. C5-6 shows spondylosis with osteophytes that narrow the ventral subarachnoid space but do not compress the cord. C6-7 shows small osteophytes but no compressive stenosis. C7-T1 is normal. Upper thoracic region is unremarkable. IMPRESSION: Spinal stenosis at C3-4. Advanced facet arthropathy with anterolisthesis of 6 mm. The cord may be compressed or partially compressed at this level. See above discussion. Electronically Signed   By: Nelson Chimes [name]   On: 11/18/2017 14:00   Mr Lumbar Spine Wo Contrast  Result Date: 10/26/2017 CLINICAL DATA:  Extreme low back pain. EXAM: MRI LUMBAR SPINE WITHOUT CONTRAST TECHNIQUE: Multiplanar, multisequence MR imaging of the lumbar spine was performed. No intravenous contrast was administered. COMPARISON:  MRI lumbar spine 08/05/2010. FINDINGS: There is extreme motion degradation. Study is marginally diagnostic. Segmentation: Standard. Alignment: Exaggerated lumbar lordosis. 2 mm anterolisthesis L5-S1. 2 mm compensatory retrolisthesis L3-4 L2-3 and trace L1-2. Vertebrae: No worrisome osseous lesion. Endplate  reactive changes related to disc space narrowing. Conus medullaris and cauda equina: Conus extends to the L1. level. Conus and cauda equina appear normal. Paraspinal and other soft tissues: Renal cystic disease, poorly visualized. Disc levels: L1-L2: Disc space narrowing. Central protrusion. Facet arthropathy. No definite impingement. L2-L3: 2 mm retrolisthesis. Central protrusion. Posterior element hypertrophy. Moderate stenosis. BILATERAL L2 and L3 nerve root impingement likely. L3-L4: Disc space narrowing. Central protrusion. Posterior element hypertrophy. Moderate to severe stenosis. BILATERAL L3 and L4 nerve root impingement. L4-L5: Disc space narrowing. Central protrusion. Posterior element hypertrophy. Severe stenosis. BILATERAL L4 and L5 nerve root impingement. L5-S1: 2 mm anterolisthesis. Annular bulge. Posterior element hypertrophy. No definite impingement. IMPRESSION: Marginally diagnostic study demonstrating moderate to severe stenosis throughout the L2-3 through L4-5 disc spaces related to subluxations, disc space narrowing, disc protrusions, and posterior element hypertrophy.  Symptomatic neural impingement could occur at any or all of those levels. See discussion above. Suspected progression since previous MR from 2012. Electronically Signed   By: Staci Righter [name]   On: 10/26/2017 20:45   Nm Gi Blood Loss  Result Date: 11/20/2017 CLINICAL DATA:  Gastrointestinal bleeding. EXAM: NUCLEAR MEDICINE GASTROINTESTINAL BLEEDING SCAN TECHNIQUE: Sequential abdominal images were obtained following intravenous administration of Tc-64m labeled red blood cells. RADIOPHARMACEUTICALS:  25 mCi Tc-72m pertechnetate in-vitro labeled red cells. COMPARISON:  11/18/2017. FINDINGS: Active gastrointestinal bleeding in the left upper quadrant of the abdomen with tracer extending through tortuous bowel loops in the left abdomen to the pelvis. This is following the expected course of the colon with some reflux into the  distal transverse colon. IMPRESSION: Active gastrointestinal bleeding in the region of the splenic flexure of the colon with some reflux into the distal transverse colon and distal passage of tracer into the sigmoid colon. These results will be called to the ordering clinician or representative by the Radiologist Assistant, and communication documented in the PACS or zVision Dashboard. Electronically Signed   By: Claudie Revering [name]   On: 11/20/2017 14:46   Nm Gi Blood Loss  Result Date: 11/18/2017 CLINICAL DATA:  GI bleed EXAM: NUCLEAR MEDICINE GASTROINTESTINAL BLEEDING SCAN TECHNIQUE: Sequential abdominal images were obtained following intravenous administration of Tc-74m labeled red blood cells. RADIOPHARMACEUTICALS:  25 mCi Tc-66m pertechnetate in-vitro labeled red cells. COMPARISON:  None. FINDINGS: Penile uptake. Excretory uptake in the bladder. No findings suspicious for active GI bleed. IMPRESSION: No findings suspicious for active GI bleed. Electronically Signed   By: Julian Hy [name]   On: 11/18/2017 21:53   Ir Angiogram Visceral Selective  Result Date: 11/21/2017 INDICATION: 84 year old male with a history of diverticulosis and acute lower GI bleeding. Tagged nuclear medicine red blood cell study suggests active bleeding in the region of the splenic flexure. He presents for urgent visceral selective angiography and embolization if the bleeding source can be identified. EXAM: IR ULTRASOUND GUIDANCE VASC ACCESS RIGHT; SELECTIVE VISCERAL ARTERIOGRAPHY MEDICATIONS: None ANESTHESIA/SEDATION: Moderate (conscious) sedation was employed during this procedure. A total of Versed 1.5 mg and Fentanyl 150 mcg was administered intravenously. Moderate Sedation Time: 40 minutes. The patient's level of consciousness and vital signs were monitored continuously by radiology nursing throughout the procedure under my direct supervision. CONTRAST:  105 cc Isovue 300 FLUOROSCOPY TIME:  Fluoroscopy Time: 19 minutes 54  seconds (516 mGy). COMPLICATIONS: None immediate. PROCEDURE: Informed consent was obtained from the patient following explanation of the procedure, risks, benefits and alternatives. The patient understands, agrees and consents for the procedure. All questions were addressed. A time out was performed prior to the initiation of the procedure. Maximal barrier sterile technique utilized including caps, mask, sterile gowns, sterile gloves, large sterile drape, hand hygiene, and Betadine prep. The right common femoral artery was interrogated with ultrasound and found to be widely patent. An image was obtained and stored for the medical record. Local anesthesia was attained by infiltration with 1% lidocaine. A small dermatotomy was made. Under real-time sonographic guidance, the vessel was punctured with a 21 gauge micropuncture needle. Using standard technique, the initial micro needle was exchanged over a 0.018 micro wire for a transitional 4 [place] micro sheath. The micro sheath was then exchanged over a 0.035 wire for a 5 French vascular sheath. A RIM catheter was advanced over a Bentson wire into the abdominal aorta. Attempts were made to select the inferior mesenteric artery. Unfortunately, the inferior abdominal aorta  is mildly aneurysmal (maximal diameter 3.0 cm) and the RIM could not engage the orifice. Therefore, the rim catheter was exchanged over a Bentson wire for a SOS catheter which was successfully used to engage the origin of the inferior mesenteric artery. An inferior mesenteric arteriogram was performed. No evidence of active bleeding. Good visualization of the left colon. The SOS catheter was then removed and replaced with a C2 cobra catheter. The Cobra catheter was used to select the celiac artery. A celiac arteriogram was performed. No evidence of upper GI bleeding. No evidence of replaced middle colic artery. The cobra catheter was then used to select the superior mesenteric artery. A superior  mesenteric arteriogram was performed. No evidence of acute arterial bleeding. At this time, the procedure was terminated. The patient was relatively confused and having difficulty staying still. Hemostasis was attained with the assistance of an Angio-Seal extra arterial vascular plug. IMPRESSION: 1. Negative three-vessel visceral arteriogram. No evidence of active lower GI bleeding at this time. 2. Mild fusiform aneurysmal dilatation of the infrarenal abdominal aorta with a maximal diameter of 3 cm. Recommend followup by ultrasound in 3 years. This recommendation follows ACR consensus guidelines: White Paper of the ACR Incidental Findings Committee II on Vascular Findings. Joellyn Rued Radiol 2013; 69:[id number] Signed, Criselda Peaches, MD [facility] Radiology Electronically Signed   By: Jacqulynn Cadet [name]   On: 11/21/2017 10:49   Ir Angiogram Visceral Selective  Result Date: 11/21/2017 INDICATION: 84 year old male with a history of diverticulosis and acute lower GI bleeding. Tagged nuclear medicine red blood cell study suggests active bleeding in the region of the splenic flexure. He presents for urgent visceral selective angiography and embolization if the bleeding source can be identified. EXAM: IR ULTRASOUND GUIDANCE VASC ACCESS RIGHT; SELECTIVE VISCERAL ARTERIOGRAPHY MEDICATIONS: None ANESTHESIA/SEDATION: Moderate (conscious) sedation was employed during this procedure. A total of Versed 1.5 mg and Fentanyl 150 mcg was administered intravenously. Moderate Sedation Time: 40 minutes. The patient's level of consciousness and vital signs were monitored continuously by radiology nursing throughout the procedure under my direct supervision. CONTRAST:  105 cc Isovue 300 FLUOROSCOPY TIME:  Fluoroscopy Time: 19 minutes 54 seconds (516 mGy). COMPLICATIONS: None immediate. PROCEDURE: Informed consent was obtained from the patient following explanation of the  procedure, risks, benefits and alternatives. The patient understands, agrees and consents for the procedure. All questions were addressed. A time out was performed prior to the initiation of the procedure. Maximal barrier sterile technique utilized including caps, mask, sterile gowns, sterile gloves, large sterile drape, hand hygiene, and Betadine prep. The right common femoral artery was interrogated with ultrasound and found to be widely patent. An image was obtained and stored for the medical record. Local anesthesia was attained by infiltration with 1% lidocaine. A small dermatotomy was made. Under real-time sonographic guidance, the vessel was punctured with a 21 gauge micropuncture needle. Using standard technique, the initial micro needle was exchanged over a 0.018 micro wire for a transitional 4 [place] micro sheath. The micro sheath was then exchanged over a 0.035 wire for a 5 French vascular sheath. A RIM catheter was advanced over a Bentson wire into the abdominal aorta. Attempts were made to select the inferior mesenteric artery. 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 Therefore, the rim catheter was exchanged over a Bentson wire for a SOS catheter which was successfully used to engage the origin of the  inferior mesenteric artery. An inferior mesenteric arteriogram was performed. No evidence of active bleeding. Good visualization of the left colon. The SOS catheter was then removed and replaced with a C2 cobra catheter. The Cobra catheter was used to select the celiac artery. A celiac arteriogram was performed. No evidence of upper GI bleeding. No evidence of replaced middle colic artery. The cobra catheter was then used to select the superior mesenteric artery. A superior mesenteric arteriogram was performed. No evidence of acute arterial bleeding. At this time, the procedure was terminated. The patient was  relatively confused and having difficulty staying still. Hemostasis was attained with the assistance of an Angio-Seal extra arterial vascular plug. IMPRESSION: 1. Negative three-vessel visceral arteriogram. No evidence of active lower GI bleeding at this time. 2. Mild fusiform aneurysmal dilatation of the infrarenal abdominal aorta with a maximal diameter of 3 cm. Recommend followup by ultrasound in 3 years. This recommendation follows ACR consensus guidelines: White Paper of the ACR Incidental Findings Committee II on Vascular Findings. Joellyn Rued Radiol 2013; [phone]:[id number] Signed, Criselda Peaches, MD [facility] Radiology Electronically Signed   By: Jacqulynn Cadet [name]   On: 11/21/2017 10:49   US Renal  Result Date: 11/09/2017 CLINICAL DATA:  Initial evaluation for urinary tract infection. EXAM: RENAL / URINARY TRACT ULTRASOUND COMPLETE COMPARISON:  None. FINDINGS: Right Kidney: Length: 10.8 cm. Echogenicity grossly within normal limits. No mass lesion. No nephrolithiasis or hydronephrosis. Left Kidney: Length: 11.7 cm. Echogenicity grossly within normal limits. 4.1 x 4.4 x 4.8 cm exophytic cyst present at the lower pole of the left kidney. No associated vascularity. No hydronephrosis or nephrolithiasis. Bladder: Appears normal for degree of bladder distention. IMPRESSION: 1. No acute abnormality.  No hydronephrosis. 2. 4.8 cm exophytic left renal cyst. Electronically Signed   By: Jeannine Boga [name]   On: 11/09/2017 21:51   Ir US Guide Vasc Access Right  Result Date: 11/21/2017 INDICATION: 84 year old male with a history of diverticulosis and acute lower GI bleeding. Tagged nuclear medicine red blood cell study suggests active bleeding in the region of the splenic flexure. He presents for urgent visceral selective angiography and embolization if the bleeding source can be identified. EXAM: IR ULTRASOUND GUIDANCE VASC ACCESS RIGHT; SELECTIVE VISCERAL  ARTERIOGRAPHY MEDICATIONS: None ANESTHESIA/SEDATION: Moderate (conscious) sedation was employed during this procedure. A total of Versed 1.5 mg and Fentanyl 150 mcg was administered intravenously. Moderate Sedation Time: 40 minutes. The patient's level of consciousness and vital signs were monitored continuously by radiology nursing throughout the procedure under my direct supervision. CONTRAST:  105 cc Isovue 300 FLUOROSCOPY TIME:  Fluoroscopy Time: 19 minutes 54 seconds (516 mGy). COMPLICATIONS: None immediate. PROCEDURE: Informed consent was obtained from the patient following explanation of the procedure, risks, benefits and alternatives. The patient understands, agrees and consents for the procedure. All questions were addressed. A time out was performed prior to the initiation of the procedure. Maximal barrier sterile technique utilized including caps, mask, sterile gowns, sterile gloves, large sterile drape, hand hygiene, and Betadine prep. The right common femoral artery was interrogated with ultrasound and found to be widely patent. An image was obtained and stored for the medical record. Local anesthesia was attained by infiltration with 1% lidocaine. A small dermatotomy was made. Under real-time sonographic guidance, the vessel was punctured with a 21 gauge micropuncture needle. Using standard technique, the initial micro needle was exchanged over a 0.018 micro wire for a transitional 4 [place] micro sheath. The micro sheath  was then exchanged over a 0.035 wire for a 5 [place] vascular sheath. A RIM catheter was advanced over a Bentson wire into the abdominal aorta. Attempts were made to select the inferior mesenteric artery. 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 Therefore, the rim catheter was exchanged over a Bentson wire for a SOS catheter which was successfully used to engage the origin of the inferior  mesenteric artery. An inferior mesenteric arteriogram was performed. No evidence of active bleeding. Good visualization of the left colon. The SOS catheter was then removed and replaced with a C2 cobra catheter. The Cobra catheter was used to select the celiac artery. A celiac arteriogram was performed. No evidence of upper GI bleeding. No evidence of replaced middle colic artery. The cobra catheter was then used to select the superior mesenteric artery. A superior mesenteric arteriogram was performed. No evidence of acute arterial bleeding. At this time, the procedure was terminated. The patient was relatively confused and having difficulty staying still. Hemostasis was attained with the assistance of an Angio-Seal extra arterial vascular plug. IMPRESSION: 1. Negative three-vessel visceral arteriogram. No evidence of active lower GI bleeding at this time. 2. Mild fusiform aneurysmal dilatation of the infrarenal abdominal aorta with a maximal diameter of 3 cm. Recommend followup by ultrasound in 3 years. This recommendation follows ACR consensus guidelines: White Paper of the ACR Incidental Findings Committee II on Vascular Findings. Joellyn Rued Radiol 2013; [id number] Signed, Criselda Peaches, MD [facility] Radiology Electronically Signed   By: Jacqulynn Cadet [name]   On: 11/21/2017 10:49   Dg Chest Port 1 View  Result Date: 11/15/2017 CLINICAL DATA:  Hypoxia. EXAM: PORTABLE CHEST 1 VIEW COMPARISON:  Multiple prior plain films including most recently 11/13/2017. CT 11/18/2013. FINDINGS: Mildly degraded exam due to AP portable technique and patient body habitus. Chin overlies the apices. Midline trachea. Moderate cardiomegaly. Atherosclerosis in the transverse aorta. Right paratracheal soft tissue fullness is similar over multiple prior exams and likely related to right-sided thyroid enlargement. Probable layering bilateral pleural effusions. No pneumothorax.  No congestive failure. Bibasilar airspace disease. IMPRESSION: Similar appearance of layering bilateral pleural effusions and bibasilar atelectasis or infection. Cardiomegaly without congestive failure. Aortic Atherosclerosis (ICD10-I70.0). Electronically Signed   By: Abigail Miyamoto [name]   On: 11/15/2017 14:04   Dg Chest Port 1 View  Result Date: 11/13/2017 CLINICAL DATA:  Bradycardia.  Former smoker. EXAM: PORTABLE CHEST 1 VIEW COMPARISON:  11/11/2017; 10/25/2017; 04/07/2017 FINDINGS: Grossly unchanged enlarged cardiac silhouette and mediastinal contours with atherosclerotic plaque within thoracic aorta. Grossly unchanged small bilateral effusions and associated bibasilar heterogeneous/consolidative opacities, left greater than right. No new focal airspace opacities. Re demonstrated thinning the biapical pulmonary parenchyma however the pulmonary vasculature appears indistinct within mid and lower lungs. No definite pneumothorax, though note, evaluation degraded secondary to patient's overlying chin. No acute osseus abnormalities. IMPRESSION: Similar findings of cardiomegaly and pulmonary edema superimposed on advanced emphysematous change with small bilateral effusions and associated bibasilar opacities, left greater than right, atelectasis versus infiltrate. Electronically Signed   By: Sandi Mariscal [name]   On: 11/13/2017 15:59   Dg Chest Port 1 View  Result Date: 11/11/2017 CLINICAL DATA:  Congestive failure EXAM: PORTABLE CHEST 1 VIEW COMPARISON:  11/08/2017 FINDINGS: Cardiac shadow is enlarged but stable. Aortic calcifications are again seen. Fullness in the right paratracheal region is again noted and stable. This is related to vascularity and  the right thyroid lobe as seen on prior CT examination from 2015. Small left pleural effusion is noted. No focal infiltrate is seen. IMPRESSION: Stable small pleural effusion on the left. No new focal abnormality is seen. Electronically Signed   By: Inez Catalina [name]    On: 11/11/2017 15:50   Dg Chest Portable 1 View  Result Date: 11/08/2017 CLINICAL DATA:  Patient with worsening lethargy. EXAM: PORTABLE CHEST 1 VIEW COMPARISON:  Chest radiograph 10/28/2017. FINDINGS: Limited rotated exam. Monitoring leads overlie the patient. Stable cardiomegaly. Similar right paratracheal density. Thoracic aortic vascular calcifications. Low lung volumes. Bibasilar heterogeneous opacities. Mild interstitial opacities bilaterally. No definite pleural effusion or pneumothorax. IMPRESSION: Cardiomegaly. Interstitial opacities bilaterally may represent mild edema. Basilar atelectasis. Electronically Signed   By: Lovey Newcomer [name]   On: 11/08/2017 16:22   Dg Chest Port 1 View  Result Date: 10/28/2017 CLINICAL DATA:  Monoclonal gammopathy of unknown significance. History of CHF, COPD, atrial fibrillation, on home oxygen. EXAM: PORTABLE CHEST 1 VIEW COMPARISON:  Chest x-ray of Oct 25, 2017 FINDINGS: The lungs are well-expanded. There is stable soft tissue fullness in the right paratracheal region. The cardiac silhouette remains enlarged. The pulmonary vascularity is not engorged. There is calcification in the wall of the aortic arch. There degenerative changes of both shoulders. IMPRESSION: Resolution of pulmonary interstitial edema. Stable cardiac enlargement. No pulmonary vascular congestion. COPD. No acute pneumonia. Stable soft tissue fullness in the right paratracheal region which reflects prominent vascular structures on a CT scan in May of 2015. Thoracic aortic atherosclerosis. Electronically Signed   By: David  Martinique [name]   On: 10/28/2017 11:05   Dg Swallowing Func-speech Pathology  Result Date: 10/27/2017 Objective Swallowing Evaluation: Type of Study: MBS-Modified Barium Swallow Study  Patient Details Name: DANIL WEDGE MRN: [id number] Date of Birth: 07/24/1930 Today's Date: 10/27/2017 Time: SLP Start Time (ACUTE ONLY): 1425 -SLP Stop Time (ACUTE ONLY): 1500 SLP Time Calculation (min)  (ACUTE ONLY): 35 min Past Medical History: Past Medical History: Diagnosis Date . Acute bronchitis  . Adenomatous colon polyp  . Allergic rhinitis  . Anemia   iron deficient . Atrial fibrillation ([place])  . Atrial flutter ([place])  . Chronic gastritis  . COPD (chronic obstructive pulmonary disease) ([place])  . Diastolic dysfunction  . Diverticulosis of colon   recurrent GI bleeding . DJD (degenerative joint disease)  . GERD (gastroesophageal reflux disease)  . GI bleed  . HTN (hypertension)  . Hyperplastic colon polyp 2003 . Hypothyroidism  . Moderate aortic insufficiency  . OSA (obstructive sleep apnea)   compliants w/ CPAP . Positive PPD   remote . Shortness of breath  . Venous insufficiency  Past Surgical History: Past Surgical History: Procedure Laterality Date . CARDIAC CATHETERIZATION  7.27.05  revealed a preserved EF with no CAD . COLONOSCOPY   . HERNIA REPAIR Bilateral  . JOINT REPLACEMENT  2001  lt total knee-rt one done 00 . NASAL SINUS SURGERY   . POLYPECTOMY    adenomatous polyps 2010 . SHOULDER ARTHROSCOPY   . TOE SURGERY Right  . TONSILLECTOMY   . TOTAL KNEE ARTHROPLASTY    bilateral HPI: DURWIN DAVISSON is a 84 y.o. male with medical history significant of HTN, A. fib, COPD on 2 L of nasal cannula oxygen, CHF, hypothyroidism, MGUS, OSA on CPAP, and venous stasis; who presents with complaints of inability to ambulate.  At baseline patient uses a walker to ambulate.  2 days prior to admit pt have problems standing and fell  back into a chair.   Pt unable to care for himself at home and his primary care provider advised him to come back into the hospital for further treatment.  He he has these chronic venous stasis ulcerations.  Patient also report to md having a 60 pound weight loss after changing his diet in the last 18 months, but for the last 6 months he reports that his weight is stable.  Other associated symptoms include increased shortness of breath and worsening tremor.  Patient denies any history of  Parkinson's disease.  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  Family reports to RN pt coughs with intake - RN informed MD and swallow eval ordered.  Imaging study chest showed venous pulmonary congestion and possible pericardial effusion.  Pt found to have degenerative changes  C4-C5, C5-C6, C6-C7 on prior imaging study 2005.  SLP recommended to defer clinical eval and proceed with MBS.  Subjective: pt awake in chair Assessment / Plan / Recommendation CHL IP CLINICAL IMPRESSIONS 10/27/2017 Clinical Impression Patient presents with mild pharyngoesophageal phase dysphagia without aspiration and only trace penetration of sequential swallows of thin.  Decreased timing and adequacy of laryngeal closure resulted in mild penetration with thin.  Pt observed to have retained secretions in pharynx that mixed with barium.  He was sensate to mild vallecular residuals and requested liquids to clear.   Decreased UES opening noted with resultant residuals  - ? prominent CP and mild backflow of liquids to distal pharynx without sensation..   Fortunately this was minimal and pt's lateral channels provided protection.    Suspect this backflow and pt's kyphosis (known h/o GERD) may contribute to pt's coughing over intake.  Of note, pt did NOT cough througout entire MBS - however after MBS, he did cough and expectorate viscous clear secretions.  Advised him to keep his cough/hock strong for airway protection.  Using live video, educated pt to findings/recommendations.  Will follow for dysphagia management.  SLP Visit Diagnosis Dysphagia, pharyngeal phase (R13.13);Dysphagia, pharyngoesophageal phase (R13.14) Attention and concentration deficit following -- Frontal lobe and executive function deficit following -- Impact on safety and function Moderate aspiration risk;Risk for inadequate nutrition/hydration   CHL IP TREATMENT RECOMMENDATION 10/27/2017 Treatment Recommendations  Therapy as outlined in treatment plan below   Prognosis 10/27/2017 Prognosis for Safe Diet Advancement Good Barriers to Reach Goals -- Barriers/Prognosis Comment -- CHL IP DIET RECOMMENDATION 10/27/2017 SLP Diet Recommendations Regular solids;Thin liquid Liquid Administration via Straw Medication Administration Whole meds with liquid Compensations Minimize environmental distractions;Slow rate;Small sips/bites;Other (Comment) Postural Changes Remain semi-upright after after feeds/meals (Comment);Seated upright at 90 degrees   CHL IP OTHER RECOMMENDATIONS 10/27/2017 Recommended Consults -- Oral Care Recommendations Oral care BID Other Recommendations --   No flowsheet data found.  CHL IP FREQUENCY AND DURATION 10/27/2017 Speech Therapy Frequency (ACUTE ONLY) min 2x/week Treatment Duration 1 week      CHL IP ORAL PHASE 10/27/2017 Oral Phase Impaired Oral - Pudding Teaspoon -- Oral - Pudding Cup -- Oral - Honey Teaspoon -- Oral - Honey Cup -- Oral - Nectar Teaspoon -- Oral - Nectar Cup WFL Oral - Nectar Straw -- Oral - Thin Teaspoon -- Oral - Thin Cup WFL Oral - Thin Straw WFL Oral - Puree WFL Oral - Mech Soft WFL Oral - Regular -- Oral - Multi-Consistency -- Oral - Pill WFL Oral Phase - Comment --  CHL IP PHARYNGEAL PHASE 10/27/2017 Pharyngeal Phase Impaired Pharyngeal- Pudding Teaspoon --  Pharyngeal -- Pharyngeal- Pudding Cup -- Pharyngeal -- Pharyngeal- Honey Teaspoon -- Pharyngeal -- Pharyngeal- Honey Cup -- Pharyngeal -- Pharyngeal- Nectar Teaspoon -- Pharyngeal -- Pharyngeal- Nectar Cup Reduced tongue base retraction;Pharyngeal residue - valleculae Pharyngeal -- Pharyngeal- Nectar Straw -- Pharyngeal -- Pharyngeal- Thin Teaspoon -- Pharyngeal -- Pharyngeal- Thin Cup Pharyngeal residue - valleculae;Reduced tongue base retraction;Penetration/Aspiration during swallow;Reduced laryngeal elevation Pharyngeal Material enters airway, remains ABOVE vocal cords and not ejected out Pharyngeal- Thin Straw Reduced tongue base  retraction;Pharyngeal residue - valleculae;Reduced laryngeal elevation;Reduced airway/laryngeal closure Pharyngeal -- Pharyngeal- Puree WFL Pharyngeal -- Pharyngeal- Mechanical Soft -- Pharyngeal -- Pharyngeal- Regular WFL Pharyngeal -- Pharyngeal- Multi-consistency -- Pharyngeal -- Pharyngeal- Pill WFL Pharyngeal -- Pharyngeal Comment --  CHL IP CERVICAL ESOPHAGEAL PHASE 10/27/2017 Cervical Esophageal Phase Impaired Pudding Teaspoon -- Pudding Cup -- Honey Teaspoon -- Honey Cup -- Nectar Teaspoon -- Nectar Cup -- Nectar Straw -- Thin Teaspoon -- Thin Cup Reduced cricopharyngeal relaxation;Prominent cricopharyngeal segment Thin Straw Reduced cricopharyngeal relaxation;Prominent cricopharyngeal segment Puree Reduced cricopharyngeal relaxation;Prominent cricopharyngeal segment Mechanical Soft -- Regular Reduced cricopharyngeal relaxation;Prominent cricopharyngeal segment Multi-consistency -- Pill Reduced cricopharyngeal relaxation;Prominent cricopharyngeal segment Cervical Esophageal Comment barium tablet taken with thin easily transited though pharynx and esophagus, pt did appear to have minimal stasis at mid-esophagus without sensation, this eventually cleared independently No flowsheet data found. Janett Labella [place], [facility] [phone]                CBC Recent Labs  Lab 11/20/17 [phone]  11/20/17 1511 11/21/17 0243 11/21/17 1452 11/22/17 0927 11/22/17 1552  WBC 9.3  --  11.3* 8.7 8.4 7.6  --   HGB 7.8*   < > 7.5* 7.9* 9.8* 8.4* 8.0*  HCT 23.0*   < > 23.0* 24.1* 29.3* 25.4* 23.6*  PLT 235  --  280 245 259 279  --   MCV 90.6  --  92.4 89.3 86.7 88.5  --   MCH 30.7  --  30.1 29.3 29.0 29.3  --   MCHC 33.9  --  32.6 32.8 33.4 33.1  --   RDW 13.3  --  13.8 15.3 15.9* 16.0*  --   LYMPHSABS 2.0  --   --  1.5  --   --   --   MONOABS 0.8  --   --  0.7  --   --   --   EOSABS 0.5  --   --  0.3  --   --   --   BASOSABS 0.0  --   --  0.0  --   --   --    < > = values in this interval not  displayed.    Chemistries  Recent Labs  Lab 11/16/17 0340 11/17/17 0609 11/18/17 0752 11/22/17 0627 11/22/17 0927  NA 136 135 133* 134* 134*  K 4.5 4.4 4.5 4.2 3.5  CL 94* 95* 95* 100* 98*  CO2 35* 33* 28 24 28   GLUCOSE 96 92 89 99 138*  BUN 8 8 13  28* 28*  CREATININE 0.75 0.73 0.77 0.86 0.98  CALCIUM 8.6* 8.7* 8.2* 7.7* 7.8*  MG 2.3 2.1 1.9  --   --   AST 28  --   --   --   --   ALT 23  --   --   --   --   ALKPHOS 93  --   --   --   --   BILITOT 0.7  --   --   --   --    ------------------------------------------------------------------------------------------------------------------  No results for input(s): CHOL, HDL, LDLCALC, TRIG, CHOLHDL, LDLDIRECT in the last 72 hours.  Lab Results  Component Value Date   HGBA1C 5.6 08/12/2017   ------------------------------------------------------------------------------------------------------------------ No results for input(s): TSH, T4TOTAL, T3FREE, THYROIDAB in the last 72 hours.  Invalid input(s): FREET3 ------------------------------------------------------------------------------------------------------------------ No results for input(s): VITAMINB12, FOLATE, FERRITIN, TIBC, IRON, RETICCTPCT in the last 72 hours.  Coagulation profile Recent Labs  Lab 11/19/17 0500  INR 1.15    No results for input(s): DDIMER in the last 72 hours.  Cardiac Enzymes No results for input(s): CKMB, TROPONINI, MYOGLOBIN in the last 168 hours.  Invalid input(s): CK ------------------------------------------------------------------------------------------------------------------    Component Value Date/Time   BNP 698.4 (H) 11/14/2017 0245   BNP 106.8 (H) 11/17/2013 1213   NB!!! Should patient have brisk bleeding again Dr. Henrene Rosales advises to forego packed red blood scan and interventional radiology to go directly for mesenteric angiogram with focus on the splenic flexure area with a goal to hopefully embolize the bleeding vessel.  Trying  to do an RBC tagged scan again may delay ability to embolize the bleeding vessel   Roxan Hockey M.D on 11/22/2017 at 6:35 PM  Between 7am to 7pm - Pager - [phone]  After 7pm go to [email or web address] - password [email or web address]  [facility] -  Office  [phone]   Voice Recognition Viviann Spare dictation system was used to create this note, attempts have been made to correct errors. Please contact the author with questions and/or clarifications.

## 2017-11-22 NOTE — Progress Notes (Signed)
Patient ID: KERON NEENAN, male   DOB: January 14, 1931, 84 y.o.   MRN: [id number]   Dr. Denton Brick updated on patient status. Two melena stools blood tinged very odorous. Hgb today has decreased from 9.8 > 8.4 > 8.0. Dr. Henrene Pastor with [facility] came for consult. Dr. Denton Brick to consult with Dr. Henrene Pastor for [place]. Will continue to follow patient as needed.

## 2017-11-23 ENCOUNTER — Encounter (HOSPITAL_COMMUNITY): Payer: Self-pay | Admitting: Interventional Radiology

## 2017-11-23 DIAGNOSIS — D62 Acute posthemorrhagic anemia: Secondary | ICD-10-CM

## 2017-11-23 DIAGNOSIS — G4733 Obstructive sleep apnea (adult) (pediatric): Secondary | ICD-10-CM

## 2017-11-23 DIAGNOSIS — I10 Essential (primary) hypertension: Secondary | ICD-10-CM

## 2017-11-23 DIAGNOSIS — K5731 Diverticulosis of large intestine without perforation or abscess with bleeding: Principal | ICD-10-CM

## 2017-11-23 DIAGNOSIS — K921 Melena: Secondary | ICD-10-CM

## 2017-11-23 LAB — CBC
HEMATOCRIT: 29.1 % — AB (ref 39.0–52.0)
HEMOGLOBIN: 9.6 g/dL — AB (ref 13.0–17.0)
MCH: 29.5 pg (ref 26.0–34.0)
MCHC: 33 g/dL (ref 30.0–36.0)
MCV: 89.5 fL (ref 78.0–100.0)
Platelets: 222 10*3/uL (ref 150–400)
RBC: 3.25 MIL/uL — ABNORMAL LOW (ref 4.22–5.81)
RDW: 15.5 % (ref 11.5–15.5)
WBC: 6.4 10*3/uL (ref 4.0–10.5)

## 2017-11-23 LAB — BASIC METABOLIC PANEL
Anion gap: 8 (ref 5–15)
BUN: 22 mg/dL — AB (ref 6–20)
CHLORIDE: 98 mmol/L — AB (ref 101–111)
CO2: 30 mmol/L (ref 22–32)
CREATININE: 0.75 mg/dL (ref 0.61–1.24)
Calcium: 8 mg/dL — ABNORMAL LOW (ref 8.9–10.3)
GFR calc Af Amer: >60 mL/min (ref >60)
GFR calc non Af Amer: >60 mL/min (ref >60)
Glucose, Bld: 97 mg/dL (ref 65–99)
Potassium: 3.3 mmol/L — ABNORMAL LOW (ref 3.5–5.1)
Sodium: 136 mmol/L (ref 135–145)

## 2017-11-23 LAB — GLUCOSE, CAPILLARY: Glucose-Capillary: 81 mg/dL (ref 65–99)

## 2017-11-23 NOTE — Progress Notes (Signed)
Initial Nutrition Assessment  DOCUMENTATION CODES:   Obesity unspecified  INTERVENTION:  - Continue to encourage PO intakes.  - RD will monitor for nutrition-related needs.   NUTRITION DIAGNOSIS:   Inadequate oral intake related to acute illness, other (see comment)(previous diet orders with recent advancement to Heart Healthy) as evidenced by other (comment)(previously NPO versus CLD).  GOAL:   Patient will meet greater than or equal to 90% of their needs  MONITOR:   PO intake, Weight trends, Labs  REASON FOR ASSESSMENT:   LOS(day 10)  ASSESSMENT:   84 y.o. male  past medical history relevant for A. fib, not on anticoagulation, COPD with chronic hypoxic respiratory failure on home O2, systolic dysfunction CHF and history of recurrent GI bleed admitted on 11/13/2017 from [facility] with syncope .  He was found to be orthostatic but presumably from hypovolemia induced by diuresis.  Pt was on Heart Healthy diet from 5/24-5/29. Then changed to NPO and then FLD on 5/29. On 5/31 he was again made NPO and advanced to CLD on 6/1. He was advanced to Heart Healthy again yesterday at 2:00 PM. Per chart review, he consumed 50% of lunch yesterday. Visualized breakfast tray with ~75% completion. Pt denies any issues with chewing or swallowing but does state that grits feel like they get stuck in his throat so he does not eat them. He denies abdominal pain or nausea this AM or at any time during hospitalization.   Per Dr. Talmadge Coventry note yesterday evening: recurrent GI/rectal bleeding presumed to be diverticular in origin; he has had 6 units of PRBC this admission. Generalized weakness/debility, full thickness wound to R great toe documented in his note.   Medications reviewed; 1 tablet vitamin B-complex with vitamin C/day, 4000 units vitamin D/day, 20 mg IV Lasix x1 yesterday, 100 mcg oral Synthroid/day, daily multivitamin with minerals. Labs reviewed; K: 3.3 mmol/L, Cl: 98 mmol/L,  BUN: 22 mg/dL, Ca: 8 mg/dL.      NUTRITION - FOCUSED PHYSICAL EXAM:  Completed; no muscle and no fat wasting; moderate edema to BLE  Diet Order:   Diet Order           Diet Heart Room service appropriate? Yes; Fluid consistency: Thin  Diet effective now          EDUCATION NEEDS:   No education needs have been identified at this time  Skin:  Skin Assessment: Skin Integrity Issues: Skin Integrity Issues:: Stage I, Incisions, Other (Comment) Stage I: buttocks Incisions: R groin (5/31) Other: full thickness to R great toe; venous stasis ulcers to bilateral legs  Last BM:  6/2  Height:   Ht Readings from Last 1 Encounters:  11/13/17 5\' 9"  (1.753 m)    Weight:   Wt Readings from Last 1 Encounters:  11/23/17 227 lb (103 kg)    Ideal Body Weight:  72.73 kg  BMI:  Body mass index is 33.52 kg/m.  Estimated Nutritional Needs:   Kcal:  1545-1750 (15-17 kcal/kg)  Protein:  82-100 grams (0.8-1 grams/kg)  Fluid:  >/= 1.8 L/day      Jarome Matin, MS, RD, LDN, [facility] Inpatient Clinical Dietitian Pager # [phone] After hours/weekend pager # [phone]

## 2017-11-23 NOTE — Progress Notes (Signed)
Clinical Social Worker received a phone call from Richard Rosales patients daughter-in-law. Richard Rosales stated she wanted to reach out to [name] since the family has CSW number from previous admissions. Richard Rosales stated her concerns for patient stating she feels that patient will need palliative to address his constant readmission. CSW stated to Richard Rosales that MD has already requested for palliative to meet with family but unfortunately CSW is not aware of when palliative team will meet with them.  Richard Rosales stated the family would also like some updates from the MD on patients care and how his been doing. CSW relayed family's concern to MD. MD stated he will reach out to family and answer any questions/concerns they may have. CSW also reached out to the CSW on the unit following him to make her aware of concerns they have expressed.  [name], MSW,  [name]

## 2017-11-23 NOTE — Progress Notes (Addendum)
Daily Rounding Note  11/23/2017, 9:23 AM  LOS: 10 days   SUBJECTIVE:   Chief complaint: Hematochezia   Only had smear of dark stool yesterday, nothing since.  Eager to eat his first solid meal, eggs/pancakes just served.  No abd pain.  No nausea.      OBJECTIVE:         Vital signs in last 24 hours:    Temp:  [96 F (35.6 C)-98.4 F (36.9 C)] 98.1 F (36.7 C) (06/03 0734) Pulse Rate:  [44-109] 88 (06/03 0500) Resp:  [13-23] 23 (06/03 0734) BP: (80-112)/(48-80) 97/71 (06/03 0734) SpO2:  [94 %-100 %] 96 % (06/03 0827) Weight:  [227 lb (103 kg)] 227 lb (103 kg) (06/03 0637) Last BM Date: 11/22/17 Filed Weights   11/20/17 0512 11/21/17 0149 11/23/17 6962  Weight: 229 lb 11.5 oz (104.2 kg) 224 lb 6.9 oz (101.8 kg) 227 lb (103 kg)   General: frail.  Obese.  Alert.  Pleasant and conversational Heart: RRR Chest: clear bil.  No labored breathing or cough Abdomen: soft, NT, ND.  Obese.  Active BS  Extremities: LE edema improved Neuro/Psych:    Fully alert and oriented.  Moves all 4 limbs, no tremor.    Intake/Output from previous day: 06/02 0701 - 06/03 0700 In: 9528 [P.O.:1160; I.V.:20; Blood:630] Out: 500 [Urine:500]  Intake/Output this shift: Total I/O In: 60 [P.O.:60] Out: -   Lab Results: Recent Labs    11/21/17 1452 11/22/17 0927 11/22/17 1552 11/23/17 0543  WBC 8.4 7.6  --  6.4  HGB 9.8* 8.4* 8.0* 9.6*  HCT 29.3* 25.4* 23.6* 29.1*  PLT 259 279  --  222   BMET Recent Labs    11/22/17 0627 11/22/17 0927 11/23/17 0543  NA 134* 134* 136  K 4.2 3.5 3.3*  CL 100* 98* 98*  CO2 24 28 30   GLUCOSE 99 138* 97  BUN 28* 28* 22*  CREATININE 0.86 0.98 0.75  CALCIUM 7.7* 7.8* 8.0*    Studies/Results: No results found.   Scheduled Meds: . aspirin EC  81 mg Oral Q M,W,F  . azelastine  1 spray Each Nare BID  . B-complex with vitamin C  1 tablet Oral Daily  . cholecalciferol  4,000 Units Oral Daily    . levothyroxine  100 mcg Oral QAC breakfast  . mouth rinse  15 mL Mouth Rinse BID  . metoprolol tartrate  12.5 mg Oral BID  . mometasone-formoterol  2 puff Inhalation BID  . multivitamin with minerals  1 tablet Oral Daily  . neomycin-bacitracin-polymyxin   Topical Daily  . pantoprazole  40 mg Oral Q0600  . potassium chloride  10 mEq Oral Daily  . sodium chloride flush  3 mL Intravenous Q12H  . tiotropium  1 capsule Inhalation Daily   Continuous Infusions: . sodium chloride Stopped (11/21/17 1113)   PRN Meds:.acetaminophen **OR** acetaminophen, albuterol, bisacodyl, calcium carbonate, diazepam, oxyCODONE, senna-docusate, sodium phosphate, technetium labeled red blood cells   ASSESMENT:   *   Acute lower GIB, diverticular.  Bleeding scan 5/29 normal.   + bleeding at splenic flexure on 5/31 bleeding scan.  Mesenteric angio 5/31 negative.    *  ABL anemia.  S/p PRBC x 6 so far.  Last 2 this AM, after Hgb dip to 8.  Marland Kitchen       PLAN   *  Observation.  CBC in AM.      Richard Rosales  11/23/2017, 9:23  AM Phone [phone]   I have discussed the case with the PA, and that is the plan I formulated. I personally interviewed and examined the patient.  His diverticular bleeding has stopped. Hemoglobin stable after transfusion. He has had no abdominal pain throughout this hospitalization.  If he has recurrence of overt brisk lower GI bleeding, he needs to go directly to mesenteric angiography after consultation with interventional radiology.  We will continue to observe him. If there is no recurrent bleeding, I am hopeful he will be for discharge soon.  Nelida Meuse III Office: [phone]

## 2017-11-23 NOTE — Progress Notes (Signed)
Physical Therapy Treatment Patient Details Name: Richard Rosales MRN: [id number] DOB: [birth year] Today's Date: 11/23/2017    History of Present Illness 84 y.o. male admitted 11/13/17 after syncopal event at SNF. Pt was recently d/c from hospital 5/23 for management of lethargy, hypokalemia, and UTI. PMH includes: systolic CHF, a fib not on anticoagulation, COPD with chronic hypoxic respiratory failure and UTI. Patients BP was 81/47 at ED arrival. Chest Xray notable for cardiomegaly with pulmonary edema superimposed on emphysematous changes.     PT Comments    Session primarily concentrated on exercises. Pt able to perform all exercises actively in bed with instruction and cues. Bed mobility performed for better positioning. Max assist required for rolling. +2 total assist for scooting up in bed.    Follow Up Recommendations  SNF;Supervision/Assistance - 24 hour     Equipment Recommendations  None recommended by PT    Recommendations for Other Services       Precautions / Restrictions Precautions Precautions: Fall;Other (comment) Precaution Comments: O2 dep    Mobility  Bed Mobility Overal bed mobility: Needs Assistance Bed Mobility: Rolling Rolling: Max assist            Transfers                    Ambulation/Gait                 Stairs             Wheelchair Mobility    Modified Rankin (Stroke Patients Only)       Balance                                            Cognition Arousal/Alertness: Awake/alert Behavior During Therapy: WFL for tasks assessed/performed Overall Cognitive Status: Within Functional Limits for tasks assessed                                        Exercises General Exercises - Upper Extremity Elbow Flexion: AROM;10 reps;Both General Exercises - Lower Extremity Ankle Circles/Pumps: AROM;Both;10 reps;Supine Quad Sets: AROM;Both;10 reps;Supine Gluteal Sets: AROM;Both;10  reps;Supine Heel Slides: AROM;Right;Left;Supine;10 reps Hip ABduction/ADduction: AROM;Left;Right;10 reps;Supine    General Comments        Pertinent Vitals/Pain Pain Assessment: Faces Faces Pain Scale: Hurts little more Pain Location: back Pain Descriptors / Indicators: Sore Pain Intervention(s): Limited activity within patient's tolerance;Monitored during session;Repositioned    Home Living                      Prior Function            PT Goals (current goals can now be found in the care plan section) Acute Rehab PT Goals Patient Stated Goal: not stated PT Goal Formulation: With patient Time For Goal Achievement: 11/28/17 Potential to Achieve Goals: Good Progress towards PT goals: Progressing toward goals    Frequency    Min 2X/week      PT Plan Current plan remains appropriate    Co-evaluation              AM-PAC PT "6 Clicks" Daily Activity  Outcome Measure  Difficulty turning over in bed (including adjusting bedclothes, sheets and blankets)?: Unable Difficulty moving from lying on back to sitting on the  side of the bed? : Unable Difficulty sitting down on and standing up from a chair with arms (e.g., wheelchair, bedside commode, etc,.)?: Unable Help needed moving to and from a bed to chair (including a wheelchair)?: Total Help needed walking in hospital room?: Total Help needed climbing 3-5 steps with a railing? : Total 6 Click Score: 6    End of Session Equipment Utilized During Treatment: Gait belt;Oxygen Activity Tolerance: Patient tolerated treatment well Patient left: with call bell/phone within reach;in bed;with bed alarm set Nurse Communication: Mobility status PT Visit Diagnosis: Muscle weakness (generalized) (M62.81);Difficulty in walking, not elsewhere classified (R26.2);Unsteadiness on feet (R26.81);Other abnormalities of gait and mobility (R26.89)     Time: 3790-2409 PT Time Calculation (min) (ACUTE ONLY): 24  min  Charges:  $Therapeutic Exercise: 23-37 mins                    G Codes:       Lorrin Goodell, PT  Office # [phone] Pager [phone]    Lorriane Shire 11/23/2017, 12:05 PM

## 2017-11-23 NOTE — Progress Notes (Signed)
CSW received handoff from previous CSW. CSW to continue to follow for discharge back to [facility].   Percell Locus Tsion Inghram LCSW [phone]

## 2017-11-23 NOTE — Care Management Important Message (Signed)
Important Message  Patient Details  Name: Richard Rosales MRN: [id number] Date of Birth: [birth year]   Medicare Important Message Given:  Yes    Orbie Pyo 11/23/2017, 3:42 PM

## 2017-11-23 NOTE — Progress Notes (Signed)
Patient has small periods of Vtach with a HR in 120s and BP 110/50. Patient asymptomatic. MD made aware. Will continue to monitor.

## 2017-11-23 NOTE — Progress Notes (Signed)
PROGRESS NOTE  Richard Rosales  RKY:[id number] DOB: Jul 13, 1930 DOA: 11/13/2017 PCP: Unk Pinto, MD   Brief Narrative: Richard Rosales is an 84 y.o. male with a history of chronic HFrEF, AFib not on anticoagulation due to prior diverticular bleeding, chronic respiratory failure due to COPD, hypothyroidism who presented to the ED 5/24 from SNF due to syncope, found to be orthostatic. He continues to have lower extremity weakness due to polyneuropathy and previously diagnosed spinal stenosis. Neurology was consulted, recommending MRI and follow up with neurology and neurosurgery as outpatient. Unfortuantely pain precluded full spinal MRI and the patient declines reattempt. He has also made it clear that he wants to surgical management regardless of findings. Anticoagulation was started for DVT prophylaxis and the patient developed GI bleeding requiring transfusions, 6u total over the course of several days. Tagged RBC scan was positive though unfortunately, urgent mesenteric angiography was subsequently negative. His bleeding has subsided with the holding of blood thinners and observation.   Assessment & Plan: Principal Problem:   Syncope Active Problems:   OSA and COPD overlap syndrome (HCC)   Essential hypertension   COPD (chronic obstructive pulmonary disease) (HCC)   Hypothyroidism   A-fib (HCC)   Acute on chronic systolic CHF (congestive heart failure) (HCC)   Elevated troponin   UTI (urinary tract infection)   Pressure injury of skin   Orthostatic hypotension   GI bleed   Acute blood loss anemia   Abnormal x-ray of abdomen  Acute diverticular bleeding: tagged RBC scan 5/29 negative, repeated 5/31 with +bleeding at splenic flexure not seen on urgent angio, therefore no embo.  - IF PT DEVELOPS GI BLEEDING, PROCEED WITH IR NOTIFICATION FOR URGENT ANGIO.  - GI, IR consulted  Acute blood loss anemia: Due to GI bleed above - Monitor CBC. Received 2u PRBCs most recently 6/2 (6u total)  with improvement in hgb 8 > 9.6. Will monitor symptoms closely.   Generalized and lower extremity weakness: Multifactorial including deconditioning, lower extremity weakness/polyneuropathy and spinal stenosis. MRI brain notable for atrophy and white matter changes moderately advanced for age. Could not complete MRI of the spine, was only able to complete cervical MRI which revealed stenosis at C3/4.  - Neurology consulted, recommended MRI with concern for higher lesions. Pt is consistently declining attempted repeat MRI due to back pain in scanner.  - Per notes, he has been deemed not a surgical candidate, though patient is considering no further aggressive medical work up or surgeries.   Syncope: Suspected to be due to orthostatic hypotension and autonomic dysfunction from polyneuropathy.  - Continue telemetry. Having frequent PVCs.  - Initially held BP medications, but restarted as below.  - Compression stockings.   PAF:  - Not on anticoagulation in light of GI bleeding, splitting the difference with aspirin MWF.  -Continue metoprolol, will increase dose due to elevated rates.  - Note telemetry read as VTach though this is AFib with aberrancy and frequent PVCs. Telemetry informally reviewed today with Dr. Sallyanne Kuster.   Chronic HFrEF: Echo done earlier this month with EF 40-45%, diffuse HK, severe LAE, mild AI, mild AS, and mild pulm HTN - Daily weights (wt stable), strict I/O - Lasix being given prn - Coreg changed to metoprolol with hopes to spare BP. Consider consolidating to succinate due to LV dysfunction.  Chronic hypoxic respiratory failure due to COPD: No evidence of acute exacerbation.  - Continue supplemental oxygen, 2 LPM. - Continue ICS/LABA, spiriva, prn albuterol  Hypothyroidism: Recent TSH 1.951.  -  Continue synthroid stable dose  Troponin elevation: 0.11 in ED, down from previous without chest pain or anginal equivalent.  - Follow up with cardiology as outpatient,  consider further evaluation if he develops chest pain.   Hypokalemia: Mild, resolved. - Monitor closely. Replacing 39mEq daily.  Stage 1 decubitus ulcer:  - Continue foam padding, offloading.  Full thickness wound to R great toe:  - Will need to follow up with ortho.  Seen by wound care.  LE edema: R>L is chronic, but appeared worse yesterday per daughter.  [place] (without DVT - cystic structure noted in upper calf medially on left). Restart lasix.  DVT prophylaxis: SCDs Code Status: DNR Family Communication: Wife by phone for 22 minutes this evening. Disposition Plan: Pending further monitoring, if no further bleeding, hgb stable, anticipate discharge 6/4. Pt needs palliative care discussions.   Consultants:   GI, IR, neurology, palliative care team  Procedures:  5/31: 3 vessel mesenteric angiogram: negative for acute bleeding   Subjective: Pt with severe pain all over, arthritis-related pain in feet, knees, hips, back, shoulders, elbows, and hands. Denies pain in chest, dyspnea, palpitations or bleeding today.   Objective: Vitals:   11/23/17 0734 11/23/17 0827 11/23/17 0926 11/23/17 1208  BP: 97/71   (!) 101/54  Pulse:    (!) 50  Resp: (!) 23   15  Temp: 98.1 F (36.7 C)   98.5 F (36.9 C)  TempSrc: Oral   Oral  SpO2: 94% 96% 96% 98%  Weight:      Height:        Intake/Output Summary (Last 24 hours) at 11/23/2017 1558 Last data filed at 11/23/2017 0956 Gross per 24 hour  Intake 1070 ml  Output 500 ml  Net 570 ml   Filed Weights   11/20/17 0512 11/21/17 0149 11/23/17 0637  Weight: 104.2 kg (229 lb 11.5 oz) 101.8 kg (224 lb 6.9 oz) 103 kg (227 lb)    Gen: Pleasant, elderly male in no distress  Pulm: Non-labored breathing supplemental oxygen. Clear but diminished to auscultation bilaterally.  CV: Irreg irreg w/rate in 110's. No murmur, rub, or gallop. No JVD, trace pedal edema. GI: Abdomen soft, non-tender, non-distended, with normoactive bowel sounds. No  organomegaly or masses felt. Ext: Warm, no deformities Skin: No rashes, lesions no ulcers Neuro: Alert and oriented. Diffusely weak in proximal > distal muscle groups of LE's. Psych: Judgement and insight appear normal. Mood & affect appropriate.   Data Reviewed: I have personally reviewed following labs and imaging studies  CBC: Recent Labs  Lab 11/20/17 0722  11/20/17 1511 11/21/17 0243 11/21/17 1452 11/22/17 0927 11/22/17 1552 11/23/17 0543  WBC 9.3  --  11.3* 8.7 8.4 7.6  --  6.4  NEUTROABS 5.9  --   --  6.2  --   --   --   --   HGB 7.8*   < > 7.5* 7.9* 9.8* 8.4* 8.0* 9.6*  HCT 23.0*   < > 23.0* 24.1* 29.3* 25.4* 23.6* 29.1*  MCV 90.6  --  92.4 89.3 86.7 88.5  --  89.5  PLT 235  --  280 245 259 279  --  222   < > = values in this interval not displayed.   Basic Metabolic Panel: Recent Labs  Lab 11/17/17 0609 11/18/17 0752 11/22/17 0627 11/22/17 0927 11/23/17 0543  NA 135 133* 134* 134* 136  K 4.4 4.5 4.2 3.5 3.3*  CL 95* 95* 100* 98* 98*  CO2 33* 28 24 28  30  GLUCOSE 92 89 99 138* 97  BUN 8 13 28* 28* 22*  CREATININE 0.73 0.77 0.86 0.98 0.75  CALCIUM 8.7* 8.2* 7.7* 7.8* 8.0*  MG 2.1 1.9  --   --   --    GFR: Estimated Creatinine Clearance: 79.8 mL/min (by C-G formula based on SCr of 0.75 mg/dL). Liver Function Tests: No results for input(s): AST, ALT, ALKPHOS, BILITOT, PROT, ALBUMIN in the last 168 hours. No results for input(s): LIPASE, AMYLASE in the last 168 hours. No results for input(s): AMMONIA in the last 168 hours. Coagulation Profile: Recent Labs  Lab 11/19/17 0500  INR 1.15   Cardiac Enzymes: No results for input(s): CKTOTAL, CKMB, CKMBINDEX, TROPONINI in the last 168 hours. BNP (last 3 results) Recent Labs    04/07/17 1632  PROBNP 230.0*   HbA1C: No results for input(s): HGBA1C in the last 72 hours. CBG: Recent Labs  Lab 11/18/17 0651 11/19/17 0722 11/20/17 0757 11/22/17 0808 11/23/17 0743  GLUCAP 86 97 107* 99 81   Lipid  Profile: No results for input(s): CHOL, HDL, LDLCALC, TRIG, CHOLHDL, LDLDIRECT in the last 72 hours. Thyroid Function Tests: No results for input(s): TSH, T4TOTAL, FREET4, T3FREE, THYROIDAB in the last 72 hours. Anemia Panel: No results for input(s): VITAMINB12, FOLATE, FERRITIN, TIBC, IRON, RETICCTPCT in the last 72 hours. Urine analysis:    Component Value Date/Time   COLORURINE YELLOW 11/13/2017 1530   APPEARANCEUR HAZY (A) 11/13/2017 1530   LABSPEC 1.013 11/13/2017 1530   PHURINE 5.0 11/13/2017 1530   GLUCOSEU NEGATIVE 11/13/2017 [place] 11/13/2017 [place] 11/13/2017 [place] 11/13/2017 1530   PROTEINUR NEGATIVE 11/13/2017 1530   UROBILINOGEN 1.0 11/11/2013 0125   NITRITE NEGATIVE 11/13/2017 1530   LEUKOCYTESUR NEGATIVE 11/13/2017 1530   No results found for this or any previous visit (from the past 240 hour(s)).    Radiology Studies: No results found.  Scheduled Meds: . aspirin EC  81 mg Oral Q M,W,F  . azelastine  1 spray Each Nare BID  . B-complex with vitamin C  1 tablet Oral Daily  . cholecalciferol  4,000 Units Oral Daily  . levothyroxine  100 mcg Oral QAC breakfast  . mouth rinse  15 mL Mouth Rinse BID  . metoprolol tartrate  12.5 mg Oral BID  . mometasone-formoterol  2 puff Inhalation BID  . multivitamin with minerals  1 tablet Oral Daily  . neomycin-bacitracin-polymyxin   Topical Daily  . pantoprazole  40 mg Oral Q0600  . potassium chloride  10 mEq Oral Daily  . sodium chloride flush  3 mL Intravenous Q12H  . tiotropium  1 capsule Inhalation Daily   Continuous Infusions: . sodium chloride Stopped (11/21/17 1113)     LOS: 10 days   Time spent: 25 minutes.  Patrecia Pour, MD [facility] [email or web address] Password [facility] 11/23/2017, 3:58 PM

## 2017-11-23 NOTE — Progress Notes (Signed)
Patient has home CPAP unit at bedside. RT filled water chamber with sterile water and placed on patient. Patient tolerating well at this time.

## 2017-11-23 NOTE — Consult Note (Signed)
Met at bedside with Richard Rosales. His wife had visited for a short time earlier.  Patient had asked for me to be paged but I had not received any notification.  Bedside evaluation continued as Richard Rosales is alert and oriented.  He is able to recall the events of being discharged to SNF and returning to [facility] after a day due to heart issues.  Richard Rosales appears weak and stated that he was unable to get OOB with PT today but that he had done exercise in bed.  We discussed my role as palliative care consult, he was able understand and return information about his goals of care.  Goals as he states are to continue with PT at the SNF in order to be able to be strong enough to get around his house.  He states that his daughter and her family very recently have moved into the downstairs of the home and would be able to provide some help with care once he was at home although limited due to work schedules.  He reports another daughter and a son here in [place] who are very involved in family life with he and his wife.  Richard Rosales does have HCPOA and Living will documents although his wife would need to bring them.  He reports his understanding of DNR but wants to coitinue full scope of his treatment plan at this point.  We discussed options to limit certain aggressive treatments should they become necessary for life sustaining but he was not ready to fully explore those at this time.    Of note:  At 8pm I spoke by phone with his wife Richard Rosales.  She was able to give more detailed history since January of this year with Richard Rosales. 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  I discussed fully with Richard Rosales the information relayed to the patient as above.  She agrees that planning for "what if" would be benefit both she and the family.  I explained my role as palliative consult.    Plan:  I believe that Mr.  Rosales would benefit from further discussions of scope of treatment and be well served to complete a [place] MOST form with the provider at [facility].  I have committed to follow up conversation with Richard Rosales on Thursday to hopefully continue discussions about goals and offer support in planning with Richard Rosales the possible trajectories of his advanced illness and co-morbidities.   Ellen , MSN, RN-BC Palliative Clinical Specialist [place]     

## 2017-11-24 DIAGNOSIS — R1314 Dysphagia, pharyngoesophageal phase: Secondary | ICD-10-CM | POA: Diagnosis not present

## 2017-11-24 DIAGNOSIS — R41841 Cognitive communication deficit: Secondary | ICD-10-CM | POA: Diagnosis not present

## 2017-11-24 DIAGNOSIS — E038 Other specified hypothyroidism: Secondary | ICD-10-CM | POA: Diagnosis not present

## 2017-11-24 DIAGNOSIS — J449 Chronic obstructive pulmonary disease, unspecified: Secondary | ICD-10-CM | POA: Diagnosis not present

## 2017-11-24 DIAGNOSIS — E871 Hypo-osmolality and hyponatremia: Secondary | ICD-10-CM | POA: Diagnosis not present

## 2017-11-24 DIAGNOSIS — R5381 Other malaise: Secondary | ICD-10-CM | POA: Diagnosis not present

## 2017-11-24 DIAGNOSIS — I509 Heart failure, unspecified: Secondary | ICD-10-CM | POA: Diagnosis not present

## 2017-11-24 DIAGNOSIS — I480 Paroxysmal atrial fibrillation: Secondary | ICD-10-CM | POA: Diagnosis not present

## 2017-11-24 DIAGNOSIS — M545 Low back pain: Secondary | ICD-10-CM | POA: Diagnosis not present

## 2017-11-24 DIAGNOSIS — E785 Hyperlipidemia, unspecified: Secondary | ICD-10-CM | POA: Diagnosis not present

## 2017-11-24 DIAGNOSIS — R2689 Other abnormalities of gait and mobility: Secondary | ICD-10-CM | POA: Diagnosis not present

## 2017-11-24 DIAGNOSIS — R748 Abnormal levels of other serum enzymes: Secondary | ICD-10-CM | POA: Diagnosis not present

## 2017-11-24 DIAGNOSIS — J301 Allergic rhinitis due to pollen: Secondary | ICD-10-CM | POA: Diagnosis not present

## 2017-11-24 DIAGNOSIS — R0902 Hypoxemia: Secondary | ICD-10-CM | POA: Diagnosis not present

## 2017-11-24 DIAGNOSIS — R630 Anorexia: Secondary | ICD-10-CM | POA: Diagnosis not present

## 2017-11-24 DIAGNOSIS — I5032 Chronic diastolic (congestive) heart failure: Secondary | ICD-10-CM | POA: Diagnosis not present

## 2017-11-24 DIAGNOSIS — I951 Orthostatic hypotension: Secondary | ICD-10-CM | POA: Diagnosis not present

## 2017-11-24 DIAGNOSIS — K5781 Diverticulitis of intestine, part unspecified, with perforation and abscess with bleeding: Secondary | ICD-10-CM | POA: Diagnosis not present

## 2017-11-24 DIAGNOSIS — K219 Gastro-esophageal reflux disease without esophagitis: Secondary | ICD-10-CM | POA: Diagnosis not present

## 2017-11-24 DIAGNOSIS — M059 Rheumatoid arthritis with rheumatoid factor, unspecified: Secondary | ICD-10-CM | POA: Diagnosis not present

## 2017-11-24 DIAGNOSIS — E039 Hypothyroidism, unspecified: Secondary | ICD-10-CM | POA: Diagnosis not present

## 2017-11-24 DIAGNOSIS — I10 Essential (primary) hypertension: Secondary | ICD-10-CM | POA: Diagnosis not present

## 2017-11-24 DIAGNOSIS — R278 Other lack of coordination: Secondary | ICD-10-CM | POA: Diagnosis not present

## 2017-11-24 DIAGNOSIS — Z7401 Bed confinement status: Secondary | ICD-10-CM | POA: Diagnosis not present

## 2017-11-24 DIAGNOSIS — R55 Syncope and collapse: Secondary | ICD-10-CM | POA: Diagnosis not present

## 2017-11-24 DIAGNOSIS — I872 Venous insufficiency (chronic) (peripheral): Secondary | ICD-10-CM | POA: Diagnosis not present

## 2017-11-24 DIAGNOSIS — F419 Anxiety disorder, unspecified: Secondary | ICD-10-CM | POA: Diagnosis not present

## 2017-11-24 DIAGNOSIS — G4733 Obstructive sleep apnea (adult) (pediatric): Secondary | ICD-10-CM | POA: Diagnosis not present

## 2017-11-24 DIAGNOSIS — I5023 Acute on chronic systolic (congestive) heart failure: Secondary | ICD-10-CM | POA: Diagnosis not present

## 2017-11-24 DIAGNOSIS — K922 Gastrointestinal hemorrhage, unspecified: Secondary | ICD-10-CM | POA: Diagnosis not present

## 2017-11-24 DIAGNOSIS — D62 Acute posthemorrhagic anemia: Secondary | ICD-10-CM | POA: Diagnosis not present

## 2017-11-24 DIAGNOSIS — I482 Chronic atrial fibrillation: Secondary | ICD-10-CM | POA: Diagnosis not present

## 2017-11-24 DIAGNOSIS — Z741 Need for assistance with personal care: Secondary | ICD-10-CM | POA: Diagnosis not present

## 2017-11-24 DIAGNOSIS — I4891 Unspecified atrial fibrillation: Secondary | ICD-10-CM | POA: Diagnosis not present

## 2017-11-24 DIAGNOSIS — Z515 Encounter for palliative care: Secondary | ICD-10-CM | POA: Diagnosis not present

## 2017-11-24 DIAGNOSIS — I2510 Atherosclerotic heart disease of native coronary artery without angina pectoris: Secondary | ICD-10-CM | POA: Diagnosis not present

## 2017-11-24 DIAGNOSIS — E876 Hypokalemia: Secondary | ICD-10-CM | POA: Diagnosis not present

## 2017-11-24 DIAGNOSIS — R2681 Unsteadiness on feet: Secondary | ICD-10-CM | POA: Diagnosis not present

## 2017-11-24 DIAGNOSIS — I679 Cerebrovascular disease, unspecified: Secondary | ICD-10-CM | POA: Diagnosis not present

## 2017-11-24 DIAGNOSIS — K625 Hemorrhage of anus and rectum: Secondary | ICD-10-CM | POA: Diagnosis not present

## 2017-11-24 DIAGNOSIS — M2550 Pain in unspecified joint: Secondary | ICD-10-CM | POA: Diagnosis not present

## 2017-11-24 DIAGNOSIS — I499 Cardiac arrhythmia, unspecified: Secondary | ICD-10-CM | POA: Diagnosis not present

## 2017-11-24 LAB — TYPE AND SCREEN
ABO/RH(D): O POS
Antibody Screen: NEGATIVE
UNIT DIVISION: 0
UNIT DIVISION: 0
UNIT DIVISION: 0
UNIT DIVISION: 0
Unit division: 0
Unit division: 0

## 2017-11-24 LAB — BPAM RBC
BLOOD PRODUCT EXPIRATION DATE: 201906292359
BLOOD PRODUCT EXPIRATION DATE: 201906292359
Blood Product Expiration Date: 201906292359
Blood Product Expiration Date: 201906292359
Blood Product Expiration Date: 201907052359
Blood Product Expiration Date: 201907052359
ISSUE DATE / TIME: 201905312016
ISSUE DATE / TIME: 201906010305
ISSUE DATE / TIME: 201906010913
ISSUE DATE / TIME: 201906022135
ISSUE DATE / TIME: 201906030130
ISSUE DATE / TIME: 201905311643
UNIT TYPE AND RH: 5100
UNIT TYPE AND RH: 5100
UNIT TYPE AND RH: 5100
Unit Type and Rh: 5100
Unit Type and Rh: 5100
Unit Type and Rh: 5100

## 2017-11-24 LAB — CBC
HCT: 27.8 % — ABNORMAL LOW (ref 39.0–52.0)
HEMOGLOBIN: 9.2 g/dL — AB (ref 13.0–17.0)
MCH: 29.7 pg (ref 26.0–34.0)
MCHC: 33.1 g/dL (ref 30.0–36.0)
MCV: 89.7 fL (ref 78.0–100.0)
Platelets: 233 10*3/uL (ref 150–400)
RBC: 3.1 MIL/uL — AB (ref 4.22–5.81)
RDW: 15.9 % — ABNORMAL HIGH (ref 11.5–15.5)
WBC: 6.6 10*3/uL (ref 4.0–10.5)

## 2017-11-24 LAB — GLUCOSE, CAPILLARY: GLUCOSE-CAPILLARY: 107 mg/dL — AB (ref 65–99)

## 2017-11-24 MED ORDER — HYDROCODONE-ACETAMINOPHEN 5-325 MG PO TABS: 1.0000 | ORAL_TABLET | ORAL | 0 refills | Status: AC | PRN

## 2017-11-24 MED ORDER — METOPROLOL SUCCINATE ER 25 MG PO TB24: 25.0000 mg | ORAL_TABLET | Freq: Every day | ORAL | Status: DC

## 2017-11-24 MED ORDER — FUROSEMIDE 40 MG PO TABS: 40.0000 mg | ORAL_TABLET | Freq: Every day | ORAL | Status: AC

## 2017-11-24 NOTE — Progress Notes (Signed)
Patient will discharge to [facility] Anticipated discharge date: 6/4 Family notified: pt notified family Transportation by PTAR- called at 10:20am Report #: [phone] Rm [id number]  [place] signing off.  Jorge Ny, LCSW Clinical Social Worker [phone]

## 2017-11-24 NOTE — Progress Notes (Signed)
Patient was discharged to nursing home ( [facility]) by MD order; discharged instructions  review and sent to facility with care notes via PTAR; IV DIC;  patient will be transported to facility via [facility]. Facility was called and report was given to the nurse who is going to receive the patient. Family made aware.

## 2017-11-24 NOTE — Discharge Summary (Signed)
Physician Discharge Summary  Richard Rosales NID:[id number] DOB: 1931-06-13 DOA: 11/13/2017  PCP: Unk Pinto, MD  Admit date: 11/13/2017 Discharge date: 11/24/2017  Admitted From: SNF Disposition: SNF   Recommendations for Outpatient Follow-up:  1. Follow up with PCP in 1-2 weeks 2. Monitor CBC and BMP closely. 3. Continue scope of treatment/goals of care discussions. Would recommend completion of [place] MOST form at [facility].  4. Continue PT for lower extremity weakness, likely in part due to spinal stenosis for which he is not a surgical candidate.  5. Follow up with neurology, referral made to Richard Rosales, for polyneuropathy. 6. Recommend monitoring orthostatic vital signs and adaptive techniques with PT with this in mind.  7. For AFib w/aberrancy, chronic CHF, and mild troponin elevation, recommend follow up with cardiology as outpatient, consider further evaluation if he develops chest pain.  8. Lasix to be restarted, appears euvolemic so decreased to once daily. Will need to be followed closely  Home Health: N/A Equipment/Devices: Per SNF Discharge Condition: Stable CODE STATUS: DNR Diet recommendation: Heart healthy  Brief/Interim Summary: Richard Rosales is an 84 y.o. male with a history of chronic HFrEF, AFib not on anticoagulation due to prior diverticular bleeding, chronic respiratory failure due to COPD, hypothyroidism who presented to the ED 5/24 from SNF due to syncope, found to be orthostatic. He continues to have lower extremity weakness due to polyneuropathy and previously diagnosed spinal stenosis. Neurology was consulted, recommending MRI and follow up with neurology and neurosurgery as outpatient. Unfortuantely pain precluded full spinal MRI and the patient declines reattempt. He has also made it clear that he wants to surgical management regardless of findings. Anticoagulation was started for DVT prophylaxis and the patient developed GI bleeding requiring transfusions, 6u  total over the course of several days. Tagged RBC scan was initially negative but became positive on repeat though unfortunately, urgent mesenteric angiography was subsequently negative. GI was consulted and followed along with no interventions planned. His bleeding has subsided with the holding of blood thinners and observation and hemoglobin is stable, 9.2 g/dl at discharge.    Discharge Diagnoses:  Principal Problem:   Syncope Active Problems:   OSA and COPD overlap syndrome (HCC)   Essential hypertension   COPD (chronic obstructive pulmonary disease) (HCC)   Hypothyroidism   A-fib (HCC)   Acute on chronic systolic CHF (congestive heart failure) (HCC)   Elevated troponin   UTI (urinary tract infection)   Pressure injury of skin   Orthostatic hypotension   GI bleed   Acute blood loss anemia   Abnormal x-ray of abdomen  Acute diverticular bleeding: tagged RBC scan 5/29 negative, repeated 5/31 with +bleeding at splenic flexure not seen on urgent angio, therefore no embo.  - Seems to have resolved. No BM in past 24 hours and hgb stable >9.  - GI, IR consulted while inpatient. Can follow up prn.  Acute blood loss anemia: Due to GI bleed above which has resolved. - Monitor CBC. Received 2u PRBCs most recently 6/2 (6u total) with improvement in hgb 8 >> 9.2.  - Would strongly discourage initiation of anticoagulation  Generalized and lower extremity weakness: Multifactorial including deconditioning, lower extremity weakness/polyneuropathy and spinal stenosis. MRI brain notable for atrophy and white matter changes moderately advanced for age. Could not complete MRI of the spine,was only able to complete cervical MRI which revealed stenosis at C3/4.  - Neurology consulted, recommended MRI as above. Pt is consistently declining attempted repeat MRI due to back pain in scanner.  -  Per notes, he has been deemed not a surgical candidate, though patient is, himself, not willing to undergo any  surgery. Therefore, no surgical consultation was requested. - Physical therapy and pain control (continue norco prn) will be cornerstones of treatment  Syncope: Pt actually denies that this occurred prior to arrival (says symptoms occurring w/PT were just leg weakness, not loss of global postural tone and no LOC), though it is documented as such and had +orthostic vital signs at admission. Suspected to be due to orthostatic hypotension and autonomic dysfunction from polyneuropathy.  - No arrhythmias that would compromise cerebral perfusion noted on telemetry. Having frequent PVCs.  - Initially held BP medications, but restarted as below.  - Compression stockings also recommended.   PAF:  - Not on anticoagulation in light of GI bleeding, after discussion with family, we have decided on splitting the difference with aspirin MWF.  -Continue metoprolol (note documented allergy to beta blockers, pt has tolerated this without issues). - Note telemetry read as VTach though this is AFib with aberrancy and frequent PVCs. Telemetry was reviewed with cardiology, Dr. Sallyanne Kuster.   Chronic HFrEF: Echo done earlier this month with EF 40-45%, diffuse HK, severe LAE, mild AI, mild AS, and mild pulm HTN - Daily weights (wt stable at 232lbs at discharge, having hovered between 224-236lbs) - Lasix to be restarted, appears euvolemic so decreased to once daily. Will need to be followed closely. - Note ACE allergy, no ACE or ARB challenge while admitted. - Coreg changed to metoprolol with hopes to spare BP. Will consolidate to succinate due to LV dysfunction.  Chronic hypoxic respiratory failure due to COPD: No evidence of acute exacerbation.  - Continue supplemental oxygen, 2 LPM. - Continue ICS/LABA, spiriva, prn albuterol  Hypothyroidism: Recent TSH 1.951.  - Continue synthroid stable dose  Troponin elevation: 0.11 in ED, down from previous without chest pain or anginal equivalent.  - Follow up with  cardiology as outpatient, consider further evaluation if he develops chest pain.   Hypokalemia: Mild, resolved. - Monitor closely. Replacing 55mEq daily.  Stage 1 decubitus ulcer:  - Continue foam padding, offloading.  Full thickness wound to R great toe: - Will need to follow up with ortho depending on progress. Seen by wound care.  LE edema: R>L is chronic and improved at discharge. [place] (without DVT - cystic structure noted in upper calf medially on left). Restart lasix.  UTI: Completed omnicef while admitted  Discharge Instructions Discharge Instructions    ([place]) Call MD:  Anytime you have any of the following symptoms: 1) 3 pound weight gain in 24 hours or 5 pounds in 1 week 2) shortness of breath, with or without a dry hacking cough 3) swelling in the hands, feet or stomach 4) if you have to sleep on extra pillows at night in order to breathe.   Complete by:  As directed    Ambulatory referral to Neurology   Complete by:  As directed    An appointment is requested in approximately: 2 weeks for gait instability and LE weakness. Was a Dr Felecia Shelling patient but was fred due to "appointment confusion"   Diet - low sodium heart healthy   Complete by:  As directed    Increase activity slowly   Complete by:  As directed      Allergies as of 11/24/2017      Reactions   Other Other (See Comments)   No blood thinners; they caused him to [place]  Ace Inhibitors Cough   Beta Adrenergic Blockers Other (See Comments)   Bradycardia   Decadron [dexamethasone] Other (See Comments)   Becomes very "mean" if taking this   Fluticasone-salmeterol Other (See Comments)   Doesn't remember the reaction   Hytrin [terazosin] Other (See Comments)   Nasal congestion   Prednisone    Shakes / tremors   Vioxx [rofecoxib] Other (See Comments)   Dyspepsia      Medication List    STOP taking these medications   carvedilol 6.25 MG tablet Commonly known as:  COREG    cefdinir 300 MG capsule Commonly known as:  [name]     TAKE these medications   albuterol 108 (90 Base) MCG/ACT inhaler Commonly known as:  PROVENTIL HFA;VENTOLIN HFA Inhale 2 puffs into the lungs every 6 (six) hours as needed for wheezing or shortness of breath.   albuterol (2.5 MG/3ML) 0.083% nebulizer solution Commonly known as:  PROVENTIL Take 3 mLs (2.5 mg total) by nebulization every 4 (four) hours as needed for wheezing or shortness of breath.   ammonium lactate 12 % lotion Commonly known as:  LAC-HYDRIN Apply topically every morning.   aspirin EC 81 MG tablet Take 81 mg by mouth every Monday, Wednesday, and Friday.   azelastine 0.1 % nasal spray Commonly known as:  ASTELIN USE 1 SPRAY IN EACH NOSTRIL TWICE A DAY   b complex vitamins capsule Take 1 capsule by mouth daily.   calcium carbonate 500 MG chewable tablet Commonly known as:  TUMS - dosed in mg elemental calcium Chew 2 tablets by mouth 3 (three) times daily as needed for indigestion.   docusate sodium 100 MG capsule Commonly known as:  COLACE Take 100 mg by mouth 2 (two) times daily.   DULCOLAX 10 MG suppository Generic drug:  bisacodyl Place 10 mg rectally once as needed (if no BM in 8 hours after MOM administered).   Fish Oil 1000 MG Caps Take 1,000 mg by mouth 2 (two) times daily.   FLEET ENEMA RE Place 1 each rectally once as needed (if no BM within 2 hours after dulcolax suppository).   furosemide 40 MG tablet Commonly known as:  LASIX Take 1 tablet (40 mg total) by mouth daily. What changed:  when to take this   GLUCOSAMINE-CHONDROITIN PO Take 1 capsule by mouth daily.   HYDROcodone-acetaminophen 5-325 MG tablet Commonly known as:  NORCO/VICODIN Take 1-2 tablets by mouth every 4 (four) hours as needed for moderate pain or severe pain.   levothyroxine 100 MCG tablet Commonly known as:  SYNTHROID, LEVOTHROID TAKE 1 TABLET DAILY BEFORE BREAKFAST   metoprolol succinate 25 MG 24 hr  tablet Commonly known as:  TOPROL XL Take 1 tablet (25 mg total) by mouth daily.   MILK OF MAGNESIA PO Take 30 mLs by mouth once as needed (if no BM in 3 days).   multivitamin with minerals Tabs tablet Take 1 tablet by mouth daily.   omeprazole 40 MG capsule Commonly known as:  PRILOSEC TAKE 1 CAPSULE TWICE A DAY What changed:    how much to take  how to take this  when to take this   OXYGEN Inhale 2 L into the lungs continuous.   potassium chloride 10 MEQ tablet Commonly known as:  K-DUR Take 1 tablet (10 mEq total) by mouth daily.   SPIRIVA HANDIHALER 18 MCG inhalation capsule Generic drug:  tiotropium INHALE THE CONTENTS OF 1 CAPSULE DAILY   SYMBICORT 160-4.5 MCG/ACT inhaler Generic drug:  budesonide-formoterol  USE 2 INHALATIONS TWICE A DAY   UNABLE TO FIND CPAP: At bedtime   Vitamin D 2000 units tablet Take 4,000 Units by mouth daily.       Contact information for follow-up providers    Thompson Grayer, MD Follow up.   Specialty:  Cardiology Contact information: [place] [address] [phone]        Unk Pinto, MD .   Specialty:  Internal Medicine Contact information: [address] [place] [address] [phone]        Britt Bottom, MD Follow up.   Specialty:  Neurology Contact information: [place] [address] [phone]            Contact information for after-discharge care    Destination    [facility] .   Service:  Skilled Nursing Contact information: [place] [phone]                 Allergies  Allergen Reactions  . Other Other (See Comments)    No blood thinners; they caused him to [place]  . Ace Inhibitors Cough  . Beta Adrenergic Blockers Other (See Comments)    Bradycardia   . Decadron [Dexamethasone] Other (See Comments)    Becomes very "mean"  if taking this  . Fluticasone-Salmeterol Other (See Comments)    Doesn't remember the reaction  . Hytrin [Terazosin] Other (See Comments)    Nasal congestion   . Prednisone     Shakes / tremors  . Vioxx [Rofecoxib] Other (See Comments)    Dyspepsia     Consultations:  GI  IR  Neurology  Informal cardiology consultation  Procedures/Studies: Dg Chest 2 View  Result Date: 10/25/2017 CLINICAL DATA:  84 year old male with history of weakness. EXAM: CHEST - 2 VIEW COMPARISON:  Chest x-ray 04/07/2017. FINDINGS: In volumes are low. No consolidative airspace disease. No pleural effusions. Cephalization of the pulmonary vasculature, without frank pulmonary edema. Enlargement of the cardiopericardial silhouette, which could reflect underlying cardiomegaly and/or the presence of a pericardial effusion. Upper mediastinal contours are within normal limits. Aortic atherosclerosis. IMPRESSION: 1. Enlarged cardiopericardial silhouette, which could reflect cardiomegaly and/or presence of pericardial effusion. If, further evaluation there is clinical concern for pericardial effusion with echocardiography is suggested. 2. Pulmonary venous congestion, without frank pulmonary edema. 3. Aortic atherosclerosis. Electronically Signed   By: Vinnie Langton [name]   On: 10/25/2017 13:17   Ct Head Wo Contrast  Result Date: 10/26/2017 CLINICAL DATA:  Leg weakness for 4 days. EXAM: CT HEAD WITHOUT CONTRAST TECHNIQUE: Contiguous axial images were obtained from the base of the skull through the vertex without intravenous contrast. COMPARISON:  11/11/2013 FINDINGS: Brain: Chronic moderate small vessel ischemic disease of periventricular white matter. Age appropriate involutional changes of the brain. No hydrocephalus. No effacement of the basal cisterns. Cerebellum and brainstem are stable and intact. No intra-axial mass nor extra-axial fluid collections. No large vascular territory infarct. Chronic small lacunar infarct  involving the anterior limb of the left internal capsule as before. Vascular: No hyperdense vessel or unexpected calcification. Skull: Negative for fracture or focal lesion. Sinuses/Orbits: Status post bilateral maxillary antrectomies. Minimal ethmoid and sphenoid sinus mucosal thickening. Intact orbits and globes. Other: None IMPRESSION: 1. Chronic microvascular ischemic disease and old left lacunar infarct of the anterior limb of the internal capsule. No acute intracranial abnormality. 2. Status post bilateral maxillary antrectomy. Electronically Signed   By:  Ashley Royalty [name]   On: 10/26/2017 19:53   Mr Brain Wo Contrast  Result Date: 11/15/2017 CLINICAL DATA:  Syncope, simple, abnormal neuro exam. Recent hospital admission for lethargy and urinary tract infection. Syncopal episode at skilled nursing facility. EXAM: MRI HEAD WITHOUT CONTRAST TECHNIQUE: Multiplanar, multiecho pulse sequences of the brain and surrounding structures were obtained without intravenous contrast. COMPARISON:  CT head without contrast 10/26/2017 FINDINGS: Brain: Mild generalized atrophy is present. Moderate diffuse periventricular and subcortical T2 hyperintensities are present bilaterally. Ventricles are proportionate to the degree of atrophy. No significant extra-axial fluid collection is present. The internal auditory canals are within normal limits bilaterally. The brainstem and cerebellum are normal. Vascular: Flow is present in the major intracranial arteries. Skull and upper cervical spine: Skull base is within normal limits. Exaggerated cervical lordosis is present. There is focal central canal stenosis at C3-4 with grade 1 anterolisthesis. Midline sagittal structures are otherwise unremarkable. Sinuses/Orbits: Bilateral maxillary antrostomies are present. No significant residual recurrent sinus disease is present. Small polyps are present within the sphenoid sinuses bilaterally. The mastoid air cells are clear. Globes and  orbits are within normal limits. IMPRESSION: 1. No acute intracranial abnormality. 2. Atrophy and white matter changes are moderately advanced for age. The finding is nonspecific but can be seen in the setting of chronic microvascular ischemia, a demyelinating process such as multiple sclerosis, vasculitis, complicated migraine headaches, or as the sequelae of a prior infectious or inflammatory process. 3. Moderate central canal stenosis at C3-4. 4. Previous sinus surgery. Electronically Signed   By: San Morelle [name]   On: 11/15/2017 11:19   Mr Cervical Spine Wo Contrast  Result Date: 11/18/2017 CLINICAL DATA:  Cervical spinal stenosis. EXAM: MRI CERVICAL SPINE WITHOUT CONTRAST TECHNIQUE: Multiplanar, multisequence MR imaging of the cervical spine was performed. No intravenous contrast was administered. COMPARISON:  Radiography 09/18/2016.  Previous MRI 2005. FINDINGS: Patient would not tolerate axial imaging. Only sagittal sequences were obtained. The patient would not tolerate thoracic spine imaging as was ordered. Alignment: 6 mm anterolisthesis C3-4. Vertebrae: Distant fusion at C4-5. Cord: Some cord deformity at the C3-4 level. AP diameter of the canal only approximately 6 mm. Posterior Fossa, vertebral arteries, paraspinal tissues: Negative Disc levels: Foramen magnum sufficiently patent. Ordinary osteoarthritis at C1-2 without stenosis. C2-3 shows facet arthropathy on the left but no central canal stenosis. C3-4 shows central canal stenosis because of facet arthropathy and 6 mm of anterolisthesis. As noted above, the cord could be partially compressed at this level though there is no visible abnormal T2 signal. C4-5 shows distant fusion with sufficient patency of the canal and foramina. C5-6 shows spondylosis with osteophytes that narrow the ventral subarachnoid space but do not compress the cord. C6-7 shows small osteophytes but no compressive stenosis. C7-T1 is normal. Upper thoracic region is  unremarkable. IMPRESSION: Spinal stenosis at C3-4. Advanced facet arthropathy with anterolisthesis of 6 mm. The cord may be compressed or partially compressed at this level. See above discussion. Electronically Signed   By: Nelson Chimes [name]   On: 11/18/2017 14:00   Mr Lumbar Spine Wo Contrast  Result Date: 10/26/2017 CLINICAL DATA:  Extreme low back pain. EXAM: MRI LUMBAR SPINE WITHOUT CONTRAST TECHNIQUE: Multiplanar, multisequence MR imaging of the lumbar spine was performed. No intravenous contrast was administered. COMPARISON:  MRI lumbar spine 08/05/2010. FINDINGS: There is extreme motion degradation. Study is marginally diagnostic. Segmentation: Standard. Alignment: Exaggerated lumbar lordosis. 2 mm anterolisthesis L5-S1. 2 mm compensatory retrolisthesis L3-4 L2-3 and trace L1-2.  Vertebrae: No worrisome osseous lesion. Endplate reactive changes related to disc space narrowing. Conus medullaris and cauda equina: Conus extends to the L1. level. Conus and cauda equina appear normal. Paraspinal and other soft tissues: Renal cystic disease, poorly visualized. Disc levels: L1-L2: Disc space narrowing. Central protrusion. Facet arthropathy. No definite impingement. L2-L3: 2 mm retrolisthesis. Central protrusion. Posterior element hypertrophy. Moderate stenosis. BILATERAL L2 and L3 nerve root impingement likely. L3-L4: Disc space narrowing. Central protrusion. Posterior element hypertrophy. Moderate to severe stenosis. BILATERAL L3 and L4 nerve root impingement. L4-L5: Disc space narrowing. Central protrusion. Posterior element hypertrophy. Severe stenosis. BILATERAL L4 and L5 nerve root impingement. L5-S1: 2 mm anterolisthesis. Annular bulge. Posterior element hypertrophy. No definite impingement. IMPRESSION: Marginally diagnostic study demonstrating moderate to severe stenosis throughout the L2-3 through L4-5 disc spaces related to subluxations, disc space narrowing, disc protrusions, and posterior element  hypertrophy. Symptomatic neural impingement could occur at any or all of those levels. See discussion above. Suspected progression since previous MR from 2012. Electronically Signed   By: Staci Righter [name]   On: 10/26/2017 20:45   Nm Gi Blood Loss  Result Date: 11/20/2017 CLINICAL DATA:  Gastrointestinal bleeding. EXAM: NUCLEAR MEDICINE GASTROINTESTINAL BLEEDING SCAN TECHNIQUE: Sequential abdominal images were obtained following intravenous administration of Tc-78m labeled red blood cells. RADIOPHARMACEUTICALS:  25 mCi Tc-23m pertechnetate in-vitro labeled red cells. COMPARISON:  11/18/2017. FINDINGS: Active gastrointestinal bleeding in the left upper quadrant of the abdomen with tracer extending through tortuous bowel loops in the left abdomen to the pelvis. This is following the expected course of the colon with some reflux into the distal transverse colon. IMPRESSION: Active gastrointestinal bleeding in the region of the splenic flexure of the colon with some reflux into the distal transverse colon and distal passage of tracer into the sigmoid colon. These results will be called to the ordering clinician or representative by the Radiologist Assistant, and communication documented in the PACS or zVision Dashboard. Electronically Signed   By: Claudie Revering [name]   On: 11/20/2017 14:46   Nm Gi Blood Loss  Result Date: 11/18/2017 CLINICAL DATA:  GI bleed EXAM: NUCLEAR MEDICINE GASTROINTESTINAL BLEEDING SCAN TECHNIQUE: Sequential abdominal images were obtained following intravenous administration of Tc-45m labeled red blood cells. RADIOPHARMACEUTICALS:  25 mCi Tc-30m pertechnetate in-vitro labeled red cells. COMPARISON:  None. FINDINGS: Penile uptake. Excretory uptake in the bladder. No findings suspicious for active GI bleed. IMPRESSION: No findings suspicious for active GI bleed. Electronically Signed   By: Julian Hy [name]   On: 11/18/2017 21:53   Ir Angiogram Visceral Selective  Result Date:  11/23/2017 INDICATION: 84 year old male with a history of diverticulosis and acute lower GI bleeding. Tagged nuclear medicine red blood cell study suggests active bleeding in the region of the splenic flexure. He presents for urgent visceral selective angiography and embolization if the bleeding source can be identified. EXAM: IR ULTRASOUND GUIDANCE VASC ACCESS RIGHT; SELECTIVE VISCERAL ARTERIOGRAPHY MEDICATIONS: None ANESTHESIA/SEDATION: Moderate (conscious) sedation was employed during this procedure. A total of Versed 1.5 mg and Fentanyl 150 mcg was administered intravenously. Moderate Sedation Time: 40 minutes. The patient's level of consciousness and vital signs were monitored continuously by radiology nursing throughout the procedure under my direct supervision. CONTRAST:  105 cc Isovue 300 FLUOROSCOPY TIME:  Fluoroscopy Time: 19 minutes 54 seconds (516 mGy). COMPLICATIONS: None immediate. PROCEDURE: Informed consent was obtained from the patient following explanation of the procedure, risks, benefits and alternatives. The patient understands, agrees and consents for the procedure. All questions were  addressed. A time out was performed prior to the initiation of the procedure. Maximal barrier sterile technique utilized including caps, mask, sterile gowns, sterile gloves, large sterile drape, hand hygiene, and Betadine prep. The right common femoral artery was interrogated with ultrasound and found to be widely patent. An image was obtained and stored for the medical record. Local anesthesia was attained by infiltration with 1% lidocaine. A small dermatotomy was made. Under real-time sonographic guidance, the vessel was punctured with a 21 gauge micropuncture needle. Using standard technique, the initial micro needle was exchanged over a 0.018 micro wire for a transitional 4 [place] micro sheath. The micro sheath was then exchanged over a 0.035 wire for a 5 French vascular sheath. A RIM catheter was advanced over  a Bentson wire into the abdominal aorta. Attempts were made to select the inferior mesenteric artery. 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 Therefore, the rim catheter was exchanged over a Bentson wire for a SOS catheter which was successfully used to engage the origin of the inferior mesenteric artery. An inferior mesenteric arteriogram was performed. No evidence of active bleeding. Good visualization of the left colon. The SOS catheter was then removed and replaced with a C2 cobra catheter. The Cobra catheter was used to select the celiac artery. A celiac arteriogram was performed. No evidence of upper GI bleeding. No evidence of replaced middle colic artery. The cobra catheter was then used to select the superior mesenteric artery. A superior mesenteric arteriogram was performed. No evidence of acute arterial bleeding. At this time, the procedure was terminated. The patient was relatively confused and having difficulty staying still. Hemostasis was attained with the assistance of an Angio-Seal extra arterial vascular plug. IMPRESSION: 1. Negative three-vessel visceral arteriogram. No evidence of active lower GI bleeding at this time. 2. Mild fusiform aneurysmal dilatation of the infrarenal abdominal aorta with a maximal diameter of 3 cm. Recommend followup by ultrasound in 3 years. This recommendation follows ACR consensus guidelines: White Paper of the ACR Incidental Findings Committee II on Vascular Findings. Joellyn Rued Radiol 2013; 82:[id number] Signed, Criselda Peaches, MD [facility] Radiology Electronically Signed   By: Jacqulynn Cadet [name]   On: 11/21/2017 10:49   Ir Angiogram Visceral Selective  Result Date: 11/21/2017 INDICATION: 84 year old male with a history of diverticulosis and acute lower GI bleeding. Tagged nuclear medicine red blood cell study  suggests active bleeding in the region of the splenic flexure. He presents for urgent visceral selective angiography and embolization if the bleeding source can be identified. EXAM: IR ULTRASOUND GUIDANCE VASC ACCESS RIGHT; SELECTIVE VISCERAL ARTERIOGRAPHY MEDICATIONS: None ANESTHESIA/SEDATION: Moderate (conscious) sedation was employed during this procedure. A total of Versed 1.5 mg and Fentanyl 150 mcg was administered intravenously. Moderate Sedation Time: 40 minutes. The patient's level of consciousness and vital signs were monitored continuously by radiology nursing throughout the procedure under my direct supervision. CONTRAST:  105 cc Isovue 300 FLUOROSCOPY TIME:  Fluoroscopy Time: 19 minutes 54 seconds (516 mGy). COMPLICATIONS: None immediate. PROCEDURE: Informed consent was obtained from the patient following explanation of the procedure, risks, benefits and alternatives. The patient understands, agrees and consents for the procedure. All questions were addressed. A time out was performed prior to the initiation of the procedure. Maximal barrier sterile technique utilized including caps, mask, sterile gowns, sterile gloves, large sterile drape, hand hygiene, and Betadine prep. The right common femoral artery was  interrogated with ultrasound and found to be widely patent. An image was obtained and stored for the medical record. Local anesthesia was attained by infiltration with 1% lidocaine. A small dermatotomy was made. Under real-time sonographic guidance, the vessel was punctured with a 21 gauge micropuncture needle. Using standard technique, the initial micro needle was exchanged over a 0.018 micro wire for a transitional 4 [place] micro sheath. The micro sheath was then exchanged over a 0.035 wire for a 5 French vascular sheath. A RIM catheter was advanced over a Bentson wire into the abdominal aorta. Attempts were made to select the inferior mesenteric artery. Unfortunately, the inferior abdominal aorta  is mildly aneurysmal (maximal diameter 3.0 cm) and the RIM could not engage the orifice. Therefore, the rim catheter was exchanged over a Bentson wire for a SOS catheter which was successfully used to engage the origin of the inferior mesenteric artery. An inferior mesenteric arteriogram was performed. No evidence of active bleeding. Good visualization of the left colon. The SOS catheter was then removed and replaced with a C2 cobra catheter. The Cobra catheter was used to select the celiac artery. A celiac arteriogram was performed. No evidence of upper GI bleeding. No evidence of replaced middle colic artery. The cobra catheter was then used to select the superior mesenteric artery. A superior mesenteric arteriogram was performed. No evidence of acute arterial bleeding. At this time, the procedure was terminated. The patient was relatively confused and having difficulty staying still. Hemostasis was attained with the assistance of an Angio-Seal extra arterial vascular plug. IMPRESSION: 1. Negative three-vessel visceral arteriogram. No evidence of active lower GI bleeding at this time. 2. Mild fusiform aneurysmal dilatation of the infrarenal abdominal aorta with a maximal diameter of 3 cm. Recommend followup by ultrasound in 3 years. This recommendation follows ACR consensus guidelines: White Paper of the ACR Incidental Findings Committee II on Vascular Findings. Joellyn Rued Radiol 2013; [id number] Signed, Criselda Peaches, MD [facility] Radiology Electronically Signed   By: Jacqulynn Cadet [name]   On: 11/21/2017 10:49   Ir Angiogram Visceral Selective  Result Date: 11/21/2017 INDICATION: 84 year old male with a history of diverticulosis and acute lower GI bleeding. Tagged nuclear medicine red blood cell study suggests active bleeding in the region of the splenic flexure. He presents for urgent visceral selective angiography and embolization if the bleeding  source can be identified. EXAM: IR ULTRASOUND GUIDANCE VASC ACCESS RIGHT; SELECTIVE VISCERAL ARTERIOGRAPHY MEDICATIONS: None ANESTHESIA/SEDATION: Moderate (conscious) sedation was employed during this procedure. A total of Versed 1.5 mg and Fentanyl 150 mcg was administered intravenously. Moderate Sedation Time: 40 minutes. The patient's level of consciousness and vital signs were monitored continuously by radiology nursing throughout the procedure under my direct supervision. CONTRAST:  105 cc Isovue 300 FLUOROSCOPY TIME:  Fluoroscopy Time: 19 minutes 54 seconds (516 mGy). COMPLICATIONS: None immediate. PROCEDURE: Informed consent was obtained from the patient following explanation of the procedure, risks, benefits and alternatives. The patient understands, agrees and consents for the procedure. All questions were addressed. A time out was performed prior to the initiation of the procedure. Maximal barrier sterile technique utilized including caps, mask, sterile gowns, sterile gloves, large sterile drape, hand hygiene, and Betadine prep. The right common femoral artery was interrogated with ultrasound and found to be widely patent. An image was obtained and stored for the medical record. Local anesthesia was attained by infiltration with 1% lidocaine. A small dermatotomy was made. Under real-time sonographic guidance, the vessel  was punctured with a 21 gauge micropuncture needle. Using standard technique, the initial micro needle was exchanged over a 0.018 micro wire for a transitional 4 [place] micro sheath. The micro sheath was then exchanged over a 0.035 wire for a 5 French vascular sheath. A RIM catheter was advanced over a Bentson wire into the abdominal aorta. Attempts were made to select the inferior mesenteric artery. 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 Therefore, the rim catheter was exchanged over a Bentson  wire for a SOS catheter which was successfully used to engage the origin of the inferior mesenteric artery. An inferior mesenteric arteriogram was performed. No evidence of active bleeding. Good visualization of the left colon. The SOS catheter was then removed and replaced with a C2 cobra catheter. The Cobra catheter was used to select the celiac artery. A celiac arteriogram was performed. No evidence of upper GI bleeding. No evidence of replaced middle colic artery. The cobra catheter was then used to select the superior mesenteric artery. A superior mesenteric arteriogram was performed. No evidence of acute arterial bleeding. At this time, the procedure was terminated. The patient was relatively confused and having difficulty staying still. Hemostasis was attained with the assistance of an Angio-Seal extra arterial vascular plug. IMPRESSION: 1. Negative three-vessel visceral arteriogram. No evidence of active lower GI bleeding at this time. 2. Mild fusiform aneurysmal dilatation of the infrarenal abdominal aorta with a maximal diameter of 3 cm. Recommend followup by ultrasound in 3 years. This recommendation follows ACR consensus guidelines: White Paper of the ACR Incidental Findings Committee II on Vascular Findings. Joellyn Rued Radiol 2013; 78:[id number] Signed, Criselda Peaches, MD [facility] Radiology Electronically Signed   By: Jacqulynn Cadet [name]   On: 11/21/2017 10:49   US Renal  Result Date: 11/09/2017 CLINICAL DATA:  Initial evaluation for urinary tract infection. EXAM: RENAL / URINARY TRACT ULTRASOUND COMPLETE COMPARISON:  None. FINDINGS: Right Kidney: Length: 10.8 cm. Echogenicity grossly within normal limits. No mass lesion. No nephrolithiasis or hydronephrosis. Left Kidney: Length: 11.7 cm. Echogenicity grossly within normal limits. 4.1 x 4.4 x 4.8 cm exophytic cyst present at the lower pole of the left kidney. No associated vascularity. No  hydronephrosis or nephrolithiasis. Bladder: Appears normal for degree of bladder distention. IMPRESSION: 1. No acute abnormality.  No hydronephrosis. 2. 4.8 cm exophytic left renal cyst. Electronically Signed   By: Jeannine Boga [name]   On: 11/09/2017 21:51   Ir US Guide Vasc Access Right  Result Date: 11/21/2017 INDICATION: 84 year old male with a history of diverticulosis and acute lower GI bleeding. Tagged nuclear medicine red blood cell study suggests active bleeding in the region of the splenic flexure. He presents for urgent visceral selective angiography and embolization if the bleeding source can be identified. EXAM: IR ULTRASOUND GUIDANCE VASC ACCESS RIGHT; SELECTIVE VISCERAL ARTERIOGRAPHY MEDICATIONS: None ANESTHESIA/SEDATION: Moderate (conscious) sedation was employed during this procedure. A total of Versed 1.5 mg and Fentanyl 150 mcg was administered intravenously. Moderate Sedation Time: 40 minutes. The patient's level of consciousness and vital signs were monitored continuously by radiology nursing throughout the procedure under my direct supervision. CONTRAST:  105 cc Isovue 300 FLUOROSCOPY TIME:  Fluoroscopy Time: 19 minutes 54 seconds (516 mGy). COMPLICATIONS: None immediate. PROCEDURE: Informed consent was obtained from the patient following explanation of the procedure, risks, benefits and alternatives. The patient understands, agrees and consents for the procedure. All questions  were addressed. A time out was performed prior to the initiation of the procedure. Maximal barrier sterile technique utilized including caps, mask, sterile gowns, sterile gloves, large sterile drape, hand hygiene, and Betadine prep. The right common femoral artery was interrogated with ultrasound and found to be widely patent. An image was obtained and stored for the medical record. Local anesthesia was attained by infiltration with 1% lidocaine. A small dermatotomy was made. Under real-time sonographic  guidance, the vessel was punctured with a 21 gauge micropuncture needle. Using standard technique, the initial micro needle was exchanged over a 0.018 micro wire for a transitional 4 [place] micro sheath. The micro sheath was then exchanged over a 0.035 wire for a 5 French vascular sheath. A RIM catheter was advanced over a Bentson wire into the abdominal aorta. Attempts were made to select the inferior mesenteric artery. 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 Therefore, the rim catheter was exchanged over a Bentson wire for a SOS catheter which was successfully used to engage the origin of the inferior mesenteric artery. An inferior mesenteric arteriogram was performed. No evidence of active bleeding. Good visualization of the left colon. The SOS catheter was then removed and replaced with a C2 cobra catheter. The Cobra catheter was used to select the celiac artery. A celiac arteriogram was performed. No evidence of upper GI bleeding. No evidence of replaced middle colic artery. The cobra catheter was then used to select the superior mesenteric artery. A superior mesenteric arteriogram was performed. No evidence of acute arterial bleeding. At this time, the procedure was terminated. The patient was relatively confused and having difficulty staying still. Hemostasis was attained with the assistance of an Angio-Seal extra arterial vascular plug. IMPRESSION: 1. Negative three-vessel visceral arteriogram. No evidence of active lower GI bleeding at this time. 2. Mild fusiform aneurysmal dilatation of the infrarenal abdominal aorta with a maximal diameter of 3 cm. Recommend followup by ultrasound in 3 years. This recommendation follows ACR consensus guidelines: White Paper of the ACR Incidental Findings Committee II on Vascular Findings. Joellyn Rued Radiol 2013; 67:[id number] Signed, Criselda Peaches, MD [facility] Radiology Electronically Signed   By: Jacqulynn Cadet [name]   On: 11/21/2017 10:49   Dg Chest Port 1 View  Result Date: 11/15/2017 CLINICAL DATA:  Hypoxia. EXAM: PORTABLE CHEST 1 VIEW COMPARISON:  Multiple prior plain films including most recently 11/13/2017. CT 11/18/2013. FINDINGS: Mildly degraded exam due to AP portable technique and patient body habitus. Chin overlies the apices. Midline trachea. Moderate cardiomegaly. Atherosclerosis in the transverse aorta. Right paratracheal soft tissue fullness is similar over multiple prior exams and likely related to right-sided thyroid enlargement. Probable layering bilateral pleural effusions. No pneumothorax. No congestive failure. Bibasilar airspace disease. IMPRESSION: Similar appearance of layering bilateral pleural effusions and bibasilar atelectasis or infection. Cardiomegaly without congestive failure. Aortic Atherosclerosis (ICD10-I70.0). Electronically Signed   By: Abigail Miyamoto [name]   On: 11/15/2017 14:04   Dg Chest Port 1 View  Result Date: 11/13/2017 CLINICAL DATA:  Bradycardia.  Former smoker. EXAM: PORTABLE CHEST 1 VIEW COMPARISON:  11/11/2017; 10/25/2017; 04/07/2017 FINDINGS: Grossly unchanged enlarged cardiac silhouette and mediastinal contours with atherosclerotic plaque within thoracic aorta. Grossly unchanged small bilateral effusions and associated bibasilar heterogeneous/consolidative opacities, left greater than right. No new focal airspace opacities. Re demonstrated thinning the biapical pulmonary parenchyma however the pulmonary vasculature appears indistinct within mid and lower lungs.  No definite pneumothorax, though note, evaluation degraded secondary to patient's overlying chin. No acute osseus abnormalities. IMPRESSION: Similar findings of cardiomegaly and pulmonary edema superimposed on advanced emphysematous change with small bilateral effusions and associated bibasilar opacities, left greater than  right, atelectasis versus infiltrate. Electronically Signed   By: Sandi Mariscal [name]   On: 11/13/2017 15:59   Dg Chest Port 1 View  Result Date: 11/11/2017 CLINICAL DATA:  Congestive failure EXAM: PORTABLE CHEST 1 VIEW COMPARISON:  11/08/2017 FINDINGS: Cardiac shadow is enlarged but stable. Aortic calcifications are again seen. Fullness in the right paratracheal region is again noted and stable. This is related to vascularity and the right thyroid lobe as seen on prior CT examination from 2015. Small left pleural effusion is noted. No focal infiltrate is seen. IMPRESSION: Stable small pleural effusion on the left. No new focal abnormality is seen. Electronically Signed   By: Inez Catalina [name]   On: 11/11/2017 15:50   Dg Chest Portable 1 View  Result Date: 11/08/2017 CLINICAL DATA:  Patient with worsening lethargy. EXAM: PORTABLE CHEST 1 VIEW COMPARISON:  Chest radiograph 10/28/2017. FINDINGS: Limited rotated exam. Monitoring leads overlie the patient. Stable cardiomegaly. Similar right paratracheal density. Thoracic aortic vascular calcifications. Low lung volumes. Bibasilar heterogeneous opacities. Mild interstitial opacities bilaterally. No definite pleural effusion or pneumothorax. IMPRESSION: Cardiomegaly. Interstitial opacities bilaterally may represent mild edema. Basilar atelectasis. Electronically Signed   By: Lovey Newcomer [name]   On: 11/08/2017 16:22   Dg Chest Port 1 View  Result Date: 10/28/2017 CLINICAL DATA:  Monoclonal gammopathy of unknown significance. History of CHF, COPD, atrial fibrillation, on home oxygen. EXAM: PORTABLE CHEST 1 VIEW COMPARISON:  Chest x-ray of Oct 25, 2017 FINDINGS: The lungs are well-expanded. There is stable soft tissue fullness in the right paratracheal region. The cardiac silhouette remains enlarged. The pulmonary vascularity is not engorged. There is calcification in the wall of the aortic arch. There degenerative changes of both shoulders. IMPRESSION: Resolution of  pulmonary interstitial edema. Stable cardiac enlargement. No pulmonary vascular congestion. COPD. No acute pneumonia. Stable soft tissue fullness in the right paratracheal region which reflects prominent vascular structures on a CT scan in May of 2015. Thoracic aortic atherosclerosis. Electronically Signed   By: David  Martinique [name]   On: 10/28/2017 11:05   Dg Swallowing Func-speech Pathology  Result Date: 10/27/2017 Objective Swallowing Evaluation: Type of Study: MBS-Modified Barium Swallow Study  Patient Details Name: DORAN NESTLE MRN: [id number] Date of Birth: 1930/10/19 Today's Date: 10/27/2017 Time: SLP Start Time (ACUTE ONLY): 1425 -SLP Stop Time (ACUTE ONLY): 1500 SLP Time Calculation (min) (ACUTE ONLY): 35 min Past Medical History: Past Medical History: Diagnosis Date . Acute bronchitis  . Adenomatous colon polyp  . Allergic rhinitis  . Anemia   iron deficient . Atrial fibrillation ([place])  . Atrial flutter ([place])  . Chronic gastritis  . COPD (chronic obstructive pulmonary disease) ([place])  . Diastolic dysfunction  . Diverticulosis of colon   recurrent GI bleeding . DJD (degenerative joint disease)  . GERD (gastroesophageal reflux disease)  . GI bleed  . HTN (hypertension)  . Hyperplastic colon polyp 2003 . Hypothyroidism  . Moderate aortic insufficiency  . OSA (obstructive sleep apnea)   compliants w/ CPAP . Positive PPD   remote . Shortness of breath  . Venous insufficiency  Past Surgical History: Past Surgical History: Procedure Laterality Date . CARDIAC CATHETERIZATION  7.27.05  revealed a preserved EF with no CAD . COLONOSCOPY   . HERNIA REPAIR Bilateral  .  JOINT REPLACEMENT  2001  lt total knee-rt one done 00 . NASAL SINUS SURGERY   . POLYPECTOMY    adenomatous polyps 2010 . SHOULDER ARTHROSCOPY   . TOE SURGERY Right  . TONSILLECTOMY   . TOTAL KNEE ARTHROPLASTY    bilateral HPI: Richard Rosales is a 84 y.o. male with medical history significant of HTN, A. fib, COPD on 2 L of nasal cannula oxygen, CHF,  hypothyroidism, MGUS, OSA on CPAP, and venous stasis; who presents with complaints of inability to ambulate.  At baseline patient uses a walker to ambulate.  2 days prior to admit pt have problems standing and fell back into a chair.   Pt unable to care for himself at home and his primary care provider advised him to come back into the hospital for further treatment.  He he has these chronic venous stasis ulcerations.  Patient also report to md having a 60 pound weight loss after changing his diet in the last 18 months, but for the last 6 months he reports that his weight is stable.  Other associated symptoms include increased shortness of breath and worsening tremor.  Patient denies any history of Parkinson's disease.  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  Family reports to RN pt coughs with intake - RN informed MD and swallow eval ordered.  Imaging study chest showed venous pulmonary congestion and possible pericardial effusion.  Pt found to have degenerative changes  C4-C5, C5-C6, C6-C7 on prior imaging study 2005.  SLP recommended to defer clinical eval and proceed with MBS.  Subjective: pt awake in chair Assessment / Plan / Recommendation CHL IP CLINICAL IMPRESSIONS 10/27/2017 Clinical Impression Patient presents with mild pharyngoesophageal phase dysphagia without aspiration and only trace penetration of sequential swallows of thin.  Decreased timing and adequacy of laryngeal closure resulted in mild penetration with thin.  Pt observed to have retained secretions in pharynx that mixed with barium.  He was sensate to mild vallecular residuals and requested liquids to clear.   Decreased UES opening noted with resultant residuals  - ? prominent CP and mild backflow of liquids to distal pharynx without sensation..   Fortunately this was minimal and pt's lateral channels provided protection.    Suspect this backflow and pt's kyphosis (known h/o GERD) may  contribute to pt's coughing over intake.  Of note, pt did NOT cough througout entire MBS - however after MBS, he did cough and expectorate viscous clear secretions.  Advised him to keep his cough/hock strong for airway protection.  Using live video, educated pt to findings/recommendations.  Will follow for dysphagia management.  SLP Visit Diagnosis Dysphagia, pharyngeal phase (R13.13);Dysphagia, pharyngoesophageal phase (R13.14) Attention and concentration deficit following -- Frontal lobe and executive function deficit following -- Impact on safety and function Moderate aspiration risk;Risk for inadequate nutrition/hydration   CHL IP TREATMENT RECOMMENDATION 10/27/2017 Treatment Recommendations Therapy as outlined in treatment plan below   Prognosis 10/27/2017 Prognosis for Safe Diet Advancement Good Barriers to Reach Goals -- Barriers/Prognosis Comment -- CHL IP DIET RECOMMENDATION 10/27/2017 SLP Diet Recommendations Regular solids;Thin liquid Liquid Administration via Straw Medication Administration Whole meds with liquid Compensations Minimize environmental distractions;Slow rate;Small sips/bites;Other (Comment) Postural Changes Remain semi-upright after after feeds/meals (Comment);Seated upright at 90 degrees   CHL IP OTHER RECOMMENDATIONS 10/27/2017 Recommended Consults -- Oral Care Recommendations Oral care BID Other Recommendations --   No flowsheet data found.  CHL IP FREQUENCY AND DURATION 10/27/2017 Speech Therapy Frequency (  ACUTE ONLY) min 2x/week Treatment Duration 1 week      CHL IP ORAL PHASE 10/27/2017 Oral Phase Impaired Oral - Pudding Teaspoon -- Oral - Pudding Cup -- Oral - Honey Teaspoon -- Oral - Honey Cup -- Oral - Nectar Teaspoon -- Oral - Nectar Cup WFL Oral - Nectar Straw -- Oral - Thin Teaspoon -- Oral - Thin Cup WFL Oral - Thin Straw WFL Oral - Puree WFL Oral - Mech Soft WFL Oral - Regular -- Oral - Multi-Consistency -- Oral - Pill WFL Oral Phase - Comment --  CHL IP PHARYNGEAL PHASE 10/27/2017  Pharyngeal Phase Impaired Pharyngeal- Pudding Teaspoon -- Pharyngeal -- Pharyngeal- Pudding Cup -- Pharyngeal -- Pharyngeal- Honey Teaspoon -- Pharyngeal -- Pharyngeal- Honey Cup -- Pharyngeal -- Pharyngeal- Nectar Teaspoon -- Pharyngeal -- Pharyngeal- Nectar Cup Reduced tongue base retraction;Pharyngeal residue - valleculae Pharyngeal -- Pharyngeal- Nectar Straw -- Pharyngeal -- Pharyngeal- Thin Teaspoon -- Pharyngeal -- Pharyngeal- Thin Cup Pharyngeal residue - valleculae;Reduced tongue base retraction;Penetration/Aspiration during swallow;Reduced laryngeal elevation Pharyngeal Material enters airway, remains ABOVE vocal cords and not ejected out Pharyngeal- Thin Straw Reduced tongue base retraction;Pharyngeal residue - valleculae;Reduced laryngeal elevation;Reduced airway/laryngeal closure Pharyngeal -- Pharyngeal- Puree WFL Pharyngeal -- Pharyngeal- Mechanical Soft -- Pharyngeal -- Pharyngeal- Regular WFL Pharyngeal -- Pharyngeal- Multi-consistency -- Pharyngeal -- Pharyngeal- Pill WFL Pharyngeal -- Pharyngeal Comment --  CHL IP CERVICAL ESOPHAGEAL PHASE 10/27/2017 Cervical Esophageal Phase Impaired Pudding Teaspoon -- Pudding Cup -- Honey Teaspoon -- Honey Cup -- Nectar Teaspoon -- Nectar Cup -- Nectar Straw -- Thin Teaspoon -- Thin Cup Reduced cricopharyngeal relaxation;Prominent cricopharyngeal segment Thin Straw Reduced cricopharyngeal relaxation;Prominent cricopharyngeal segment Puree Reduced cricopharyngeal relaxation;Prominent cricopharyngeal segment Mechanical Soft -- Regular Reduced cricopharyngeal relaxation;Prominent cricopharyngeal segment Multi-consistency -- Pill Reduced cricopharyngeal relaxation;Prominent cricopharyngeal segment Cervical Esophageal Comment barium tablet taken with thin easily transited though pharynx and esophagus, pt did appear to have minimal stasis at mid-esophagus without sensation, this eventually cleared independently No flowsheet data found. Janett Labella  [place], [place] [facility] [phone]               5/31: 3 vessel mesenteric angiogram: negative for acute bleeding   Subjective: No BM since yesterday morning which had a small smear of blood in it. No abd pain, N/V, chest pain, dyspnea, or palpitations. Has chronic morning cough which is stable. Wants to go to [facility] today.   Discharge Exam: Vitals:   11/24/17 0727 11/24/17 0837  BP: (!) 105/57 (!) 105/57  Pulse: (!) 50 (!) 103  Resp: 16 17  Temp: 98.3 F (36.8 C)   SpO2: 96% 95%   General: Pt is alert, awake, not in acute distress Cardiovascular:  Irreg irreg w/rate in 80's. No murmur, rub, or gallop. No JVD, no significant pedal edema. Respiratory: Nonlabored with supplemental oxygen, diminished but clear bilaterally.  Abdominal: Soft, NT, ND, bowel sounds + Neuro: Alert and oriented. Diffusely weak in proximal > distal muscle groups of LE's.  Labs: BNP (last 3 results) Recent Labs    10/26/17 1821 11/11/17 1601 11/14/17 0245  BNP 413.3* 646.8* 174.0*   Basic Metabolic Panel: Recent Labs  Lab 11/18/17 0752 11/22/17 0627 11/22/17 0927 11/23/17 0543  NA 133* 134* 134* 136  K 4.5 4.2 3.5 3.3*  CL 95* 100* 98* 98*  CO2 28 24 28 30   GLUCOSE 89 99 138* 97  BUN 13 28* 28* 22*  CREATININE 0.77 0.86 0.98 0.75  CALCIUM 8.2* 7.7* 7.8* 8.0*  MG 1.9  --   --   --  Liver Function Tests: No results for input(s): AST, ALT, ALKPHOS, BILITOT, PROT, ALBUMIN in the last 168 hours. No results for input(s): LIPASE, AMYLASE in the last 168 hours. No results for input(s): AMMONIA in the last 168 hours. CBC: Recent Labs  Lab 11/20/17 0722  11/21/17 0243 11/21/17 1452 11/22/17 0927 11/22/17 1552 11/23/17 0543 11/24/17 0352  WBC 9.3   < > 8.7 8.4 7.6  --  6.4 6.6  NEUTROABS 5.9  --  6.2  --   --   --   --   --   HGB 7.8*   < > 7.9* 9.8* 8.4* 8.0* 9.6* 9.2*  HCT 23.0*   < > 24.1* 29.3* 25.4* 23.6* 29.1* 27.8*  MCV 90.6   < > 89.3 86.7 88.5  --  89.5 89.7  PLT 235   < > 245 259  279  --  222 233   < > = values in this interval not displayed.   Cardiac Enzymes: No results for input(s): CKTOTAL, CKMB, CKMBINDEX, TROPONINI in the last 168 hours. BNP: Invalid input(s): POCBNP CBG: Recent Labs  Lab 11/19/17 0722 11/20/17 0757 11/22/17 0808 11/23/17 0743 11/24/17 0627  GLUCAP 97 107* 99 81 107*   D-Dimer No results for input(s): DDIMER in the last 72 hours. Hgb A1c No results for input(s): HGBA1C in the last 72 hours. Lipid Profile No results for input(s): CHOL, HDL, LDLCALC, TRIG, CHOLHDL, LDLDIRECT in the last 72 hours. Thyroid function studies No results for input(s): TSH, T4TOTAL, T3FREE, THYROIDAB in the last 72 hours.  Invalid input(s): FREET3 Anemia work up No results for input(s): VITAMINB12, FOLATE, FERRITIN, TIBC, IRON, RETICCTPCT in the last 72 hours. Urinalysis    Component Value Date/Time   COLORURINE YELLOW 11/13/2017 1530   APPEARANCEUR HAZY (A) 11/13/2017 1530   LABSPEC 1.013 11/13/2017 1530   PHURINE 5.0 11/13/2017 1530   GLUCOSEU NEGATIVE 11/13/2017 1530   HGBUR NEGATIVE 11/13/2017 1530   BILIRUBINUR NEGATIVE 11/13/2017 1530   KETONESUR NEGATIVE 11/13/2017 1530   PROTEINUR NEGATIVE 11/13/2017 1530   UROBILINOGEN 1.0 11/11/2013 0125   NITRITE NEGATIVE 11/13/2017 [place] 11/13/2017 1530    Microbiology No results found for this or any previous visit (from the past 240 hour(s)).  Time coordinating discharge: Approximately 40 minutes  Patrecia Pour, MD  [facility] 11/24/2017, 8:57 AM Pager [phone]

## 2017-11-25 DIAGNOSIS — J449 Chronic obstructive pulmonary disease, unspecified: Secondary | ICD-10-CM | POA: Diagnosis not present

## 2017-11-25 DIAGNOSIS — K5781 Diverticulitis of intestine, part unspecified, with perforation and abscess with bleeding: Secondary | ICD-10-CM | POA: Diagnosis not present

## 2017-11-25 DIAGNOSIS — I480 Paroxysmal atrial fibrillation: Secondary | ICD-10-CM | POA: Diagnosis not present

## 2017-11-25 DIAGNOSIS — I5032 Chronic diastolic (congestive) heart failure: Secondary | ICD-10-CM | POA: Diagnosis not present

## 2017-11-25 DIAGNOSIS — G4733 Obstructive sleep apnea (adult) (pediatric): Secondary | ICD-10-CM | POA: Diagnosis not present

## 2017-11-25 DIAGNOSIS — R2689 Other abnormalities of gait and mobility: Secondary | ICD-10-CM | POA: Diagnosis not present

## 2017-11-25 DIAGNOSIS — M545 Low back pain: Secondary | ICD-10-CM | POA: Diagnosis not present

## 2017-11-25 DIAGNOSIS — R5381 Other malaise: Secondary | ICD-10-CM | POA: Diagnosis not present

## 2017-11-25 DIAGNOSIS — E038 Other specified hypothyroidism: Secondary | ICD-10-CM | POA: Diagnosis not present

## 2017-11-26 ENCOUNTER — Ambulatory Visit: Payer: Medicare Other | Admitting: Physician Assistant

## 2017-12-02 ENCOUNTER — Encounter

## 2017-12-02 ENCOUNTER — Ambulatory Visit (INDEPENDENT_AMBULATORY_CARE_PROVIDER_SITE_OTHER): Payer: Medicare Other | Admitting: Internal Medicine

## 2017-12-02 ENCOUNTER — Encounter: Payer: Self-pay | Admitting: Internal Medicine

## 2017-12-02 VITALS — BP 122/60 | HR 84 | Ht 69.0 in | Wt 232.0 lb

## 2017-12-02 DIAGNOSIS — G4733 Obstructive sleep apnea (adult) (pediatric): Secondary | ICD-10-CM

## 2017-12-02 DIAGNOSIS — E876 Hypokalemia: Secondary | ICD-10-CM

## 2017-12-02 DIAGNOSIS — I872 Venous insufficiency (chronic) (peripheral): Secondary | ICD-10-CM

## 2017-12-02 DIAGNOSIS — I482 Chronic atrial fibrillation, unspecified: Secondary | ICD-10-CM

## 2017-12-02 NOTE — Progress Notes (Signed)
PCP: Unk Pinto, MD   Primary EP: Dr Sabas Sous is a 84 y.o. male who presents today for routine electrophysiology followup.  Since last being seen in our clinic, the patient continues to decline.  He was hospitalized in May for syncope which was felt to be due to dehydration.  His wife reports that with DVT prophylaxis, he had massive GI bleed requiring 4 u PRBCs.  He has significant anemia and hypokalemia at that time. Venous insufficiency is worse.  SOB is stable. Today, he denies symptoms of palpitations, chest pain,  dizziness, presyncope, or syncope.  The patient is otherwise without complaint today.   Past Medical History:  Diagnosis Date  . Acute bronchitis   . Adenomatous colon polyp   . Allergic rhinitis   . Anemia    iron deficient  . Atrial fibrillation ([place])   . Atrial flutter ([place])   . Chronic gastritis   . COPD (chronic obstructive pulmonary disease) ([place])   . Diastolic dysfunction   . Diverticulosis of colon    recurrent GI bleeding  . DJD (degenerative joint disease)   . GERD (gastroesophageal reflux disease)   . GI bleed   . HTN (hypertension)   . Hyperplastic colon polyp 2003  . Hypothyroidism   . Moderate aortic insufficiency   . OSA (obstructive sleep apnea)    compliants w/ CPAP  . Positive PPD    remote  . Shortness of breath   . Venous insufficiency    Past Surgical History:  Procedure Laterality Date  . CARDIAC CATHETERIZATION  7.27.05   revealed a preserved EF with no CAD  . COLONOSCOPY    . HERNIA REPAIR Bilateral   . IR ANGIOGRAM VISCERAL SELECTIVE  11/20/2017  . IR ANGIOGRAM VISCERAL SELECTIVE  11/20/2017  . IR ANGIOGRAM VISCERAL SELECTIVE  11/20/2017  . IR US GUIDE VASC ACCESS RIGHT  11/20/2017  . JOINT REPLACEMENT  2001   lt total knee-rt one done 00  . NASAL SINUS SURGERY    . POLYPECTOMY     adenomatous polyps 2010  . SHOULDER ARTHROSCOPY    . TOE SURGERY Right   . TONSILLECTOMY    . TOTAL KNEE ARTHROPLASTY     bilateral    ROS- all systems are reviewed and negatives except as per HPI above  Current Outpatient Medications  Medication Sig Dispense Refill  . albuterol (PROVENTIL HFA;VENTOLIN HFA) 108 (90 Base) MCG/ACT inhaler Inhale 2 puffs into the lungs every 6 (six) hours as needed for wheezing or shortness of breath. 1 Inhaler 2  . albuterol (PROVENTIL) (2.5 MG/3ML) 0.083% nebulizer solution Take 3 mLs (2.5 mg total) by nebulization every 4 (four) hours as needed for wheezing or shortness of breath. 300 mL 11  . aspirin EC 81 MG tablet Take 81 mg by mouth every Monday, Wednesday, and Friday.    Marland Kitchen azelastine (ASTELIN) 0.1 % nasal spray USE 1 SPRAY IN EACH NOSTRIL TWICE A DAY 90 mL 1  . b complex vitamins capsule Take 1 capsule by mouth daily.    . bisacodyl (DULCOLAX) 10 MG suppository Place 10 mg rectally once as needed (if no BM in 8 hours after MOM administered).    . calcium carbonate (TUMS - DOSED IN MG ELEMENTAL CALCIUM) 500 MG chewable tablet Chew 2 tablets by mouth 3 (three) times daily as needed for indigestion.    . Cholecalciferol (VITAMIN D) 2000 units tablet Take 4,000 Units by mouth daily.    Marland Kitchen docusate sodium (  COLACE) 100 MG capsule Take 100 mg by mouth 2 (two) times daily.      . furosemide (LASIX) 40 MG tablet Take 1 tablet (40 mg total) by mouth daily.    Marland Kitchen GLUCOSAMINE-CHONDROITIN PO Take 1 capsule by mouth daily.    Marland Kitchen HYDROcodone-acetaminophen (NORCO/VICODIN) 5-325 MG tablet Take 1-2 tablets by mouth every 4 (four) hours as needed for moderate pain or severe pain. 15 tablet 0  . levothyroxine (SYNTHROID, LEVOTHROID) 100 MCG tablet TAKE 1 TABLET DAILY BEFORE BREAKFAST 90 tablet 4  . Magnesium Hydroxide (MILK OF MAGNESIA PO) Take 30 mLs by mouth once as needed (if no BM in 3 days).    . metoprolol succinate (TOPROL XL) 25 MG 24 hr tablet Take 1 tablet (25 mg total) by mouth daily.    . Multiple Vitamin (MULTIVITAMIN WITH MINERALS) TABS tablet Take 1 tablet by mouth daily.    .  Omega-3 Fatty Acids (FISH OIL) 1000 MG CAPS Take 1,000 mg by mouth 2 (two) times daily.    Marland Kitchen omeprazole (PRILOSEC) 40 MG capsule TAKE 1 CAPSULE TWICE A DAY (Patient taking differently: Take 40 mg by mouth two times a day) 180 capsule 1  . OXYGEN Inhale 2 L into the lungs continuous.    . potassium chloride (K-DUR) 10 MEQ tablet Take 1 tablet (10 mEq total) by mouth daily. 30 tablet 0  . Sodium Phosphates (FLEET ENEMA RE) Place 1 each rectally once as needed (if no BM within 2 hours after dulcolax suppository).    . SPIRIVA HANDIHALER 18 MCG inhalation capsule INHALE THE CONTENTS OF 1 CAPSULE DAILY 90 capsule 0  . SYMBICORT 160-4.5 MCG/ACT inhaler USE 2 INHALATIONS TWICE A DAY 30.6 g 0  . UNABLE TO FIND CPAP: At bedtime    . ammonium lactate (LAC-HYDRIN) 12 % lotion Apply topically every morning. (Patient not taking: Reported on 11/13/2017) 400 g 0   No current facility-administered medications for this visit.     Physical Exam: Vitals:   12/02/17 1427  BP: 122/60  Pulse: 84  SpO2: 91%  Weight: 232 lb (105.2 kg)  Height: 5\' 9"  (1.753 m)    GEN- The patient is chronically ill appearing, alert and oriented x 3 today.   Head- normocephalic, atraumatic Eyes-  Sclera clear, conjunctiva pink Ears- hearing intact Oropharynx- clear Lungs- Clear to ausculation bilaterally, normal work of breathing Heart- irregular rate and rhythm, 2/6 diastolic murmur, LUSB GI- soft, NT, ND, + BS Extremities- no clubbing, cyanosis, +3 edema  Wt Readings from Last 3 Encounters:  12/02/17 232 lb (105.2 kg)  11/24/17 232 lb (105.2 kg)  11/12/17 234 lb 9.1 oz (106.4 kg)    EKG tracing ordered today is personally reviewed and shows afib with RBBB, frequent PVCs, diffuse t wave changes, LAD  Assessment and Plan:  1. Permanent afib Rate controlled chads2vasc score is 3.  He has declined anticoagulation Rate controlled Stop ASA given recent GI bleed  2. Bradycardia Asymptomatic  3. Hypertensive  cardiovascular disease Stable No change required today  4. Aortic insufficiency Not a candidate for surgery  5. OSA Stable No change required today  6. Venous insufficiency Worse today Support hose and sodium restriction advised  7. Anemia Primary care to manage Cbc today  8. Hypokalemia Bmet, mg today  9. Code Status I had a long discussion with patient and his spouse.  He is very clear that his wishes are DNI/DNR.  Return to see EP PA in a month  Thompson Grayer MD,  [facility] 12/02/2017 2:53 PM

## 2017-12-02 NOTE — Patient Instructions (Addendum)
Medication Instructions:  Your physician has recommended you make the following change in your medication:   1. STOP: Aspirin  Labwork: TODAY: CBC, BMET, MG  Testing/Procedures: None ordered  Follow-Up: Your physician recommends that you follow-up with Tommye Standard, PA on 12/31/17 at 3:15 PM    Any Other Special Instructions Will Be Listed Below (If Applicable).     If you need a refill on your cardiac medications before your next appointment, please call your pharmacy.

## 2017-12-02 NOTE — Addendum Note (Signed)
Addended by: Drue Novel I on: 12/02/2017 03:29 PM   Modules accepted: Orders

## 2017-12-03 LAB — BASIC METABOLIC PANEL
BUN / CREAT RATIO: 23 (ref 10–24)
BUN: 13 mg/dL (ref 8–27)
CALCIUM: 8.4 mg/dL — AB (ref 8.6–10.2)
CHLORIDE: 95 mmol/L — AB (ref 96–106)
CO2: 30 mmol/L — ABNORMAL HIGH (ref 20–29)
Creatinine, Ser: 0.56 mg/dL — ABNORMAL LOW (ref 0.76–1.27)
GFR calc non Af Amer: 94 mL/min/{1.73_m2} (ref >59)
GFR, EST AFRICAN AMERICAN: 109 mL/min/{1.73_m2} (ref >59)
GLUCOSE: 94 mg/dL (ref 65–99)
POTASSIUM: 4.1 mmol/L (ref 3.5–5.2)
Sodium: 138 mmol/L (ref 134–144)

## 2017-12-03 LAB — CBC
HEMATOCRIT: 30.5 % — AB (ref 37.5–51.0)
Hemoglobin: 9.8 g/dL — ABNORMAL LOW (ref 13.0–17.7)
MCH: 29.2 pg (ref 26.6–33.0)
MCHC: 32.1 g/dL (ref 31.5–35.7)
MCV: 91 fL (ref 79–97)
Platelets: 421 10*3/uL (ref 150–450)
RBC: 3.36 x10E6/uL — ABNORMAL LOW (ref 4.14–5.80)
RDW: 14.7 % (ref 12.3–15.4)
WBC: 10 10*3/uL (ref 3.4–10.8)

## 2017-12-03 LAB — MAGNESIUM: MAGNESIUM: 2.1 mg/dL (ref 1.6–2.3)

## 2017-12-10 ENCOUNTER — Telehealth: Payer: Self-pay | Admitting: Internal Medicine

## 2017-12-10 NOTE — Telephone Encounter (Signed)
New Message   Pt's daughter in law Manuela Schwartz is calling because they think the pt needs to go into Hospice and would like to speak with Dr. Rayann Heman or his nurse about it. Please call

## 2017-12-11 NOTE — Telephone Encounter (Signed)
Returned call to family.  Pt is living in [place].  Per family member,  Pt has been bedbound d/t orthostatic hypotension.  Pt is unable to participate with PT.  Family is afraid next step is hospice. Pt was pharmacist, wants to try stopping metoprolol to see if that helps with hypotension. Per review of Pt chart, Pt BP has been well controlled and afib is rate controlled.   Advised family they can refuse medication if they chose to.  Advised [facility] cannot force patient's to take medication. Family would like to try not taking metoprolol to see if Pt has any improvement with hypotension. Advised family to also try increasing fluids, using an abdominal binder and support stockings. Will advise Dr. Rayann Heman and continue to monitor.

## 2017-12-14 ENCOUNTER — Telehealth: Payer: Self-pay | Admitting: Internal Medicine

## 2017-12-14 ENCOUNTER — Non-Acute Institutional Stay: Payer: Self-pay | Admitting: Hospice and Palliative Medicine

## 2017-12-14 DIAGNOSIS — Z515 Encounter for palliative care: Secondary | ICD-10-CM

## 2017-12-14 NOTE — Telephone Encounter (Signed)
Returned call to Pt family member. Family to enroll Pt in hospice. Would like to discontinue Toprol XL at this time. Advised to call if any further needs.

## 2017-12-14 NOTE — Telephone Encounter (Signed)
New Message:     [facility] is needing a fax to [phone] to confirm pt can stop his metoprolol succinate (TOPROL XL) 25 MG 24 hr tablet

## 2017-12-14 NOTE — Telephone Encounter (Signed)
Per Dr. Rayann Heman ok to discontinue Toprol. Order faxed to [facility] as requested.

## 2017-12-15 NOTE — Progress Notes (Signed)
PALLIATIVE CARE CONSULT VISIT   PATIENT NAME: Richard Rosales DOB: 03-Jan-1931 MRN: [id number]  PRIMARY CARE PROVIDER:   Unk Pinto, MD  RESPONSIBLE PARTY:   Self  ASSESSMENT:      I had a lengthy meeting with patient, daughter, and daughter-in-law. Prior to his recent hospitalization, patient was living at home and was mostly independent with use of a walker. However, he is now bed/chairbound and cannot sit upright or stand without assistance. Patient and family do not feel he has significantly improved with rehab and are looking into plans for taking him home. They recognize that he will need 24/7 care and plan to hire sitters. We had a comprehensive discussion about home health vs hospice. Patient says he wants to talk more with his family about these options but family seems to be leaning towards hospice.   Patient says his goals are to maximize quality of life. He said several times that he is 84 years old and knows that he is declining. He seems to expect future decline. We talked about future hospitalizations and he would prefer to avoid these even if it meant that he ultimately would decline and pass. His goals do seem aligned with the hospice philosophy of care. He is a DNR.   Patient is eating less than 50% of meals. He is also limited by chronic dyspnea.   RECOMMENDATIONS and PLAN:  1. Family to consider options for home services. Would recommend hospice care.   I spent 75 minutes providing this consultation,  from 1200 to 1315. More than 50% of the time in this consultation was spent coordinating communication.   HISTORY OF PRESENT ILLNESS:  Richard Rosales is a 84 y.o. year old male with multiple medical problems including CM with EF 40% with history of CHF, O2 depdendent COPD, afib not on anticoagulation due to recent GIB, who was recently hospitalized for syncope and progressive weakness. Workup revealed spinal stenosis and probable polyneuropathy. His hosptialization  was also complicated by GIB.  Patient was seen in consultation by palliative care while in the hospital. He was discharged to rehab and we were consulted to continue conversations about goals.   CODE STATUS: DNR  PPS: 30% HOSPICE ELIGIBILITY/DIAGNOSIS: YES  PAST MEDICAL HISTORY:  Past Medical History:  Diagnosis Date  . Acute bronchitis   . Adenomatous colon polyp   . Allergic rhinitis   . Anemia    iron deficient  . Atrial fibrillation ([place])   . Atrial flutter ([place])   . Chronic gastritis   . COPD (chronic obstructive pulmonary disease) ([place])   . Diastolic dysfunction   . Diverticulosis of colon    recurrent GI bleeding  . DJD (degenerative joint disease)   . GERD (gastroesophageal reflux disease)   . GI bleed   . HTN (hypertension)   . Hyperplastic colon polyp 2003  . Hypothyroidism   . Moderate aortic insufficiency   . OSA (obstructive sleep apnea)    compliants w/ CPAP  . Positive PPD    remote  . Shortness of breath   . Venous insufficiency     SOCIAL HX:  Social History   Tobacco Use  . Smoking status: Former Smoker    Packs/day: 1.00    Years: 20.00    Pack years: 20.00    Types: Cigarettes    Last attempt to quit: 08/01/1978    Years since quitting: 39.4  . Smokeless tobacco: Never Used  Substance Use Topics  . Alcohol use: Yes  Alcohol/week: 0.0 oz    Comment: small glass of wine daily    ALLERGIES:  Allergies  Allergen Reactions  . Other Other (See Comments)    No blood thinners; they caused him to [place]  . Ace Inhibitors Cough  . Beta Adrenergic Blockers Other (See Comments)    Bradycardia   . Decadron [Dexamethasone] Other (See Comments)    Becomes very "mean" if taking this  . Fluticasone-Salmeterol Other (See Comments)    Doesn't remember the reaction  . Hytrin [Terazosin] Other (See Comments)    Nasal congestion   . Prednisone     Shakes / tremors  . Vioxx [Rofecoxib] Other (See Comments)    Dyspepsia      PERTINENT  MEDICATIONS:  Outpatient Encounter Medications as of 12/14/2017  Medication Sig  . albuterol (PROVENTIL HFA;VENTOLIN HFA) 108 (90 Base) MCG/ACT inhaler Inhale 2 puffs into the lungs every 6 (six) hours as needed for wheezing or shortness of breath.  Marland Kitchen albuterol (PROVENTIL) (2.5 MG/3ML) 0.083% nebulizer solution Take 3 mLs (2.5 mg total) by nebulization every 4 (four) hours as needed for wheezing or shortness of breath.  Marland Kitchen ammonium lactate (LAC-HYDRIN) 12 % lotion Apply topically every morning. (Patient not taking: Reported on 11/13/2017)  . azelastine (ASTELIN) 0.1 % nasal spray USE 1 SPRAY IN EACH NOSTRIL TWICE A DAY  . b complex vitamins capsule Take 1 capsule by mouth daily.  . bisacodyl (DULCOLAX) 10 MG suppository Place 10 mg rectally once as needed (if no BM in 8 hours after MOM administered).  . calcium carbonate (TUMS - DOSED IN MG ELEMENTAL CALCIUM) 500 MG chewable tablet Chew 2 tablets by mouth 3 (three) times daily as needed for indigestion.  . Cholecalciferol (VITAMIN D) 2000 units tablet Take 4,000 Units by mouth daily.  Marland Kitchen docusate sodium (COLACE) 100 MG capsule Take 100 mg by mouth 2 (two) times daily.    . furosemide (LASIX) 40 MG tablet Take 1 tablet (40 mg total) by mouth daily.  Marland Kitchen GLUCOSAMINE-CHONDROITIN PO Take 1 capsule by mouth daily.  Marland Kitchen HYDROcodone-acetaminophen (NORCO/VICODIN) 5-325 MG tablet Take 1-2 tablets by mouth every 4 (four) hours as needed for moderate pain or severe pain.  Marland Kitchen levothyroxine (SYNTHROID, LEVOTHROID) 100 MCG tablet TAKE 1 TABLET DAILY BEFORE BREAKFAST  . Magnesium Hydroxide (MILK OF MAGNESIA PO) Take 30 mLs by mouth once as needed (if no BM in 3 days).  . Multiple Vitamin (MULTIVITAMIN WITH MINERALS) TABS tablet Take 1 tablet by mouth daily.  . Omega-3 Fatty Acids (FISH OIL) 1000 MG CAPS Take 1,000 mg by mouth 2 (two) times daily.  Marland Kitchen omeprazole (PRILOSEC) 40 MG capsule TAKE 1 CAPSULE TWICE A DAY (Patient taking differently: Take 40 mg by mouth two times a  day)  . OXYGEN Inhale 2 L into the lungs continuous.  . potassium chloride (K-DUR) 10 MEQ tablet Take 1 tablet (10 mEq total) by mouth daily.  . Sodium Phosphates (FLEET ENEMA RE) Place 1 each rectally once as needed (if no BM within 2 hours after dulcolax suppository).  . SPIRIVA HANDIHALER 18 MCG inhalation capsule INHALE THE CONTENTS OF 1 CAPSULE DAILY  . SYMBICORT 160-4.5 MCG/ACT inhaler USE 2 INHALATIONS TWICE A DAY  . UNABLE TO FIND CPAP: At bedtime   No facility-administered encounter medications on file as of 12/14/2017.     PHYSICAL EXAM:   General: NAD, frail appearing, lying in bed Cardiovascular: irregular Pulmonary: poor air movement without wheeze Abdomen: soft, nontender, + bowel sounds GU: no  suprapubic tenderness Extremities: +edema, no joint deformities Skin: no rashes Neurological: Weakness but otherwise nonfocal  Irean Hong, NP

## 2017-12-25 DIAGNOSIS — I469 Cardiac arrest, cause unspecified: Secondary | ICD-10-CM | POA: Diagnosis not present

## 2017-12-28 ENCOUNTER — Ambulatory Visit: Payer: Medicare Other | Admitting: Internal Medicine

## 2017-12-29 ENCOUNTER — Non-Acute Institutional Stay: Payer: Self-pay | Admitting: Hospice and Palliative Medicine

## 2017-12-29 DIAGNOSIS — Z515 Encounter for palliative care: Secondary | ICD-10-CM

## 2017-12-30 NOTE — Progress Notes (Signed)
Follow up visit attempted. Was informed by staff that patient discharged home with hospice.

## 2017-12-31 ENCOUNTER — Ambulatory Visit: Payer: Medicare Other | Admitting: Physician Assistant

## 2018-01-21 DIAGNOSIS — 419620001 Death: Secondary | SNOMED CT | POA: Diagnosis not present

## 2018-01-21 DEATH — deceased

## 2018-01-26 ENCOUNTER — Ambulatory Visit: Payer: Medicare Other | Admitting: Podiatry

## 2018-12-28 ENCOUNTER — Encounter: Payer: Self-pay | Admitting: Internal Medicine

## 2019-07-10 IMAGING — DX DG CHEST 2V
2 series · 2 of 2 positions shown · non-contrast
Comparison: PA and lateral chest 06/03/2016 and 04/12/2016.

CLINICAL DATA: Chronic cough and shortness of breath.  COPD.

EXAM:
CHEST  2 VIEW

[chest pa]
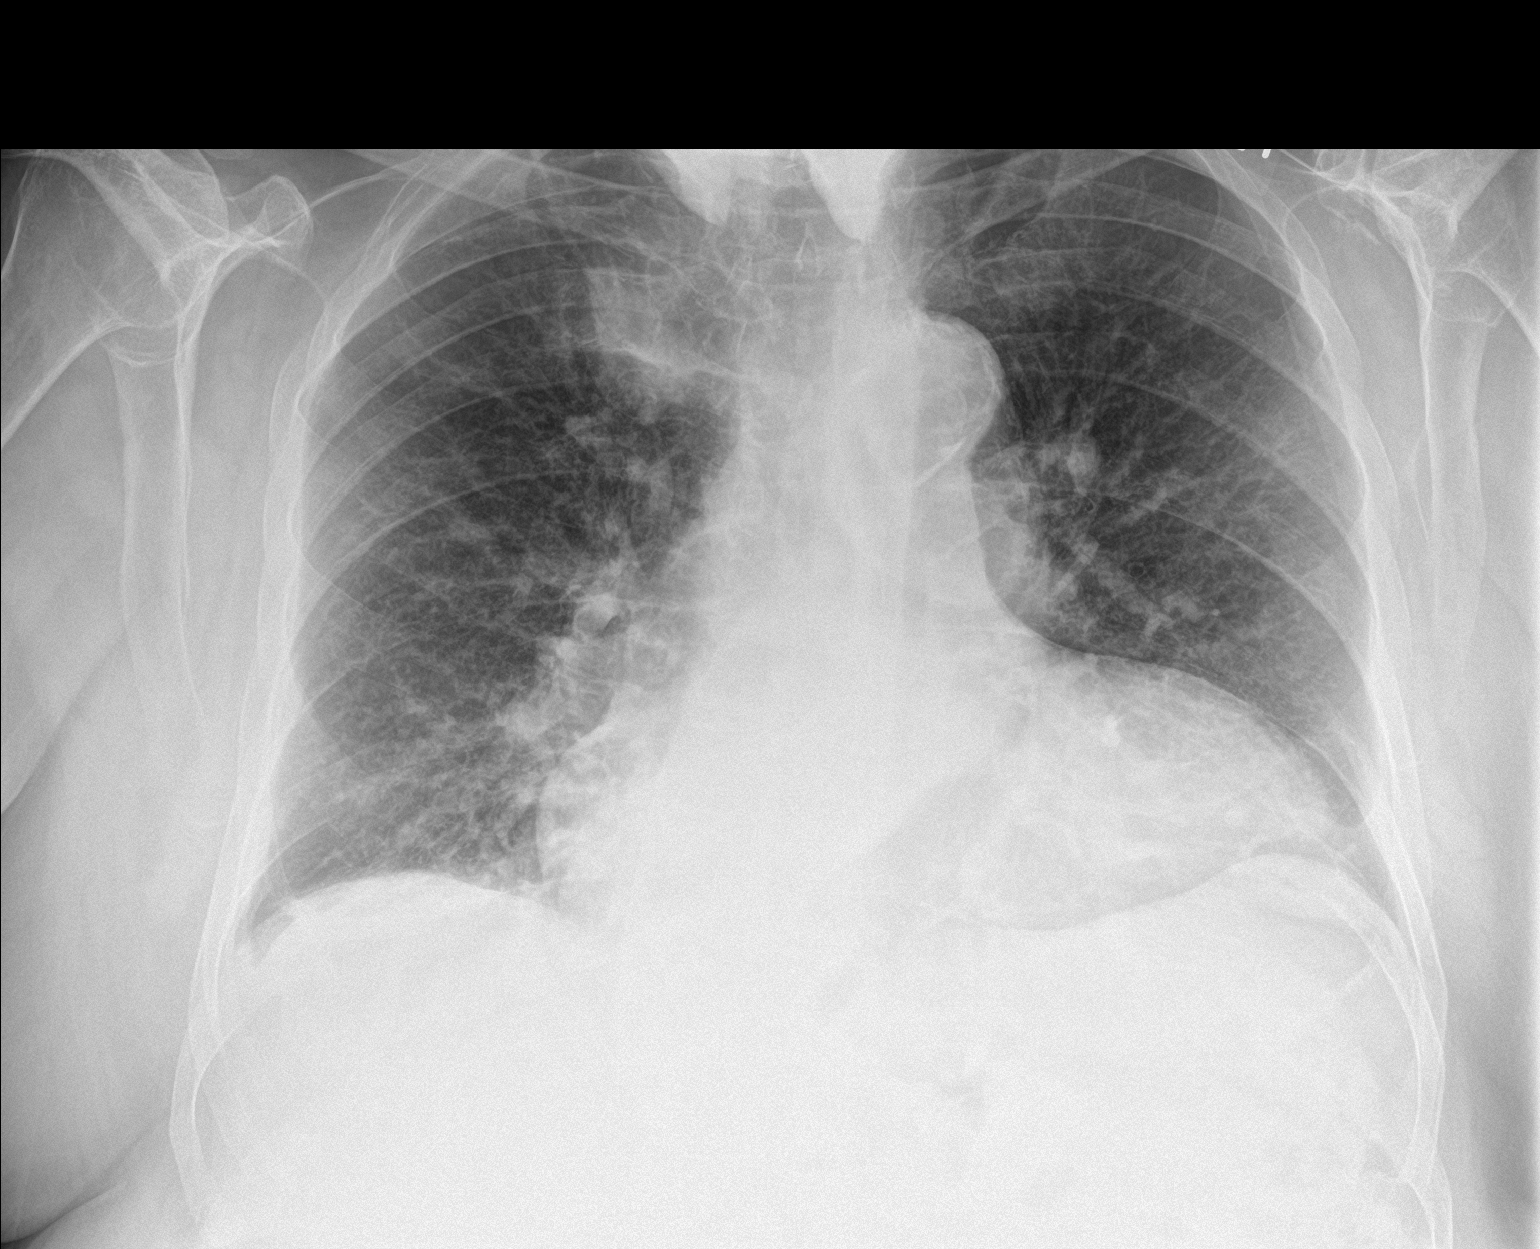

[chest lat]
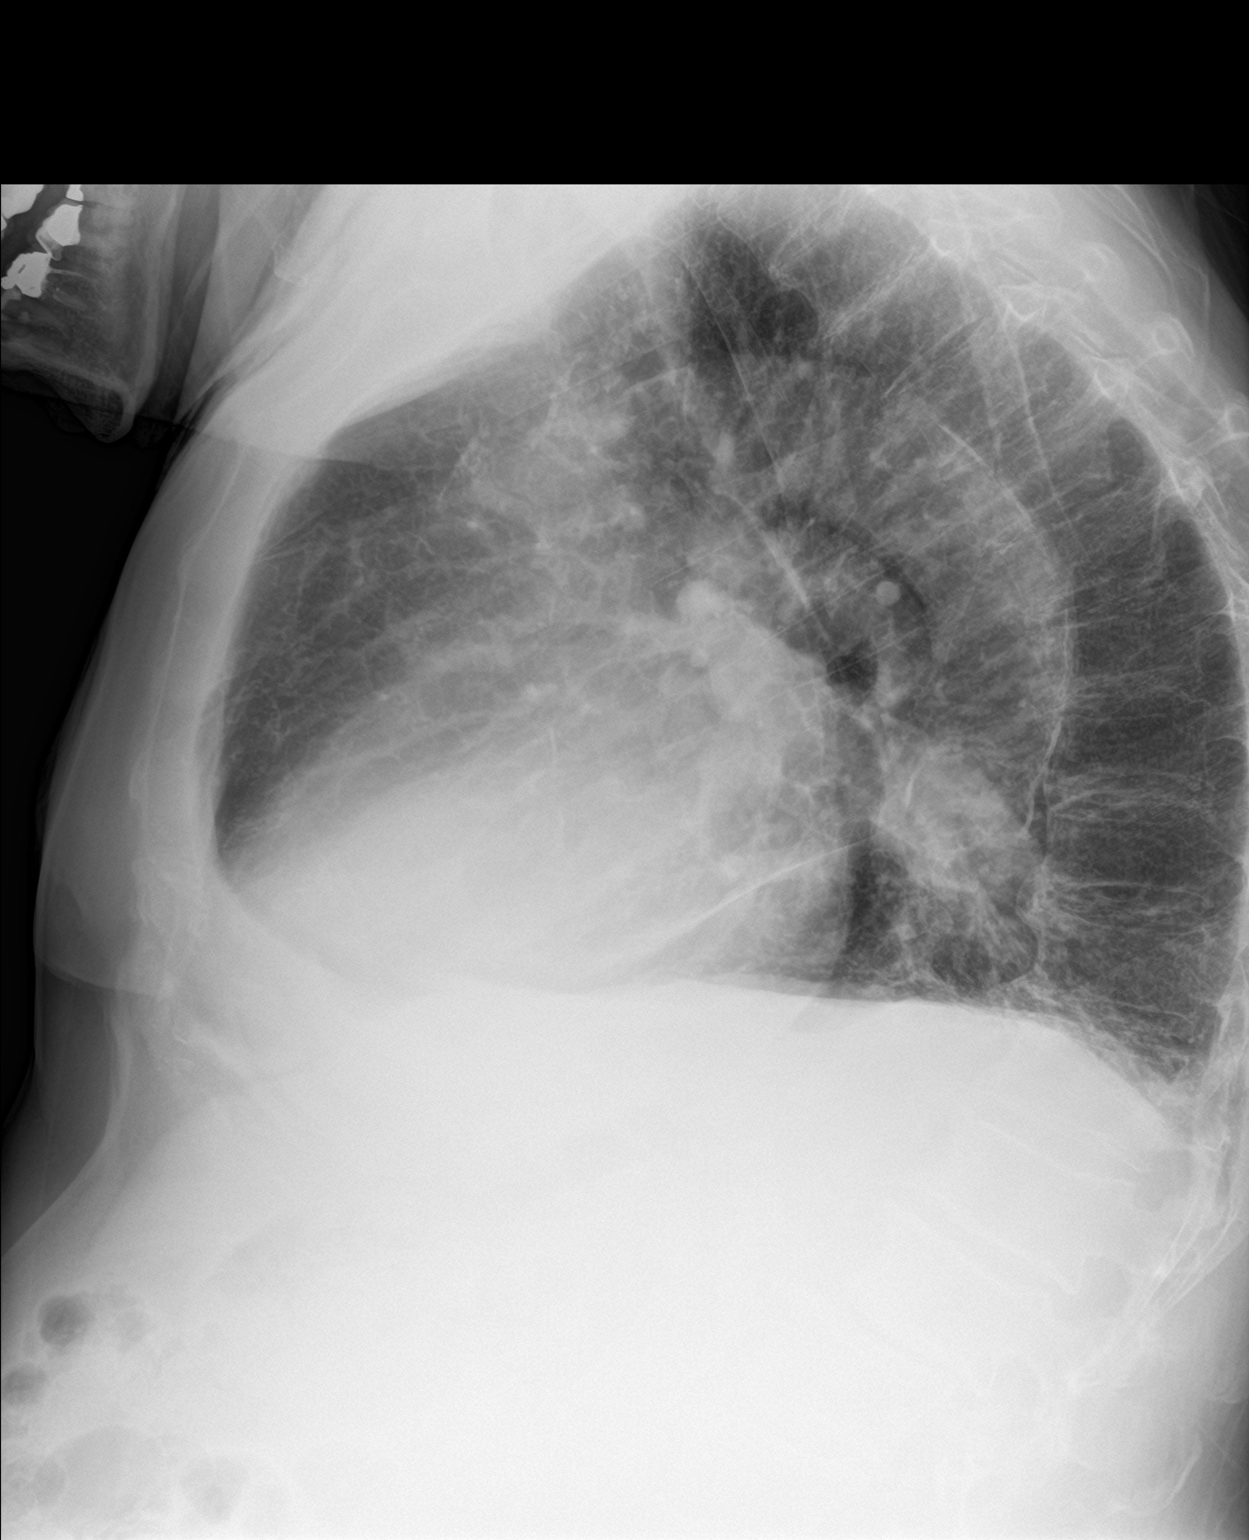

[2 of 2 positions shown; findings below may reference images not displayed]

FINDINGS: There is cardiomegaly without edema. No consolidative process,
pneumothorax or effusion. Atherosclerosis noted.
IMPRESSION: Cardiomegaly without acute disease.

Atherosclerosis.

## 2020-01-27 IMAGING — CR DG CHEST 2V
2 series · 2 of 2 positions shown · non-contrast
Comparison: Chest x-ray 04/07/2017.

CLINICAL DATA: 86-year-old male with history of weakness.

EXAM:
CHEST - 2 VIEW

[w chest lat]
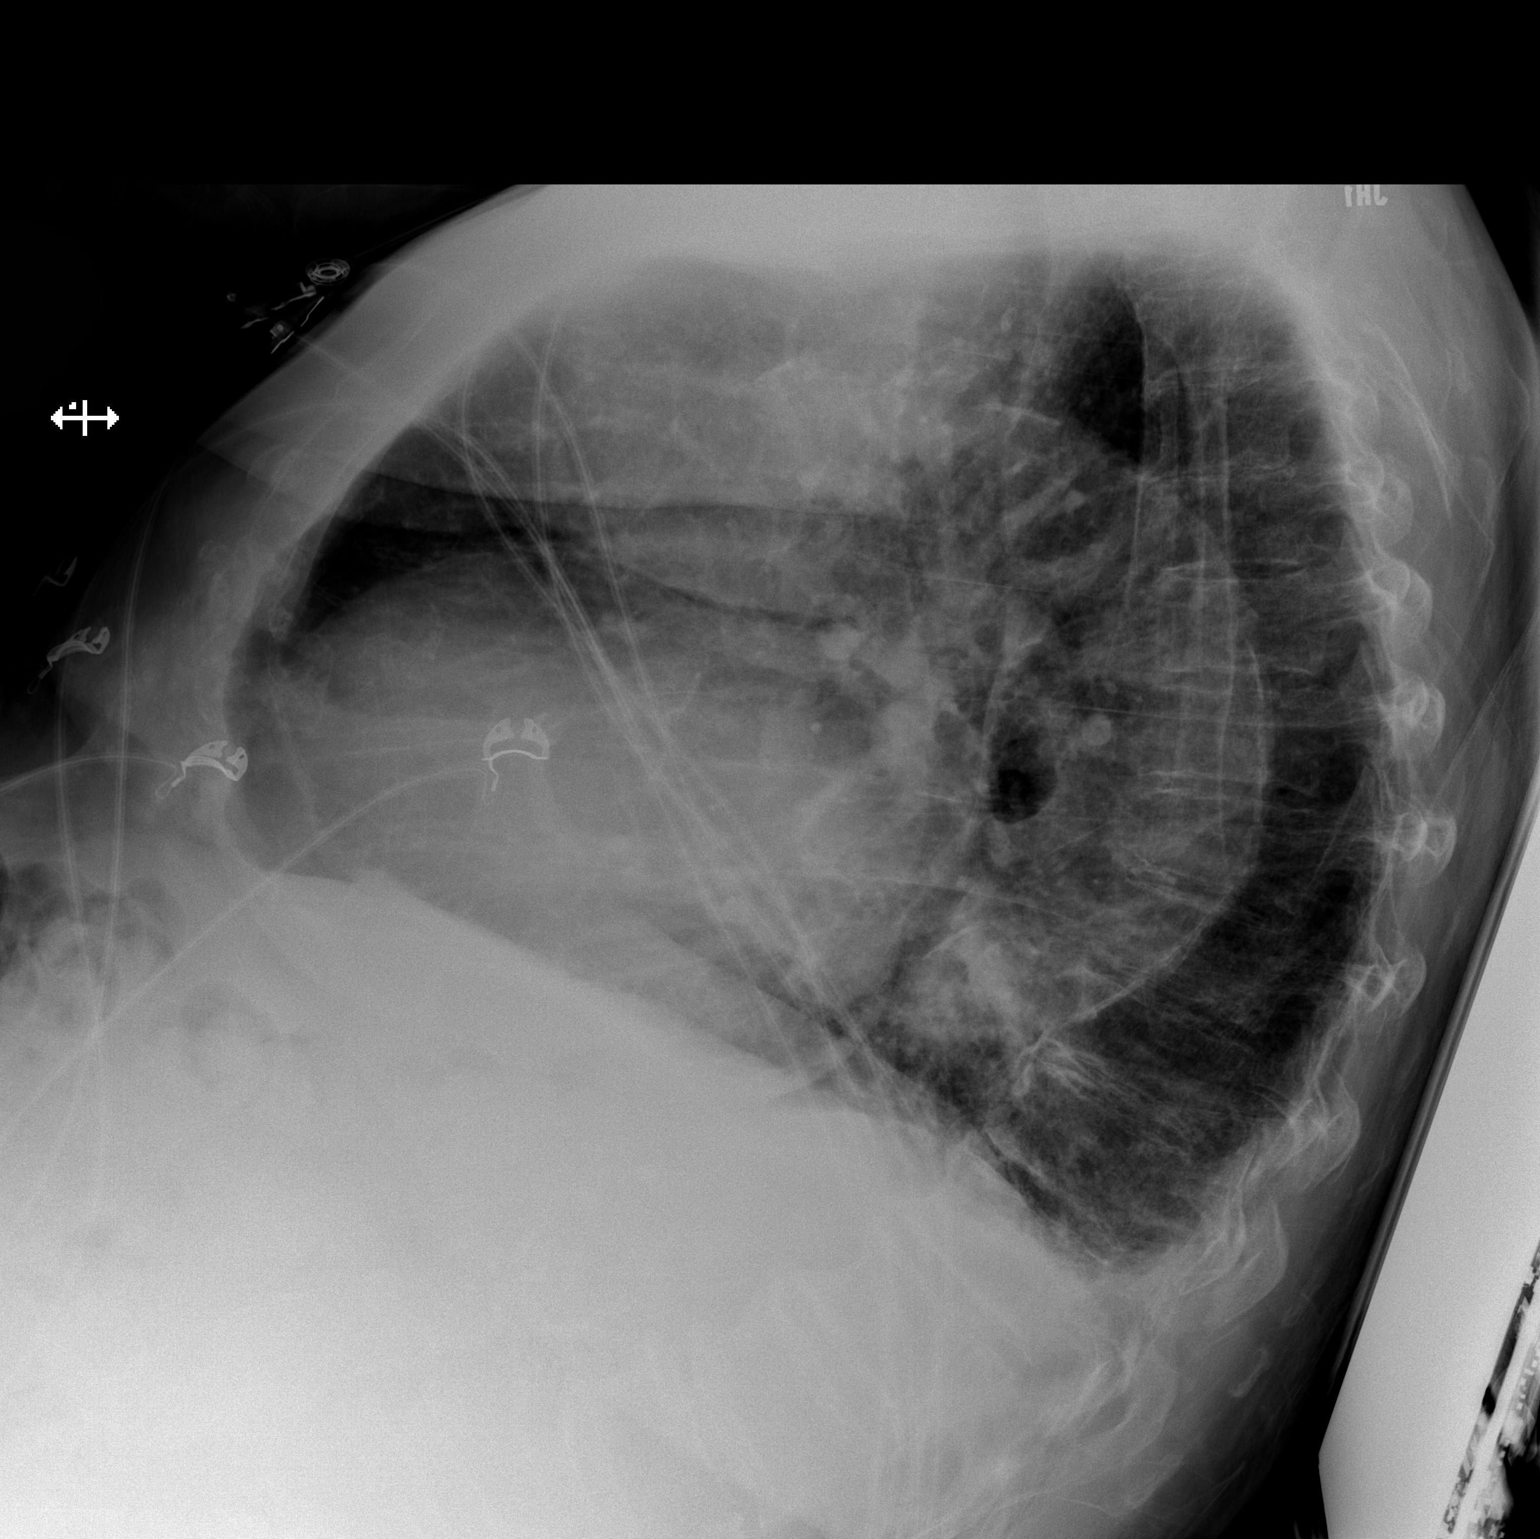

[x chest ap]
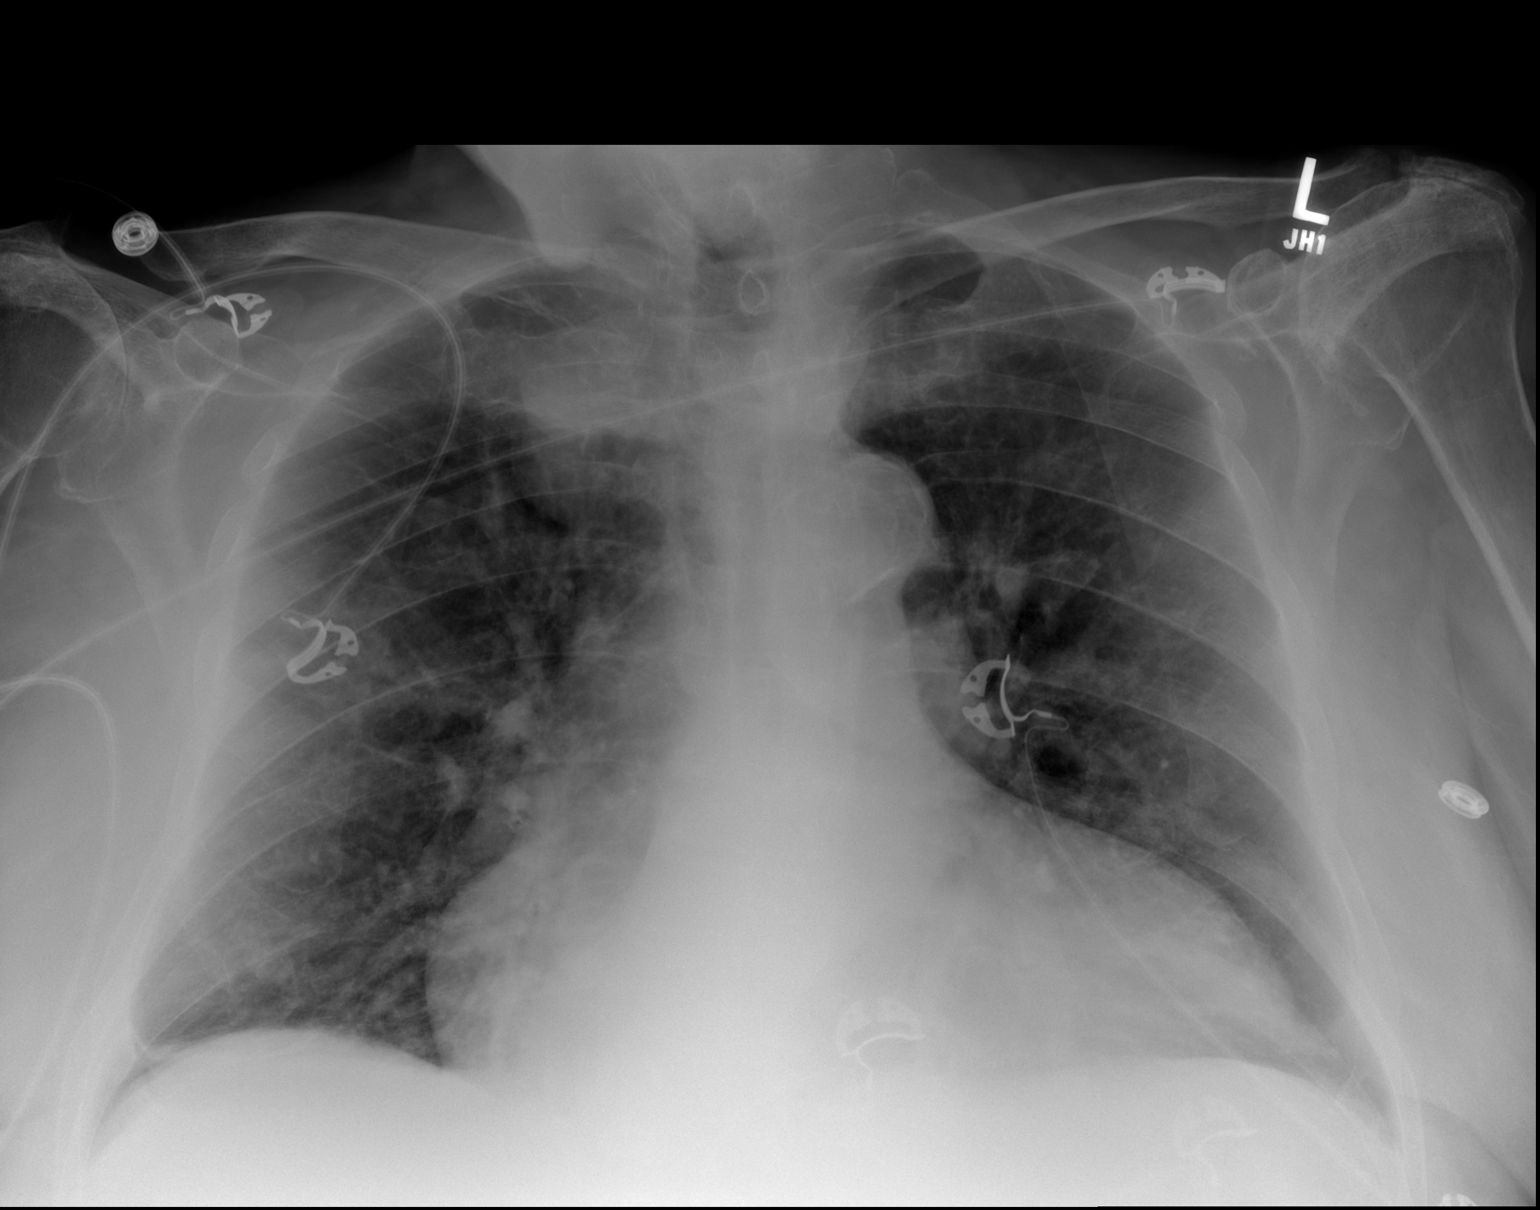

[2 of 2 positions shown; findings below may reference images not displayed]

FINDINGS: In volumes are low. No consolidative airspace disease. No pleural
effusions. Cephalization of the pulmonary vasculature, without frank
pulmonary edema. Enlargement of the cardiopericardial silhouette,
which could reflect underlying cardiomegaly and/or the presence of a
pericardial effusion. Upper mediastinal contours are within normal
limits. Aortic atherosclerosis.
IMPRESSION: 1. Enlarged cardiopericardial silhouette, which could reflect
cardiomegaly and/or presence of pericardial effusion. If, further
evaluation there is clinical concern for pericardial effusion with
echocardiography is suggested.
2. Pulmonary venous congestion, without frank pulmonary edema.
3. Aortic atherosclerosis.
